# Patient Record
Sex: Female | Born: 1955
Health system: Southern US, Community
[De-identification: ages and names within clinical notes are randomized; demographics above are authoritative.]

## PROBLEM LIST (undated history)

## (undated) DIAGNOSIS — G039 Meningitis, unspecified: Secondary | ICD-10-CM

## (undated) DIAGNOSIS — M5416 Radiculopathy, lumbar region: Secondary | ICD-10-CM

## (undated) DIAGNOSIS — J449 Chronic obstructive pulmonary disease, unspecified: Secondary | ICD-10-CM

## (undated) DIAGNOSIS — M549 Dorsalgia, unspecified: Secondary | ICD-10-CM

## (undated) DIAGNOSIS — M5030 Other cervical disc degeneration, unspecified cervical region: Secondary | ICD-10-CM

## (undated) DIAGNOSIS — Z923 Personal history of irradiation: Secondary | ICD-10-CM

## (undated) DIAGNOSIS — I5032 Chronic diastolic (congestive) heart failure: Secondary | ICD-10-CM

## (undated) DIAGNOSIS — I493 Ventricular premature depolarization: Secondary | ICD-10-CM

## (undated) DIAGNOSIS — R0789 Other chest pain: Secondary | ICD-10-CM

## (undated) DIAGNOSIS — R002 Palpitations: Secondary | ICD-10-CM

## (undated) DIAGNOSIS — G8929 Other chronic pain: Secondary | ICD-10-CM

## (undated) DIAGNOSIS — C801 Malignant (primary) neoplasm, unspecified: Secondary | ICD-10-CM

## (undated) DIAGNOSIS — M1990 Unspecified osteoarthritis, unspecified site: Secondary | ICD-10-CM

## (undated) DIAGNOSIS — I491 Atrial premature depolarization: Secondary | ICD-10-CM

## (undated) DIAGNOSIS — I10 Essential (primary) hypertension: Secondary | ICD-10-CM

## (undated) DIAGNOSIS — E785 Hyperlipidemia, unspecified: Secondary | ICD-10-CM

## (undated) DIAGNOSIS — E119 Type 2 diabetes mellitus without complications: Secondary | ICD-10-CM

## (undated) DIAGNOSIS — Z0389 Encounter for observation for other suspected diseases and conditions ruled out: Secondary | ICD-10-CM

## (undated) HISTORY — DX: Chronic diastolic (congestive) heart failure: I50.32

## (undated) HISTORY — PX: ABDOMINAL HYSTERECTOMY: SHX81

## (undated) HISTORY — DX: Ventricular premature depolarization: I49.3

## (undated) HISTORY — DX: Personal history of irradiation: Z92.3

## (undated) HISTORY — DX: Encounter for observation for other suspected diseases and conditions ruled out: Z03.89

## (undated) HISTORY — DX: Atrial premature depolarization: I49.1

---

## 1998-01-11 ENCOUNTER — Emergency Department (HOSPITAL_COMMUNITY): Admission: EM | Admit: 1998-01-11 | Discharge: 1998-01-11 | Payer: Self-pay | Admitting: Emergency Medicine

## 1999-10-01 ENCOUNTER — Encounter: Payer: Self-pay | Admitting: Cardiology

## 1999-10-01 ENCOUNTER — Ambulatory Visit (HOSPITAL_COMMUNITY): Admission: RE | Admit: 1999-10-01 | Discharge: 1999-10-01 | Payer: Self-pay | Admitting: Cardiology

## 1999-10-11 ENCOUNTER — Ambulatory Visit (HOSPITAL_COMMUNITY): Admission: RE | Admit: 1999-10-11 | Discharge: 1999-10-11 | Payer: Self-pay | Admitting: Cardiology

## 1999-10-12 ENCOUNTER — Encounter: Payer: Self-pay | Admitting: Cardiology

## 1999-10-12 ENCOUNTER — Ambulatory Visit (HOSPITAL_COMMUNITY): Admission: RE | Admit: 1999-10-12 | Discharge: 1999-10-12 | Payer: Self-pay | Admitting: Cardiology

## 1999-10-13 ENCOUNTER — Encounter: Payer: Self-pay | Admitting: Cardiology

## 1999-10-19 ENCOUNTER — Other Ambulatory Visit: Admission: RE | Admit: 1999-10-19 | Discharge: 1999-10-19 | Payer: Self-pay | Admitting: Cardiology

## 1999-10-19 ENCOUNTER — Encounter (INDEPENDENT_AMBULATORY_CARE_PROVIDER_SITE_OTHER): Payer: Self-pay | Admitting: Specialist

## 1999-10-19 ENCOUNTER — Encounter: Payer: Self-pay | Admitting: Cardiology

## 1999-10-19 ENCOUNTER — Ambulatory Visit (HOSPITAL_COMMUNITY): Admission: RE | Admit: 1999-10-19 | Discharge: 1999-10-19 | Payer: Self-pay | Admitting: Cardiology

## 1999-11-09 ENCOUNTER — Encounter (INDEPENDENT_AMBULATORY_CARE_PROVIDER_SITE_OTHER): Payer: Self-pay | Admitting: *Deleted

## 1999-11-09 ENCOUNTER — Inpatient Hospital Stay (HOSPITAL_COMMUNITY): Admission: RE | Admit: 1999-11-09 | Discharge: 1999-11-11 | Payer: Self-pay | Admitting: General Surgery

## 1999-11-14 ENCOUNTER — Emergency Department (HOSPITAL_COMMUNITY): Admission: EM | Admit: 1999-11-14 | Discharge: 1999-11-14 | Payer: Self-pay | Admitting: Emergency Medicine

## 1999-11-14 ENCOUNTER — Encounter: Payer: Self-pay | Admitting: Emergency Medicine

## 1999-12-17 ENCOUNTER — Encounter (HOSPITAL_BASED_OUTPATIENT_CLINIC_OR_DEPARTMENT_OTHER): Payer: Self-pay | Admitting: General Surgery

## 1999-12-17 ENCOUNTER — Ambulatory Visit (HOSPITAL_COMMUNITY): Admission: RE | Admit: 1999-12-17 | Discharge: 1999-12-17 | Payer: Self-pay | Admitting: General Surgery

## 2000-04-18 HISTORY — PX: THYROID SURGERY: SHX805

## 2000-08-13 ENCOUNTER — Emergency Department (HOSPITAL_COMMUNITY): Admission: EM | Admit: 2000-08-13 | Discharge: 2000-08-13 | Payer: Self-pay | Admitting: Internal Medicine

## 2000-11-30 ENCOUNTER — Ambulatory Visit (HOSPITAL_COMMUNITY): Admission: RE | Admit: 2000-11-30 | Discharge: 2000-11-30 | Payer: Self-pay | Admitting: Internal Medicine

## 2000-11-30 ENCOUNTER — Encounter: Payer: Self-pay | Admitting: Internal Medicine

## 2001-09-15 ENCOUNTER — Emergency Department (HOSPITAL_COMMUNITY): Admission: EM | Admit: 2001-09-15 | Discharge: 2001-09-15 | Payer: Self-pay | Admitting: *Deleted

## 2001-09-15 ENCOUNTER — Encounter: Payer: Self-pay | Admitting: Emergency Medicine

## 2001-09-18 ENCOUNTER — Inpatient Hospital Stay (HOSPITAL_COMMUNITY): Admission: AD | Admit: 2001-09-18 | Discharge: 2001-09-27 | Payer: Self-pay | Admitting: Internal Medicine

## 2001-09-21 ENCOUNTER — Encounter: Payer: Self-pay | Admitting: Internal Medicine

## 2001-09-23 ENCOUNTER — Encounter: Payer: Self-pay | Admitting: Family Medicine

## 2001-09-24 ENCOUNTER — Encounter (INDEPENDENT_AMBULATORY_CARE_PROVIDER_SITE_OTHER): Payer: Self-pay | Admitting: Specialist

## 2001-09-25 ENCOUNTER — Encounter: Payer: Self-pay | Admitting: Internal Medicine

## 2001-10-04 ENCOUNTER — Encounter: Admission: RE | Admit: 2001-10-04 | Discharge: 2001-10-04 | Payer: Self-pay | Admitting: Internal Medicine

## 2001-10-15 ENCOUNTER — Encounter: Admission: RE | Admit: 2001-10-15 | Discharge: 2001-10-15 | Payer: Self-pay | Admitting: Internal Medicine

## 2001-10-22 ENCOUNTER — Encounter: Admission: RE | Admit: 2001-10-22 | Discharge: 2001-10-22 | Payer: Self-pay | Admitting: Internal Medicine

## 2003-05-14 ENCOUNTER — Emergency Department (HOSPITAL_COMMUNITY): Admission: EM | Admit: 2003-05-14 | Discharge: 2003-05-14 | Payer: Self-pay | Admitting: Emergency Medicine

## 2003-05-22 ENCOUNTER — Encounter: Admission: RE | Admit: 2003-05-22 | Discharge: 2003-05-22 | Payer: Self-pay | Admitting: Nephrology

## 2003-08-04 ENCOUNTER — Encounter: Admission: RE | Admit: 2003-08-04 | Discharge: 2003-08-04 | Payer: Self-pay | Admitting: Orthopedic Surgery

## 2003-08-17 ENCOUNTER — Emergency Department (HOSPITAL_COMMUNITY): Admission: EM | Admit: 2003-08-17 | Discharge: 2003-08-17 | Payer: Self-pay | Admitting: Emergency Medicine

## 2003-08-28 ENCOUNTER — Encounter (HOSPITAL_COMMUNITY): Admission: RE | Admit: 2003-08-28 | Discharge: 2003-09-27 | Payer: Self-pay | Admitting: Orthopedic Surgery

## 2003-10-20 ENCOUNTER — Emergency Department (HOSPITAL_COMMUNITY): Admission: EM | Admit: 2003-10-20 | Discharge: 2003-10-20 | Payer: Self-pay | Admitting: *Deleted

## 2003-10-25 ENCOUNTER — Emergency Department (HOSPITAL_COMMUNITY): Admission: EM | Admit: 2003-10-25 | Discharge: 2003-10-25 | Payer: Self-pay | Admitting: Emergency Medicine

## 2003-11-12 ENCOUNTER — Encounter (HOSPITAL_COMMUNITY): Admission: RE | Admit: 2003-11-12 | Discharge: 2003-12-12 | Payer: Self-pay | Admitting: Orthopedic Surgery

## 2003-12-15 ENCOUNTER — Encounter (HOSPITAL_COMMUNITY): Admission: RE | Admit: 2003-12-15 | Discharge: 2004-01-14 | Payer: Self-pay | Admitting: Orthopedic Surgery

## 2004-07-26 ENCOUNTER — Emergency Department (HOSPITAL_COMMUNITY): Admission: EM | Admit: 2004-07-26 | Discharge: 2004-07-26 | Payer: Self-pay | Admitting: Emergency Medicine

## 2004-08-18 ENCOUNTER — Ambulatory Visit (HOSPITAL_COMMUNITY): Admission: RE | Admit: 2004-08-18 | Discharge: 2004-08-18 | Payer: Self-pay | Admitting: Urology

## 2005-05-12 ENCOUNTER — Emergency Department: Payer: Self-pay | Admitting: General Practice

## 2005-05-14 ENCOUNTER — Inpatient Hospital Stay (HOSPITAL_COMMUNITY): Admission: EM | Admit: 2005-05-14 | Discharge: 2005-05-17 | Payer: Self-pay | Admitting: Emergency Medicine

## 2005-05-16 ENCOUNTER — Ambulatory Visit: Payer: Self-pay | Admitting: Internal Medicine

## 2005-05-18 ENCOUNTER — Ambulatory Visit: Payer: Self-pay | Admitting: Family Medicine

## 2005-05-18 ENCOUNTER — Ambulatory Visit (HOSPITAL_COMMUNITY): Admission: RE | Admit: 2005-05-18 | Discharge: 2005-05-18 | Payer: Self-pay | Admitting: Family Medicine

## 2005-05-20 ENCOUNTER — Emergency Department (HOSPITAL_COMMUNITY): Admission: EM | Admit: 2005-05-20 | Discharge: 2005-05-20 | Payer: Self-pay | Admitting: Emergency Medicine

## 2005-05-26 ENCOUNTER — Ambulatory Visit (HOSPITAL_COMMUNITY): Admission: RE | Admit: 2005-05-26 | Discharge: 2005-05-26 | Payer: Self-pay | Admitting: Family Medicine

## 2005-05-26 ENCOUNTER — Encounter (HOSPITAL_COMMUNITY): Admission: RE | Admit: 2005-05-26 | Discharge: 2005-06-25 | Payer: Self-pay | Admitting: Family Medicine

## 2005-05-30 ENCOUNTER — Ambulatory Visit (HOSPITAL_COMMUNITY): Admission: RE | Admit: 2005-05-30 | Discharge: 2005-05-30 | Payer: Self-pay | Admitting: Family Medicine

## 2005-06-01 ENCOUNTER — Ambulatory Visit: Payer: Self-pay | Admitting: Family Medicine

## 2005-06-14 ENCOUNTER — Ambulatory Visit: Payer: Self-pay | Admitting: Internal Medicine

## 2005-06-22 ENCOUNTER — Ambulatory Visit: Payer: Self-pay | Admitting: Family Medicine

## 2005-06-27 ENCOUNTER — Encounter (HOSPITAL_COMMUNITY): Admission: RE | Admit: 2005-06-27 | Discharge: 2005-07-27 | Payer: Self-pay | Admitting: Family Medicine

## 2005-07-21 ENCOUNTER — Ambulatory Visit: Payer: Self-pay | Admitting: Internal Medicine

## 2005-07-29 ENCOUNTER — Encounter (HOSPITAL_COMMUNITY): Admission: RE | Admit: 2005-07-29 | Discharge: 2005-08-28 | Payer: Self-pay | Admitting: Internal Medicine

## 2005-08-11 ENCOUNTER — Ambulatory Visit: Payer: Self-pay | Admitting: Internal Medicine

## 2005-08-29 ENCOUNTER — Encounter (HOSPITAL_COMMUNITY): Admission: RE | Admit: 2005-08-29 | Discharge: 2005-09-28 | Payer: Self-pay | Admitting: Internal Medicine

## 2005-09-14 ENCOUNTER — Ambulatory Visit: Payer: Self-pay | Admitting: Internal Medicine

## 2005-10-31 ENCOUNTER — Ambulatory Visit: Payer: Self-pay | Admitting: Internal Medicine

## 2005-11-24 ENCOUNTER — Ambulatory Visit: Payer: Self-pay | Admitting: Internal Medicine

## 2005-12-22 ENCOUNTER — Ambulatory Visit: Payer: Self-pay | Admitting: Internal Medicine

## 2005-12-29 ENCOUNTER — Ambulatory Visit (HOSPITAL_COMMUNITY): Admission: RE | Admit: 2005-12-29 | Discharge: 2005-12-29 | Payer: Self-pay | Admitting: Internal Medicine

## 2006-01-18 ENCOUNTER — Ambulatory Visit (HOSPITAL_COMMUNITY): Admission: RE | Admit: 2006-01-18 | Discharge: 2006-01-18 | Payer: Self-pay | Admitting: Internal Medicine

## 2006-05-23 ENCOUNTER — Encounter: Payer: Self-pay | Admitting: Internal Medicine

## 2006-05-23 DIAGNOSIS — I10 Essential (primary) hypertension: Secondary | ICD-10-CM

## 2006-05-23 DIAGNOSIS — E039 Hypothyroidism, unspecified: Secondary | ICD-10-CM

## 2006-05-23 DIAGNOSIS — M1990 Unspecified osteoarthritis, unspecified site: Secondary | ICD-10-CM | POA: Insufficient documentation

## 2006-06-14 ENCOUNTER — Telehealth (INDEPENDENT_AMBULATORY_CARE_PROVIDER_SITE_OTHER): Payer: Self-pay | Admitting: *Deleted

## 2006-08-25 ENCOUNTER — Emergency Department (HOSPITAL_COMMUNITY): Admission: EM | Admit: 2006-08-25 | Discharge: 2006-08-25 | Payer: Self-pay | Admitting: Emergency Medicine

## 2006-10-03 ENCOUNTER — Emergency Department (HOSPITAL_COMMUNITY): Admission: EM | Admit: 2006-10-03 | Discharge: 2006-10-03 | Payer: Self-pay | Admitting: Emergency Medicine

## 2006-10-10 ENCOUNTER — Ambulatory Visit: Payer: Self-pay | Admitting: Internal Medicine

## 2006-10-10 DIAGNOSIS — J13 Pneumonia due to Streptococcus pneumoniae: Secondary | ICD-10-CM | POA: Insufficient documentation

## 2006-10-10 DIAGNOSIS — J9801 Acute bronchospasm: Secondary | ICD-10-CM | POA: Insufficient documentation

## 2006-10-11 ENCOUNTER — Telehealth (INDEPENDENT_AMBULATORY_CARE_PROVIDER_SITE_OTHER): Payer: Self-pay | Admitting: Internal Medicine

## 2006-10-12 ENCOUNTER — Ambulatory Visit: Payer: Self-pay | Admitting: Internal Medicine

## 2007-04-19 ENCOUNTER — Encounter: Payer: Self-pay | Admitting: Family Medicine

## 2007-06-29 ENCOUNTER — Ambulatory Visit: Payer: Self-pay | Admitting: Internal Medicine

## 2007-07-02 ENCOUNTER — Telehealth (INDEPENDENT_AMBULATORY_CARE_PROVIDER_SITE_OTHER): Payer: Self-pay | Admitting: *Deleted

## 2007-07-02 LAB — CONVERTED CEMR LAB
CO2: 24 meq/L (ref 19–32)
Calcium: 9.4 mg/dL (ref 8.4–10.5)
Free T4: 0.8 ng/dL — ABNORMAL LOW (ref 0.89–1.80)
Sodium: 141 meq/L (ref 135–145)
TSH: 4.275 microintl units/mL (ref 0.350–5.50)

## 2007-10-01 ENCOUNTER — Encounter (INDEPENDENT_AMBULATORY_CARE_PROVIDER_SITE_OTHER): Payer: Self-pay | Admitting: Internal Medicine

## 2007-10-28 ENCOUNTER — Emergency Department (HOSPITAL_COMMUNITY): Admission: EM | Admit: 2007-10-28 | Discharge: 2007-10-28 | Payer: Self-pay | Admitting: Emergency Medicine

## 2008-01-14 ENCOUNTER — Encounter (INDEPENDENT_AMBULATORY_CARE_PROVIDER_SITE_OTHER): Payer: Self-pay | Admitting: Internal Medicine

## 2008-01-18 ENCOUNTER — Ambulatory Visit: Payer: Self-pay | Admitting: Internal Medicine

## 2008-01-21 LAB — CONVERTED CEMR LAB
Cholesterol: 143 mg/dL (ref 0–200)
Potassium: 4.1 meq/L (ref 3.5–5.3)
Sodium: 140 meq/L (ref 135–145)
Total CHOL/HDL Ratio: 3.3
Triglycerides: 121 mg/dL (ref <150)
VLDL: 24 mg/dL (ref 0–40)

## 2008-10-28 ENCOUNTER — Emergency Department (HOSPITAL_COMMUNITY): Admission: EM | Admit: 2008-10-28 | Discharge: 2008-10-28 | Payer: Self-pay | Admitting: Emergency Medicine

## 2008-11-21 ENCOUNTER — Ambulatory Visit: Payer: Self-pay | Admitting: Internal Medicine

## 2009-02-09 ENCOUNTER — Emergency Department (HOSPITAL_COMMUNITY): Admission: EM | Admit: 2009-02-09 | Discharge: 2009-02-09 | Payer: Self-pay | Admitting: Emergency Medicine

## 2010-05-18 NOTE — Letter (Signed)
Summary: rpc chart  rpc chart   Imported By: Curtis Sites 01/18/2010 09:47:02  _____________________________________________________________________  External Attachment:    Type:   Image     Comment:   External Document

## 2010-07-18 ENCOUNTER — Emergency Department (HOSPITAL_COMMUNITY): Payer: Self-pay

## 2010-07-18 ENCOUNTER — Emergency Department (HOSPITAL_COMMUNITY)
Admission: EM | Admit: 2010-07-18 | Discharge: 2010-07-18 | Disposition: A | Discharge status: Home / Self Care | Payer: Self-pay | Attending: Emergency Medicine | Admitting: Emergency Medicine

## 2010-07-18 DIAGNOSIS — R51 Headache: Secondary | ICD-10-CM | POA: Insufficient documentation

## 2010-07-18 DIAGNOSIS — Z79899 Other long term (current) drug therapy: Secondary | ICD-10-CM | POA: Insufficient documentation

## 2010-07-18 DIAGNOSIS — I10 Essential (primary) hypertension: Secondary | ICD-10-CM | POA: Insufficient documentation

## 2010-09-03 NOTE — Discharge Summary (Signed)
[facility]  Patient:    Hannah Cooper, Hannah Cooper Visit Number: [id number] MRN: [id number]          Service Type: MED Location: 2A A228 01 Attending Physician:  Cassell Smiles. Dictated by:   Elfredia Nevins, M.D. Admit Date:  09/18/2001 Discharge Date: 09/27/2001                             Discharge Summary  DISCHARGE DIAGNOSIS:  Transfer to [facility] Infectious Disease Department for rule out other pathology (admission diagnosis and discharge diagnosis are aseptic meningitis).  DISCHARGE SUMMARY:  The patient was admitted with severe unrelenting headache, initially evaluated in the emergency department at another institution. She underwent no diagnostic lumbar puncture, presented to the office with persistent increasing headache as well as fever. Initially, she had no focal neurologic signs. She was admitted after I performed a lumbar puncture and found her to have classic cellular pleocytosis consistent with aseptic meningitis. Although she remained stable in the hospital, she eventually developed right-sided twitching and tremulousness by her description although this was not witnessed by a physician. She did respond to analgesics. She was empirically covered with tetracycline antibiotic for possibility of RMSF, although doubtful. Due to lack of rapid improvement and persistent fever spikes as well as headache, she was subsequently discussed in consultation with Dr. Darlina Sicilian, infectious disease, in [employer] Transfer was subsequently accomplished for infectious disease evaluation.  DISPOSITION:  On the day of discharge, she was neurologically and hemodynamically stable. Dictated by:   Elfredia Nevins, M.D. Attending Physician:  Cassell Smiles DD:  10/16/01 TD:  10/17/01 Job: [id number]
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
## 2010-09-03 NOTE — H&P (Signed)
[facility]  Patient:    Hannah Cooper, Hannah Cooper Visit Number: [id number] MRN: [id number]          Service Type: MED Location: 2A A228 01 Attending Physician:  Cassell Smiles. Dictated by:   Elfredia Nevins, M.D. Admit Date:  09/18/2001                           History and Physical  DATE OF BIRTH:  1955-06-27  CHIEF COMPLAINT:  Severe headache.  HISTORY OF PRESENT ILLNESS:  Approximately four or five days prior to office visitation the patient developed a gradually increasing severe headache.  It has been accompanied by nausea and vomiting.  No abdominal pain.  She has had dizziness, lightheadedness, and now thirst since she is unable to take any significant fluid or solid intake.  She has had no hematemesis, hematochezia, or melena.  No skin rash.  She does frequent the outdoors; however, there was no notable tick bite.  She visited [facility] Emergency Department on Saturday and underwent a computerized tomogram of the brain which was reportedly negative.  I will review that report when it is available.  She also had blood work obtained which was reportedly normal.  Her presumptive diagnosis given at the time of discharge, according to patient history, is a viral syndrome. Specifically, no lumbar puncture was performed.  They told her definitely there was no evidence of hemorrhage.  She persisted in her symptoms and presents to the office with no improvement and, in fact, possibly some worsening of symptoms.  She does admit to photophobia.  Her neck does hurt with flexion.  PAST MEDICAL HISTORY:   Status post partial hysterectomy.  SOCIAL HISTORY:  Is a Therapist, nutritional.  No smoking or alcohol use.  No substance abuse.  FAMILY HISTORY:  Positive for hypertension.  Mother deceased with polio, myocardial infarction, and hypertension.  Carcinoma in paternal grandparents. Maternal grandmother has coronary artery disease.  Spouse has  diabetes mellitus.  She has one sister who has hypertension as well.  REVIEW OF SYSTEMS:  Under HPI.  All else is negative.  PHYSICAL EXAMINATION:  GENERAL:  Moderately photophobic and appears toxic.  HEENT, NECK:  No JVD or adenopathy.  Her neck shows subjective stiffness, but no true nuchal rigidity.  No adenopathy.  No masses.  Oropharyngeal exam shows dry mucous membranes, injected oropharynx without enanthem.  CHEST:  Clear.  CARDIAC:  Regular rhythm without murmur, gallop, or rub.  ABDOMEN:  Soft in all quadrants.  No organomegaly or masses.  EXTREMITIES:  Without clubbing, cyanosis, or edema.  NEUROLOGIC:  Other than her photophobia, shows no focal findings.  IMPRESSION AND PLAN:  The patient has an unrelenting headache suggestive of viral meningitis.  She will be admitted for hydration, adequate analgesia, and control of her pain.  I believe at this point a lumbar puncture is indicated. [facility] spotted fever serology will be drawn.  Laboratories from the previous hospital visitation at [facility] will be reviewed. Dictated by:   Elfredia Nevins, M.D. Attending Physician:  Cassell Smiles DD:  09/18/01 TD:  09/19/01 Job: [id number]

## 2010-09-03 NOTE — Consult Note (Signed)
[facility]  Patient:    GODDESS, GEBBIA Visit Number: [id number] MRN: [id number]          Service Type: MED Location: 2A A228 01 Attending Physician:  Cassell Smiles. Dictated by:   Beryle Beams, M.D. Admit Date:  09/18/2001                            Consultation Report  DATE OF BIRTH:  May 08, 1955  IMPRESSION:  Clinically, the patient is most consistent with viral aseptic meningitis.  Other potential etiologies to consider includes cryptococcal meningitis.  HISTORY OF PRESENT ILLNESS:  The patient is a 55 year old lady who developed a moderate headache about 6 days ago.  She went to bed and woke up the next morning with severe headaches.  She attempted to continue with her activities but was not feeling well.  She also reported having chest pain and eventually developed a fever of 101.  She was seen at [facility] Emergency Department, had a computed tomography of the brain apparently it was negative and was discharged with pain medications and diagnosis of a viral syndrome and apparently no lumbar puncture was done.  She went to see her primary care physician and was admitted here for further evaluation.  A spinal tap was carried out and showed what was positive for high wbcs indicating meningitis. The result of a CT scan done at [facility] has been apparently not available. The results of the spinal tap showed tube #1 wbcs of 776, tube #4 wbcs of 1333.  The wbcs in tube #1 was 57 and tube #2 is 256.  Neutrophils differential in #1 was 3%, #4 - 1%.  The lymphocytes were 91-92% and also the monocytes were 5 and 6%.  Glucose was 42, protein 85.  The glucose that was done around that time was 138.  Gram stain showed no organism, moderate wbcs were seen.  She did have titers for [facility] spotted fever which apparently are pending at this time.  She complains of severe headaches 10/10 which are relieved with pain medication to  about 7/10.  She complains of severe photophobia.  PAST MEDICAL HISTORY:  Status post partial hysterectomy.  History of thyroid disease, she is status post thyroidectomy - she apparently is on steroid medications for this condition.  Also taking a fluid medication.  SOCIAL HISTORY:  She works as a Engineering geologist.  No substance abuse.  No alcohol or tobacco use.  FAMILY HISTORY:  Hypertension, history of myocardial infarction.  Also history of carcinoma in the grandparents.  REVIEW OF SYSTEMS:  As stated above.  PHYSICAL EXAMINATION:  Moderately overweight lady who is in obvious discomfort from headaches, the lights are out, has a towel around her head.  She is awake and oriented.  She has coherent speech.  Language and cognition are intact. Cranial nerve reveals that pupils are equal, round, reactive to light and accommodation.  Extraocular movements are full.  Visual fields are full. Facial muscle strength is normal.  Tongue and uvula are both midline. Shoulder shrugs are normal.  Visual fields are intact.  Motor examination shows normal tone, bulk, and strength.  There is no pronator drift. Coordination is intact.  Reflexes are +2.  Plantar reflexes both downgoing. Sensory examination normal to temperature, light touch, and double simultaneous stimulation.  Gait is not tested.  Thanks for this consultation. Dictated by:   Beryle Beams, M.D. Attending Physician:  Cassell Smiles DD:  09/20/01 TD:  09/23/01 Job: [id number]
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
## 2010-09-03 NOTE — Op Note (Signed)
[place]. [facility]  Patient:    Hannah Cooper, Hannah Cooper                    MRN: [id number] Proc. Date: 11/09/99 Attending:  Luisa Hart L. Lurene Shadow, M.D. CC:         Mardene Celeste. Lurene Shadow, M.D. (2)                           Operative Report  PREOPERATIVE DIAGNOSIS:  Left thyroid nodule.  POSTOPERATIVE DIAGNOSIS:  Multinodular goiter.  OPERATION PERFORMED:  Near total thyroidectomy.  SURGEON:  Mardene Celeste. Lurene Shadow, M.D.  ASSISTANT:  Marnee Spring. Wiliam Ke, M.D.  ANESTHESIA:  General.  INDICATIONS FOR PROCEDURE:  The patient is a 55 year old woman presenting with a very large left thyroid nodule, no associated hypothyroidism, who was brought to the operating room after scan showing a cold nodule to the left lobe of the thyroid.  DESCRIPTION OF PROCEDURE:  Following induction of satisfactory general anesthesia with the patient positioned supinely, the head and neck hyperextended, the neck was prepped and draped to be included in a sterile operative field.  A collar incision placed two fingerbreadths above the sternal notch was deepened through the skin and subcutaneous tissues and carried to the platysma down to the strap muscles.  The strap muscles were opened in the midline and dissection carried down to the thyroid.  The left thyroid was approached and explored and dissection carried down to the inferior pole of the thyroid where the recurrent laryngeal nerve was positively identified and protected throughout the course of dissection. Dissection was carried up along the thyroid staying close onto the capsule and avoiding the recurrent laryngeal nerve all the way up to the upper pole of the thyroid.  The upper pole vessels were doubly clamped and transected and the thyroid was then dissected free including the isthmus and pyramidal lobe over to the right side.  The thyroid was then transected between clamps and secured with ties of 3-0 Vicryl.  The thyroid left lobe and  isthmus were forwarded for pathologic evaluation.  The pathologic report showed multinodular goiter.  The right thyroid lobe was palpated and there were multiple nodules noted within the right thyroid lobe and the right thyroid lobe was then dissected by taking down some of the ____________ vessels and releasing the upper pole and dissecting the right thyroid from the midline towards the right.  The recurrent laryngeal nerve on the right was avoided and the thyroid as well as the parathyroids on the right side were avoided and partial lobectomy leaving a rim of thyroid over the recurrent laryngeal nerve and the parathyroids was carried out and the thyroid secured with suture ligatures of 3-0 Vicryl. This portion of the specimen was forwarded for permanent sections.  Sponge, instrument and sharp counts were verified.  The wounds thoroughly irrigated with saline.  All areas of dissection checked for hemostasis and noted to be excellent.  The midline strap muscles were then closed with a running suture of 3-0 Vicryl.  The platysma and subcutaneous tissues were closed with interrupted 3-0 Vicryl sutures and the skin closed with running 5-0 Monocryl. The wound was then reinforced with Steri-Strips.  Sterile dressings applied. Anesthetic reversed.  Patient removed from the operating room to the recovery room in stable condition having tolerated the procedure well. DD:  11/09/99 TD:  11/10/99 Job: [id number]

## 2011-01-13 LAB — STREP A DNA PROBE

## 2011-01-22 ENCOUNTER — Observation Stay (HOSPITAL_COMMUNITY)
Admission: EM | Admit: 2011-01-22 | Discharge: 2011-01-24 | Disposition: A | Discharge status: Home / Self Care | Payer: Medicaid Other | Attending: Internal Medicine | Admitting: Internal Medicine

## 2011-01-22 ENCOUNTER — Encounter: Payer: Self-pay | Admitting: Emergency Medicine

## 2011-01-22 DIAGNOSIS — E039 Hypothyroidism, unspecified: Secondary | ICD-10-CM

## 2011-01-22 DIAGNOSIS — J9801 Acute bronchospasm: Secondary | ICD-10-CM

## 2011-01-22 DIAGNOSIS — I10 Essential (primary) hypertension: Secondary | ICD-10-CM

## 2011-01-22 DIAGNOSIS — A879 Viral meningitis, unspecified: Principal | ICD-10-CM

## 2011-01-22 DIAGNOSIS — R112 Nausea with vomiting, unspecified: Secondary | ICD-10-CM | POA: Insufficient documentation

## 2011-01-22 DIAGNOSIS — R1110 Vomiting, unspecified: Secondary | ICD-10-CM

## 2011-01-22 DIAGNOSIS — J13 Pneumonia due to Streptococcus pneumoniae: Secondary | ICD-10-CM

## 2011-01-22 DIAGNOSIS — M1990 Unspecified osteoarthritis, unspecified site: Secondary | ICD-10-CM

## 2011-01-22 DIAGNOSIS — R51 Headache: Secondary | ICD-10-CM

## 2011-01-22 DIAGNOSIS — IMO0001 Reserved for inherently not codable concepts without codable children: Secondary | ICD-10-CM | POA: Insufficient documentation

## 2011-01-22 HISTORY — DX: Meningitis, unspecified: G03.9

## 2011-01-22 LAB — URINALYSIS, ROUTINE W REFLEX MICROSCOPIC
Bilirubin Urine: NEGATIVE
Hgb urine dipstick: NEGATIVE
Protein, ur: NEGATIVE mg/dL
Urobilinogen, UA: 0.2 mg/dL (ref 0.0–1.0)

## 2011-01-22 LAB — BASIC METABOLIC PANEL
Calcium: 9.3 mg/dL (ref 8.4–10.5)
GFR calc non Af Amer: >90 mL/min (ref >90)
Glucose, Bld: 92 mg/dL (ref 70–99)
Sodium: 138 mEq/L (ref 135–145)

## 2011-01-22 LAB — CSF CELL COUNT WITH DIFFERENTIAL
Eosinophils, CSF: 0 % (ref 0–1)
Monocyte-Macrophage-Spinal Fluid: 11 % — ABNORMAL LOW (ref 15–45)
RBC Count, CSF: 10 /mm3 — ABNORMAL HIGH
Tube #: 1
Tube #: 4
WBC, CSF: 1 /mm3 (ref 0–5)

## 2011-01-22 LAB — CBC
MCH: 29.7 pg (ref 26.0–34.0)
MCHC: 33.4 g/dL (ref 30.0–36.0)
MCV: 88.8 fL (ref 78.0–100.0)
Platelets: 258 10*3/uL (ref 150–400)
RBC: 4.18 MIL/uL (ref 3.87–5.11)
RDW: 13.4 % (ref 11.5–15.5)

## 2011-01-22 LAB — DIFFERENTIAL
Basophils Relative: 0 % (ref 0–1)
Eosinophils Absolute: 0.1 10*3/uL (ref 0.0–0.7)
Eosinophils Relative: 1 % (ref 0–5)
Lymphs Abs: 1.7 10*3/uL (ref 0.7–4.0)

## 2011-01-22 LAB — GRAM STAIN

## 2011-01-22 MED ORDER — SENNA 8.6 MG PO TABS: 2.0000 | ORAL_TABLET | Freq: Every day | ORAL | Status: DC | PRN

## 2011-01-22 MED ORDER — LIDOCAINE HCL (PF) 2 % IJ SOLN
INTRAMUSCULAR | Status: AC
  Filled 2011-01-22: qty 10

## 2011-01-22 MED ORDER — PROMETHAZINE HCL 12.5 MG PO TABS: 12.5000 mg | ORAL_TABLET | Freq: Four times a day (QID) | ORAL | Status: DC | PRN

## 2011-01-22 MED ORDER — PROMETHAZINE HCL 25 MG/ML IJ SOLN
12.5000 mg | Freq: Once | INTRAMUSCULAR | Status: AC
  Administered 2011-01-22: 12.5 mg via INTRAVENOUS
  Filled 2011-01-22: qty 1

## 2011-01-22 MED ORDER — BISACODYL 10 MG RE SUPP: 10.0000 mg | Freq: Every day | RECTAL | Status: DC | PRN

## 2011-01-22 MED ORDER — ONDANSETRON HCL 4 MG/2ML IJ SOLN
INTRAMUSCULAR | Status: AC
  Administered 2011-01-22: 4 mg
  Filled 2011-01-22: qty 2

## 2011-01-22 MED ORDER — ONDANSETRON HCL 4 MG/2ML IJ SOLN
4.0000 mg | Freq: Once | INTRAMUSCULAR | Status: AC
  Administered 2011-01-22: 4 mg via INTRAVENOUS
  Filled 2011-01-22: qty 2

## 2011-01-22 MED ORDER — MORPHINE SULFATE 4 MG/ML IJ SOLN
4.0000 mg | Freq: Once | INTRAMUSCULAR | Status: AC
  Administered 2011-01-22: 4 mg via INTRAVENOUS
  Filled 2011-01-22: qty 1

## 2011-01-22 MED ORDER — KCL IN DEXTROSE-NACL 20-5-0.9 MEQ/L-%-% IV SOLN
INTRAVENOUS | Status: DC
  Administered 2011-01-22 – 2011-01-23 (×2): via INTRAVENOUS

## 2011-01-22 MED ORDER — MORPHINE SULFATE 2 MG/ML IJ SOLN
4.0000 mg | INTRAMUSCULAR | Status: DC | PRN
  Administered 2011-01-22: 4 mg via INTRAVENOUS
  Filled 2011-01-22: qty 2

## 2011-01-22 MED ORDER — ALUM & MAG HYDROXIDE-SIMETH 200-200-20 MG/5ML PO SUSP: 30.0000 mL | Freq: Four times a day (QID) | ORAL | Status: DC | PRN

## 2011-01-22 MED ORDER — PROMETHAZINE HCL 25 MG/ML IJ SOLN: 12.5000 mg | Freq: Four times a day (QID) | INTRAMUSCULAR | Status: DC | PRN

## 2011-01-22 MED ORDER — ACETAMINOPHEN 650 MG RE SUPP: 650.0000 mg | Freq: Four times a day (QID) | RECTAL | Status: DC | PRN

## 2011-01-22 MED ORDER — ACETAMINOPHEN 325 MG PO TABS
650.0000 mg | ORAL_TABLET | Freq: Four times a day (QID) | ORAL | Status: DC | PRN
  Filled 2011-01-22: qty 2

## 2011-01-22 MED ORDER — SODIUM CHLORIDE 0.9 % IV BOLUS (SEPSIS): 1000.0000 mL | Freq: Once | INTRAVENOUS | Status: DC

## 2011-01-22 MED ORDER — HYDROMORPHONE HCL 1 MG/ML IJ SOLN
1.0000 mg | Freq: Once | INTRAMUSCULAR | Status: AC
  Administered 2011-01-22: 1 mg via INTRAVENOUS
  Filled 2011-01-22: qty 1

## 2011-01-22 NOTE — ED Notes (Signed)
Assisted with lumbar puncture--Pt. Tolerated well--Specimens collected and hand delivered to lab.

## 2011-01-22 NOTE — ED Notes (Addendum)
B/P 156/70  HR 58  P.O. 98% on room air, Oral Temp 99--C/o headache and rates pain 6 on 1-10 scale.

## 2011-01-22 NOTE — ED Notes (Signed)
B/P retaken 158/80

## 2011-01-22 NOTE — ED Notes (Signed)
Hospitalist here and in to see pt.

## 2011-01-22 NOTE — ED Notes (Signed)
Taken to Room 341 via stretcher.  Stable

## 2011-01-22 NOTE — ED Notes (Signed)
Report called to Karen--Pt. Will be going to Room 341.

## 2011-01-22 NOTE — ED Notes (Signed)
Sleeping on carrier---awakens with verbal and reports much relief of headache pain---awaiting lab results.

## 2011-01-22 NOTE — ED Notes (Signed)
Pt c/o fever, headache and body aches x 3 days.

## 2011-01-22 NOTE — ED Notes (Signed)
C/o nausea with vomiting after she tried to sit up.  Order for Zofran rcd and given.

## 2011-01-22 NOTE — ED Notes (Signed)
Continues to sleep--awakens easily.  Oral temp retaken and is now 98.2

## 2011-01-22 NOTE — H&P (Addendum)
Hospital Admission Note Date: 01/22/2011  Patient name: Hannah Cooper Medical record number: [id number] Date of birth: 05-Nov-1955 Age: 55 y.o. Gender: female PCP: None  Attending physician: Crista Curb Chief Complaint: Headache fevers body aches  History of Present Illness: Hannah Cooper is an 55 y.o. female who presents with a three-day history of worsening holocephalic headache, subjective fevers and chills, myalgias. Initially she thought it was a flulike illness but her headache has worsened so she came to the emergency room. She has had similar symptoms and was admitted and diagnosed with aseptic meningitis in 2003. At that time she had a negative HIV test. She has also had photophobia. She's had no cough, rash, tick bites, recent travel, sick contacts. She has no sinus congestion or postnasal drip. She's had no sore throat or rhinorrhea. She received morphine and subsequently Dilaudid. It was after the Dilaudid that she had intractable vomiting. She's had no diarrhea. She had a lumbar puncture which showed elevated protein, normal glucose, white blood cell of 6 and some red blood cells.  Past Medical History  Diagnosis Date  . Meningitis    Meds: Eyedrops for dry eyes. She does not know the name  Allergies: Review of patient's allergies indicates no known allergies.  Social history: Patient does not drink smoke or use drugs. She recently adopted a family of 3 young children and has 2 grown set of twins. She does not work.  Past Surgical History  Procedure Date  . Abdominal hysterectomy    Review of Systems: Systems reviewed and as above otherwise negative  Physical Exam: BP 154/98  Pulse 72  Temp(Src) 99.3 F (37.4 C) (Oral)  Resp 20  Ht 5\' 7"  (1.702 m)  Wt 86.183 kg (190 lb)  BMI 29.76 kg/m2  SpO2 100%  General Appearance:   uncomfortable appearing in a darkened room. Vomiting bilious emesis. Oriented and appropriate.   Head:    Normocephalic, without  obvious abnormality, atraumatic  Eyes:    PERRL, conjunctiva/corneas clear, EOM's intact, fundi    benign, both eyes  Ears:    Normal TM's and external ear canals, both ears  Nose:   Nares normal, septum midline, mucosa normal, no drainage    or sinus tenderness  Throat:   Lips, mucosa, and tongue normal; teeth and gums normal  Neck:   slight meningismus. No lymphadenopathy no thyromegaly   Back:     Symmetric, no curvature, ROM normal, no CVA tenderness  Lungs:     Clear to auscultation bilaterally, respirations unlabored  Chest Wall:    No tenderness or deformity   Heart:    Regular rate and rhythm, S1 and S2 normal, no murmur, rub   or gallop  Breast Exam:    No tenderness, masses, or nipple abnormality  Abdomen:     Soft, non-tender, bowel sounds active all four quadrants,    no masses, no organomegaly  Genitalia:   deferred   Rectal:   deferred   Extremities:   Extremities normal, atraumatic, no cyanosis or edema  Pulses:   2+ and symmetric all extremities  Skin:   Skin color, texture, turgor normal, no rashes or lesions  Lymph nodes:   Cervical, supraclavicular, and axillary nodes normal  Neurologic:   CNII-XII intact, normal strength, sensation and reflexes    throughout   Lab results: Basic Metabolic Panel:  Basename 01/22/11 1020  NA 138  K 4.4  CL 103  CO2 26  GLUCOSE 92  BUN 9  CREATININE  0.74  CALCIUM 9.3  MG --  PHOS --   Liver Function Tests: No results found for this basename: AST:2,ALT:2,ALKPHOS:2,BILITOT:2,PROT:2,ALBUMIN:2 in the last 72 hours No results found for this basename: LIPASE:2,AMYLASE:2 in the last 72 hours No results found for this basename: AMMONIA:2 in the last 72 hours CBC:  Basename 01/22/11 1020  WBC 4.7  NEUTROABS 2.5  HGB 12.4  HCT 37.1  MCV 88.8  PLT 258   Tube #1 of CSF showed 10 red blood cells, 50 glucose, 72 total protein, 1 white blood cell. It was clear and colorless.  CSF Gram stain shows white blood cells, culture  pending  UA was negative with a specific gravity of 1.020  Assessment & Plan: Principal Problem:  *Viral meningitis Active Problems:  Vomiting  Patient will be placed on observation. I suspect that she had a traumatic tap and possibly her CSF tubes were reversed. However, I will order an HSV PCR. She has no encephalopathy and my suspicion for HSV encephalitis is low. This is most likely viral meningitis and will require supportive care. I will however repeat an HIV test. I will change her pain medication to morphine as this caused less vomiting. Change antiemetics to Phenergan. Clear liquids for now.  Hannah Cooper L 01/22/2011, 4:29 PM

## 2011-01-22 NOTE — ED Notes (Signed)
Patient moved to room 10 for LP. LP tray set up per EDP.

## 2011-01-22 NOTE — ED Notes (Signed)
Sleeping on carrier--awakens with verbal.

## 2011-01-22 NOTE — ED Notes (Signed)
Reports feeling less nauseated and is resting with lights off.  Family members advised of airborne precautions and in to see pt.

## 2011-01-23 LAB — HIV ANTIBODY (ROUTINE TESTING W REFLEX): HIV: NONREACTIVE

## 2011-01-23 MED ORDER — PROMETHAZINE HCL 12.5 MG PO TABS
12.5000 mg | ORAL_TABLET | Freq: Four times a day (QID) | ORAL | Status: DC | PRN
  Administered 2011-01-23: 12.5 mg via ORAL
  Filled 2011-01-23: qty 1

## 2011-01-23 MED ORDER — ACETAMINOPHEN 160 MG/5ML PO SOLN
650.0000 mg | Freq: Four times a day (QID) | ORAL | Status: DC | PRN
  Administered 2011-01-23: 650 mg via ORAL
  Filled 2011-01-23: qty 20.3

## 2011-01-23 MED ORDER — OXYCODONE HCL 20 MG/ML PO CONC
5.0000 mg | ORAL | Status: DC | PRN
  Administered 2011-01-23 – 2011-01-24 (×2): 10 mg via ORAL
  Filled 2011-01-23 (×2): qty 1

## 2011-01-23 MED ORDER — ONDANSETRON HCL 4 MG/2ML IJ SOLN
4.0000 mg | Freq: Four times a day (QID) | INTRAMUSCULAR | Status: DC | PRN
  Administered 2011-01-23: 4 mg via INTRAVENOUS
  Filled 2011-01-23: qty 2

## 2011-01-23 NOTE — Progress Notes (Signed)
Still nauseated and with headache. Hold off on discharge today.

## 2011-01-23 NOTE — Progress Notes (Addendum)
Subjective: The patient was doing very well until just this morning. She has not had any pain medication since last night. Her headache was gone. Her nausea resolved until just now. Now her headache has returned and she had emesis after eating pancakes. The photophobia has improved. Her neck stiffness is improved.  Objective: Vital signs in last 24 hours: Filed Vitals:   01/22/11 1757 01/22/11 1900 01/22/11 2200 01/23/11 0600  BP: 155/76 166/82 160/90 153/95  Pulse: 58 73 71 64  Temp: 99.5 F (37.5 C) 99 F (37.2 C) 98.9 F (37.2 C) 98.2 F (36.8 C)  TempSrc: Oral Oral Oral Oral  Resp: 20 20 16 16   Height:      Weight:      SpO2: 98% 96% 96% 100%   Weight change:   Intake/Output Summary (Last 24 hours) at 01/23/11 1051 Last data filed at 01/23/11 0634  Gross per 24 hour  Intake   1473 ml  Output      0 ml  Net   1473 ml   General: She appears more comfortable today. The shades are open today. Neck: More supple today Cardiovascular regular rate rhythm without murmurs gallops rubs Lungs clear to auscultation bilaterally without wheeze rhonchi or rales Extremities no clubbing cyanosis or edema Abdomen soft nontender nondistended  Lab Results: Basic Metabolic Panel:  Lab 01/22/11 1610  NA 138  K 4.4  CL 103  CO2 26  GLUCOSE 92  BUN 9  CREATININE 0.74  CALCIUM 9.3  MG --  PHOS --   Liver Function Tests: No results found for this basename: AST:2,ALT:2,ALKPHOS:2,BILITOT:2,PROT:2,ALBUMIN:2 in the last 168 hours No results found for this basename: LIPASE:2,AMYLASE:2 in the last 168 hours No results found for this basename: AMMONIA:2 in the last 168 hours CBC:  Lab 01/22/11 1020  WBC 4.7  NEUTROABS 2.5  HGB 12.4  HCT 37.1  MCV 88.8  PLT 258   Cardiac Enzymes: No results found for this basename: CKTOTAL:3,CKMB:3,CKMBINDEX:3,TROPONINI:3 in the last 168 hours BNP: No results found for this basename: POCBNP:3 in the last 168 hours D-Dimer: No results found for  this basename: DDIMER:2 in the last 168 hours CBG: No results found for this basename: GLUCAP:6 in the last 168 hours Hemoglobin A1C: No results found for this basename: HGBA1C in the last 168 hours Fasting Lipid Panel: No results found for this basename: CHOL,HDL,LDLCALC,TRIG,CHOLHDL,LDLDIRECT in the last 960 hours Thyroid Function Tests: No results found for this basename: TSH,T4TOTAL,FREET4,T3FREE,THYROIDAB in the last 168 hours Anemia Panel: No results found for this basename: VITAMINB12,FOLATE,FERRITIN,TIBC,IRON,RETICCTPCT in the last 168 hours Alcohol Level: No results found for this basename: ETH:2 in the last 168 hours   Micro Results: Recent Results (from the past 240 hour(s))  CSF CULTURE     Status: Normal (Preliminary result)   Collection Time   01/22/11 12:00 PM      Component Value Range Status Comment   Specimen Description CSF   Final    Special Requests Normal   Final    Gram Stain     Final    Value: CYTOSPIN SLIDE WBC PRESENT,BOTH PMN AND MONONUCLEAR     NO ORGANISMS SEEN   Culture PENDING   Incomplete    Report Status PENDING   Incomplete   GRAM STAIN     Status: Normal   Collection Time   01/22/11 12:00 PM      Component Value Range Status Comment   Specimen Description CSF   Final    Special Requests Normal  Final    Gram Stain     Final    Value: WBC SEEN WBC PRESENT, PREDOMINANTLY MONONUCLEAR     Gram Stain Report Called to,Read Back By and Verified With: BOACHBURN, C AT 13:41 ON 01/22/11 BY PRUITT,C.   Report Status 01/22/2011 FINAL   Final    Studies/Results: No results found.  Scheduled Meds:   .  HYDROmorphone (DILAUDID) injection  1 mg Intravenous Once  . lidocaine      . ondansetron      . promethazine  12.5 mg Intravenous Once  . DISCONTD: sodium chloride  1,000 mL Intravenous Once   Continuous Infusions:   . DISCONTD: dextrose 5 % and 0.9 % NaCl with KCl 20 mEq/Cooper 100 mL/hr at 01/23/11 0634   PRN Meds:.acetaminophen (TYLENOL) oral  liquid 160 mg/5 mL, alum & mag hydroxide-simeth, bisacodyl, oxyCODONE, promethazine, senna, DISCONTD: acetaminophen, DISCONTD: acetaminophen, DISCONTD: morphine, DISCONTD: promethazine, DISCONTD: promethazine Assessment/Plan: Principal Problem:  *Viral meningitis Active Problems:  Vomiting  Somewhat improved. Patient anxious to go home. I will stop her IV fluids and change her antiemetics and pain medication to by mouth. If she is able to keep down fluids and her nausea and pain is fairly well-controlled she could potentially go home later on today.  LOS: 1 day   Hannah Cooper 01/23/2011, 10:51 AM

## 2011-01-24 LAB — HERPES SIMPLEX VIRUS(HSV) DNA BY PCR: HSV 1 DNA: NOT DETECTED

## 2011-01-24 MED ORDER — ACETAMINOPHEN 160 MG/5ML PO SOLN: 650.0000 mg | Freq: Four times a day (QID) | ORAL | Status: AC | PRN

## 2011-01-24 MED ORDER — PROMETHAZINE HCL 12.5 MG PO TABS: 12.5000 mg | ORAL_TABLET | Freq: Four times a day (QID) | ORAL | Status: DC | PRN

## 2011-01-24 MED ORDER — OXYCODONE HCL 5 MG PO TABS: 5.0000 mg | ORAL_TABLET | ORAL | Status: AC | PRN

## 2011-01-24 MED ORDER — HYDROCHLOROTHIAZIDE 25 MG PO TABS: 25.0000 mg | ORAL_TABLET | Freq: Every day | ORAL | Status: DC

## 2011-01-24 MED ORDER — IBUPROFEN 100 MG/5ML PO SUSP
400.0000 mg | Freq: Once | ORAL | Status: AC
  Administered 2011-01-24: 400 mg via ORAL
  Filled 2011-01-24: qty 5

## 2011-01-24 MED ORDER — OLMESARTAN MEDOXOMIL 20 MG PO TABS
10.0000 mg | ORAL_TABLET | Freq: Every day | ORAL | Status: DC
  Administered 2011-01-24: 10 mg via ORAL
  Filled 2011-01-24: qty 1

## 2011-01-24 NOTE — Progress Notes (Signed)
Removed pts IV.  The site was swollen, painful (per pt) and had a fluid filled blister.  Heat was placed at the site.  Will continue to monitor.

## 2011-01-24 NOTE — Progress Notes (Signed)
Pt discharged with instructions prescriptions, and carenotes.  She verbalizes understanding.  She left the floor with staff and family in stable condition.  With no further questions or concerns at this time.

## 2011-01-24 NOTE — Discharge Summary (Signed)
Physician Discharge Summary  Patient ID: Hannah Cooper MRN: [id number] DOB/AGE: 10-23-1955 55 y.o.  Admit date: 01/22/2011 Discharge date: 01/24/2011  Discharge Diagnoses:  Principal Problem:  *Viral meningitis Active Problems:  HYPERTENSION  Vomiting  Current Discharge Medication List    START taking these medications   Details  acetaminophen (TYLENOL) 160 MG/5ML solution Take 20.3 mLs (650 mg total) by mouth every 6 (six) hours as needed. Qty: 120 mL    hydrochlorothiazide (HYDRODIURIL) 25 MG tablet Take 1 tablet (25 mg total) by mouth daily. Qty: 30 tablet, Refills: 0    oxyCODONE (ROXICODONE) 5 MG immediate release tablet Take 1-2 tablets (5-10 mg total) by mouth every 4 (four) hours as needed for pain. Qty: 20 tablet, Refills: 0    promethazine (PHENERGAN) 12.5 MG tablet Take 1 tablet (12.5 mg total) by mouth every 6 (six) hours as needed for nausea. Qty: 20 tablet, Refills: 0      CONTINUE these medications which have NOT CHANGED   Details  PRESCRIPTION MEDICATION Place 1 drop into both eyes 2 (two) times daily. Unknown name or strength. Prescribed after eye exam. [facility] has no records.         Discharge Orders    Future Orders Please Complete By Expires   Diet - low sodium heart healthy      Increase activity slowly      Discharge instructions      Comments:   Drink plenty of fluids.   Driving Restrictions      Comments:   No driving while on pain medications     Followup: Dr. Geanie Cooley, who was on call for unassigned medicine.   procedures: Lumbar puncture  Disposition: Home or Self Care  Discharged Condition: Stable  Consults:   none  Labs:   Results for orders placed during the hospital encounter of 01/22/11 (from the past 48 hour(s))  CBC     Status: Normal   Collection Time   01/22/11 10:20 AM      Component Value Range Comment   WBC 4.7  4.0 - 10.5 (K/uL)    RBC 4.18  3.87 - 5.11 (MIL/uL)    Hemoglobin 12.4  12.0 - 15.0 (g/dL)    HCT  78.2  95.6 - 21.3 (%)    MCV 88.8  78.0 - 100.0 (fL)    MCH 29.7  26.0 - 34.0 (pg)    MCHC 33.4  30.0 - 36.0 (g/dL)    RDW 08.6  57.8 - 46.9 (%)    Platelets 258  150 - 400 (K/uL)   DIFFERENTIAL     Status: Normal   Collection Time   01/22/11 10:20 AM      Component Value Range Comment   Neutrophils Relative 54  43 - 77 (%)    Neutro Abs 2.5  1.7 - 7.7 (K/uL)    Lymphocytes Relative 37  12 - 46 (%)    Lymphs Abs 1.7  0.7 - 4.0 (K/uL)    Monocytes Relative 8  3 - 12 (%)    Monocytes Absolute 0.4  0.1 - 1.0 (K/uL)    Eosinophils Relative 1  0 - 5 (%)    Eosinophils Absolute 0.1  0.0 - 0.7 (K/uL)    Basophils Relative 0  0 - 1 (%)    Basophils Absolute 0.0  0.0 - 0.1 (K/uL)   BASIC METABOLIC PANEL     Status: Normal   Collection Time   01/22/11 10:20 AM  Component Value Range Comment   Sodium 138  135 - 145 (mEq/L)    Potassium 4.4  3.5 - 5.1 (mEq/L)    Chloride 103  96 - 112 (mEq/L)    CO2 26  19 - 32 (mEq/L)    Glucose, Bld 92  70 - 99 (mg/dL)    BUN 9  6 - 23 (mg/dL)    Creatinine, Ser 1.61  0.50 - 1.10 (mg/dL)    Calcium 9.3  8.4 - 10.5 (mg/dL)    GFR calc non Af Amer >90  >90 (mL/min)    GFR calc Af Amer >90  >90 (mL/min)   HIV ANTIBODY     Status: Normal   Collection Time   01/22/11 10:20 AM      Component Value Range Comment   HIV NON REACTIVE  NON REACTIVE    URINALYSIS, ROUTINE W REFLEX MICROSCOPIC     Status: Normal   Collection Time   01/22/11 10:59 AM      Component Value Range Comment   Color, Urine YELLOW  YELLOW     Appearance CLEAR  CLEAR     Specific Gravity, Urine 1.020  1.005 - 1.030     pH 8.0  5.0 - 8.0     Glucose, UA NEGATIVE  NEGATIVE (mg/dL)    Hgb urine dipstick NEGATIVE  NEGATIVE     Bilirubin Urine NEGATIVE  NEGATIVE     Ketones, ur NEGATIVE  NEGATIVE (mg/dL)    Protein, ur NEGATIVE  NEGATIVE (mg/dL)    Urobilinogen, UA 0.2  0.0 - 1.0 (mg/dL)    Nitrite NEGATIVE  NEGATIVE     Leukocytes, UA NEGATIVE  NEGATIVE  MICROSCOPIC NOT DONE ON  URINES WITH NEGATIVE PROTEIN, BLOOD, LEUKOCYTES, NITRITE, OR GLUCOSE <1000 mg/dL.  CSF CELL COUNT WITH DIFFERENTIAL     Status: Abnormal   Collection Time   01/22/11 12:00 PM      Component Value Range Comment   Tube # 1      Color, CSF COLORLESS  COLORLESS     Appearance, CSF CLEAR  CLEAR     Supernatant NOT INDICATED      RBC Count 10 (*) 0 (/cu mm)    WBC, CSF 1  0 - 5 (/cu mm)    Segmented Neutrophils-CSF TOO FEW TO COUNT, SMEAR AVAILABLE FOR REVIEW  0 - 6 (%)    Lymphs, CSF TOO FEW TO COUNT, SMEAR AVAILABLE FOR REVIEW  40 - 80 (%)    Monocyte-Macrophage-Spinal Fluid TOO FEW TO COUNT, SMEAR AVAILABLE FOR REVIEW  15 - 45 (%)    Eosinophils, CSF TOO FEW TO COUNT, SMEAR AVAILABLE FOR REVIEW  0 - 1 (%)    Other Cells, CSF PREDOMINANTLY LYMPH SEEN ON SMEAR     GLUCOSE, CSF     Status: Normal   Collection Time   01/22/11 12:00 PM      Component Value Range Comment   Glucose, CSF 50  43 - 76 (mg/dL)   PROTEIN, CSF     Status: Abnormal   Collection Time   01/22/11 12:00 PM      Component Value Range Comment   Total  Protein, CSF 72 (*) 15 - 45 (mg/dL)   CSF CULTURE     Status: Normal (Preliminary result)   Collection Time   01/22/11 12:00 PM      Component Value Range Comment   Specimen Description CSF      Special Requests Normal  Gram Stain        Value: CYTOSPIN SLIDE WBC PRESENT,BOTH PMN AND MONONUCLEAR     NO ORGANISMS SEEN   Culture NO GROWTH      Report Status PENDING     CSF CELL COUNT WITH DIFFERENTIAL     Status: Abnormal   Collection Time   01/22/11 12:00 PM      Component Value Range Comment   Tube # 4      Color, CSF COLORLESS  COLORLESS     Appearance, CSF CLEAR  CLEAR     Supernatant NOT INDICATED      RBC Count 313 (*) 0 (/cu mm)    WBC, CSF 6 (*) 0 - 5 (/cu mm)    Segmented Neutrophils-CSF 1  0 - 6 (%)    Lymphs, CSF 87 (*) 40 - 80 (%)    Monocyte-Macrophage-Spinal Fluid 11 (*) 15 - 45 (%)    Eosinophils, CSF 0  0 - 1 (%)    Other Cells, CSF 1 BASOPHIL      GRAM STAIN     Status: Normal   Collection Time   01/22/11 12:00 PM      Component Value Range Comment   Specimen Description CSF      Special Requests Normal      Gram Stain        Value: WBC SEEN WBC PRESENT, PREDOMINANTLY MONONUCLEAR     Gram Stain Report Called to,Read Back By and Verified With: BOACHBURN, C AT 13:41 ON 01/22/11 BY PRUITT,C.   Report Status 01/22/2011 FINAL       Full Code   Hospital Course: Please see H&P for complete admission details. The patient is a pleasant 55 year old black female who presented with severe headache, fevers chills myalgias. She also developed vomiting in the emergency room.  She also had some mild neck stiffness. On physical examination, she had a temperature of 99.3. Blood pressure was 154/98. She had otherwise normal vital signs. She had mild meningismus but otherwise fairly unremarkable exam. She had a lumbar puncture in the emergency room which had a protein of 73. She had red cells, but I suspect this was a traumatic tap, that the tubes may have been reversed. She had minimal white blood cells. She was admitted for supportive care with the diagnosis of aseptic meningitis. HIV was negative. Suspicion for HSV was low by PCR is pending. Her symptoms have improved and she is tolerating a regular diet. She still has some mild to moderate headache with occasional nausea but feels that she can manage at home with anti-emetics and pain medications.  She has a history of hypertension but has been off her antihypertensives for some time. Her blood pressure ranged 150 to 160 over about 90. She had previously been on Avapro and hydrochlorothiazide. I've given her a prescription for hydrochlorothiazide which she can start once her nausea resolves. She will need followup and establish a primary care physician. Dr. Geanie Cooley was on call for unassigned medicine the day she was admitted. Her blood pressure and HSV PCR will need to be followed up.  Discharge  Exam: Blood pressure 162/95, pulse 62, temperature 98.6 F (37 C), temperature source Oral, resp. rate 16, height 5\' 8"  (1.727 m), weight 92.5 kg (203 lb 14.8 oz), SpO2 94.00%.  Unchanged from 01/23/2011   Signed: Crista Curb L 01/24/2011, 9:16 AM

## 2011-01-26 LAB — CSF CULTURE W GRAM STAIN: Culture: NO GROWTH

## 2011-01-27 NOTE — ED Provider Notes (Signed)
History     CSN: [id number] Arrival date & time: 01/22/2011  8:23 AM  Chief Complaint  Patient presents with  . Generalized Body Aches  . Headache  . Fever    (Consider location/radiation/quality/duration/timing/severity/associated sxs/prior treatment) Patient is a 55 y.o. female presenting with headaches and fever. The history is provided by the patient.  Headache  This is a new problem. The current episode started more than 2 days ago. The problem occurs constantly (She describes low grade fever,  headache and myalgias which remind her of her last illness with meningitis from about 10 years ago). The problem has not changed since onset.The headache is associated with bright light. The pain is located in the bilateral region. The pain is at a severity of 10/10. The pain is severe. The pain does not radiate. Associated symptoms include a fever and malaise/fatigue. Pertinent negatives include no shortness of breath, no nausea and no vomiting. She has tried acetaminophen for the symptoms. The treatment provided no relief.  Fever Primary symptoms of the febrile illness include fever, headaches and myalgias. Primary symptoms do not include shortness of breath, abdominal pain, nausea, vomiting, arthralgias or rash.  The headache is not associated with weakness.  The myalgias are not associated with weakness.    Past Medical History  Diagnosis Date  . Meningitis     Past Surgical History  Procedure Date  . Abdominal hysterectomy   . Thyroid surgery 2002    History reviewed. No pertinent family history.  History  Substance Use Topics  . Smoking status: Never Smoker   . Smokeless tobacco: Never Used  . Alcohol Use: No    OB History    Grav Para Term Preterm Abortions TAB SAB Ect Mult Living                  Review of Systems  Constitutional: Positive for fever and malaise/fatigue.  HENT: Negative for congestion, sore throat and neck pain.   Eyes: Negative.   Respiratory:  Negative for chest tightness and shortness of breath.   Cardiovascular: Negative for chest pain.  Gastrointestinal: Negative for nausea, vomiting and abdominal pain.  Genitourinary: Negative.   Musculoskeletal: Positive for myalgias. Negative for joint swelling and arthralgias.  Skin: Negative.  Negative for rash and wound.  Neurological: Positive for headaches. Negative for dizziness, weakness, light-headedness and numbness.  Hematological: Negative.   Psychiatric/Behavioral: Negative.     Allergies  Review of patient's allergies indicates no known allergies.  Home Medications   Current Outpatient Rx  Name Route Sig Dispense Refill  . PRESCRIPTION MEDICATION Both Eyes Place 1 drop into both eyes 2 (two) times daily. Unknown name or strength. Prescribed after eye exam. [facility] has no records.     . ACETAMINOPHEN 160 MG/5ML PO SOLN Oral Take 20.3 mLs (650 mg total) by mouth every 6 (six) hours as needed. 120 mL   . HYDROCHLOROTHIAZIDE 25 MG PO TABS Oral Take 1 tablet (25 mg total) by mouth daily. 30 tablet 0    Start after nausea resolved.  . OXYCODONE HCL 5 MG PO TABS Oral Take 1-2 tablets (5-10 mg total) by mouth every 4 (four) hours as needed for pain. 20 tablet 0  . PROMETHAZINE HCL 12.5 MG PO TABS Oral Take 1 tablet (12.5 mg total) by mouth every 6 (six) hours as needed for nausea. 20 tablet 0    BP 162/95  Pulse 62  Temp(Src) 98.6 F (37 C) (Oral)  Resp 16  Ht 5\' 8"  (1.727  m)  Wt 203 lb 14.8 oz (92.5 kg)  BMI 31.01 kg/m2  SpO2 94%  Physical Exam  Nursing note and vitals reviewed. Constitutional: She is oriented to person, place, and time. She appears well-developed and well-nourished.       Uncomfortable appearing  HENT:  Head: Normocephalic and atraumatic.  Mouth/Throat: Oropharynx is clear and moist.  Eyes: EOM are normal. Pupils are equal, round, and reactive to light.  Neck: Normal range of motion. Neck supple.  Cardiovascular: Normal rate and normal heart sounds.     Pulmonary/Chest: Effort normal.  Abdominal: Soft. There is no tenderness.  Musculoskeletal: Normal range of motion.  Lymphadenopathy:    She has no cervical adenopathy.  Neurological: She is alert and oriented to person, place, and time. She has normal strength. No cranial nerve deficit or sensory deficit. She displays a negative Romberg sign. Gait normal. GCS eye subscore is 4. GCS verbal subscore is 5. GCS motor subscore is 6.       Normal heel-shin, normal rapid alternating movements.  Skin: Skin is warm and dry. No rash noted.  Psychiatric: She has a normal mood and affect. Her speech is normal and behavior is normal. Thought content normal. Cognition and memory are normal.    ED Course  Procedures (including critical care time)  Labs Reviewed  CSF CELL COUNT WITH DIFFERENTIAL - Abnormal; Notable for the following:    RBC Count 10 (*)    All other components within normal limits  PROTEIN, CSF - Abnormal; Notable for the following:    Total  Protein, CSF 72 (*)    All other components within normal limits  CSF CELL COUNT WITH DIFFERENTIAL - Abnormal; Notable for the following:    RBC Count 313 (*)    WBC, CSF 6 (*)    Lymphs, CSF 87 (*)    Monocyte-Macrophage-Spinal Fluid 11 (*)    All other components within normal limits  CBC  DIFFERENTIAL  BASIC METABOLIC PANEL  URINALYSIS, ROUTINE W REFLEX MICROSCOPIC  GLUCOSE, CSF  CSF CULTURE  GRAM STAIN  HERPES SIMPLEX VIRUS(HSV) DNA BY PCR  HIV ANTIBODY  LAB REPORT - SCANNED   No results found.   1. Headache   2. Viral meningitis   3. HYPERTENSION   4. Vomiting   5. HYPOTHYROIDISM   6. PNEUMONIA, LEFT LOWER LOBE   7. DISEASE, ACUTE BRONCHOSPASM   8. OSTEOARTHRITIS       MDM  LP performed by Dr Adriana Simas.  Findings inclusive,  Possible picture of viral meningitis.  Spoke with Dr Lendell Caprice who will admit pt. To [facility] service.    Procedure note;  LP peformed under sterile conditions by me in L3,4 interspace s  complications.  6 cc of clear spinal fluid removed.  Pt tolerated procedure well.   Donnetta Hutching MD  Medical screening examination/treatment/procedure(s) were conducted as a shared visit with non-physician practitioner(s) and myself.  I personally evaluated the patient during the encounter.  Severe ha and stiff neck.  Spinal fluid analysis suggests viral meningitis........Marland KitchenCRITICAL CARE Performed by: Donnetta Hutching   Total critical care time: 30  Critical care time was exclusive of separately billable procedures and treating other patients.  Critical care was necessary to treat or prevent imminent or life-threatening deterioration.  Critical care was time spent personally by me on the following activities: development of treatment plan with patient and/or surrogate as well as nursing, discussions with consultants, evaluation of patient's response to treatment, examination of patient, obtaining history from patient  or surrogate, ordering and performing treatments and interventions, ordering and review of laboratory studies, ordering and review of radiographic studies, pulse oximetry and re-evaluation of patient's condition. Care 30 min:  Examine pt, reviewing labs, disc c pt and consultant, potential serious dx  Candis Musa, PA 01/27/11 1510  Donnetta Hutching, MD 02/03/11 2208

## 2011-02-02 LAB — DIFFERENTIAL
Basophils Absolute: 0
Basophils Relative: 0
Eosinophils Absolute: 0
Monocytes Relative: 2 — ABNORMAL LOW
Neutro Abs: 11.1 — ABNORMAL HIGH
Neutrophils Relative %: 94 — ABNORMAL HIGH

## 2011-02-02 LAB — CBC
MCHC: 34.9
MCV: 87
Platelets: 256
RBC: 4.1

## 2011-02-02 LAB — BASIC METABOLIC PANEL
BUN: 7
CO2: 26
Calcium: 9.7
Chloride: 102
Creatinine, Ser: 0.9
GFR calc Af Amer: >60

## 2011-02-02 LAB — URINALYSIS, ROUTINE W REFLEX MICROSCOPIC
Glucose, UA: NEGATIVE
Hgb urine dipstick: NEGATIVE
Specific Gravity, Urine: 1.02
Urobilinogen, UA: 0.2

## 2011-02-02 LAB — CULTURE, BLOOD (ROUTINE X 2): Report Status: 6222008

## 2011-02-18 NOTE — ED Provider Notes (Signed)
Medical screening examination/treatment/procedure(s) were conducted as a shared visit with non-physician practitioner(s) and myself.  I personally evaluated the patient during the encounter.  Patient examined by me. I performed a lumbar puncture. Doubt bacterial meningitis.  Will admit for pain control  Donnetta Hutching, MD 02/18/11 1037

## 2011-08-10 ENCOUNTER — Emergency Department (HOSPITAL_COMMUNITY): Payer: Medicaid Other

## 2011-08-10 ENCOUNTER — Emergency Department (HOSPITAL_COMMUNITY)
Admission: EM | Admit: 2011-08-10 | Discharge: 2011-08-10 | Disposition: A | Discharge status: Home / Self Care | Payer: Medicaid Other | Attending: Emergency Medicine | Admitting: Emergency Medicine

## 2011-08-10 ENCOUNTER — Encounter (HOSPITAL_COMMUNITY): Payer: Self-pay | Admitting: Emergency Medicine

## 2011-08-10 DIAGNOSIS — M25461 Effusion, right knee: Secondary | ICD-10-CM

## 2011-08-10 DIAGNOSIS — M25469 Effusion, unspecified knee: Secondary | ICD-10-CM | POA: Insufficient documentation

## 2011-08-10 DIAGNOSIS — R296 Repeated falls: Secondary | ICD-10-CM | POA: Insufficient documentation

## 2011-08-10 DIAGNOSIS — M25569 Pain in unspecified knee: Secondary | ICD-10-CM | POA: Insufficient documentation

## 2011-08-10 DIAGNOSIS — R5381 Other malaise: Secondary | ICD-10-CM | POA: Insufficient documentation

## 2011-08-10 MED ORDER — HYDROCODONE-ACETAMINOPHEN 5-325 MG PO TABS: 1.0000 | ORAL_TABLET | ORAL | Status: AC | PRN

## 2011-08-10 NOTE — ED Provider Notes (Signed)
History     CSN: [id number]  Arrival date & time 08/10/11  1625   First MD Initiated Contact with Patient 08/10/11 1642      Chief Complaint  Patient presents with  . Knee Injury    (Consider location/radiation/quality/duration/timing/severity/associated sxs/prior treatment) HPI Comments: Patient fell from a standing position around 9 AM this morning landing directly on her right knee.  She has continued pain despite resting and avoiding weightbearing as much as possible.  Pain is sharp and constant and worse with weightbearing and range of motion.  She denies any other injury.  She has used ice and ibuprofen which has helped some with her symptoms.  The history is provided by the patient.    Past Medical History  Diagnosis Date  . Meningitis     Past Surgical History  Procedure Date  . Abdominal hysterectomy   . Thyroid surgery 2002    No family history on file.  History  Substance Use Topics  . Smoking status: Never Smoker   . Smokeless tobacco: Never Used  . Alcohol Use: No    OB History    Grav Para Term Preterm Abortions TAB SAB Ect Mult Living                  Review of Systems  Musculoskeletal: Positive for joint swelling and arthralgias.  Skin: Negative for wound.  Neurological: Positive for weakness.    Allergies  Review of patient's allergies indicates no known allergies.  Home Medications   Current Outpatient Rx  Name Route Sig Dispense Refill  . HYDROCHLOROTHIAZIDE 25 MG PO TABS Oral Take 1 tablet (25 mg total) by mouth daily. 30 tablet 0    Start after nausea resolved.  Marland Kitchen HYDROCODONE-ACETAMINOPHEN 5-325 MG PO TABS Oral Take 1 tablet by mouth every 4 (four) hours as needed for pain. 20 tablet 0  . PRESCRIPTION MEDICATION Both Eyes Place 1 drop into both eyes 2 (two) times daily. Unknown name or strength. Prescribed after eye exam. [facility] has no records.       BP 126/75  Pulse 83  Temp(Src) 99.3 F (37.4 C) (Oral)  Resp 18  Ht 5\' 5"  (1.651  m)  Wt 190 lb (86.183 kg)  BMI 31.62 kg/m2  SpO2 100%  Physical Exam  Nursing note and vitals reviewed. Constitutional: She appears well-developed and well-nourished.  HENT:  Head: Normocephalic.  Cardiovascular: Normal rate and intact distal pulses.  Exam reveals no decreased pulses.   Pulses:      Dorsalis pedis pulses are 2+ on the right side.  Musculoskeletal: She exhibits tenderness. She exhibits no edema.       Right knee: She exhibits bony tenderness. She exhibits no swelling, no effusion, no deformity, no erythema, no LCL laxity, normal meniscus and no MCL laxity. tenderness found. Medial joint line tenderness noted.  Neurological: She is alert. No sensory deficit.  Skin: Skin is warm, dry and intact.    ED Course  Procedures (including critical care time)  Labs Reviewed - No data to display Dg Knee Complete 4 Views Right  08/10/2011  *RADIOLOGY REPORT*  Clinical Data: Fall.  Pain.  RIGHT KNEE - COMPLETE 4+ VIEW  Comparison: None.  Findings: There is a small joint effusion.  No evidence of fracture or dislocation.  There is mild joint space narrowing of both weightbearing compartments with small marginal osteophytes.  IMPRESSION: Small effusion.  Mild degenerative change.  No acute bony finding.  Original Report Authenticated By: Thomasenia Sales,  M.D.     1. Knee effusion, right       MDM  Knee immobilizer and crutches provided.  Hydrocodone for pain, patient encouraged to continue using ibuprofen.  Referral to Dr. Jenelle Mages for further management of this injury.RICE        Candis Musa, [place] 08/10/11 1724

## 2011-08-10 NOTE — ED Notes (Signed)
Pt fell from standing position and injured right knee

## 2011-08-10 NOTE — ED Notes (Signed)
Pt was visitor at [facility] at unexpected death  Of sister. "I went down to my knees"  Pain rt knee.  Lt knee does not hurt.  Ice pack applied.  Family members at bedside.

## 2011-08-10 NOTE — Discharge Instructions (Signed)

## 2011-08-11 NOTE — ED Provider Notes (Signed)
Medical screening examination/treatment/procedure(s) were performed by non-physician practitioner and as supervising physician I was immediately available for consultation/collaboration.   Glynn Octave, MD 08/11/11 1121

## 2012-03-21 ENCOUNTER — Encounter (HOSPITAL_COMMUNITY): Payer: Self-pay

## 2012-03-21 ENCOUNTER — Emergency Department (HOSPITAL_COMMUNITY)
Admission: EM | Admit: 2012-03-21 | Discharge: 2012-03-21 | Disposition: A | Discharge status: Home / Self Care | Payer: Medicaid Other | Attending: Emergency Medicine | Admitting: Emergency Medicine

## 2012-03-21 DIAGNOSIS — R209 Unspecified disturbances of skin sensation: Secondary | ICD-10-CM | POA: Insufficient documentation

## 2012-03-21 DIAGNOSIS — Z79899 Other long term (current) drug therapy: Secondary | ICD-10-CM | POA: Insufficient documentation

## 2012-03-21 DIAGNOSIS — M7989 Other specified soft tissue disorders: Secondary | ICD-10-CM | POA: Insufficient documentation

## 2012-03-21 DIAGNOSIS — G039 Meningitis, unspecified: Secondary | ICD-10-CM | POA: Insufficient documentation

## 2012-03-21 DIAGNOSIS — M79609 Pain in unspecified limb: Secondary | ICD-10-CM | POA: Insufficient documentation

## 2012-03-21 DIAGNOSIS — E876 Hypokalemia: Secondary | ICD-10-CM | POA: Insufficient documentation

## 2012-03-21 DIAGNOSIS — M549 Dorsalgia, unspecified: Secondary | ICD-10-CM | POA: Insufficient documentation

## 2012-03-21 DIAGNOSIS — I10 Essential (primary) hypertension: Secondary | ICD-10-CM | POA: Insufficient documentation

## 2012-03-21 DIAGNOSIS — M79669 Pain in unspecified lower leg: Secondary | ICD-10-CM

## 2012-03-21 HISTORY — DX: Essential (primary) hypertension: I10

## 2012-03-21 LAB — CBC WITH DIFFERENTIAL/PLATELET
Basophils Absolute: 0 10*3/uL (ref 0.0–0.1)
Basophils Relative: 0 % (ref 0–1)
Eosinophils Absolute: 0.2 10*3/uL (ref 0.0–0.7)
Eosinophils Relative: 4 % (ref 0–5)
Lymphs Abs: 1.7 10*3/uL (ref 0.7–4.0)
MCH: 30.2 pg (ref 26.0–34.0)
MCHC: 34.5 g/dL (ref 30.0–36.0)
MCV: 87.7 fL (ref 78.0–100.0)
Neutrophils Relative %: 43 % (ref 43–77)
Platelets: 249 10*3/uL (ref 150–400)
RDW: 13.5 % (ref 11.5–15.5)

## 2012-03-21 LAB — BASIC METABOLIC PANEL
BUN: 15 mg/dL (ref 6–23)
CO2: 31 mEq/L (ref 19–32)
Calcium: 9.8 mg/dL (ref 8.4–10.5)
Creatinine, Ser: 0.85 mg/dL (ref 0.50–1.10)
GFR calc non Af Amer: 75 mL/min — ABNORMAL LOW (ref >90)
Glucose, Bld: 100 mg/dL — ABNORMAL HIGH (ref 70–99)

## 2012-03-21 MED ORDER — ENOXAPARIN SODIUM 100 MG/ML ~~LOC~~ SOLN
1.0000 mg/kg | Freq: Once | SUBCUTANEOUS | Status: AC
  Administered 2012-03-21: 90 mg via SUBCUTANEOUS
  Filled 2012-03-21: qty 1

## 2012-03-21 MED ORDER — OXYCODONE-ACETAMINOPHEN 5-325 MG PO TABS
2.0000 | ORAL_TABLET | Freq: Once | ORAL | Status: AC
  Administered 2012-03-21: 2 via ORAL
  Filled 2012-03-21: qty 2

## 2012-03-21 MED ORDER — OXYCODONE-ACETAMINOPHEN 5-325 MG PO TABS: 1.0000 | ORAL_TABLET | ORAL | Status: DC | PRN

## 2012-03-21 MED ORDER — POTASSIUM CHLORIDE ER 10 MEQ PO TBCR: 10.0000 meq | EXTENDED_RELEASE_TABLET | Freq: Every day | ORAL | Status: DC

## 2012-03-21 MED ORDER — POTASSIUM CHLORIDE CRYS ER 20 MEQ PO TBCR
40.0000 meq | EXTENDED_RELEASE_TABLET | Freq: Once | ORAL | Status: AC
  Administered 2012-03-21: 40 meq via ORAL
  Filled 2012-03-21: qty 2

## 2012-03-21 NOTE — ED Notes (Signed)
Pt c/o left leg pain x1 week.  Was seen by PCP and scheduled for ultrasound.  Today began experiencing numbness and tingling in the left foot. Pain is extending throughout the left leg and into the hip.  Pedal pulses are strong and equal.  Cap refill less than 3 sec. Nad at this time.

## 2012-03-21 NOTE — ED Provider Notes (Signed)
History    This chart was scribed for Joya Gaskins, MD, MD by Smitty Pluck, ED Scribe. The patient was seen in room APA03 and the patient's care was started at 8:49PM.   CSN: [id number]  Arrival date & time 03/21/12  1836      Chief Complaint  Patient presents with  . Leg Pain     Patient is a 56 y.o. female presenting with leg pain. The history is provided by the patient. No language interpreter was used.  Leg Pain  The incident occurred more than 2 days ago. The incident occurred at home. There was no injury mechanism. The pain is present in the left leg. The quality of the pain is described as throbbing. The pain is moderate. The pain has been constant since onset. Associated symptoms include numbness. She reports no foreign bodies present. The symptoms are aggravated by activity and bearing weight.   Hannah Cooper is a 56 y.o. female with hx of HTN who presents to the Emergency Department complaining of constant, moderate throbbing left leg pain onset 1 week ago. Pt reports that symptoms started with "funny" feeling her foot. She states having swelling in her left leg. Pt was seen by PCP and PCP was going to schedule to ultrasound. She states after leaving PCP office she had numbness in left foot.  Pt mentions having associated intermittent back pain but none at this time and the pain does not radiate into her leg. Pt denies fall, injury to leg, chest pain, abdominal pain, urinary and bowel incontinence, SOB, fever, chills, right leg pain, usage of estrogen, recent long travel and any other pain.  No h/o DVT/PE  Past Medical History  Diagnosis Date  . Meningitis   . Hypertension     Past Surgical History  Procedure Date  . Abdominal hysterectomy   . Thyroid surgery 2002    No family history on file.  History  Substance Use Topics  . Smoking status: Never Smoker   . Smokeless tobacco: Never Used  . Alcohol Use: No    OB History    Grav Para Term Preterm  Abortions TAB SAB Ect Mult Living                  Review of Systems  Constitutional: Negative for fever and chills.  Respiratory: Negative for chest tightness and shortness of breath.   Cardiovascular: Positive for leg swelling. Negative for chest pain.  Gastrointestinal: Negative for nausea, vomiting, diarrhea and blood in stool.  Musculoskeletal: Positive for back pain.  Neurological: Positive for numbness.  All other systems reviewed and are negative.    Allergies  Review of patient's allergies indicates no known allergies.  Home Medications   Current Outpatient Rx  Name  Route  Sig  Dispense  Refill  . HYDROCHLOROTHIAZIDE 25 MG PO TABS   Oral   Take 1 tablet (25 mg total) by mouth daily.   30 tablet   0     Start after nausea resolved.   Marland Kitchen PRESCRIPTION MEDICATION   Both Eyes   Place 1 drop into both eyes 2 (two) times daily. Unknown name or strength. Prescribed after eye exam. [facility] no [facility].            BP 148/101  Pulse 85  Temp 98.5 F (36.9 C) (Oral)  Resp 20  Ht 5\' 7"  (1.702 m)  Wt 203 lb (92.08 kg)  BMI 31.79 kg/m2  SpO2 100%  Physical Exam  Nursing  note and vitals reviewed. CONSTITUTIONAL: Well developed/well nourished HEAD AND FACE: Normocephalic/atraumatic EYES: EOMI/PERRL ENMT: Mucous membranes moist NECK: supple no meningeal signs SPINE:entire spine nontender CV: S1/S2 noted, no murmurs/rubs/gallops noted LUNGS: Lungs are clear to auscultation bilaterally, no apparent distress ABDOMEN: soft, nontender, no rebound or guarding GU:no cva tenderness NEURO: Pt is awake/alert, moves all extremitiesx4, pt is able to ambulate, no focal motor deficits of lower extremities but rom limited due to pain in leg.   No sensory deficit is noted EXTREMITIES: pulses normal, full ROM, left calf and left thigh tenderness, no erythema, no signs of trauma.  Minimal edema to left LE.  She has small area of bruising to left inner thigh.  No crepitance/erythema  to her leg.  Distal pulses are intact and equal. She can range the knee and ankle.   SKIN: warm, color normal PSYCH: no abnormalities of mood noted     ED Course  Procedures  DIAGNOSTIC STUDIES: Oxygen Saturation is 100% on room air, normal by my interpretation.    COORDINATION OF CARE: 8:54 PM Discussed ED treatment with pt     Labs Reviewed  BASIC METABOLIC PANEL - Abnormal; Notable for the following:    Potassium 3.0 (*)     Glucose, Bld 100 (*)     GFR calc non Af Amer 75 (*)     GFR calc Af Amer 86 (*)     All other components within normal limits  CBC WITH DIFFERENTIAL - Abnormal; Notable for the following:    WBC 3.9 (*)     HCT 35.7 (*)     All other components within normal limits   Pt well appearing, no distress, watching TV.    This does not appear to be lumbar radiculopathy as no back pain, and no neuro deficits.  She has pain throughout calf/thigh.  She does has some edema in the leg.  It is possible this is DVT.  Does not appear infectious Will return tomorrow for DVT study   MDM  Nursing notes including past medical history and social history reviewed and considered in documentation Labs/vital reviewed and considered       I personally performed the services described in this documentation, which was scribed in my presence. The recorded information has been reviewed and is accurate.      Joya Gaskins, MD 03/22/12 Lyda Jester

## 2012-03-21 NOTE — ED Notes (Signed)
Pt c/o left leg pain for 1 week with numbness that started. Pt seen by primary MD today and was going to be scheduled for ultrasound.

## 2012-03-22 ENCOUNTER — Ambulatory Visit (HOSPITAL_COMMUNITY)
Admit: 2012-03-22 | Discharge: 2012-03-22 | Disposition: A | Discharge status: Home / Self Care | Payer: Medicaid Other | Attending: Emergency Medicine | Admitting: Emergency Medicine

## 2012-03-22 DIAGNOSIS — R609 Edema, unspecified: Secondary | ICD-10-CM | POA: Insufficient documentation

## 2012-03-22 DIAGNOSIS — M79609 Pain in unspecified limb: Secondary | ICD-10-CM | POA: Insufficient documentation

## 2012-03-22 NOTE — ED Provider Notes (Signed)
Hannah Cooper is a 56 y.o. female who is here for interpretation of her left leg Doppler results. The study was done in radiology. There is no DVT. The patient reports that she has left leg, discomfort; of the entire leg; It feels like pins and needles. It comes and goes. She is able to walk. She has no back or buttock pain. She is seeing her PCP regularly.  Patient informed that there is no clear diagnosis for her leg discomfort, but it is safe to go home, and follow up with her PCP for further evaluation.  Dx: nonspecific left leg pain  Plan; PCP Followup  Flint Melter, MD 03/22/12 1731

## 2012-04-16 ENCOUNTER — Encounter (HOSPITAL_COMMUNITY): Payer: Self-pay | Admitting: Emergency Medicine

## 2012-04-16 ENCOUNTER — Emergency Department (HOSPITAL_COMMUNITY)
Admission: EM | Admit: 2012-04-16 | Discharge: 2012-04-16 | Disposition: A | Discharge status: Home / Self Care | Payer: Medicaid Other | Attending: Emergency Medicine | Admitting: Emergency Medicine

## 2012-04-16 ENCOUNTER — Emergency Department (HOSPITAL_COMMUNITY): Payer: Medicaid Other

## 2012-04-16 DIAGNOSIS — Z90710 Acquired absence of both cervix and uterus: Secondary | ICD-10-CM | POA: Insufficient documentation

## 2012-04-16 DIAGNOSIS — Z79899 Other long term (current) drug therapy: Secondary | ICD-10-CM | POA: Insufficient documentation

## 2012-04-16 DIAGNOSIS — Z8669 Personal history of other diseases of the nervous system and sense organs: Secondary | ICD-10-CM | POA: Insufficient documentation

## 2012-04-16 DIAGNOSIS — R51 Headache: Secondary | ICD-10-CM | POA: Insufficient documentation

## 2012-04-16 DIAGNOSIS — F411 Generalized anxiety disorder: Secondary | ICD-10-CM | POA: Insufficient documentation

## 2012-04-16 DIAGNOSIS — F418 Other specified anxiety disorders: Secondary | ICD-10-CM

## 2012-04-16 DIAGNOSIS — I10 Essential (primary) hypertension: Secondary | ICD-10-CM | POA: Insufficient documentation

## 2012-04-16 LAB — URINALYSIS, ROUTINE W REFLEX MICROSCOPIC
Leukocytes, UA: NEGATIVE
Protein, ur: NEGATIVE mg/dL
Urobilinogen, UA: 0.2 mg/dL (ref 0.0–1.0)

## 2012-04-16 LAB — CBC WITH DIFFERENTIAL/PLATELET
Basophils Relative: 0 % (ref 0–1)
Eosinophils Absolute: 0.1 10*3/uL (ref 0.0–0.7)
Lymphs Abs: 1.4 10*3/uL (ref 0.7–4.0)
MCH: 29.7 pg (ref 26.0–34.0)
MCHC: 33.8 g/dL (ref 30.0–36.0)
Neutro Abs: 1.3 10*3/uL — ABNORMAL LOW (ref 1.7–7.7)
Neutrophils Relative %: 43 % (ref 43–77)
Platelets: 250 10*3/uL (ref 150–400)
RBC: 4.07 MIL/uL (ref 3.87–5.11)

## 2012-04-16 LAB — POCT I-STAT TROPONIN I: Troponin i, poc: 0.01 ng/mL (ref 0.00–0.08)

## 2012-04-16 LAB — BASIC METABOLIC PANEL
Calcium: 9.4 mg/dL (ref 8.4–10.5)
GFR calc Af Amer: >90 mL/min (ref >90)
GFR calc non Af Amer: >90 mL/min (ref >90)
Glucose, Bld: 106 mg/dL — ABNORMAL HIGH (ref 70–99)
Sodium: 139 mEq/L (ref 135–145)

## 2012-04-16 MED ORDER — ASPIRIN 81 MG PO CHEW
324.0000 mg | CHEWABLE_TABLET | Freq: Once | ORAL | Status: AC
  Administered 2012-04-16: 324 mg via ORAL
  Filled 2012-04-16: qty 4

## 2012-04-16 MED ORDER — HYDROCODONE-ACETAMINOPHEN 5-325 MG PO TABS
ORAL_TABLET | ORAL | Status: AC
  Administered 2012-04-16: 1 via ORAL
  Filled 2012-04-16: qty 1

## 2012-04-16 MED ORDER — HYDROCODONE-ACETAMINOPHEN 5-325 MG PO TABS: 1.0000 | ORAL_TABLET | ORAL | Status: DC | PRN

## 2012-04-16 MED ORDER — LORAZEPAM 1 MG PO TABS: 1.0000 mg | ORAL_TABLET | Freq: Three times a day (TID) | ORAL | Status: DC | PRN

## 2012-04-16 MED ORDER — HYDROCODONE-ACETAMINOPHEN 5-325 MG PO TABS
1.0000 | ORAL_TABLET | Freq: Once | ORAL | Status: AC
  Administered 2012-04-16: 1 via ORAL

## 2012-04-16 NOTE — ED Notes (Signed)
Pt ambulatory to restroom without difficulty.

## 2012-04-16 NOTE — ED Provider Notes (Signed)
History   This chart was scribed for Flint Melter, MD by Charolett Bumpers, ED Scribe. The patient was seen in room APA02/APA02. Patient's care was started at 0803.   CSN: [id number]  Arrival date & time 04/16/12  5621   First MD Initiated Contact with Patient 04/16/12 [phone]      Chief Complaint  Patient presents with  . Chest Pain    The history is provided by the patient. No language interpreter was used.   Hannah Cooper is a 56 y.o. female who presents to the Emergency Department complaining of constant, moderate sharp chest pain that started last night. She rates the pain 8/10She also reports a persistent headache for the past couple of days which have been intermittent over the past 2 months. She has used Tylenol/Motrin which improves her pain. She has a h/o HTN and takes 25 mg Hydrochlorothiazide. She checks her BP daily and was 161/101 at 7 am. She states her BP is 120/78-80 normally. Husband states her BP was 140/94 last night. She states that she has taken extra BP medication in attempt to reduce her BP. She reports that she has had excess stress due to a death in the family. She has a h/o partial hysterectomy and thyroid surgery. She also has a h/o meningitis.   PCP: Dr. Loleta Chance  Past Medical History  Diagnosis Date  . Meningitis   . Hypertension     Past Surgical History  Procedure Date  . Abdominal hysterectomy   . Thyroid surgery 2002    History reviewed. No pertinent family history.  History  Substance Use Topics  . Smoking status: Never Smoker   . Smokeless tobacco: Never Used  . Alcohol Use: No    OB History    Grav Para Term Preterm Abortions TAB SAB Ect Mult Living                  Review of Systems  Cardiovascular: Positive for chest pain.  Neurological: Positive for headaches.  All other systems reviewed and are negative.    Allergies  Review of patient's allergies indicates no known allergies.  Home Medications   Current  Outpatient Rx  Name  Route  Sig  Dispense  Refill  . HYDROCHLOROTHIAZIDE 25 MG PO TABS   Oral   Take 25 mg by mouth every morning.         . IBUPROFEN 800 MG PO TABS   Oral   Take 800 mg by mouth every 8 (eight) hours as needed. Pain         . HYDROCODONE-ACETAMINOPHEN 5-325 MG PO TABS   Oral   Take 1 tablet by mouth every 4 (four) hours as needed for pain.   15 tablet   0   . LORAZEPAM 1 MG PO TABS   Oral   Take 1 tablet (1 mg total) by mouth 3 (three) times daily as needed for anxiety.   15 tablet   0     BP 165/92  Pulse 55  Temp 98.3 F (36.8 C) (Oral)  Resp 14  Ht 5\' 7"  (1.702 m)  Wt 190 lb (86.183 kg)  BMI 29.76 kg/m2  SpO2 100%  Physical Exam  Nursing note and vitals reviewed. Constitutional: She is oriented to person, place, and time. She appears well-developed and well-nourished. No distress.  HENT:  Head: Normocephalic and atraumatic.       Left TM normal, Right TM not visualized due to cerumen.    Eyes:  Conjunctivae normal and EOM are normal.  Neck: Neck supple. No tracheal deviation present.       No meningismus.   Cardiovascular: Normal rate, regular rhythm and normal heart sounds.   No murmur heard. Pulmonary/Chest: Effort normal and breath sounds normal. No respiratory distress. She has no wheezes. She has no rhonchi. She has no rales.  Abdominal: Soft. She exhibits no distension.  Musculoskeletal: Normal range of motion. She exhibits tenderness.       Mild paravertebral tenderness around thoracic and lumbar spine. Left leg is diffusely tender, no deformities.   Neurological: She is alert and oriented to person, place, and time. No cranial nerve deficit or sensory deficit.  Skin: Skin is warm and dry.  Psychiatric: She has a normal mood and affect. Her behavior is normal.    ED Course  Procedures (including critical care time)  DIAGNOSTIC STUDIES: Oxygen Saturation is 97% on room air, normal by my interpretation.    COORDINATION OF  CARE:  08:15-Discussed planned course of treatment with the patient including a chest x-ray, CT of head, blood work and UA, who is agreeable at this time.   08:30-Medication Orders: Aspirin chewable tablet 324 mg-once  09:45-Medication Orders: Hydrocodone-acetaminophen (Norco/Vicodin) 5-325 mg per tablet 1 tablet-once.   12:23-Recheck: Informed pt of imaging and lab results which were normal. Discussed f/u next with PCP for continued high BP. Pt appears comfortable now and expresses no further complaints.    Date: 02/03/2012  Rate: 64  Rhythm: normal sinus rhythm  QRS Axis: normal  PR and QT Intervals: normal  ST/T Wave abnormalities: nonspecific T wave changes  PR and QRS Conduction Disutrbances:none  Narrative Interpretation:   Old EKG Reviewed: none available  Results for orders placed during the hospital encounter of 04/16/12  BASIC METABOLIC PANEL      Component Value Range   Sodium 139  135 - 145 mEq/L   Potassium 4.0  3.5 - 5.1 mEq/L   Chloride 102  96 - 112 mEq/L   CO2 30  19 - 32 mEq/L   Glucose, Bld 106 (*) 70 - 99 mg/dL   BUN 12  6 - 23 mg/dL   Creatinine, Ser 1.61  0.50 - 1.10 mg/dL   Calcium 9.4  8.4 - 09.6 mg/dL   GFR calc non Af Amer >90  >90 mL/min   GFR calc Af Amer >90  >90 mL/min  CBC WITH DIFFERENTIAL      Component Value Range   WBC 3.1 (*) 4.0 - 10.5 K/uL   RBC 4.07  3.87 - 5.11 MIL/uL   Hemoglobin 12.1  12.0 - 15.0 g/dL   HCT 04.5 (*) 40.9 - 81.1 %   MCV 88.0  78.0 - 100.0 fL   MCH 29.7  26.0 - 34.0 pg   MCHC 33.8  30.0 - 36.0 g/dL   RDW 91.4  78.2 - 95.6 %   Platelets 250  150 - 400 K/uL   Neutrophils Relative 43  43 - 77 %   Neutro Abs 1.3 (*) 1.7 - 7.7 K/uL   Lymphocytes Relative 43  12 - 46 %   Lymphs Abs 1.4  0.7 - 4.0 K/uL   Monocytes Relative 11  3 - 12 %   Monocytes Absolute 0.3  0.1 - 1.0 K/uL   Eosinophils Relative 3  0 - 5 %   Eosinophils Absolute 0.1  0.0 - 0.7 K/uL   Basophils Relative 0  0 - 1 %   Basophils Absolute 0.0  0.0 -  0.1 K/uL  URINALYSIS, ROUTINE W REFLEX MICROSCOPIC      Component Value Range   Color, Urine YELLOW  YELLOW   APPearance CLEAR  CLEAR   Specific Gravity, Urine 1.015  1.005 - 1.030   pH 7.0  5.0 - 8.0   Glucose, UA NEGATIVE  NEGATIVE mg/dL   Hgb urine dipstick NEGATIVE  NEGATIVE   Bilirubin Urine NEGATIVE  NEGATIVE   Ketones, ur NEGATIVE  NEGATIVE mg/dL   Protein, ur NEGATIVE  NEGATIVE mg/dL   Urobilinogen, UA 0.2  0.0 - 1.0 mg/dL   Nitrite NEGATIVE  NEGATIVE   Leukocytes, UA NEGATIVE  NEGATIVE  POCT I-STAT TROPONIN I      Component Value Range   Troponin i, poc 0.00  0.00 - 0.08 ng/mL   Comment 3           POCT I-STAT TROPONIN I      Component Value Range   Troponin i, poc 0.01  0.00 - 0.08 ng/mL   Comment 3             Dg Chest 2 View  04/16/2012  *RADIOLOGY REPORT*  Clinical Data: Chest pain, hypertension.  CHEST - 2 VIEW  Comparison: 10/03/2006  Findings: Heart and mediastinal contours are within normal limits. No focal opacities or effusions.  No acute bony abnormality.  IMPRESSION: No active cardiopulmonary disease.   Original Report Authenticated By: Charlett Nose, M.D.    Ct Head Wo Contrast  04/16/2012  *RADIOLOGY REPORT*  Clinical Data: Chest pain.  Headache.  Hypertension.  CT HEAD WITHOUT CONTRAST  Technique:  Contiguous axial images were obtained from the base of the skull through the vertex without contrast.  Comparison: 07/18/2010  Findings: No acute intracranial abnormality.  Specifically, no hemorrhage, hydrocephalus, mass lesion, acute infarction, or significant intracranial injury.  No acute calvarial abnormality. Visualized paranasal sinuses and mastoids clear.  Orbital soft tissues unremarkable.  IMPRESSION: No acute intracranial abnormality.   Original Report Authenticated By: Charlett Nose, M.D.    Nursing notes, applicable records and vitals reviewed.  Radiologic Images/Reports reviewed.   1. Headache   2. Situational anxiety       MDM  Nonspecific  headache, and chest pain. Doubt ACS, PE, pneumonia, CVA, or meningitis. Patient stable for discharge   I personally performed the services described in this documentation, which was scribed in my presence. The recorded information has been reviewed and is accurate.     Plan: Home Medications- Norco, Ativan; Home Treatments- Rest; Recommended follow up- PCP prn     Flint Melter, MD 04/16/12 [phone]

## 2012-04-16 NOTE — ED Notes (Signed)
MD at bedside. 

## 2012-04-16 NOTE — ED Notes (Signed)
Reports chest pain continues, as well as her head.  No acute distress noted.  Vitals WDL.

## 2012-04-16 NOTE — ED Notes (Signed)
Pt states that she started having chest pain that was a pressure sensation last night, states she has a headache right now.  States she saw her doctor for her headaches and they prescribed her blood pressure medications, states that her headaches come and go.  States her chest pain is greater than her headache right now.  Reports that she did have some nausea last night, but no vomiting last night.  Reports some shortness of breath as well, pt speaks in full sentences without distress, O2 sat 100% on 2 lpm O2.

## 2012-04-16 NOTE — ED Notes (Signed)
Pt states chest pain started with sharp pain last night around midnight. Pt states radiating to left arm and left back. States has SOB with episodes since last night. Chest pain woke her up this am. Pt states has been under a lot of stress from losing a nephew last week.

## 2012-04-17 LAB — URINE CULTURE

## 2012-04-18 DIAGNOSIS — IMO0001 Reserved for inherently not codable concepts without codable children: Secondary | ICD-10-CM

## 2012-04-18 HISTORY — DX: Reserved for inherently not codable concepts without codable children: IMO0001

## 2012-09-04 ENCOUNTER — Encounter (HOSPITAL_COMMUNITY): Payer: Self-pay | Admitting: *Deleted

## 2012-09-04 ENCOUNTER — Observation Stay (HOSPITAL_COMMUNITY)
Admission: EM | Admit: 2012-09-04 | Discharge: 2012-09-06 | Disposition: A | Discharge status: Home / Self Care | Payer: Medicaid Other | Attending: Cardiology | Admitting: Cardiology

## 2012-09-04 ENCOUNTER — Emergency Department (HOSPITAL_COMMUNITY): Payer: Medicaid Other

## 2012-09-04 DIAGNOSIS — R1110 Vomiting, unspecified: Secondary | ICD-10-CM

## 2012-09-04 DIAGNOSIS — I200 Unstable angina: Secondary | ICD-10-CM

## 2012-09-04 DIAGNOSIS — I10 Essential (primary) hypertension: Secondary | ICD-10-CM | POA: Diagnosis present

## 2012-09-04 DIAGNOSIS — E876 Hypokalemia: Secondary | ICD-10-CM | POA: Diagnosis present

## 2012-09-04 DIAGNOSIS — R072 Precordial pain: Principal | ICD-10-CM | POA: Diagnosis present

## 2012-09-04 DIAGNOSIS — R079 Chest pain, unspecified: Secondary | ICD-10-CM

## 2012-09-04 LAB — COMPREHENSIVE METABOLIC PANEL
AST: 23 U/L (ref 0–37)
Albumin: 4.1 g/dL (ref 3.5–5.2)
Alkaline Phosphatase: 73 U/L (ref 39–117)
Chloride: 102 mEq/L (ref 96–112)
Creatinine, Ser: 0.77 mg/dL (ref 0.50–1.10)
Potassium: 2.9 mEq/L — ABNORMAL LOW (ref 3.5–5.1)
Total Bilirubin: 0.2 mg/dL — ABNORMAL LOW (ref 0.3–1.2)
Total Protein: 8.5 g/dL — ABNORMAL HIGH (ref 6.0–8.3)

## 2012-09-04 LAB — CBC WITH DIFFERENTIAL/PLATELET
Basophils Absolute: 0 10*3/uL (ref 0.0–0.1)
Basophils Relative: 1 % (ref 0–1)
Eosinophils Absolute: 0.1 10*3/uL (ref 0.0–0.7)
MCH: 30.2 pg (ref 26.0–34.0)
MCHC: 34.3 g/dL (ref 30.0–36.0)
Neutro Abs: 1.9 10*3/uL (ref 1.7–7.7)
Neutrophils Relative %: 51 % (ref 43–77)
RDW: 13.6 % (ref 11.5–15.5)

## 2012-09-04 LAB — MAGNESIUM: Magnesium: 1.8 mg/dL (ref 1.5–2.5)

## 2012-09-04 LAB — TROPONIN I: Troponin I: <0.3 ng/mL (ref <0.30)

## 2012-09-04 MED ORDER — NITROGLYCERIN 2 % TD OINT
1.0000 [in_us] | TOPICAL_OINTMENT | TRANSDERMAL | Status: DC
  Administered 2012-09-04 – 2012-09-06 (×3): 1 [in_us] via TOPICAL
  Filled 2012-09-04 (×12): qty 30
  Filled 2012-09-04: qty 1
  Filled 2012-09-04 (×11): qty 30

## 2012-09-04 MED ORDER — SODIUM CHLORIDE 0.9 % IJ SOLN
3.0000 mL | Freq: Two times a day (BID) | INTRAMUSCULAR | Status: DC
  Administered 2012-09-04 – 2012-09-06 (×3): 3 mL via INTRAVENOUS

## 2012-09-04 MED ORDER — ONDANSETRON HCL 4 MG/2ML IJ SOLN
4.0000 mg | INTRAMUSCULAR | Status: DC | PRN
  Administered 2012-09-04 – 2012-09-05 (×2): 4 mg via INTRAVENOUS
  Filled 2012-09-04 (×2): qty 2

## 2012-09-04 MED ORDER — ASPIRIN EC 81 MG PO TBEC
81.0000 mg | DELAYED_RELEASE_TABLET | Freq: Every day | ORAL | Status: DC
  Administered 2012-09-06: 81 mg via ORAL
  Filled 2012-09-04: qty 1

## 2012-09-04 MED ORDER — ACETAMINOPHEN 500 MG PO TABS
1000.0000 mg | ORAL_TABLET | Freq: Once | ORAL | Status: AC
  Administered 2012-09-04: 1000 mg via ORAL
  Filled 2012-09-04: qty 2

## 2012-09-04 MED ORDER — TRAZODONE HCL 50 MG PO TABS
50.0000 mg | ORAL_TABLET | Freq: Every evening | ORAL | Status: DC | PRN
  Filled 2012-09-04: qty 1

## 2012-09-04 MED ORDER — MORPHINE SULFATE 4 MG/ML IJ SOLN
4.0000 mg | Freq: Once | INTRAMUSCULAR | Status: AC
  Administered 2012-09-04: 4 mg via INTRAVENOUS
  Filled 2012-09-04: qty 1

## 2012-09-04 MED ORDER — NITROGLYCERIN 2 % TD OINT: 1.0000 [in_us] | TOPICAL_OINTMENT | Freq: Four times a day (QID) | TRANSDERMAL | Status: DC

## 2012-09-04 MED ORDER — FLEET ENEMA 7-19 GM/118ML RE ENEM: 1.0000 | ENEMA | Freq: Once | RECTAL | Status: AC | PRN

## 2012-09-04 MED ORDER — NITROGLYCERIN 0.4 MG SL SUBL
0.4000 mg | SUBLINGUAL_TABLET | SUBLINGUAL | Status: DC | PRN
  Administered 2012-09-04 (×3): 0.4 mg via SUBLINGUAL

## 2012-09-04 MED ORDER — POTASSIUM CHLORIDE 10 MEQ/100ML IV SOLN
10.0000 meq | INTRAVENOUS | Status: AC
  Administered 2012-09-04 – 2012-09-05 (×4): 10 meq via INTRAVENOUS
  Filled 2012-09-04 (×3): qty 100

## 2012-09-04 MED ORDER — MORPHINE SULFATE 4 MG/ML IJ SOLN
4.0000 mg | INTRAMUSCULAR | Status: DC | PRN
  Administered 2012-09-04 – 2012-09-05 (×4): 4 mg via INTRAVENOUS
  Filled 2012-09-04 (×4): qty 1

## 2012-09-04 MED ORDER — PANTOPRAZOLE SODIUM 40 MG IV SOLR
40.0000 mg | INTRAVENOUS | Status: DC
  Administered 2012-09-04 – 2012-09-05 (×2): 40 mg via INTRAVENOUS
  Filled 2012-09-04 (×5): qty 40

## 2012-09-04 MED ORDER — NITROGLYCERIN 0.4 MG SL SUBL
SUBLINGUAL_TABLET | SUBLINGUAL | Status: AC
  Administered 2012-09-04: 0.4 mg via SUBLINGUAL
  Filled 2012-09-04: qty 25

## 2012-09-04 MED ORDER — ACETAMINOPHEN 325 MG PO TABS: 650.0000 mg | ORAL_TABLET | ORAL | Status: DC | PRN

## 2012-09-04 MED ORDER — ENOXAPARIN SODIUM 40 MG/0.4ML ~~LOC~~ SOLN
40.0000 mg | SUBCUTANEOUS | Status: DC
  Filled 2012-09-04: qty 0.4

## 2012-09-04 MED ORDER — POTASSIUM CHLORIDE IN NACL 20-0.9 MEQ/L-% IV SOLN
INTRAVENOUS | Status: DC
  Administered 2012-09-04: 21:00:00 via INTRAVENOUS
  Filled 2012-09-04 (×6): qty 1000

## 2012-09-04 NOTE — H&P (Signed)
[facility] History and Physical  [name]  ZOX:[id number]  DOB: 1955-06-08   DOA: 09/04/2012   PCP:   Hannah Courier, MD   Chief Complaint:  Chest pain since last night  HPI: Hannah Cooper is an 57 y.o. female.   Middle-aged African American American, was cardiac risk factors include hypertension, obesity, and a mother and sister who died from acute myocardial infarction at early age, he developed new onset central chest pressure and pain starting about 9:30 last night. She took 2 aspirin and went to bed, but awoke feeling uncomfortable. The pain restarted at about 1:30 PM today before she had anything to eat, but was not relieved by eating, is a steady 8-9/10 intensity, and by mid afternoon she began to sweat profusely and developed an aching in her left arm and back; she became nauseous and vomited twice and was short of breath. She waited for her husband to come home about her young children could be cared for and then she came to the emergency room at about 4 PM to be evaluated.  Sublingual nitroglycerin x3 along with morphine took the pain down from 8-9/10 to about a 5/10. She reports the pain is now persisting at about a 6/10. It is not affected by changes in position or breathing. She used to exercise by walking about 2-1/2 miles a but is not exercising so frequently more.  EKG and cardiac enzymes have so far been unremarkable in the ED  She takes HCTZ for high blood pressure but does not take supplemental potassium.   Rewiew of Systems:   All systems negative except as marked bold or noted in the HPI;  Constitutional:    malaise, fever and chills. ;  Eyes:   eye pain, redness and discharge. ;  ENMT:   ear pain, hoarseness, nasal congestion, sinus pressure and sore throat. ;  Cardiovascular:    chest pain, palpitations, diaphoresis, dyspnea ; peripheral edema on and off for months Respiratory:   cough, hemoptysis, wheezing and stridor. ;  Gastrointestinal:   nausea, vomiting, diarrhea, constipation, abdominal pain, melena, blood in stool, hematemesis, jaundice and rectal bleeding. unusual weight loss..   Genitourinary:    frequency, dysuria, incontinence,flank pain and hematuria; Musculoskeletal:   back pain and neck pain.  swelling and trauma.;  Skin: .  pruritus, rash, abrasions, bruising and skin lesion.; ulcerations Neuro:    headache, lightheadedness and neck stiffness.  weakness, altered level of consciousness, altered mental status, extremity weakness, burning feet, involuntary movement, seizure and syncope.  Psych:    anxiety, depression, insomnia, tearfulness, panic attacks, hallucinations, paranoia, suicidal or homicidal ideation    Past Medical History  Diagnosis Date  . Meningitis   . Hypertension     Past Surgical History  Procedure Laterality Date  . Abdominal hysterectomy    . Thyroid surgery  2002    Medications:  HOME MEDS: Prior to Admission medications   Medication Sig Start Date End Date Taking? Authorizing Provider  aspirin 325 MG tablet Take 325-650 mg by mouth once as needed for pain.   Yes Historical Provider, MD  hydrochlorothiazide (HYDRODIURIL) 25 MG tablet Take 25 mg by mouth daily.   Yes Historical Provider, MD     Allergies:  No Known Allergies  Social History:   reports that she has never smoked. She has never used smokeless tobacco. She reports that she does not drink alcohol or use illicit drugs.  Family History: Mother died of acute MI at age 57; sister  died of an acute MI at age 64.  Physical Exam: Filed Vitals:   09/04/12 1640 09/04/12 1654 09/04/12 1706 09/04/12 1759  BP: 119/67 120/66 107/61 122/78  Temp:      TempSrc:      Resp:    18  Height:      Weight:      SpO2: 96%      Blood pressure 122/78, temperature 98.7 F (37.1 C), temperature source Oral, resp. rate 18, height 5\' 7"  (1.702 m), weight 87.544 kg (193 lb), SpO2 96.00%.  GEN:  Pleasant overweight African American lady  lying bed in no acute distress; cooperative with exam PSYCH:  alert and oriented x4;  neither anxious nor depressed ;  affect is appropriate. HEENT: Mucous membranes pink and anicteric; PERRLA; EOM intact; no cervical lymphadenopathy  or carotid bruit; no JVD; Breasts:: Not examined CHEST WALL: Sternal tenderness CHEST: Normal respiration, clear to auscultation bilaterally HEART: Regular rate and rhythm; no murmurs rubs or gallops BACK: No kyphosis no scoliosis; no CVA tenderness ABDOMEN: Obese, soft non-tender; no masses, no organomegaly, normal abdominal bowel sounds;  Rectal Exam: Not done EXTREMITIES:  age-appropriate arthropathy of the hands and knees; no edema; no ulcerations. Genitalia: not examined PULSES: 2+ and symmetric SKIN: Normal hydration no rash or ulceration CNS: Cranial nerves 2-12 grossly intact no focal lateralizing neurologic deficit   Labs on Admission:  Basic Metabolic Panel:  Recent Labs Lab 09/04/12 1634  NA 142  K 2.9*  CL 102  CO2 29  GLUCOSE 79  BUN 15  CREATININE 0.77  CALCIUM 9.9   Liver Function Tests:  Recent Labs Lab 09/04/12 1634  AST 23  ALT 30  ALKPHOS 73  BILITOT 0.2*  PROT 8.5*  ALBUMIN 4.1   No results found for this basename: LIPASE, AMYLASE,  in the last 168 hours No results found for this basename: AMMONIA,  in the last 168 hours CBC:  Recent Labs Lab 09/04/12 1634  WBC 3.7*  NEUTROABS 1.9  HGB 12.4  HCT 36.2  MCV 88.1  PLT 259   Cardiac Enzymes:  Recent Labs Lab 09/04/12 1634  TROPONINI <0.30   BNP: No components found with this basename: POCBNP,  D-dimer: No components found with this basename: D-DIMER,  CBG: No results found for this basename: GLUCAP,  in the last 168 hours  Radiological Exams on Admission: Dg Chest Port 1 View  09/04/2012   *RADIOLOGY REPORT*  Clinical Data: Chest and left arm pain.  Nausea vomiting.  CHEST - 1 VIEW  Comparison:  04/16/2012  Findings: The heart size and mediastinal  contours are within normal limits.  Both lungs are clear.  IMPRESSION: No active disease.   Original Report Authenticated By: Myles Rosenthal, M.D.    EKG: Independently reviewed. Normal sinus rhythm; nonspecific ST segment abnormalites.  Assessment/Plan  Principal Problem:   Unstable angina  Active Problems:   HYPERTENSION   Hypokalemia   PLAN: We'll bring this lady on observation for serial cardiac enzymes and monitoring of her electrocardiogram.  We'll keep her n.p.o. after midnight and consult cardiology for possible stress testing in the morning.  Replete potassium; check thyroid function checkt fasting lipid panel  Other plans as per orders.  Code Status:FULL CODE Family Communication:   Plans discussed with patient Disposition Plan: Likely home within 24 to 48-hour depending on results of investigation   Olivine Hiers Nocturnist [facility] Pager [phone]   09/04/2012, 7:53 PM

## 2012-09-04 NOTE — ED Notes (Signed)
Pt's pain decreased to 6 1/2 after 2nd nitro and 4mg  of morphine.  3rd nitro given.

## 2012-09-04 NOTE — ED Notes (Signed)
Meal tray ordered 

## 2012-09-04 NOTE — ED Notes (Signed)
Intermittent cp since last night.  Took ASA 325 mg today at 0900 today for pain with minimal relief.  States later, broke out in sweat and vomited x 2.  States pain is now radiating down left arm.

## 2012-09-04 NOTE — ED Notes (Signed)
Pt reports chest heaviness since last night.  Reports heaviness got worse this morning.  Reports was diaphoretic and nauseated with the pain.  Reports vomited x 2.

## 2012-09-04 NOTE — ED Notes (Signed)
Meal tray given 

## 2012-09-04 NOTE — ED Provider Notes (Signed)
History  This chart was scribed for Hannah Lennert, MD by Bennett Scrape, ED Scribe. This patient was seen in room APA18/APA18 and the patient's care was started at 4:25 PM.  CSN: [id number]  Arrival date & time 09/04/12  1617   First MD Initiated Contact with Patient 09/04/12 1625      Chief Complaint  Patient presents with  . Chest Pain     Patient is a 57 y.o. female presenting with chest pain. The history is provided by the patient. No language interpreter was used.  Chest Pain Pain location:  Substernal area Pain quality: pressure   Pain radiates to:  L arm Pain radiates to the back: no   Duration:  1 day Timing:  Intermittent Progression:  Worsening Chronicity:  New Context: at rest   Relieved by:  Aspirin Worsened by:  Nothing tried Associated symptoms: diaphoresis, nausea and vomiting   Associated symptoms: no abdominal pain, no back pain, no cough, no fatigue and no headache   Risk factors: hypertension   Risk factors: no coronary artery disease and no diabetes mellitus     HPI Comments: Hannah Cooper is a 57 y.o. female who presents to the Emergency Department complaining of intermittent substernal CP described as pressure that radiates down the left arm with associated SOB and diaphoresis since yesterday. She reports that the pain was mild last night with the initial onset and resolved after taking 2 ASAs. She states that she went to bed and upon waking noticed the pain again. She reports that she took another 325 mg ASA this morning around 9 AM with minimal relief. She admits that she still feels the pain currently. Her risk factors include her h/o HTN and family h/o cardiac problems. She states that her mother died of a MI at the age of 42 and her sister died of a MI at the age of 22. She denies any prior Cardiologist evaluations. Pt denies smoking and alcohol use.  Past Medical History  Diagnosis Date  . Meningitis   . Hypertension     Past Surgical  History  Procedure Laterality Date  . Abdominal hysterectomy    . Thyroid surgery  2002    No family history on file.  History  Substance Use Topics  . Smoking status: Never Smoker   . Smokeless tobacco: Never Used  . Alcohol Use: No    No OB history provided.  Review of Systems  Constitutional: Positive for diaphoresis. Negative for appetite change and fatigue.  HENT: Negative for congestion, sinus pressure and ear discharge.   Eyes: Negative for discharge.  Respiratory: Negative for cough.   Cardiovascular: Positive for chest pain. Negative for leg swelling.  Gastrointestinal: Positive for nausea and vomiting. Negative for abdominal pain and diarrhea.  Genitourinary: Negative for frequency and hematuria.  Musculoskeletal: Negative for back pain.  Skin: Negative for rash.  Neurological: Negative for seizures and headaches.  Psychiatric/Behavioral: Negative for hallucinations.    Allergies  Review of patient's allergies indicates no known allergies.  Home Medications   Current Outpatient Rx  Name  Route  Sig  Dispense  Refill  . EXPIRED: hydrochlorothiazide (HYDRODIURIL) 25 MG tablet   Oral   Take 25 mg by mouth every morning.         Marland Kitchen HYDROcodone-acetaminophen (NORCO/VICODIN) 5-325 MG per tablet   Oral   Take 1 tablet by mouth every 4 (four) hours as needed for pain.   15 tablet   0   . ibuprofen (  ADVIL,MOTRIN) 800 MG tablet   Oral   Take 800 mg by mouth every 8 (eight) hours as needed. Pain         . LORazepam (ATIVAN) 1 MG tablet   Oral   Take 1 tablet (1 mg total) by mouth 3 (three) times daily as needed for anxiety.   15 tablet   0     Triage Vitals: BP 152/72  Temp(Src) 98.7 F (37.1 C) (Oral)  Resp 20  Ht 5\' 7"  (1.702 m)  Wt 193 lb (87.544 kg)  BMI 30.22 kg/m2  SpO2 100%  Physical Exam  Nursing note and vitals reviewed. Constitutional: She is oriented to person, place, and time. She appears well-developed.  HENT:  Head:  Normocephalic.  Eyes: Conjunctivae and EOM are normal. No scleral icterus.  Neck: Neck supple. No thyromegaly present.  Cardiovascular: Normal rate and regular rhythm.  Exam reveals no gallop and no friction rub.   No murmur heard. Pulmonary/Chest: No stridor. She has no wheezes. She has no rales. She exhibits no tenderness.  Abdominal: She exhibits no distension. There is no tenderness. There is no rebound.  Musculoskeletal: Normal range of motion. She exhibits no edema.  Lymphadenopathy:    She has no cervical adenopathy.  Neurological: She is oriented to person, place, and time. Coordination normal.  Skin: No rash noted. No erythema.  Psychiatric: She has a normal mood and affect. Her behavior is normal.    ED Course  Procedures (including critical care time)  Medications  nitroGLYCERIN (NITROSTAT) SL tablet 0.4 mg (0.4 mg Sublingual Given 09/04/12 1655)  morphine 4 MG/ML injection 4 mg (4 mg Intravenous Given 09/04/12 1638)    DIAGNOSTIC STUDIES: Oxygen Saturation is 100% on room air, normal by my interpretation.    COORDINATION OF CARE: 4:34 PM-Discussed treatment plan which includes CXR, CBC panel, CMP, NTG and troponin with pt at bedside and pt agreed to plan.   5:40 PM-Pt rechecked and feels improved with medications listed above. She states that her CP is mild currently. She currently c/o HA from the NTG. Will order medications for the HA. Informed pt that her tests are normal, but that I recommend admission to f/u with Cardiology and pt agreed to plan.  Labs Reviewed  CBC WITH DIFFERENTIAL - Abnormal; Notable for the following:    WBC 3.7 (*)    All other components within normal limits  COMPREHENSIVE METABOLIC PANEL - Abnormal; Notable for the following:    Potassium 2.9 (*)    Total Protein 8.5 (*)    Total Bilirubin 0.2 (*)    All other components within normal limits  TROPONIN I   Dg Chest Port 1 View  09/04/2012   *RADIOLOGY REPORT*  Clinical Data: Chest and  left arm pain.  Nausea vomiting.  CHEST - 1 VIEW  Comparison:  04/16/2012  Findings: The heart size and mediastinal contours are within normal limits.  Both lungs are clear.  IMPRESSION: No active disease.   Original Report Authenticated By: Myles Rosenthal, M.D.     No diagnosis found.    Date: 09/04/2012  Rate: 62  Rhythm: normal sinus rhythm  QRS Axis: normal  Intervals: normal  ST/T Wave abnormalities: nonspecific ST changes  Conduction Disutrbances:none  Narrative Interpretation:   Old EKG Reviewed: unchanged    MDM      The chart was scribed for me under my direct supervision.  I personally performed the history, physical, and medical decision making and all procedures in the evaluation  of this patient.Hannah Lennert, MD 09/04/12 [phone]

## 2012-09-05 ENCOUNTER — Encounter (HOSPITAL_COMMUNITY)
Admission: EM | Disposition: A | Discharge status: Home / Self Care | Payer: Self-pay | Source: Home / Self Care | Attending: Emergency Medicine

## 2012-09-05 ENCOUNTER — Ambulatory Visit (HOSPITAL_COMMUNITY): Admit: 2012-09-05 | Payer: Self-pay | Admitting: Cardiovascular Disease

## 2012-09-05 ENCOUNTER — Encounter (HOSPITAL_COMMUNITY): Payer: Self-pay | Admitting: Internal Medicine

## 2012-09-05 DIAGNOSIS — R079 Chest pain, unspecified: Secondary | ICD-10-CM

## 2012-09-05 HISTORY — PX: LEFT HEART CATHETERIZATION WITH CORONARY ANGIOGRAM: SHX5451

## 2012-09-05 LAB — CBC
MCH: 29.9 pg (ref 26.0–34.0)
MCHC: 34 g/dL (ref 30.0–36.0)
MCV: 88 fL (ref 78.0–100.0)
Platelets: 221 10*3/uL (ref 150–400)
RDW: 13.6 % (ref 11.5–15.5)

## 2012-09-05 LAB — BASIC METABOLIC PANEL
BUN: 15 mg/dL (ref 6–23)
CO2: 30 mEq/L (ref 19–32)
Calcium: 8.7 mg/dL (ref 8.4–10.5)
Creatinine, Ser: 0.71 mg/dL (ref 0.50–1.10)
GFR calc non Af Amer: >90 mL/min (ref >90)
Glucose, Bld: 119 mg/dL — ABNORMAL HIGH (ref 70–99)

## 2012-09-05 LAB — TSH: TSH: 3.909 u[IU]/mL (ref 0.350–4.500)

## 2012-09-05 LAB — TROPONIN I: Troponin I: <0.3 ng/mL (ref <0.30)

## 2012-09-05 LAB — LIPID PANEL
HDL: 41 mg/dL (ref >39)
LDL Cholesterol: 68 mg/dL (ref 0–99)
Total CHOL/HDL Ratio: 3.3 RATIO

## 2012-09-05 LAB — PROTIME-INR: Prothrombin Time: 13.4 seconds (ref 11.6–15.2)

## 2012-09-05 SURGERY — LEFT HEART CATHETERIZATION WITH CORONARY ANGIOGRAM
Anesthesia: LOCAL

## 2012-09-05 MED ORDER — LIDOCAINE HCL (PF) 1 % IJ SOLN
INTRAMUSCULAR | Status: AC
  Filled 2012-09-05: qty 30

## 2012-09-05 MED ORDER — DIAZEPAM 5 MG PO TABS: 5.0000 mg | ORAL_TABLET | ORAL | Status: DC

## 2012-09-05 MED ORDER — FENTANYL CITRATE 0.05 MG/ML IJ SOLN
INTRAMUSCULAR | Status: AC
  Filled 2012-09-05: qty 2

## 2012-09-05 MED ORDER — SODIUM CHLORIDE 0.9 % IJ SOLN: 3.0000 mL | INTRAMUSCULAR | Status: DC | PRN

## 2012-09-05 MED ORDER — HEPARIN (PORCINE) IN NACL 2-0.9 UNIT/ML-% IJ SOLN
INTRAMUSCULAR | Status: AC
  Filled 2012-09-05: qty 1000

## 2012-09-05 MED ORDER — HYDROCODONE-ACETAMINOPHEN 5-325 MG PO TABS
1.0000 | ORAL_TABLET | Freq: Four times a day (QID) | ORAL | Status: DC | PRN
  Administered 2012-09-05 – 2012-09-06 (×2): 1 via ORAL
  Filled 2012-09-05 (×3): qty 1

## 2012-09-05 MED ORDER — ACETAMINOPHEN 325 MG PO TABS: 650.0000 mg | ORAL_TABLET | ORAL | Status: DC | PRN

## 2012-09-05 MED ORDER — CLOPIDOGREL BISULFATE 75 MG PO TABS: 75.0000 mg | ORAL_TABLET | ORAL | Status: DC

## 2012-09-05 MED ORDER — HEPARIN SODIUM (PORCINE) 1000 UNIT/ML IJ SOLN
INTRAMUSCULAR | Status: AC
  Filled 2012-09-05: qty 1

## 2012-09-05 MED ORDER — VERAPAMIL HCL 2.5 MG/ML IV SOLN
INTRAVENOUS | Status: AC
  Filled 2012-09-05: qty 2

## 2012-09-05 MED ORDER — SODIUM CHLORIDE 0.9 % IJ SOLN: 3.0000 mL | Freq: Two times a day (BID) | INTRAMUSCULAR | Status: DC

## 2012-09-05 MED ORDER — MIDAZOLAM HCL 2 MG/2ML IJ SOLN
INTRAMUSCULAR | Status: AC
  Filled 2012-09-05: qty 2

## 2012-09-05 MED ORDER — SODIUM CHLORIDE 0.9 % IV SOLN: 250.0000 mL | INTRAVENOUS | Status: DC | PRN

## 2012-09-05 NOTE — H&P (View-Only) (Signed)
Reason for Consult:unstable angina Referring Physician: Campbell  Hannah Cooper is an 57 y.o. female.  HPI: this is a 57-year-old female patient with no prior cardiac history who is admitted with chest pain consistent with unstable angina and hypokalemia with a potassium of 2.9(on HCTZ for HTN). CKs MBs and troponins are negative thus far. EKG showed normal sinus rhythm with nonspecific ST-T wave changes.   The patient was sitting down the other night when her chest pain started. She describes it as a heavy pressure like something was sitting on her chest. She took 2 aspirin and went to bed. She awakened at 2 AM with more chest pain but was able to fall asleep. She woke again at 5:30 in the morning and the chest tightness was present but not as severe. She start to iron and it became severe radiating down her left arm associated with diaphoresis and nausea. Her husband brought her to the emergency room where the pain was relieved with 3 nitroglycerin and morphine. She has had recurrent chest pain last evening . She admits to having this pain off-and-on for the past several months if she overexerts herself.  She has a history of hypertension and positive family history of coronary artery disease with her mother dying of an MI at 52 and sister dying of an MI at 49. She has never smoked. She has had prior near total thyroidectomy for multinodular goiter, and viral meningitis.  Past Medical History  Diagnosis Date  . Meningitis   . Hypertension     Past Surgical History  Procedure Laterality Date  . Abdominal hysterectomy    . Thyroid surgery  2002    Family History  Problem Relation Age of Onset  . Heart attack Mother 52  . Heart attack Sister 49    Social History:  reports that she has never smoked. She has never used smokeless tobacco. She reports that she does not drink alcohol or use illicit drugs.  Allergies: No Known Allergies  Medications:  Scheduled Meds: . aspirin EC  81  mg Oral Daily  . enoxaparin (LOVENOX) injection  40 mg Subcutaneous Q24H  . nitroGLYCERIN  1 inch Topical Custom  . pantoprazole (PROTONIX) IV  40 mg Intravenous Q24H  . sodium chloride  3 mL Intravenous Q12H   Continuous Infusions: . 0.9 % NaCl with KCl 20 mEq / L 125 mL/hr at 09/04/12 2107   PRN Meds:.acetaminophen, morphine injection, ondansetron (ZOFRAN) IV, traZODone   Results for orders placed during the hospital encounter of 09/04/12 (from the past 48 hour(s))  CBC WITH DIFFERENTIAL     Status: Abnormal   Collection Time    09/04/12  4:34 PM      Result Value Range   WBC 3.7 (*) 4.0 - 10.5 K/uL   RBC 4.11  3.87 - 5.11 MIL/uL   Hemoglobin 12.4  12.0 - 15.0 g/dL   HCT 36.2  36.0 - 46.0 %   MCV 88.1  78.0 - 100.0 fL   MCH 30.2  26.0 - 34.0 pg   MCHC 34.3  30.0 - 36.0 g/dL   RDW 13.6  11.5 - 15.5 %   Platelets 259  150 - 400 K/uL   Neutrophils Relative % 51  43 - 77 %   Neutro Abs 1.9  1.7 - 7.7 K/uL   Lymphocytes Relative 41  12 - 46 %   Lymphs Abs 1.5  0.7 - 4.0 K/uL   Monocytes Relative 6  3 - 12 %     Monocytes Absolute 0.2  0.1 - 1.0 K/uL   Eosinophils Relative 2  0 - 5 %   Eosinophils Absolute 0.1  0.0 - 0.7 K/uL   Basophils Relative 1  0 - 1 %   Basophils Absolute 0.0  0.0 - 0.1 K/uL  COMPREHENSIVE METABOLIC PANEL     Status: Abnormal   Collection Time    09/04/12  4:34 PM      Result Value Range   Sodium 142  135 - 145 mEq/L   Potassium 2.9 (*) 3.5 - 5.1 mEq/L   Chloride 102  96 - 112 mEq/L   CO2 29  19 - 32 mEq/L   Glucose, Bld 79  70 - 99 mg/dL   BUN 15  6 - 23 mg/dL   Creatinine, Ser 0.77  0.50 - 1.10 mg/dL   Calcium 9.9  8.4 - 10.5 mg/dL   Total Protein 8.5 (*) 6.0 - 8.3 g/dL   Albumin 4.1  3.5 - 5.2 g/dL   AST 23  0 - 37 U/L   ALT 30  0 - 35 U/L   Alkaline Phosphatase 73  39 - 117 U/L   Total Bilirubin 0.2 (*) 0.3 - 1.2 mg/dL   GFR calc non Af Amer >90  >90 mL/min   GFR calc Af Amer >90  >90 mL/min   Comment:            The eGFR has been  calculated     using the CKD EPI equation.     This calculation has not been     validated in all clinical     situations.     eGFR's persistently     <90 mL/min signify     possible Chronic Kidney Disease.  TROPONIN I     Status: None   Collection Time    09/04/12  4:34 PM      Result Value Range   Troponin I <0.30  <0.30 ng/mL   Comment:            Due to the release kinetics of cTnI,     a negative result within the first hours     of the onset of symptoms does not rule out     myocardial infarction with certainty.     If myocardial infarction is still suspected,     repeat the test at appropriate intervals.  TROPONIN I     Status: None   Collection Time    09/04/12  8:57 PM      Result Value Range   Troponin I <0.30  <0.30 ng/mL   Comment:            Due to the release kinetics of cTnI,     a negative result within the first hours     of the onset of symptoms does not rule out     myocardial infarction with certainty.     If myocardial infarction is still suspected,     repeat the test at appropriate intervals.  MAGNESIUM     Status: None   Collection Time    09/04/12  8:57 PM      Result Value Range   Magnesium 1.8  1.5 - 2.5 mg/dL  TROPONIN I     Status: None   Collection Time    09/05/12  2:22 AM      Result Value Range   Troponin I <0.30  <0.30 ng/mL   Comment:              Due to the release kinetics of cTnI,     a negative result within the first hours     of the onset of symptoms does not rule out     myocardial infarction with certainty.     If myocardial infarction is still suspected,     repeat the test at appropriate intervals.  BASIC METABOLIC PANEL     Status: Abnormal   Collection Time    09/05/12  2:22 AM      Result Value Range   Sodium 140  135 - 145 mEq/L   Potassium 3.7  3.5 - 5.1 mEq/L   Comment: DELTA CHECK NOTED   Chloride 105  96 - 112 mEq/L   CO2 30  19 - 32 mEq/L   Glucose, Bld 119 (*) 70 - 99 mg/dL   BUN 15  6 - 23 mg/dL    Creatinine, Ser 0.71  0.50 - 1.10 mg/dL   Calcium 8.7  8.4 - 10.5 mg/dL   GFR calc non Af Amer >90  >90 mL/min   GFR calc Af Amer >90  >90 mL/min   Comment:            The eGFR has been calculated     using the CKD EPI equation.     This calculation has not been     validated in all clinical     situations.     eGFR's persistently     <90 mL/min signify     possible Chronic Kidney Disease.  CBC     Status: Abnormal   Collection Time    09/05/12  2:22 AM      Result Value Range   WBC 3.3 (*) 4.0 - 10.5 K/uL   RBC 3.68 (*) 3.87 - 5.11 MIL/uL   Hemoglobin 11.0 (*) 12.0 - 15.0 g/dL   HCT 32.4 (*) 36.0 - 46.0 %   MCV 88.0  78.0 - 100.0 fL   MCH 29.9  26.0 - 34.0 pg   MCHC 34.0  30.0 - 36.0 g/dL   RDW 13.6  11.5 - 15.5 %   Platelets 221  150 - 400 K/uL  LIPID PANEL     Status: None   Collection Time    09/05/12  2:26 AM      Result Value Range   Cholesterol 137  0 - 200 mg/dL   Triglycerides 142  <150 mg/dL   HDL 41  >39 mg/dL   Total CHOL/HDL Ratio 3.3     VLDL 28  0 - 40 mg/dL   LDL Cholesterol 68  0 - 99 mg/dL   Comment:            Total Cholesterol/HDL:CHD Risk     Coronary Heart Disease Risk Table                         Men   Women      1/2 Average Risk   3.4   3.3      Average Risk       5.0   4.4      2 X Average Risk   9.6   7.1      3 X Average Risk  23.4   11.0                Use the calculated Patient Ratio     above and the CHD Risk Table     to determine the patient's CHD   Risk.                ATP III CLASSIFICATION (LDL):      <100     mg/dL   Optimal      100-129  mg/dL   Near or Above                        Optimal      130-159  mg/dL   Borderline      160-189  mg/dL   High      >190     mg/dL   Very High    Dg Chest Port 1 View  09/04/2012   *RADIOLOGY REPORT*  Clinical Data: Chest and left arm pain.  Nausea vomiting.  CHEST - 1 VIEW  Comparison:  04/16/2012  Findings: The heart size and mediastinal contours are within normal limits.  Both lungs  are clear.  IMPRESSION: No active disease.   Original Report Authenticated By: John Stahl, M.D.    ROS See HPI Eyes: Negative Ears:Negative for hearing loss, tinnitus Cardiovascular: Positvie for chest pain over past several months, Negative for palpitations,irregular heartbeat, dyspnea, near-syncope, orthopnea, paroxysmal nocturnal dyspnea and syncope, claudication, cyanosis,. positive for recent ankle edema Respiratory:   Negative for cough, hemoptysis, shortness of breath, sleep disturbances due to breathing, sputum production and wheezing.   Endocrine: Negative for cold intolerance and heat intolerance.  Hematologic/Lymphatic: Negative for adenopathy and bleeding problem. Does not bruise/bleed easily.  Musculoskeletal:recent trouble with her knees limiting exercise.   Gastrointestinal: Positive for nausea with resent chest pain,negative for vomiting, reflux, abdominal pain, diarrhea, constipation.   Genitourinary: Negative for bladder incontinence, dysuria, flank pain, frequency, hematuria, hesitancy, nocturia and urgency.  Neurological: Negative.  Allergic/Immunologic: Negative for environmental allergies.  Blood pressure 112/65, pulse 87, temperature 97.9 F (36.6 C), temperature source Oral, resp. rate 20, height 5' 7" (1.702 m), weight 222 lb 6.4 oz (100.88 kg), SpO2 100.00%. Physical Exam PHYSICAL EXAM: Well-nournished, in no acute distress. Neck: No JVD, HJR, Bruit, or thyroid enlargement Lungs: No tachypnea, clear without wheezing, rales, or rhonchi Cardiovascular: RRR, PMI not displaced, positive S4, no murmur, bruit, thrill, or heave. Abdomen: BS normal. Soft without organomegaly, masses, lesions or tenderness. Extremities: without cyanosis, clubbing or edema. Good distal pulses bilateral SKin: Warm, no lesions or rashes  Musculoskeletal: No deformities Neuro: no focal signs  Assessment/Plan: 1.Chest pain consistent with unstable angina. Transferred to [place] today  for cardiac catheterization. Risk and benefits have been discussed and patient is agreeable. Add low dose beta blocker. 2. HTN 3. Family hx CAD 4. S/P Thyroidectomy 5. Hx viral meningitis 5. Anemia 6. Hypokalemia on HCTZ resolved Michele Lenze 09/05/2012, 8:23 AM    Attending note:  Patient seen and examined. Discussed the case with Ms. Lenze PAC. Patient presents with increasing episodes of chest heaviness, noted intermittently over the last few months, but much worse within the last few weeks. She presented to the hospital with progressive symptoms within the last 24-48 hours as detailed above. Cardiac risk factors include hypertension and a significant family history of premature cardiovascular disease including her mother, and 2 sisters.  She has had some recurrent chest pain overnight on anticoagulation. Otherwise is comfortable this morning, lungs are clear, non-labored breathing, cardiac exam without gallop or murmur. Her ECG shows no acute ST segment changes and cardiac markers are normal. Chest x-ray shows no acute disease process.  Symptoms are concerning for unstable angina in the absence   of dynamic ST segment changes or abnormal cardiac markers. In light of her history of hypertension as well as significant family history of premature CAD, her pretest probability is at least in the moderate range. We discussed diagnostic techniques, and plan will be transfer to [facility] in anticipation of a diagnostic cardiac catheterization. We discussed the risk and benefits, and she is in agreement to proceed.  Samuel G. McDowell, M.D., F.A.C.C.   

## 2012-09-05 NOTE — Progress Notes (Signed)
UR chart review completed.  

## 2012-09-05 NOTE — Interval H&P Note (Signed)
History and Physical Interval Note:  09/05/2012 6:06 PM  Hannah Cooper  has presented today for surgery, with the diagnosis of cp  The various methods of treatment have been discussed with the patient and family. After consideration of risks, benefits and other options for treatment, the patient has consented to  Procedure(s): LEFT HEART CATHETERIZATION WITH CORONARY ANGIOGRAM (N/A) as a surgical intervention .  The patient's history has been reviewed, patient examined, no change in status, stable for surgery.  I have reviewed the patient's chart and labs.  Questions were answered to the patient's satisfaction.     Lorine Bears

## 2012-09-05 NOTE — CV Procedure (Signed)
   Cardiac Catheterization Procedure Note  Name: Hannah Cooper MRN: [id number] DOB: 10/04/1955  Procedure: Left Heart Cath, Selective Coronary Angiography, LV angiography  Indication: Chest pain possible unstable angina.  Medications:  Sedation:  1 mg IV Versed, 25 mcg IV Fentanyl  Contrast:  80 ml  Omnipaque   Procedural Details: The right wrist was prepped, draped, and anesthetized with 1% lidocaine. Using the modified Seldinger technique, a 5 French sheath was introduced into the right radial artery. 3 mg of verapamil was administered through the sheath, weight-based unfractionated heparin was administered intravenously. A STANDARD JUDKINS catheters was used for selective coronary angiography. A pigtail catheter was used for left ventriculography. Catheter exchanges were performed over an exchange length guidewire. There were no immediate procedural complications. A TR band was used for radial hemostasis at the completion of the procedure.  The patient was transferred to the post catheterization recovery area for further monitoring.  Procedural Findings:  Hemodynamics: AO:  164/82   mmHg LV:  169/14    mmHg LVEDP: 24  mmHg  Coronary angiography: Coronary dominance: Right   Left Main:  Normal  Left Anterior Descending (LAD):  Normal in size with no significant disease.  1st diagonal (D1):  Small in size with minor irregularities.  2nd diagonal (D2):  Normal in size with no significant disease.  3rd diagonal (D3):  Normal in size with no significant disease.  Circumflex (LCx):  Normal in size and nondominant. The vessel has no significant disease.  1st obtuse marginal:  Small in size with no significant disease.  2nd obtuse marginal:  Small in size with no significant disease.  3rd obtuse marginal:  Large in size with no significant disease.   Right Coronary Artery: Normal in size and dominant. The vessel has no significant disease.  posterior descending artery:  Normal in size with no significant disease.  posterior lateral branchs:  Normal in size with no significant disease.  Left ventriculography: Left ventricular systolic function is normal , LVEF is estimated at 60-65% %, there is no significant mitral regurgitation   Final Conclusions:   1. Normal coronary arteries. 2. Normal LV systolic function. 3. Moderately elevated left ventricular end-diastolic pressure likely due to diastolic heart failure.  Recommendations:  I recommend blood pressure control. Recheck potassium in the morning. She will need a different antihypertensive medication and can likely use an ACE inhibitor or an ARB. Consider screening for sleep apnea.  Lorine Bears MD, [facility] 09/05/2012, 6:59 PM

## 2012-09-05 NOTE — Consult Note (Signed)
Reason for Consult:unstable angina Referring Physician: [name] is an 57 y.o. female.  HPI: this is a 57 year old female patient with no prior cardiac history who is admitted with chest pain consistent with unstable angina and hypokalemia with a potassium of 2.9(on HCTZ for HTN). CKs MBs and troponins are negative thus far. EKG showed normal sinus rhythm with nonspecific ST-T wave changes.   The patient was sitting down the other night when her chest pain started. She describes it as a heavy pressure like something was sitting on her chest. She took 2 aspirin and went to bed. She awakened at 2 AM with more chest pain but was able to fall asleep. She woke again at 5:30 in the morning and the chest tightness was present but not as severe. She start to iron and it became severe radiating down her left arm associated with diaphoresis and nausea. Her husband brought her to the emergency room where the pain was relieved with 3 nitroglycerin and morphine. She has had recurrent chest pain last evening . She admits to having this pain off-and-on for the past several months if she overexerts herself.  She has a history of hypertension and positive family history of coronary artery disease with her mother dying of an MI at 56 and sister dying of an MI at 45. She has never smoked. She has had prior near total thyroidectomy for multinodular goiter, and viral meningitis.  Past Medical History  Diagnosis Date  . Meningitis   . Hypertension     Past Surgical History  Procedure Laterality Date  . Abdominal hysterectomy    . Thyroid surgery  2002    Family History  Problem Relation Age of Onset  . Heart attack Mother 8  . Heart attack Sister 66    Social History:  reports that she has never smoked. She has never used smokeless tobacco. She reports that she does not drink alcohol or use illicit drugs.  Allergies: No Known Allergies  Medications:  Scheduled Meds: . aspirin EC  81  mg Oral Daily  . enoxaparin (LOVENOX) injection  40 mg Subcutaneous Q24H  . nitroGLYCERIN  1 inch Topical Custom  . pantoprazole (PROTONIX) IV  40 mg Intravenous Q24H  . sodium chloride  3 mL Intravenous Q12H   Continuous Infusions: . 0.9 % NaCl with KCl 20 mEq / L 125 mL/hr at 09/04/12 2107   PRN Meds:.acetaminophen, morphine injection, ondansetron (ZOFRAN) IV, traZODone   Results for orders placed during the hospital encounter of 09/04/12 (from the past 48 hour(s))  CBC WITH DIFFERENTIAL     Status: Abnormal   Collection Time    09/04/12  4:34 PM      Result Value Range   WBC 3.7 (*) 4.0 - 10.5 K/uL   RBC 4.11  3.87 - 5.11 MIL/uL   Hemoglobin 12.4  12.0 - 15.0 g/dL   HCT 16.1  09.6 - 04.5 %   MCV 88.1  78.0 - 100.0 fL   MCH 30.2  26.0 - 34.0 pg   MCHC 34.3  30.0 - 36.0 g/dL   RDW 40.9  81.1 - 91.4 %   Platelets 259  150 - 400 K/uL   Neutrophils Relative % 51  43 - 77 %   Neutro Abs 1.9  1.7 - 7.7 K/uL   Lymphocytes Relative 41  12 - 46 %   Lymphs Abs 1.5  0.7 - 4.0 K/uL   Monocytes Relative 6  3 - 12 %  Monocytes Absolute 0.2  0.1 - 1.0 K/uL   Eosinophils Relative 2  0 - 5 %   Eosinophils Absolute 0.1  0.0 - 0.7 K/uL   Basophils Relative 1  0 - 1 %   Basophils Absolute 0.0  0.0 - 0.1 K/uL  COMPREHENSIVE METABOLIC PANEL     Status: Abnormal   Collection Time    09/04/12  4:34 PM      Result Value Range   Sodium 142  135 - 145 mEq/L   Potassium 2.9 (*) 3.5 - 5.1 mEq/L   Chloride 102  96 - 112 mEq/L   CO2 29  19 - 32 mEq/L   Glucose, Bld 79  70 - 99 mg/dL   BUN 15  6 - 23 mg/dL   Creatinine, Ser 1.47  0.50 - 1.10 mg/dL   Calcium 9.9  8.4 - 82.9 mg/dL   Total Protein 8.5 (*) 6.0 - 8.3 g/dL   Albumin 4.1  3.5 - 5.2 g/dL   AST 23  0 - 37 U/L   ALT 30  0 - 35 U/L   Alkaline Phosphatase 73  39 - 117 U/L   Total Bilirubin 0.2 (*) 0.3 - 1.2 mg/dL   GFR calc non Af Amer >90  >90 mL/min   GFR calc Af Amer >90  >90 mL/min   Comment:            The eGFR has been  calculated     using the CKD EPI equation.     This calculation has not been     validated in all clinical     situations.     eGFR's persistently     <90 mL/min signify     possible Chronic Kidney Disease.  TROPONIN I     Status: None   Collection Time    09/04/12  4:34 PM      Result Value Range   Troponin I <0.30  <0.30 ng/mL   Comment:            Due to the release kinetics of cTnI,     a negative result within the first hours     of the onset of symptoms does not rule out     myocardial infarction with certainty.     If myocardial infarction is still suspected,     repeat the test at appropriate intervals.  TROPONIN I     Status: None   Collection Time    09/04/12  8:57 PM      Result Value Range   Troponin I <0.30  <0.30 ng/mL   Comment:            Due to the release kinetics of cTnI,     a negative result within the first hours     of the onset of symptoms does not rule out     myocardial infarction with certainty.     If myocardial infarction is still suspected,     repeat the test at appropriate intervals.  MAGNESIUM     Status: None   Collection Time    09/04/12  8:57 PM      Result Value Range   Magnesium 1.8  1.5 - 2.5 mg/dL  TROPONIN I     Status: None   Collection Time    09/05/12  2:22 AM      Result Value Range   Troponin I <0.30  <0.30 ng/mL   Comment:  Due to the release kinetics of cTnI,     a negative result within the first hours     of the onset of symptoms does not rule out     myocardial infarction with certainty.     If myocardial infarction is still suspected,     repeat the test at appropriate intervals.  BASIC METABOLIC PANEL     Status: Abnormal   Collection Time    09/05/12  2:22 AM      Result Value Range   Sodium 140  135 - 145 mEq/L   Potassium 3.7  3.5 - 5.1 mEq/L   Comment: DELTA CHECK NOTED   Chloride 105  96 - 112 mEq/L   CO2 30  19 - 32 mEq/L   Glucose, Bld 119 (*) 70 - 99 mg/dL   BUN 15  6 - 23 mg/dL    Creatinine, Ser 1.61  0.50 - 1.10 mg/dL   Calcium 8.7  8.4 - 09.6 mg/dL   GFR calc non Af Amer >90  >90 mL/min   GFR calc Af Amer >90  >90 mL/min   Comment:            The eGFR has been calculated     using the CKD EPI equation.     This calculation has not been     validated in all clinical     situations.     eGFR's persistently     <90 mL/min signify     possible Chronic Kidney Disease.  CBC     Status: Abnormal   Collection Time    09/05/12  2:22 AM      Result Value Range   WBC 3.3 (*) 4.0 - 10.5 K/uL   RBC 3.68 (*) 3.87 - 5.11 MIL/uL   Hemoglobin 11.0 (*) 12.0 - 15.0 g/dL   HCT 04.5 (*) 40.9 - 81.1 %   MCV 88.0  78.0 - 100.0 fL   MCH 29.9  26.0 - 34.0 pg   MCHC 34.0  30.0 - 36.0 g/dL   RDW 91.4  78.2 - 95.6 %   Platelets 221  150 - 400 K/uL  LIPID PANEL     Status: None   Collection Time    09/05/12  2:26 AM      Result Value Range   Cholesterol 137  0 - 200 mg/dL   Triglycerides 213  <086 mg/dL   HDL 41  >57 mg/dL   Total CHOL/HDL Ratio 3.3     VLDL 28  0 - 40 mg/dL   LDL Cholesterol 68  0 - 99 mg/dL   Comment:            Total Cholesterol/HDL:CHD Risk     Coronary Heart Disease Risk Table                         Men   Women      1/2 Average Risk   3.4   3.3      Average Risk       5.0   4.4      2 X Average Risk   9.6   7.1      3 X Average Risk  23.4   11.0                Use the calculated Patient Ratio     above and the CHD Risk Table     to determine the patient's CHD  Risk.                ATP III CLASSIFICATION (LDL):      <100     mg/dL   Optimal      308-657  mg/dL   Near or Above                        Optimal      130-159  mg/dL   Borderline      846-962  mg/dL   High      >952     mg/dL   Very High    Dg Chest Port 1 View  09/04/2012   *RADIOLOGY REPORT*  Clinical Data: Chest and left arm pain.  Nausea vomiting.  CHEST - 1 VIEW  Comparison:  04/16/2012  Findings: The heart size and mediastinal contours are within normal limits.  Both lungs  are clear.  IMPRESSION: No active disease.   Original Report Authenticated By: Myles Rosenthal, M.D.    ROS See HPI Eyes: Negative Ears:Negative for hearing loss, tinnitus Cardiovascular: Positvie for chest pain over past several months, Negative for palpitations,irregular heartbeat, dyspnea, near-syncope, orthopnea, paroxysmal nocturnal dyspnea and syncope, claudication, cyanosis,. positive for recent ankle edema Respiratory:   Negative for cough, hemoptysis, shortness of breath, sleep disturbances due to breathing, sputum production and wheezing.   Endocrine: Negative for cold intolerance and heat intolerance.  Hematologic/Lymphatic: Negative for adenopathy and bleeding problem. Does not bruise/bleed easily.  Musculoskeletal:recent trouble with her knees limiting exercise.   Gastrointestinal: Positive for nausea with resent chest pain,negative for vomiting, reflux, abdominal pain, diarrhea, constipation.   Genitourinary: Negative for bladder incontinence, dysuria, flank pain, frequency, hematuria, hesitancy, nocturia and urgency.  Neurological: Negative.  Allergic/Immunologic: Negative for environmental allergies.  Blood pressure 112/65, pulse 87, temperature 97.9 F (36.6 C), temperature source Oral, resp. rate 20, height 5\' 7"  (1.702 m), weight 222 lb 6.4 oz (100.88 kg), SpO2 100.00%. Physical Exam PHYSICAL EXAM: Well-nournished, in no acute distress. Neck: No JVD, HJR, Bruit, or thyroid enlargement Lungs: No tachypnea, clear without wheezing, rales, or rhonchi Cardiovascular: RRR, PMI not displaced, positive S4, no murmur, bruit, thrill, or heave. Abdomen: BS normal. Soft without organomegaly, masses, lesions or tenderness. Extremities: without cyanosis, clubbing or edema. Good distal pulses bilateral SKin: Warm, no lesions or rashes  Musculoskeletal: No deformities Neuro: no focal signs  Assessment/Plan: 1.Chest pain consistent with unstable angina. Transferred to [facility] today  for cardiac catheterization. Risk and benefits have been discussed and patient is agreeable. Add low dose beta blocker. 2. HTN 3. Family hx CAD 4. S/P Thyroidectomy 5. Hx viral meningitis 5. Anemia 6. Hypokalemia on HCTZ resolved Jacolyn Reedy 09/05/2012, 8:23 AM    Attending note:  Patient seen and examined. Discussed the case with Hannah Cooper [facility]. Patient presents with increasing episodes of chest heaviness, noted intermittently over the last few months, but much worse within the last few weeks. She presented to the hospital with progressive symptoms within the last 24-48 hours as detailed above. Cardiac risk factors include hypertension and a significant family history of premature cardiovascular disease including her mother, and 2 sisters.  She has had some recurrent chest pain overnight on anticoagulation. Otherwise is comfortable this morning, lungs are clear, non-labored breathing, cardiac exam without gallop or murmur. Her ECG shows no acute ST segment changes and cardiac markers are normal. Chest x-ray shows no acute disease process.  Symptoms are concerning for unstable angina in the absence  of dynamic ST segment changes or abnormal cardiac markers. In light of her history of hypertension as well as significant family history of premature CAD, her pretest probability is at least in the moderate range. We discussed diagnostic techniques, and plan will be transfer to [facility] in anticipation of a diagnostic cardiac catheterization. We discussed the risk and benefits, and she is in agreement to proceed.  Jonelle Sidle, M.D., F.A.C.C.

## 2012-09-06 DIAGNOSIS — R079 Chest pain, unspecified: Secondary | ICD-10-CM

## 2012-09-06 LAB — BASIC METABOLIC PANEL WITH GFR
BUN: 12 mg/dL (ref 6–23)
CO2: 23 meq/L (ref 19–32)
Calcium: 8.8 mg/dL (ref 8.4–10.5)
Chloride: 105 meq/L (ref 96–112)
Creatinine, Ser: 0.67 mg/dL (ref 0.50–1.10)
GFR calc Af Amer: >90 mL/min
GFR calc non Af Amer: >90 mL/min
Glucose, Bld: 96 mg/dL (ref 70–99)
Potassium: 3.9 meq/L (ref 3.5–5.1)
Sodium: 139 meq/L (ref 135–145)

## 2012-09-06 MED ORDER — POTASSIUM CHLORIDE ER 10 MEQ PO TBCR: 10.0000 meq | EXTENDED_RELEASE_TABLET | Freq: Every day | ORAL | Status: DC

## 2012-09-06 MED ORDER — HYDROCHLOROTHIAZIDE 25 MG PO TABS: 12.5000 mg | ORAL_TABLET | Freq: Every day | ORAL | Status: DC

## 2012-09-06 MED ORDER — LISINOPRIL 10 MG PO TABS
10.0000 mg | ORAL_TABLET | Freq: Every day | ORAL | Status: DC
Start: 2012-09-06 — End: 2012-09-06
  Administered 2012-09-06: 10 mg via ORAL
  Filled 2012-09-06: qty 1

## 2012-09-06 MED ORDER — LISINOPRIL 10 MG PO TABS: 10.0000 mg | ORAL_TABLET | Freq: Every day | ORAL | Status: DC

## 2012-09-06 NOTE — Progress Notes (Signed)
TR BAND REMOVAL  LOCATION:    right radial  DEFLATED PER PROTOCOL:    yes  TIME BAND OFF / DRESSING APPLIED:    23:30   SITE UPON ARRIVAL:    Level 0  SITE AFTER BAND REMOVAL:    Level 0  REVERSE ALLEN'S TEST:     positive  CIRCULATION SENSATION AND MOVEMENT:    Within Normal Limits   yes  COMMENTS:   Pt tolerated removal of TR band without complications, will continue to monitor pt and right radial

## 2012-09-06 NOTE — Progress Notes (Signed)
Patient Name: Hannah Cooper Date of Encounter: 09/06/2012  Principal Problem:   Unstable angina Active Problems:   HYPERTENSION   Hypokalemia    SUBJECTIVE: No chest pain, no SOB. Feels much better today. Took HCTZ for edema and BP  OBJECTIVE Filed Vitals:   09/05/12 1313 09/05/12 1802 09/05/12 2014 09/06/12 0012  BP: 139/87  150/81 124/65  Pulse: 56 57 61 71  Temp: 98.3 F (36.8 C)  98.4 F (36.9 C) 98.9 F (37.2 C)  TempSrc: Oral  Oral Oral  Resp: 18  16 15   Height: 5\' 8"  (1.727 m)     Weight: 215 lb 4.8 oz (97.659 kg)   216 lb 7.9 oz (98.2 kg)  SpO2: 99%  98% 94%    Intake/Output Summary (Last 24 hours) at 09/06/12 1610 Last data filed at 09/06/12 0206  Gross per 24 hour  Intake    480 ml  Output    200 ml  Net    280 ml   Filed Weights   09/05/12 0611 09/05/12 1313 09/06/12 0012  Weight: 222 lb 6.4 oz (100.88 kg) 215 lb 4.8 oz (97.659 kg) 216 lb 7.9 oz (98.2 kg)    PHYSICAL EXAM General: Well developed, well nourished, female in no acute distress. Head: Normocephalic, atraumatic.  Neck: Supple without bruits, JVD not elevated. Lungs:  Resp regular and unlabored, few rales bases. Heart: RRR, S1, S2, no S3, S4, or murmur; no rub. Abdomen: Soft, non-tender, non-distended, BS + x 4.  Extremities: No clubbing, cyanosis, no edema. Right radial without ecchymosis or hematoma Neuro: Alert and oriented X 3. Moves all extremities spontaneously. Psych: Normal affect.  LABS: CBC: Recent Labs  09/04/12 1634 09/05/12 0222  WBC 3.7* 3.3*  NEUTROABS 1.9  --   HGB 12.4 11.0*  HCT 36.2 32.4*  MCV 88.1 88.0  PLT 259 221   INR: Recent Labs  09/05/12 1049  INR 1.03   Basic Metabolic Panel: Recent Labs  09/04/12 1634 09/04/12 2057 09/05/12 0222  NA 142  --  140  K 2.9*  --  3.7  CL 102  --  105  CO2 29  --  30  GLUCOSE 79  --  119*  BUN 15  --  15  CREATININE 0.77  --  0.71  CALCIUM 9.9  --  8.7  MG  --  1.8  --    Liver Function  Tests: Recent Labs  09/04/12 1634  AST 23  ALT 30  ALKPHOS 73  BILITOT 0.2*  PROT 8.5*  ALBUMIN 4.1   Cardiac Enzymes: Recent Labs  09/04/12 2057 09/05/12 0222 09/05/12 0855  TROPONINI <0.30 <0.30 <0.30    Hemoglobin A1C: Recent Labs  09/04/12 2057  HGBA1C 5.7*   Fasting Lipid Panel: Recent Labs  09/05/12 0226  CHOL 137  HDL 41  LDLCALC 68  TRIG 142  CHOLHDL 3.3   Thyroid Function Tests: Recent Labs  09/04/12 2057  TSH 3.909   TELE:  SR, Sbrady   Radiology/Studies: Dg Chest Port 1 View  09/04/2012   *RADIOLOGY REPORT*  Clinical Data: Chest and left arm pain.  Nausea vomiting.  CHEST - 1 VIEW  Comparison:  04/16/2012  Findings: The heart size and mediastinal contours are within normal limits.  Both lungs are clear.  IMPRESSION: No active disease.   Original Report Authenticated By: Myles Rosenthal, M.D.     Current Medications:  . aspirin EC  81 mg Oral Daily  . nitroGLYCERIN  1 inch Topical  Custom  . pantoprazole (PROTONIX) IV  40 mg Intravenous Q24H  . sodium chloride  3 mL Intravenous Q12H   . 0.9 % NaCl with KCl 20 mEq / L 125 mL/hr at 09/04/12 2107    ASSESSMENT AND PLAN: Principal Problem:   Unstable angina - Pt without any CAD. Symptoms possibly from hypokalemia.  Active Problems:   HYPERTENSION - patient states her blood pressure was under good control on the HCTZ. She also has problems with lower extremity edema which the HCTZ else. She had elevated left ventricular end-diastolic pressure likely due to diastolic heart failure at cath. Will add lisinopril to her medication regimen and MD advise on pt using her HCTZ 25 mg tablets at one half per day with 20 mEq of potassium when necessary for swelling.    Hypokalemia - supplemented and he improved. Patient had not had any recent labs.  Plan: Discharge later today.  SignedTheodore Demark , PA-C 6:39 AM 09/06/2012  Agree with above.  Needs daily diuretic and K  Continue ACE would use HCTZ 12.5  and KCL 10  Ok to discharge home  Charlton Haws

## 2012-09-06 NOTE — Discharge Summary (Signed)
CARDIOLOGY DISCHARGE SUMMARY   Patient ID: Hannah Cooper MRN: [id number] DOB/AGE: 58-18-1957 58 y.o.  Admit date: 09/04/2012 Discharge date: 09/06/2012  Primary Discharge Diagnosis:  Precordial pain  Secondary Discharge Diagnosis:    HYPERTENSION   Hypokalemia  Procedures: Left Heart Cath, Selective Coronary Angiography, LV angiography   Hospital Course: Hannah Cooper is a 57 y.o. female with no history of CAD. She had acute onset of chest pain that did not resolve. This was associated with nausea vomiting and diaphoresis. She came to the emergency room in her chest pain improved with sublingual nitroglycerin. She was admitted for further evaluation and treatment.  Her potassium was extremely low 2.9. She had extensive IV supplementation and her potassium level improved. Her chest pain was treated medically with aspirin, nitrates, and narcotics. Her chest pain improved. She has multiple cardiac risk factors so cardiac catheterization was indicated to further define her anatomy. She was taken to the cath lab on 09/05/2012. She had normal coronary arteries but her left ventricular end-diastolic pressure was elevated, indicating probable diastolic heart failure.  Her chest pain did not return. Her potassium level stayed within normal limits of the diuretic. On 09/06/2012, she was seen by Dr. Eden Emms. She has significant problems with edema and so we'll be on a lower dose of diuretic with potassium supplementation. She was ambulating without chest pain or shortness of breath and considered stable for discharge, in improved condition, to follow up as an outpatient in [place].  Labs:   Lab Results  Component Value Date   WBC 3.3* 09/05/2012   HGB 11.0* 09/05/2012   HCT 32.4* 09/05/2012   MCV 88.0 09/05/2012   PLT 221 09/05/2012    Recent Labs Lab 09/04/12 1634  09/06/12 0545  NA 142  < > 139  K 2.9*  < > 3.9  CL 102  < > 105  CO2 29  < > 23  BUN 15  < > 12  CREATININE  0.77  < > 0.67  CALCIUM 9.9  < > 8.8  PROT 8.5*  --   --   BILITOT 0.2*  --   --   ALKPHOS 73  --   --   ALT 30  --   --   AST 23  --   --   GLUCOSE 79  < > 96  < > = values in this interval not displayed.  Recent Labs  09/04/12 2057 09/05/12 0222 09/05/12 0855  TROPONINI <0.30 <0.30 <0.30   Lipid Panel     Component Value Date/Time   CHOL 137 09/05/2012 0226   TRIG 142 09/05/2012 0226   HDL 41 09/05/2012 0226   CHOLHDL 3.3 09/05/2012 0226   VLDL 28 09/05/2012 0226   LDLCALC 68 09/05/2012 0226    Recent Labs  09/05/12 1049  INR 1.03      Radiology: Dg Chest Port 1 View 09/04/2012   *RADIOLOGY REPORT*  Clinical Data: Chest and left arm pain.  Nausea vomiting.  CHEST - 1 VIEW  Comparison:  04/16/2012  Findings: The heart size and mediastinal contours are within normal limits.  Both lungs are clear.  IMPRESSION: No active disease.   Original Report Authenticated By: Myles Rosenthal, M.D.    Cardiac Cath: 09/05/2012 Left Main: Normal  Left Anterior Descending (LAD): Normal in size with no significant disease.  1st diagonal (D1): Small in size with minor irregularities.  2nd diagonal (D2): Normal in size with no significant disease.  3rd diagonal (D3): Normal  in size with no significant disease.  Circumflex (LCx): Normal in size and nondominant. The vessel has no significant disease.  1st obtuse marginal: Small in size with no significant disease.  2nd obtuse marginal: Small in size with no significant disease.  3rd obtuse marginal: Large in size with no significant disease.  Right Coronary Artery: Normal in size and dominant. The vessel has no significant disease.  posterior descending artery: Normal in size with no significant disease.  posterior lateral branchs: Normal in size with no significant disease. Left ventriculography: Left ventricular systolic function is normal , LVEF is estimated at 60-65% %, there is no significant mitral regurgitation  Final Conclusions:  1.  Normal coronary arteries.  2. Normal LV systolic function.  3. Moderately elevated left ventricular end-diastolic pressure likely due to diastolic heart failure.   EKG: 04-Sep-2012 16:28:41   Normal sinus rhythm Nonspecific T wave abnormality Abnormal ECG When compared with ECG of 16-Apr-2012 07:45, No significant change was found 83mm/s 66mm/mV 150Hz  8.0.1 12SL 241 HD CID: 0 Referred by: Confirmed By: Susy Frizzle MD Vent. rate 62 BPM PR interval 164 ms QRS duration 70 ms QT/QTc 418/424 ms P-R-T axes 59 47 -11   FOLLOW UP PLANS AND APPOINTMENTS No Known Allergies   Medication List    TAKE these medications       aspirin 325 MG tablet  Take 325-650 mg by mouth once as needed for pain.     hydrochlorothiazide 25 MG tablet  Commonly known as:  HYDRODIURIL  Take 0.5 tablets (12.5 mg total) by mouth daily.     lisinopril 10 MG tablet  Commonly known as:  PRINIVIL,ZESTRIL  Take 1 tablet (10 mg total) by mouth daily.     potassium chloride 10 MEQ tablet  Commonly known as:  K-DUR  Take 1 tablet (10 mEq total) by mouth daily.        Discharge Orders   Future Appointments Provider Department Dept Phone   09/20/2012 1:40 PM Jodelle Gross, NP [facility] [phone]   Future Orders Complete By Expires     Diet - low sodium heart healthy  As directed     Increase activity slowly  As directed       Follow-up Information   Follow up with Joni Reining, NP On 09/20/2012. (at 1:40 pm)    Contact information:   [address] [place] [address] [phone]       BRING ALL MEDICATIONS WITH YOU TO FOLLOW UP APPOINTMENTS  Time spent with patient to include physician time: 38 min Signed: Theodore Demark, PA-C 09/06/2012, 10:16 AM Co-Sign MD

## 2012-09-20 ENCOUNTER — Encounter: Payer: Self-pay | Admitting: Adult Health

## 2012-09-20 ENCOUNTER — Ambulatory Visit (INDEPENDENT_AMBULATORY_CARE_PROVIDER_SITE_OTHER): Payer: Medicaid Other | Admitting: Adult Health

## 2012-09-20 VITALS — BP 117/80 | HR 67 | Ht 68.0 in | Wt 204.1 lb

## 2012-09-20 DIAGNOSIS — R072 Precordial pain: Secondary | ICD-10-CM

## 2012-09-20 DIAGNOSIS — I10 Essential (primary) hypertension: Secondary | ICD-10-CM

## 2012-09-20 DIAGNOSIS — E870 Hyperosmolality and hypernatremia: Secondary | ICD-10-CM

## 2012-09-20 DIAGNOSIS — E039 Hypothyroidism, unspecified: Secondary | ICD-10-CM

## 2012-09-20 LAB — BASIC METABOLIC PANEL
BUN: 14 mg/dL (ref 6–23)
CO2: 28 mEq/L (ref 19–32)
Chloride: 104 mEq/L (ref 96–112)
Creat: 0.81 mg/dL (ref 0.50–1.10)
Potassium: 3.8 mEq/L (ref 3.5–5.3)

## 2012-09-20 MED ORDER — HYDROCHLOROTHIAZIDE 25 MG PO TABS: 12.5000 mg | ORAL_TABLET | Freq: Every day | ORAL | Status: DC

## 2012-09-20 NOTE — Assessment & Plan Note (Signed)
Recheck THS

## 2012-09-20 NOTE — Assessment & Plan Note (Signed)
Resolved

## 2012-09-20 NOTE — Progress Notes (Signed)
   HPI: [name] is a 57 y/o patient of Dr.Rothbart we are seeing for ongoing assessment of chest pain, s/p cardiac cath on 09/04/2012 demonstrating normal coronary arteries.She was found to have diastolic CHF. She was placed on low dose diuretic, HCTZ 25 mg and ACE 10 mg daily. Unfortunately she has been taking 37.5 mg of HCTZ instead of prescribed dose due to confusion of her discharge instructions.  She is tired and sometimes lightheaded. She continues on other medications as directed.      No Known Allergies  Current Outpatient Prescriptions  Medication Sig Dispense Refill  . aspirin 325 MG tablet Take 325-650 mg by mouth once as needed for pain.      . hydrochlorothiazide (HYDRODIURIL) 25 MG tablet Take 12.5 mg by mouth daily. PT STATES SHE IS TAKING 1 AND 1/2 TABS DAILY SINCE THE HOSPITAL      . lisinopril (PRINIVIL,ZESTRIL) 10 MG tablet Take 1 tablet (10 mg total) by mouth daily.  30 tablet  11  . potassium chloride (K-DUR) 10 MEQ tablet Take 1 tablet (10 mEq total) by mouth daily.  30 tablet  11   No current facility-administered medications for this visit.    Past Medical History  Diagnosis Date  . Meningitis   . Hypertension     Past Surgical History  Procedure Laterality Date  . Abdominal hysterectomy    . Thyroid surgery  2002    WUJ:WJXBJY of systems complete and found to be negative unless listed above  PHYSICAL EXAM BP 117/80  Pulse 67  Ht 5\' 8"  (1.727 m)  Wt 204 lb 1.9 oz (92.588 kg)  BMI 31.04 kg/m2  General: Well developed, well nourished, in no acute distress Head: Eyes PERRLA, No xanthomas.   Normal cephalic and atramatic  Lungs: Clear bilaterally to auscultation and percussion. Heart: HRRR S1 S2, without MRG.  Pulses are 2+ & equal.            No carotid bruit. No JVD.  No abdominal bruits. No femoral bruits. Abdomen: Bowel sounds are positive, abdomen soft and non-tender without masses or                  Hernia's noted. Msk:  Back normal, normal  gait. Normal strength and tone for age. Extremities: No clubbing, cyanosis or edema.  DP +1 Neuro: Alert and oriented X 3. Psych:  Good affect, responds appropriately  EKG:NSR with t-wave flattening in the lateral leads.  ASSESSMENT AND PLAN

## 2012-09-20 NOTE — Patient Instructions (Addendum)
DECREASE HCTZ TO 12.5 MG DAILY  LABS TODAY; BMET, TSH  PLEASE FOLLOW UP IN 1 MONTH

## 2012-09-20 NOTE — Assessment & Plan Note (Signed)
She is over dosing on HCTZ with 1 1/2 tablets daily instead of one tablet daily. Will recheck BMET as she is also on ACE and potassium replacement. Will decrease her HCTZ to 12.5 mg daily as directed. Will see her on follow-up to check her status. Review labs. Due to fatigue, she will have TSH completed with know history of thyroid disease in the past, but had been taken off of thyroid replacement.

## 2012-10-22 ENCOUNTER — Ambulatory Visit: Payer: Medicaid Other | Admitting: Adult Health

## 2012-11-12 ENCOUNTER — Encounter: Payer: Self-pay | Admitting: Adult Health

## 2012-11-12 ENCOUNTER — Ambulatory Visit (INDEPENDENT_AMBULATORY_CARE_PROVIDER_SITE_OTHER): Payer: Medicaid Other | Admitting: Adult Health

## 2012-11-12 VITALS — BP 110/70 | HR 64 | Ht 69.0 in | Wt 210.0 lb

## 2012-11-12 DIAGNOSIS — E876 Hypokalemia: Secondary | ICD-10-CM

## 2012-11-12 DIAGNOSIS — I10 Essential (primary) hypertension: Secondary | ICD-10-CM

## 2012-11-12 MED ORDER — HYDROCHLOROTHIAZIDE 25 MG PO TABS: 25.0000 mg | ORAL_TABLET | Freq: Every day | ORAL | Status: DC

## 2012-11-12 NOTE — Assessment & Plan Note (Signed)
Recent labs demonstrate a potassium of 4.6.

## 2012-11-12 NOTE — Patient Instructions (Addendum)

## 2012-11-12 NOTE — Progress Notes (Deleted)
Name: Hannah Cooper    DOB: 10-26-1955  Age: 57 y.o.  MR#: [id number]       PCP:  Evlyn Courier, MD      Insurance: Payor: MEDICAID [place] / Plan: MEDICAID [place] ACCESS / Product Type: *No Product type* /   CC:   No chief complaint on file.   VS Filed Vitals:   11/12/12 1414  BP: 110/70  Pulse: 64  Height: 5\' 9"  (1.753 m)  Weight: 210 lb (95.255 kg)    Weights Current Weight  11/12/12 210 lb (95.255 kg)  09/20/12 204 lb 1.9 oz (92.588 kg)  09/06/12 216 lb 7.9 oz (98.2 kg)    Blood Pressure  BP Readings from Last 3 Encounters:  11/12/12 110/70  09/20/12 117/80  09/06/12 149/75     Admit date:  (Not on file) Last encounter with RMR:  10/22/2012   Allergy Review of patient's allergies indicates no known allergies.  Current Outpatient Prescriptions  Medication Sig Dispense Refill  . aspirin 325 MG tablet Take 325-650 mg by mouth once as needed for pain.      . hydrochlorothiazide (HYDRODIURIL) 25 MG tablet Take 25 mg by mouth daily.      Marland Kitchen lisinopril (PRINIVIL,ZESTRIL) 10 MG tablet Take 1 tablet (10 mg total) by mouth daily.  30 tablet  11  . potassium chloride (K-DUR) 10 MEQ tablet Take 1 tablet (10 mEq total) by mouth daily.  30 tablet  11   No current facility-administered medications for this visit.    Discontinued Meds:    Medications Discontinued During This Encounter  Medication Reason  . hydrochlorothiazide (HYDRODIURIL) 25 MG tablet     Patient Active Problem List   Diagnosis Date Noted  . Hypokalemia 09/04/2012  . Precordial pain 09/04/2012  . Viral meningitis 01/22/2011  . Vomiting 01/22/2011  . PNEUMONIA, LEFT LOWER LOBE 10/10/2006  . DISEASE, ACUTE BRONCHOSPASM 10/10/2006  . HYPOTHYROIDISM 05/23/2006  . HYPERTENSION 05/23/2006  . OSTEOARTHRITIS 05/23/2006    LABS    Component Value Date/Time   NA 139 09/20/2012 1412   NA 139 09/06/2012 0545   NA 140 09/05/2012 0222   K 3.8 09/20/2012 1412   K 3.9 09/06/2012 0545   K 3.7 09/05/2012 0222   CL  104 09/20/2012 1412   CL 105 09/06/2012 0545   CL 105 09/05/2012 0222   CO2 28 09/20/2012 1412   CO2 23 09/06/2012 0545   CO2 30 09/05/2012 0222   GLUCOSE 93 09/20/2012 1412   GLUCOSE 96 09/06/2012 0545   GLUCOSE 119* 09/05/2012 0222   BUN 14 09/20/2012 1412   BUN 12 09/06/2012 0545   BUN 15 09/05/2012 0222   CREATININE 0.81 09/20/2012 1412   CREATININE 0.67 09/06/2012 0545   CREATININE 0.71 09/05/2012 0222   CREATININE 0.77 09/04/2012 1634   CALCIUM 9.3 09/20/2012 1412   CALCIUM 8.8 09/06/2012 0545   CALCIUM 8.7 09/05/2012 0222   GFRNONAA >90 09/06/2012 0545   GFRNONAA >90 09/05/2012 0222   GFRNONAA >90 09/04/2012 1634   GFRAA >90 09/06/2012 0545   GFRAA >90 09/05/2012 0222   GFRAA >90 09/04/2012 1634   CMP     Component Value Date/Time   NA 139 09/20/2012 1412   K 3.8 09/20/2012 1412   CL 104 09/20/2012 1412   CO2 28 09/20/2012 1412   GLUCOSE 93 09/20/2012 1412   BUN 14 09/20/2012 1412   CREATININE 0.81 09/20/2012 1412   CREATININE 0.67 09/06/2012 0545   CALCIUM 9.3 09/20/2012 1412  PROT 8.5* 09/04/2012 1634   ALBUMIN 4.1 09/04/2012 1634   AST 23 09/04/2012 1634   ALT 30 09/04/2012 1634   ALKPHOS 73 09/04/2012 1634   BILITOT 0.2* 09/04/2012 1634   GFRNONAA >90 09/06/2012 0545   GFRAA >90 09/06/2012 0545       Component Value Date/Time   WBC 3.3* 09/05/2012 0222   WBC 3.7* 09/04/2012 1634   WBC 3.1* 04/16/2012 1045   HGB 11.0* 09/05/2012 0222   HGB 12.4 09/04/2012 1634   HGB 12.1 04/16/2012 1045   HCT 32.4* 09/05/2012 0222   HCT 36.2 09/04/2012 1634   HCT 35.8* 04/16/2012 1045   MCV 88.0 09/05/2012 0222   MCV 88.1 09/04/2012 1634   MCV 88.0 04/16/2012 1045    Lipid Panel     Component Value Date/Time   CHOL 137 09/05/2012 0226   TRIG 142 09/05/2012 0226   HDL 41 09/05/2012 0226   CHOLHDL 3.3 09/05/2012 0226   VLDL 28 09/05/2012 0226   LDLCALC 68 09/05/2012 0226    ABG No results found for this basename: phart, pco2, pco2art, po2, po2art, hco3, tco2, acidbasedef, o2sat     Lab Results  Component Value Date    TSH 2.973 09/20/2012   BNP (last 3 results) No results found for this basename: PROBNP,  in the last 8760 hours Cardiac Panel (last 3 results) No results found for this basename: CKTOTAL, CKMB, TROPONINI, RELINDX,  in the last 72 hours  Iron/TIBC/Ferritin No results found for this basename: iron, tibc, ferritin     EKG Orders placed in visit on 09/20/12  . EKG 12-LEAD     Prior Assessment and Plan Problem List as of 11/12/2012     Cardiovascular and Mediastinum   HYPERTENSION   Last Assessment & Plan   09/20/2012 Office Visit Written 09/20/2012  2:15 PM by Jodelle Gross, NP     She is over dosing on HCTZ with 1 1/2 tablets daily instead of one tablet daily. Will recheck BMET as she is also on ACE and potassium replacement. Will decrease her HCTZ to 12.5 mg daily as directed. Will see her on follow-up to check her status. Review labs. Due to fatigue, she will have TSH completed with know history of thyroid disease in the past, but had been taken off of thyroid replacement.      Respiratory   PNEUMONIA, LEFT LOWER LOBE   DISEASE, ACUTE BRONCHOSPASM     Endocrine   HYPOTHYROIDISM   Last Assessment & Plan   09/20/2012 Office Visit Written 09/20/2012  2:15 PM by Jodelle Gross, NP     Recheck THS      Nervous and Auditory   Viral meningitis     Musculoskeletal and Integument   OSTEOARTHRITIS     Other   Vomiting   Hypokalemia   Precordial pain   Last Assessment & Plan   09/20/2012 Office Visit Written 09/20/2012  2:16 PM by Jodelle Gross, NP     Resolved.        Imaging: No results found.

## 2012-11-12 NOTE — Assessment & Plan Note (Signed)
Excellent control of blood pressure on current medication regimen. We will not change anything at this time. I have given her refill on HCTZ. Labs have been reviewed and are found be within normal limits. We will see her again in 6 months unless she becomes symptomatic

## 2012-11-12 NOTE — Progress Notes (Signed)
   HPI: [name] is a 57 year old patient of Dr. Dietrich Pates we are following for ongoing assessment and management of chest pain, status post cardiac catheterization in May 2014, demonstrating normal coronary arteries. The patient was found to have diastolic CHF and placed on a low-dose diuretic HCTZ 25 mg daily and lisinopril 10 mg daily. On last visit she was overdosing HCTZ and taking 1-1/2 tablets. She was reduced to one 25 mg tablet daily with followup labs. Labs demonstrated a sodium of 139 a potassium of 3.8 chloride 10 BUN of 14 with a creatinine of 0.81. Since decreasing the dose the patient has felt well, denies any complaints of chest pain or shortness of breath. She is complaining of some low back pain but admits to jumping rope at a church vacation [employer] party, and feels it was related to this as she has not jumped rope in years and was trying to play with the children. Is seeing her primary care physician for this if back pain continues.  No Known Allergies  Current Outpatient Prescriptions  Medication Sig Dispense Refill  . aspirin 325 MG tablet Take 325-650 mg by mouth once as needed for pain.      . hydrochlorothiazide (HYDRODIURIL) 25 MG tablet Take 1 tablet (25 mg total) by mouth daily.  30 tablet  6  . lisinopril (PRINIVIL,ZESTRIL) 10 MG tablet Take 1 tablet (10 mg total) by mouth daily.  30 tablet  11  . potassium chloride (K-DUR) 10 MEQ tablet Take 1 tablet (10 mEq total) by mouth daily.  30 tablet  11   No current facility-administered medications for this visit.    Past Medical History  Diagnosis Date  . Meningitis   . Hypertension     Past Surgical History  Procedure Laterality Date  . Abdominal hysterectomy    . Thyroid surgery  2002    ZOX:WRUEAV of systems complete and found to be negative unless listed above  PHYSICAL EXAM BP 110/70  Pulse 64  Ht 5\' 9"  (1.753 m)  Wt 210 lb (95.255 kg)  BMI 31 kg/m2  General: Well developed, well nourished,  obese, in no acute distress Head: Eyes PERRLA, No xanthomas.   Normal cephalic and atramatic  Lungs: Clear bilaterally to auscultation and percussion. Heart: HRRR S1 S2, without MRG.  Pulses are 2+ & equal.            No carotid bruit. No JVD.  No abdominal bruits. No femoral bruits. Abdomen: Bowel sounds are positive, abdomen soft and non-tender without masses or                  Hernia's noted. Msk:  Back normal, normal gait. Normal strength and tone for age. Extremities: No clubbing, cyanosis or edema.  DP +1 Neuro: Alert and oriented X 3. Psych:  Good affect, responds appropriately    ASSESSMENT AND PLAN

## 2013-01-10 ENCOUNTER — Emergency Department (HOSPITAL_COMMUNITY): Payer: Medicaid Other

## 2013-01-10 ENCOUNTER — Encounter (HOSPITAL_COMMUNITY): Payer: Self-pay

## 2013-01-10 ENCOUNTER — Emergency Department (HOSPITAL_COMMUNITY)
Admission: EM | Admit: 2013-01-10 | Discharge: 2013-01-10 | Disposition: A | Discharge status: Home / Self Care | Payer: Medicaid Other | Attending: Emergency Medicine | Admitting: Emergency Medicine

## 2013-01-10 DIAGNOSIS — J069 Acute upper respiratory infection, unspecified: Secondary | ICD-10-CM | POA: Insufficient documentation

## 2013-01-10 DIAGNOSIS — I10 Essential (primary) hypertension: Secondary | ICD-10-CM | POA: Insufficient documentation

## 2013-01-10 DIAGNOSIS — J029 Acute pharyngitis, unspecified: Secondary | ICD-10-CM | POA: Insufficient documentation

## 2013-01-10 DIAGNOSIS — Z79899 Other long term (current) drug therapy: Secondary | ICD-10-CM | POA: Insufficient documentation

## 2013-01-10 DIAGNOSIS — R0602 Shortness of breath: Secondary | ICD-10-CM | POA: Insufficient documentation

## 2013-01-10 DIAGNOSIS — J209 Acute bronchitis, unspecified: Secondary | ICD-10-CM | POA: Insufficient documentation

## 2013-01-10 DIAGNOSIS — Z8661 Personal history of infections of the central nervous system: Secondary | ICD-10-CM | POA: Insufficient documentation

## 2013-01-10 MED ORDER — PROMETHAZINE-CODEINE 6.25-10 MG/5ML PO SYRP: 5.0000 mL | ORAL_SOLUTION | ORAL | Status: DC | PRN

## 2013-01-10 MED ORDER — ALBUTEROL SULFATE HFA 108 (90 BASE) MCG/ACT IN AERS
2.0000 | INHALATION_SPRAY | Freq: Once | RESPIRATORY_TRACT | Status: AC
  Administered 2013-01-10: 2 via RESPIRATORY_TRACT
  Filled 2013-01-10: qty 6.7

## 2013-01-10 MED ORDER — BENZONATATE 100 MG PO CAPS
200.0000 mg | ORAL_CAPSULE | Freq: Once | ORAL | Status: AC
  Administered 2013-01-10: 200 mg via ORAL
  Filled 2013-01-10: qty 2

## 2013-01-10 MED ORDER — BENZONATATE 100 MG PO CAPS: 200.0000 mg | ORAL_CAPSULE | Freq: Three times a day (TID) | ORAL | Status: DC | PRN

## 2013-01-10 MED ORDER — ALBUTEROL SULFATE (5 MG/ML) 0.5% IN NEBU
5.0000 mg | INHALATION_SOLUTION | Freq: Once | RESPIRATORY_TRACT | Status: AC
  Administered 2013-01-10: 5 mg via RESPIRATORY_TRACT
  Filled 2013-01-10: qty 1

## 2013-01-10 MED ORDER — AZITHROMYCIN 250 MG PO TABS: ORAL_TABLET | ORAL | Status: DC

## 2013-01-10 NOTE — ED Provider Notes (Signed)
CSN: [id number]     Arrival date & time 01/10/13  1153 History   First MD Initiated Contact with Patient 01/10/13 1239     Chief Complaint  Patient presents with  . URI   (Consider location/radiation/quality/duration/timing/severity/associated sxs/prior Treatment) HPI Comments: Hannah Cooper is a 57 y.o. Female presenting with a 10 day history of  uri type symptoms which included nasal congestion with clear rhinorrhea, sore throat, and a fever to 102 (last fever was 3 days ago) along with a persistent and nearly constant nonproductive cough with intermittent wheezing and shortness of breath episodes and generalized myalgias.  Symptoms due to not include  chest pain,  Nausea, vomiting or diarrhea. She reports her throat and chest are sore from coughing.  The patient has taken both dayquill and nyquill prior to arrival with no significant improvement in symptoms.  She denies cigarette use.      The history is provided by the patient.    Past Medical History  Diagnosis Date  . Meningitis   . Hypertension    Past Surgical History  Procedure Laterality Date  . Abdominal hysterectomy    . Thyroid surgery  2002   Family History  Problem Relation Age of Onset  . Heart attack Mother 54  . Heart attack Sister 48   History  Substance Use Topics  . Smoking status: Never Smoker   . Smokeless tobacco: Never Used  . Alcohol Use: No   OB History   Grav Para Term Preterm Abortions TAB SAB Ect Mult Living                 Review of Systems  Constitutional: Negative for fever.  HENT: Positive for sore throat and rhinorrhea. Negative for congestion and neck pain.   Eyes: Negative.   Respiratory: Negative for chest tightness and shortness of breath.   Cardiovascular: Negative for chest pain.  Gastrointestinal: Negative for nausea and abdominal pain.  Genitourinary: Negative.   Musculoskeletal: Negative for joint swelling and arthralgias.  Skin: Negative.  Negative for rash and  wound.  Neurological: Negative for dizziness, weakness, light-headedness, numbness and headaches.  Psychiatric/Behavioral: Negative.     Allergies  Review of patient's allergies indicates no known allergies.  Home Medications   Current Outpatient Rx  Name  Route  Sig  Dispense  Refill  . hydrochlorothiazide (HYDRODIURIL) 25 MG tablet   Oral   Take 1 tablet (25 mg total) by mouth daily.   30 tablet   6   . lisinopril (PRINIVIL,ZESTRIL) 10 MG tablet   Oral   Take 1 tablet (10 mg total) by mouth daily.   30 tablet   11   . potassium chloride (K-DUR) 10 MEQ tablet   Oral   Take 1 tablet (10 mEq total) by mouth daily.   30 tablet   11   . azithromycin (ZITHROMAX Z-PAK) 250 MG tablet      Take 2 tablets by mouth on day one followed by one tablet daily for 4 days.   6 tablet   0   . benzonatate (TESSALON) 100 MG capsule   Oral   Take 2 capsules (200 mg total) by mouth 3 (three) times daily as needed for cough.   30 capsule   0   . promethazine-codeine (PHENERGAN WITH CODEINE) 6.25-10 MG/5ML syrup   Oral   Take 5 mLs by mouth every 4 (four) hours as needed for cough.   120 mL   0    BP 142/86  Pulse 72  Temp(Src) 98.3 F (36.8 C) (Oral)  Resp 20  SpO2 100% Physical Exam  Constitutional: She is oriented to person, place, and time. She appears well-developed and well-nourished. No distress.  HENT:  Head: Normocephalic and atraumatic.  Right Ear: Tympanic membrane and ear canal normal.  Left Ear: Tympanic membrane and ear canal normal.  Nose: Mucosal edema and rhinorrhea present.  Mouth/Throat: Uvula is midline, oropharynx is clear and moist and mucous membranes are normal. No oropharyngeal exudate, posterior oropharyngeal edema, posterior oropharyngeal erythema or tonsillar abscesses.  Eyes: Conjunctivae are normal.  Cardiovascular: Normal rate and normal heart sounds.   Pulmonary/Chest: Effort normal. No respiratory distress. She has decreased breath sounds in  the left upper field, the left middle field and the left lower field. She has wheezes in the left upper field. She has no rales.  Trace intermittent wheeze left anterior chest.  Decreased breath sounds left lung.  Abdominal: Soft. There is no tenderness.  Musculoskeletal: Normal range of motion.  Neurological: She is alert and oriented to person, place, and time.  Skin: Skin is warm and dry. No rash noted.  Psychiatric: She has a normal mood and affect.    ED Course  Procedures (including critical care time) Labs Review Labs Reviewed - No data to display Imaging Review Dg Chest 2 View  01/10/2013   CLINICAL DATA:  Cough, shortness of breath  EXAM: CHEST  2 VIEW  COMPARISON:  09/04/2012  FINDINGS: The heart size and mediastinal contours are within normal limits. Both lungs are clear. The visualized skeletal structures are stable.  IMPRESSION: No active cardiopulmonary disease.  No significant change.   Electronically Signed   By: Natasha Mead   On: 01/10/2013 14:15    MDM   1. Bronchitis with bronchospasm    Pt was given albuterol neb with improved aeration, no wheezing at recheck.  Tessalon perles given,  Prescription for same,  She was treated with z pack given lingering sx. Encouraged rest,  Increased fluids.  Recheck with pcp if not improving over the weekend.  Patients labs and/or radiological studies were viewed and considered during the medical decision making and disposition process.     Burgess Amor, PA-C 01/10/13 1453

## 2013-01-10 NOTE — ED Notes (Signed)
Pt c/o fever, cough, bodyaches for the past week.  Has been taking OTC cold medications.

## 2013-01-10 NOTE — ED Notes (Signed)
Sore throat, cough, nausea, no vomiting,  Nasal congestion.

## 2013-01-10 NOTE — ED Notes (Signed)
HHN in progress 

## 2013-01-11 NOTE — ED Provider Notes (Signed)
Medical screening examination/treatment/procedure(s) were performed by non-physician practitioner and as supervising physician I was immediately available for consultation/collaboration.  Donnetta Hutching, MD 01/11/13 1535

## 2013-03-10 ENCOUNTER — Emergency Department (HOSPITAL_COMMUNITY)
Admission: EM | Admit: 2013-03-10 | Discharge: 2013-03-10 | Disposition: A | Discharge status: Home / Self Care | Payer: Medicaid Other | Attending: Emergency Medicine | Admitting: Emergency Medicine

## 2013-03-10 ENCOUNTER — Encounter (HOSPITAL_COMMUNITY): Payer: Self-pay | Admitting: Emergency Medicine

## 2013-03-10 DIAGNOSIS — Z792 Long term (current) use of antibiotics: Secondary | ICD-10-CM | POA: Insufficient documentation

## 2013-03-10 DIAGNOSIS — B029 Zoster without complications: Secondary | ICD-10-CM | POA: Insufficient documentation

## 2013-03-10 DIAGNOSIS — IMO0001 Reserved for inherently not codable concepts without codable children: Secondary | ICD-10-CM | POA: Insufficient documentation

## 2013-03-10 DIAGNOSIS — Z79899 Other long term (current) drug therapy: Secondary | ICD-10-CM | POA: Insufficient documentation

## 2013-03-10 DIAGNOSIS — Z8669 Personal history of other diseases of the nervous system and sense organs: Secondary | ICD-10-CM | POA: Insufficient documentation

## 2013-03-10 DIAGNOSIS — I10 Essential (primary) hypertension: Secondary | ICD-10-CM | POA: Insufficient documentation

## 2013-03-10 MED ORDER — OXYCODONE-ACETAMINOPHEN 5-325 MG PO TABS
1.0000 | ORAL_TABLET | Freq: Once | ORAL | Status: AC
  Administered 2013-03-10: 1 via ORAL
  Filled 2013-03-10: qty 1

## 2013-03-10 MED ORDER — OXYCODONE-ACETAMINOPHEN 5-325 MG PO TABS: 1.0000 | ORAL_TABLET | Freq: Four times a day (QID) | ORAL | Status: DC | PRN

## 2013-03-10 MED ORDER — IBUPROFEN 800 MG PO TABS
800.0000 mg | ORAL_TABLET | Freq: Once | ORAL | Status: AC
  Administered 2013-03-10: 800 mg via ORAL
  Filled 2013-03-10: qty 1

## 2013-03-10 MED ORDER — IBUPROFEN 800 MG PO TABS: 800.0000 mg | ORAL_TABLET | Freq: Three times a day (TID) | ORAL | Status: DC

## 2013-03-10 MED ORDER — FAMCICLOVIR 500 MG PO TABS: 500.0000 mg | ORAL_TABLET | Freq: Two times a day (BID) | ORAL | Status: DC

## 2013-03-10 MED ORDER — ONDANSETRON HCL 4 MG PO TABS
4.0000 mg | ORAL_TABLET | Freq: Once | ORAL | Status: AC
  Administered 2013-03-10: 4 mg via ORAL
  Filled 2013-03-10: qty 1

## 2013-03-10 NOTE — ED Provider Notes (Signed)
Medical screening examination/treatment/procedure(s) were performed by non-physician practitioner and as supervising physician I was immediately available for consultation/collaboration.  EKG Interpretation   None         Joya Gaskins, MD 03/10/13 2206

## 2013-03-10 NOTE — ED Notes (Signed)
Pt with prior hx of shingles, states she has an outbreak on the L side of her abd.

## 2013-03-10 NOTE — ED Provider Notes (Signed)
CSN: [id number]     Arrival date & time 03/10/13  1738 History   First MD Initiated Contact with Patient 03/10/13 1827     Chief Complaint  Patient presents with  . Herpes Zoster   (Consider location/radiation/quality/duration/timing/severity/associated sxs/prior Treatment) HPI Comments: Pt is a 57 y/o female who presents to ED with rash on the  Left side of the abd. Pt states this is painful and she thinks she may have shingles.  Patient is a 57 y.o. female presenting with rash. The history is provided by the patient.  Rash Location:  Torso Torso rash location:  Abd LLQ Quality: blistering, painful and redness   Quality: not weeping   Pain details:    Quality:  Aching and sore   Severity:  Severe   Onset quality:  Gradual   Duration:  3 days   Timing:  Constant   Progression:  Worsening Onset quality:  Gradual Duration:  3 days Timing:  Constant Progression:  Worsening Chronicity:  Recurrent Context comment:  Pt reports hx of chicken pox during childhood. had  a zoster out break a few years ago and feels this is what is happening now. Relieved by:  Nothing Exacerbated by: palpation. Ineffective treatments:  None tried Associated symptoms: myalgias   Associated symptoms: no abdominal pain, no fatigue, no fever, no headaches, no joint pain, no nausea, no shortness of breath, no throat swelling, no tongue swelling, not vomiting and not wheezing     Past Medical History  Diagnosis Date  . Meningitis   . Hypertension    Past Surgical History  Procedure Laterality Date  . Abdominal hysterectomy    . Thyroid surgery  2002   Family History  Problem Relation Age of Onset  . Heart attack Mother 83  . Heart attack Sister 5   History  Substance Use Topics  . Smoking status: Never Smoker   . Smokeless tobacco: Never Used  . Alcohol Use: No   OB History   Grav Para Term Preterm Abortions TAB SAB Ect Mult Living                 Review of Systems  Constitutional:  Negative for fever, activity change and fatigue.       All ROS Neg except as noted in HPI  HENT: Negative for nosebleeds.   Eyes: Negative for photophobia and discharge.  Respiratory: Negative for cough, shortness of breath and wheezing.   Cardiovascular: Negative for chest pain and palpitations.  Gastrointestinal: Negative for nausea, vomiting, abdominal pain and blood in stool.  Genitourinary: Negative for dysuria, frequency and hematuria.  Musculoskeletal: Positive for myalgias. Negative for arthralgias, back pain and neck pain.  Skin: Positive for rash.  Neurological: Negative for dizziness, seizures, speech difficulty and headaches.  Psychiatric/Behavioral: Negative for hallucinations and confusion.    Allergies  Review of patient's allergies indicates no known allergies.  Home Medications   Current Outpatient Rx  Name  Route  Sig  Dispense  Refill  . azithromycin (ZITHROMAX Z-PAK) 250 MG tablet      Take 2 tablets by mouth on day one followed by one tablet daily for 4 days.   6 tablet   0   . benzonatate (TESSALON) 100 MG capsule   Oral   Take 2 capsules (200 mg total) by mouth 3 (three) times daily as needed for cough.   30 capsule   0   . hydrochlorothiazide (HYDRODIURIL) 25 MG tablet   Oral   Take 1 tablet (  25 mg total) by mouth daily.   30 tablet   6   . lisinopril (PRINIVIL,ZESTRIL) 10 MG tablet   Oral   Take 1 tablet (10 mg total) by mouth daily.   30 tablet   11   . potassium chloride (K-DUR) 10 MEQ tablet   Oral   Take 1 tablet (10 mEq total) by mouth daily.   30 tablet   11   . promethazine-codeine (PHENERGAN WITH CODEINE) 6.25-10 MG/5ML syrup   Oral   Take 5 mLs by mouth every 4 (four) hours as needed for cough.   120 mL   0    BP 136/84  Pulse 89  Temp(Src) 98.9 F (37.2 C) (Oral)  Resp 20  Ht 5' 6.5" (1.689 m)  Wt 185 lb (83.915 kg)  BMI 29.42 kg/m2  SpO2 98% Physical Exam  Nursing note and vitals reviewed. Constitutional: She is  oriented to person, place, and time. She appears well-developed and well-nourished.  Non-toxic appearance.  HENT:  Head: Normocephalic.  Right Ear: Tympanic membrane and external ear normal.  Left Ear: Tympanic membrane and external ear normal.  Eyes: EOM and lids are normal. Pupils are equal, round, and reactive to light.  Neck: Normal range of motion. Neck supple. Carotid bruit is not present.  Cardiovascular: Normal rate, regular rhythm, normal heart sounds, intact distal pulses and normal pulses.   Pulmonary/Chest: Breath sounds normal. No respiratory distress.  Abdominal: Soft. Bowel sounds are normal. There is no tenderness. There is no guarding.  Musculoskeletal: Normal range of motion.  Lymphadenopathy:       Head (right side): No submandibular adenopathy present.       Head (left side): No submandibular adenopathy present.    She has no cervical adenopathy.  Neurological: She is alert and oriented to person, place, and time. She has normal strength. No cranial nerve deficit or sensory deficit.  Skin: Skin is warm and dry.  There is a red raised rash at the left lower abdomen. There is minimal redness and some tenderness noted at the left flank area. There are cluster of blisters on the red raised area.  Psychiatric: She has a normal mood and affect. Her speech is normal.    ED Course  Procedures (including critical care time) Labs Review Labs Reviewed - No data to display Imaging Review No results found.  EKG Interpretation   None       MDM  No diagnosis found. *I have reviewed nursing notes, vital signs, and all appropriate lab and imaging results for this patient.**  Patient states she had an episode of not feeling well a few days ago, shortly after this she noticed a red area that was painful to touch. This then advanced to the red area with blisters. And the pain extended from the left lower abdomen to the left flank area. Patient states this feels a lot like a  previous bout with shingles.  The examination is consistent with shingles. The plan at this time is for the patient to be treated with Famvir, Percocet, and ibuprofen. Patient is to follow up with her primary care physician in the office, she will return to the emergency department if any changes, problems, or concerns.  Kathie Dike, PA-C 03/10/13 2138

## 2013-05-28 ENCOUNTER — Emergency Department (HOSPITAL_COMMUNITY)
Admission: EM | Admit: 2013-05-28 | Discharge: 2013-05-28 | Disposition: A | Discharge status: Home / Self Care | Payer: Medicaid Other | Attending: Emergency Medicine | Admitting: Emergency Medicine

## 2013-05-28 ENCOUNTER — Encounter (HOSPITAL_COMMUNITY): Payer: Self-pay | Admitting: Emergency Medicine

## 2013-05-28 DIAGNOSIS — Z79899 Other long term (current) drug therapy: Secondary | ICD-10-CM | POA: Insufficient documentation

## 2013-05-28 DIAGNOSIS — I10 Essential (primary) hypertension: Secondary | ICD-10-CM | POA: Insufficient documentation

## 2013-05-28 DIAGNOSIS — IMO0002 Reserved for concepts with insufficient information to code with codable children: Secondary | ICD-10-CM | POA: Insufficient documentation

## 2013-05-28 DIAGNOSIS — Z791 Long term (current) use of non-steroidal anti-inflammatories (NSAID): Secondary | ICD-10-CM | POA: Insufficient documentation

## 2013-05-28 DIAGNOSIS — L02414 Cutaneous abscess of left upper limb: Secondary | ICD-10-CM

## 2013-05-28 DIAGNOSIS — Z8669 Personal history of other diseases of the nervous system and sense organs: Secondary | ICD-10-CM | POA: Insufficient documentation

## 2013-05-28 MED ORDER — ONDANSETRON HCL 4 MG PO TABS
4.0000 mg | ORAL_TABLET | Freq: Once | ORAL | Status: AC
  Administered 2013-05-28: 4 mg via ORAL
  Filled 2013-05-28: qty 1

## 2013-05-28 MED ORDER — AMOXICILLIN-POT CLAVULANATE 875-125 MG PO TABS
1.0000 | ORAL_TABLET | Freq: Once | ORAL | Status: AC
  Administered 2013-05-28: 1 via ORAL
  Filled 2013-05-28: qty 1

## 2013-05-28 MED ORDER — AMOXICILLIN 500 MG PO CAPS: 500.0000 mg | ORAL_CAPSULE | Freq: Three times a day (TID) | ORAL | Status: DC

## 2013-05-28 MED ORDER — HYDROCODONE-ACETAMINOPHEN 5-325 MG PO TABS: 1.0000 | ORAL_TABLET | ORAL | Status: DC | PRN

## 2013-05-28 MED ORDER — DOXYCYCLINE HYCLATE 100 MG PO CAPS: 100.0000 mg | ORAL_CAPSULE | Freq: Two times a day (BID) | ORAL | Status: AC

## 2013-05-28 MED ORDER — IBUPROFEN 800 MG PO TABS
800.0000 mg | ORAL_TABLET | Freq: Once | ORAL | Status: AC
  Administered 2013-05-28: 800 mg via ORAL
  Filled 2013-05-28: qty 1

## 2013-05-28 MED ORDER — DOXYCYCLINE HYCLATE 100 MG PO TABS
100.0000 mg | ORAL_TABLET | Freq: Once | ORAL | Status: AC
  Administered 2013-05-28: 100 mg via ORAL
  Filled 2013-05-28: qty 1

## 2013-05-28 MED ORDER — IBUPROFEN 800 MG PO TABS
ORAL_TABLET | ORAL | Status: AC
  Filled 2013-05-28: qty 1

## 2013-05-28 NOTE — ED Provider Notes (Signed)
Medical screening examination/treatment/procedure(s) were performed by non-physician practitioner and as supervising physician I was immediately available for consultation/collaboration.  EKG Interpretation   None         Orpah Greek, MD 05/28/13 1022

## 2013-05-28 NOTE — Discharge Instructions (Signed)
please see Dr. Arnoldo Morale for surgical evaluation of your abscess. Please use warm Epsom salt soaks to the shoulder area daily. Please use doxycycline and Amoxil daily until seen by the surgeon. Use Tylenol or ibuprofen for mild pain, use Norco for more severe pain. Abscess An abscess (boil or furuncle) is an infected area on or under the skin. This area is filled with yellowish-white fluid (pus) and other material (debris). HOME CARE   Only take medicines as told by your doctor.  If you were given antibiotic medicine, take it as directed. Finish the medicine even if you start to feel better.  If gauze is used, follow your doctor's directions for changing the gauze.  To avoid spreading the infection:  Keep your abscess covered with a bandage.  Wash your hands well.  Do not share personal care items, towels, or whirlpools with others.  Avoid skin contact with others.  Keep your skin and clothes clean around the abscess.  Keep all doctor visits as told. GET HELP RIGHT AWAY IF:   You have more pain, puffiness (swelling), or redness in the wound site.  You have more fluid or blood coming from the wound site.  You have muscle aches, chills, or you feel sick.  You have a fever. MAKE SURE YOU:   Understand these instructions.  Will watch your condition.  Will get help right away if you are not doing well or get worse. Document Released: 09/21/2007 Document Revised: 10/04/2011 Document Reviewed: 06/17/2011 [facility] Patient Information 2014 [place].

## 2013-05-28 NOTE — ED Notes (Signed)
Per patient abcsess on left shoulder. Per patient started "as a blackhead a while back"  but has recently increased in size and became painful. Patient reports using fatback with no relief.

## 2013-05-28 NOTE — ED Provider Notes (Signed)
CSN: [id number]     Arrival date & time 05/28/13  4098 History   First MD Initiated Contact with Patient 05/28/13 [phone]     Chief Complaint  Patient presents with  . Abscess     (Consider location/radiation/quality/duration/timing/severity/associated sxs/prior Treatment) Patient is a 58 y.o. female presenting with abscess. The history is provided by the patient.  Abscess Location:  Shoulder/arm Size:  2.5 Abscess quality: painful, redness and warmth   Abscess quality: not draining   Duration:  3 days Progression:  Worsening Pain details:    Quality:  Aching   Severity:  No pain   Duration:  3 days   Timing:  Constant   Progression:  Unchanged Chronicity: chronic area getting worse. Context: not diabetes and not immunosuppression   Relieved by:  Nothing Ineffective treatments:  Topical antibiotics (fatback/salt bandage) Associated symptoms: no anorexia, no fatigue, no fever and no nausea   Risk factors: prior abscess     Past Medical History  Diagnosis Date  . Meningitis   . Hypertension    Past Surgical History  Procedure Laterality Date  . Abdominal hysterectomy    . Thyroid surgery  2002   Family History  Problem Relation Age of Onset  . Heart attack Mother 52  . Heart attack Sister 1   History  Substance Use Topics  . Smoking status: Never Smoker   . Smokeless tobacco: Never Used  . Alcohol Use: No   OB History   Grav Para Term Preterm Abortions TAB SAB Ect Mult Living            0     Review of Systems  Constitutional: Negative for fever, activity change and fatigue.       All ROS Neg except as noted in HPI  HENT: Negative for nosebleeds.   Eyes: Negative for photophobia and discharge.  Respiratory: Negative for cough, shortness of breath and wheezing.   Cardiovascular: Negative for chest pain and palpitations.  Gastrointestinal: Negative for nausea, abdominal pain, blood in stool and anorexia.  Genitourinary: Negative for dysuria, frequency and  hematuria.  Musculoskeletal: Negative for arthralgias, back pain and neck pain.  Neurological: Negative for dizziness, seizures and speech difficulty.  Psychiatric/Behavioral: Negative for hallucinations and confusion.      Allergies  Review of patient's allergies indicates no known allergies.  Home Medications   Current Outpatient Rx  Name  Route  Sig  Dispense  Refill  . hydrochlorothiazide (HYDRODIURIL) 25 MG tablet   Oral   Take 1 tablet (25 mg total) by mouth daily.   30 tablet   6   . ibuprofen (ADVIL,MOTRIN) 800 MG tablet   Oral   Take 1 tablet (800 mg total) by mouth 3 (three) times daily.   21 tablet   0   . lisinopril (PRINIVIL,ZESTRIL) 10 MG tablet   Oral   Take 1 tablet (10 mg total) by mouth daily.   30 tablet   11   . potassium chloride (K-DUR) 10 MEQ tablet   Oral   Take 1 tablet (10 mEq total) by mouth daily.   30 tablet   11    BP 161/75  Pulse 66  Temp(Src) 98.1 F (36.7 C) (Oral)  Resp 18  Ht 5\' 7"  (1.702 m)  Wt 190 lb (86.183 kg)  BMI 29.75 kg/m2  SpO2 98% Physical Exam  Nursing note and vitals reviewed. Constitutional: She is oriented to person, place, and time. She appears well-developed and well-nourished.  Non-toxic appearance.  HENT:  Head: Normocephalic.  Right Ear: Tympanic membrane and external ear normal.  Left Ear: Tympanic membrane and external ear normal.  Eyes: EOM and lids are normal. Pupils are equal, round, and reactive to light.  Neck: Normal range of motion. Neck supple. Carotid bruit is not present.  Cardiovascular: Normal rate, regular rhythm, normal heart sounds, intact distal pulses and normal pulses.   Pulmonary/Chest: Breath sounds normal. No respiratory distress.  Abdominal: Soft. Bowel sounds are normal. There is no tenderness. There is no guarding.  Musculoskeletal: Normal range of motion.  Patient is a 2.5 cm red raised abscess area of the left shoulder. The area is tender to palpation. There no red streaks  appreciated. There no satellite abscess. There is no drainage present at this time.  Lymphadenopathy:       Head (right side): No submandibular adenopathy present.       Head (left side): No submandibular adenopathy present.    She has no cervical adenopathy.  Neurological: She is alert and oriented to person, place, and time. She has normal strength. No cranial nerve deficit or sensory deficit.  Skin: Skin is warm and dry.  Psychiatric: She has a normal mood and affect. Her speech is normal.    ED Course  Procedures (including critical care time) Labs Review Labs Reviewed - No data to display Imaging Review No results found.  EKG Interpretation   None       MDM   Final diagnoses:  None    *I have reviewed nursing notes, vital signs, and all appropriate lab and imaging results for this patient.**  Patient states that she has a blackhead that his been on her left shoulder for" quite a while". In the last 3-4 days she's been noticing increasing swelling redness and tenderness of the left shoulder. His been no fever or chills reported. No drainage. The patient has tried applying a fatback consult dressing. This has not been effective.  I have instructed the patient that the abscess area was a candidate for incision and drainage. The patient states that she would like to be" put to sleep" in order to have this drained. The procedure in the emergency department was explained to the patient in terms which he understood. The patient declined the in emergency room procedure, she requests to have antibiotics and she will see the surgeon on-call.  Prescription for doxycycline and Amoxil given to the patient. Prescription for Norco given to the patient. Referral to Dr. Arnoldo Morale (surgery) also given to the patient.  Lenox Ahr, PA-C 05/28/13 1015

## 2013-05-28 NOTE — ED Notes (Signed)
Pt alert & oriented x4, stable gait. Patient given discharge instructions, paperwork & prescription(s). Patient  instructed to stop at the registration desk to finish any additional paperwork. Patient verbalized understanding. Pt left department w/ no further questions. 

## 2013-06-17 ENCOUNTER — Other Ambulatory Visit (HOSPITAL_COMMUNITY)
Admission: RE | Admit: 2013-06-17 | Discharge: 2013-06-17 | Disposition: A | Discharge status: Home / Self Care | Payer: Medicaid Other | Source: Ambulatory Visit | Attending: General Surgery | Admitting: General Surgery

## 2013-06-17 DIAGNOSIS — L089 Local infection of the skin and subcutaneous tissue, unspecified: Secondary | ICD-10-CM | POA: Insufficient documentation

## 2013-07-04 ENCOUNTER — Inpatient Hospital Stay (HOSPITAL_COMMUNITY)
Admission: EM | Admit: 2013-07-04 | Discharge: 2013-07-05 | DRG: 313 | Disposition: A | Discharge status: Home / Self Care | Payer: Medicaid Other | Attending: Family Medicine | Admitting: Family Medicine

## 2013-07-04 ENCOUNTER — Emergency Department (HOSPITAL_COMMUNITY): Payer: Medicaid Other

## 2013-07-04 ENCOUNTER — Encounter (HOSPITAL_COMMUNITY): Payer: Self-pay | Admitting: Emergency Medicine

## 2013-07-04 DIAGNOSIS — R079 Chest pain, unspecified: Secondary | ICD-10-CM

## 2013-07-04 DIAGNOSIS — M7989 Other specified soft tissue disorders: Secondary | ICD-10-CM | POA: Diagnosis present

## 2013-07-04 DIAGNOSIS — R600 Localized edema: Secondary | ICD-10-CM

## 2013-07-04 DIAGNOSIS — R072 Precordial pain: Secondary | ICD-10-CM

## 2013-07-04 DIAGNOSIS — I5032 Chronic diastolic (congestive) heart failure: Secondary | ICD-10-CM

## 2013-07-04 DIAGNOSIS — E785 Hyperlipidemia, unspecified: Secondary | ICD-10-CM

## 2013-07-04 DIAGNOSIS — I509 Heart failure, unspecified: Secondary | ICD-10-CM | POA: Diagnosis present

## 2013-07-04 DIAGNOSIS — M1990 Unspecified osteoarthritis, unspecified site: Secondary | ICD-10-CM

## 2013-07-04 DIAGNOSIS — E039 Hypothyroidism, unspecified: Secondary | ICD-10-CM

## 2013-07-04 DIAGNOSIS — J9801 Acute bronchospasm: Secondary | ICD-10-CM

## 2013-07-04 DIAGNOSIS — R0789 Other chest pain: Principal | ICD-10-CM | POA: Diagnosis present

## 2013-07-04 DIAGNOSIS — I10 Essential (primary) hypertension: Secondary | ICD-10-CM

## 2013-07-04 DIAGNOSIS — E876 Hypokalemia: Secondary | ICD-10-CM

## 2013-07-04 DIAGNOSIS — Z8249 Family history of ischemic heart disease and other diseases of the circulatory system: Secondary | ICD-10-CM

## 2013-07-04 DIAGNOSIS — A879 Viral meningitis, unspecified: Secondary | ICD-10-CM

## 2013-07-04 DIAGNOSIS — M25519 Pain in unspecified shoulder: Secondary | ICD-10-CM | POA: Diagnosis present

## 2013-07-04 DIAGNOSIS — J13 Pneumonia due to Streptococcus pneumoniae: Secondary | ICD-10-CM

## 2013-07-04 DIAGNOSIS — R1110 Vomiting, unspecified: Secondary | ICD-10-CM

## 2013-07-04 HISTORY — DX: Hyperlipidemia, unspecified: E78.5

## 2013-07-04 LAB — CBC WITH DIFFERENTIAL/PLATELET
BASOS ABS: 0 10*3/uL (ref 0.0–0.1)
Basophils Relative: 0 % (ref 0–1)
EOS PCT: 3 % (ref 0–5)
Eosinophils Absolute: 0.1 10*3/uL (ref 0.0–0.7)
HCT: 33.3 % — ABNORMAL LOW (ref 36.0–46.0)
Hemoglobin: 11.2 g/dL — ABNORMAL LOW (ref 12.0–15.0)
LYMPHS PCT: 38 % (ref 12–46)
Lymphs Abs: 1.5 10*3/uL (ref 0.7–4.0)
MCH: 29.9 pg (ref 26.0–34.0)
MCHC: 33.6 g/dL (ref 30.0–36.0)
MCV: 89 fL (ref 78.0–100.0)
Monocytes Absolute: 0.3 10*3/uL (ref 0.1–1.0)
Monocytes Relative: 7 % (ref 3–12)
NEUTROS PCT: 52 % (ref 43–77)
Neutro Abs: 2 10*3/uL (ref 1.7–7.7)
PLATELETS: 250 10*3/uL (ref 150–400)
RBC: 3.74 MIL/uL — AB (ref 3.87–5.11)
RDW: 13.5 % (ref 11.5–15.5)
WBC: 3.9 10*3/uL — AB (ref 4.0–10.5)

## 2013-07-04 LAB — PRO B NATRIURETIC PEPTIDE: PRO B NATRI PEPTIDE: 19.6 pg/mL (ref 0–125)

## 2013-07-04 LAB — COMPREHENSIVE METABOLIC PANEL
ALK PHOS: 75 U/L (ref 39–117)
ALT: 29 U/L (ref 0–35)
AST: 26 U/L (ref 0–37)
Albumin: 4 g/dL (ref 3.5–5.2)
BILIRUBIN TOTAL: 0.2 mg/dL — AB (ref 0.3–1.2)
BUN: 14 mg/dL (ref 6–23)
CO2: 29 mEq/L (ref 19–32)
CREATININE: 0.78 mg/dL (ref 0.50–1.10)
Calcium: 9.3 mg/dL (ref 8.4–10.5)
Chloride: 104 mEq/L (ref 96–112)
GFR calc non Af Amer: 90 mL/min — ABNORMAL LOW (ref >90)
GLUCOSE: 92 mg/dL (ref 70–99)
POTASSIUM: 3.9 meq/L (ref 3.7–5.3)
Sodium: 142 mEq/L (ref 137–147)
Total Protein: 8.4 g/dL — ABNORMAL HIGH (ref 6.0–8.3)

## 2013-07-04 LAB — D-DIMER, QUANTITATIVE (NOT AT ARMC): D-Dimer, Quant: 1.92 ug/mL-FEU — ABNORMAL HIGH (ref 0.00–0.48)

## 2013-07-04 LAB — TROPONIN I: Troponin I: <0.3 ng/mL (ref <0.30)

## 2013-07-04 LAB — LIPASE, BLOOD: Lipase: 39 U/L (ref 11–59)

## 2013-07-04 MED ORDER — NITROGLYCERIN 0.4 MG SL SUBL
0.4000 mg | SUBLINGUAL_TABLET | SUBLINGUAL | Status: DC | PRN
  Administered 2013-07-04 (×2): 0.4 mg via SUBLINGUAL
  Filled 2013-07-04: qty 1

## 2013-07-04 MED ORDER — SODIUM CHLORIDE 0.9 % IV BOLUS (SEPSIS)
250.0000 mL | Freq: Once | INTRAVENOUS | Status: AC
Start: 2013-07-04 — End: 2013-07-04
  Administered 2013-07-04: 1000 mL via INTRAVENOUS

## 2013-07-04 MED ORDER — ONDANSETRON HCL 4 MG/2ML IJ SOLN
4.0000 mg | Freq: Once | INTRAMUSCULAR | Status: AC
  Administered 2013-07-04: 4 mg via INTRAVENOUS
  Filled 2013-07-04: qty 2

## 2013-07-04 MED ORDER — SODIUM CHLORIDE 0.9 % IJ SOLN: 3.0000 mL | INTRAMUSCULAR | Status: DC | PRN

## 2013-07-04 MED ORDER — LISINOPRIL 10 MG PO TABS
10.0000 mg | ORAL_TABLET | Freq: Every day | ORAL | Status: DC
  Administered 2013-07-05: 10 mg via ORAL
  Filled 2013-07-04: qty 1

## 2013-07-04 MED ORDER — SODIUM CHLORIDE 0.9 % IV SOLN
250.0000 mL | INTRAVENOUS | Status: DC | PRN
  Administered 2013-07-05: 250 mL via INTRAVENOUS

## 2013-07-04 MED ORDER — ENOXAPARIN SODIUM 40 MG/0.4ML ~~LOC~~ SOLN
40.0000 mg | SUBCUTANEOUS | Status: DC
  Administered 2013-07-05: 40 mg via SUBCUTANEOUS
  Filled 2013-07-04: qty 0.4

## 2013-07-04 MED ORDER — SODIUM CHLORIDE 0.9 % IJ SOLN
3.0000 mL | Freq: Two times a day (BID) | INTRAMUSCULAR | Status: DC
  Administered 2013-07-05 (×2): 3 mL via INTRAVENOUS

## 2013-07-04 MED ORDER — IOHEXOL 350 MG/ML SOLN
100.0000 mL | Freq: Once | INTRAVENOUS | Status: AC | PRN
  Administered 2013-07-04: 100 mL via INTRAVENOUS

## 2013-07-04 MED ORDER — SODIUM CHLORIDE 0.9 % IV SOLN: INTRAVENOUS | Status: DC

## 2013-07-04 MED ORDER — ATORVASTATIN CALCIUM 20 MG PO TABS
20.0000 mg | ORAL_TABLET | Freq: Every day | ORAL | Status: DC
  Administered 2013-07-05: 20 mg via ORAL
  Filled 2013-07-04: qty 1

## 2013-07-04 MED ORDER — SODIUM CHLORIDE 0.9 % IV SOLN
INTRAVENOUS | Status: DC
  Administered 2013-07-04: 20:00:00 via INTRAVENOUS

## 2013-07-04 MED ORDER — MORPHINE SULFATE 2 MG/ML IJ SOLN
2.0000 mg | Freq: Once | INTRAMUSCULAR | Status: AC
  Administered 2013-07-04: 2 mg via INTRAVENOUS
  Filled 2013-07-04: qty 1

## 2013-07-04 MED ORDER — ONDANSETRON HCL 4 MG PO TABS: 4.0000 mg | ORAL_TABLET | Freq: Four times a day (QID) | ORAL | Status: DC | PRN

## 2013-07-04 MED ORDER — MORPHINE SULFATE 2 MG/ML IJ SOLN
2.0000 mg | INTRAMUSCULAR | Status: DC | PRN
Start: 2013-07-04 — End: 2013-07-05
  Administered 2013-07-05 (×3): 2 mg via INTRAVENOUS
  Filled 2013-07-04 (×3): qty 1

## 2013-07-04 MED ORDER — ONDANSETRON HCL 4 MG/2ML IJ SOLN: 4.0000 mg | Freq: Four times a day (QID) | INTRAMUSCULAR | Status: DC | PRN

## 2013-07-04 NOTE — H&P (Signed)
PCP:   Maggie Font, MD   Chief Complaint:  Chest pain  HPI: 58 year old female who   has a past medical history of Meningitis; Hypertension; and Hyperlipidemia. today presents to the ED with chest pain, shortness of breath and diaphoresis which started around 4:15 PM today. Pain was felt like a pressure 10/10 in intensity in the midsternal region with radiation to the left side and left arm. No aggravating or relieving factors. It was associated with shortness of breath and diaphoresis but no nausea or vomiting. Patient has a history of hyperlipidemia, hypertension and has a significant family history of coronary artery disease.  ** him him patient received one sublingual nitroglycerin with no significant improvement in the pain. Patient had a previous admission for chest pain in May of 2014 at that time she was discharged home and did not have stress test or echocardiogram or cardiac cath. She also complains of cough without any phlegm. She denies any history of tobacco abuse no history of emphysema. Patient takes HCTZ for hypertension. CT angiogram was ordered which is negative for pulmonary embolism.  Allergies:  No Known Allergies    Past Medical History  Diagnosis Date  . Meningitis   . Hypertension   . Hyperlipidemia     Past Surgical History  Procedure Laterality Date  . Abdominal hysterectomy    . Thyroid surgery  2002    Prior to Admission medications   Medication Sig Start Date End Date Taking? Authorizing Provider  atorvastatin (LIPITOR) 20 MG tablet Take 20 mg by mouth at bedtime.   Yes Historical Provider, MD  hydrochlorothiazide (HYDRODIURIL) 25 MG tablet Take 1 tablet (25 mg total) by mouth daily. 11/12/12  Yes Lendon Colonel, NP  lisinopril (PRINIVIL,ZESTRIL) 10 MG tablet Take 1 tablet (10 mg total) by mouth daily. 09/06/12  Yes Rhonda G Barrett, PA-C  potassium chloride (K-DUR) 10 MEQ tablet Take 1 tablet (10 mEq total) by mouth daily. 09/06/12  Yes Rhonda G  Barrett, PA-C    Social History:  reports that she has never smoked. She has never used smokeless tobacco. She reports that she does not drink alcohol or use illicit drugs.  Family History  Problem Relation Age of Onset  . Heart attack Mother 45  . Heart attack Sister 60     All the positives are listed in BOLD  Review of Systems:  HEENT: Headache, blurred vision, runny nose, sore throat Neck: Hypothyroidism, hyperthyroidism,,lymphadenopathy Chest : Shortness of breath, history of COPD, Asthma Heart : Chest pain, history of coronary arterey disease GI:  Nausea, vomiting, diarrhea, constipation, GERD GU: Dysuria, urgency, frequency of urination, hematuria Neuro: Stroke, seizures, syncope Psych: Depression, anxiety, hallucinations   Physical Exam: Blood pressure 102/58, pulse 52, temperature 98.4 F (36.9 C), temperature source Oral, resp. rate 21, height 5' 9"  (1.753 m), weight 90.719 kg (200 lb), SpO2 100.00%. Constitutional:   Patient is a well-developed and well-nourished female* in no acute distress and cooperative with exam. Head: Normocephalic and atraumatic Mouth: Mucus membranes moist Eyes: PERRL, EOMI, conjunctivae normal Neck: Supple, No Thyromegaly Cardiovascular: RRR, S1 normal, S2 normal Pulmonary/Chest: CTAB, no wheezes, rales, or rhonchi Abdominal: Soft. Non-tender, non-distended, bowel sounds are normal, no masses, organomegaly, or guarding present.  Neurological: A&O x3, Strenght is normal and symmetric bilaterally, cranial nerve II-XII are grossly intact, no focal motor deficit, sensory intact to light touch bilaterally.  Extremities : Slight edema, positive tenderness to palpation of left calf, no erythema on left leg    Labs  on Admission:  Results for orders placed during the hospital encounter of 07/04/13 (from the past 48 hour(s))  TROPONIN I     Status: None   Collection Time    07/04/13  7:48 PM      Result Value Ref Range   Troponin I <0.30   <0.30 ng/mL   Comment:            Due to the release kinetics of cTnI,     a negative result within the first hours     of the onset of symptoms does not rule out     myocardial infarction with certainty.     If myocardial infarction is still suspected,     repeat the test at appropriate intervals.  COMPREHENSIVE METABOLIC PANEL     Status: Abnormal   Collection Time    07/04/13  7:48 PM      Result Value Ref Range   Sodium 142  137 - 147 mEq/L   Potassium 3.9  3.7 - 5.3 mEq/L   Chloride 104  96 - 112 mEq/L   CO2 29  19 - 32 mEq/L   Glucose, Bld 92  70 - 99 mg/dL   BUN 14  6 - 23 mg/dL   Creatinine, Ser 0.78  0.50 - 1.10 mg/dL   Calcium 9.3  8.4 - 10.5 mg/dL   Total Protein 8.4 (*) 6.0 - 8.3 g/dL   Albumin 4.0  3.5 - 5.2 g/dL   AST 26  0 - 37 U/L   ALT 29  0 - 35 U/L   Alkaline Phosphatase 75  39 - 117 U/L   Total Bilirubin 0.2 (*) 0.3 - 1.2 mg/dL   GFR calc non Af Amer 90 (*) >90 mL/min   GFR calc Af Amer >90  >90 mL/min   Comment: (NOTE)     The eGFR has been calculated using the CKD EPI equation.     This calculation has not been validated in all clinical situations.     eGFR's persistently <90 mL/min signify possible Chronic Kidney     Disease.  CBC WITH DIFFERENTIAL     Status: Abnormal   Collection Time    07/04/13  7:48 PM      Result Value Ref Range   WBC 3.9 (*) 4.0 - 10.5 K/uL   RBC 3.74 (*) 3.87 - 5.11 MIL/uL   Hemoglobin 11.2 (*) 12.0 - 15.0 g/dL   HCT 33.3 (*) 36.0 - 46.0 %   MCV 89.0  78.0 - 100.0 fL   MCH 29.9  26.0 - 34.0 pg   MCHC 33.6  30.0 - 36.0 g/dL   RDW 13.5  11.5 - 15.5 %   Platelets 250  150 - 400 K/uL   Neutrophils Relative % 52  43 - 77 %   Neutro Abs 2.0  1.7 - 7.7 K/uL   Lymphocytes Relative 38  12 - 46 %   Lymphs Abs 1.5  0.7 - 4.0 K/uL   Monocytes Relative 7  3 - 12 %   Monocytes Absolute 0.3  0.1 - 1.0 K/uL   Eosinophils Relative 3  0 - 5 %   Eosinophils Absolute 0.1  0.0 - 0.7 K/uL   Basophils Relative 0  0 - 1 %   Basophils  Absolute 0.0  0.0 - 0.1 K/uL  LIPASE, BLOOD     Status: None   Collection Time    07/04/13  7:48 PM      Result Value  Ref Range   Lipase 39  11 - 59 U/L  D-DIMER, QUANTITATIVE     Status: Abnormal   Collection Time    07/04/13  7:48 PM      Result Value Ref Range   D-Dimer, Quant 1.92 (*) 0.00 - 0.48 ug/mL-FEU   Comment:            AT THE INHOUSE ESTABLISHED CUTOFF     VALUE OF 0.48 ug/mL FEU,     THIS ASSAY HAS BEEN DOCUMENTED     IN THE LITERATURE TO HAVE     A SENSITIVITY AND NEGATIVE     PREDICTIVE VALUE OF AT LEAST     98 TO 99%.  THE TEST RESULT     SHOULD BE CORRELATED WITH     AN ASSESSMENT OF THE CLINICAL     PROBABILITY OF DVT / VTE.  PRO B NATRIURETIC PEPTIDE     Status: None   Collection Time    07/04/13  7:48 PM      Result Value Ref Range   Pro B Natriuretic peptide (BNP) 19.6  0 - 125 pg/mL    Radiological Exams on Admission: Dg Chest 2 View  07/04/2013   CLINICAL DATA:  Chest pain.  EXAM: CHEST  2 VIEW  COMPARISON:  01/10/2013.  FINDINGS: The heart size and mediastinal contours are within normal limits. Both lungs are clear except for minimal right basilar atelectasis. The visualized skeletal structures are unremarkable.  IMPRESSION: No active cardiopulmonary disease.   Electronically Signed   By: Kalman Jewels M.D.   On: 07/04/2013 19:50   Ct Angio Chest Pe W/cm &/or Wo Cm  07/04/2013   CLINICAL DATA:  Chest pain for 10 hr.  EXAM: CT ANGIOGRAPHY CHEST WITH CONTRAST  TECHNIQUE: Multidetector CT imaging of the chest was performed using the standard protocol during bolus administration of intravenous contrast. Multiplanar CT image reconstructions and MIPs were obtained to evaluate the vascular anatomy.  CONTRAST:  100 mL OMNIPAQUE IOHEXOL 350 MG/ML SOLN  COMPARISON:  PA and lateral chest 07/04/2013 at 1926 hr  FINDINGS: No pulmonary embolus is identified. Heart size is normal. No pleural or pericardial effusion. No axillary, hilar or mediastinal lymphadenopathy. Mild  dependent atelectasis is noted. Lungs otherwise clear. Imaged upper abdomen unremarkable. No focal bony abnormality.  Review of the MIP images confirms the above findings.  IMPRESSION: Negative for pulmonary embolus.  Negative exam.   Electronically Signed   By: Inge Rise M.D.   On: 07/04/2013 23:06    Assessment/Plan Active Problems:   HYPERTENSION   Chest pain  Chest pain We'll admit the patient under telemetry, cycle the cardiac enzymes. CT angiogram negative for PE, first set of troponin is negative in the ED. We'll also order 2-D echocardiogram in a.m. and cardiology consultation for possible cardiac stress testing. EKG shows nonspecific ST-T changes.  Hypertension Continue lisinopril, will hold HCTZ at this time  Hyperlipidemia Continue Lipitor  DVT prophylaxis Lovenox   Code status: full code   Family discussion: discussed with patient's husband and son at bedside    Time Spent on Admission: 22 minutes*  [facility] Pager: [phone] 07/04/2013, 11:20 PM  If 7PM-7AM, please contact night-coverage  [email or web address]  Password [email or web address]

## 2013-07-04 NOTE — ED Provider Notes (Addendum)
CSN: [id number]     Arrival date & time 07/04/13  1703 History   First MD Initiated Contact with Patient 07/04/13 1720     Chief Complaint  Patient presents with  . Chest Pain     (Consider location/radiation/quality/duration/timing/severity/associated sxs/prior Treatment) Patient is a 58 y.o. female presenting with chest pain. The history is provided by the patient and the EMS personnel.  Chest Pain Associated symptoms: diaphoresis, nausea and shortness of breath   Associated symptoms: no abdominal pain, no back pain, no fever, no headache and not vomiting    patient with acute onset of chest pain at 4:15 today. Pain was left substernal area radiates to left arm. Its worst is 10 out of 10 currently at 7/10. Described as a pressure and tightness. Not made worse or better by anything. Associated with shortness of breath associated with nausea associated with diaphoresis. Patient has a cardiac risk factors of hypertension hyperlipidemia and a family history of premature coronary artery disease. Patient was brought in by EMS they gave her one sublingual nitroglycerin without any sniffing change in the pain. Patient also had taken a whole aspirin at home prior to arrival. Patient's had a prior admission for chest pain in May of 2014 and was discharged without any sniffing findings however did not have a stress test or echocardiogram or cardiac cath done. Patient is followed by [facility] cardiology.  Past Medical History  Diagnosis Date  . Meningitis   . Hypertension   . Hyperlipidemia    Past Surgical History  Procedure Laterality Date  . Abdominal hysterectomy    . Thyroid surgery  2002   Family History  Problem Relation Age of Onset  . Heart attack Mother 106  . Heart attack Sister 2   History  Substance Use Topics  . Smoking status: Never Smoker   . Smokeless tobacco: Never Used  . Alcohol Use: No   OB History   Grav Para Term Preterm Abortions TAB SAB Ect Mult Living            0      Review of Systems  Constitutional: Positive for diaphoresis. Negative for fever.  HENT: Negative for congestion.   Eyes: Negative for visual disturbance.  Respiratory: Positive for chest tightness and shortness of breath.   Cardiovascular: Positive for chest pain.  Gastrointestinal: Positive for nausea. Negative for vomiting and abdominal pain.  Genitourinary: Negative for dysuria.  Musculoskeletal: Negative for back pain.  Skin: Negative for rash.  Neurological: Negative for syncope and headaches.  Hematological: Does not bruise/bleed easily.  Psychiatric/Behavioral: Negative for confusion.      Allergies  Review of patient's allergies indicates no known allergies.  Home Medications   Current Outpatient Rx  Name  Route  Sig  Dispense  Refill  . atorvastatin (LIPITOR) 20 MG tablet   Oral   Take 20 mg by mouth at bedtime.         . hydrochlorothiazide (HYDRODIURIL) 25 MG tablet   Oral   Take 1 tablet (25 mg total) by mouth daily.   30 tablet   6   . lisinopril (PRINIVIL,ZESTRIL) 10 MG tablet   Oral   Take 1 tablet (10 mg total) by mouth daily.   30 tablet   11   . potassium chloride (K-DUR) 10 MEQ tablet   Oral   Take 1 tablet (10 mEq total) by mouth daily.   30 tablet   11    BP 108/61  Pulse 49  Temp(Src) 98.4  F (36.9 C) (Oral)  Resp 21  Ht 5\' 9"  (1.753 m)  Wt 200 lb (90.719 kg)  BMI 29.52 kg/m2  SpO2 96% Physical Exam  Nursing note and vitals reviewed. Constitutional: She is oriented to person, place, and time. She appears well-developed and well-nourished. No distress.  HENT:  Head: Normocephalic and atraumatic.  Mouth/Throat: Oropharynx is clear and moist.  Eyes: Conjunctivae and EOM are normal. Pupils are equal, round, and reactive to light.  Neck: Normal range of motion. Neck supple.  Cardiovascular: Normal rate, regular rhythm and normal heart sounds.   No murmur heard. Pulmonary/Chest: Effort normal and breath sounds normal. No  respiratory distress. She has no wheezes. She has no rales. She exhibits no tenderness.  Abdominal: Soft. Bowel sounds are normal. There is no tenderness.  Musculoskeletal: Normal range of motion. She exhibits edema.  Bilateral lower ext  edema.  Neurological: She is alert and oriented to person, place, and time. No cranial nerve deficit. She exhibits normal muscle tone. Coordination normal.  Skin: Skin is warm. No rash noted.    ED Course  Procedures (including critical care time) Labs Review Labs Reviewed  CBC WITH DIFFERENTIAL - Abnormal; Notable for the following:    WBC 3.9 (*)    RBC 3.74 (*)    Hemoglobin 11.2 (*)    HCT 33.3 (*)    All other components within normal limits  D-DIMER, QUANTITATIVE - Abnormal; Notable for the following:    D-Dimer, Quant 1.92 (*)    All other components within normal limits  TROPONIN I  COMPREHENSIVE METABOLIC PANEL  LIPASE, BLOOD  PRO B NATRIURETIC PEPTIDE   Results for orders placed during the hospital encounter of 07/04/13  CBC WITH DIFFERENTIAL      Result Value Ref Range   WBC 3.9 (*) 4.0 - 10.5 K/uL   RBC 3.74 (*) 3.87 - 5.11 MIL/uL   Hemoglobin 11.2 (*) 12.0 - 15.0 g/dL   HCT 33.3 (*) 36.0 - 46.0 %   MCV 89.0  78.0 - 100.0 fL   MCH 29.9  26.0 - 34.0 pg   MCHC 33.6  30.0 - 36.0 g/dL   RDW 13.5  11.5 - 15.5 %   Platelets 250  150 - 400 K/uL   Neutrophils Relative % 52  43 - 77 %   Neutro Abs 2.0  1.7 - 7.7 K/uL   Lymphocytes Relative 38  12 - 46 %   Lymphs Abs 1.5  0.7 - 4.0 K/uL   Monocytes Relative 7  3 - 12 %   Monocytes Absolute 0.3  0.1 - 1.0 K/uL   Eosinophils Relative 3  0 - 5 %   Eosinophils Absolute 0.1  0.0 - 0.7 K/uL   Basophils Relative 0  0 - 1 %   Basophils Absolute 0.0  0.0 - 0.1 K/uL  D-DIMER, QUANTITATIVE      Result Value Ref Range   D-Dimer, Quant 1.92 (*) 0.00 - 0.48 ug/mL-FEU    Imaging Review Dg Chest 2 View  07/04/2013   CLINICAL DATA:  Chest pain.  EXAM: CHEST  2 VIEW  COMPARISON:  01/10/2013.   FINDINGS: The heart size and mediastinal contours are within normal limits. Both lungs are clear except for minimal right basilar atelectasis. The visualized skeletal structures are unremarkable.  IMPRESSION: No active cardiopulmonary disease.   Electronically Signed   By: Kalman Jewels M.D.   On: 07/04/2013 19:50     EKG Interpretation   Date/Time:  Thursday July 04 2013 17:10:03 EDT Ventricular Rate:  65 PR Interval:  176 QRS Duration: 62 QT Interval:  400 QTC Calculation: 416 R Axis:   36 Text Interpretation:  Normal sinus rhythm Septal infarct , age  undetermined Abnormal ECG When compared with ECG of 04-Sep-2012 16:28,  Septal infarct is now Present Confirmed by Quadir Muns  MD, Raegyn Renda [phone]) on  07/04/2013 5:21:39 PM      MDM   Final diagnoses:  Chest pain    Patient with acute onset of the chest pain at about 4:15 in the afternoon. Left substernal radiating to left arm described as a tightness and pressure. At worst it was 10 out of 10 upon arrival here was 7/10. EMS did give her one sublingual nitroglycerin without much change. Patient did take a full aspirin at home. Symptoms were associated with shortness of breath some nausea diaphoresis patient has significant cardiac risk factors are hypertension hyperlipidemia and a very strong family history for premature coronary disease. Patient had a hysterectomy in the past but does have one ovary remaining.  Chest x-ray negative for pneumothorax pulmonary edema or pneumonia. Patient's d-dimer was elevated so CT angios chest will be needed. Patient given 3 more sublingual nitroglycerin here without any change in the chest pain. Will try morphine. Patient will require admission obviously a PE is present will require admission for that otherwise patient will require admission for rule out. Patient has been followed by [facility] cardiology in the past.  EKG without any acute changes. Nothing on there consistent with acute coronary  process.  Patient's pain has suddenly improved of with IV morphine for the sublingual nitroglycerin. We'll give some more IV morphine. CT angios pending of the chest.  Mervin Kung, MD 07/04/13 2107  CT angios still pending. There were spoke with the hospitalist patient will require admission either way. Temporary admit orders placed for telemetry admission. Patient remains stable. Patient has a pulmonary embolus or needs rule out for acute coronary disease.  Mervin Kung, MD 07/04/13 2156

## 2013-07-04 NOTE — ED Notes (Addendum)
Pt reports 10/10 squeezing chest pain that began at 1600 today. CP associated with nausea and diaphoresis. Pt took 162 mg ASA at home. On arrival CP 7/10.  EKG completed. No apparent distress.

## 2013-07-05 ENCOUNTER — Inpatient Hospital Stay (HOSPITAL_COMMUNITY): Payer: Medicaid Other

## 2013-07-05 DIAGNOSIS — I517 Cardiomegaly: Secondary | ICD-10-CM

## 2013-07-05 DIAGNOSIS — I5032 Chronic diastolic (congestive) heart failure: Secondary | ICD-10-CM | POA: Diagnosis present

## 2013-07-05 DIAGNOSIS — E785 Hyperlipidemia, unspecified: Secondary | ICD-10-CM | POA: Diagnosis present

## 2013-07-05 DIAGNOSIS — R600 Localized edema: Secondary | ICD-10-CM | POA: Diagnosis present

## 2013-07-05 LAB — COMPREHENSIVE METABOLIC PANEL
ALT: 26 U/L (ref 0–35)
AST: 23 U/L (ref 0–37)
Albumin: 3.9 g/dL (ref 3.5–5.2)
Alkaline Phosphatase: 70 U/L (ref 39–117)
BUN: 11 mg/dL (ref 6–23)
CALCIUM: 9.3 mg/dL (ref 8.4–10.5)
CO2: 28 meq/L (ref 19–32)
CREATININE: 0.79 mg/dL (ref 0.50–1.10)
Chloride: 103 mEq/L (ref 96–112)
GFR, EST NON AFRICAN AMERICAN: 89 mL/min — AB (ref >90)
GLUCOSE: 100 mg/dL — AB (ref 70–99)
Potassium: 3.9 mEq/L (ref 3.7–5.3)
Sodium: 141 mEq/L (ref 137–147)
Total Bilirubin: 0.4 mg/dL (ref 0.3–1.2)
Total Protein: 8.2 g/dL (ref 6.0–8.3)

## 2013-07-05 LAB — LIPID PANEL
CHOL/HDL RATIO: 2.2 ratio
CHOLESTEROL: 102 mg/dL (ref 0–200)
HDL: 46 mg/dL (ref >39)
LDL Cholesterol: 37 mg/dL (ref 0–99)
Triglycerides: 97 mg/dL (ref <150)
VLDL: 19 mg/dL (ref 0–40)

## 2013-07-05 LAB — TROPONIN I: Troponin I: <0.3 ng/mL (ref <0.30)

## 2013-07-05 MED ORDER — HYDROCHLOROTHIAZIDE 25 MG PO TABS: 12.5000 mg | ORAL_TABLET | Freq: Every day | ORAL | Status: DC

## 2013-07-05 MED ORDER — ISOSORBIDE MONONITRATE ER 30 MG PO TB24: 30.0000 mg | ORAL_TABLET | Freq: Every day | ORAL | Status: DC

## 2013-07-05 MED ORDER — LISINOPRIL 10 MG PO TABS: 5.0000 mg | ORAL_TABLET | Freq: Every day | ORAL | Status: DC

## 2013-07-05 NOTE — Discharge Summary (Signed)
   I agree with the History/assesment & plan per Midlevel provider as per above Further details as follows:-              I agree with the history of present illness as above patient with multiple family members premature coronary artery disease mother at age 59 died of a heart attack, sister age 59 died of heart attack admitted overnight noted to have substernal chest pressure 10/10 with minimal activity and <one-week history of lower extremity swelling. States that pain was improved the most in the EMS truck with oral nitroglycerin sublingual. Morphine did not help aspirin did not help. She was at the time of having the pain doing some housework and sweeping and mopping arose. She states that she's never had pain like before.  Workup performed shows negative troponins x3, EKG 3/20 gross supple T-wave inversions V2/V3. Echocardiogram however shows no wall motion abnormalities and cardiac catheterization was done in May 2014 which was essentially clean.  I've reviewed the database and discussed briefly with cardiology practitioner-I do think patient may have some gastric spasm causing her pain. It is less likely to be keep syndrome or costochondritis but she does have point tenderness in the left breast.  She will need to go home on Imdur 30 mg and I downward adjusted her lisinopril and HCTZ 2 to 5 mg and 12.5 respectively. She will need Imdur 30 mg until seen by Ms. Lawrence.  I had a long discussion with her about the possibility that if this is cardiac she will need to return to the emergency room and we will repeat this process again however at this stage I do not think she needs cardiac catheterization again given lack of Ancillary findings  Verneita Griffes, MD [facility] (P) [phone]

## 2013-07-05 NOTE — Care Management Note (Signed)
    Page 1 of 1   07/05/2013     1:08:27 PM   CARE MANAGEMENT NOTE 07/05/2013  Patient:  Hannah Cooper, Hannah Cooper   Account Number:  [id number]  Date Initiated:  07/05/2013  Documentation initiated by:  Claretha Cooper  Subjective/Objective Assessment:   [place]. Alert oriented. No hh anticipated. Will follow     Action/Plan:   Anticipated DC Date:     Anticipated DC Plan:  [place]  CM consult      Choice offered to / List presented to:             Status of service:  Completed, signed off Medicare Important Message given?   (If response is "NO", the following Medicare IM given date fields will be blank) Date Medicare IM given:   Date Additional Medicare IM given:    Discharge Disposition:    Per UR Regulation:    If discussed at Long Length of Stay Meetings, dates discussed:    Comments:  07/05/13 Claretha Cooper RN BSN CM

## 2013-07-05 NOTE — Care Management Utilization Note (Signed)
UR completed 

## 2013-07-05 NOTE — Progress Notes (Signed)
*  PRELIMINARY RESULTS* Echocardiogram 2D Echocardiogram has been performed.  [place], [place] 07/05/2013, 12:12 PM

## 2013-07-05 NOTE — Discharge Summary (Signed)
Physician Discharge Summary  Hannah Cooper WEX:[id number] DOB: 05/20/1955 DOA: 07/04/2013  PCP: Maggie Font, MD  Admit date: 07/04/2013 Discharge date: 07/05/2013  Time spent: 40 minutes  Recommendations for Outpatient Follow-up:  1. [name] 07/12/13 for follow up chest pain and LE edema. Follow lipid panel as well. 2. PCP in 1-2 weeks for evaluation of pain as ACS workup negative. Follow TSH.  Discharge Diagnoses:  Principal Problem:   Chest pain Active Problems:   HYPERTENSION   Hyperlipidemia   Chronic diastolic CHF (congestive heart failure)   Lower extremity edema   Discharge Condition: stable  Diet recommendation: heart healthy  Filed Weights   07/04/13 1713  Weight: 90.719 kg (200 lb)    History of present illness:  58 year old female who has a past medical history of Meningitis; Hypertension; and Hyperlipidemia. presented to the ED on 07/04/13 with chest pain, shortness of breath and diaphoresis which started around 4:15 PM. Pain described as pressure 10/10 in intensity in the midsternal region with radiation to the left side and left arm. No aggravating or relieving factors. It was associated with shortness of breath and diaphoresis but no nausea or vomiting. Patient had a history of hyperlipidemia, hypertension and has a significant family history of coronary artery disease.  In ED patient received one sublingual nitroglycerin with no significant improvement in the pain. Patient had a previous admission for chest pain in May of 2014 s/p cardiac catheter demonstrating normal coronary arteries. She was found to have diastolic CHF. She also complained of cough without any phlegm. She denied any history of tobacco abuse no history of emphysema. Patient takes HCTZ for hypertension. CT angiogram was ordered which is negative for pulmonary embolism.  Hospital Course:  Chest pain   Atypical. Admitted to telemetry. No events on tele, cardiac enzymes negative x3. CT  angiogram negative for PE.. 2-D echocardiogram results reveal mild to moderate LVH and 65-70% EF. EKG shows nonspecific ST-T changes. No acute changes. Patient with pain in left shoulder from OP surgical procedure (removal of cyst per Dr Romona Curls) but no CP on discharge.  Patient had cardiac cath 08/2012 revealing normal coronary arteries and diastolic CHF. This has been managed medically with HCTZ and lisinopril.  Discussed with cardiology who opine that given she had above results from recent cardiac cath and ACS workup is negative she can follow up OP. She has appointment 07/12/13 with [name] NP.  Hypertension   Controlled during this hospitalization. Continue lisinopril HCTZ at discharge.   Hyperlipidemia  Continue Lipitor. Lipid panel pending at discharge Lower extremity edema Left > right with some tenderness. Bilateral LE doppler negative for DVT. Improved at discharge. Will follow up with cardiology 1 week.    Procedures:  07/05/13 bilateral LE doppler negative for DVT  07/05/13 echocardiogram   Consultations:  none  Discharge Exam: Filed Vitals:   07/05/13 1101  BP: 121/74  Pulse:   Temp:   Resp:     General: calm appears comfortable Cardiovascular: RRR no m/g/r trace LE edema mild tenderness to palpation Respiratory: normal effort BS clear bilaterally no wheeze no rhonchi Skin: left shoulder s/p cyst removal. Site clean and dry. Tissue pink. No drainage or odor. Dressing dry and intact.   Discharge Instructions       Future Appointments Provider Department Dept Phone   07/12/2013 2:10 PM Lendon Colonel, NP [facility] Linna Hoff [phone]       Medication List         atorvastatin 20 MG  tablet  Commonly known as:  LIPITOR  Take 20 mg by mouth at bedtime.     hydrochlorothiazide 25 MG tablet  Commonly known as:  HYDRODIURIL  Take 1 tablet (25 mg total) by mouth daily.     lisinopril 10 MG tablet  Commonly known as:  PRINIVIL,ZESTRIL   Take 1 tablet (10 mg total) by mouth daily.     potassium chloride 10 MEQ tablet  Commonly known as:  K-DUR  Take 1 tablet (10 mEq total) by mouth daily.       No Known Allergies Follow-up Information   Follow up with [name], NP On 07/12/2013. (appointment at 2:10pm)    Specialty:  Nurse Practitioner   Contact information:   [place] [address] [phone]       Follow up with Maggie Font, MD. Schedule an appointment as soon as possible for a visit in 2 weeks. (follow up on chest pain as ACS work up negative.)    Specialty:  Family Medicine   Contact information:   [place] [address] [phone]        The results of significant diagnostics from this hospitalization (including imaging, microbiology, ancillary and laboratory) are listed below for reference.    Significant Diagnostic Studies: Dg Chest 2 View  07/04/2013   CLINICAL DATA:  Chest pain.  EXAM: CHEST  2 VIEW  COMPARISON:  01/10/2013.  FINDINGS: The heart size and mediastinal contours are within normal limits. Both lungs are clear except for minimal right basilar atelectasis. The visualized skeletal structures are unremarkable.  IMPRESSION: No active cardiopulmonary disease.   Electronically Signed   By: Kalman Jewels M.D.   On: 07/04/2013 19:50   Ct Angio Chest Pe W/cm &/or Wo Cm  07/04/2013   CLINICAL DATA:  Chest pain for 10 hr.  EXAM: CT ANGIOGRAPHY CHEST WITH CONTRAST  TECHNIQUE: Multidetector CT imaging of the chest was performed using the standard protocol during bolus administration of intravenous contrast. Multiplanar CT image reconstructions and MIPs were obtained to evaluate the vascular anatomy.  CONTRAST:  100 mL OMNIPAQUE IOHEXOL 350 MG/ML SOLN  COMPARISON:  PA and lateral chest 07/04/2013 at 1926 hr  FINDINGS: No pulmonary embolus is identified. Heart size is normal. No pleural or pericardial effusion. No axillary, hilar or mediastinal  lymphadenopathy. Mild dependent atelectasis is noted. Lungs otherwise clear. Imaged upper abdomen unremarkable. No focal bony abnormality.  Review of the MIP images confirms the above findings.  IMPRESSION: Negative for pulmonary embolus.  Negative exam.   Electronically Signed   By: Inge Rise M.D.   On: 07/04/2013 23:06   US Venous Img Lower Bilateral  07/05/2013   CLINICAL DATA:  Bilateral lower extremity swelling  EXAM: Bilateral. LOWER EXTREMITY VENOUS DOPPLER ULTRASOUND  TECHNIQUE: Gray-scale sonography with graded compression, as well as color Doppler and duplex ultrasound, were performed to evaluate the deep venous system from the level of the common femoral vein through the popliteal and proximal calf veins. Spectral Doppler was utilized to evaluate flow at rest and with distal augmentation maneuvers.  COMPARISON:  None.  FINDINGS: Thrombus within deep veins:  None visualized.  Compressibility of deep veins:  Normal.  Duplex waveform respiratory phasicity:  Normal.  Duplex waveform response to augmentation:  Normal.  Venous reflux:  None visualized.  Other findings:  None visualized.  IMPRESSION: There is no evidence of thrombus within the deep veins of the right or left lower extremity.  Electronically Signed   By: David  Martinique   On: 07/05/2013 11:20    Microbiology: No results found for this or any previous visit (from the past 240 hour(s)).   Labs: Basic Metabolic Panel:  Recent Labs Lab 07/04/13 1948 07/05/13 0537  NA 142 141  K 3.9 3.9  CL 104 103  CO2 29 28  GLUCOSE 92 100*  BUN 14 11  CREATININE 0.78 0.79  CALCIUM 9.3 9.3   Liver Function Tests:  Recent Labs Lab 07/04/13 1948 07/05/13 0537  AST 26 23  ALT 29 26  ALKPHOS 75 70  BILITOT 0.2* 0.4  PROT 8.4* 8.2  ALBUMIN 4.0 3.9    Recent Labs Lab 07/04/13 1948  LIPASE 39   No results found for this basename: AMMONIA,  in the last 168 hours CBC:  Recent Labs Lab 07/04/13 1948  WBC 3.9*  NEUTROABS  2.0  HGB 11.2*  HCT 33.3*  MCV 89.0  PLT 250   Cardiac Enzymes:  Recent Labs Lab 07/04/13 1948 07/05/13 0003 07/05/13 0537  TROPONINI <0.30 <0.30 <0.30   BNP: BNP (last 3 results)  Recent Labs  07/04/13 1948  PROBNP 19.6   CBG: No results found for this basename: GLUCAP,  in the last 168 hours     Signed:  Radene Gunning  [facility] 07/05/2013, 2:17 PM

## 2013-07-06 LAB — TSH: TSH: 4.995 u[IU]/mL — AB (ref 0.350–4.500)

## 2013-07-12 ENCOUNTER — Encounter: Payer: Self-pay | Admitting: Adult Health

## 2013-07-12 ENCOUNTER — Ambulatory Visit (INDEPENDENT_AMBULATORY_CARE_PROVIDER_SITE_OTHER): Payer: Medicaid Other | Admitting: Adult Health

## 2013-07-12 VITALS — BP 156/82 | HR 68 | Ht 69.0 in | Wt 208.0 lb

## 2013-07-12 DIAGNOSIS — I5032 Chronic diastolic (congestive) heart failure: Secondary | ICD-10-CM

## 2013-07-12 DIAGNOSIS — I509 Heart failure, unspecified: Secondary | ICD-10-CM

## 2013-07-12 MED ORDER — HYDROCHLOROTHIAZIDE 25 MG PO TABS: 25.0000 mg | ORAL_TABLET | Freq: Every day | ORAL | Status: DC

## 2013-07-12 NOTE — Patient Instructions (Signed)
Your physician recommends that you schedule a follow-up appointment in: 1 month  Ultrasound of Gallbladder  Your physician has recommended you make the following change in your medication:  Increased HCTZ 25 mg daily  Your physician recommends that you return for lab work today. CMET

## 2013-07-12 NOTE — Assessment & Plan Note (Signed)
Ultrasound completed of her gallbladder to rule out cholelithiasis. She is mildly tender in that area although no frank positive Murphy sign. She does have some abdominal discomfort in the lower gastric area after palpation of the gallbladder. She may need further GI workup should this become necessary as she is been ruled out for CAD from our standpoint.

## 2013-07-12 NOTE — Assessment & Plan Note (Signed)
Blood pressure is elevated on this visit. She has been taking her isosorbide yet. She states this is giving her headache. I have advised her to take Tylenol every 6 hours when necessary headache and her body does become uses this medication. I have increased her HCTZ to 25 mg daily.

## 2013-07-12 NOTE — Progress Notes (Signed)
    HPI: [name] is a 58 year old patient to be est. with Dr. Harl Cooper, we are following for ongoing assessment and management of recurrent chest pain, hypertension, and diastolic CHF. The patient is a former patient of Dr. Lattie Cooper, with recent admission to [facility] in the setting of recurrent chest pain with cardiac catheterization in May of 2014 demonstrating normal coronary arteries, but she was found to have diastolic CHF at that time.  During recent hospitalization, the patient's chest pain was that of atypical, cardiac enzymes are found be negative. EKG was negative for ACS, 2-D echocardiogram revealed mild to moderate LVH with EF of 65-70%. She is here for post hospitalization followup.  She comes today with recurrent discomfort in her chest but it is much better. Usually occurring with exertion, just has some generalized fatigue. She is suffering from headaches with use of isosorbide. We discussed her cardiac catheterization results which is reassuring, but she was unaware that she had a history of diastolic CHF.    No Known Allergies  Current Outpatient Prescriptions  Medication Sig Dispense Refill  . atorvastatin (LIPITOR) 20 MG tablet Take 20 mg by mouth at bedtime.      . hydrochlorothiazide (HYDRODIURIL) 25 MG tablet Take 0.5 tablets (12.5 mg total) by mouth daily.  30 tablet  6  . isosorbide mononitrate (IMDUR) 30 MG 24 hr tablet Take 1 tablet (30 mg total) by mouth daily.  30 tablet  0  . lisinopril (PRINIVIL,ZESTRIL) 10 MG tablet Take 0.5 tablets (5 mg total) by mouth daily.  30 tablet  11  . potassium chloride (K-DUR) 10 MEQ tablet Take 1 tablet (10 mEq total) by mouth daily.  30 tablet  11   No current facility-administered medications for this visit.    Past Medical History  Diagnosis Date  . Meningitis   . Hypertension   . Hyperlipidemia     Past Surgical History  Procedure Laterality Date  . Abdominal hysterectomy    . Thyroid surgery  2002     JIR:CVELFY of systems complete and found to be negative unless listed above  PHYSICAL EXAM BP 156/82  Pulse 68  Ht 5\' 9"  (1.753 m)  Wt 208 lb (94.348 kg)  BMI 30.70 kg/m2  General: Well developed, well nourished, in no acute distress Head: Eyes PERRLA, No xanthomas.   Normal cephalic and atramatic  Lungs: Clear bilaterally to auscultation and percussion. Heart: HRRR S1 S2, without MRG.  Pulses are 2+ & equal.            No carotid bruit. No JVD.  No abdominal bruits. No femoral bruits. Abdomen: Bowel sounds are positive, abdomen soft and non-tender without masses or                  Hernia's noted. Negative Murphy sign. Msk:  Back normal, normal gait. Normal strength and tone for age. Extremities: No clubbing, cyanosis or edema.  DP +1 Neuro: Alert and oriented X 3. Psych:  Good affect, responds appropriately    ASSESSMENT AND PLAN

## 2013-07-12 NOTE — Assessment & Plan Note (Signed)
No evidence of fluid overload currently. Her HCTZ was decreased to 12.5 mg and she is mildly hypertensive on this visit. Increase HCTZ to 25 mg daily. This will help with fluid retention. I will repeat a cemented to evaluate LFTs and kidney status. I have gone into detail concerning diastolic heart failure, our treatment of taking blood pressure under control and fluid retention down. She is advised on low sodium diet.

## 2013-07-12 NOTE — Progress Notes (Deleted)
Name: Hannah Cooper    DOB: 02/12/1956  Age: 58 y.o.  MR#: [id number]       PCP:  Maggie Font, MD      Insurance: Payor: MEDICAID [place] / Plan: MEDICAID [place] ACCESS / Product Type: *No Product type* /   CC:    Chief Complaint  Patient presents with  . Chest Pain  . Congestive Heart Failure  . Hypertension    VS Filed Vitals:   07/12/13 1400  BP: 156/82  Pulse: 68  Height: 5\' 9"  (1.753 m)  Weight: 208 lb (94.348 kg)    Weights Current Weight  07/12/13 208 lb (94.348 kg)  07/04/13 200 lb (90.719 kg)  05/28/13 190 lb (86.183 kg)    Blood Pressure  BP Readings from Last 3 Encounters:  07/12/13 156/82  07/05/13 122/67  05/28/13 161/75     Admit date:  (Not on file) Last encounter with RMR:  Visit date not found   Allergy Review of patient's allergies indicates no known allergies.  Current Outpatient Prescriptions  Medication Sig Dispense Refill  . atorvastatin (LIPITOR) 20 MG tablet Take 20 mg by mouth at bedtime.      . hydrochlorothiazide (HYDRODIURIL) 25 MG tablet Take 0.5 tablets (12.5 mg total) by mouth daily.  30 tablet  6  . isosorbide mononitrate (IMDUR) 30 MG 24 hr tablet Take 1 tablet (30 mg total) by mouth daily.  30 tablet  0  . lisinopril (PRINIVIL,ZESTRIL) 10 MG tablet Take 0.5 tablets (5 mg total) by mouth daily.  30 tablet  11  . potassium chloride (K-DUR) 10 MEQ tablet Take 1 tablet (10 mEq total) by mouth daily.  30 tablet  11   No current facility-administered medications for this visit.    Discontinued Meds:   There are no discontinued medications.  Patient Active Problem List   Diagnosis Date Noted  . Chronic diastolic CHF (congestive heart failure) 07/05/2013  . Lower extremity edema 07/05/2013  . Hyperlipidemia   . Chest pain 07/04/2013  . Hypokalemia 09/04/2012  . Precordial pain 09/04/2012  . Viral meningitis 01/22/2011  . Vomiting 01/22/2011  . PNEUMONIA, LEFT LOWER LOBE 10/10/2006  . DISEASE, ACUTE BRONCHOSPASM 10/10/2006   . HYPOTHYROIDISM 05/23/2006  . HYPERTENSION 05/23/2006  . OSTEOARTHRITIS 05/23/2006    LABS    Component Value Date/Time   NA 141 07/05/2013 0537   NA 142 07/04/2013 1948   NA 139 09/20/2012 1412   K 3.9 07/05/2013 0537   K 3.9 07/04/2013 1948   K 3.8 09/20/2012 1412   CL 103 07/05/2013 0537   CL 104 07/04/2013 1948   CL 104 09/20/2012 1412   CO2 28 07/05/2013 0537   CO2 29 07/04/2013 1948   CO2 28 09/20/2012 1412   GLUCOSE 100* 07/05/2013 0537   GLUCOSE 92 07/04/2013 1948   GLUCOSE 93 09/20/2012 1412   BUN 11 07/05/2013 0537   BUN 14 07/04/2013 1948   BUN 14 09/20/2012 1412   CREATININE 0.79 07/05/2013 0537   CREATININE 0.78 07/04/2013 1948   CREATININE 0.81 09/20/2012 1412   CREATININE 0.67 09/06/2012 0545   CALCIUM 9.3 07/05/2013 0537   CALCIUM 9.3 07/04/2013 1948   CALCIUM 9.3 09/20/2012 1412   GFRNONAA 89* 07/05/2013 0537   GFRNONAA 90* 07/04/2013 1948   GFRNONAA >90 09/06/2012 0545   GFRAA >90 07/05/2013 0537   GFRAA >90 07/04/2013 1948   GFRAA >90 09/06/2012 0545   CMP     Component Value Date/Time   NA 141 07/05/2013  0537   K 3.9 07/05/2013 0537   CL 103 07/05/2013 0537   CO2 28 07/05/2013 0537   GLUCOSE 100* 07/05/2013 0537   BUN 11 07/05/2013 0537   CREATININE 0.79 07/05/2013 0537   CREATININE 0.81 09/20/2012 1412   CALCIUM 9.3 07/05/2013 0537   PROT 8.2 07/05/2013 0537   ALBUMIN 3.9 07/05/2013 0537   AST 23 07/05/2013 0537   ALT 26 07/05/2013 0537   ALKPHOS 70 07/05/2013 0537   BILITOT 0.4 07/05/2013 0537   GFRNONAA 89* 07/05/2013 0537   GFRAA >90 07/05/2013 0537       Component Value Date/Time   WBC 3.9* 07/04/2013 1948   WBC 3.3* 09/05/2012 0222   WBC 3.7* 09/04/2012 1634   HGB 11.2* 07/04/2013 1948   HGB 11.0* 09/05/2012 0222   HGB 12.4 09/04/2012 1634   HCT 33.3* 07/04/2013 1948   HCT 32.4* 09/05/2012 0222   HCT 36.2 09/04/2012 1634   MCV 89.0 07/04/2013 1948   MCV 88.0 09/05/2012 0222   MCV 88.1 09/04/2012 1634    Lipid Panel     Component Value Date/Time   CHOL 102 07/05/2013 0537    TRIG 97 07/05/2013 0537   HDL 46 07/05/2013 0537   CHOLHDL 2.2 07/05/2013 0537   VLDL 19 07/05/2013 0537   LDLCALC 37 07/05/2013 0537    ABG No results found for this basename: phart, pco2, pco2art, po2, po2art, hco3, tco2, acidbasedef, o2sat     Lab Results  Component Value Date   TSH 4.995* 07/05/2013   BNP (last 3 results)  Recent Labs  07/04/13 1948  PROBNP 19.6   Cardiac Panel (last 3 results) No results found for this basename: CKTOTAL, CKMB, TROPONINI, RELINDX,  in the last 72 hours  Iron/TIBC/Ferritin No results found for this basename: iron, tibc, ferritin     EKG Orders placed during the hospital encounter of 07/04/13  . EKG 12-LEAD  . EKG 12-LEAD  . EKG     Prior Assessment and Plan Problem List as of 07/12/2013     Cardiovascular and Mediastinum   HYPERTENSION   Last Assessment & Plan   11/12/2012 Office Visit Written 11/12/2012  4:45 PM by Lendon Colonel, NP     Excellent control of blood pressure on current medication regimen. We will not change anything at this time. I have given her refill on HCTZ. Labs have been reviewed and are found be within normal limits. We will see her again in 6 months unless she becomes symptomatic    Chronic diastolic CHF (congestive heart failure)     Respiratory   PNEUMONIA, LEFT LOWER LOBE   DISEASE, ACUTE BRONCHOSPASM     Endocrine   HYPOTHYROIDISM   Last Assessment & Plan   09/20/2012 Office Visit Written 09/20/2012  2:15 PM by Lendon Colonel, NP     Recheck THS      Nervous and Auditory   Viral meningitis     Musculoskeletal and Integument   OSTEOARTHRITIS     Other   Vomiting   Hypokalemia   Last Assessment & Plan   11/12/2012 Office Visit Written 11/12/2012  4:46 PM by Lendon Colonel, NP     Recent labs demonstrate a potassium of 4.6.     Precordial pain   Last Assessment & Plan   09/20/2012 Office Visit Written 09/20/2012  2:16 PM by Lendon Colonel, NP     Resolved.    Chest pain    Hyperlipidemia   Lower extremity edema  Imaging: Dg Chest 2 View  07/04/2013   CLINICAL DATA:  Chest pain.  EXAM: CHEST  2 VIEW  COMPARISON:  01/10/2013.  FINDINGS: The heart size and mediastinal contours are within normal limits. Both lungs are clear except for minimal right basilar atelectasis. The visualized skeletal structures are unremarkable.  IMPRESSION: No active cardiopulmonary disease.   Electronically Signed   By: Kalman Jewels M.D.   On: 07/04/2013 19:50   Ct Angio Chest Pe W/cm &/or Wo Cm  07/04/2013   CLINICAL DATA:  Chest pain for 10 hr.  EXAM: CT ANGIOGRAPHY CHEST WITH CONTRAST  TECHNIQUE: Multidetector CT imaging of the chest was performed using the standard protocol during bolus administration of intravenous contrast. Multiplanar CT image reconstructions and MIPs were obtained to evaluate the vascular anatomy.  CONTRAST:  100 mL OMNIPAQUE IOHEXOL 350 MG/ML SOLN  COMPARISON:  PA and lateral chest 07/04/2013 at 1926 hr  FINDINGS: No pulmonary embolus is identified. Heart size is normal. No pleural or pericardial effusion. No axillary, hilar or mediastinal lymphadenopathy. Mild dependent atelectasis is noted. Lungs otherwise clear. Imaged upper abdomen unremarkable. No focal bony abnormality.  Review of the MIP images confirms the above findings.  IMPRESSION: Negative for pulmonary embolus.  Negative exam.   Electronically Signed   By: Inge Rise M.D.   On: 07/04/2013 23:06   US Venous Img Lower Bilateral  07/05/2013   CLINICAL DATA:  Bilateral lower extremity swelling  EXAM: Bilateral. LOWER EXTREMITY VENOUS DOPPLER ULTRASOUND  TECHNIQUE: Gray-scale sonography with graded compression, as well as color Doppler and duplex ultrasound, were performed to evaluate the deep venous system from the level of the common femoral vein through the popliteal and proximal calf veins. Spectral Doppler was utilized to evaluate flow at rest and with distal augmentation maneuvers.  COMPARISON:   None.  FINDINGS: Thrombus within deep veins:  None visualized.  Compressibility of deep veins:  Normal.  Duplex waveform respiratory phasicity:  Normal.  Duplex waveform response to augmentation:  Normal.  Venous reflux:  None visualized.  Other findings:  None visualized.  IMPRESSION: There is no evidence of thrombus within the deep veins of the right or left lower extremity.   Electronically Signed   By: David  Martinique   On: 07/05/2013 11:20

## 2013-07-13 LAB — COMPREHENSIVE METABOLIC PANEL
ALK PHOS: 62 U/L (ref 39–117)
ALT: 24 U/L (ref 0–35)
AST: 18 U/L (ref 0–37)
Albumin: 4.3 g/dL (ref 3.5–5.2)
BILIRUBIN TOTAL: 0.3 mg/dL (ref 0.2–1.2)
BUN: 13 mg/dL (ref 6–23)
CO2: 29 mEq/L (ref 19–32)
Calcium: 9.5 mg/dL (ref 8.4–10.5)
Chloride: 103 mEq/L (ref 96–112)
Creat: 0.85 mg/dL (ref 0.50–1.10)
GLUCOSE: 100 mg/dL — AB (ref 70–99)
Potassium: 4.1 mEq/L (ref 3.5–5.3)
Sodium: 139 mEq/L (ref 135–145)
Total Protein: 7.5 g/dL (ref 6.0–8.3)

## 2013-07-15 ENCOUNTER — Encounter: Payer: Self-pay | Admitting: *Deleted

## 2013-07-17 ENCOUNTER — Emergency Department (HOSPITAL_COMMUNITY): Payer: Medicaid Other

## 2013-07-17 ENCOUNTER — Emergency Department (HOSPITAL_COMMUNITY)
Admission: EM | Admit: 2013-07-17 | Discharge: 2013-07-18 | Disposition: A | Discharge status: Home / Self Care | Payer: Medicaid Other | Attending: Emergency Medicine | Admitting: Emergency Medicine

## 2013-07-17 ENCOUNTER — Encounter (HOSPITAL_COMMUNITY): Payer: Self-pay | Admitting: Emergency Medicine

## 2013-07-17 DIAGNOSIS — M7989 Other specified soft tissue disorders: Secondary | ICD-10-CM | POA: Insufficient documentation

## 2013-07-17 DIAGNOSIS — E785 Hyperlipidemia, unspecified: Secondary | ICD-10-CM | POA: Insufficient documentation

## 2013-07-17 DIAGNOSIS — Z8669 Personal history of other diseases of the nervous system and sense organs: Secondary | ICD-10-CM | POA: Insufficient documentation

## 2013-07-17 DIAGNOSIS — J441 Chronic obstructive pulmonary disease with (acute) exacerbation: Secondary | ICD-10-CM | POA: Insufficient documentation

## 2013-07-17 DIAGNOSIS — R079 Chest pain, unspecified: Secondary | ICD-10-CM

## 2013-07-17 DIAGNOSIS — Z79899 Other long term (current) drug therapy: Secondary | ICD-10-CM | POA: Insufficient documentation

## 2013-07-17 DIAGNOSIS — I519 Heart disease, unspecified: Secondary | ICD-10-CM | POA: Insufficient documentation

## 2013-07-17 DIAGNOSIS — R51 Headache: Secondary | ICD-10-CM | POA: Insufficient documentation

## 2013-07-17 DIAGNOSIS — R42 Dizziness and giddiness: Secondary | ICD-10-CM | POA: Insufficient documentation

## 2013-07-17 DIAGNOSIS — I10 Essential (primary) hypertension: Secondary | ICD-10-CM | POA: Insufficient documentation

## 2013-07-17 DIAGNOSIS — R110 Nausea: Secondary | ICD-10-CM | POA: Insufficient documentation

## 2013-07-17 LAB — CBC WITH DIFFERENTIAL/PLATELET
Basophils Absolute: 0 10*3/uL (ref 0.0–0.1)
Basophils Relative: 0 % (ref 0–1)
EOS ABS: 0.1 10*3/uL (ref 0.0–0.7)
EOS PCT: 4 % (ref 0–5)
HCT: 33 % — ABNORMAL LOW (ref 36.0–46.0)
Hemoglobin: 11.1 g/dL — ABNORMAL LOW (ref 12.0–15.0)
LYMPHS ABS: 1.7 10*3/uL (ref 0.7–4.0)
Lymphocytes Relative: 45 % (ref 12–46)
MCH: 29.8 pg (ref 26.0–34.0)
MCHC: 33.6 g/dL (ref 30.0–36.0)
MCV: 88.7 fL (ref 78.0–100.0)
MONOS PCT: 9 % (ref 3–12)
Monocytes Absolute: 0.4 10*3/uL (ref 0.1–1.0)
Neutro Abs: 1.5 10*3/uL — ABNORMAL LOW (ref 1.7–7.7)
Neutrophils Relative %: 42 % — ABNORMAL LOW (ref 43–77)
PLATELETS: 251 10*3/uL (ref 150–400)
RBC: 3.72 MIL/uL — AB (ref 3.87–5.11)
RDW: 13.6 % (ref 11.5–15.5)
WBC: 3.7 10*3/uL — ABNORMAL LOW (ref 4.0–10.5)

## 2013-07-17 LAB — COMPREHENSIVE METABOLIC PANEL
ALT: 22 U/L (ref 0–35)
AST: 20 U/L (ref 0–37)
Albumin: 3.9 g/dL (ref 3.5–5.2)
Alkaline Phosphatase: 73 U/L (ref 39–117)
BUN: 12 mg/dL (ref 6–23)
CALCIUM: 9.4 mg/dL (ref 8.4–10.5)
CO2: 29 mEq/L (ref 19–32)
Chloride: 101 mEq/L (ref 96–112)
Creatinine, Ser: 0.8 mg/dL (ref 0.50–1.10)
GFR calc Af Amer: >90 mL/min (ref >90)
GFR calc non Af Amer: 79 mL/min — ABNORMAL LOW (ref >90)
Glucose, Bld: 109 mg/dL — ABNORMAL HIGH (ref 70–99)
Potassium: 3.6 mEq/L — ABNORMAL LOW (ref 3.7–5.3)
SODIUM: 142 meq/L (ref 137–147)
TOTAL PROTEIN: 8.2 g/dL (ref 6.0–8.3)
Total Bilirubin: 0.2 mg/dL — ABNORMAL LOW (ref 0.3–1.2)

## 2013-07-17 LAB — LIPASE, BLOOD: LIPASE: 39 U/L (ref 11–59)

## 2013-07-17 LAB — TROPONIN I

## 2013-07-17 LAB — PRO B NATRIURETIC PEPTIDE: PRO B NATRI PEPTIDE: 36.2 pg/mL (ref 0–125)

## 2013-07-17 MED ORDER — KETOROLAC TROMETHAMINE 30 MG/ML IJ SOLN
30.0000 mg | Freq: Once | INTRAMUSCULAR | Status: AC
Start: 2013-07-17 — End: 2013-07-18
  Administered 2013-07-18: 30 mg via INTRAVENOUS
  Filled 2013-07-17: qty 1

## 2013-07-17 MED ORDER — GI COCKTAIL ~~LOC~~
30.0000 mL | Freq: Once | ORAL | Status: AC
  Administered 2013-07-18: 30 mL via ORAL
  Filled 2013-07-17: qty 30

## 2013-07-17 NOTE — ED Notes (Signed)
Pt with new onset chest pain with radiation to L. Arm. Pt also has nausea and sob associated with CP. Recently discharged about 2 weeks ago for same thing.

## 2013-07-17 NOTE — ED Provider Notes (Signed)
CSN: [id number]     Arrival date & time 07/17/13  2126 History  This chart was scribed for Carmin Muskrat, MD by Marcha Dutton, ED Scribe. This patient was seen in room APA19/APA19 and the patient's care was started at 11:11 PM.    Chief Complaint  Patient presents with  . Chest Pain      The history is provided by the patient. No language interpreter was used.   HPI Comments: Hannah Cooper is a 58 y.o. female with a h/o of COPD who presents to the Emergency Department complaining of moderate to severe chest pain that began 2 hours ago while she was watching tv. She reports her pain as a 9/10 and radiating into her arm left arm and lower back. She states she didn't take any medications to relieve her pain. Pt reports nausea, headache, dizziness, swelling of BLE, and some minor weight gain. Pt denies vomiting. Pt denies h/o of heartburn, kidney issues. She states she was here 2 weeks ago when she had a CT scan and EKG which were both normal. She states she also found out this week that she has diastolic dysfunction.    Past Medical History  Diagnosis Date  . Meningitis   . Hypertension   . Hyperlipidemia    Past Surgical History  Procedure Laterality Date  . Abdominal hysterectomy    . Thyroid surgery  2002   Family History  Problem Relation Age of Onset  . Heart attack Mother 59  . Heart attack Sister 75   History  Substance Use Topics  . Smoking status: Never Smoker   . Smokeless tobacco: Never Used  . Alcohol Use: No   OB History   Grav Para Term Preterm Abortions TAB SAB Ect Mult Living            0     Review of Systems  Constitutional:       Per HPI, otherwise negative  HENT:       Per HPI, otherwise negative  Respiratory: Positive for shortness of breath.        Per HPI, otherwise negative  Cardiovascular: Positive for chest pain and leg swelling.       Per HPI, otherwise negative  Gastrointestinal: Positive for nausea. Negative for vomiting.   Endocrine:       Negative aside from HPI  Genitourinary:       Neg aside from HPI   Musculoskeletal:       Per HPI, otherwise negative  Skin: Negative.   Neurological: Negative for syncope.      Allergies  Review of patient's allergies indicates no known allergies.  Home Medications   Current Outpatient Rx  Name  Route  Sig  Dispense  Refill  . atorvastatin (LIPITOR) 20 MG tablet   Oral   Take 20 mg by mouth at bedtime.         . hydrochlorothiazide (HYDRODIURIL) 25 MG tablet   Oral   Take 1 tablet (25 mg total) by mouth daily.   30 tablet   6   . isosorbide mononitrate (IMDUR) 30 MG 24 hr tablet   Oral   Take 1 tablet (30 mg total) by mouth daily.   30 tablet   0   . lisinopril (PRINIVIL,ZESTRIL) 10 MG tablet   Oral   Take 10 mg by mouth daily.         . potassium chloride (K-DUR) 10 MEQ tablet   Oral   Take 1 tablet (  10 mEq total) by mouth daily.   30 tablet   11    Triage Vitals: BP 136/66  Pulse 57  Temp(Src) 98 F (36.7 C) (Oral)  Resp 16  Ht 5\' 9"  (1.753 m)  Wt 205 lb (92.987 kg)  BMI 30.26 kg/m2  SpO2 98%  Physical Exam  Nursing note and vitals reviewed. Constitutional: She is oriented to person, place, and time. She appears well-developed and well-nourished. No distress.  HENT:  Head: Normocephalic and atraumatic.  Eyes: Conjunctivae and EOM are normal.  Cardiovascular: Normal rate and regular rhythm.   Pulmonary/Chest: Effort normal. No stridor. No respiratory distress.  Abdominal: She exhibits no distension.  Musculoskeletal: She exhibits no edema.  Neurological: She is alert and oriented to person, place, and time. No cranial nerve deficit.  Skin: Skin is warm and dry.  Psychiatric: She has a normal mood and affect.    ED Course  Procedures (including critical care time)  DIAGNOSTIC STUDIES: Oxygen Saturation is 98% on RA, normal by my interpretation.    COORDINATION OF CARE: 11:22 PM- Pt's parents advised of plan for  treatment. Parents verbalize understanding and agreement with plan.     Labs Review Labs Reviewed  CBC WITH DIFFERENTIAL - Abnormal; Notable for the following:    WBC 3.7 (*)    RBC 3.72 (*)    Hemoglobin 11.1 (*)    HCT 33.0 (*)    Neutrophils Relative % 42 (*)    Neutro Abs 1.5 (*)    All other components within normal limits  COMPREHENSIVE METABOLIC PANEL - Abnormal; Notable for the following:    Potassium 3.6 (*)    Glucose, Bld 109 (*)    Total Bilirubin 0.2 (*)    GFR calc non Af Amer 79 (*)    All other components within normal limits  TROPONIN I   Imaging Review Dg Chest Portable 1 View  07/17/2013   CLINICAL DATA:  2 hr history of chest pain  EXAM: PORTABLE CHEST - 1 VIEW  COMPARISON:  Recent prior chest x-ray and CT PE study 07/04/2013  FINDINGS: Slightly lower inspiratory volumes with minimal bibasilar atelectasis. The lungs are clear and negative for focal airspace consolidation, pulmonary edema or suspicious pulmonary nodule. No pleural effusion or pneumothorax. Cardiac and mediastinal contours are within normal limits. No acute fracture or lytic or blastic osseous lesions. The visualized upper abdominal bowel gas pattern is unremarkable.  IMPRESSION: No active cardiopulmonary disease.   Electronically Signed   By: Jacqulynn Cadet M.D.   On: 07/17/2013 22:36     EKG Interpretation   Date/Time:  Wednesday July 17 2013 21:34:04 EDT Ventricular Rate:  63 PR Interval:  166 QRS Duration: 66 QT Interval:  422 QTC Calculation: 431 R Axis:   33 Text Interpretation:  Normal sinus rhythm with sinus arrhythmia Normal ECG  When compared with ECG of 04-Jul-2013 17:10, Criteria for Septal infarct  are no longer Present Sinus rhythm Artifact Abnormal ekg Confirmed by  Carmin Muskrat  MD ([id number]) on 07/18/2013 12:06:38 AM     On repeat exam patient appears comfortable.  She prefers d/c, and will f/u tomorrow w PMD.  MDM    I personally performed the services described in  this documentation, which was scribed in my presence. The recorded information has been reviewed and is accurate.  Patient presents with concerns of chest pain.  Notably, patient's recent echocardiogram, catheterization, CT angiogram, all unremarkable. Patient does have mild diastolic dysfunction, but is in no distress,  with no dyspnea, hypoxia, tachypnea or evidence of acute decompensation. Patient has followup scheduled for tomorrow with her physician. With today's reassuring findings, this seems appropriate.     Carmin Muskrat, MD 07/18/13 [phone]

## 2013-07-18 ENCOUNTER — Ambulatory Visit (HOSPITAL_COMMUNITY)
Admission: RE | Admit: 2013-07-18 | Discharge: 2013-07-18 | Disposition: A | Discharge status: Home / Self Care | Payer: Medicaid Other | Source: Ambulatory Visit | Attending: Adult Health | Admitting: Adult Health

## 2013-07-18 DIAGNOSIS — R160 Hepatomegaly, not elsewhere classified: Secondary | ICD-10-CM | POA: Insufficient documentation

## 2013-07-18 DIAGNOSIS — R109 Unspecified abdominal pain: Secondary | ICD-10-CM | POA: Insufficient documentation

## 2013-07-18 DIAGNOSIS — I5032 Chronic diastolic (congestive) heart failure: Secondary | ICD-10-CM

## 2013-07-18 MED ORDER — HYDROCODONE-ACETAMINOPHEN 5-325 MG PO TABS: 1.0000 | ORAL_TABLET | Freq: Four times a day (QID) | ORAL | Status: DC | PRN

## 2013-07-18 NOTE — ED Notes (Signed)
Discharge instructions and prescription given and reviewed with patient.  Patient verbalized understanding of sedating effects of medication and to return at 0945 in morning for [place].  Patient discharged home in good condition.

## 2013-07-18 NOTE — Discharge Instructions (Signed)
As discussed, your evaluation tonight has been largely reassuring.  It is very important to keep tomorrow's schedule followup appointment.  Return here for concerning changes in your condition should they occur in the interim.    Chest Pain (Nonspecific) Chest pain has many causes. Your pain could be caused by something serious, such as a heart attack or a blood clot in the lungs. It could also be caused by something less serious, such as a chest bruise or a virus. Follow up with your doctor. More lab tests or other studies may be needed to find the cause of your pain. Most of the time, nonspecific chest pain will improve within 2 to 3 days of rest and mild pain medicine. HOME CARE  For chest bruises, you may put ice on the sore area for 15-20 minutes, 03-04 times a day. Do this only if it makes you feel better.  Put ice in a plastic bag.  Place a towel between the skin and the bag.  Rest for the next 2 to 3 days.  Go back to work if the pain improves.  See your doctor if the pain lasts longer than 1 to 2 weeks.  Only take medicine as told by your doctor.  Quit smoking if you smoke. GET HELP RIGHT AWAY IF:   There is more pain or pain that spreads to the arm, neck, jaw, back, or belly (abdomen).  You have shortness of breath.  You cough more than usual or cough up blood.  You have very bad back or belly pain, feel sick to your stomach (nauseous), or throw up (vomit).  You have very bad weakness.  You pass out (faint).  You have a fever. Any of these problems may be serious and may be an emergency. Do not wait to see if the problems will go away. Get medical help right away. Call your local emergency services 911 in [place] Do not drive yourself to the hospital. MAKE SURE YOU:   Understand these instructions.  Will watch this condition.  Will get help right away if you or your child is not doing well or gets worse. Document Released: 09/21/2007 Document Revised:  06/27/2011 Document Reviewed: 09/21/2007 [facility] Patient Information 2014 [place], [place].

## 2013-07-19 ENCOUNTER — Ambulatory Visit (HOSPITAL_COMMUNITY): Payer: Medicaid Other

## 2013-07-22 ENCOUNTER — Other Ambulatory Visit (HOSPITAL_COMMUNITY): Payer: Medicaid Other

## 2013-07-24 ENCOUNTER — Other Ambulatory Visit (HOSPITAL_COMMUNITY): Payer: Self-pay | Admitting: Family Medicine

## 2013-07-24 DIAGNOSIS — R160 Hepatomegaly, not elsewhere classified: Secondary | ICD-10-CM

## 2013-07-30 ENCOUNTER — Ambulatory Visit (HOSPITAL_COMMUNITY)
Admission: RE | Admit: 2013-07-30 | Discharge: 2013-07-30 | Disposition: A | Discharge status: Home / Self Care | Payer: Medicaid Other | Source: Ambulatory Visit | Attending: Family Medicine | Admitting: Family Medicine

## 2013-07-30 DIAGNOSIS — R109 Unspecified abdominal pain: Secondary | ICD-10-CM | POA: Diagnosis present

## 2013-07-30 DIAGNOSIS — R160 Hepatomegaly, not elsewhere classified: Secondary | ICD-10-CM | POA: Diagnosis not present

## 2013-07-30 MED ORDER — GADOBENATE DIMEGLUMINE 529 MG/ML IV SOLN
20.0000 mL | Freq: Once | INTRAVENOUS | Status: AC | PRN
  Administered 2013-07-30: 20 mL via INTRAVENOUS

## 2013-08-09 NOTE — Progress Notes (Signed)
HPI: [name] is a 58 year old patient followed by Dr. Harl Bowie (yet to be est.) we are following for ongoing assessment and management of recurrent chest pain, hypertension, diastolic CHF. The patient had cardiac catheterization in May of 2014 demonstrating normal coronary arteries, but she was found to have diastolic dysfunction at that time.  She was seen last in March of 2015 with recurrent chest discomfort but much much better. Usually occurring with exertion. She was increased on HCTZ to 25 mg daily. 8 repeat the BMET and LFTs were ordered. Decided he hypertensive on the last office visit, but she has not been taking isosorbide and started. She states it is causing a headache. A followup ultrasound of her gallbladder was also ordered to to recurrent discomfort in her abdomen and chest with no frank positive Murphy sign.  Gallbladder ultrasound revealed: Enlarged liver with diffuse increased echogenicity, probably representing fatty change. A degree of underlying parenchymal disease cannot be excluded. No focal liver lesion is appreciated on this study. Note that a small mass noted in the right lobe of the liver posteriorly on prior CT is not seen by ultrasound. This degree of abnormal echogenicity could easily obscure focal lesions. Based on prior CT examination, if there is concern for potential liver pathology beyond that seen on this study, CT or MR pre and post-contrast to further evaluate the liver would be a reasonable consideration. MR would be the study of choice for optimal liver assessment if further evaluation is felt to be warranted.  The report was sent to primary care physician for further testing and recommendations. Lab work was normal, with no indication of elevated LFTs.  No Known Allergies  Current Outpatient Prescriptions  Medication Sig Dispense Refill  . atorvastatin (LIPITOR) 20 MG tablet Take 20 mg by mouth at bedtime.      . hydrochlorothiazide  (HYDRODIURIL) 25 MG tablet Take 1 tablet (25 mg total) by mouth daily.  30 tablet  6  . HYDROcodone-acetaminophen (NORCO/VICODIN) 5-325 MG per tablet Take 1 tablet by mouth every 6 (six) hours as needed.  15 tablet  0  . isosorbide mononitrate (IMDUR) 30 MG 24 hr tablet Take 1 tablet (30 mg total) by mouth daily.  30 tablet  0  . lisinopril (PRINIVIL,ZESTRIL) 10 MG tablet Take 10 mg by mouth daily.      . potassium chloride (K-DUR) 10 MEQ tablet Take 1 tablet (10 mEq total) by mouth daily.  30 tablet  11   No current facility-administered medications for this visit.    Past Medical History  Diagnosis Date  . Meningitis   . Hypertension   . Hyperlipidemia     Past Surgical History  Procedure Laterality Date  . Abdominal hysterectomy    . Thyroid surgery  2002    ROS: Review of systems complete and found to be negative unless listed above  PHYSICAL EXAM BP 123/78  Pulse 75  Ht 5\' 9"  (1.753 m)  Wt 214 lb (97.07 kg)  BMI 31.59 kg/m2 General: Well developed, well nourished, in no acute distress Head: Eyes PERRLA, No xanthomas.   Normal cephalic and atramatic  Lungs: Clear bilaterally to auscultation and percussion. Heart: HRRR S1 S2, without MRG.  Pulses are 2+ & equal.            No carotid bruit. No JVD.  No abdominal bruits. No femoral bruits. Abdomen: Bowel sounds are positive, abdomen soft and non-tender without masses or  Hernia's noted. Msk:  Back normal, normal gait. Normal strength and tone for age. Extremities: No clubbing, cyanosis or edema.  DP +1 Neuro: Alert and oriented X 3. Psych:  Good affect, responds appropriately   ASSESSMENT AND PLAN

## 2013-08-12 ENCOUNTER — Ambulatory Visit (INDEPENDENT_AMBULATORY_CARE_PROVIDER_SITE_OTHER): Payer: Medicaid Other | Admitting: Adult Health

## 2013-08-12 ENCOUNTER — Encounter: Payer: Self-pay | Admitting: Adult Health

## 2013-08-12 VITALS — BP 123/78 | HR 75 | Ht 69.0 in | Wt 214.0 lb

## 2013-08-12 DIAGNOSIS — I509 Heart failure, unspecified: Secondary | ICD-10-CM

## 2013-08-12 DIAGNOSIS — I5032 Chronic diastolic (congestive) heart failure: Secondary | ICD-10-CM

## 2013-08-12 MED ORDER — HYDROCHLOROTHIAZIDE 25 MG PO TABS
25.0000 mg | ORAL_TABLET | Freq: Every day | ORAL | Status: DC
Start: 2013-08-12 — End: 2013-12-19

## 2013-08-12 MED ORDER — ISOSORBIDE MONONITRATE ER 30 MG PO TB24: 30.0000 mg | ORAL_TABLET | Freq: Every day | ORAL | Status: DC

## 2013-08-12 MED ORDER — POTASSIUM CHLORIDE ER 10 MEQ PO TBCR: 10.0000 meq | EXTENDED_RELEASE_TABLET | Freq: Every day | ORAL | Status: DC

## 2013-08-12 MED ORDER — POTASSIUM CHLORIDE ER 10 MEQ PO CPCR: 10.0000 meq | ORAL_CAPSULE | Freq: Every day | ORAL | Status: DC

## 2013-08-12 MED ORDER — POTASSIUM CHLORIDE ER 10 MEQ PO CPCR: ORAL_CAPSULE | ORAL | Status: DC

## 2013-08-12 NOTE — Assessment & Plan Note (Signed)
Resolved

## 2013-08-12 NOTE — Patient Instructions (Addendum)
Your physician recommends that you schedule a follow-up appointment in: 6 months with Dr Virgina Jock will receive a reminder letter two months in advance reminding you to call and schedule your appointment. If you don't receive this letter, please contact our office.  Your physician has recommended you make the following change in your medication:   Take Micro K 10 mEq as needed

## 2013-08-12 NOTE — Progress Notes (Signed)
Name: Hannah Cooper    DOB: 04-18-1956  Age: 58 y.o.  MR#: [id number]       PCP:  Maggie Font, MD      Insurance: Payor: MEDICAID [place] / Plan: MEDICAID [place] ACCESS / Product Type: *No Product type* /   CC:    Chief Complaint  Patient presents with  . Congestive Heart Failure    VS Filed Vitals:   08/12/13 1401  BP: 123/78  Pulse: 75  Height: 5\' 9"  (1.753 m)  Weight: 214 lb (97.07 kg)    Weights Current Weight  08/12/13 214 lb (97.07 kg)  07/17/13 205 lb (92.987 kg)  07/12/13 208 lb (94.348 kg)    Blood Pressure  BP Readings from Last 3 Encounters:  08/12/13 123/78  07/17/13 136/66  07/12/13 156/82     Admit date:  (Not on file) Last encounter with RMR:  07/12/2013   Allergy Review of patient's allergies indicates no known allergies.  Current Outpatient Prescriptions  Medication Sig Dispense Refill  . atorvastatin (LIPITOR) 20 MG tablet Take 20 mg by mouth at bedtime.      . hydrochlorothiazide (HYDRODIURIL) 25 MG tablet Take 1 tablet (25 mg total) by mouth daily.  30 tablet  6  . HYDROcodone-acetaminophen (NORCO/VICODIN) 5-325 MG per tablet Take 1 tablet by mouth every 6 (six) hours as needed.  15 tablet  0  . isosorbide mononitrate (IMDUR) 30 MG 24 hr tablet Take 1 tablet (30 mg total) by mouth daily.  30 tablet  0  . lisinopril (PRINIVIL,ZESTRIL) 10 MG tablet Take 10 mg by mouth daily.      . potassium chloride (K-DUR) 10 MEQ tablet Take 1 tablet (10 mEq total) by mouth daily.  30 tablet  11   No current facility-administered medications for this visit.    Discontinued Meds:   There are no discontinued medications.  Patient Active Problem List   Diagnosis Date Noted  . Chronic diastolic CHF (congestive heart failure) 07/05/2013  . Lower extremity edema 07/05/2013  . Hyperlipidemia   . Chest pain 07/04/2013  . Hypokalemia 09/04/2012  . Precordial pain 09/04/2012  . Viral meningitis 01/22/2011  . Vomiting 01/22/2011  . PNEUMONIA, LEFT LOWER LOBE  10/10/2006  . DISEASE, ACUTE BRONCHOSPASM 10/10/2006  . HYPOTHYROIDISM 05/23/2006  . HYPERTENSION 05/23/2006  . OSTEOARTHRITIS 05/23/2006    LABS    Component Value Date/Time   NA 142 07/17/2013 2229   NA 139 07/12/2013 1558   NA 141 07/05/2013 0537   K 3.6* 07/17/2013 2229   K 4.1 07/12/2013 1558   K 3.9 07/05/2013 0537   CL 101 07/17/2013 2229   CL 103 07/12/2013 1558   CL 103 07/05/2013 0537   CO2 29 07/17/2013 2229   CO2 29 07/12/2013 1558   CO2 28 07/05/2013 0537   GLUCOSE 109* 07/17/2013 2229   GLUCOSE 100* 07/12/2013 1558   GLUCOSE 100* 07/05/2013 0537   BUN 12 07/17/2013 2229   BUN 13 07/12/2013 1558   BUN 11 07/05/2013 0537   CREATININE 0.80 07/17/2013 2229   CREATININE 0.85 07/12/2013 1558   CREATININE 0.79 07/05/2013 0537   CREATININE 0.78 07/04/2013 1948   CREATININE 0.81 09/20/2012 1412   CALCIUM 9.4 07/17/2013 2229   CALCIUM 9.5 07/12/2013 1558   CALCIUM 9.3 07/05/2013 0537   GFRNONAA 79* 07/17/2013 2229   GFRNONAA 89* 07/05/2013 0537   GFRNONAA 90* 07/04/2013 1948   GFRAA >90 07/17/2013 2229   GFRAA >90 07/05/2013 0537   GFRAA >90 07/04/2013  1948   CMP     Component Value Date/Time   NA 142 07/17/2013 2229   K 3.6* 07/17/2013 2229   CL 101 07/17/2013 2229   CO2 29 07/17/2013 2229   GLUCOSE 109* 07/17/2013 2229   BUN 12 07/17/2013 2229   CREATININE 0.80 07/17/2013 2229   CREATININE 0.85 07/12/2013 1558   CALCIUM 9.4 07/17/2013 2229   PROT 8.2 07/17/2013 2229   ALBUMIN 3.9 07/17/2013 2229   AST 20 07/17/2013 2229   ALT 22 07/17/2013 2229   ALKPHOS 73 07/17/2013 2229   BILITOT 0.2* 07/17/2013 2229   GFRNONAA 79* 07/17/2013 2229   GFRAA >90 07/17/2013 2229       Component Value Date/Time   WBC 3.7* 07/17/2013 2229   WBC 3.9* 07/04/2013 1948   WBC 3.3* 09/05/2012 0222   HGB 11.1* 07/17/2013 2229   HGB 11.2* 07/04/2013 1948   HGB 11.0* 09/05/2012 0222   HCT 33.0* 07/17/2013 2229   HCT 33.3* 07/04/2013 1948   HCT 32.4* 09/05/2012 0222   MCV 88.7 07/17/2013 2229   MCV 89.0 07/04/2013 1948   MCV 88.0 09/05/2012 0222     Lipid Panel     Component Value Date/Time   CHOL 102 07/05/2013 0537   TRIG 97 07/05/2013 0537   HDL 46 07/05/2013 0537   CHOLHDL 2.2 07/05/2013 0537   VLDL 19 07/05/2013 0537   LDLCALC 37 07/05/2013 0537    ABG No results found for this basename: phart, pco2, pco2art, po2, po2art, hco3, tco2, acidbasedef, o2sat     Lab Results  Component Value Date   TSH 4.995* 07/05/2013   BNP (last 3 results)  Recent Labs  07/04/13 1948 07/17/13 2318  PROBNP 19.6 36.2   Cardiac Panel (last 3 results) No results found for this basename: CKTOTAL, CKMB, TROPONINI, RELINDX,  in the last 72 hours  Iron/TIBC/Ferritin No results found for this basename: iron, tibc, ferritin     EKG Orders placed during the hospital encounter of 07/17/13  . EKG 12-LEAD  . EKG 12-LEAD  . ED EKG  . ED EKG  . EKG     Prior Assessment and Plan Problem List as of 08/12/2013     Cardiovascular and Mediastinum   HYPERTENSION   Last Assessment & Plan   07/12/2013 Office Visit Written 07/12/2013  2:52 PM by Jodelle Gross, NP     Blood pressure is elevated on this visit. She has been taking her isosorbide yet. She states this is giving her headache. I have advised her to take Tylenol every 6 hours when necessary headache and her body does become uses this medication. I have increased her HCTZ to 25 mg daily.    Chronic diastolic CHF (congestive heart failure)   Last Assessment & Plan   07/12/2013 Office Visit Written 07/12/2013  2:52 PM by Jodelle Gross, NP     No evidence of fluid overload currently. Her HCTZ was decreased to 12.5 mg and she is mildly hypertensive on this visit. Increase HCTZ to 25 mg daily. This will help with fluid retention. I will repeat a cemented to evaluate LFTs and kidney status. I have gone into detail concerning diastolic heart failure, our treatment of taking blood pressure under control and fluid retention down. She is advised on low sodium diet.      Respiratory   PNEUMONIA,  LEFT LOWER LOBE   DISEASE, ACUTE BRONCHOSPASM     Endocrine   HYPOTHYROIDISM   Last Assessment & Plan  09/20/2012 Office Visit Written 09/20/2012  2:15 PM by Lendon Colonel, NP     Recheck THS      Nervous and Auditory   Viral meningitis     Musculoskeletal and Integument   OSTEOARTHRITIS     Other   Vomiting   Hypokalemia   Last Assessment & Plan   11/12/2012 Office Visit Written 11/12/2012  4:46 PM by Lendon Colonel, NP     Recent labs demonstrate a potassium of 4.6.     Precordial pain   Last Assessment & Plan   09/20/2012 Office Visit Written 09/20/2012  2:16 PM by Lendon Colonel, NP     Resolved.    Chest pain   Last Assessment & Plan   07/12/2013 Office Visit Written 07/12/2013  2:54 PM by Lendon Colonel, NP     Ultrasound completed of her gallbladder to rule out cholelithiasis. She is mildly tender in that area although no frank positive Murphy sign. She does have some abdominal discomfort in the lower gastric area after palpation of the gallbladder. She may need further GI workup should this become necessary as she is been ruled out for CAD from our standpoint.    Hyperlipidemia   Lower extremity edema       Imaging: Mr Abdomen W Wo Contrast  07/30/2013   CLINICAL DATA:  Upper abdominal pain with hepatomegaly on ultrasound.  EXAM: MRI ABDOMEN WITHOUT AND WITH CONTRAST  TECHNIQUE: Multiplanar multisequence MR imaging of the abdomen was performed both before and after the administration of intravenous contrast.  CONTRAST:  5mL MULTIHANCE GADOBENATE DIMEGLUMINE 529 MG/ML IV SOLN  COMPARISON:  Right upper quadrant abdominal ultrasound 07/18/2013. Abdominal CT 05/15/2005.  FINDINGS: The liver is mildly enlarged, measuring 21.6 cm transverse and 18.4 cm cephalocaudad. There is less than 15% loss of signal on the gradient echo opposed phase images. No focal lesions are identified. Specifically, there is no abnormal enhancement within the liver or focal abnormality to  correspond with a small low-density lesion on the CT from more than 8 years ago.  The gallbladder, biliary system, pancreas and spleen appear normal. There are tiny renal cortical cysts. No suspicious renal lesion or hydronephrosis is demonstrated. There is no adrenal mass.  Left periaortic node measures 6 mm short axis, improved from the prior CT. Small lymph nodes within the porta hepatis are not pathologically enlarged. There is no ascites.  IMPRESSION: 1. Mild nonspecific hepatomegaly. 2. No evidence of significant steatosis or focal hepatic abnormality. 3. No acute findings demonstrated.   Electronically Signed   By: Camie Patience M.D.   On: 07/30/2013 11:43   Dg Chest Portable 1 View  07/17/2013   CLINICAL DATA:  2 hr history of chest pain  EXAM: PORTABLE CHEST - 1 VIEW  COMPARISON:  Recent prior chest x-ray and CT PE study 07/04/2013  FINDINGS: Slightly lower inspiratory volumes with minimal bibasilar atelectasis. The lungs are clear and negative for focal airspace consolidation, pulmonary edema or suspicious pulmonary nodule. No pleural effusion or pneumothorax. Cardiac and mediastinal contours are within normal limits. No acute fracture or lytic or blastic osseous lesions. The visualized upper abdominal bowel gas pattern is unremarkable.  IMPRESSION: No active cardiopulmonary disease.   Electronically Signed   By: Jacqulynn Cadet M.D.   On: 07/17/2013 22:36   US Abdomen Limited Ruq  07/18/2013   CLINICAL DATA:  Upper abdominal pain  EXAM: US ABDOMEN LIMITED - RIGHT UPPER QUADRANT  COMPARISON:  CT abdomen and pelvis  May 15, 2005  FINDINGS: Gallbladder:  No gallstones or wall thickening visualized. There is no pericholecystic fluid. No sonographic Murphy sign noted.  Common bile duct:  Diameter: 4 mm. There is no intrahepatic or extrahepatic biliary duct dilatation.  Liver:  Liver is enlarged, measuring approximately 20 cm in length. The echotexture of the liver is diffusely increased. No focal liver  lesions are appreciable.  IMPRESSION: Enlarged liver with diffuse increased echogenicity, probably representing fatty change. A degree of underlying parenchymal disease cannot be excluded. No focal liver lesion is appreciated on this study. Note that a small mass noted in the right lobe of the liver posteriorly on prior CT is not seen by ultrasound. This degree of abnormal echogenicity could easily obscure focal lesions. Based on prior CT examination, if there is concern for potential liver pathology beyond that seen on this study, CT or MR pre and post-contrast to further evaluate the liver would be a reasonable consideration. MR would be the study of choice for optimal liver assessment if further evaluation is felt to be warranted.  Study otherwise unremarkable.   Electronically Signed   By: Lowella Grip M.D.   On: 07/18/2013 10:16

## 2013-08-12 NOTE — Assessment & Plan Note (Signed)
Blood pressure is well controlled. She is advised to continue to take medications as directed. She is also to avoid salt.

## 2013-08-12 NOTE — Assessment & Plan Note (Signed)
She states that she has had to take an extra dose of HCTZ for fluid retention over the last week. She is still eating some salty foods. She is reminded to be more mindful of salt intake.  She is planning on starting a walking program as she is unhappy with weight gain. I have encouraged her to do this. Breathing is still a problem for her. She is recommended to see PCP for PFT's evaluation.

## 2013-08-17 ENCOUNTER — Encounter (HOSPITAL_COMMUNITY): Payer: Self-pay | Admitting: Emergency Medicine

## 2013-08-17 ENCOUNTER — Emergency Department (HOSPITAL_COMMUNITY)
Admission: EM | Admit: 2013-08-17 | Discharge: 2013-08-17 | Disposition: A | Discharge status: Home / Self Care | Payer: Medicaid Other | Attending: Emergency Medicine | Admitting: Emergency Medicine

## 2013-08-17 DIAGNOSIS — R05 Cough: Secondary | ICD-10-CM | POA: Insufficient documentation

## 2013-08-17 DIAGNOSIS — I10 Essential (primary) hypertension: Secondary | ICD-10-CM | POA: Insufficient documentation

## 2013-08-17 DIAGNOSIS — I5032 Chronic diastolic (congestive) heart failure: Secondary | ICD-10-CM | POA: Insufficient documentation

## 2013-08-17 DIAGNOSIS — Z79899 Other long term (current) drug therapy: Secondary | ICD-10-CM | POA: Insufficient documentation

## 2013-08-17 DIAGNOSIS — R059 Cough, unspecified: Secondary | ICD-10-CM | POA: Insufficient documentation

## 2013-08-17 DIAGNOSIS — Z8661 Personal history of infections of the central nervous system: Secondary | ICD-10-CM | POA: Insufficient documentation

## 2013-08-17 DIAGNOSIS — F4321 Adjustment disorder with depressed mood: Secondary | ICD-10-CM | POA: Insufficient documentation

## 2013-08-17 DIAGNOSIS — E785 Hyperlipidemia, unspecified: Secondary | ICD-10-CM | POA: Insufficient documentation

## 2013-08-17 DIAGNOSIS — R079 Chest pain, unspecified: Secondary | ICD-10-CM | POA: Insufficient documentation

## 2013-08-17 DIAGNOSIS — R51 Headache: Secondary | ICD-10-CM | POA: Insufficient documentation

## 2013-08-17 LAB — CBC WITH DIFFERENTIAL/PLATELET
Basophils Absolute: 0 10*3/uL (ref 0.0–0.1)
Basophils Relative: 1 % (ref 0–1)
EOS ABS: 0.1 10*3/uL (ref 0.0–0.7)
EOS PCT: 2 % (ref 0–5)
HEMATOCRIT: 35.4 % — AB (ref 36.0–46.0)
Hemoglobin: 12.2 g/dL (ref 12.0–15.0)
LYMPHS ABS: 1 10*3/uL (ref 0.7–4.0)
LYMPHS PCT: 29 % (ref 12–46)
MCH: 30.4 pg (ref 26.0–34.0)
MCHC: 34.5 g/dL (ref 30.0–36.0)
MCV: 88.3 fL (ref 78.0–100.0)
MONO ABS: 0.3 10*3/uL (ref 0.1–1.0)
MONOS PCT: 8 % (ref 3–12)
Neutro Abs: 2.1 10*3/uL (ref 1.7–7.7)
Neutrophils Relative %: 60 % (ref 43–77)
Platelets: 271 10*3/uL (ref 150–400)
RBC: 4.01 MIL/uL (ref 3.87–5.11)
RDW: 13.9 % (ref 11.5–15.5)
WBC: 3.4 10*3/uL — AB (ref 4.0–10.5)

## 2013-08-17 LAB — BASIC METABOLIC PANEL
BUN: 10 mg/dL (ref 6–23)
CALCIUM: 9.8 mg/dL (ref 8.4–10.5)
CO2: 29 meq/L (ref 19–32)
CREATININE: 0.8 mg/dL (ref 0.50–1.10)
Chloride: 100 mEq/L (ref 96–112)
GFR calc Af Amer: >90 mL/min (ref >90)
GFR, EST NON AFRICAN AMERICAN: 79 mL/min — AB (ref >90)
GLUCOSE: 115 mg/dL — AB (ref 70–99)
Potassium: 3.5 mEq/L — ABNORMAL LOW (ref 3.7–5.3)
Sodium: 140 mEq/L (ref 137–147)

## 2013-08-17 LAB — PRO B NATRIURETIC PEPTIDE: Pro B Natriuretic peptide (BNP): 106.6 pg/mL (ref 0–125)

## 2013-08-17 LAB — TROPONIN I: Troponin I: <0.3 ng/mL (ref <0.30)

## 2013-08-17 MED ORDER — LORAZEPAM 2 MG/ML IJ SOLN
1.0000 mg | Freq: Once | INTRAMUSCULAR | Status: AC
  Administered 2013-08-17: 1 mg via INTRAVENOUS
  Filled 2013-08-17: qty 1

## 2013-08-17 MED ORDER — NITROGLYCERIN 0.4 MG SL SUBL
0.4000 mg | SUBLINGUAL_TABLET | SUBLINGUAL | Status: AC | PRN
  Administered 2013-08-17 (×3): 0.4 mg via SUBLINGUAL
  Filled 2013-08-17: qty 1

## 2013-08-17 MED ORDER — ACETAMINOPHEN 325 MG PO TABS
650.0000 mg | ORAL_TABLET | Freq: Once | ORAL | Status: AC
  Administered 2013-08-17: 650 mg via ORAL
  Filled 2013-08-17: qty 2

## 2013-08-17 MED ORDER — ASPIRIN 81 MG PO CHEW
324.0000 mg | CHEWABLE_TABLET | Freq: Once | ORAL | Status: AC
  Administered 2013-08-17: 324 mg via ORAL
  Filled 2013-08-17: qty 4

## 2013-08-17 MED ORDER — MORPHINE SULFATE 2 MG/ML IJ SOLN
2.0000 mg | Freq: Once | INTRAMUSCULAR | Status: AC
Start: 2013-08-17 — End: 2013-08-17
  Administered 2013-08-17: 2 mg via INTRAVENOUS
  Filled 2013-08-17: qty 1

## 2013-08-17 NOTE — ED Notes (Signed)
Pt trying to calm down and says she is feeling better.

## 2013-08-17 NOTE — ED Provider Notes (Signed)
CSN: [id number]     Arrival date & time 08/17/13  1048 History  This chart was scribed for Orpah Greek, MD by Roxan Diesel, ED scribe.  This patient was seen in room APA03/APA03 and the patient's care was started at 11:09 AM.   Chief Complaint  Patient presents with  . Chest Pain    The history is provided by the patient. No language interpreter was used.    HPI Comments: Hannah Cooper is a 58 y.o. female with h/o CHF, HTN and hyperlipidemia who presents to the Emergency Department complaining of chest pain that began earlier this morning.  Pt initially came to the ED this morning to be with her sister, who was brought in via EMS and died approximately one hour ago.  She states her pain initially began this morning while she was at her sister's house, watching her sister who had been hemorrhaging from her tracheostomy tube and was unresponsive while waiting for EMS.  Her pain improved somewhat after she arrived to the ED but worsened again after her sister died.  She describes pain as a "heaviness and squeezing" in the center of her chest.  She also began coughing up "frothy" sputum.  In addition she complains of a headache.  Currently pt states she is feeling much better and she has no chest discomfort currently.  Pt admits to h/o CHF but denies h/o MI.     Patient Active Problem List   Diagnosis Date Noted  . Chronic diastolic CHF (congestive heart failure) 07/05/2013  . Lower extremity edema 07/05/2013  . Hyperlipidemia   . Chest pain 07/04/2013  . Hypokalemia 09/04/2012  . Precordial pain 09/04/2012  . Viral meningitis 01/22/2011  . Vomiting 01/22/2011  . PNEUMONIA, LEFT LOWER LOBE 10/10/2006  . DISEASE, ACUTE BRONCHOSPASM 10/10/2006  . HYPOTHYROIDISM 05/23/2006  . HYPERTENSION 05/23/2006  . OSTEOARTHRITIS 05/23/2006    Past Medical History  Diagnosis Date  . Meningitis   . Hypertension   . Hyperlipidemia     Past Surgical History  Procedure Laterality  Date  . Abdominal hysterectomy    . Thyroid surgery  2002    Family History  Problem Relation Age of Onset  . Heart attack Mother 69  . Heart attack Sister 87    History  Substance Use Topics  . Smoking status: Never Smoker   . Smokeless tobacco: Never Used  . Alcohol Use: No    OB History   Grav Para Term Preterm Abortions TAB SAB Ect Mult Living            0       Review of Systems  Respiratory: Positive for cough.   Cardiovascular: Positive for chest pain.  Neurological: Positive for headaches.  All other systems reviewed and are negative.     Allergies  Other  Home Medications   Prior to Admission medications   Medication Sig Start Date End Date Taking? Authorizing Provider  atorvastatin (LIPITOR) 20 MG tablet Take 20 mg by mouth at bedtime.    Historical Provider, MD  hydrochlorothiazide (HYDRODIURIL) 25 MG tablet Take 1 tablet (25 mg total) by mouth daily. 08/12/13   Lendon Colonel, NP  HYDROcodone-acetaminophen (NORCO/VICODIN) 5-325 MG per tablet Take 1 tablet by mouth every 6 (six) hours as needed. 07/18/13   Carmin Muskrat, MD  isosorbide mononitrate (IMDUR) 30 MG 24 hr tablet Take 1 tablet (30 mg total) by mouth daily. 08/12/13   Lendon Colonel, NP  lisinopril (PRINIVIL,ZESTRIL) 10 MG  tablet Take 10 mg by mouth daily.    Historical Provider, MD  potassium chloride (K-DUR) 10 MEQ tablet Take 1 tablet (10 mEq total) by mouth daily. 08/12/13   Lendon Colonel, NP  potassium chloride (MICRO-K) 10 MEQ CR capsule Take one tablet by mouth as needed 08/12/13   Lendon Colonel, NP   BP 136/97  Pulse 83  Temp(Src) 98.5 F (36.9 C) (Oral)  Resp 21  Ht 5\' 9"  (1.753 m)  Wt 210 lb (95.255 kg)  BMI 31.00 kg/m2  SpO2 98%  Physical Exam  Nursing note and vitals reviewed. Constitutional: She is oriented to person, place, and time. She appears well-developed and well-nourished. No distress.  HENT:  Head: Normocephalic and atraumatic.  Right Ear: Hearing  normal.  Left Ear: Hearing normal.  Nose: Nose normal.  Mouth/Throat: Oropharynx is clear and moist and mucous membranes are normal.  Eyes: Conjunctivae and EOM are normal. Pupils are equal, round, and reactive to light.  Neck: Normal range of motion. Neck supple.  Cardiovascular: Normal rate, regular rhythm, S1 normal and S2 normal.  Exam reveals no gallop and no friction rub.   No murmur heard. Pulmonary/Chest: Effort normal and breath sounds normal. No respiratory distress. She has no wheezes. She has no rales. She exhibits no tenderness.  Abdominal: Soft. Normal appearance and bowel sounds are normal. There is no hepatosplenomegaly. There is no tenderness. There is no rebound, no guarding, no tenderness at McBurney's point and negative Murphy's sign. No hernia.  Musculoskeletal: Normal range of motion.  Neurological: She is alert and oriented to person, place, and time. She has normal strength. No cranial nerve deficit or sensory deficit. Coordination normal. GCS eye subscore is 4. GCS verbal subscore is 5. GCS motor subscore is 6.  Skin: Skin is warm, dry and intact. No rash noted. No cyanosis.  Psychiatric: She has a normal mood and affect. Her speech is normal and behavior is normal. Thought content normal.    ED Course  Procedures (including critical care time)  DIAGNOSTIC STUDIES: Oxygen Saturation is 98% on room air, normal by my interpretation.    COORDINATION OF CARE: 11:15 AM-Discussed treatment plan which includes Ativan, EKG, and labs with pt at bedside and pt agreed to plan.     Labs Review Labs Reviewed  CBC WITH DIFFERENTIAL - Abnormal; Notable for the following:    WBC 3.4 (*)    HCT 35.4 (*)    All other components within normal limits  BASIC METABOLIC PANEL - Abnormal; Notable for the following:    Potassium 3.5 (*)    Glucose, Bld 115 (*)    GFR calc non Af Amer 79 (*)    All other components within normal limits  PRO B NATRIURETIC PEPTIDE  TROPONIN I     Imaging Review No results found.   EKG Interpretation None      Date: 08/17/2013  Rate: 67  Rhythm: normal sinus rhythm  QRS Axis: right  Intervals: normal  ST/T Wave abnormalities: nonspecific T wave changes  Conduction Disutrbances:none and first-degree A-V block   Narrative Interpretation:   Old EKG Reviewed: unchanged    MDM   Final diagnoses:  Chest pain  Grief reaction    Patient presents to the ER for evaluation of chest pain. Patient reports that the pain began while the ambulance was that a family member's house. The family member died and this caused a great of stress for the patient. She was here in the ER after  the family member and was transported to ER and declared dead when she developed further chest pain.  Patient did not have any response to nitroglycerin. Her symptoms were atypical and felt to be likely secondary to stress. Review her records reveals that she has had noncardiac chest pain in the past and underwent cardiac catheterization less than one year ago. She had normal coronary arteries and normal ejection fraction at that time. She does take Imdur, is not clear why she is taking this. She has had difficult to control blood pressures in the past resulting in LVH, and it is felt that the ventricular for treatment of her hypertension.  As she has coronary arteries that are known to be widely patent without obstruction, symptoms are atypical and likely secondary to grief reaction, is felt that the patient is here for discharge he'll follow up with primary doctor. She was counseled to return if her symptoms worsen.    I personally performed the services described in this documentation, which was scribed in my presence. The recorded information has been reviewed and is accurate.    Orpah Greek, MD 08/17/13 [phone]

## 2013-08-17 NOTE — Discharge Instructions (Signed)
Chest Pain (Nonspecific) Chest pain has many causes. Your pain could be caused by something serious, such as a heart attack or a blood clot in the lungs. It could also be caused by something less serious, such as a chest bruise or a virus. Follow up with your doctor. More lab tests or other studies may be needed to find the cause of your pain. Most of the time, nonspecific chest pain will improve within 2 to 3 days of rest and mild pain medicine. HOME CARE  For chest bruises, you may put ice on the sore area for 15-20 minutes, 03-04 times a day. Do this only if it makes you feel better.  Put ice in a plastic bag.  Place a towel between the skin and the bag.  Rest for the next 2 to 3 days.  Go back to work if the pain improves.  See your doctor if the pain lasts longer than 1 to 2 weeks.  Only take medicine as told by your doctor.  Quit smoking if you smoke. GET HELP RIGHT AWAY IF:   There is more pain or pain that spreads to the arm, neck, jaw, back, or belly (abdomen).  You have shortness of breath.  You cough more than usual or cough up blood.  You have very bad back or belly pain, feel sick to your stomach (nauseous), or throw up (vomit).  You have very bad weakness.  You pass out (faint).  You have a fever. Any of these problems may be serious and may be an emergency. Do not wait to see if the problems will go away. Get medical help right away. Call your local emergency services 911 in [place] Do not drive yourself to the hospital. MAKE SURE YOU:   Understand these instructions.  Will watch this condition.  Will get help right away if you or your child is not doing well or gets worse. Document Released: 09/21/2007 Document Revised: 06/27/2011 Document Reviewed: 09/21/2007 [facility] Patient Information 2014 [place], [place].  Grief Reaction Grief is a normal response to the death of someone close to you. Feelings of fear, anger, and guilt can affect almost everyone who  loses someone they love. Symptoms of depression are also common. These include problems with sleep, loss of appetite, and lack of energy. These grief reaction symptoms often last for weeks to months after a loss. They may also return during special times that remind you of the person you lost, such as an anniversary or birthday. Anxiety, insomnia, irritability, and deep depression may last beyond the period of normal grief. If you experience these feelings for 6 months or longer, you may have clinical depression. Clinical depression requires further medical attention. If you think that you have clinical depression, you should contact your caregiver. If you have a history of depression and or a family history of depression, you are at greater risk of clinical depression. You are also at greater risk of developing clinical depression if the loss was traumatic or the loss was of someone with whom you had unresolved issues.  A grief reaction can become complicated by being blocked. This means being unable to cry or express extreme emotions. This may prolong the grieving period and worsen the emotional effects of the loss. Mourning is a natural event in human life. A healthy grief reaction is one that is not blocked . It requires a time of sadness and readjustment.It is very important to share your sorrow and fear with others, especially close friends  and family. Professional counselors and clergy can also help you process your grief. Document Released: 04/04/2005 Document Revised: 06/27/2011 Document Reviewed: 12/13/2005 [facility] Patient Information 2014 [place], [place].

## 2013-08-17 NOTE — ED Notes (Signed)
Pt's sister just passed and pt was in ED with pt.  Pt became very upset and started having chest pain and coughing up "frothy sputum."

## 2013-09-08 ENCOUNTER — Other Ambulatory Visit: Payer: Self-pay | Admitting: Adult Health

## 2013-09-30 ENCOUNTER — Other Ambulatory Visit (HOSPITAL_COMMUNITY): Payer: Self-pay | Admitting: Adult Health

## 2013-09-30 NOTE — Telephone Encounter (Signed)
Pt is calling for lisinopril refill. Marland Kitchen

## 2013-10-18 ENCOUNTER — Other Ambulatory Visit: Payer: Self-pay | Admitting: Adult Health

## 2013-11-04 ENCOUNTER — Other Ambulatory Visit (HOSPITAL_COMMUNITY): Payer: Self-pay | Admitting: Physician Assistant

## 2013-11-15 ENCOUNTER — Emergency Department (HOSPITAL_COMMUNITY)
Admission: EM | Admit: 2013-11-15 | Discharge: 2013-11-16 | Disposition: A | Discharge status: Home / Self Care | Payer: Medicaid Other | Attending: Emergency Medicine | Admitting: Emergency Medicine

## 2013-11-15 ENCOUNTER — Emergency Department (HOSPITAL_COMMUNITY): Payer: Medicaid Other

## 2013-11-15 ENCOUNTER — Encounter (HOSPITAL_COMMUNITY): Payer: Self-pay | Admitting: Emergency Medicine

## 2013-11-15 DIAGNOSIS — R51 Headache: Secondary | ICD-10-CM | POA: Diagnosis present

## 2013-11-15 DIAGNOSIS — H209 Unspecified iridocyclitis: Secondary | ICD-10-CM | POA: Diagnosis not present

## 2013-11-15 DIAGNOSIS — I10 Essential (primary) hypertension: Secondary | ICD-10-CM | POA: Insufficient documentation

## 2013-11-15 DIAGNOSIS — Z79899 Other long term (current) drug therapy: Secondary | ICD-10-CM | POA: Insufficient documentation

## 2013-11-15 DIAGNOSIS — E785 Hyperlipidemia, unspecified: Secondary | ICD-10-CM | POA: Insufficient documentation

## 2013-11-15 LAB — CBC WITH DIFFERENTIAL/PLATELET
BASOS PCT: 0 % (ref 0–1)
Basophils Absolute: 0 10*3/uL (ref 0.0–0.1)
EOS PCT: 5 % (ref 0–5)
Eosinophils Absolute: 0.2 10*3/uL (ref 0.0–0.7)
HEMATOCRIT: 34.7 % — AB (ref 36.0–46.0)
Hemoglobin: 11.8 g/dL — ABNORMAL LOW (ref 12.0–15.0)
Lymphocytes Relative: 45 % (ref 12–46)
Lymphs Abs: 1.6 10*3/uL (ref 0.7–4.0)
MCH: 30 pg (ref 26.0–34.0)
MCHC: 34 g/dL (ref 30.0–36.0)
MCV: 88.3 fL (ref 78.0–100.0)
MONO ABS: 0.3 10*3/uL (ref 0.1–1.0)
Monocytes Relative: 8 % (ref 3–12)
Neutro Abs: 1.5 10*3/uL — ABNORMAL LOW (ref 1.7–7.7)
Neutrophils Relative %: 42 % — ABNORMAL LOW (ref 43–77)
Platelets: 259 10*3/uL (ref 150–400)
RBC: 3.93 MIL/uL (ref 3.87–5.11)
RDW: 14 % (ref 11.5–15.5)
WBC: 3.6 10*3/uL — ABNORMAL LOW (ref 4.0–10.5)

## 2013-11-15 LAB — SEDIMENTATION RATE: Sed Rate: 40 mm/hr — ABNORMAL HIGH (ref 0–22)

## 2013-11-15 LAB — COMPREHENSIVE METABOLIC PANEL
ALBUMIN: 4 g/dL (ref 3.5–5.2)
ALT: 40 U/L — ABNORMAL HIGH (ref 0–35)
ANION GAP: 10 (ref 5–15)
AST: 30 U/L (ref 0–37)
Alkaline Phosphatase: 80 U/L (ref 39–117)
BILIRUBIN TOTAL: 0.2 mg/dL — AB (ref 0.3–1.2)
BUN: 12 mg/dL (ref 6–23)
CHLORIDE: 102 meq/L (ref 96–112)
CO2: 30 meq/L (ref 19–32)
CREATININE: 0.8 mg/dL (ref 0.50–1.10)
Calcium: 9.6 mg/dL (ref 8.4–10.5)
GFR calc Af Amer: >90 mL/min (ref >90)
GFR, EST NON AFRICAN AMERICAN: 79 mL/min — AB (ref >90)
Glucose, Bld: 114 mg/dL — ABNORMAL HIGH (ref 70–99)
Potassium: 3.8 mEq/L (ref 3.7–5.3)
Sodium: 142 mEq/L (ref 137–147)
Total Protein: 7.9 g/dL (ref 6.0–8.3)

## 2013-11-15 MED ORDER — TETRACAINE HCL 0.5 % OP SOLN
2.0000 [drp] | Freq: Once | OPHTHALMIC | Status: AC
  Administered 2013-11-15: 2 [drp] via OPHTHALMIC
  Filled 2013-11-15: qty 2

## 2013-11-15 MED ORDER — CYCLOPENTOLATE HCL 1 % OP SOLN: 1.0000 [drp] | Freq: Two times a day (BID) | OPHTHALMIC | Status: AC

## 2013-11-15 MED ORDER — METOCLOPRAMIDE HCL 5 MG/ML IJ SOLN
10.0000 mg | Freq: Once | INTRAMUSCULAR | Status: AC
  Administered 2013-11-15: 10 mg via INTRAVENOUS
  Filled 2013-11-15: qty 2

## 2013-11-15 MED ORDER — DIPHENHYDRAMINE HCL 50 MG/ML IJ SOLN
25.0000 mg | Freq: Once | INTRAMUSCULAR | Status: AC
  Administered 2013-11-15: 25 mg via INTRAVENOUS
  Filled 2013-11-15: qty 1

## 2013-11-15 MED ORDER — FLUORESCEIN SODIUM 1 MG OP STRP
1.0000 | ORAL_STRIP | Freq: Once | OPHTHALMIC | Status: AC
  Administered 2013-11-15: 1 via OPHTHALMIC
  Filled 2013-11-15: qty 1

## 2013-11-15 MED ORDER — PREDNISOLONE ACETATE 1 % OP SUSP: 1.0000 [drp] | Freq: Four times a day (QID) | OPHTHALMIC | Status: AC

## 2013-11-15 MED ORDER — KETOROLAC TROMETHAMINE 0.5 % OP SOLN: 1.0000 [drp] | Freq: Four times a day (QID) | OPHTHALMIC | Status: AC | PRN

## 2013-11-15 MED ORDER — KETOROLAC TROMETHAMINE 30 MG/ML IJ SOLN
30.0000 mg | Freq: Once | INTRAMUSCULAR | Status: AC
  Administered 2013-11-15: 30 mg via INTRAVENOUS
  Filled 2013-11-15: qty 1

## 2013-11-15 NOTE — ED Provider Notes (Signed)
CSN: [id number]     Arrival date & time 11/15/13  2027 History   First MD Initiated Contact with Patient 11/15/13 2045    This chart was scribed for Ezequiel Essex, MD by Terressa Koyanagi, ED Scribe. This patient was seen in room APA18/APA18 and the patient's care was started at 8:46 PM.  Chief Complaint  Patient presents with  . Headache   PCP: Maggie Font, MD  Patient is a 58 y.o. female presenting with headaches. The history is provided by the patient.  Headache  HPI Comments: Hannah Cooper is a 58 y.o. female who presents to the Emergency Department complaining of an intermittent HA onset one week ago. Pt specifies that until last night she had HAs everyday for the past week that subsided as the day went on. Pt notes that over the past week she often woke up with a HA. Starting last night, however, she began to have a constant HA with associated blurred vision, eye pain, light sensitivity, dizziness and SOB.  In response to her Sx, pt took an extra dose of her BP medication.Pt states she currently has a HA; she describes the pain as an aching pain and rates it a 7 out of 10. Pt reports that while she has had similar HAs in the past, historically her eyes never hurt. Headache is gradual in onset. Denies thunderclap onset. Pt states she does not wear contacts and only wears glasses to read. Pt denies vomiting, seeing spots, fever, noise sensitivity, cough, chest pain, sick contacts, double vision, Hx of glaucoma.   Past Medical History  Diagnosis Date  . Meningitis   . Hypertension   . Hyperlipidemia    Past Surgical History  Procedure Laterality Date  . Abdominal hysterectomy    . Thyroid surgery  2002   Family History  Problem Relation Age of Onset  . Heart attack Mother 34  . Heart attack Sister 52   History  Substance Use Topics  . Smoking status: Never Smoker   . Smokeless tobacco: Never Used  . Alcohol Use: No   OB History   Grav Para Term Preterm Abortions TAB SAB  Ect Mult Living            0     Review of Systems  Neurological: Positive for headaches.    A complete 10 system review of systems was obtained and all systems are negative except as noted in the HPI and PMH.    Allergies  Other  Home Medications   Prior to Admission medications   Medication Sig Start Date End Date Taking? Authorizing Provider  atorvastatin (LIPITOR) 20 MG tablet Take 20 mg by mouth at bedtime.   Yes Historical Provider, MD  cycloSPORINE (RESTASIS) 0.05 % ophthalmic emulsion Place 1 drop into both eyes 2 (two) times daily.   Yes Historical Provider, MD  hydrochlorothiazide (HYDRODIURIL) 25 MG tablet Take 1 tablet (25 mg total) by mouth daily. 08/12/13  Yes Lendon Colonel, NP  isosorbide mononitrate (IMDUR) 30 MG 24 hr tablet Take 1 tablet (30 mg total) by mouth daily. 08/12/13  Yes Lendon Colonel, NP  lisinopril (PRINIVIL,ZESTRIL) 10 MG tablet Take 10 mg by mouth daily.   Yes Historical Provider, MD  potassium chloride (K-DUR) 10 MEQ tablet Take 10 mEq by mouth daily.   Yes Historical Provider, MD  cyclopentolate (CYCLOGYL) 1 % ophthalmic solution Place 1 drop into the left eye 2 (two) times daily. 11/15/13 11/18/13  Ezequiel Essex, MD  ketorolac Nancie Neas)  0.5 % ophthalmic solution Place 1 drop into the left eye 4 (four) times daily as needed. 11/15/13 11/18/13  Ezequiel Essex, MD  prednisoLONE acetate (PRED FORTE) 1 % ophthalmic suspension Place 1 drop into the left eye 4 (four) times daily. 11/15/13 11/18/13  Ezequiel Essex, MD   Triage Vitals: BP 144/81  Pulse 69  Temp(Src) 98.7 F (37.1 C) (Oral)  Resp 16  Ht _0  (1.702 m)  Wt 195 lb (88.451 kg)  BMI 30.53 kg/m2  SpO2 98% Physical Exam  Nursing note and vitals reviewed. Constitutional: She is oriented to person, place, and time. She appears well-developed and well-nourished. No distress.  HENT:  Head: Normocephalic and atraumatic.  Mouth/Throat: Oropharynx is clear and moist. No oropharyngeal exudate.   No temporal artery tenderness. No meningismus.   Eyes: EOM are normal. Pupils are equal, round, and reactive to light. Lids are everted and swept, no foreign bodies found. No foreign body present in the left eye. Right conjunctiva is not injected. Left conjunctiva is injected. Left eye exhibits normal extraocular motion.  Slit lamp exam:      The left eye shows no corneal abrasion, no corneal flare, no foreign body, no hyphema, no hypopyon, no fluorescein uptake and no anterior chamber bulge.  Left eye diffused conjunctival and scleral injunction. Pupils are equal and reactive. Extraocular movements are intact.   No fluorescein uptake. Seidel's negative.  Intraoocular pressure is 11 on the left.   Neck: Normal range of motion. Neck supple.  No meningismus.  Cardiovascular: Normal rate, regular rhythm, normal heart sounds and intact distal pulses.   No murmur heard. Pulmonary/Chest: Effort normal and breath sounds normal. No respiratory distress.  Abdominal: Soft. There is no tenderness. There is no rebound and no guarding.  Musculoskeletal: Normal range of motion. She exhibits no edema and no tenderness.  Neurological: She is alert and oriented to person, place, and time. No cranial nerve deficit. She exhibits normal muscle tone. Coordination normal.  No ataxia on finger to nose bilaterally. No pronator drift. 5/5 strength throughout. CN 2-12 intact. Negative Romberg. Equal grip strength. Sensation intact. Gait is normal.   Skin: Skin is warm.  Psychiatric: She has a normal mood and affect. Her behavior is normal.    ED Course  Procedures (including critical care time) DIAGNOSTIC STUDIES: Oxygen Saturation is 98% on RA, nl by my interpretation.    COORDINATION OF CARE: 8:57PM: Eye exam and tests, Fluorescein Eye Stain test, performed with pt's consent. 10:25 PM: Tono-Pen used to measure IOP.    8:55 PM-Discussed treatment plan which includes imaging, tonupen, and fluorescein eye stain  test  with pt at bedside. Patient verbalizes understanding and agrees with treatment plan.  Labs Review Labs Reviewed  CBC WITH DIFFERENTIAL - Abnormal; Notable for the following:    WBC 3.6 (*)    Hemoglobin 11.8 (*)    HCT 34.7 (*)    Neutrophils Relative % 42 (*)    Neutro Abs 1.5 (*)    All other components within normal limits  COMPREHENSIVE METABOLIC PANEL - Abnormal; Notable for the following:    Glucose, Bld 114 (*)    ALT 40 (*)    Total Bilirubin 0.2 (*)    GFR calc non Af Amer 79 (*)    All other components within normal limits  SEDIMENTATION RATE - Abnormal; Notable for the following:    Sed Rate 40 (*)    All other components within normal limits    Imaging Review Ct  Head Wo Contrast  11/15/2013   CLINICAL DATA:  Headaches and nausea  EXAM: CT HEAD WITHOUT CONTRAST  TECHNIQUE: Contiguous axial images were obtained from the base of the skull through the vertex without intravenous contrast.  COMPARISON:  04/16/2012  FINDINGS: The bony calvarium is intact. The ventricles are of normal size and configuration. No findings to suggest acute hemorrhage, acute infarction or space-occupying mass lesion are noted.  IMPRESSION: No acute intracranial abnormality is noted.   Electronically Signed   By: Inez Catalina M.D.   On: 11/15/2013 21:59     EKG Interpretation None      MDM   Final diagnoses:  Iridocyclitis   one-week history of intermittent gradual onset headache. Today with left eye pain without trauma. Redness to the left eye with blurry vision, photophobia and pain. No temporal artery tenderness. IOP normal, doubt glaucoma.   Patient denies any fever, chills, nausea, vomiting. Visual acuity 20/70 on the left, 20/20 on the right Intraocular pressure 11 OS ESR 40, no temporal artery tenderness. Doubt meningitis, SAH, temporal arteritis, glaucoma. Anterior chamber clear on slit lamp.  Suspect iritis given patient's red eye, photophobia, and eye pain.  Discussed  with Dr. Iona Hansen in ophthalmology who agrees with treating for irdiocyclitis.  Will prescribe predforte, cyclogyl, ketorolac.  Uncertain etiology.  Patient denies any bodyaches, fever, trauma.  Dr. Iona Hansen will see in office on Monday. Patient instructed to return to the ED with worsening vision problems or pain.  Patient instructed not to use eye drops past Monday unless instructed to do so by Dr. Iona Hansen.  I personally performed the services described in this documentation, which was scribed in my presence. The recorded information has been reviewed and is accurate.   Ezequiel Essex, MD 11/16/13 0200

## 2013-11-15 NOTE — ED Notes (Signed)
EDP remains at bedside with use of tono pen and woods lamp

## 2013-11-15 NOTE — ED Notes (Signed)
Headache started about a week ago, intermittent. Last night headache became persistent and pt states eyes began hurting and she took and extra blood pressure pill. Now eyes are hurting. Left eye is red. Vision blurry.

## 2013-11-15 NOTE — ED Notes (Signed)
EDP with pt at this time 

## 2013-11-15 NOTE — ED Notes (Signed)
Dr. Wyvonnia Dusky at bedside for reevaluation/update

## 2013-11-15 NOTE — ED Notes (Signed)
Patient returned from Radiology. Slit lamp at bedside

## 2013-11-15 NOTE — Discharge Instructions (Signed)
Iritis Use the eye drops as prescribed. Follow up with Dr. Iona Hansen on Monday.  Do not use the eye drops past Monday unless Dr. Iona Hansen tells you to continue. Return to the ED if you develop worsening pain, vision problems or any other concerns. Iritis is an inflammation of the colored part of the eye (iris). Other parts at the front of the eye may also be inflamed. The iris is part of the middle layer of the eyeball which is called the uvea or the uveal track. Any part of the uveal track can become inflamed. The other portions of the uveal track are the choroid (the thin membrane under the outer layer of the eye), and the ciliary body (joins the choroid and the iris and produces the fluid in the front of the eye).  It is extremely important to treat iritis early, as it may lead to internal eye damage causing scarring or diseases such as glaucoma. Some people have only one attack of iritis (in one or both eyes) in their lifetime, while others may get it many times. CAUSES Iritis can be associated with many different diseases, but mostly occurs in otherwise healthy people. Examples of diseases that can be associated with iritis include:  Diseases where the body's immune system attacks tissues within your own body (autoimmune diseases).  Infections (tuberculosis, gonorrhea, fungus infections, Lyme disease, infection of the lining of the heart).  Trauma or injury.  Eye diseases (acute glaucoma and others).  Inflammation from other parts of the uveal track.  Severe eye infections.  Other rare diseases. SYMPTOMS  Eye pain or aching.  Sensitivity to light.  Loss of sight or blurred vision.  Redness of the eye. This is often accompanied by a ring of redness around the outside of the cornea, or clear covering at the front of the eye (ciliary flush).  Excessive tearing of the eye(s).  A small pupil that does not enlarge in the dark and stays smaller than the other eye's pupil.  A whitish area  that obscures the lower part of the colored circular iris. Sometimes this is visible when looking at the eye, where the whitish area has a "fluid level" or flat top. This is called a "hypopyon" and is actually pus inside the eye. Since iritis causes the eye to become red, it is often confused with a much less dangerous form of "pink eye" or conjunctivitis. One of the most important symptoms is sensitivity to light. Anytime there is redness, discomfort in the eye(s) and extreme light sensitivity, it is extremely important to see an ophthalmologist as soon as possible. TREATMENT Acute iritis requires prompt medical evaluation by an eye specialist (ophthalmologist.) Treatment depends on the underlying cause but may include:  Corticosteroid eye drops and dilating eye drops. Follow your caregiver's exact instructions on taking and stopping corticosteroid medications (drops or pills).  Occasionally, the iritis will be so severe that it will not respond to commonly used medications. If this happens, it may be necessary to use steroid injections. The injections are given under the eye's outer surface. Sometimes oral medications are given. The decision on treatment used for iritis is usually made on an individual basis. HOME CARE INSTRUCTIONS Your care giver will give specific instructions regarding the use of eye medications or other medications. Be certain to follow all instructions in both taking and stopping the medications. SEEK IMMEDIATE MEDICAL CARE IF:  You have redness of one or both eye.  You experience a great deal of light sensitivity.  You have pain or aching in either eye. MAKE SURE YOU:   Understand these instructions.  Will watch your condition.  Will get help right away if you are not doing well or get worse. Document Released: 04/04/2005 Document Revised: 06/27/2011 Document Reviewed: 09/22/2006 [facility] Patient Information 2015 [place], [place]. This information is not intended to  replace advice given to you by your health care provider. Make sure you discuss any questions you have with your health care provider.

## 2013-12-10 ENCOUNTER — Other Ambulatory Visit: Payer: Self-pay | Admitting: Adult Health

## 2013-12-19 ENCOUNTER — Encounter: Payer: Self-pay | Admitting: Adult Health

## 2013-12-19 ENCOUNTER — Ambulatory Visit (INDEPENDENT_AMBULATORY_CARE_PROVIDER_SITE_OTHER): Payer: Medicaid Other | Admitting: Adult Health

## 2013-12-19 VITALS — BP 132/78 | HR 68 | Ht 68.0 in | Wt 214.0 lb

## 2013-12-19 DIAGNOSIS — I509 Heart failure, unspecified: Secondary | ICD-10-CM

## 2013-12-19 DIAGNOSIS — R0602 Shortness of breath: Secondary | ICD-10-CM

## 2013-12-19 DIAGNOSIS — R0600 Dyspnea, unspecified: Secondary | ICD-10-CM

## 2013-12-19 DIAGNOSIS — I5032 Chronic diastolic (congestive) heart failure: Secondary | ICD-10-CM

## 2013-12-19 DIAGNOSIS — R0989 Other specified symptoms and signs involving the circulatory and respiratory systems: Secondary | ICD-10-CM

## 2013-12-19 DIAGNOSIS — R0609 Other forms of dyspnea: Secondary | ICD-10-CM

## 2013-12-19 DIAGNOSIS — R072 Precordial pain: Secondary | ICD-10-CM

## 2013-12-19 MED ORDER — HYDROCHLOROTHIAZIDE 25 MG PO TABS: ORAL_TABLET | ORAL | Status: DC

## 2013-12-19 NOTE — Patient Instructions (Addendum)
Your physician recommends that you schedule a follow-up appointment in: 1 month   Please follow low sodium diet guidelines we have provide for you    Your physician has recommended that you have a pulmonary function test. Pulmonary Function Tests are a group of tests that measure how well air moves in and out of your lungs.  Thank you for choosing [facility] !

## 2013-12-19 NOTE — Progress Notes (Deleted)
Name: Hannah Cooper    DOB: 10-Apr-1956  Age: 58 y.o.  MR#: [id number]       PCP:  Maggie Font, MD      Insurance: Payor: MEDICAID [place] / Plan: MEDICAID [place] ACCESS / Product Type: *No Product type* /   CC:    Chief Complaint  Patient presents with  . Hypertension  . Congestive Heart Failure    VS Filed Vitals:   12/19/13 1302  BP: 132/78  Pulse: 68  Height: 5\' 8"  (1.727 m)  Weight: 214 lb (97.07 kg)    Weights Current Weight  12/19/13 214 lb (97.07 kg)  11/15/13 195 lb (88.451 kg)  08/17/13 210 lb (95.255 kg)    Blood Pressure  BP Readings from Last 3 Encounters:  12/19/13 132/78  11/16/13 116/63  08/17/13 111/80     Admit date:  (Not on file) Last encounter with RMR:  12/10/2013   Allergy Other  Current Outpatient Prescriptions  Medication Sig Dispense Refill  . atorvastatin (LIPITOR) 20 MG tablet Take 20 mg by mouth at bedtime.      . cycloSPORINE (RESTASIS) 0.05 % ophthalmic emulsion Place 1 drop into both eyes 2 (two) times daily.      . hydrochlorothiazide (HYDRODIURIL) 25 MG tablet Take 1 tablet (25 mg total) by mouth daily.  90 tablet  1  . isosorbide mononitrate (IMDUR) 30 MG 24 hr tablet TAKE 1 TABLET (30 MG TOTAL) BY MOUTH DAILY.  90 tablet  1  . lisinopril (PRINIVIL,ZESTRIL) 10 MG tablet Take 10 mg by mouth daily.      . potassium chloride (K-DUR) 10 MEQ tablet Take 10 mEq by mouth as needed.        No current facility-administered medications for this visit.    Discontinued Meds:   There are no discontinued medications.  Patient Active Problem List   Diagnosis Date Noted  . Chronic diastolic CHF (congestive heart failure) 07/05/2013  . Lower extremity edema 07/05/2013  . Hyperlipidemia   . Chest pain 07/04/2013  . Hypokalemia 09/04/2012  . Precordial pain 09/04/2012  . Viral meningitis 01/22/2011  . Vomiting 01/22/2011  . PNEUMONIA, LEFT LOWER LOBE 10/10/2006  . DISEASE, ACUTE BRONCHOSPASM 10/10/2006  . HYPOTHYROIDISM 05/23/2006  .  HYPERTENSION 05/23/2006  . OSTEOARTHRITIS 05/23/2006    LABS    Component Value Date/Time   NA 142 11/15/2013 2124   NA 140 08/17/2013 1115   NA 142 07/17/2013 2229   K 3.8 11/15/2013 2124   K 3.5* 08/17/2013 1115   K 3.6* 07/17/2013 2229   CL 102 11/15/2013 2124   CL 100 08/17/2013 1115   CL 101 07/17/2013 2229   CO2 30 11/15/2013 2124   CO2 29 08/17/2013 1115   CO2 29 07/17/2013 2229   GLUCOSE 114* 11/15/2013 2124   GLUCOSE 115* 08/17/2013 1115   GLUCOSE 109* 07/17/2013 2229   BUN 12 11/15/2013 2124   BUN 10 08/17/2013 1115   BUN 12 07/17/2013 2229   CREATININE 0.80 11/15/2013 2124   CREATININE 0.80 08/17/2013 1115   CREATININE 0.80 07/17/2013 2229   CREATININE 0.85 07/12/2013 1558   CREATININE 0.81 09/20/2012 1412   CALCIUM 9.6 11/15/2013 2124   CALCIUM 9.8 08/17/2013 1115   CALCIUM 9.4 07/17/2013 2229   GFRNONAA 79* 11/15/2013 2124   GFRNONAA 79* 08/17/2013 1115   GFRNONAA 79* 07/17/2013 2229   GFRAA >90 11/15/2013 2124   GFRAA >90 08/17/2013 1115   GFRAA >90 07/17/2013 2229   CMP  Component Value Date/Time   NA 142 11/15/2013 2124   K 3.8 11/15/2013 2124   CL 102 11/15/2013 2124   CO2 30 11/15/2013 2124   GLUCOSE 114* 11/15/2013 2124   BUN 12 11/15/2013 2124   CREATININE 0.80 11/15/2013 2124   CREATININE 0.85 07/12/2013 1558   CALCIUM 9.6 11/15/2013 2124   PROT 7.9 11/15/2013 2124   ALBUMIN 4.0 11/15/2013 2124   AST 30 11/15/2013 2124   ALT 40* 11/15/2013 2124   ALKPHOS 80 11/15/2013 2124   BILITOT 0.2* 11/15/2013 2124   GFRNONAA 79* 11/15/2013 2124   GFRAA >90 11/15/2013 2124       Component Value Date/Time   WBC 3.6* 11/15/2013 2124   WBC 3.4* 08/17/2013 1115   WBC 3.7* 07/17/2013 2229   HGB 11.8* 11/15/2013 2124   HGB 12.2 08/17/2013 1115   HGB 11.1* 07/17/2013 2229   HCT 34.7* 11/15/2013 2124   HCT 35.4* 08/17/2013 1115   HCT 33.0* 07/17/2013 2229   MCV 88.3 11/15/2013 2124   MCV 88.3 08/17/2013 1115   MCV 88.7 07/17/2013 2229    Lipid Panel     Component Value Date/Time   CHOL 102 07/05/2013 0537   TRIG 97  07/05/2013 0537   HDL 46 07/05/2013 0537   CHOLHDL 2.2 07/05/2013 0537   VLDL 19 07/05/2013 0537   LDLCALC 37 07/05/2013 0537    ABG No results found for this basename: phart, pco2, pco2art, po2, po2art, hco3, tco2, acidbasedef, o2sat     Lab Results  Component Value Date   TSH 4.995* 07/05/2013   BNP (last 3 results)  Recent Labs  07/04/13 1948 07/17/13 2318 08/17/13 1115  PROBNP 19.6 36.2 106.6   Cardiac Panel (last 3 results) No results found for this basename: CKTOTAL, CKMB, TROPONINI, RELINDX,  in the last 72 hours  Iron/TIBC/Ferritin/ %Sat No results found for this basename: iron, tibc, ferritin, ironpctsat     EKG Orders placed in visit on 12/19/13  . EKG 12-LEAD     Prior Assessment and Plan Problem List as of 12/19/2013     Cardiovascular and Mediastinum   HYPERTENSION   Last Assessment & Plan   08/12/2013 Office Visit Written 08/12/2013  2:28 PM by Lendon Colonel, NP     Blood pressure is well controlled. She is advised to continue to take medications as directed. She is also to avoid salt.    Chronic diastolic CHF (congestive heart failure)   Last Assessment & Plan   08/12/2013 Office Visit Written 08/12/2013  2:27 PM by Lendon Colonel, NP     She states that she has had to take an extra dose of HCTZ for fluid retention over the last week. She is still eating some salty foods. She is reminded to be more mindful of salt intake.  She is planning on starting a walking program as she is unhappy with weight gain. I have encouraged her to do this. Breathing is still a problem for her. She is recommended to see PCP for PFT's evaluation.       Respiratory   PNEUMONIA, LEFT LOWER LOBE   DISEASE, ACUTE BRONCHOSPASM     Endocrine   HYPOTHYROIDISM   Last Assessment & Plan   09/20/2012 Office Visit Written 09/20/2012  2:15 PM by Lendon Colonel, NP     Recheck THS      Nervous and Auditory   Viral meningitis     Musculoskeletal and Integument    OSTEOARTHRITIS  Other   Vomiting   Hypokalemia   Last Assessment & Plan   11/12/2012 Office Visit Written 11/12/2012  4:46 PM by Lendon Colonel, NP     Recent labs demonstrate a potassium of 4.6.     Precordial pain   Last Assessment & Plan   09/20/2012 Office Visit Written 09/20/2012  2:16 PM by Lendon Colonel, NP     Resolved.    Chest pain   Last Assessment & Plan   08/12/2013 Office Visit Written 08/12/2013  2:28 PM by Lendon Colonel, NP     Resolved.    Hyperlipidemia   Lower extremity edema       Imaging: No results found.

## 2013-12-19 NOTE — Assessment & Plan Note (Signed)
Multifactorial. She is obese, and deconditioned. Will check PFTs. Her lung function. She has no history of asthma. She has no history of obstructive sleep apnea. She may be considered for a sleep study in this is found to be appropriate. I will see her back in one month to discuss her symptoms. She will need to follow with the primary care physician for other testing if necessary

## 2013-12-19 NOTE — Progress Notes (Signed)
    HPI: Hannah Cooper is a 58 year old patient to be followed by Dr.Branch, who is yet to be est., we follow for ongoing assessment and management of chest pain, hypertension, diastolic CHF. Most recent cardiac catheterization was in May of 2014 demonstrated normal coronary arteries. She was last seen in the office in April of 2015 post hospitalization for chest pain, found to have gallbladder disease.  She is here on followup after being seen in the emergency room for complaints of headache. Blood pressure in the ER, was 144/81. She was treated for iritis, she is to follow up with her primary care physician and ophthalmologist.   She today with complaints of worsening shortness of breath, especially when climbing stairs. She also has had some lower extremity edema. This has been scaring her and she had been seen in the hospital for diastolic CHF, and she was worried that she is beginning to retain fluid. The patient admits to still eating salty foods. Because of lower extremity edema. She increase her HCTZ from 25 mg daily to 50 mg daily. She did this for 5 days and did notice an improvement in her symptoms.   Allergies  Allergen Reactions  . Other Swelling    Avon lipstick    Current Outpatient Prescriptions  Medication Sig Dispense Refill  . atorvastatin (LIPITOR) 20 MG tablet Take 20 mg by mouth at bedtime.      . cycloSPORINE (RESTASIS) 0.05 % ophthalmic emulsion Place 1 drop into both eyes 2 (two) times daily.      . hydrochlorothiazide (HYDRODIURIL) 25 MG tablet Make take extra tablet for fluid retention  90 tablet  3  . isosorbide mononitrate (IMDUR) 30 MG 24 hr tablet TAKE 1 TABLET (30 MG TOTAL) BY MOUTH DAILY.  90 tablet  1  . lisinopril (PRINIVIL,ZESTRIL) 10 MG tablet Take 10 mg by mouth daily.      . potassium chloride (K-DUR) 10 MEQ tablet Take 10 mEq by mouth as needed.        No current facility-administered medications for this visit.    Past Medical History  Diagnosis  Date  . Meningitis   . Hypertension   . Hyperlipidemia     Past Surgical History  Procedure Laterality Date  . Abdominal hysterectomy    . Thyroid surgery  2002    ROS: Review of systems complete and found to be negative unless listed above  PHYSICAL EXAM BP 132/78  Pulse 68  Ht 5\' 8"  (1.727 m)  Wt 214 lb (97.07 kg)  BMI 32.55 kg/m2 General: Well developed, well nourished, in no acute distress, obese. Head: Eyes PERRLA, No xanthomas.   Normal cephalic and atramatic  Lungs: Clear bilaterally to auscultation and percussion. Heart: HRRR S1 S2, without MRG.  Pulses are 2+ & equal.            No carotid bruit. No JVD.  No abdominal bruits. No femoral bruits. Abdomen: Bowel sounds are positive, abdomen soft and non-tender without masses or                  Hernia's noted. Msk:  Back normal, normal gait. Normal strength and tone for age. Extremities: No clubbing, cyanosis or edema.  DP +1 Neuro: Alert and oriented X 3. Psych:  Good affect, responds appropriately   EKG:  Normal sinus rhythm, rate of 67 beats per minute.  ASSESSMENT AND PLAN

## 2013-12-19 NOTE — Assessment & Plan Note (Signed)
The patient may have been exhibiting symptoms of mild CHF, but this was easily improved with increased doses of HCTZ and potassium. I re\re enforce low-sodium diet. She is needing a lot of salty cheese crackers, processed meats, and is now going to be more careful about what she is eating. She is advised to increase her dose of a CT CPR and for fluid retention. If cutting back on sodium is not helping her. She will need to take an extra dose of potassium with each dose.  I will see her back in a month to reevaluate her symptoms.

## 2013-12-19 NOTE — Assessment & Plan Note (Signed)
This occurs with dyspnea. She has had normal coronaries per cardiac catheterization in May of 2015. I would not plan any further cardiac testing. She continues on nitrates.

## 2013-12-30 ENCOUNTER — Ambulatory Visit (HOSPITAL_COMMUNITY)
Admission: RE | Admit: 2013-12-30 | Discharge: 2013-12-30 | Disposition: A | Discharge status: Home / Self Care | Payer: Medicaid Other | Source: Ambulatory Visit | Attending: Adult Health | Admitting: Adult Health

## 2013-12-30 DIAGNOSIS — R0602 Shortness of breath: Secondary | ICD-10-CM | POA: Diagnosis not present

## 2013-12-30 DIAGNOSIS — I5032 Chronic diastolic (congestive) heart failure: Secondary | ICD-10-CM

## 2013-12-30 LAB — PULMONARY FUNCTION TEST
DL/VA % PRED: 95 %
DL/VA: 4.99 ml/min/mmHg/L
DLCO COR % PRED: 54 %
DLCO cor: 16.02 ml/min/mmHg
DLCO unc % pred: 54 %
DLCO unc: 16.02 ml/min/mmHg
FEF 25-75 Post: 3.48 L/sec
FEF 25-75 Pre: 2.28 L/sec
FEF2575-%CHANGE-POST: 52 %
FEF2575-%Pred-Post: 146 %
FEF2575-%Pred-Pre: 95 %
FEV1-%Change-Post: 9 %
FEV1-%Pred-Post: 83 %
FEV1-%Pred-Pre: 76 %
FEV1-PRE: 1.88 L
FEV1-Post: 2.07 L
FEV1FVC-%CHANGE-POST: -7 %
FEV1FVC-%PRED-PRE: 109 %
FEV6-%CHANGE-POST: 19 %
FEV6-%PRED-POST: 83 %
FEV6-%PRED-PRE: 69 %
FEV6-PRE: 2.12 L
FEV6-Post: 2.54 L
FEV6FVC-%PRED-POST: 103 %
FEV6FVC-%Pred-Pre: 103 %
FVC-%Change-Post: 18 %
FVC-%Pred-Post: 81 %
FVC-%Pred-Pre: 68 %
FVC-POST: 2.54 L
FVC-PRE: 2.14 L
POST FEV1/FVC RATIO: 81 %
POST FEV6/FVC RATIO: 100 %
PRE FEV1/FVC RATIO: 88 %
Pre FEV6/FVC Ratio: 100 %
RV % pred: 117 %
RV: 2.56 L
TLC % pred: 81 %
TLC: 4.6 L

## 2013-12-30 MED ORDER — ALBUTEROL SULFATE (2.5 MG/3ML) 0.083% IN NEBU
2.5000 mg | INHALATION_SOLUTION | Freq: Once | RESPIRATORY_TRACT | Status: AC
  Administered 2013-12-30: 2.5 mg via RESPIRATORY_TRACT

## 2014-01-17 NOTE — Progress Notes (Signed)
    ERROR. No show 

## 2014-01-20 ENCOUNTER — Encounter: Payer: Medicaid Other | Admitting: Adult Health

## 2014-02-07 ENCOUNTER — Encounter: Payer: Self-pay | Admitting: *Deleted

## 2014-03-03 ENCOUNTER — Emergency Department (HOSPITAL_COMMUNITY): Payer: Medicaid Other

## 2014-03-03 ENCOUNTER — Emergency Department (HOSPITAL_COMMUNITY)
Admission: EM | Admit: 2014-03-03 | Discharge: 2014-03-03 | Disposition: A | Discharge status: Home / Self Care | Payer: Medicaid Other | Attending: Emergency Medicine | Admitting: Emergency Medicine

## 2014-03-03 ENCOUNTER — Encounter (HOSPITAL_COMMUNITY): Payer: Self-pay | Admitting: *Deleted

## 2014-03-03 DIAGNOSIS — Z79899 Other long term (current) drug therapy: Secondary | ICD-10-CM | POA: Insufficient documentation

## 2014-03-03 DIAGNOSIS — E785 Hyperlipidemia, unspecified: Secondary | ICD-10-CM | POA: Insufficient documentation

## 2014-03-03 DIAGNOSIS — Z8669 Personal history of other diseases of the nervous system and sense organs: Secondary | ICD-10-CM | POA: Diagnosis not present

## 2014-03-03 DIAGNOSIS — M4726 Other spondylosis with radiculopathy, lumbar region: Secondary | ICD-10-CM | POA: Diagnosis not present

## 2014-03-03 DIAGNOSIS — Z7952 Long term (current) use of systemic steroids: Secondary | ICD-10-CM | POA: Diagnosis not present

## 2014-03-03 DIAGNOSIS — I10 Essential (primary) hypertension: Secondary | ICD-10-CM | POA: Insufficient documentation

## 2014-03-03 DIAGNOSIS — M545 Low back pain: Secondary | ICD-10-CM | POA: Diagnosis present

## 2014-03-03 DIAGNOSIS — M549 Dorsalgia, unspecified: Secondary | ICD-10-CM

## 2014-03-03 MED ORDER — HYDROMORPHONE HCL 1 MG/ML IJ SOLN
1.0000 mg | Freq: Once | INTRAMUSCULAR | Status: AC
  Administered 2014-03-03: 1 mg via INTRAMUSCULAR
  Filled 2014-03-03: qty 1

## 2014-03-03 MED ORDER — HYDROCODONE-ACETAMINOPHEN 5-325 MG PO TABS: 1.0000 | ORAL_TABLET | ORAL | Status: DC | PRN

## 2014-03-03 MED ORDER — PREDNISONE 50 MG PO TABS
ORAL_TABLET | ORAL | Status: AC
Start: 2014-03-03 — End: 2014-03-03
  Administered 2014-03-03: 50 mg
  Filled 2014-03-03: qty 1

## 2014-03-03 MED ORDER — PREDNISONE 10 MG PO TABS
ORAL_TABLET | ORAL | Status: AC
Start: 2014-03-03 — End: 2014-03-03
  Administered 2014-03-03: 10 mg
  Filled 2014-03-03: qty 1

## 2014-03-03 MED ORDER — ONDANSETRON 8 MG PO TBDP
8.0000 mg | ORAL_TABLET | Freq: Once | ORAL | Status: AC
  Administered 2014-03-03: 8 mg via ORAL
  Filled 2014-03-03: qty 1

## 2014-03-03 MED ORDER — PREDNISONE 10 MG PO TABS: ORAL_TABLET | ORAL | Status: DC

## 2014-03-03 NOTE — ED Provider Notes (Signed)
CSN: [id number]     Arrival date & time 03/03/14  1200 History  This chart was scribed for non-physician practitioner, Evalee Jefferson, PA-C,working with Veryl Speak, MD, by Marlowe Kays, ED Scribe. This patient was seen in room APFT21/APFT21 and the patient's care was started at 2:39 PM.  Chief Complaint  Patient presents with  . Back Pain   Patient is a 58 y.o. female presenting with back pain. The history is provided by the patient. No language interpreter was used.  Back Pain Associated symptoms: numbness   Associated symptoms: no abdominal pain, no chest pain, no dysuria, no fever and no weakness     HPI Comments:  Hannah Cooper is a 58 y.o. obese female with PMH of meningitis, DDD, HTN and HLD who presents to the Emergency Department complaining of severe lower back pain that radiates down her left leg that began about one week ago. She states she has experienced some associated left leg numbness about four days ago that lasted for a couple of hours but has since improved. Pt states that upon waking this morning and trying to turn over the pain was more intense. Reports taking Ibuprofen 400 mg with the last dose being five days ago. She states bending, twisting or movement makes the pain worse. Denies any alleviating factors. Denies dysuria, difficulty or frequency with urination, abdominal pain, fever or chills.   PCP is Dr. Iona Beard in [place]  Past Medical History  Diagnosis Date  . Meningitis   . Hypertension   . Hyperlipidemia    Past Surgical History  Procedure Laterality Date  . Abdominal hysterectomy    . Thyroid surgery  2002   Family History  Problem Relation Age of Onset  . Heart attack Mother 17  . Heart attack Sister 54   History  Substance Use Topics  . Smoking status: Never Smoker   . Smokeless tobacco: Never Used  . Alcohol Use: No   OB History    Gravida Para Term Preterm AB TAB SAB Ectopic Multiple Living            0     Review of  Systems  Constitutional: Negative for fever and chills.  Respiratory: Negative for shortness of breath.   Cardiovascular: Negative for chest pain and leg swelling.  Gastrointestinal: Negative for nausea, vomiting, abdominal pain, constipation and abdominal distention.  Genitourinary: Negative for dysuria, urgency, frequency, flank pain and difficulty urinating.  Musculoskeletal: Positive for back pain. Negative for joint swelling and gait problem.  Skin: Negative for rash.  Neurological: Positive for numbness. Negative for weakness.    Allergies  Other  Home Medications   Prior to Admission medications   Medication Sig Start Date End Date Taking? Authorizing Provider  atorvastatin (LIPITOR) 20 MG tablet Take 20 mg by mouth daily.    Yes Historical Provider, MD  cycloSPORINE (RESTASIS) 0.05 % ophthalmic emulsion Place 1 drop into both eyes 2 (two) times daily as needed (dry eyes).    Yes Historical Provider, MD  hydrochlorothiazide (HYDRODIURIL) 25 MG tablet Make take extra tablet for fluid retention Patient taking differently: Take 25-50 mg by mouth See admin instructions. Take 1 tablet by mouth daily.  May take 1 extra tablet if swelling occurs. 12/19/13  Yes Lendon Colonel, NP  isosorbide mononitrate (IMDUR) 30 MG 24 hr tablet TAKE 1 TABLET (30 MG TOTAL) BY MOUTH DAILY. 12/11/13  Yes Lendon Colonel, NP  lisinopril (PRINIVIL,ZESTRIL) 10 MG tablet Take 10 mg by mouth daily.  Yes Historical Provider, MD  potassium chloride (MICRO-K) 10 MEQ CR capsule Take 1 capsule by mouth daily as needed (only takes with extra hctz if swelling occurs).  02/15/14  Yes Historical Provider, MD  HYDROcodone-acetaminophen (NORCO/VICODIN) 5-325 MG per tablet Take 1 tablet by mouth every 4 (four) hours as needed. 03/03/14   Evalee Jefferson, PA-C  predniSONE (DELTASONE) 10 MG tablet 6, 5, 4, 3, 2 then 1 tablet by mouth daily for 6 days total. 03/03/14   Evalee Jefferson, PA-C   Triage Vitals: BP 143/85 mmHg  Pulse  62  Temp(Src) 98.9 F (37.2 C) (Oral)  Resp 16  Ht 5\' 6"  (1.676 m)  Wt 218 lb (98.884 kg)  BMI 35.20 kg/m2  SpO2 100% Physical Exam  Constitutional: She appears well-developed and well-nourished.  HENT:  Head: Normocephalic.  Eyes: Conjunctivae are normal.  Neck: Normal range of motion. Neck supple.  Cardiovascular: Normal rate and intact distal pulses.   Pedal pulses normal.  Pulmonary/Chest: Effort normal.  Abdominal: Soft. Bowel sounds are normal. She exhibits no distension and no mass.  Musculoskeletal: Normal range of motion. She exhibits no edema.       Lumbar back: She exhibits tenderness. She exhibits no swelling, no edema and no spasm.  Neurological: She is alert. She has normal strength. She displays no atrophy and no tremor. No sensory deficit. Gait normal.  No strength deficit noted in hip and knee flexor and extensor muscle groups.  Ankle flexion and extension intact. Slight decreased sensation to light touch of left lateral leg.  Skin: Skin is warm and dry.  Psychiatric: She has a normal mood and affect.  Nursing note and vitals reviewed.   ED Course  Procedures (including critical care time) DIAGNOSTIC STUDIES: Oxygen Saturation is 100% on RA, normal by my interpretation.   COORDINATION OF CARE: 2:51 PM- Will X-Ray lumbar spine and order pain medication. Instructed pt to follow up with PCP. Pt verbalizes understanding and agrees to plan.  Medications  HYDROmorphone (DILAUDID) injection 1 mg (1 mg Intramuscular Given 03/03/14 1503)  ondansetron (ZOFRAN-ODT) disintegrating tablet 8 mg (8 mg Oral Given 03/03/14 1506)  predniSONE (DELTASONE) 50 MG tablet (50 mg  Given 03/03/14 1619)  predniSONE (DELTASONE) 10 MG tablet (10 mg  Given 03/03/14 1619)    Labs Review Labs Reviewed - No data to display  Imaging Review No results found.   EKG Interpretation None      MDM   Final diagnoses:  Back pain  Osteoarthritis of spine with radiculopathy, lumbar  region    Pt was given dilaudid 1 mg IM and was able to ambulate more comfortably. Started on prednisone taper, also prescribed hydrocodone.  Encouraged activity as tolerated, heat. F/u with pcp if sx do not improve over the next 7-10 days.  No neuro deficit on exam or by history to suggest emergent or surgical presentation.  Also discussed worsened sx that should prompt immediate re-evaluation including distal weakness, bowel/bladder retention/incontinence.   I personally performed the services described in this documentation, which was scribed in my presence. The recorded information has been reviewed and is accurate.    Evalee Jefferson, PA-C 03/05/14 [place], MD 03/07/14 2111

## 2014-03-03 NOTE — Discharge Instructions (Signed)
Lumbosacral Strain Lumbosacral strain is a strain of any of the parts that make up your lumbosacral vertebrae. Your lumbosacral vertebrae are the bones that make up the lower third of your backbone. Your lumbosacral vertebrae are held together by muscles and tough, fibrous tissue (ligaments).  CAUSES  A sudden blow to your back can cause lumbosacral strain. Also, anything that causes an excessive stretch of the muscles in the low back can cause this strain. This is typically seen when people exert themselves strenuously, fall, lift heavy objects, bend, or crouch repeatedly. RISK FACTORS  Physically demanding work.  Participation in pushing or pulling sports or sports that require a sudden twist of the back (tennis, golf, baseball).  Weight lifting.  Excessive lower back curvature.  Forward-tilted pelvis.  Weak back or abdominal muscles or both.  Tight hamstrings. SIGNS AND SYMPTOMS  Lumbosacral strain may cause pain in the area of your injury or pain that moves (radiates) down your leg.  DIAGNOSIS Your health care provider can often diagnose lumbosacral strain through a physical exam. In some cases, you may need tests such as X-ray exams.  TREATMENT  Treatment for your lower back injury depends on many factors that your clinician will have to evaluate. However, most treatment will include the use of anti-inflammatory medicines. HOME CARE INSTRUCTIONS   Avoid hard physical activities (tennis, racquetball, waterskiing) if you are not in proper physical condition for it. This may aggravate or create problems.  If you have a back problem, avoid sports requiring sudden body movements. Swimming and walking are generally safer activities.  Maintain good posture.  Maintain a healthy weight.  For acute conditions, you may put ice on the injured area.  Put ice in a plastic bag.  Place a towel between your skin and the bag.  Leave the ice on for 20 minutes, 2-3 times a day.  When the  low back starts healing, stretching and strengthening exercises may be recommended. SEEK MEDICAL CARE IF:  Your back pain is getting worse.  You experience severe back pain not relieved with medicines. SEEK IMMEDIATE MEDICAL CARE IF:   You have numbness, tingling, weakness, or problems with the use of your arms or legs.  There is a change in bowel or bladder control.  You have increasing pain in any area of the body, including your belly (abdomen).  You notice shortness of breath, dizziness, or feel faint.  You feel sick to your stomach (nauseous), are throwing up (vomiting), or become sweaty.  You notice discoloration of your toes or legs, or your feet get very cold. MAKE SURE YOU:   Understand these instructions.  Will watch your condition.  Will get help right away if you are not doing well or get worse. Document Released: 01/12/2005 Document Revised: 04/09/2013 Document Reviewed: 11/21/2012 [facility] Patient Information 2015 [place], [place]. This information is not intended to replace advice given to you by your health care provider. Make sure you discuss any questions you have with your health care provider.   Take your next dose of prednisone tomorrow afternoon.  Use the the other medicines as directed.  Do not drive within 4 hours of taking hydrocodone as this will make you drowsy.  Avoid lifting,  Bending,  Twisting or any other activity that worsens your pain over the next week.  Apply an  icepack  to your lower back for 10-15 minutes every 2 hours for the next 2 days.  You should get rechecked if your symptoms are not better over  the next 5 days,  Or you develop increased pain,  Weakness in your leg(s) or loss of bladder or bowel function - these are symptoms of a worse injury.

## 2014-03-03 NOTE — ED Notes (Signed)
Low back pain for 1 week, Hx of DDD, No known injury. Pain radiates down lt leg at times.

## 2014-03-03 NOTE — ED Notes (Signed)
Pt woke up with back pain starting 1 week ago. Pain is worse with movement. Denies injury to back in the last week.

## 2014-03-14 ENCOUNTER — Other Ambulatory Visit: Payer: Self-pay | Admitting: Adult Health

## 2014-03-17 NOTE — Telephone Encounter (Signed)
refilled 

## 2014-03-22 ENCOUNTER — Other Ambulatory Visit: Payer: Self-pay | Admitting: Adult Health

## 2014-03-27 ENCOUNTER — Encounter (HOSPITAL_COMMUNITY): Payer: Self-pay | Admitting: Cardiovascular Disease

## 2014-04-15 ENCOUNTER — Encounter: Payer: Self-pay | Admitting: *Deleted

## 2014-05-13 ENCOUNTER — Other Ambulatory Visit: Payer: Self-pay | Admitting: Adult Health

## 2014-05-16 ENCOUNTER — Ambulatory Visit (INDEPENDENT_AMBULATORY_CARE_PROVIDER_SITE_OTHER): Payer: Medicaid Other | Admitting: Adult Health

## 2014-05-16 ENCOUNTER — Encounter: Payer: Self-pay | Admitting: Adult Health

## 2014-05-16 VITALS — BP 126/78 | HR 80 | Ht 67.0 in | Wt 218.0 lb

## 2014-05-16 DIAGNOSIS — J441 Chronic obstructive pulmonary disease with (acute) exacerbation: Secondary | ICD-10-CM

## 2014-05-16 MED ORDER — ALBUTEROL SULFATE HFA 108 (90 BASE) MCG/ACT IN AERS: 2.0000 | INHALATION_SPRAY | RESPIRATORY_TRACT | Status: DC | PRN

## 2014-05-16 NOTE — Progress Notes (Deleted)
Name: Hannah Cooper    DOB: [birth year]  Age: 59 y.o.  MR#: [id number]       PCP:  Maggie Font, MD      Insurance: Payor: MEDICAID [place] / Plan: MEDICAID [place] ACCESS / Product Type: *No Product type* /   CC:    Chief Complaint  Patient presents with  . Chest Pain  . Congestive Heart Failure    VS Filed Vitals:   05/16/14 1517  BP: 126/78  Pulse: 80  Height: 5\' 7"  (1.702 m)  Weight: 218 lb (98.884 kg)  SpO2: 98%    Weights Current Weight  05/16/14 218 lb (98.884 kg)  03/03/14 218 lb (98.884 kg)  12/19/13 214 lb (97.07 kg)    Blood Pressure  BP Readings from Last 3 Encounters:  05/16/14 126/78  03/03/14 143/85  12/19/13 132/78     Admit date:  (Not on file) Last encounter with RMR:  05/13/2014   Allergy Other  Current Outpatient Prescriptions  Medication Sig Dispense Refill  . atorvastatin (LIPITOR) 20 MG tablet Take 20 mg by mouth daily.     . cycloSPORINE (RESTASIS) 0.05 % ophthalmic emulsion Place 1 drop into both eyes 2 (two) times daily as needed (dry eyes).     . furosemide (LASIX) 20 MG tablet Take 20 mg by mouth daily.    Marland Kitchen HYDROcodone-acetaminophen (NORCO/VICODIN) 5-325 MG per tablet Take 1 tablet by mouth every 4 (four) hours as needed. 15 tablet 0  . isosorbide mononitrate (IMDUR) 30 MG 24 hr tablet TAKE 1 TABLET (30 MG TOTAL) BY MOUTH DAILY. 90 tablet 1  . lisinopril (PRINIVIL,ZESTRIL) 10 MG tablet Take 10 mg by mouth daily.    . potassium chloride (MICRO-K) 10 MEQ CR capsule TAKE ONE CAPSULE BY MOUTH AS NEEDED 90 capsule 2   No current facility-administered medications for this visit.    Discontinued Meds:    Medications Discontinued During This Encounter  Medication Reason  . lisinopril (PRINIVIL,ZESTRIL) 10 MG tablet Duplicate  . hydrochlorothiazide (HYDRODIURIL) 25 MG tablet Discontinued by provider  . hydrochlorothiazide (HYDRODIURIL) 25 MG tablet Discontinued by provider  . potassium chloride (MICRO-K) 10 MEQ CR capsule Discontinued by  provider  . predniSONE (DELTASONE) 10 MG tablet Completed Course    Patient Active Problem List   Diagnosis Date Noted  . Dyspnea 12/19/2013  . Chronic diastolic CHF (congestive heart failure) 07/05/2013  . Lower extremity edema 07/05/2013  . Hyperlipidemia   . Chest pain 07/04/2013  . Hypokalemia 09/04/2012  . Precordial pain 09/04/2012  . Viral meningitis 01/22/2011  . Vomiting 01/22/2011  . PNEUMONIA, LEFT LOWER LOBE 10/10/2006  . DISEASE, ACUTE BRONCHOSPASM 10/10/2006  . HYPOTHYROIDISM 05/23/2006  . HYPERTENSION 05/23/2006  . OSTEOARTHRITIS 05/23/2006    LABS    Component Value Date/Time   NA 142 11/15/2013 2124   NA 140 08/17/2013 1115   NA 142 07/17/2013 2229   K 3.8 11/15/2013 2124   K 3.5* 08/17/2013 1115   K 3.6* 07/17/2013 2229   CL 102 11/15/2013 2124   CL 100 08/17/2013 1115   CL 101 07/17/2013 2229   CO2 30 11/15/2013 2124   CO2 29 08/17/2013 1115   CO2 29 07/17/2013 2229   GLUCOSE 114* 11/15/2013 2124   GLUCOSE 115* 08/17/2013 1115   GLUCOSE 109* 07/17/2013 2229   BUN 12 11/15/2013 2124   BUN 10 08/17/2013 1115   BUN 12 07/17/2013 2229   CREATININE 0.80 11/15/2013 2124   CREATININE 0.80 08/17/2013 1115   CREATININE 0.80  07/17/2013 2229   CREATININE 0.85 07/12/2013 1558   CREATININE 0.81 09/20/2012 1412   CALCIUM 9.6 11/15/2013 2124   CALCIUM 9.8 08/17/2013 1115   CALCIUM 9.4 07/17/2013 2229   GFRNONAA 79* 11/15/2013 2124   GFRNONAA 79* 08/17/2013 1115   GFRNONAA 79* 07/17/2013 2229   GFRAA >90 11/15/2013 2124   GFRAA >90 08/17/2013 1115   GFRAA >90 07/17/2013 2229   CMP     Component Value Date/Time   NA 142 11/15/2013 2124   K 3.8 11/15/2013 2124   CL 102 11/15/2013 2124   CO2 30 11/15/2013 2124   GLUCOSE 114* 11/15/2013 2124   BUN 12 11/15/2013 2124   CREATININE 0.80 11/15/2013 2124   CREATININE 0.85 07/12/2013 1558   CALCIUM 9.6 11/15/2013 2124   PROT 7.9 11/15/2013 2124   ALBUMIN 4.0 11/15/2013 2124   AST 30 11/15/2013 2124    ALT 40* 11/15/2013 2124   ALKPHOS 80 11/15/2013 2124   BILITOT 0.2* 11/15/2013 2124   GFRNONAA 79* 11/15/2013 2124   GFRAA >90 11/15/2013 2124       Component Value Date/Time   WBC 3.6* 11/15/2013 2124   WBC 3.4* 08/17/2013 1115   WBC 3.7* 07/17/2013 2229   HGB 11.8* 11/15/2013 2124   HGB 12.2 08/17/2013 1115   HGB 11.1* 07/17/2013 2229   HCT 34.7* 11/15/2013 2124   HCT 35.4* 08/17/2013 1115   HCT 33.0* 07/17/2013 2229   MCV 88.3 11/15/2013 2124   MCV 88.3 08/17/2013 1115   MCV 88.7 07/17/2013 2229    Lipid Panel     Component Value Date/Time   CHOL 102 07/05/2013 0537   TRIG 97 07/05/2013 0537   HDL 46 07/05/2013 0537   CHOLHDL 2.2 07/05/2013 0537   VLDL 19 07/05/2013 0537   LDLCALC 37 07/05/2013 0537    ABG No results found for: PHART, PCO2ART, PO2ART, HCO3, TCO2, ACIDBASEDEF, O2SAT   Lab Results  Component Value Date   TSH 4.995* 07/05/2013   BNP (last 3 results)  Recent Labs  07/04/13 1948 07/17/13 2318 08/17/13 1115  PROBNP 19.6 36.2 106.6   Cardiac Panel (last 3 results) No results for input(s): CKTOTAL, CKMB, TROPONINI, RELINDX in the last 72 hours.  Iron/TIBC/Ferritin/ %Sat No results found for: IRON, TIBC, FERRITIN, IRONPCTSAT   EKG Orders placed or performed in visit on 12/19/13  . EKG 12-Lead     Prior Assessment and Plan Problem List as of 05/16/2014      Cardiovascular and Mediastinum   HYPERTENSION   Last Assessment & Plan 08/12/2013 Office Visit Written 08/12/2013  2:28 PM by Lendon Colonel, NP    Blood pressure is well controlled. She is advised to continue to take medications as directed. She is also to avoid salt.      Chronic diastolic CHF (congestive heart failure)   Last Assessment & Plan 12/19/2013 Office Visit Written 12/19/2013  1:51 PM by Lendon Colonel, NP    The patient may have been exhibiting symptoms of mild CHF, but this was easily improved with increased doses of HCTZ and potassium. I re\re enforce low-sodium diet.  She is needing a lot of salty cheese crackers, processed meats, and is now going to be more careful about what she is eating. She is advised to increase her dose of a CT CPR and for fluid retention. If cutting back on sodium is not helping her. She will need to take an extra dose of potassium with each dose.  I will see her back  in a month to reevaluate her symptoms.        Respiratory   PNEUMONIA, LEFT LOWER LOBE   DISEASE, ACUTE BRONCHOSPASM     Endocrine   HYPOTHYROIDISM   Last Assessment & Plan 09/20/2012 Office Visit Written 09/20/2012  2:15 PM by Lendon Colonel, NP    Recheck THS        Nervous and Auditory   Viral meningitis     Musculoskeletal and Integument   OSTEOARTHRITIS     Other   Vomiting   Hypokalemia   Last Assessment & Plan 11/12/2012 Office Visit Written 11/12/2012  4:46 PM by Lendon Colonel, NP    Recent labs demonstrate a potassium of 4.6.       Precordial pain   Last Assessment & Plan 09/20/2012 Office Visit Written 09/20/2012  2:16 PM by Lendon Colonel, NP    Resolved.      Chest pain   Last Assessment & Plan 12/19/2013 Office Visit Written 12/19/2013  1:52 PM by Lendon Colonel, NP    This occurs with dyspnea. She has had normal coronaries per cardiac catheterization in May of 2015. I would not plan any further cardiac testing. She continues on nitrates.      Hyperlipidemia   Lower extremity edema   Dyspnea   Last Assessment & Plan 12/19/2013 Office Visit Written 12/19/2013  1:52 PM by Lendon Colonel, NP    Multifactorial. She is obese, and deconditioned. Will check PFTs. Her lung function. She has no history of asthma. She has no history of obstructive sleep apnea. She may be considered for a sleep study in this is found to be appropriate. I will see her back in one month to discuss her symptoms. She will need to follow with the primary care physician for other testing if necessary          Imaging: No results found.

## 2014-05-16 NOTE — Patient Instructions (Signed)
Your physician wants you to follow-up in: 6 months with [name] NP You will receive a reminder letter in the mail two months in advance. If you don't receive a letter, please call our office to schedule the follow-up appointment.  Your physician recommends that you continue on your current medications as directed. Please refer to the Current Medication list given to you today.   Please use Proventil inhaler as directed    We have referred you to Dr.Edward Harrison [facility] pulmonologist   [phone]     Thank you for choosing [facility] !

## 2014-05-16 NOTE — Assessment & Plan Note (Signed)
No evidence of fluid overload at this time. Continue low sodium diet.

## 2014-05-16 NOTE — Progress Notes (Signed)
HPI: [name] is a 59 year old patient of Dr. Harl Bowie, who is yet to be established with him, we are following for ongoing assessment and management of chest pain, hypertension, diastolic CHF.  Most recent cardiac catheterization in 2014 demonstrated normal coronary arteries.      Most recent hospitalization in April 2015 revealed gallbladder disease.  The patient was last seen in the office in September 2015.  At that time, she had complains of worsening shortness of breath, especially when climbing stairs.  She also has mild lower extremity edema.the patient admitted to dietary noncompliance, eating salty foods.      She was counseled on low sodium diet.  She was advised that if she began to retain fluid.  She did take an extra dose of HCTZ, and extra potassium at that time.  The patient was severely deconditioned due to obesity.  PFTs were checked.  PFTs revealedMild until her a defect without definite airflow obstruction.  There is bronchodilator improvement.  This is consistent with COPD. She continues to have DOE.    Allergies  Allergen Reactions  . Other Swelling    [place] lipstick    Current Outpatient Prescriptions  Medication Sig Dispense Refill  . atorvastatin (LIPITOR) 20 MG tablet Take 20 mg by mouth daily.     . cycloSPORINE (RESTASIS) 0.05 % ophthalmic emulsion Place 1 drop into both eyes 2 (two) times daily as needed (dry eyes).     . furosemide (LASIX) 20 MG tablet Take 20 mg by mouth daily.    Marland Kitchen HYDROcodone-acetaminophen (NORCO/VICODIN) 5-325 MG per tablet Take 1 tablet by mouth every 4 (four) hours as needed. 15 tablet 0  . isosorbide mononitrate (IMDUR) 30 MG 24 hr tablet TAKE 1 TABLET (30 MG TOTAL) BY MOUTH DAILY. 90 tablet 1  . lisinopril (PRINIVIL,ZESTRIL) 10 MG tablet Take 10 mg by mouth daily.    . potassium chloride (MICRO-K) 10 MEQ CR capsule TAKE ONE CAPSULE BY MOUTH AS NEEDED 90 capsule 2   No current facility-administered medications for this visit.     Past Medical History  Diagnosis Date  . Meningitis   . Hypertension   . Hyperlipidemia     Past Surgical History  Procedure Laterality Date  . Abdominal hysterectomy    . Thyroid surgery  2002  . Left heart catheterization with coronary angiogram N/A 09/05/2012    Procedure: LEFT HEART CATHETERIZATION WITH CORONARY ANGIOGRAM;  Surgeon: Wellington Hampshire, MD;  Location: [facility] CATH LAB;  Service: Cardiovascular;  Laterality: N/A;    ROS: Complete review of systems performed and found to be negative unless outlined above  PHYSICAL EXAM BP 126/78 mmHg  Pulse 80  Ht 5\' 7"  (1.702 m)  Wt 218 lb (98.884 kg)  BMI 34.14 kg/m2  SpO2 98%  General: Well developed, well nourished, in no acute distress Head: Eyes PERRLA, No xanthomas.   Normal cephalic and atramatic  Lungs: Clear bilaterally to auscultation and percussion. Occasional inspiratory wheezing.  Voice is hoarse.  Heart: HRRR S1 S2, without MRG.  Pulses are 2+ & equal.            No carotid bruit. No JVD.  No abdominal bruits. No femoral bruits. Abdomen: Bowel sounds are positive, abdomen soft and non-tender without masses or                  Hernia's noted. Msk:  Back normal, normal gait. Normal strength and tone for age. Extremities: No clubbing, cyanosis or edema.  DP +  1 Neuro: Alert and oriented X 3. Psych:  Good affect, responds appropriately   ASSESSMENT AND PLAN

## 2014-05-16 NOTE — Assessment & Plan Note (Signed)
Excellent control of BP currently. No changes in her medications. She will see [place] in 6 months.

## 2014-05-16 NOTE — Assessment & Plan Note (Signed)
PFT's abnormal. She is found to have evidence of COPD. Will refer to Dr. Luan Pulling for pulmonology consult. She will be started on Proventil inhaler prn. We will defer any further testing and treatment to Dr. Luan Pulling. She is willing to see him.

## 2014-06-05 ENCOUNTER — Emergency Department (HOSPITAL_COMMUNITY)
Admission: EM | Admit: 2014-06-05 | Discharge: 2014-06-05 | Disposition: A | Discharge status: Home / Self Care | Payer: Medicaid Other | Attending: Emergency Medicine | Admitting: Emergency Medicine

## 2014-06-05 ENCOUNTER — Emergency Department (HOSPITAL_COMMUNITY): Payer: Medicaid Other

## 2014-06-05 ENCOUNTER — Encounter (HOSPITAL_COMMUNITY): Payer: Self-pay

## 2014-06-05 DIAGNOSIS — M5431 Sciatica, right side: Secondary | ICD-10-CM | POA: Diagnosis not present

## 2014-06-05 DIAGNOSIS — I10 Essential (primary) hypertension: Secondary | ICD-10-CM | POA: Diagnosis not present

## 2014-06-05 DIAGNOSIS — Z79899 Other long term (current) drug therapy: Secondary | ICD-10-CM | POA: Insufficient documentation

## 2014-06-05 DIAGNOSIS — Z8661 Personal history of infections of the central nervous system: Secondary | ICD-10-CM | POA: Insufficient documentation

## 2014-06-05 DIAGNOSIS — J410 Simple chronic bronchitis: Secondary | ICD-10-CM | POA: Diagnosis not present

## 2014-06-05 DIAGNOSIS — M25551 Pain in right hip: Secondary | ICD-10-CM | POA: Diagnosis present

## 2014-06-05 DIAGNOSIS — R52 Pain, unspecified: Secondary | ICD-10-CM

## 2014-06-05 DIAGNOSIS — F419 Anxiety disorder, unspecified: Secondary | ICD-10-CM | POA: Insufficient documentation

## 2014-06-05 DIAGNOSIS — E785 Hyperlipidemia, unspecified: Secondary | ICD-10-CM | POA: Insufficient documentation

## 2014-06-05 LAB — BASIC METABOLIC PANEL
Anion gap: 7 (ref 5–15)
BUN: 12 mg/dL (ref 6–23)
CHLORIDE: 109 mmol/L (ref 96–112)
CO2: 26 mmol/L (ref 19–32)
CREATININE: 0.85 mg/dL (ref 0.50–1.10)
Calcium: 9.4 mg/dL (ref 8.4–10.5)
GFR calc non Af Amer: 73 mL/min — ABNORMAL LOW (ref >90)
GFR, EST AFRICAN AMERICAN: 85 mL/min — AB (ref >90)
Glucose, Bld: 88 mg/dL (ref 70–99)
Potassium: 3.8 mmol/L (ref 3.5–5.1)
Sodium: 142 mmol/L (ref 135–145)

## 2014-06-05 LAB — CBC WITH DIFFERENTIAL/PLATELET
Basophils Absolute: 0 10*3/uL (ref 0.0–0.1)
Basophils Relative: 0 % (ref 0–1)
EOS ABS: 0.1 10*3/uL (ref 0.0–0.7)
EOS PCT: 2 % (ref 0–5)
HCT: 35.8 % — ABNORMAL LOW (ref 36.0–46.0)
Hemoglobin: 11.9 g/dL — ABNORMAL LOW (ref 12.0–15.0)
LYMPHS ABS: 1.6 10*3/uL (ref 0.7–4.0)
Lymphocytes Relative: 37 % (ref 12–46)
MCH: 29.8 pg (ref 26.0–34.0)
MCHC: 33.2 g/dL (ref 30.0–36.0)
MCV: 89.7 fL (ref 78.0–100.0)
MONO ABS: 0.3 10*3/uL (ref 0.1–1.0)
Monocytes Relative: 8 % (ref 3–12)
Neutro Abs: 2.2 10*3/uL (ref 1.7–7.7)
Neutrophils Relative %: 53 % (ref 43–77)
PLATELETS: 248 10*3/uL (ref 150–400)
RBC: 3.99 MIL/uL (ref 3.87–5.11)
RDW: 13.7 % (ref 11.5–15.5)
WBC: 4.2 10*3/uL (ref 4.0–10.5)

## 2014-06-05 LAB — TROPONIN I: Troponin I: <0.03 ng/mL (ref <0.031)

## 2014-06-05 MED ORDER — PREDNISONE 50 MG PO TABS
ORAL_TABLET | ORAL | Status: AC
  Filled 2014-06-05: qty 1

## 2014-06-05 MED ORDER — HYDROCODONE-ACETAMINOPHEN 5-325 MG PO TABS
1.0000 | ORAL_TABLET | Freq: Once | ORAL | Status: AC
  Administered 2014-06-05: 1 via ORAL
  Filled 2014-06-05: qty 1

## 2014-06-05 MED ORDER — PREDNISONE 10 MG PO TABS: ORAL_TABLET | ORAL | Status: DC

## 2014-06-05 MED ORDER — HYDROCODONE-ACETAMINOPHEN 5-325 MG PO TABS: 1.0000 | ORAL_TABLET | ORAL | Status: DC | PRN

## 2014-06-05 MED ORDER — PREDNISONE 50 MG PO TABS
60.0000 mg | ORAL_TABLET | Freq: Once | ORAL | Status: AC
  Administered 2014-06-05: 60 mg via ORAL
  Filled 2014-06-05 (×2): qty 1

## 2014-06-05 NOTE — Discharge Instructions (Signed)
Sciatica Sciatica is pain, weakness, numbness, or tingling along the path of the sciatic nerve. The nerve starts in the lower back and runs down the back of each leg. The nerve controls the muscles in the lower leg and in the back of the knee, while also providing sensation to the back of the thigh, lower leg, and the sole of your foot. Sciatica is a symptom of another medical condition. For instance, nerve damage or certain conditions, such as a herniated disk or bone spur on the spine, pinch or put pressure on the sciatic nerve. This causes the pain, weakness, or other sensations normally associated with sciatica. Generally, sciatica only affects one side of the body. CAUSES   Herniated or slipped disc.  Degenerative disk disease.  A pain disorder involving the narrow muscle in the buttocks (piriformis syndrome).  Pelvic injury or fracture.  Pregnancy.  Tumor (rare). SYMPTOMS  Symptoms can vary from mild to very severe. The symptoms usually travel from the low back to the buttocks and down the back of the leg. Symptoms can include: 1. Mild tingling or dull aches in the lower back, leg, or hip. 2. Numbness in the back of the calf or sole of the foot. 3. Burning sensations in the lower back, leg, or hip. 4. Sharp pains in the lower back, leg, or hip. 5. Leg weakness. 6. Severe back pain inhibiting movement. These symptoms may get worse with coughing, sneezing, laughing, or prolonged sitting or standing. Also, being overweight may worsen symptoms. DIAGNOSIS  Your caregiver will perform a physical exam to look for common symptoms of sciatica. He or she may ask you to do certain movements or activities that would trigger sciatic nerve pain. Other tests may be performed to find the cause of the sciatica. These may include:  Blood tests.  X-rays.  Imaging tests, such as an MRI or CT scan. TREATMENT  Treatment is directed at the cause of the sciatic pain. Sometimes, treatment is not  necessary and the pain and discomfort goes away on its own. If treatment is needed, your caregiver may suggest:  Over-the-counter medicines to relieve pain.  Prescription medicines, such as anti-inflammatory medicine, muscle relaxants, or narcotics.  Applying heat or ice to the painful area.  Steroid injections to lessen pain, irritation, and inflammation around the nerve.  Reducing activity during periods of pain.  Exercising and stretching to strengthen your abdomen and improve flexibility of your spine. Your caregiver may suggest losing weight if the extra weight makes the back pain worse.  Physical therapy.  Surgery to eliminate what is pressing or pinching the nerve, such as a bone spur or part of a herniated disk. HOME CARE INSTRUCTIONS   Only take over-the-counter or prescription medicines for pain or discomfort as directed by your caregiver.  Apply ice to the affected area for 20 minutes, 3-4 times a day for the first 48-72 hours. Then try heat in the same way.  Exercise, stretch, or perform your usual activities if these do not aggravate your pain.  Attend physical therapy sessions as directed by your caregiver.  Keep all follow-up appointments as directed by your caregiver.  Do not wear high heels or shoes that do not provide proper support.  Check your mattress to see if it is too soft. A firm mattress may lessen your pain and discomfort. SEEK IMMEDIATE MEDICAL CARE IF:   You lose control of your bowel or bladder (incontinence).  You have increasing weakness in the lower back, pelvis, buttocks,  or legs.  You have redness or swelling of your back.  You have a burning sensation when you urinate.  You have pain that gets worse when you lie down or awakens you at night.  Your pain is worse than you have experienced in the past.  Your pain is lasting longer than 4 weeks.  You are suddenly losing weight without reason. MAKE SURE YOU:  Understand these  instructions.  Will watch your condition.  Will get help right away if you are not doing well or get worse. Document Released: 03/29/2001 Document Revised: 10/04/2011 Document Reviewed: 08/14/2011 [facility] Patient Information 2015 [place], [place]. This information is not intended to replace advice given to you by your health care provider. Make sure you discuss any questions you have with your health care provider.  Chronic Obstructive Pulmonary Disease Chronic obstructive pulmonary disease (COPD) is a common lung problem. In COPD, the flow of air from the lungs is limited. The way your lungs work will probably never return to normal, but there are things you can do to improve your lungs and make yourself feel better. HOME CARE  Take all medicines as told by your doctor.  Avoid medicines or cough syrups that dry up your airway (such as antihistamines) and do not allow you to get rid of thick spit. You do not need to avoid them if told differently by your doctor.  If you smoke, stop. Smoking makes the problem worse.  Avoid being around things that make your breathing worse (like smoke, chemicals, and fumes).  Use oxygen therapy and therapy to help improve your lungs (pulmonary rehabilitation) if told by your doctor. If you need home oxygen therapy, ask your doctor if you should buy a tool to measure your oxygen level (oximeter).  Avoid people who have a sickness you can catch (contagious).  Avoid going outside when it is very hot, cold, or humid.  Eat healthy foods. Eat smaller meals more often. Rest before meals.  Stay active, but remember to also rest.  Make sure to get all the shots (vaccines) your doctor recommends. Ask your doctor if you need a pneumonia shot.  Learn and use tips on how to relax.  Learn and use tips on how to control your breathing as told by your doctor. Try:  Breathing in (inhaling) through your nose for 1 second. Then, pucker your lips and breath out  (exhale) through your lips for 2 seconds.  Putting one hand on your belly (abdomen). Breathe in slowly through your nose for 1 second. Your hand on your belly should move out. Pucker your lips and breathe out slowly through your lips. Your hand on your belly should move in as you breathe out.  Learn and use controlled coughing to clear thick spit from your lungs. The steps are: 7. Lean your head a little forward. 8. Breathe in deeply. 9. Try to hold your breath for 3 seconds. 10. Keep your mouth slightly open while coughing 2 times. 11. Spit any thick spit out into a tissue. 12. Rest and do the steps again 1 or 2 times as needed. GET HELP IF:  You cough up more thick spit than usual.  There is a change in the color or thickness of the spit.  It is harder to breathe than usual.  Your breathing is faster than usual. GET HELP RIGHT AWAY IF:   You have shortness of breath while resting.  You have shortness of breath that stops you from:  Being able  to talk.  Doing normal activities.  You chest hurts for longer than 5 minutes.  Your skin color is more blue than usual.  Your pulse oximeter shows that you have low oxygen for longer than 5 minutes. MAKE SURE YOU:   Understand these instructions.  Will watch your condition.  Will get help right away if you are not doing well or get worse. Document Released: 09/21/2007 Document Revised: 08/19/2013 Document Reviewed: 11/29/2012 [facility] Patient Information 2015 [place], [place]. This information is not intended to replace advice given to you by your health care provider. Make sure you discuss any questions you have with your health care provider.

## 2014-06-05 NOTE — ED Notes (Signed)
nad noted prior to dc. Dc instructions reviewed and explained. Voiced understanding. Rx's were given to pt. Pt requesting wheelchair out. C/o only of hip pain at dc.

## 2014-06-05 NOTE — ED Notes (Addendum)
Pt reports she has had periods of chest tightness over last few days and rt hip pain/swelling as well. Olga Millers PA aware

## 2014-06-05 NOTE — ED Notes (Signed)
Ambulated with pt to bathroom. Ambulated without assistance. Pt did try to avoid putting pressure on right hip.

## 2014-06-05 NOTE — ED Notes (Signed)
Pt reports was walking at her house yesterday and started having soreness in r hip.  Today says hip and r leg hurt.  Denies injury.

## 2014-06-07 NOTE — ED Provider Notes (Signed)
CSN: [id number]     Arrival date & time 06/05/14  8828 History   First MD Initiated Contact with Patient 06/05/14 1043     No chief complaint on file.    (Consider location/radiation/quality/duration/timing/severity/associated sxs/prior Treatment) The history is provided by the patient.   Hannah Cooper is a 59 y.o. female with a  History of HTN, CHF and fairly newly diagnosed COPD presenting with right lower posterior hip pain with radiation into her right lateral thigh.  Pain is worsened with movement and weight bearing and better at rest.  She denies injury or prior episodes of similar pain.  She feels swollen across her right upper buttock at the site of pain.  She also has complaints of intermittent chest tightness without pain or increased sob, although endorses chronic sob due to her chronic medical conditions. These symptoms are not new nor are they worsened today.  She last felt chest pressure early this am which improved with albuterol mdi treatment.  Sister at the bedside adds that patient is very anxious as they are coming up on the anniversary of the death of a sister and this has caused increased anxiety for her.  She denies increased peripheral edema.  She uses lasix daily and this has been stable.  Rest improves her back and leg pain. She denies weakness, numbness in the extremities. She has found no alleviators except for rest.     Past Medical History  Diagnosis Date  . Meningitis   . Hypertension   . Hyperlipidemia    Past Surgical History  Procedure Laterality Date  . Abdominal hysterectomy    . Thyroid surgery  2002  . Left heart catheterization with coronary angiogram N/A 09/05/2012    Procedure: LEFT HEART CATHETERIZATION WITH CORONARY ANGIOGRAM;  Surgeon: Wellington Hampshire, MD;  Location: [facility];  Service: Cardiovascular;  Laterality: N/A;   Family History  Problem Relation Age of Onset  . Heart attack Mother 18  . Heart attack Sister 75   History   Substance Use Topics  . Smoking status: Never Smoker   . Smokeless tobacco: Never Used  . Alcohol Use: No   OB History    Gravida Para Term Preterm AB TAB SAB Ectopic Multiple Living            0     Review of Systems  Constitutional: Negative for fever and chills.  Respiratory: Positive for chest tightness and shortness of breath.   Musculoskeletal: Positive for back pain and arthralgias. Negative for myalgias and joint swelling.  Skin: Negative for color change and rash.  Neurological: Negative for weakness and numbness.  Psychiatric/Behavioral: The patient is nervous/anxious.       Allergies  Other  Home Medications   Prior to Admission medications   Medication Sig Start Date End Date Taking? Authorizing Provider  albuterol (PROVENTIL HFA;VENTOLIN HFA) 108 (90 BASE) MCG/ACT inhaler Inhale 2 puffs into the lungs every 4 (four) hours as needed for wheezing or shortness of breath. 05/16/14   Lendon Colonel, NP  atorvastatin (LIPITOR) 20 MG tablet Take 20 mg by mouth daily.     Historical Provider, MD  cycloSPORINE (RESTASIS) 0.05 % ophthalmic emulsion Place 1 drop into both eyes 2 (two) times daily as needed (dry eyes).     Historical Provider, MD  furosemide (LASIX) 20 MG tablet Take 20 mg by mouth daily.    Historical Provider, MD  HYDROcodone-acetaminophen (NORCO/VICODIN) 5-325 MG per tablet Take 1 tablet by mouth every  4 (four) hours as needed. 06/05/14   Evalee Jefferson, PA-C  isosorbide mononitrate (IMDUR) 30 MG 24 hr tablet TAKE 1 TABLET (30 MG TOTAL) BY MOUTH DAILY. 12/11/13   Lendon Colonel, NP  lisinopril (PRINIVIL,ZESTRIL) 10 MG tablet Take 10 mg by mouth daily.    Historical Provider, MD  potassium chloride (MICRO-K) 10 MEQ CR capsule TAKE ONE CAPSULE BY MOUTH AS NEEDED 03/17/14   Lendon Colonel, NP  predniSONE (DELTASONE) 10 MG tablet 6, 5, 4, 3, 2 then 1 tablet by mouth daily for 6 days total. 06/05/14   Evalee Jefferson, PA-C   BP 173/81 mmHg  Pulse 111   Temp(Src) 98.9 F (37.2 C) (Oral)  Resp 16  Ht 5\' 7"  (1.702 m)  Wt 205 lb (92.987 kg)  BMI 32.10 kg/m2  SpO2 94% Physical Exam  Constitutional: She appears well-developed and well-nourished.  HENT:  Head: Normocephalic and atraumatic.  Eyes: Conjunctivae are normal.  Neck: Normal range of motion. Neck supple.  Cardiovascular: Normal rate and intact distal pulses.   Pulses:      Dorsalis pedis pulses are 2+ on the right side, and 2+ on the left side.  Pulses equal bilaterally  Pulmonary/Chest: Effort normal. No respiratory distress. She has no decreased breath sounds. She has no wheezes. She has no rhonchi.  Abdominal: Soft. Bowel sounds are normal. She exhibits no distension and no mass.  Musculoskeletal: Normal range of motion. She exhibits tenderness.       Lumbar back: She exhibits tenderness. She exhibits no bony tenderness, no swelling, no edema and no spasm.  No midline ttp.  Pain with palpation right posterior pelvic rim and superior buttock. No edema, no erythema or rash.  Trace bilateral ankle edema.  No ttp of calf or thigh. Legs are symmetric, no palpable cords.  Neurological: She is alert. She has normal strength. She displays no atrophy, no tremor and normal reflexes. No sensory deficit. Gait normal.  Reflex Scores:      Patellar reflexes are 2+ on the right side and 2+ on the left side.      Achilles reflexes are 2+ on the right side and 2+ on the left side. No strength deficit noted in hip and knee flexor and extensor muscle groups.  Ankle flexion and extension intact.  Skin: Skin is warm and dry.  Psychiatric: She has a normal mood and affect.  Nursing note and vitals reviewed.   ED Course  Procedures (including critical care time) Labs Review Labs Reviewed  CBC WITH DIFFERENTIAL/PLATELET - Abnormal; Notable for the following:    Hemoglobin 11.9 (*)    HCT 35.8 (*)    All other components within normal limits  BASIC METABOLIC PANEL - Abnormal; Notable for the  following:    GFR calc non Af Amer 73 (*)    GFR calc Af Amer 85 (*)    All other components within normal limits  TROPONIN I    Imaging Review No results found.   EKG Interpretation   Date/Time:  Thursday June 05 2014 12:49:17 EST Ventricular Rate:  63 PR Interval:  168 QRS Duration: 68 QT Interval:  412 QTC Calculation: 421 R Axis:   46 Text Interpretation:  Normal sinus rhythm Nonspecific T wave abnormality  Abnormal ECG no significant change since May 2015 Confirmed by Regenia Skeeter   MD, SCOTT ([id number]) on 06/05/2014 1:49:37 PM      MDM   Final diagnoses:  Sciatica, right  Simple chronic bronchitis    Exam  c/w right sciatica.  Labs and xrays reviewed.  Pt is mildly anxious today and is surrounded by several family members. Her anxiety seemed to worsen upon the arrival of additional family.   Her sob sx are not new or worsened today, exam, labs stable.  Review of chart reveals she had a clean cath in 2014.  No exam findings suggesting dvt.  Perc negative.    Patients labs and/or radiological studies were viewed and considered during the medical decision making and disposition process. Pt was prescribed hydrocodone and prednisone taper. Advised f/u with pcp if sx persist.  Heat tx.     Evalee Jefferson, PA-C 06/07/14 2139  Ephraim Hamburger, MD 06/12/14 [phone]

## 2014-06-13 ENCOUNTER — Other Ambulatory Visit: Payer: Self-pay

## 2014-06-13 DIAGNOSIS — R0602 Shortness of breath: Secondary | ICD-10-CM

## 2014-06-13 DIAGNOSIS — R000 Tachycardia, unspecified: Secondary | ICD-10-CM

## 2014-06-17 DIAGNOSIS — R000 Tachycardia, unspecified: Secondary | ICD-10-CM | POA: Diagnosis not present

## 2014-07-01 ENCOUNTER — Ambulatory Visit (HOSPITAL_COMMUNITY): Payer: Medicaid Other | Attending: Family Medicine

## 2014-07-01 ENCOUNTER — Other Ambulatory Visit (HOSPITAL_COMMUNITY): Payer: Self-pay | Admitting: Pulmonary Disease

## 2014-07-01 DIAGNOSIS — R0602 Shortness of breath: Secondary | ICD-10-CM

## 2014-07-04 ENCOUNTER — Telehealth: Payer: Self-pay

## 2014-07-04 NOTE — Telephone Encounter (Signed)
Thank you :)

## 2014-07-04 NOTE — Telephone Encounter (Signed)
Pt reports elevated BP,severe HA and face tingling.She is unable to see pcp,advised to go to ED for evaluation

## 2014-07-12 ENCOUNTER — Other Ambulatory Visit: Payer: Self-pay | Admitting: Adult Health

## 2014-07-17 ENCOUNTER — Other Ambulatory Visit (HOSPITAL_COMMUNITY): Payer: Self-pay | Admitting: Radiology

## 2014-07-17 DIAGNOSIS — G4730 Sleep apnea, unspecified: Secondary | ICD-10-CM

## 2014-07-17 DIAGNOSIS — R0602 Shortness of breath: Secondary | ICD-10-CM

## 2014-07-26 DIAGNOSIS — R0600 Dyspnea, unspecified: Secondary | ICD-10-CM | POA: Diagnosis not present

## 2014-08-06 ENCOUNTER — Ambulatory Visit: Payer: Medicaid Other | Attending: Pulmonary Disease | Admitting: Sleep Medicine

## 2014-08-06 DIAGNOSIS — G4730 Sleep apnea, unspecified: Secondary | ICD-10-CM | POA: Diagnosis present

## 2014-08-06 DIAGNOSIS — R0602 Shortness of breath: Secondary | ICD-10-CM | POA: Insufficient documentation

## 2014-08-09 NOTE — Sleep Study (Signed)
  [place] A. Merlene Laughter, MD     [email or web address]        NOCTURNAL POLYSOMNOGRAM    LOCATION: SLEEP LAB FACILITY: [facility]   PHYSICIAN: Nazim Kadlec A. Merlene Laughter, M.D.   DATE OF STUDY: 08/06/2014.   REFERRING PHYSICIAN: Sinda Du.   INDICATIONS: The patient is a 59 year old presents with significant snoring, fatigue and awakening with headaches.  MEDICATIONS:  Prior to Admission medications   Medication Sig Start Date End Date Taking? Authorizing Provider  albuterol (PROVENTIL HFA;VENTOLIN HFA) 108 (90 BASE) MCG/ACT inhaler Inhale 2 puffs into the lungs every 4 (four) hours as needed for wheezing or shortness of breath. 05/16/14   Lendon Colonel, NP  atorvastatin (LIPITOR) 20 MG tablet Take 20 mg by mouth daily.     Historical Provider, MD  cycloSPORINE (RESTASIS) 0.05 % ophthalmic emulsion Place 1 drop into both eyes 2 (two) times daily as needed (dry eyes).     Historical Provider, MD  furosemide (LASIX) 20 MG tablet Take 20 mg by mouth daily.    Historical Provider, MD  HYDROcodone-acetaminophen (NORCO/VICODIN) 5-325 MG per tablet Take 1 tablet by mouth every 4 (four) hours as needed. 06/05/14   Evalee Jefferson, PA-C  isosorbide mononitrate (IMDUR) 30 MG 24 hr tablet TAKE 1 TABLET (30 MG TOTAL) BY MOUTH DAILY. 12/11/13   Lendon Colonel, NP  lisinopril (PRINIVIL,ZESTRIL) 10 MG tablet Take 10 mg by mouth daily.    Historical Provider, MD  lisinopril (PRINIVIL,ZESTRIL) 10 MG tablet TAKE 1 TABLET (10 MG TOTAL) BY MOUTH DAILY. 07/14/14   Lendon Colonel, NP  potassium chloride (MICRO-K) 10 MEQ CR capsule TAKE ONE CAPSULE BY MOUTH AS NEEDED 03/17/14   Lendon Colonel, NP  predniSONE (DELTASONE) 10 MG tablet 6, 5, 4, 3, 2 then 1 tablet by mouth daily for 6 days total. 06/05/14   Evalee Jefferson, PA-C      EPWORTH SLEEPINESS SCALE: 16.   BMI: 34.   ARCHITECTURAL SUMMARY: Total recording time was 386 minutes. Sleep efficiency 87 %. Sleep latency 3 minutes. REM latency  142 minutes. Stage NI 8 %, N2 51 % and N3 1 % and REM sleep 40 %.    RESPIRATORY DATA:  Baseline oxygen saturation is 100 %. The lowest saturation is 90 %. The diagnostic AHI is 5. The RDI is 6. The REM AHI is 10.  LIMB MOVEMENT SUMMARY: PLM index 0.   ELECTROCARDIOGRAM SUMMARY: Average heart rate is 59 with no significant dysrhythmias observed.   IMPRESSION:  1. Mild obstructive sleep apnea syndrome worse in REM sleep not requiring positive pressure treatment. 2. Reduce slow-wave sleep/deep sleep.  Thanks for this referral.  Katharine Rochefort A. Merlene Laughter, M.D. Diplomat, Tax adviser of Sleep Medicine.

## 2014-08-10 ENCOUNTER — Other Ambulatory Visit: Payer: Self-pay | Admitting: Adult Health

## 2014-08-14 ENCOUNTER — Emergency Department (HOSPITAL_COMMUNITY): Payer: Medicaid Other

## 2014-08-14 ENCOUNTER — Emergency Department (HOSPITAL_COMMUNITY)
Admission: EM | Admit: 2014-08-14 | Discharge: 2014-08-14 | Disposition: A | Discharge status: Home / Self Care | Payer: Medicaid Other | Attending: Emergency Medicine | Admitting: Emergency Medicine

## 2014-08-14 ENCOUNTER — Encounter (HOSPITAL_COMMUNITY): Payer: Self-pay

## 2014-08-14 DIAGNOSIS — M1990 Unspecified osteoarthritis, unspecified site: Secondary | ICD-10-CM | POA: Insufficient documentation

## 2014-08-14 DIAGNOSIS — Z8661 Personal history of infections of the central nervous system: Secondary | ICD-10-CM | POA: Diagnosis not present

## 2014-08-14 DIAGNOSIS — R61 Generalized hyperhidrosis: Secondary | ICD-10-CM | POA: Diagnosis not present

## 2014-08-14 DIAGNOSIS — R072 Precordial pain: Secondary | ICD-10-CM | POA: Insufficient documentation

## 2014-08-14 DIAGNOSIS — J449 Chronic obstructive pulmonary disease, unspecified: Secondary | ICD-10-CM | POA: Diagnosis not present

## 2014-08-14 DIAGNOSIS — Z9889 Other specified postprocedural states: Secondary | ICD-10-CM | POA: Diagnosis not present

## 2014-08-14 DIAGNOSIS — Z79899 Other long term (current) drug therapy: Secondary | ICD-10-CM | POA: Diagnosis not present

## 2014-08-14 DIAGNOSIS — Z7952 Long term (current) use of systemic steroids: Secondary | ICD-10-CM | POA: Diagnosis not present

## 2014-08-14 DIAGNOSIS — I509 Heart failure, unspecified: Secondary | ICD-10-CM | POA: Insufficient documentation

## 2014-08-14 DIAGNOSIS — R0602 Shortness of breath: Secondary | ICD-10-CM | POA: Diagnosis present

## 2014-08-14 DIAGNOSIS — E785 Hyperlipidemia, unspecified: Secondary | ICD-10-CM | POA: Diagnosis not present

## 2014-08-14 DIAGNOSIS — R600 Localized edema: Secondary | ICD-10-CM | POA: Diagnosis not present

## 2014-08-14 DIAGNOSIS — I10 Essential (primary) hypertension: Secondary | ICD-10-CM | POA: Insufficient documentation

## 2014-08-14 DIAGNOSIS — R079 Chest pain, unspecified: Secondary | ICD-10-CM

## 2014-08-14 HISTORY — DX: Unspecified osteoarthritis, unspecified site: M19.90

## 2014-08-14 HISTORY — DX: Chronic obstructive pulmonary disease, unspecified: J44.9

## 2014-08-14 LAB — BASIC METABOLIC PANEL
Anion gap: 7 (ref 5–15)
BUN: 16 mg/dL (ref 6–23)
CHLORIDE: 108 mmol/L (ref 96–112)
CO2: 27 mmol/L (ref 19–32)
Calcium: 9.4 mg/dL (ref 8.4–10.5)
Creatinine, Ser: 0.94 mg/dL (ref 0.50–1.10)
GFR calc non Af Amer: 65 mL/min — ABNORMAL LOW (ref >90)
GFR, EST AFRICAN AMERICAN: 75 mL/min — AB (ref >90)
Glucose, Bld: 88 mg/dL (ref 70–99)
Potassium: 3.6 mmol/L (ref 3.5–5.1)
SODIUM: 142 mmol/L (ref 135–145)

## 2014-08-14 LAB — CBC WITH DIFFERENTIAL/PLATELET
BASOS ABS: 0 10*3/uL (ref 0.0–0.1)
BASOS PCT: 0 % (ref 0–1)
EOS PCT: 3 % (ref 0–5)
Eosinophils Absolute: 0.1 10*3/uL (ref 0.0–0.7)
HEMATOCRIT: 37 % (ref 36.0–46.0)
HEMOGLOBIN: 12.3 g/dL (ref 12.0–15.0)
LYMPHS ABS: 1.7 10*3/uL (ref 0.7–4.0)
LYMPHS PCT: 45 % (ref 12–46)
MCH: 29.5 pg (ref 26.0–34.0)
MCHC: 33.2 g/dL (ref 30.0–36.0)
MCV: 88.7 fL (ref 78.0–100.0)
MONO ABS: 0.3 10*3/uL (ref 0.1–1.0)
MONOS PCT: 8 % (ref 3–12)
Neutro Abs: 1.6 10*3/uL — ABNORMAL LOW (ref 1.7–7.7)
Neutrophils Relative %: 44 % (ref 43–77)
PLATELETS: 252 10*3/uL (ref 150–400)
RBC: 4.17 MIL/uL (ref 3.87–5.11)
RDW: 13.5 % (ref 11.5–15.5)
WBC: 3.7 10*3/uL — AB (ref 4.0–10.5)

## 2014-08-14 LAB — D-DIMER, QUANTITATIVE (NOT AT ARMC): D-Dimer, Quant: 4.51 ug/mL-FEU — ABNORMAL HIGH (ref 0.00–0.48)

## 2014-08-14 LAB — TROPONIN I: Troponin I: <0.03 ng/mL (ref <0.031)

## 2014-08-14 LAB — BRAIN NATRIURETIC PEPTIDE: B NATRIURETIC PEPTIDE 5: 26 pg/mL (ref 0.0–100.0)

## 2014-08-14 MED ORDER — IOHEXOL 350 MG/ML SOLN
100.0000 mL | Freq: Once | INTRAVENOUS | Status: AC | PRN
  Administered 2014-08-14: 100 mL via INTRAVENOUS

## 2014-08-14 NOTE — ED Notes (Signed)
Pt reports fullness in chest, chest pressure, worsening swelling in both feet and lower legs, and cough.  Reports history of CHF.

## 2014-08-14 NOTE — ED Notes (Signed)
Patient with no complaints at this time. Respirations even and unlabored. Skin warm/dry. Discharge instructions reviewed with patient at this time. Patient given opportunity to voice concerns/ask questions. IV removed per policy and band-aid applied to site. Patient discharged at this time and left Emergency Department with steady gait.  

## 2014-08-14 NOTE — Discharge Instructions (Signed)
Tests showed no evidence of a blood clot in your lungs. Recommend follow-up with Dr. Luan Pulling. You may need to follow-up for your thyroid nodule. Discussed with your primary care physician.

## 2014-08-14 NOTE — ED Provider Notes (Signed)
CSN: [id number]     Arrival date & time 08/14/14  1004 History  This chart was scribed for Hannah Christen, MD by Randa Evens, ED Scribe. This patient was seen in room APA05/APA05 and the patient's care was started at 11:09 PM.      Chief Complaint  Patient presents with  . Shortness of Breath   Patient is a 59 y.o. female presenting with shortness of breath. The history is provided by the patient. No language interpreter was used.  Shortness of Breath Associated symptoms: chest pain and diaphoresis   Associated symptoms: no fever    HPI Comments: Hannah Cooper is a 59 y.o. female with PMHx of HTN, CHF and COPD who presents to the Emergency Department complaining of constant waxing and waning central CP onset 2-4 days ago described as something sitting on her chest. Pt states that she will take her 4 breathing treatments a day for her COPD that will provide slight relief. Pt reports having SOB and diaphoresis that's more than normal. Pt states that when walking her shortness of breath is worse. Pt reports swelling in her legs as well that's worse than normal. Pt doesn't report any other symptoms.   Past Medical History  Diagnosis Date  . Meningitis   . Hypertension   . Hyperlipidemia   . CHF (congestive heart failure)   . Arthritis   . COPD (chronic obstructive pulmonary disease)    Past Surgical History  Procedure Laterality Date  . Abdominal hysterectomy    . Thyroid surgery  2002  . Left heart catheterization with coronary angiogram N/A 09/05/2012    Procedure: LEFT HEART CATHETERIZATION WITH CORONARY ANGIOGRAM;  Surgeon: Wellington Hampshire, MD;  Location: [facility];  Service: Cardiovascular;  Laterality: N/A;   Family History  Problem Relation Age of Onset  . Heart attack Mother 39  . Heart attack Sister 45   History  Substance Use Topics  . Smoking status: Never Smoker   . Smokeless tobacco: Never Used  . Alcohol Use: No   OB History    Gravida Para Term Preterm AB  TAB SAB Ectopic Multiple Living            0     Review of Systems  Constitutional: Positive for diaphoresis. Negative for fever and chills.  Respiratory: Positive for shortness of breath.   Cardiovascular: Positive for chest pain and leg swelling.  All other systems reviewed and are negative.     Allergies  Other  Home Medications   Prior to Admission medications   Medication Sig Start Date End Date Taking? Authorizing Provider  albuterol (PROVENTIL HFA;VENTOLIN HFA) 108 (90 BASE) MCG/ACT inhaler Inhale 2 puffs into the lungs every 4 (four) hours as needed for wheezing or shortness of breath. 05/16/14  Yes Lendon Colonel, NP  albuterol (PROVENTIL) (2.5 MG/3ML) 0.083% nebulizer solution Take 2.5 mg by nebulization every 6 (six) hours as needed for wheezing or shortness of breath.   Yes Historical Provider, MD  ANORO ELLIPTA 62.5-25 MCG/INH AEPB Inhale 1 puff into the lungs daily. 05/27/14  Yes Historical Provider, MD  atorvastatin (LIPITOR) 20 MG tablet Take 20 mg by mouth daily.    Yes Historical Provider, MD  cycloSPORINE (RESTASIS) 0.05 % ophthalmic emulsion Place 1 drop into both eyes 2 (two) times daily.    Yes Historical Provider, MD  famotidine (PEPCID) 40 MG tablet Take 40 mg by mouth 2 (two) times daily as needed (TAKE 15 MINUTES BEFORE TAKING IBUPROFEN.).  08/01/14  Yes Historical Provider, MD  fluorometholone (FML) 0.1 % ophthalmic suspension Place 1 drop into both eyes 2 (two) times daily. Use 15 minutes before using restasis.   Yes Historical Provider, MD  furosemide (LASIX) 20 MG tablet Take 20 mg by mouth daily.   Yes Historical Provider, MD  HYDROcodone-acetaminophen (NORCO/VICODIN) 5-325 MG per tablet Take 1 tablet by mouth every 4 (four) hours as needed. Patient taking differently: Take 1 tablet by mouth every 4 (four) hours as needed for moderate pain.  06/05/14  Yes Evalee Jefferson, PA-C  ibuprofen (ADVIL,MOTRIN) 800 MG tablet Take 1 tablet by mouth 2 (two) times daily as  needed (BACK PAIN).  08/01/14  Yes Historical Provider, MD  isosorbide mononitrate (IMDUR) 30 MG 24 hr tablet TAKE 1 TABLET (30 MG TOTAL) BY MOUTH DAILY. 08/11/14  Yes Lendon Colonel, NP  lisinopril (PRINIVIL,ZESTRIL) 10 MG tablet TAKE 1 TABLET (10 MG TOTAL) BY MOUTH DAILY. 07/14/14  Yes Lendon Colonel, NP  potassium chloride (MICRO-K) 10 MEQ CR capsule TAKE ONE CAPSULE BY MOUTH AS NEEDED Patient taking differently: TAKE ONE CAPSULE BY MOUTH AS NEEDED FOR FLUID. (TAKES WITH FUROSEMIDE). 03/17/14  Yes Lendon Colonel, NP  predniSONE (DELTASONE) 10 MG tablet 6, 5, 4, 3, 2 then 1 tablet by mouth daily for 6 days total. Patient not taking: Reported on 08/14/2014 06/05/14   Evalee Jefferson, PA-C   BP 158/69 mmHg  Pulse 73  Temp(Src) 98.2 F (36.8 C) (Oral)  Resp 16  Ht 5\' 7"  (1.702 m)  Wt 200 lb (90.719 kg)  BMI 31.32 kg/m2  SpO2 95%   Physical Exam  Constitutional: She is oriented to person, place, and time. She appears well-developed and well-nourished.  HENT:  Head: Normocephalic and atraumatic.  Eyes: Conjunctivae and EOM are normal. Pupils are equal, round, and reactive to light.  Neck: Normal range of motion. Neck supple.  Cardiovascular: Normal rate and regular rhythm.   Pulmonary/Chest: Effort normal and breath sounds normal. She exhibits tenderness.  TTP on sternum.   Abdominal: Soft. Bowel sounds are normal.  Musculoskeletal: Normal range of motion. She exhibits edema.  2+ peripheral edema.   Neurological: She is alert and oriented to person, place, and time.  Skin: Skin is warm and dry.  Psychiatric: She has a normal mood and affect. Her behavior is normal.  Nursing note and vitals reviewed.   ED Course  Procedures (including critical care time) DIAGNOSTIC STUDIES: Oxygen Saturation is 100% on 1L [place], normal by my interpretation.    COORDINATION OF CARE: 12:06 PM-Discussed treatment plan with pt at bedside and pt agreed to plan.     Labs Review Labs Reviewed  CBC  WITH DIFFERENTIAL/PLATELET - Abnormal; Notable for the following:    WBC 3.7 (*)    Neutro Abs 1.6 (*)    All other components within normal limits  BASIC METABOLIC PANEL - Abnormal; Notable for the following:    GFR calc non Af Amer 65 (*)    GFR calc Af Amer 75 (*)    All other components within normal limits  D-DIMER, QUANTITATIVE - Abnormal; Notable for the following:    D-Dimer, Quant 4.51 (*)    All other components within normal limits  TROPONIN I  BRAIN NATRIURETIC PEPTIDE    Imaging Review Dg Chest 2 View  08/14/2014   CLINICAL DATA:  Short of breath and cough  EXAM: CHEST  2 VIEW  COMPARISON:  06/05/2014  FINDINGS: Heart size and vascularity normal. Negative  for heart failure or effusion. Mild bibasilar atelectasis. Negative for pneumonia.  IMPRESSION: Mild bibasilar atelectasis.   Electronically Signed   By: Franchot Gallo M.D.   On: 08/14/2014 11:23   Ct Angio Chest Pe W/cm &/or Wo Cm  08/14/2014   CLINICAL DATA:  Persistent waxing and waning central chest pain that began approximately 4 days ago, described as "Something sitting on my chest." Current history of COPD. Shortness of breath with walking. Recently increasing lower extremity edema.  EXAM: CT ANGIOGRAPHY CHEST WITH CONTRAST  TECHNIQUE: Multidetector CT imaging of the chest was performed using the standard protocol during bolus administration of intravenous contrast. Multiplanar CT image reconstructions and MIPs were obtained to evaluate the vascular anatomy.  CONTRAST:  173mL OMNIPAQUE IOHEXOL 350 MG/ML IV.  COMPARISON:  07/04/2013.  FINDINGS: Contrast opacification of pulmonary arteries is very good. Respiratory motion blurs images of the mid and lower lungs. Overall, the study is of good diagnostic quality.  No filling defects within either main pulmonary artery or their branches in either lung to suggest pulmonary embolism. Heart size upper normal. No visible coronary atherosclerosis. No pericardial effusion. No visible  atherosclerosis involving the thoracic or upper abdominal aorta or their visualized branches.  Calcified granuloma in the posterolateral right lower lobe. Expected dependent atelectasis posteriorly in the lower lobes. Lungs otherwise clear without confluent airspace consolidation, interstitial disease, or noncalcified nodules. No pleural effusions. Central airways patent with moderate central bronchial wall thickening.  No pathologic hilar, mediastinal or axillary lymphadenopathy. Approximate 2.3 x 2.0 x 2.3 cm nodule arising from the expected position of the lower pole of the right lobe of the thyroid gland in this patient with at least partial thyroidectomy.  Diffuse steatosis involving the visualized liver. Visualized upper abdomen otherwise unremarkable. Bone window images demonstrate mild diffuse thoracic degenerative disc disease and spondylosis.  Review of the MIP images confirms the above findings.  IMPRESSION: 1. No evidence of pulmonary embolism. 2. Central bronchial wall thickening consistent with asthma and/or bronchitis. No acute cardiopulmonary disease otherwise. 3. Approximate 2.3 cm nodule in the expected position of the lower pole of the right lobe of the thyroid gland. The patient has had at least partial thyroidectomy. If the patient had total thyroidectomy for cancer, this is suspicious for recurrent disease. If the patient had a left hemithyroidectomy, then this is consistent with a thyroid nodule and followup evaluation in the outpatient setting with ultrasound is suggested. This follows ACR consensus guidelines: Managing Incidental Thyroid Nodules Detected on Imaging: White Paper of the ACR Incidental Thyroid Findings Committee. J Am Coll Radiol 2015; 12:143-150. 4. Diffuse steatosis involving the visualized liver.   Electronically Signed   By: Evangeline Dakin M.D.   On: 08/14/2014 15:45     EKG Interpretation   Date/Time:  Thursday August 14 2014 10:26:56 EDT Ventricular Rate:  65 PR  Interval:  168 QRS Duration: 72 QT Interval:  486 QTC Calculation: 505 R Axis:   57 Text Interpretation:  Sinus rhythm Borderline T abnormalities, anterior  leads Borderline prolonged QT interval Baseline wander in lead(s) II III  aVL aVF Confirmed by Janaiya Beauchesne  MD, Saraiyah Hemminger ([id number]) on 08/14/2014 10:33:47 AM      MDM   Final diagnoses:  Chest pain, unspecified chest pain type   Patient is hemodynamically stable. EKG negative. Troponin negative. D-dimer elevated. No leg pain. No leg pain. CT angios chest shows no pulmonary embolism. Abnormal pathology noted on thyroid gland. This was discussed with the patient. She has had previous  thyroid surgery. She will follow-up with her primary care doctor in regards to this issue. Discussed test results with patient and her husband.   I personally performed the services described in this documentation, which was scribed in my presence. The recorded information has been reviewed and is accurate.      Hannah Christen, MD 08/15/14 [phone]

## 2014-08-14 NOTE — ED Notes (Signed)
MD Cook at bedside. 

## 2014-08-20 ENCOUNTER — Encounter: Payer: Self-pay | Admitting: Physician Assistant

## 2014-08-20 ENCOUNTER — Ambulatory Visit (INDEPENDENT_AMBULATORY_CARE_PROVIDER_SITE_OTHER): Payer: Medicaid Other | Admitting: Physician Assistant

## 2014-08-20 VITALS — BP 162/94 | HR 69 | Ht 68.0 in | Wt 220.0 lb

## 2014-08-20 DIAGNOSIS — R079 Chest pain, unspecified: Secondary | ICD-10-CM | POA: Diagnosis not present

## 2014-08-20 DIAGNOSIS — I10 Essential (primary) hypertension: Secondary | ICD-10-CM | POA: Diagnosis not present

## 2014-08-20 DIAGNOSIS — J449 Chronic obstructive pulmonary disease, unspecified: Secondary | ICD-10-CM | POA: Insufficient documentation

## 2014-08-20 DIAGNOSIS — I5032 Chronic diastolic (congestive) heart failure: Secondary | ICD-10-CM | POA: Diagnosis not present

## 2014-08-20 MED ORDER — LISINOPRIL 20 MG PO TABS: 20.0000 mg | ORAL_TABLET | Freq: Every day | ORAL | Status: DC

## 2014-08-20 MED ORDER — POTASSIUM CHLORIDE CRYS ER 20 MEQ PO TBCR: 20.0000 meq | EXTENDED_RELEASE_TABLET | Freq: Every day | ORAL | Status: DC

## 2014-08-20 MED ORDER — FUROSEMIDE 40 MG PO TABS: 40.0000 mg | ORAL_TABLET | Freq: Every day | ORAL | Status: DC

## 2014-08-20 NOTE — Assessment & Plan Note (Signed)
Patient has acute on chronic diastolic heart failure. Will increase Lasix to 40 mg once daily. Increase potassium to 20 mEq daily. Her blood pressure is also not controlled. Increase lisinopril to 20 mg daily. Check bmet in 2 weeks and see her back in 2 weeks.

## 2014-08-20 NOTE — Assessment & Plan Note (Signed)
Recent emergency room visit with chest pain in the setting of bronchitis and COPD exacerbation. Troponins EKGs negative CT negative for PE. No further workup.

## 2014-08-20 NOTE — Patient Instructions (Signed)
Your physician recommends that you schedule a follow-up appointment in 2 weeks    INCREASE Lasix to 40 mg daily  INCREASE Potassium to 20 meq daily  INCREASE lisinopril to 20 mg daily   Blood work in 2 weeks   Thank you for choosing [facility] !

## 2014-08-20 NOTE — Assessment & Plan Note (Signed)
Uncontrolled. Increase lisinopril to 20 mg daily. 2 g sodium diet.

## 2014-08-20 NOTE — Assessment & Plan Note (Signed)
Managed by Dr. Luan Pulling. Currently on antibiotics and steroids.

## 2014-08-20 NOTE — Progress Notes (Signed)
Cardiology Office Note   Date:  08/20/2014   ID:  Hannah Cooper, DOB January 05, 1956, MRN [id number]  PCP:  Maggie Font, MD  Cardiologist:  Dr. Carlyle Dolly Pulmonologist Dr. Luan Pulling  Chief Complaint: fluid buildup    History of Present Illness: Hannah Cooper is a 59 y.o. female who presents for follow-up of an emergency room visit. She has history of chest pain with normal cardiac catheterization in 2014. She has history of COPD, hypertension, diastolic heart failure. She was recently in the emergency room with worsening shortness of breath and had some chest pain. EKG with negative troponins negative d-dimer elevated CT angio no pulmonary embolus. BNP 26. She was then seen by Dr. Luan Pulling who treated her with steroids and an antibiotic for COPD exacerbation. She was referred here because of worsening dyspnea on exertion and fluid retention  Patient complains of increased lower extremity edema and dyspnea on exertion. Occasionally she'll take an extra Lasix but it doesn't always help. Her blood pressure has also been elevated. She doesn't always follow low sodium diet but tries. 2-D echo in 06/2013 showed normal LV function EF 55-60% with mild to moderate LVH, diastolic dysfunction was noted but grade indeterminate.   Past Medical History  Diagnosis Date  . Meningitis   . Hypertension   . Hyperlipidemia   . CHF (congestive heart failure)   . Arthritis   . COPD (chronic obstructive pulmonary disease)     Past Surgical History  Procedure Laterality Date  . Abdominal hysterectomy    . Thyroid surgery  2002  . Left heart catheterization with coronary angiogram N/A 09/05/2012    Procedure: LEFT HEART CATHETERIZATION WITH CORONARY ANGIOGRAM;  Surgeon: Wellington Hampshire, MD;  Location: [facility];  Service: Cardiovascular;  Laterality: N/A;     Current Outpatient Prescriptions  Medication Sig Dispense Refill  . albuterol (PROVENTIL HFA;VENTOLIN HFA) 108 (90 BASE) MCG/ACT  inhaler Inhale 2 puffs into the lungs every 4 (four) hours as needed for wheezing or shortness of breath. 1 Inhaler 2  . albuterol (PROVENTIL) (2.5 MG/3ML) 0.083% nebulizer solution Take 2.5 mg by nebulization every 6 (six) hours as needed for wheezing or shortness of breath.    Jearl Klinefelter ELLIPTA 62.5-25 MCG/INH AEPB Inhale 1 puff into the lungs daily.  0  . atorvastatin (LIPITOR) 20 MG tablet Take 20 mg by mouth daily.     . cycloSPORINE (RESTASIS) 0.05 % ophthalmic emulsion Place 1 drop into both eyes 2 (two) times daily.     . famotidine (PEPCID) 40 MG tablet Take 40 mg by mouth 2 (two) times daily as needed (TAKE 15 MINUTES BEFORE TAKING IBUPROFEN.).   2  . fluorometholone (FML) 0.1 % ophthalmic suspension Place 1 drop into both eyes 2 (two) times daily. Use 15 minutes before using restasis.    . furosemide (LASIX) 20 MG tablet Take 20 mg by mouth daily.    Marland Kitchen HYDROcodone-acetaminophen (NORCO/VICODIN) 5-325 MG per tablet Take 1 tablet by mouth every 4 (four) hours as needed. (Patient taking differently: Take 1 tablet by mouth every 4 (four) hours as needed for moderate pain. ) 20 tablet 0  . ibuprofen (ADVIL,MOTRIN) 800 MG tablet Take 1 tablet by mouth 2 (two) times daily as needed (BACK PAIN).   2  . isosorbide mononitrate (IMDUR) 30 MG 24 hr tablet TAKE 1 TABLET (30 MG TOTAL) BY MOUTH DAILY. 90 tablet 1  . lisinopril (PRINIVIL,ZESTRIL) 10 MG tablet TAKE 1 TABLET (10 MG TOTAL) BY MOUTH DAILY.  30 tablet 6  . potassium chloride (MICRO-K) 10 MEQ CR capsule TAKE ONE CAPSULE BY MOUTH AS NEEDED (Patient taking differently: daily) 90 capsule 2  . predniSONE (DELTASONE) 10 MG tablet 6, 5, 4, 3, 2 then 1 tablet by mouth daily for 6 days total. 21 tablet 0   No current facility-administered medications for this visit.    Allergies:   Other    Social History:  The patient  reports that she has never smoked. She has never used smokeless tobacco. She reports that she does not drink alcohol or use illicit  drugs.   Family History:  The patient's    family history includes Heart attack (age of onset: 67) in her sister; Heart attack (age of onset: 35) in her mother.    ROS:  Please see the history of present illness.   Otherwise, review of systems are positive for none.   All other systems are reviewed and negative.    PHYSICAL EXAM: VS:  BP 162/94 mmHg  Pulse 69  Ht 5\' 8"  (1.727 m)  Wt 220 lb (99.791 kg)  BMI 33.46 kg/m2  SpO2 97% , BMI Body mass index is 33.46 kg/(m^2). GEN: Obese, well developed, in no acute distress Neck: increased JVD, HJR, nocarotid bruits, or masses Cardiac: RRR; distant heart sounds, no murmurs,gallop, rubs, thrill or heave,  Respiratory:  Decreased breath sounds but clear to auscultation bilaterally, normal work of breathing GI: soft, nontender, nondistended, + BS MS: no deformity or atrophy Extremities: +1-2 edema bilaterally,without cyanosis, clubbing, good distal pulses bilaterally.  Skin: warm and dry, no rash Neuro:  Strength and sensation are intact    EKG:  EKG is not ordered today.    Recent Labs: 11/15/2013: ALT 40* 08/14/2014: B Natriuretic Peptide 26.0; BUN 16; Creatinine 0.94; Hemoglobin 12.3; Platelets 252; Potassium 3.6; Sodium 142    Lipid Panel    Component Value Date/Time   CHOL 102 07/05/2013 0537   TRIG 97 07/05/2013 0537   HDL 46 07/05/2013 0537   CHOLHDL 2.2 07/05/2013 0537   VLDL 19 07/05/2013 0537   LDLCALC 37 07/05/2013 0537      Wt Readings from Last 3 Encounters:  08/20/14 220 lb (99.791 kg)  08/14/14 200 lb (90.719 kg)  08/06/14 218 lb (98.884 kg)      Other studies Reviewed: Additional studies/ records that were reviewed today include and review of the records demonstrates: 2-D echo 06/2013 Study Conclusions  - Procedure narrative: Transthoracic echocardiography. Image   quality was suboptimal. The study was technically   difficult, as a result of poor sound wave transmission and   restricted patient  mobility. - Left ventricle: The cavity size was normal. Wall thickness   was increased in a pattern of mild to moderate LVH.   Diastolic dysfunction noted, grade indeterminant. Systolic   function was normal. The estimated ejection fraction was   in the range of 55% to 60%. Wall motion was normal; there   were no regional wall motion abnormalities.    ASSESSMENT AND PLAN: Chronic diastolic CHF (congestive heart failure) Patient has acute on chronic diastolic heart failure. Will increase Lasix to 40 mg once daily. Increase potassium to 20 mEq daily. Her blood pressure is also not controlled. Increase lisinopril to 20 mg daily. Check bmet in 2 weeks and see her back in 2 weeks.   Essential hypertension Uncontrolled. Increase lisinopril to 20 mg daily. 2 g sodium diet.   Chest pain Recent emergency room visit with chest pain in  the setting of bronchitis and COPD exacerbation. Troponins EKGs negative CT negative for PE. No further workup.   COPD (chronic obstructive pulmonary disease) Managed by Dr. Luan Pulling. Currently on antibiotics and steroids.      Sumner Boast, PA-C  08/20/2014 11:30 AM    [facility] [address] [place], [place], [place]  [address] Phone: [phone]; Fax: [phone]

## 2014-08-21 ENCOUNTER — Ambulatory Visit: Payer: Medicaid Other | Admitting: Adult Health

## 2014-08-28 ENCOUNTER — Other Ambulatory Visit (HOSPITAL_COMMUNITY): Payer: Self-pay | Admitting: Pulmonary Disease

## 2014-08-28 DIAGNOSIS — E041 Nontoxic single thyroid nodule: Secondary | ICD-10-CM

## 2014-09-01 ENCOUNTER — Ambulatory Visit (HOSPITAL_COMMUNITY)
Admission: RE | Admit: 2014-09-01 | Discharge: 2014-09-01 | Disposition: A | Discharge status: Home / Self Care | Payer: Medicaid Other | Source: Ambulatory Visit | Attending: Pulmonary Disease | Admitting: Pulmonary Disease

## 2014-09-01 DIAGNOSIS — E041 Nontoxic single thyroid nodule: Secondary | ICD-10-CM | POA: Diagnosis present

## 2014-09-03 ENCOUNTER — Encounter: Payer: Self-pay | Admitting: Physician Assistant

## 2014-09-03 ENCOUNTER — Encounter: Payer: Medicaid Other | Admitting: Cardiology

## 2014-09-03 NOTE — Progress Notes (Signed)
Clinical Summary [name] is a 59 y.o.female last seen by PA Lenze, this is our first visit together. She is seen for the following medical problems.  1. Chest pain - cath 2014 with patent coronaries.   2. COPD  3. HTN  4. Chronic diastolic heart failure - echo 06/2013 LVEF 55-60%, abnormal diastolic function - seen by PA Lenze 08/20/14  Past Medical History  Diagnosis Date  . Meningitis   . Hypertension   . Hyperlipidemia   . CHF (congestive heart failure)   . Arthritis   . COPD (chronic obstructive pulmonary disease)      Allergies  Allergen Reactions  . Other Swelling    Avon lipstick     Current Outpatient Prescriptions  Medication Sig Dispense Refill  . albuterol (PROVENTIL HFA;VENTOLIN HFA) 108 (90 BASE) MCG/ACT inhaler Inhale 2 puffs into the lungs every 4 (four) hours as needed for wheezing or shortness of breath. 1 Inhaler 2  . albuterol (PROVENTIL) (2.5 MG/3ML) 0.083% nebulizer solution Take 2.5 mg by nebulization every 6 (six) hours as needed for wheezing or shortness of breath.    Hannah Cooper ELLIPTA 62.5-25 MCG/INH AEPB Inhale 1 puff into the lungs daily.  0  . atorvastatin (LIPITOR) 20 MG tablet Take 20 mg by mouth daily.     . cycloSPORINE (RESTASIS) 0.05 % ophthalmic emulsion Place 1 drop into both eyes 2 (two) times daily.     . famotidine (PEPCID) 40 MG tablet Take 40 mg by mouth 2 (two) times daily as needed (TAKE 15 MINUTES BEFORE TAKING IBUPROFEN.).   2  . fluorometholone (FML) 0.1 % ophthalmic suspension Place 1 drop into both eyes 2 (two) times daily. Use 15 minutes before using restasis.    . furosemide (LASIX) 40 MG tablet Take 1 tablet (40 mg total) by mouth daily. 90 tablet 3  . HYDROcodone-acetaminophen (NORCO/VICODIN) 5-325 MG per tablet Take 1 tablet by mouth every 4 (four) hours as needed. (Patient taking differently: Take 1 tablet by mouth every 4 (four) hours as needed for moderate pain. ) 20 tablet 0  . ibuprofen (ADVIL,MOTRIN) 800  MG tablet Take 1 tablet by mouth 2 (two) times daily as needed (BACK PAIN).   2  . isosorbide mononitrate (IMDUR) 30 MG 24 hr tablet TAKE 1 TABLET (30 MG TOTAL) BY MOUTH DAILY. 90 tablet 1  . lisinopril (PRINIVIL,ZESTRIL) 20 MG tablet Take 1 tablet (20 mg total) by mouth daily. 90 tablet 3  . potassium chloride SA (KLOR-CON M20) 20 MEQ tablet Take 1 tablet (20 mEq total) by mouth daily. 90 tablet 3  . predniSONE (DELTASONE) 10 MG tablet 6, 5, 4, 3, 2 then 1 tablet by mouth daily for 6 days total. 21 tablet 0   No current facility-administered medications for this visit.     Past Surgical History  Procedure Laterality Date  . Abdominal hysterectomy    . Thyroid surgery  2002  . Left heart catheterization with coronary angiogram N/A 09/05/2012    Procedure: LEFT HEART CATHETERIZATION WITH CORONARY ANGIOGRAM;  Surgeon: Wellington Hampshire, MD;  Location: [facility];  Service: Cardiovascular;  Laterality: N/A;     Allergies  Allergen Reactions  . Other Swelling    Avon lipstick      Family History  Problem Relation Age of Onset  . Heart attack Mother 69  . Heart attack Sister 73     Social History [name] reports that she has never smoked. She has never used  smokeless tobacco. [name] reports that she does not drink alcohol.   Review of Systems CONSTITUTIONAL: No weight loss, fever, chills, weakness or fatigue.  HEENT: Eyes: No visual loss, blurred vision, double vision or yellow sclerae.No hearing loss, sneezing, congestion, runny nose or sore throat.  SKIN: No rash or itching.  CARDIOVASCULAR:  RESPIRATORY: No shortness of breath, cough or sputum.  GASTROINTESTINAL: No anorexia, nausea, vomiting or diarrhea. No abdominal pain or blood.  GENITOURINARY: No burning on urination, no polyuria NEUROLOGICAL: No headache, dizziness, syncope, paralysis, ataxia, numbness or tingling in the extremities. No change in bowel or bladder control.  MUSCULOSKELETAL: No muscle,  back pain, joint pain or stiffness.  LYMPHATICS: No enlarged nodes. No history of splenectomy.  PSYCHIATRIC: No history of depression or anxiety.  ENDOCRINOLOGIC: No reports of sweating, cold or heat intolerance. No polyuria or polydipsia.  Marland Kitchen   Physical Examination There were no vitals filed for this visit. There were no vitals filed for this visit.  Gen: resting comfortably, no acute distress HEENT: no scleral icterus, pupils equal round and reactive, no palptable cervical adenopathy,  CV Resp: Clear to auscultation bilaterally GI: abdomen is soft, non-tender, non-distended, normal bowel sounds, no hepatosplenomegaly MSK: extremities are warm, no edema.  Skin: warm, no rash Neuro:  no focal deficits Psych: appropriate affect   Diagnostic Studies  08/2012 cath Hemodynamics: AO: 164/82 mmHg LV: 169/14 mmHg LVEDP: 24 mmHg  Coronary angiography: Coronary dominance: Right   Left Main: Normal  Left Anterior Descending (LAD): Normal in size with no significant disease.  1st diagonal (D1): Small in size with minor irregularities.  2nd diagonal (D2): Normal in size with no significant disease.  3rd diagonal (D3): Normal in size with no significant disease.  Circumflex (LCx): Normal in size and nondominant. The vessel has no significant disease.  1st obtuse marginal: Small in size with no significant disease.  2nd obtuse marginal: Small in size with no significant disease.  3rd obtuse marginal: Large in size with no significant disease.  Right Coronary Artery: Normal in size and dominant. The vessel has no significant disease.  posterior descending artery: Normal in size with no significant disease.  posterior lateral branchs: Normal in size with no significant disease.  Left ventriculography: Left ventricular systolic function is normal , LVEF is estimated at 60-65% %, there is no significant mitral regurgitation   Final Conclusions:  1.  Normal coronary arteries. 2. Normal LV systolic function. 3. Moderately elevated left ventricular end-diastolic pressure likely due to diastolic heart failure.   06/2013 echo Study Conclusions  - Procedure narrative: Transthoracic echocardiography. Image quality was suboptimal. The study was technically difficult, as a result of poor sound wave transmission and restricted patient mobility. - Left ventricle: The cavity size was normal. Wall thickness was increased in a pattern of mild to moderate LVH. Diastolic dysfunction noted, grade indeterminant. Systolic function was normal. The estimated ejection fraction was in the range of 55% to 60%. Wall motion was normal; there were no regional wall motion abnormalities.    Assessment and Plan        Arnoldo Lenis, M.D., F.A.C.C.

## 2014-09-12 ENCOUNTER — Other Ambulatory Visit (HOSPITAL_COMMUNITY): Payer: Self-pay | Admitting: Pulmonary Disease

## 2014-09-12 DIAGNOSIS — E042 Nontoxic multinodular goiter: Secondary | ICD-10-CM

## 2014-09-16 ENCOUNTER — Ambulatory Visit (HOSPITAL_COMMUNITY)
Admission: RE | Admit: 2014-09-16 | Discharge: 2014-09-16 | Disposition: A | Discharge status: Home / Self Care | Payer: Medicaid Other | Source: Ambulatory Visit | Attending: Pulmonary Disease | Admitting: Pulmonary Disease

## 2014-09-16 ENCOUNTER — Encounter (HOSPITAL_COMMUNITY): Payer: Self-pay

## 2014-09-16 DIAGNOSIS — E042 Nontoxic multinodular goiter: Secondary | ICD-10-CM | POA: Diagnosis not present

## 2014-09-16 MED ORDER — SODIUM PERTECHNETATE TC 99M INJECTION
10.0000 | Freq: Once | INTRAVENOUS | Status: AC | PRN
  Administered 2014-09-16: 10.7 via INTRAVENOUS

## 2014-10-17 ENCOUNTER — Ambulatory Visit (INDEPENDENT_AMBULATORY_CARE_PROVIDER_SITE_OTHER): Payer: Medicaid Other | Admitting: Adult Health

## 2014-10-17 ENCOUNTER — Encounter: Payer: Self-pay | Admitting: Adult Health

## 2014-10-17 VITALS — BP 148/88 | HR 80 | Ht 68.0 in | Wt 218.0 lb

## 2014-10-17 DIAGNOSIS — I10 Essential (primary) hypertension: Secondary | ICD-10-CM | POA: Diagnosis not present

## 2014-10-17 MED ORDER — SPIRONOLACTONE 25 MG PO TABS: 12.5000 mg | ORAL_TABLET | Freq: Every day | ORAL | Status: DC

## 2014-10-17 NOTE — Progress Notes (Deleted)
Name: Hannah Cooper    DOB: 1955/05/06  Age: 59 y.o.  MR#: [id number]       PCP:  Maggie Font, MD      Insurance: Payor: MEDICAID [place] / Plan: MEDICAID [place] ACCESS / Product Type: *No Product type* /   CC:    Chief Complaint  Patient presents with  . Congestive Heart Failure  . Hypertension    VS Filed Vitals:   10/17/14 1304  BP: 148/88  Height: 5\' 8"  (1.727 m)  Weight: 218 lb (98.884 kg)    Weights Current Weight  10/17/14 218 lb (98.884 kg)  08/20/14 220 lb (99.791 kg)  08/14/14 200 lb (90.719 kg)    Blood Pressure  BP Readings from Last 3 Encounters:  10/17/14 148/88  08/20/14 162/94  08/14/14 158/69     Admit date:  (Not on file) Last encounter with RMR:  08/10/2014   Allergy Other  Current Outpatient Prescriptions  Medication Sig Dispense Refill  . albuterol (PROVENTIL HFA;VENTOLIN HFA) 108 (90 BASE) MCG/ACT inhaler Inhale 2 puffs into the lungs every 4 (four) hours as needed for wheezing or shortness of breath. 1 Inhaler 2  . albuterol (PROVENTIL) (2.5 MG/3ML) 0.083% nebulizer solution Take 2.5 mg by nebulization every 6 (six) hours as needed for wheezing or shortness of breath.    Jearl Klinefelter ELLIPTA 62.5-25 MCG/INH AEPB Inhale 1 puff into the lungs daily.  0  . atorvastatin (LIPITOR) 20 MG tablet Take 20 mg by mouth daily.     . cycloSPORINE (RESTASIS) 0.05 % ophthalmic emulsion Place 1 drop into both eyes 2 (two) times daily.     . famotidine (PEPCID) 40 MG tablet Take 40 mg by mouth 2 (two) times daily as needed (TAKE 15 MINUTES BEFORE TAKING IBUPROFEN.).   2  . fluorometholone (FML) 0.1 % ophthalmic suspension Place 1 drop into both eyes 2 (two) times daily. Use 15 minutes before using restasis.    . furosemide (LASIX) 40 MG tablet Take 1 tablet (40 mg total) by mouth daily. 90 tablet 3  . HYDROcodone-acetaminophen (NORCO/VICODIN) 5-325 MG per tablet Take 1 tablet by mouth every 4 (four) hours as needed. (Patient taking differently: Take 1 tablet by mouth  every 4 (four) hours as needed for moderate pain. ) 20 tablet 0  . ibuprofen (ADVIL,MOTRIN) 800 MG tablet Take 1 tablet by mouth 2 (two) times daily as needed (BACK PAIN).   2  . isosorbide mononitrate (IMDUR) 30 MG 24 hr tablet TAKE 1 TABLET (30 MG TOTAL) BY MOUTH DAILY. 90 tablet 1  . lisinopril (PRINIVIL,ZESTRIL) 20 MG tablet Take 1 tablet (20 mg total) by mouth daily. 90 tablet 3  . meloxicam (MOBIC) 7.5 MG tablet Take 7.5 mg by mouth daily.    . potassium chloride SA (KLOR-CON M20) 20 MEQ tablet Take 1 tablet (20 mEq total) by mouth daily. 90 tablet 3   No current facility-administered medications for this visit.    Discontinued Meds:    Medications Discontinued During This Encounter  Medication Reason  . predniSONE (DELTASONE) 10 MG tablet Error    Patient Active Problem List   Diagnosis Date Noted  . COPD (chronic obstructive pulmonary disease) 08/20/2014  . Dyspnea 12/19/2013  . Chronic diastolic CHF (congestive heart failure) 07/05/2013  . Lower extremity edema 07/05/2013  . Hyperlipidemia   . Chest pain 07/04/2013  . Hypokalemia 09/04/2012  . Precordial pain 09/04/2012  . Viral meningitis 01/22/2011  . Vomiting 01/22/2011  . PNEUMONIA, LEFT LOWER LOBE 10/10/2006  .  DISEASE, ACUTE BRONCHOSPASM 10/10/2006  . HYPOTHYROIDISM 05/23/2006  . Essential hypertension 05/23/2006  . OSTEOARTHRITIS 05/23/2006    LABS    Component Value Date/Time   NA 142 08/14/2014 1030   NA 142 06/05/2014 1250   NA 142 11/15/2013 2124   K 3.6 08/14/2014 1030   K 3.8 06/05/2014 1250   K 3.8 11/15/2013 2124   CL 108 08/14/2014 1030   CL 109 06/05/2014 1250   CL 102 11/15/2013 2124   CO2 27 08/14/2014 1030   CO2 26 06/05/2014 1250   CO2 30 11/15/2013 2124   GLUCOSE 88 08/14/2014 1030   GLUCOSE 88 06/05/2014 1250   GLUCOSE 114* 11/15/2013 2124   BUN 16 08/14/2014 1030   BUN 12 06/05/2014 1250   BUN 12 11/15/2013 2124   CREATININE 0.94 08/14/2014 1030   CREATININE 0.85 06/05/2014  1250   CREATININE 0.80 11/15/2013 2124   CREATININE 0.85 07/12/2013 1558   CREATININE 0.81 09/20/2012 1412   CALCIUM 9.4 08/14/2014 1030   CALCIUM 9.4 06/05/2014 1250   CALCIUM 9.6 11/15/2013 2124   GFRNONAA 65* 08/14/2014 1030   GFRNONAA 73* 06/05/2014 1250   GFRNONAA 79* 11/15/2013 2124   GFRAA 75* 08/14/2014 1030   GFRAA 85* 06/05/2014 1250   GFRAA >90 11/15/2013 2124   CMP     Component Value Date/Time   NA 142 08/14/2014 1030   K 3.6 08/14/2014 1030   CL 108 08/14/2014 1030   CO2 27 08/14/2014 1030   GLUCOSE 88 08/14/2014 1030   BUN 16 08/14/2014 1030   CREATININE 0.94 08/14/2014 1030   CREATININE 0.85 07/12/2013 1558   CALCIUM 9.4 08/14/2014 1030   PROT 7.9 11/15/2013 2124   ALBUMIN 4.0 11/15/2013 2124   AST 30 11/15/2013 2124   ALT 40* 11/15/2013 2124   ALKPHOS 80 11/15/2013 2124   BILITOT 0.2* 11/15/2013 2124   GFRNONAA 65* 08/14/2014 1030   GFRAA 75* 08/14/2014 1030       Component Value Date/Time   WBC 3.7* 08/14/2014 1030   WBC 4.2 06/05/2014 1250   WBC 3.6* 11/15/2013 2124   HGB 12.3 08/14/2014 1030   HGB 11.9* 06/05/2014 1250   HGB 11.8* 11/15/2013 2124   HCT 37.0 08/14/2014 1030   HCT 35.8* 06/05/2014 1250   HCT 34.7* 11/15/2013 2124   MCV 88.7 08/14/2014 1030   MCV 89.7 06/05/2014 1250   MCV 88.3 11/15/2013 2124    Lipid Panel     Component Value Date/Time   CHOL 102 07/05/2013 0537   TRIG 97 07/05/2013 0537   HDL 46 07/05/2013 0537   CHOLHDL 2.2 07/05/2013 0537   VLDL 19 07/05/2013 0537   LDLCALC 37 07/05/2013 0537    ABG No results found for: PHART, PCO2ART, PO2ART, HCO3, TCO2, ACIDBASEDEF, O2SAT   Lab Results  Component Value Date   TSH 4.995* 07/05/2013   BNP (last 3 results)  Recent Labs  08/14/14 1030  BNP 26.0    ProBNP (last 3 results) No results for input(s): PROBNP in the last 8760 hours.  Cardiac Panel (last 3 results) No results for input(s): CKTOTAL, CKMB, TROPONINI, RELINDX in the last 72 hours.   Iron/TIBC/Ferritin/ %Sat No results found for: IRON, TIBC, FERRITIN, IRONPCTSAT   EKG Orders placed or performed during the hospital encounter of 08/14/14  . ED EKG  . ED EKG  . EKG 12-Lead  . EKG 12-Lead  . EKG     Prior Assessment and Plan Problem List as of 10/17/2014  Cardiovascular and Mediastinum   Essential hypertension   Last Assessment & Plan 08/20/2014 Office Visit Written 08/20/2014 11:53 AM by Imogene Burn, PA-C    Uncontrolled. Increase lisinopril to 20 mg daily. 2 g sodium diet.      Chronic diastolic CHF (congestive heart failure)   Last Assessment & Plan 08/20/2014 Office Visit Written 08/20/2014 11:52 AM by Imogene Burn, PA-C    Patient has acute on chronic diastolic heart failure. Will increase Lasix to 40 mg once daily. Increase potassium to 20 mEq daily. Her blood pressure is also not controlled. Increase lisinopril to 20 mg daily. Check bmet in 2 weeks and see her back in 2 weeks.        Respiratory   PNEUMONIA, LEFT LOWER LOBE   DISEASE, ACUTE BRONCHOSPASM   COPD (chronic obstructive pulmonary disease)   Last Assessment & Plan 08/20/2014 Office Visit Written 08/20/2014 11:54 AM by Imogene Burn, PA-C    Managed by Dr. Luan Pulling. Currently on antibiotics and steroids.        Endocrine   HYPOTHYROIDISM   Last Assessment & Plan 09/20/2012 Office Visit Written 09/20/2012  2:15 PM by Lendon Colonel, NP    Recheck THS        Nervous and Auditory   Viral meningitis     Musculoskeletal and Integument   OSTEOARTHRITIS     Other   Vomiting   Hypokalemia   Last Assessment & Plan 11/12/2012 Office Visit Written 11/12/2012  4:46 PM by Lendon Colonel, NP    Recent labs demonstrate a potassium of 4.6.       Precordial pain   Last Assessment & Plan 09/20/2012 Office Visit Written 09/20/2012  2:16 PM by Lendon Colonel, NP    Resolved.      Chest pain   Last Assessment & Plan 08/20/2014 Office Visit Written 08/20/2014 11:53 AM by Imogene Burn, PA-C     Recent emergency room visit with chest pain in the setting of bronchitis and COPD exacerbation. Troponins EKGs negative CT negative for PE. No further workup.      Hyperlipidemia   Lower extremity edema   Dyspnea   Last Assessment & Plan 05/16/2014 Office Visit Written 05/16/2014  4:20 PM by Lendon Colonel, NP    PFT's abnormal. She is found to have evidence of COPD. Will refer to Dr. Luan Pulling for pulmonology consult. She will be started on Proventil inhaler prn. We will defer any further testing and treatment to Dr. Luan Pulling. She is willing to see him.           Imaging: No results found.

## 2014-10-17 NOTE — Patient Instructions (Signed)
Your physician recommends that you schedule a follow-up appointment in: 3 months with Arnold Long, NP  Your physician has recommended you make the following change in your medication:   START TAKING: Spironolactone 12.5 Daily   Your physician recommends that you return for lab work in:  Tue. (BMET)   Thank you for choosing [facility]!

## 2014-10-17 NOTE — Progress Notes (Signed)
Cardiology Office Note   Date:  10/17/2014   ID:  Hannah Cooper, DOB [birth year], MRN [id number]  PCP:  Maggie Font, MD  Cardiologist:  Cloria Spring, NP   Chief Complaint  Patient presents with  . Congestive Heart Failure  . Hypertension      History of Present Illness: Hannah Cooper is a 59 y.o. female who presents for ongoing assessment and management of diastolic CHF, hypertension, with hx of COPD. Shew as last seen int he office on 08/20/2014 with complaints of LEE. The patient echo reviewed, revealing normal LV systolic function with moderate LVH and diastolic dysfunction, but grade was indeterminate.  The patient states that she has not been adhering to a low sodium diet.  Blood pressure was elevated on the last office visit as well to 162/94.  Lisinopril was increased to 20 mg daily.  The patient's Lasix was increased to 40 mg daily.  Potassium was increased to 20 mEq daily.  The patient was to have followup labs in 2 weeks with a followup appointment to evaluate her status.  Unfortunately the patient did not followup and have labs completed.  She has since been seen by Dr. Luan Pulling who is treating her newly diagnosed COPD. Lasix was increased due to increased edema. She is also now on meloxicam for arthritis and is having some lower ext edema.    Past Medical History  Diagnosis Date  . Meningitis   . Hypertension   . Hyperlipidemia   . CHF (congestive heart failure)   . Arthritis   . COPD (chronic obstructive pulmonary disease)     Past Surgical History  Procedure Laterality Date  . Abdominal hysterectomy    . Thyroid surgery  2002  . Left heart catheterization with coronary angiogram N/A 09/05/2012    Procedure: LEFT HEART CATHETERIZATION WITH CORONARY ANGIOGRAM;  Surgeon: Wellington Hampshire, MD;  Location: [facility];  Service: Cardiovascular;  Laterality: N/A;     Current Outpatient Prescriptions  Medication Sig Dispense Refill  . albuterol  (PROVENTIL HFA;VENTOLIN HFA) 108 (90 BASE) MCG/ACT inhaler Inhale 2 puffs into the lungs every 4 (four) hours as needed for wheezing or shortness of breath. 1 Inhaler 2  . albuterol (PROVENTIL) (2.5 MG/3ML) 0.083% nebulizer solution Take 2.5 mg by nebulization every 6 (six) hours as needed for wheezing or shortness of breath.    Jearl Klinefelter ELLIPTA 62.5-25 MCG/INH AEPB Inhale 1 puff into the lungs daily.  0  . atorvastatin (LIPITOR) 20 MG tablet Take 20 mg by mouth daily.     . cycloSPORINE (RESTASIS) 0.05 % ophthalmic emulsion Place 1 drop into both eyes 2 (two) times daily.     . famotidine (PEPCID) 40 MG tablet Take 40 mg by mouth 2 (two) times daily as needed (TAKE 15 MINUTES BEFORE TAKING IBUPROFEN.).   2  . fluorometholone (FML) 0.1 % ophthalmic suspension Place 1 drop into both eyes 2 (two) times daily. Use 15 minutes before using restasis.    . furosemide (LASIX) 40 MG tablet Take 1 tablet (40 mg total) by mouth daily. 90 tablet 3  . HYDROcodone-acetaminophen (NORCO/VICODIN) 5-325 MG per tablet Take 1 tablet by mouth every 4 (four) hours as needed. (Patient taking differently: Take 1 tablet by mouth every 4 (four) hours as needed for moderate pain. ) 20 tablet 0  . ibuprofen (ADVIL,MOTRIN) 800 MG tablet Take 1 tablet by mouth 2 (two) times daily as needed (BACK PAIN).   2  . isosorbide mononitrate (IMDUR)  30 MG 24 hr tablet TAKE 1 TABLET (30 MG TOTAL) BY MOUTH DAILY. 90 tablet 1  . lisinopril (PRINIVIL,ZESTRIL) 20 MG tablet Take 1 tablet (20 mg total) by mouth daily. 90 tablet 3  . meloxicam (MOBIC) 7.5 MG tablet Take 7.5 mg by mouth daily.    . potassium chloride SA (KLOR-CON M20) 20 MEQ tablet Take 1 tablet (20 mEq total) by mouth daily. 90 tablet 3   No current facility-administered medications for this visit.    Allergies:   Other    Social History:  The patient  reports that she has never smoked. She has never used smokeless tobacco. She reports that she does not drink alcohol or use  illicit drugs.   Family History:  The patient's family history includes Heart attack (age of onset: 48) in her sister; Heart attack (age of onset: 58) in her mother.    ROS: .   All other systems are reviewed and negative.Unless otherwise mentioned in H&P above.   PHYSICAL EXAM: VS:  BP 148/88 mmHg  Pulse 80  Ht 5\' 8"  (1.727 m)  Wt 218 lb (98.884 kg)  BMI 33.15 kg/m2 , BMI Body mass index is 33.15 kg/(m^2). GEN: Well nourished, well developed, in no acute distress HEENT: normal Neck: no JVD, carotid bruits, or masses Cardiac: RRR; no murmurs, rubs, or gallops,no edema  Respiratory:  clear to auscultation bilaterally, normal work of breathing GI: soft, nontender, nondistended, + BS MS: no deformity or atrophy Skin: warm and dry, no rash Neuro:  Strength and sensation are intact Psych: euthymic mood, full affect   Recent Labs: 11/15/2013: ALT 40* 08/14/2014: B Natriuretic Peptide 26.0; BUN 16; Creatinine, Ser 0.94; Hemoglobin 12.3; Platelets 252; Potassium 3.6; Sodium 142    Lipid Panel    Component Value Date/Time   CHOL 102 07/05/2013 0537   TRIG 97 07/05/2013 0537   HDL 46 07/05/2013 0537   CHOLHDL 2.2 07/05/2013 0537   VLDL 19 07/05/2013 0537   LDLCALC 37 07/05/2013 0537      Wt Readings from Last 3 Encounters:  10/17/14 218 lb (98.884 kg)  08/20/14 220 lb (99.791 kg)  08/14/14 200 lb (90.719 kg)      Other studies Reviewed: Additional studies/ records that were reviewed today include: PFT. Review of the above records demonstrates: Patient with COPD. Copy of test results to the patient.   ASSESSMENT AND PLAN:  1. Hypertension:  Moderately controlled currently but is higher at home. She has been placed on meloxicam which may be contributing to BP status. I will had spironolactone 12.5 mg to her regimen. This will help with her edema and also BP. I will repeat BMET in 3 days. May need to go down on potassium dose.   2. COPD: No evidence of significant apnea  but continues difficulty breathing with associated fatigue. She is to follow up with Dr. Luan Pulling.    Current medicines are reviewed at length with the patient today.    Labs/ tests ordered today include: BMET No orders of the defined types were placed in this encounter.     Disposition:   FU with 3 months    Signed, [name], NP  10/17/2014 1:41 PM    [facility]. [address], [place], [place] [address] Phone: [phone]; Fax: [phone]

## 2014-12-04 ENCOUNTER — Other Ambulatory Visit: Payer: Self-pay | Admitting: Physician Assistant

## 2015-01-04 ENCOUNTER — Observation Stay (HOSPITAL_COMMUNITY)
Admission: EM | Admit: 2015-01-04 | Discharge: 2015-01-05 | Disposition: A | Discharge status: Home / Self Care | Payer: Medicaid Other | Attending: Family Medicine | Admitting: Family Medicine

## 2015-01-04 ENCOUNTER — Other Ambulatory Visit: Payer: Self-pay

## 2015-01-04 ENCOUNTER — Encounter (HOSPITAL_COMMUNITY): Payer: Self-pay | Admitting: Emergency Medicine

## 2015-01-04 ENCOUNTER — Emergency Department (HOSPITAL_COMMUNITY): Payer: Medicaid Other

## 2015-01-04 DIAGNOSIS — M1990 Unspecified osteoarthritis, unspecified site: Secondary | ICD-10-CM | POA: Diagnosis not present

## 2015-01-04 DIAGNOSIS — Z79899 Other long term (current) drug therapy: Secondary | ICD-10-CM | POA: Diagnosis not present

## 2015-01-04 DIAGNOSIS — J449 Chronic obstructive pulmonary disease, unspecified: Secondary | ICD-10-CM | POA: Diagnosis not present

## 2015-01-04 DIAGNOSIS — E785 Hyperlipidemia, unspecified: Secondary | ICD-10-CM | POA: Diagnosis not present

## 2015-01-04 DIAGNOSIS — R079 Chest pain, unspecified: Secondary | ICD-10-CM | POA: Diagnosis present

## 2015-01-04 DIAGNOSIS — G039 Meningitis, unspecified: Secondary | ICD-10-CM | POA: Diagnosis not present

## 2015-01-04 DIAGNOSIS — R0600 Dyspnea, unspecified: Secondary | ICD-10-CM | POA: Diagnosis present

## 2015-01-04 DIAGNOSIS — E039 Hypothyroidism, unspecified: Secondary | ICD-10-CM | POA: Diagnosis not present

## 2015-01-04 DIAGNOSIS — I509 Heart failure, unspecified: Secondary | ICD-10-CM | POA: Diagnosis not present

## 2015-01-04 DIAGNOSIS — J438 Other emphysema: Secondary | ICD-10-CM | POA: Diagnosis not present

## 2015-01-04 DIAGNOSIS — I10 Essential (primary) hypertension: Secondary | ICD-10-CM | POA: Diagnosis not present

## 2015-01-04 DIAGNOSIS — I5032 Chronic diastolic (congestive) heart failure: Secondary | ICD-10-CM | POA: Diagnosis not present

## 2015-01-04 LAB — CBC WITH DIFFERENTIAL/PLATELET
Basophils Absolute: 0 10*3/uL (ref 0.0–0.1)
Basophils Relative: 0 %
Eosinophils Absolute: 0.1 10*3/uL (ref 0.0–0.7)
Eosinophils Relative: 3 %
HCT: 39.8 % (ref 36.0–46.0)
Hemoglobin: 13.7 g/dL (ref 12.0–15.0)
Lymphocytes Relative: 38 %
Lymphs Abs: 1.4 10*3/uL (ref 0.7–4.0)
MCH: 30.6 pg (ref 26.0–34.0)
MCHC: 34.4 g/dL (ref 30.0–36.0)
MCV: 88.8 fL (ref 78.0–100.0)
Monocytes Absolute: 0.4 10*3/uL (ref 0.1–1.0)
Monocytes Relative: 10 %
Neutro Abs: 1.8 10*3/uL (ref 1.7–7.7)
Neutrophils Relative %: 49 %
Platelets: 250 10*3/uL (ref 150–400)
RBC: 4.48 MIL/uL (ref 3.87–5.11)
RDW: 13.5 % (ref 11.5–15.5)
WBC: 3.7 10*3/uL — ABNORMAL LOW (ref 4.0–10.5)

## 2015-01-04 LAB — COMPREHENSIVE METABOLIC PANEL
ALK PHOS: 82 U/L (ref 38–126)
ALT: 20 U/L (ref 14–54)
ANION GAP: 9 (ref 5–15)
AST: 19 U/L (ref 15–41)
Albumin: 4.3 g/dL (ref 3.5–5.0)
BUN: 11 mg/dL (ref 6–20)
CALCIUM: 9.3 mg/dL (ref 8.9–10.3)
CO2: 27 mmol/L (ref 22–32)
CREATININE: 0.72 mg/dL (ref 0.44–1.00)
Chloride: 106 mmol/L (ref 101–111)
Glucose, Bld: 90 mg/dL (ref 65–99)
Potassium: 3.2 mmol/L — ABNORMAL LOW (ref 3.5–5.1)
SODIUM: 142 mmol/L (ref 135–145)
TOTAL PROTEIN: 8 g/dL (ref 6.5–8.1)
Total Bilirubin: 0.4 mg/dL (ref 0.3–1.2)

## 2015-01-04 LAB — BRAIN NATRIURETIC PEPTIDE: B NATRIURETIC PEPTIDE 5: 19 pg/mL (ref 0.0–100.0)

## 2015-01-04 LAB — TROPONIN I

## 2015-01-04 MED ORDER — FLUOROMETHOLONE 0.1 % OP SUSP
1.0000 [drp] | Freq: Two times a day (BID) | OPHTHALMIC | Status: DC
  Filled 2015-01-04: qty 5

## 2015-01-04 MED ORDER — LORAZEPAM 1 MG PO TABS: 1.0000 mg | ORAL_TABLET | Freq: Three times a day (TID) | ORAL | Status: DC | PRN

## 2015-01-04 MED ORDER — DOCUSATE SODIUM 100 MG PO CAPS
100.0000 mg | ORAL_CAPSULE | Freq: Two times a day (BID) | ORAL | Status: DC
  Administered 2015-01-05 (×2): 100 mg via ORAL
  Filled 2015-01-04 (×2): qty 1

## 2015-01-04 MED ORDER — CYCLOSPORINE 0.05 % OP EMUL
1.0000 [drp] | Freq: Two times a day (BID) | OPHTHALMIC | Status: DC
  Administered 2015-01-05: 1 [drp] via OPHTHALMIC
  Filled 2015-01-04 (×6): qty 1

## 2015-01-04 MED ORDER — UMECLIDINIUM-VILANTEROL 62.5-25 MCG/INH IN AEPB: 1.0000 | INHALATION_SPRAY | Freq: Every day | RESPIRATORY_TRACT | Status: DC

## 2015-01-04 MED ORDER — ASPIRIN 81 MG PO CHEW
324.0000 mg | CHEWABLE_TABLET | Freq: Once | ORAL | Status: AC
  Administered 2015-01-04: 324 mg via ORAL
  Filled 2015-01-04: qty 4

## 2015-01-04 MED ORDER — FAMOTIDINE 20 MG PO TABS: 40.0000 mg | ORAL_TABLET | Freq: Two times a day (BID) | ORAL | Status: DC | PRN

## 2015-01-04 MED ORDER — MORPHINE SULFATE (PF) 2 MG/ML IV SOLN
2.0000 mg | INTRAVENOUS | Status: DC | PRN
Start: 2015-01-04 — End: 2015-01-05
  Administered 2015-01-05 (×2): 2 mg via INTRAVENOUS
  Filled 2015-01-04 (×2): qty 1

## 2015-01-04 MED ORDER — ONDANSETRON HCL 4 MG/2ML IJ SOLN
4.0000 mg | Freq: Once | INTRAMUSCULAR | Status: AC
  Administered 2015-01-04: 4 mg via INTRAVENOUS

## 2015-01-04 MED ORDER — MORPHINE SULFATE (PF) 4 MG/ML IV SOLN
4.0000 mg | Freq: Once | INTRAVENOUS | Status: AC
  Administered 2015-01-04: 4 mg via INTRAVENOUS
  Filled 2015-01-04: qty 1

## 2015-01-04 MED ORDER — ISOSORBIDE MONONITRATE ER 60 MG PO TB24
60.0000 mg | ORAL_TABLET | Freq: Every day | ORAL | Status: DC
  Administered 2015-01-05 (×2): 60 mg via ORAL
  Filled 2015-01-04 (×2): qty 1

## 2015-01-04 MED ORDER — ALBUTEROL SULFATE (2.5 MG/3ML) 0.083% IN NEBU: 2.5000 mg | INHALATION_SOLUTION | Freq: Four times a day (QID) | RESPIRATORY_TRACT | Status: DC | PRN

## 2015-01-04 MED ORDER — ALUM & MAG HYDROXIDE-SIMETH 200-200-20 MG/5ML PO SUSP: 30.0000 mL | Freq: Four times a day (QID) | ORAL | Status: DC | PRN

## 2015-01-04 MED ORDER — SPIRONOLACTONE 25 MG PO TABS
12.5000 mg | ORAL_TABLET | Freq: Every day | ORAL | Status: DC
  Administered 2015-01-05: 12.5 mg via ORAL
  Filled 2015-01-04 (×2): qty 1

## 2015-01-04 MED ORDER — ONDANSETRON HCL 4 MG/2ML IJ SOLN
4.0000 mg | Freq: Once | INTRAMUSCULAR | Status: DC
  Filled 2015-01-04: qty 2

## 2015-01-04 MED ORDER — ENOXAPARIN SODIUM 40 MG/0.4ML ~~LOC~~ SOLN
40.0000 mg | SUBCUTANEOUS | Status: DC
  Administered 2015-01-05: 40 mg via SUBCUTANEOUS
  Filled 2015-01-04: qty 0.4

## 2015-01-04 MED ORDER — ONDANSETRON HCL 4 MG PO TABS: 4.0000 mg | ORAL_TABLET | Freq: Four times a day (QID) | ORAL | Status: DC | PRN

## 2015-01-04 MED ORDER — ONDANSETRON HCL 4 MG/2ML IJ SOLN
4.0000 mg | Freq: Four times a day (QID) | INTRAMUSCULAR | Status: DC | PRN
  Administered 2015-01-05 (×2): 4 mg via INTRAVENOUS
  Filled 2015-01-04 (×2): qty 2

## 2015-01-04 MED ORDER — HYDRALAZINE HCL 20 MG/ML IJ SOLN: 10.0000 mg | INTRAMUSCULAR | Status: DC | PRN

## 2015-01-04 MED ORDER — VALACYCLOVIR HCL 500 MG PO TABS
1000.0000 mg | ORAL_TABLET | Freq: Three times a day (TID) | ORAL | Status: DC
  Administered 2015-01-04 – 2015-01-05 (×2): 1000 mg via ORAL
  Filled 2015-01-04 (×8): qty 2

## 2015-01-04 MED ORDER — SODIUM CHLORIDE 0.9 % IJ SOLN
3.0000 mL | Freq: Two times a day (BID) | INTRAMUSCULAR | Status: DC
Start: 2015-01-04 — End: 2015-01-05
  Administered 2015-01-05 (×2): 3 mL via INTRAVENOUS

## 2015-01-04 MED ORDER — LISINOPRIL 10 MG PO TABS
20.0000 mg | ORAL_TABLET | Freq: Every day | ORAL | Status: DC
  Administered 2015-01-05: 20 mg via ORAL
  Filled 2015-01-04: qty 2

## 2015-01-04 MED ORDER — ATORVASTATIN CALCIUM 40 MG PO TABS
80.0000 mg | ORAL_TABLET | Freq: Every day | ORAL | Status: DC
  Filled 2015-01-04 (×2): qty 1

## 2015-01-04 NOTE — H&P (Signed)
[facility] History and Physical  Hannah Cooper DXI:[id number] DOB: June 30, 1955    PCP:   Maggie Font, MD   Chief Complaint: chest heaviness.   HPI: Hannah Cooper is an 59 y.o. female  With hx of HTN, HLD, COPD, diastolic CHF, never a smoker, presented to the ER feeling chest heaviness as if someone was sitting on her chest.  She also had some nausea, and diaphoresis.  She denied black or bloody stool, abdominal pain, fever, chills, or coughs.  She also had some burning pain and a few blister on her left side, similar to her prior shingle outbreak.  Evalaution in the ER with EKG showing NSR, no acute ST T changes.   Her troponin was negative and her CXR was clear.  She has normal Hb, LFTs, and renal Fx tests.  She is pain free in the ER. Hospitalist was asked to admit her for cardiac r/out.  She was given Valtrex for her H. Zoster.  Review of her record showed that she had a cardiopulmonary stress test last year, but it was subopitmal.    Rewiew of Systems:  Constitutional: Negative for malaise, fever and chills. No significant weight loss or weight gain Eyes: Negative for eye pain, redness and discharge, diplopia, visual changes, or flashes of light. ENMT: Negative for ear pain, hoarseness, nasal congestion, sinus pressure and sore throat. No headaches; tinnitus, drooling, or problem swallowing. Cardiovascular: Negative for palpitations,dyspnea and peripheral edema. ; No orthopnea, PND Respiratory: Negative for cough, hemoptysis, wheezing and stridor. No pleuritic chestpain. Gastrointestinal: Negative for nausea, vomiting, diarrhea, constipation, abdominal pain, melena, blood in stool, hematemesis, jaundice and rectal bleeding.    Genitourinary: Negative for frequency, dysuria, incontinence,flank pain and hematuria; Musculoskeletal: Negative for back pain and neck pain. Negative for swelling and trauma.;  Skin: . Negative for pruritus, rash, abrasions, bruising and skin  lesion.; ulcerations Neuro: Negative for headache, lightheadedness and neck stiffness. Negative for weakness, altered level of consciousness , altered mental status, extremity weakness, burning feet, involuntary movement, seizure and syncope.  Psych: negative for anxiety, depression, insomnia, tearfulness, panic attacks, hallucinations, paranoia, suicidal or homicidal ideation    Past Medical History  Diagnosis Date  . Meningitis   . Hypertension   . Hyperlipidemia   . CHF (congestive heart failure)   . Arthritis   . COPD (chronic obstructive pulmonary disease)     Past Surgical History  Procedure Laterality Date  . Abdominal hysterectomy    . Thyroid surgery  2002  . Left heart catheterization with coronary angiogram N/A 09/05/2012    Procedure: LEFT HEART CATHETERIZATION WITH CORONARY ANGIOGRAM;  Surgeon: Wellington Hampshire, MD;  Location: [facility];  Service: Cardiovascular;  Laterality: N/A;    Medications:  HOME MEDS: Prior to Admission medications   Medication Sig Start Date End Date Taking? Authorizing Provider  albuterol (PROVENTIL HFA;VENTOLIN HFA) 108 (90 BASE) MCG/ACT inhaler Inhale 2 puffs into the lungs every 4 (four) hours as needed for wheezing or shortness of breath. 05/16/14   Lendon Colonel, NP  albuterol (PROVENTIL) (2.5 MG/3ML) 0.083% nebulizer solution Take 2.5 mg by nebulization every 6 (six) hours as needed for wheezing or shortness of breath.    Historical Provider, MD  ANORO ELLIPTA 62.5-25 MCG/INH AEPB Inhale 1 puff into the lungs daily. 05/27/14   Historical Provider, MD  atorvastatin (LIPITOR) 20 MG tablet Take 20 mg by mouth daily.     Historical Provider, MD  cycloSPORINE (RESTASIS) 0.05 % ophthalmic emulsion Place 1  drop into both eyes 2 (two) times daily.     Historical Provider, MD  famotidine (PEPCID) 40 MG tablet Take 40 mg by mouth 2 (two) times daily as needed (TAKE 15 MINUTES BEFORE TAKING IBUPROFEN.).  08/01/14   Historical Provider, MD   fluorometholone (FML) 0.1 % ophthalmic suspension Place 1 drop into both eyes 2 (two) times daily. Use 15 minutes before using restasis.    Historical Provider, MD  furosemide (LASIX) 40 MG tablet Take 1 tablet (40 mg total) by mouth daily. 08/20/14   Imogene Burn, PA-C  HYDROcodone-acetaminophen (NORCO/VICODIN) 5-325 MG per tablet Take 1 tablet by mouth every 4 (four) hours as needed. Patient taking differently: Take 1 tablet by mouth every 4 (four) hours as needed for moderate pain.  06/05/14   Evalee Jefferson, PA-C  ibuprofen (ADVIL,MOTRIN) 800 MG tablet Take 1 tablet by mouth 2 (two) times daily as needed (BACK PAIN).  08/01/14   Historical Provider, MD  isosorbide mononitrate (IMDUR) 30 MG 24 hr tablet TAKE 1 TABLET (30 MG TOTAL) BY MOUTH DAILY. 08/11/14   Lendon Colonel, NP  lisinopril (PRINIVIL,ZESTRIL) 20 MG tablet TAKE 1 TABLET BY MOUTH EVERY DAY 12/04/14   Lendon Colonel, NP  meloxicam (MOBIC) 7.5 MG tablet Take 7.5 mg by mouth daily.    Historical Provider, MD  potassium chloride SA (KLOR-CON M20) 20 MEQ tablet Take 1 tablet (20 mEq total) by mouth daily. 08/20/14   Imogene Burn, PA-C  spironolactone (ALDACTONE) 25 MG tablet Take 0.5 tablets (12.5 mg total) by mouth daily. 10/17/14   Lendon Colonel, NP     Allergies:  Allergies  Allergen Reactions  . Other Swelling    Avon lipstick    Social History:   reports that she has never smoked. She has never used smokeless tobacco. She reports that she does not drink alcohol or use illicit drugs.  Family History: Family History  Problem Relation Age of Onset  . Heart attack Mother 80  . Heart attack Sister 66     Physical Exam: Filed Vitals:   01/04/15 1945 01/04/15 2000 01/04/15 2030 01/04/15 2100  BP:  172/83 180/117 173/102  Pulse: 56 57 62 61  Temp:      TempSrc:      Resp: 18 16 17 18   Height:      SpO2: 100% 99% 100% 100%   Blood pressure 173/102, pulse 61, temperature 98.4 F (36.9 C), temperature source  Oral, resp. rate 18, height 5\' 9"  (1.753 m), SpO2 100 %.  GEN:  Pleasant  patient lying in the stretcher in no acute distress; cooperative with exam. PSYCH:  alert and oriented x4; does not appear anxious or depressed; affect is appropriate. HEENT: Mucous membranes pink and anicteric; PERRLA; EOM intact; no cervical lymphadenopathy nor thyromegaly or carotid bruit; no JVD; There were no stridor. Neck is very supple. Breasts:: Not examined CHEST WALL: no tenderness.  CHEST: Normal respiration, clear to auscultation bilaterally.  HEART: Regular rate and rhythm.  There are no murmur, rub, or gallops.   BACK: No kyphosis or scoliosis; no CVA tenderness ABDOMEN: soft and non-tender; no masses, no organomegaly, normal abdominal bowel sounds; no pannus; no intertriginous candida. There is no rebound and no distention. Rectal Exam: Not done EXTREMITIES: No bone or joint deformity; age-appropriate arthropathy of the hands and knees; no edema; no ulcerations.  There is no calf tenderness. Genitalia: not examined PULSES: 2+ and symmetric SKIN: Normal hydration no rash or ulceration.  She  has several blister c/w Herpes Zoster.  CNS: Cranial nerves 2-12 grossly intact no focal lateralizing neurologic deficit.  Speech is fluent; uvula elevated with phonation, facial symmetry and tongue midline. DTR are normal bilaterally, cerebella exam is intact, barbinski is negative and strengths are equaled bilaterally.  No sensory loss.   Labs on Admission:  Basic Metabolic Panel:  Recent Labs Lab 01/04/15 1738  NA 142  K 3.2*  CL 106  CO2 27  GLUCOSE 90  BUN 11  CREATININE 0.72  CALCIUM 9.3   Liver Function Tests:  Recent Labs Lab 01/04/15 1738  AST 19  ALT 20  ALKPHOS 82  BILITOT 0.4  PROT 8.0  ALBUMIN 4.3   No results for input(s): LIPASE, AMYLASE in the last 168 hours. No results for input(s): AMMONIA in the last 168 hours. CBC:  Recent Labs Lab 01/04/15 1738  WBC 3.7*  NEUTROABS 1.8   HGB 13.7  HCT 39.8  MCV 88.8  PLT 250   Cardiac Enzymes:  Recent Labs Lab 01/04/15 1738  TROPONINI <0.03    CBG: No results for input(s): GLUCAP in the last 168 hours.   Radiological Exams on Admission: Dg Chest 2 View  01/04/2015   CLINICAL DATA:  CHEST PAIN, Patient c/o mid-sternal chest pain that started today while sitting in church. Patient states that she has had heaviness feeling in chest for "past couple of days" but today is when pain started. Shortness of breath, nausea back pain. Some cough.  EXAM: CHEST  2 VIEW  COMPARISON:  08/14/2014  FINDINGS: Cardiac silhouette is top-normal in size. No mediastinal or hilar masses or evidence of adenopathy.  Clear lungs.  No pleural effusion or pneumothorax.  Bony thorax is demineralized but intact.  IMPRESSION: No active cardiopulmonary disease.   Electronically Signed   By: Lajean Manes M.D.   On: 01/04/2015 18:32    EKG: Independently reviewed.    Assessment/Plan Present on Admission:  . Chest pain . Chronic diastolic CHF (congestive heart failure) . COPD (chronic obstructive pulmonary disease) . Dyspnea . Essential hypertension . Hyperlipidemia . Hypothyroidism=  PLAN:  She will be admitted for chest pain r/out, given her multiple cardiac risk factors.  I can't r/out GI, or even Zoster infection causing her CP, but she did have other associated symptoms of nausea, vomiting and diaphoresis.  Will likely needs a nuclear stress test or even angiogram, so will consult cardiology for further recommendation.  In the interim, I will increase her Imdur, continue her ASA, and increase her Lipitor to 80mg  per day.  Will cycle her troponins.  For her HTN, will continue her meds, and use IV Hydralazine PRN.  For her hypothyroidism, will continue with supplement, and check TSH.  For her COPD, will continue her meds.  I will give her some Ativan as well given her anxiety level. She is stable, full code, and will be admitted to [facility]  service.  Thank you and Good day.   Other plans as per orders.  Code Status: FULL Haskel Khan, MD. [facility] Pager [phone] 7pm to 7am.  01/04/2015, 9:29 PM

## 2015-01-04 NOTE — ED Notes (Signed)
Patient c/o mid-sternal chest pain that started today while sitting in church. Patient states that she has had heaviness feeling in chest for "past couple of days" but today is when pain started. Per patient shortness of breath, nausea, and back pain. Patient states she has had a cough with small amount of sputum. Patient also states that she feels like she is starting to have a shingles outbreak on upper abd.

## 2015-01-04 NOTE — ED Provider Notes (Signed)
CSN: [id number]     Arrival date & time 01/04/15  1715 History   First MD Initiated Contact with Patient 01/04/15 1938     Chief Complaint  Patient presents with  . Chest Pain     (Consider location/radiation/quality/duration/timing/severity/associated sxs/prior Treatment) HPI Comments: 59-year-old female with past medical history including CHF, hypertension, hyperlipidemia, COPD who presents with chest pain and shortness of breath. Patient states that she has had a mild central chest heaviness for the past couple of days. This evening she was in church and she had a gradual onset of sharp pains in her central chest which were nonradiating. She began having nausea and diaphoresis as well as shortness of breath. She denies any sudden ripping or tearing pain or radiation to her back. She was coughing this evening but denies any significant cough recently. No fevers or vomiting. She has been taking all her medications as prescribed. She does have some problems with lower extremity edema. Patient also notes that she thinks she is having a shingles outbreak on her left upper abdomen just below her breast. She is having burning pain and itching as well as skin changes that are consistent with a previous episode of shingles. No new soaps or detergents and no new clothes.  No personal or family history of blood clots. No recent travel. No history of cancer. Family history notable for mother and sisters with MI in their 42s-50 and grandparents on both sides with CAD.  Patient is a 59 y.o. female presenting with chest pain. The history is provided by the patient.  Chest Pain   Past Medical History  Diagnosis Date  . Meningitis   . Hypertension   . Hyperlipidemia   . CHF (congestive heart failure)   . Arthritis   . COPD (chronic obstructive pulmonary disease)    Past Surgical History  Procedure Laterality Date  . Abdominal hysterectomy    . Thyroid surgery  2002  . Left heart catheterization with  coronary angiogram N/A 09/05/2012    Procedure: LEFT HEART CATHETERIZATION WITH CORONARY ANGIOGRAM;  Surgeon: Wellington Hampshire, MD;  Location: [facility]ATH [facility];  Service: Cardiovascular;  Laterality: N/A;   Family History  Problem Relation Age of Onset  . Heart attack Mother 77  . Heart attack Sister 43   Social History  Substance Use Topics  . Smoking status: Never Smoker   . Smokeless tobacco: Never Used  . Alcohol Use: No   OB History    Gravida Para Term Preterm AB TAB SAB Ectopic Multiple Living            0     Review of Systems  Cardiovascular: Positive for chest pain.   10 Systems reviewed and are negative for acute change except as noted in the HPI.    Allergies  Other  Home Medications   Prior to Admission medications   Medication Sig Start Date End Date Taking? Authorizing Provider  albuterol (PROVENTIL HFA;VENTOLIN HFA) 108 (90 BASE) MCG/ACT inhaler Inhale 2 puffs into the lungs every 4 (four) hours as needed for wheezing or shortness of breath. 05/16/14   Lendon Colonel, NP  albuterol (PROVENTIL) (2.5 MG/3ML) 0.083% nebulizer solution Take 2.5 mg by nebulization every 6 (six) hours as needed for wheezing or shortness of breath.    Historical Provider, MD  ANORO ELLIPTA 62.5-25 MCG/INH AEPB Inhale 1 puff into the lungs daily. 05/27/14   Historical Provider, MD  atorvastatin (LIPITOR) 20 MG tablet Take 20 mg by mouth daily.  Historical Provider, MD  cycloSPORINE (RESTASIS) 0.05 % ophthalmic emulsion Place 1 drop into both eyes 2 (two) times daily.     Historical Provider, MD  famotidine (PEPCID) 40 MG tablet Take 40 mg by mouth 2 (two) times daily as needed (TAKE 15 MINUTES BEFORE TAKING IBUPROFEN.).  08/01/14   Historical Provider, MD  fluorometholone (FML) 0.1 % ophthalmic suspension Place 1 drop into both eyes 2 (two) times daily. Use 15 minutes before using restasis.    Historical Provider, MD  furosemide (LASIX) 40 MG tablet Take 1 tablet (40 mg total) by mouth  daily. 08/20/14   Imogene Burn, PA-C  HYDROcodone-acetaminophen (NORCO/VICODIN) 5-325 MG per tablet Take 1 tablet by mouth every 4 (four) hours as needed. Patient taking differently: Take 1 tablet by mouth every 4 (four) hours as needed for moderate pain.  06/05/14   Evalee Jefferson, PA-C  ibuprofen (ADVIL,MOTRIN) 800 MG tablet Take 1 tablet by mouth 2 (two) times daily as needed (BACK PAIN).  08/01/14   Historical Provider, MD  isosorbide mononitrate (IMDUR) 30 MG 24 hr tablet TAKE 1 TABLET (30 MG TOTAL) BY MOUTH DAILY. 08/11/14   Lendon Colonel, NP  lisinopril (PRINIVIL,ZESTRIL) 20 MG tablet TAKE 1 TABLET BY MOUTH EVERY DAY 12/04/14   Lendon Colonel, NP  meloxicam (MOBIC) 7.5 MG tablet Take 7.5 mg by mouth daily.    Historical Provider, MD  potassium chloride SA (KLOR-CON M20) 20 MEQ tablet Take 1 tablet (20 mEq total) by mouth daily. 08/20/14   Imogene Burn, PA-C  spironolactone (ALDACTONE) 25 MG tablet Take 0.5 tablets (12.5 mg total) by mouth daily. 10/17/14   Lendon Colonel, NP   BP 172/83 mmHg  Pulse 57  Temp(Src) 98.4 F (36.9 C) (Oral)  Resp 16  Ht 5\' 9"  (7.829 m)  SpO2 99% Physical Exam  Constitutional: She is oriented to person, place, and time. She appears well-developed and well-nourished. No distress.  HENT:  Head: Normocephalic and atraumatic.  Moist mucous membranes  Eyes: Conjunctivae are normal. Pupils are equal, round, and reactive to light.  Neck: Neck supple.  Cardiovascular: Normal rate, regular rhythm and normal heart sounds.   No murmur heard. Pulmonary/Chest: Effort normal. No respiratory distress. She has no wheezes.  Mildly diminished breath sounds bilaterally  Abdominal: Soft. Bowel sounds are normal. She exhibits no distension. There is no tenderness.  Musculoskeletal:  Trace edema bilateral ankles  Neurological: She is alert and oriented to person, place, and time.  Fluent speech  Skin:  A few clustered vesicles just below left breast on upper  abdomen, tender to palpation  Psychiatric: She has a normal mood and affect. Judgment normal.  Nursing note and vitals reviewed.   ED Course  Procedures (including critical care time) Labs Review Labs Reviewed  CBC WITH DIFFERENTIAL/PLATELET - Abnormal; Notable for the following:    WBC 3.7 (*)    All other components within normal limits  COMPREHENSIVE METABOLIC PANEL - Abnormal; Notable for the following:    Potassium 3.2 (*)    All other components within normal limits  TROPONIN I  BRAIN NATRIURETIC PEPTIDE    Imaging Review Dg Chest 2 View  01/04/2015   CLINICAL DATA:  CHEST PAIN, Patient c/o mid-sternal chest pain that started today while sitting in church. Patient states that she has had heaviness feeling in chest for "past couple of days" but today is when pain started. Shortness of breath, nausea back pain. Some cough.  EXAM: CHEST  2 VIEW  COMPARISON:  08/14/2014  FINDINGS: Cardiac silhouette is top-normal in size. No mediastinal or hilar masses or evidence of adenopathy.  Clear lungs.  No pleural effusion or pneumothorax.  Bony thorax is demineralized but intact.  IMPRESSION: No active cardiopulmonary disease.   Electronically Signed   By: Lajean Manes M.D.   On: 01/04/2015 18:32   I have personally reviewed and evaluated these lab results as part of my medical decision-making.   EKG Interpretation None       Sinus rhythm Rate 73 No ST segment or T-wave changes to suggest acute ischemia  Medications  aspirin chewable tablet 324 mg (not administered)  valACYclovir (VALTREX) tablet 1,000 mg (not administered)    MDM   Final diagnoses:  None   chest pain SOB Herpes zoster History of CHF History of hypertension    59 year old female with CHF, COPD, and hypertension who presents with an episode of chest pain associated with nausea, diaphoresis, and shortness of breath that occurred today during church. At presentation, the patient was awake, alert, and in no  acute distress. Vital signs notable for hypertension at 425 systolic. No respiratory distress or wheezing noted on exam. EKG on arrival shows sinus rhythm with no obvious ischemic changes. Obtained above lab work including troponin and also obtained chest x-ray. Gave the patient aspirin.  Labwork shows negative initial troponin and otherwise unremarkable basic labs. BNP pending. Chest x-ray unremarkable and shows no widened mediastinum. Given no ripping or tearing chest pain, I feel that aortic dissection is unlikely. Patient has no risk factors for PE. Regarding her skin changes in pain, her exam is consistent with herpes zoster and I have given her a dose of Valtrex. Regarding her chest pain, I am concerned about her significant family history of CAD as well as her concerning symptoms. BNP is pending. Her HEART score is at least 5 despite initial troponin negative. Patient will be admitted to general medicine for chest pain rule out given her multiple risk factors and significant family history.  Sharlett Iles, MD 01/04/15 [phone]

## 2015-01-05 DIAGNOSIS — R0600 Dyspnea, unspecified: Secondary | ICD-10-CM

## 2015-01-05 DIAGNOSIS — R0789 Other chest pain: Secondary | ICD-10-CM | POA: Diagnosis not present

## 2015-01-05 DIAGNOSIS — J438 Other emphysema: Secondary | ICD-10-CM

## 2015-01-05 DIAGNOSIS — R079 Chest pain, unspecified: Secondary | ICD-10-CM | POA: Diagnosis not present

## 2015-01-05 DIAGNOSIS — I10 Essential (primary) hypertension: Secondary | ICD-10-CM | POA: Diagnosis not present

## 2015-01-05 DIAGNOSIS — I5032 Chronic diastolic (congestive) heart failure: Secondary | ICD-10-CM

## 2015-01-05 DIAGNOSIS — E039 Hypothyroidism, unspecified: Secondary | ICD-10-CM

## 2015-01-05 DIAGNOSIS — E785 Hyperlipidemia, unspecified: Secondary | ICD-10-CM

## 2015-01-05 DIAGNOSIS — B029 Zoster without complications: Secondary | ICD-10-CM | POA: Diagnosis not present

## 2015-01-05 LAB — CBC
HEMATOCRIT: 35.7 % — AB (ref 36.0–46.0)
HEMOGLOBIN: 11.8 g/dL — AB (ref 12.0–15.0)
MCH: 29.5 pg (ref 26.0–34.0)
MCHC: 33.1 g/dL (ref 30.0–36.0)
MCV: 89.3 fL (ref 78.0–100.0)
Platelets: 236 10*3/uL (ref 150–400)
RBC: 4 MIL/uL (ref 3.87–5.11)
RDW: 13.5 % (ref 11.5–15.5)
WBC: 3.9 10*3/uL — ABNORMAL LOW (ref 4.0–10.5)

## 2015-01-05 LAB — BASIC METABOLIC PANEL
ANION GAP: 3 — AB (ref 5–15)
BUN: 11 mg/dL (ref 6–20)
CHLORIDE: 106 mmol/L (ref 101–111)
CO2: 31 mmol/L (ref 22–32)
Calcium: 8.7 mg/dL — ABNORMAL LOW (ref 8.9–10.3)
Creatinine, Ser: 0.73 mg/dL (ref 0.44–1.00)
GFR calc non Af Amer: >60 mL/min (ref >60)
GLUCOSE: 109 mg/dL — AB (ref 65–99)
POTASSIUM: 4.1 mmol/L (ref 3.5–5.1)
Sodium: 140 mmol/L (ref 135–145)

## 2015-01-05 LAB — TSH: TSH: 2.764 u[IU]/mL (ref 0.350–4.500)

## 2015-01-05 LAB — TROPONIN I
Troponin I: <0.03 ng/mL (ref <0.031)
Troponin I: <0.03 ng/mL (ref <0.031)

## 2015-01-05 MED ORDER — ONDANSETRON HCL 4 MG PO TABS
4.0000 mg | ORAL_TABLET | ORAL | Status: DC | PRN
  Filled 2015-01-05: qty 1

## 2015-01-05 MED ORDER — ONDANSETRON HCL 4 MG/2ML IJ SOLN
4.0000 mg | Freq: Four times a day (QID) | INTRAMUSCULAR | Status: DC | PRN
  Administered 2015-01-05: 4 mg via INTRAVENOUS
  Filled 2015-01-05: qty 2

## 2015-01-05 MED ORDER — ACETAMINOPHEN 325 MG PO TABS: 650.0000 mg | ORAL_TABLET | ORAL | Status: DC | PRN

## 2015-01-05 MED ORDER — HYDROCODONE-ACETAMINOPHEN 5-325 MG PO TABS
1.0000 | ORAL_TABLET | ORAL | Status: DC | PRN
Start: 2015-01-05 — End: 2015-01-05

## 2015-01-05 MED ORDER — PNEUMOCOCCAL VAC POLYVALENT 25 MCG/0.5ML IJ INJ
0.5000 mL | INJECTION | INTRAMUSCULAR | Status: DC
  Filled 2015-01-05: qty 0.5

## 2015-01-05 MED ORDER — PNEUMOCOCCAL VAC POLYVALENT 25 MCG/0.5ML IJ INJ
0.5000 mL | INJECTION | Freq: Once | INTRAMUSCULAR | Status: AC
  Administered 2015-01-05: 0.5 mL via INTRAMUSCULAR
  Filled 2015-01-05: qty 0.5

## 2015-01-05 MED ORDER — VALACYCLOVIR HCL 1 G PO TABS: 1000.0000 mg | ORAL_TABLET | Freq: Three times a day (TID) | ORAL | Status: DC

## 2015-01-05 MED ORDER — HYDROCODONE-ACETAMINOPHEN 5-325 MG PO TABS: 1.0000 | ORAL_TABLET | ORAL | Status: DC | PRN

## 2015-01-05 MED ORDER — INFLUENZA VAC SPLIT QUAD 0.5 ML IM SUSY
0.5000 mL | PREFILLED_SYRINGE | INTRAMUSCULAR | Status: AC
  Administered 2015-01-05: 0.5 mL via INTRAMUSCULAR

## 2015-01-05 NOTE — Care Management Note (Signed)
Case Management Note  Patient Details  Name: EDRA RICCARDI MRN: [id number] Date of Birth: [birth year]  Subjective/Objective:                  Pt admitted from home with CP. Pt lives with her daughter and will return home at discharge. Pt is independent with ADl's.  Action/Plan: No Cm needs noted. Pt for discharge home today.  Expected Discharge Date:  01/08/15               Expected Discharge Plan:  Home/Self Care  In-House Referral:  NA  Discharge planning Services  CM Consult  Post Acute Care Choice:  NA Choice offered to:  NA  DME Arranged:    DME Agency:     HH Arranged:    HH Agency:     Status of Service:  Completed, signed off  Medicare Important Message Given:    Date Medicare IM Given:    Medicare IM give by:    Date Additional Medicare IM Given:    Additional Medicare Important Message give by:     If discussed at [place] of Stay Meetings, dates discussed:    Additional Comments:  Joylene Draft, RN 01/05/2015, 3:01 PM

## 2015-01-05 NOTE — Progress Notes (Signed)
PROGRESS NOTE  Hannah Cooper WER:[id number] DOB: 01-31-1956 DOA: 01/04/2015 PCP: Maggie Font, MD  Cardiologist- Carlyle Dolly MD  Summary: 59 y.o. female with a hx of HTN, HLD, COPD, and diastolic CHF presented with chest heaviness, nausea, and diarrhea. While in the ED, EKG was unremarkable, CXR was clear, and troponin was negative. Also noted burning and blisters to her left side, similar to prior shingles outbreak. Admitted for further management. Records reveal she had a cardiopulmonary stress test last year, but it was subopitmal.   Assessment/Plan: 1. Atypical chest pain. CXR clear, EKG unremarkable, serial troponins negative thus far. ACS ruled out. Cardiology consulted and recommends a low salt diet and possible cardiopulmonary rehab. Suspect related to shingles. 2. Herpes Zoster below left breast. Continue Valtrex. 3. Hypokalemia, resolved. 4. Chronic diastolic CHF, stable. CXR unremarkable. 5. COPD, stable.  6. Essential hypertension, continue home meds 7. Hyperlipidemia, continue statin 8. Hypothyroidism, continue synthroid   Continue oral Valtrex as outpatient.  Discharge home today.   Code Status: FULL DVT prophylaxis: Lovenox Family Communication: Husband and son at the bedside. Discussed with patient who understands and has no concerns at this time. Disposition Plan: Anticipate discharge today.   Murray Hodgkins, MD  [facility]  Pager [phone] If 7PM-7AM, please contact night-coverage at [email or web address], password [facility] 01/05/2015, 6:33 AM    Consultants:  Cardiology  Procedures:    Antibiotics:    HPI/Subjective: Feels okay. Has pain under her left breast, a HA, and mild chest pain. Chronic back pain secondary to arthritis. Denies abdominal pain.    Objective: Filed Vitals:   01/04/15 2300 01/05/15 0007 01/05/15 0017 01/05/15 0430  BP: 167/74 157/73  120/69  Pulse: 68 55  59  Temp:  98.2 F (36.8 C)  98.2 F (36.8 C)  TempSrc:   Oral  Oral  Resp: 15 14  16   Height:   5\' 9"  (1.753 m)   Weight:   99.156 kg (218 lb 9.6 oz)   SpO2: 96% 100%  100%   No intake or output data in the 24 hours ending 01/05/15 0633   Filed Weights   01/05/15 0017  Weight: 99.156 kg (218 lb 9.6 oz)    Exam:    VSS, not hypoxic, afebrile General:  Appears calm and comfortable Eyes: PERRL, normal lids, irises & conjunctiva Cardiovascular: RRR, no m/r/g. No LE edema. Telemetry: SR, no arrhythmias  Respiratory: CTA bilaterally, no w/r/r. Normal respiratory effort. Abdomen: soft, ntnd Skin: few vesicles under the left breast, isolated dermatone Psychiatric: grossly normal mood and affect, speech fluent and appropriate Neurologic: grossly non-focal.  New data reviewed:  Troponins negative   TSH WNL  WBC 3.9, Hbg 11.8  BMP unremarkable  Pertinent data since admission:   EKG normal  CXR IMPRESSION: No active cardiopulmonary disease--I concur with radiologist's interpretation, independently reviewed.  EKG SR, no acute changes  Pending data:    Scheduled Meds: . atorvastatin  80 mg Oral q1800  . cycloSPORINE  1 drop Both Eyes BID  . docusate sodium  100 mg Oral BID  . enoxaparin (LOVENOX) injection  40 mg Subcutaneous Q24H  . fluorometholone  1 drop Both Eyes BID  . [START ON 01/06/2015] Influenza vac split quadrivalent PF  0.5 mL Intramuscular Tomorrow-1000  . isosorbide mononitrate  60 mg Oral Daily  . lisinopril  20 mg Oral Daily  . [START ON 01/06/2015] pneumococcal 23 valent vaccine  0.5 mL Intramuscular Tomorrow-1000  . sodium chloride  3 mL Intravenous Q12H  . spironolactone  12.5 mg Oral Daily  . Umeclidinium-Vilanterol  1 puff Inhalation Daily  . valACYclovir  1,000 mg Oral TID   Continuous Infusions:   Principal Problem:   Chest pain Active Problems:   Hypothyroidism   Essential hypertension   Hyperlipidemia   Chronic diastolic CHF (congestive heart failure)   Dyspnea   COPD (chronic  obstructive pulmonary disease)     By signing my name below, I, Rosalie Doctor attest that this documentation has been prepared under the direction and in the presence of Murray Hodgkins, MD Electronically signed: Rosalie Doctor, Scribe.  01/05/2015  I personally performed the services described in this documentation. All medical record entries made by the scribe were at my direction. I have reviewed the chart and agree that the record reflects my personal performance and is accurate and complete. Murray Hodgkins, MD

## 2015-01-05 NOTE — Discharge Summary (Signed)
Physician Discharge Summary  Hannah Cooper JQZ:[id number] DOB: 1955-11-02 DOA: 01/04/2015  PCP: Maggie Font, MD  Admit date: 01/04/2015 Discharge date: 01/05/2015  Recommendations for Outpatient Follow-up:  1. Follow up with PCP in 1-2 weeks for resolution of shingles. 2. Follow up with cardiologist for possible cardiopulmonary rehab.    Follow-up Information    Follow up with [facility] K, MD. Schedule an appointment as soon as possible for a visit in 2 weeks.   Specialty:  Family Medicine   Contact information:   [address] [place] [address] [phone]       Discharge Diagnoses:  1. Atypical chest pain. . 2. Acute Herpes Zoster/shingles, single dermatone, left chest. 3. Chronic diastolic CHF. 4. COPD. 5. Essential hypertension. 6. Hyperlipidemia. 7. Hypothyroidism.  Discharge Condition: Improved. Disposition: Discharge home.   Diet recommendation: Heart healthy / low sodium diet  Filed Weights   01/05/15 0017  Weight: 99.156 kg (218 lb 9.6 oz)    History of present illness:  59 y.o. female with a hx of HTN, HLD, COPD, and diastolic CHF presented with chest heaviness, nausea, and diarrhea. While in the ED, EKG was unremarkable, CXR was clear, and troponin was negative. Also noted burning and blisters to her left side, similar to prior shingles outbreak. Admitted for further management. Records reveal she had a cardiopulmonary stress test last year, but it was subopitmal.   Hospital Course:  Atypical chest pain likely due to shingles rash given proximity of rash to the left chest wall. CXR, EKG, and serial troponins were all unremarkable. Cardiology  recommended follow up for cardiopulmonary rehab and maintaining a low salt diet. Herpes Zoster below left breast treated with Valtrex. She reports two previous episodes of shingles. Chronic CHF remained stable, CXR clear. All other issues remained stable.   Individual issues as below:  1. Atypical  chest pain. CXR clear, EKG unremarkable, serial troponins negative thus far. ACS ruled out. Cardiology consulted and recommends a low salt diet and possible cardiopulmonary rehab. Suspect related to shingles. 2. Herpes Zoster below left breast. Continue Valtrex. 3. Hypokalemia, resolved. 4. Chronic diastolic CHF, stable. CXR unremarkable. 5. COPD, stable.  6. Essential hypertension, continue home meds 7. Hyperlipidemia, continue statin 8. Hypothyroidism, continue synthroid  Consultants:  cardiology  Procedures:  none  Antibiotics:  none  Discharge Instructions Discharge Instructions    Activity as tolerated - No restrictions    Complete by:  As directed      Diet - low sodium heart healthy    Complete by:  As directed      Discharge instructions    Complete by:  As directed   Call your physician or seek immediate medical attention for worsening pain, shortness of breath, spread of rash or worsening of condition.            Discharge Medication List as of 01/05/2015  2:17 PM    START taking these medications   Details  HYDROcodone-acetaminophen (NORCO/VICODIN) 5-325 MG per tablet Take 1 tablet by mouth every 4 (four) hours as needed for moderate pain., Starting 01/05/2015, Until Discontinued, Print    valACYclovir (VALTREX) 1000 MG tablet Take 1 tablet (1,000 mg total) by mouth 3 (three) times daily., Starting 01/05/2015, Until Discontinued, Normal      CONTINUE these medications which have NOT CHANGED   Details  albuterol (PROVENTIL) (2.5 MG/3ML) 0.083% nebulizer solution Take 2.5 mg by nebulization every 6 (six) hours as needed for wheezing or shortness of breath., Until  Discontinued, Historical Med    ANORO ELLIPTA 62.5-25 MCG/INH AEPB Inhale 1 puff into the lungs daily., Starting 05/27/2014, Until Discontinued, Historical Med    ARNUITY ELLIPTA 100 MCG/ACT AEPB INHALE ONCE DAILY AS DIRECTED, Historical Med    atorvastatin (LIPITOR) 20 MG tablet Take 20 mg by mouth  every evening. , Until Discontinued, Historical Med    cycloSPORINE (RESTASIS) 0.05 % ophthalmic emulsion Place 1 drop into both eyes 2 (two) times daily. , Until Discontinued, Historical Med    famotidine (PEPCID) 40 MG tablet Take 40 mg by mouth 2 (two) times daily as needed for heartburn or indigestion (TAKE 15 MINUTES BEFORE TAKING IBUPROFEN.). , Starting 08/01/2014, Until Discontinued, Historical Med    furosemide (LASIX) 40 MG tablet Take 1 tablet (40 mg total) by mouth daily., Starting 08/20/2014, Until Discontinued, Normal    ibuprofen (ADVIL,MOTRIN) 800 MG tablet Take 1 tablet by mouth 2 (two) times daily as needed (BACK PAIN). , Starting 08/01/2014, Until Discontinued, Historical Med    isosorbide mononitrate (IMDUR) 30 MG 24 hr tablet TAKE 1 TABLET (30 MG TOTAL) BY MOUTH DAILY., Normal    lisinopril (PRINIVIL,ZESTRIL) 20 MG tablet TAKE 1 TABLET BY MOUTH EVERY DAY, Normal    potassium chloride SA (KLOR-CON M20) 20 MEQ tablet Take 1 tablet (20 mEq total) by mouth daily., Starting 08/20/2014, Until Discontinued, Normal    PROAIR RESPICLICK 892 (90 BASE) MCG/ACT AEPB USE 1-2 PUFFS INTO LUNGS 4 TIMES A DAY AS NEEDED FOR SHORTNESS OF BREATH, Historical Med    spironolactone (ALDACTONE) 25 MG tablet Take 0.5 tablets (12.5 mg total) by mouth daily., Starting 10/17/2014, Until Discontinued, Normal      STOP taking these medications     fluorometholone (FML) 0.1 % ophthalmic suspension        Allergies  Allergen Reactions  . Other Swelling    [place] lipstick    The results of significant diagnostics from this hospitalization (including imaging, microbiology, ancillary and laboratory) are listed below for reference.    Significant Diagnostic Studies: Dg Chest 2 View  01/04/2015   CLINICAL DATA:  CHEST PAIN, Patient c/o mid-sternal chest pain that started today while sitting in church. Patient states that she has had heaviness feeling in chest for "past couple of days" but today is when  pain started. Shortness of breath, nausea back pain. Some cough.  EXAM: CHEST  2 VIEW  COMPARISON:  08/14/2014  FINDINGS: Cardiac silhouette is top-normal in size. No mediastinal or hilar masses or evidence of adenopathy.  Clear lungs.  No pleural effusion or pneumothorax.  Bony thorax is demineralized but intact.  IMPRESSION: No active cardiopulmonary disease.   Electronically Signed   By: Lajean Manes M.D.   On: 01/04/2015 18:32      Labs: Basic Metabolic Panel:  Recent Labs Lab 01/04/15 1738 01/05/15 0731  NA 142 140  K 3.2* 4.1  CL 106 106  CO2 27 31  GLUCOSE 90 109*  BUN 11 11  CREATININE 0.72 0.73  CALCIUM 9.3 8.7*   Liver Function Tests:  Recent Labs Lab 01/04/15 1738  AST 19  ALT 20  ALKPHOS 82  BILITOT 0.4  PROT 8.0  ALBUMIN 4.3   CBC:  Recent Labs Lab 01/04/15 1738 01/05/15 0731  WBC 3.7* 3.9*  NEUTROABS 1.8  --   HGB 13.7 11.8*  HCT 39.8 35.7*  MCV 88.8 89.3  PLT 250 236   Cardiac Enzymes:  Recent Labs Lab 01/04/15 1738 01/05/15 0023 01/05/15 0734  TROPONINI <0.03 <  0.03 <0.03      Recent Labs  08/14/14 1030 01/04/15 2024  BNP 26.0 19.0     Principal Problem:   Chest pain Active Problems:   Hypothyroidism   Essential hypertension   Hyperlipidemia   Chronic diastolic CHF (congestive heart failure)   Dyspnea   COPD (chronic obstructive pulmonary disease)   Time coordinating discharge: 35 minutes  Signed:  Murray Hodgkins, MD [facility] 01/05/2015, 9:46 AM  By signing my name below, I, Rosalie Doctor attest that this documentation has been prepared under the direction and in the presence of Murray Hodgkins, MD Electronically signed: Rosalie Doctor, Scribe.  01/05/2015  I personally performed the services described in this documentation. All medical record entries made by the scribe were at my direction. I have reviewed the chart and agree that the record reflects my personal performance and is accurate and  complete. Murray Hodgkins, MD

## 2015-01-05 NOTE — Progress Notes (Addendum)
Pt. Given discharge instructions, prescriptions, and care notes. Pt verbalized understanding AEB no further questions or concerns at this time. Iv was discontinued, no redness, pain, or swelling noted at this time. Telemetry discontinued and Centralized Telemetry was notified. Pt left the floor via wheelchair with staff in stable condition. Pt stated that she would make her own follow up appt. Precious Gilding  RN.

## 2015-01-05 NOTE — Plan of Care (Signed)
Discussed with Dr. Sarajane Jews that the patient is complaining of a headache, nausea r/t pain med, and still in her chest related to the shingles. Patient complained about 0900 of a headache.  I voiced to her that the headache could be related to the Morphine I suggested Tylenol to her and she stated that the Tylenol was not strong enough.  She stated she will wait until time for her nausea med to be given with the Morphine.  Voiced to her that I would notify the MD and ask if the Zorfan time could be changed from every 6 to every 4 hours prn.   The patient is also asking if she may have some crackers since Cardiology has been in to see her.  MD and I reviewed the cardiology note and new orders were given and followed.  I also discussed this with the patient and she verbalized understanding.

## 2015-01-05 NOTE — Progress Notes (Signed)
Per MD patient does not need airborne precautions at this time. Patient informed and educated.

## 2015-01-05 NOTE — Consult Note (Signed)
CARDIOLOGY CONSULT NOTE   Patient ID: Hannah Cooper MRN: [id number] DOB/AGE: Nov 13, 1955 59 y.o.  Admit Date: 01/04/2015 Referring Physician: Isac Sarna MD Primary Physician: Maggie Font, MD Consulting Cardiologist: Dorris Carnes MD Primary Cardiologist:  Carlyle Dolly MD Reason for Consultation: Chest tightness/ pressure    Clinical Summary [name] is a 59 y.o.female with known history of diastolic CHF, hypertension, COPD, hyperlipidemia who was admitted with chest discomfort The patient had a cardiac cath in 2015 demonstrating normal coronary arteries. She states that over 2-3 days prior to coming in she began to have chest pressure/heaviness which was coming and going. She used her inhalers more with relief. She also note some increased LE swelling  Takes fluid pill, but persists    While at church yesterday,  and began to have chest pressure. She sat down and got some better  Did not go away .  Did not want to upset service She went outside and used her inhaler.  She returned to the choir and chest pressure worsened with diaphoresis and weakness. As a result of these symptoms she came to ER. Overall tightness lasted hours.    She states that she moved about 6 weeks ago, and did some lifting and moving furniture at that time, but noting strenuous lately. She also has had worsening back pain from DDD, and is experiencing a shingles outbreak which began on Saturday.  (L chest)  She had some fluid retention and took extra doses of lasix the last couple of days.   Troponin negative X 2. Potassium was found to be 3.2 on admission and has since been replaced. . EKG demonstrated NSR with rate of 76 bpm. On arrival, BP was elevated at 172/98. HR 69. O2 Sat 98%. CXR was negative for CHF or pneumonia. She was treated with ASA, Valtrex, morphine. Admitted to r/o cardiac etiology of chest pain.    Allergies  Allergen Reactions  . Other Swelling    Avon lipstick     Medications Scheduled Medications: . atorvastatin  80 mg Oral q1800  . cycloSPORINE  1 drop Both Eyes BID  . docusate sodium  100 mg Oral BID  . enoxaparin (LOVENOX) injection  40 mg Subcutaneous Q24H  . fluorometholone  1 drop Both Eyes BID  . [START ON 01/06/2015] Influenza vac split quadrivalent PF  0.5 mL Intramuscular Tomorrow-1000  . isosorbide mononitrate  60 mg Oral Daily  . lisinopril  20 mg Oral Daily  . [START ON 01/06/2015] pneumococcal 23 valent vaccine  0.5 mL Intramuscular Tomorrow-1000  . sodium chloride  3 mL Intravenous Q12H  . spironolactone  12.5 mg Oral Daily  . Umeclidinium-Vilanterol  1 puff Inhalation Daily  . valACYclovir  1,000 mg Oral TID    Infusions:    PRN Medications: albuterol, alum & mag hydroxide-simeth, famotidine, hydrALAZINE, LORazepam, morphine injection, ondansetron **OR** ondansetron (ZOFRAN) IV   Past Medical History  Diagnosis Date  . Meningitis   . Hypertension   . Hyperlipidemia   . CHF (congestive heart failure)   . Arthritis   . COPD (chronic obstructive pulmonary disease)     Past Surgical History  Procedure Laterality Date  . Abdominal hysterectomy    . Thyroid surgery  2002  . Left heart catheterization with coronary angiogram N/A 09/05/2012    Procedure: LEFT HEART CATHETERIZATION WITH CORONARY ANGIOGRAM;  Surgeon: Wellington Hampshire, MD;  Location: [facility];  Service: Cardiovascular;  Laterality: N/A;    Family History  Problem Relation  Age of Onset  . Heart attack Mother 15  . Heart attack Sister 66    Social History [name] reports that she has never smoked. She has never used smokeless tobacco. [name] reports that she does not drink alcohol.  Review of Systems Complete review of systems are found to be negative unless outlined in H&P above.  Physical Examination Blood pressure 120/69, pulse 59, temperature 98.2 F (36.8 C), temperature source Oral, resp. rate 16, height 5\' 9"  (1.753 m),  weight 218 lb 9.6 oz (99.156 kg), SpO2 100 %.  Intake/Output Summary (Last 24 hours) at 01/05/15 0928 Last data filed at 01/05/15 0850  Gross per 24 hour  Intake    120 ml  Output      0 ml  Net    120 ml    Telemetry: NSR  GEN: No acute distress HEENT: Conjunctiva and lids normal, oropharynx clear with moist mucosa. Neck: Supple, no elevated JVP or carotid bruits, no thyromegaly. Lungs: Clear to auscultation, diminished in the bases. Cardiac: Regular rate and rhythm, no S3 or significant systolic murmur, no pericardial rub. Chest  Tender under L breast (site of shingles) Abdomen: Soft, nontender, no hepatomegaly, bowel sounds present, no guarding or rebound. Extremities: No pitting edema, distal pulses 2+. Musculoskeletal: No kyphosis. Neuropsychiatric: Alert and oriented x3, affect grossly appropriate.  Prior Cardiac Testing/Procedures 1. Echocardiogram 07/06/2014 Procedure narrative: Transthoracic echocardiography. Image quality was suboptimal. The study was technically difficult, as a result of poor sound wave transmission and restricted patient mobility. - Left ventricle: The cavity size was normal. Wall thickness was increased in a pattern of mild to moderate LVH. Diastolic dysfunction noted, grade indeterminant. Systolic function was normal. The estimated ejection fraction was in the range of 55% to 60%. Wall motion was normal; there were no regional wall motion abnormalities.  Cardiopulmonary Exercise Stress Test 07/01/2014  Notes: Patient gave a very good effort. Pulse-oximetry remained 96-97% throughout the exercise.   ECG: Resting ECG in normal sinus rhythm. There was an in-adequate HR response to the exercise. There were no sustained arrhythmias or ST-T changes throughout the exercise. BP was hypertensive and rest and continued hypertensive through exercise.   PFT: Pre-exercise spirometry demonstrates a Restriction.  MVV was well below  normal. It should be noted that pre-exercise PFT caused "exhaustion". Unclear how much this could have limited the patient's exercise effort.   Conclusion: Exercise testing with gas exchange demonstrates a mild-moderately reduced functional capacity when compared to matched sedentary norms. There was an in-adequate HR response to the exercise response and flat O2 pulse. This high HR reserve was likely high due to pulmonary limitations based on restrictive spirometry and RR 45 at peak exercise and reaching maximum ventilatory limits. However, it could have easily been due to poor effort. The sub-maximal effort is clouding interpretation of the test. Nevertheless, correlation of resting spirometry test Showing restriction with outpatient pulmonary function test is warranted.  Cardiac Cath 09/06/2014  Final Conclusions:  1. Normal coronary arteries. 2. Normal LV systolic function. 3. Moderately elevated left ventricular end-diastolic pressure likely due to diastolic heart failure. Lab Results  Basic Metabolic Panel:  Recent Labs Lab 01/04/15 1738 01/05/15 0731  NA 142 140  K 3.2* 4.1  CL 106 106  CO2 27 31  GLUCOSE 90 109*  BUN 11 11  CREATININE 0.72 0.73  CALCIUM 9.3 8.7*    Liver Function Tests:  Recent Labs Lab 01/04/15 1738  AST 19  ALT 20  ALKPHOS 82  BILITOT 0.4  PROT 8.0  ALBUMIN 4.3    CBC:  Recent Labs Lab 01/04/15 1738 01/05/15 0731  WBC 3.7* 3.9*  NEUTROABS 1.8  --   HGB 13.7 11.8*  HCT 39.8 35.7*  MCV 88.8 89.3  PLT 250 236    Cardiac Enzymes:  Recent Labs Lab 01/04/15 1738 01/05/15 0023 01/05/15 0734  TROPONINI <0.03 <0.03 <0.03    Radiology: Dg Chest 2 View  01/04/2015   CLINICAL DATA:  CHEST PAIN, Patient c/o mid-sternal chest pain that started today while sitting in church. Patient states that she has had heaviness feeling in chest for "past couple of days" but today is when pain started. Shortness of breath, nausea back  pain. Some cough.  EXAM: CHEST  2 VIEW  COMPARISON:  08/14/2014  FINDINGS: Cardiac silhouette is top-normal in size. No mediastinal or hilar masses or evidence of adenopathy.  Clear lungs.  No pleural effusion or pneumothorax.  Bony thorax is demineralized but intact.  IMPRESSION: No active cardiopulmonary disease.   Electronically Signed   By: Lajean Manes M.D.   On: 01/04/2015 18:32     ECG: NSR rate of 76 bpm.    Impression and Recommendations  1. Chest Tightness.   Pt developed yesterday  Lasted hours  EKG negative  Enzymes negative  I do not think spell represents coronary ischemia.  May reflect some volume increase with diastolic CHF in the setting of restrictive lung disease  Volume status looks OK now. I reviewed this with the pt  Will edema important to watch salt intake  Also important to weigh self daily in AM I would not plan cardiac testing at present   With results of CPX last spring I think pt could benefit from cardiopulmonary rehab  Will need to see about insurance  2. Hx of Diastolic CHF:  Volume looks OK now.  Watch salt  Keep up with K    3. Hypertension: She was found to be hypertensive on admission  BP is better now  Again, cardiopulm rehab would be helpfult to document vitals, symptomsn    4. Shingles: She has a small lesion under her left breast which is an open sore. She states this came up on Saturday. She is being treated with Valrex.  5. COPD:See above    6. DDD: Has had worsening back pain. She may need MRI to evaluate musculoskeletal etiology of chest pain.     Signed: Phill Myron. Lawrence NP [facility]  01/05/2015, 9:28 AM    Pt seen and examined  I have amended note abvoe to reflect my findings.   Co-Sign MD

## 2015-02-03 ENCOUNTER — Other Ambulatory Visit: Payer: Self-pay | Admitting: Adult Health

## 2015-02-16 ENCOUNTER — Other Ambulatory Visit: Payer: Self-pay | Admitting: Adult Health

## 2015-03-09 ENCOUNTER — Other Ambulatory Visit: Payer: Self-pay | Admitting: Adult Health

## 2015-03-09 MED ORDER — POTASSIUM CHLORIDE CRYS ER 20 MEQ PO TBCR: 20.0000 meq | EXTENDED_RELEASE_TABLET | Freq: Every day | ORAL | Status: DC

## 2015-03-17 ENCOUNTER — Other Ambulatory Visit: Payer: Self-pay | Admitting: Adult Health

## 2015-03-19 ENCOUNTER — Ambulatory Visit (INDEPENDENT_AMBULATORY_CARE_PROVIDER_SITE_OTHER): Payer: Medicaid Other | Admitting: Cardiology

## 2015-03-19 ENCOUNTER — Encounter: Payer: Self-pay | Admitting: Cardiology

## 2015-03-19 VITALS — BP 144/84 | HR 74 | Ht 69.0 in | Wt 216.0 lb

## 2015-03-19 DIAGNOSIS — I5032 Chronic diastolic (congestive) heart failure: Secondary | ICD-10-CM

## 2015-03-19 DIAGNOSIS — J449 Chronic obstructive pulmonary disease, unspecified: Secondary | ICD-10-CM | POA: Diagnosis not present

## 2015-03-19 DIAGNOSIS — J441 Chronic obstructive pulmonary disease with (acute) exacerbation: Secondary | ICD-10-CM

## 2015-03-19 NOTE — Patient Instructions (Addendum)
Your physician recommends that you schedule a follow-up appointment in: 3 months with Dr Harl Bowie    Take OTC Zantac 150 mg twice a day   You have been referred to cardiac rehab   Your physician recommends that you continue on your current medications as directed. Please refer to the Current Medication list given to you today.    If you need a refill on your cardiac medications before your next appointment, please call your pharmacy.      Thank you for choosing [facility] !

## 2015-03-19 NOTE — Progress Notes (Signed)
Patient ID: Hannah Cooper, female   DOB: 1956-01-23, 59 y.o.   MRN: [id number]     Clinical Summary Hannah Cooper is a 59 y.o.female seen today for follow up of the following medical problems.   1. Chest pain - long history of atypical chest pain - cath 2014 with patent coronaries. - recent admit 12/2014 with chest pain, negative evaluation for ACS.   - notes some recent chest pain. Episode on Thanksgiving while sitting down. Heavy feeling midchest, 9/10. Had headache and neck pain, +SOB. Not positional. Lasted for a few minutes. Similar to her previous pain episodes. Episodes occur approx every other week.   2. COPD - noted on 12/2013 PFTs - followed Dr Luan Pulling  3. HTN - compliant with meds  4. Chronic diastolic heart failure - echo 06/2013 LVEF 55-60%, abnormal diastolic function.  - compliant with lasix. Limiting sodium intake. She is taking mobic and ibuprofen  5. Dyspnea - had CPX 06/2014, showed mild to mod reduced functoinal capacity - symptoms are variable. Symptoms better with breathing treamtnents  6. Back pain - limites her level of exertion  Past Medical History  Diagnosis Date  . Meningitis   . Hypertension   . Hyperlipidemia   . CHF (congestive heart failure) ([place])   . Arthritis   . COPD (chronic obstructive pulmonary disease) ([facility])      Allergies  Allergen Reactions  . Other Swelling    Avon lipstick     Current Outpatient Prescriptions  Medication Sig Dispense Refill  . albuterol (PROVENTIL) (2.5 MG/3ML) 0.083% nebulizer solution Take 2.5 mg by nebulization every 6 (six) hours as needed for wheezing or shortness of breath.    Jearl Klinefelter ELLIPTA 62.5-25 MCG/INH AEPB Inhale 1 puff into the lungs daily.  0  . ARNUITY ELLIPTA 100 MCG/ACT AEPB INHALE ONCE DAILY AS DIRECTED  0  . atorvastatin (LIPITOR) 20 MG tablet Take 20 mg by mouth every evening.     . cycloSPORINE (RESTASIS) 0.05 % ophthalmic emulsion Place 1 drop into Cooper eyes 2 (two) times daily.      . furosemide (LASIX) 40 MG tablet Take 1 tablet (40 mg total) by mouth daily. 90 tablet 3  . HYDROcodone-acetaminophen (NORCO/VICODIN) 5-325 MG per tablet Take 1 tablet by mouth every 4 (four) hours as needed for moderate pain. 30 tablet 0  . ibuprofen (ADVIL,MOTRIN) 800 MG tablet Take 1 tablet by mouth 2 (two) times daily as needed (BACK PAIN).   2  . isosorbide mononitrate (IMDUR) 30 MG 24 hr tablet TAKE 1 TABLET (30 MG TOTAL) BY MOUTH DAILY. 90 tablet 2  . losartan (COZAAR) 100 MG tablet TAKE ONE TABLET BY MOUTH DAILY FOR BLOOD PRESSURE  0  . meloxicam (MOBIC) 7.5 MG tablet Take 7.5 mg by mouth 2 (two) times daily.  0  . potassium chloride SA (KLOR-CON M20) 20 MEQ tablet Take 1 tablet (20 mEq total) by mouth daily. 90 tablet 3  . PROAIR RESPICLICK 123XX123 (90 BASE) MCG/ACT AEPB USE 1-2 PUFFS INTO LUNGS 4 TIMES A DAY AS NEEDED FOR SHORTNESS OF BREATH  0  . spironolactone (ALDACTONE) 25 MG tablet Take 0.5 tablets (12.5 mg total) by mouth daily. 30 tablet 11   No current facility-administered medications for this visit.     Past Surgical History  Procedure Laterality Date  . Abdominal hysterectomy    . Thyroid surgery  2002  . Left heart catheterization with coronary angiogram N/A 09/05/2012    Procedure: LEFT HEART CATHETERIZATION WITH CORONARY  ANGIOGRAM;  Surgeon: Wellington Hampshire, MD;  Location: [facility] CATH LAB;  Service: Cardiovascular;  Laterality: N/A;     Allergies  Allergen Reactions  . Other Swelling    Avon lipstick      Family History  Problem Relation Age of Onset  . Heart attack Mother 28  . Heart attack Sister 84     Social History Hannah Cooper reports that she has never smoked. She has never used smokeless tobacco. Hannah Cooper reports that she does not drink alcohol.   Review of Systems CONSTITUTIONAL: No weight loss, fever, chills, weakness or fatigue.  HEENT: Eyes: No visual loss, blurred vision, double vision or yellow sclerae.No hearing loss, sneezing,  congestion, runny nose or sore throat.  SKIN: No rash or itching.  CARDIOVASCULAR: per hpi RESPIRATORY: +SOB GASTROINTESTINAL: No anorexia, nausea, vomiting or diarrhea. No abdominal pain or blood.  GENITOURINARY: No burning on urination, no polyuria NEUROLOGICAL: No headache, dizziness, syncope, paralysis, ataxia, numbness or tingling in the extremities. No change in bowel or bladder control.  MUSCULOSKELETAL: No muscle, back pain, joint pain or stiffness.  LYMPHATICS: No enlarged nodes. No history of splenectomy.  PSYCHIATRIC: No history of depression or anxiety.  ENDOCRINOLOGIC: No reports of sweating, cold or heat intolerance. No polyuria or polydipsia.  Marland Kitchen   Physical Examination Filed Vitals:   03/19/15 0837  BP: 144/84  Pulse: 74   Filed Weights   03/19/15 0837  Weight: 216 lb (97.977 kg)    Gen: resting comfortably, no acute distress HEENT: no scleral icterus, pupils equal round and reactive, no palptable cervical adenopathy,  CV: RRR, no m/r/g, nojvd Resp: Clear to auscultation bilaterally GI: abdomen is soft, non-tender, non-distended, normal bowel sounds, no hepatosplenomegaly MSK: extremities are warm, no edema.  Skin: warm, no rash Neuro:  no focal deficits Psych: appropriate affect   Diagnostic Studies 08/2012 cath Hemodynamics: AO: 164/82 mmHg LV: 169/14 mmHg LVEDP: 24 mmHg  Coronary angiography: Coronary dominance: Right   Left Main: Normal  Left Anterior Descending (LAD): Normal in size with no significant disease.  1st diagonal (D1): Small in size with minor irregularities.  2nd diagonal (D2): Normal in size with no significant disease.  3rd diagonal (D3): Normal in size with no significant disease.  Circumflex (LCx): Normal in size and nondominant. The vessel has no significant disease.  1st obtuse marginal: Small in size with no significant disease.  2nd obtuse marginal: Small in size with no significant disease.  3rd  obtuse marginal: Large in size with no significant disease.  Right Coronary Artery: Normal in size and dominant. The vessel has no significant disease.  posterior descending artery: Normal in size with no significant disease.  posterior lateral branchs: Normal in size with no significant disease.  Left ventriculography: Left ventricular systolic function is normal , LVEF is estimated at 60-65% %, there is no significant mitral regurgitation   Final Conclusions:  1. Normal coronary arteries. 2. Normal LV systolic function. 3. Moderately elevated left ventricular end-diastolic pressure likely due to diastolic heart failure.   06/2013 echo Study Conclusions  - Procedure narrative: Transthoracic echocardiography. Image quality was suboptimal. The study was technically difficult, as a result of poor sound wave transmission and restricted patient mobility. - Left ventricle: The cavity size was normal. Wall thickness was increased in a pattern of mild to moderate LVH. Diastolic dysfunction noted, grade indeterminant. Systolic function was normal. The estimated ejection fraction was in the range of 55% to 60%. Wall motion was normal; there were no  regional wall motion abnormalities.      Assessment and Plan  1. Chest pain - history of atypical chest pain. Negative cath in 2014 - recommend trial of zantac - continue CAD risk factor modification  2. COPD - per Dr Luan Pulling - significant deconditioning playing a role, she is very sedentary. Will refer to pulmonary rehab  3. HTN - at goal, contineu curret meds  4. Chronic diastolic HF - appears euvolemic - counseled to avoid NSAIDs. Continue current diuretic.    F/u 3 months   Arnoldo Lenis, M.D.

## 2015-03-21 ENCOUNTER — Emergency Department (HOSPITAL_COMMUNITY): Payer: Medicaid Other

## 2015-03-21 ENCOUNTER — Emergency Department (HOSPITAL_COMMUNITY)
Admission: EM | Admit: 2015-03-21 | Discharge: 2015-03-21 | Disposition: A | Discharge status: Home / Self Care | Payer: Medicaid Other | Attending: Emergency Medicine | Admitting: Emergency Medicine

## 2015-03-21 ENCOUNTER — Encounter (HOSPITAL_COMMUNITY): Payer: Self-pay | Admitting: Emergency Medicine

## 2015-03-21 DIAGNOSIS — I509 Heart failure, unspecified: Secondary | ICD-10-CM | POA: Insufficient documentation

## 2015-03-21 DIAGNOSIS — Z8661 Personal history of infections of the central nervous system: Secondary | ICD-10-CM | POA: Insufficient documentation

## 2015-03-21 DIAGNOSIS — I10 Essential (primary) hypertension: Secondary | ICD-10-CM | POA: Diagnosis not present

## 2015-03-21 DIAGNOSIS — Z791 Long term (current) use of non-steroidal anti-inflammatories (NSAID): Secondary | ICD-10-CM | POA: Insufficient documentation

## 2015-03-21 DIAGNOSIS — M1990 Unspecified osteoarthritis, unspecified site: Secondary | ICD-10-CM | POA: Diagnosis not present

## 2015-03-21 DIAGNOSIS — E785 Hyperlipidemia, unspecified: Secondary | ICD-10-CM | POA: Insufficient documentation

## 2015-03-21 DIAGNOSIS — J449 Chronic obstructive pulmonary disease, unspecified: Secondary | ICD-10-CM | POA: Insufficient documentation

## 2015-03-21 DIAGNOSIS — J40 Bronchitis, not specified as acute or chronic: Secondary | ICD-10-CM

## 2015-03-21 DIAGNOSIS — R05 Cough: Secondary | ICD-10-CM | POA: Diagnosis present

## 2015-03-21 DIAGNOSIS — Z79899 Other long term (current) drug therapy: Secondary | ICD-10-CM | POA: Insufficient documentation

## 2015-03-21 LAB — COMPREHENSIVE METABOLIC PANEL
ALK PHOS: 89 U/L (ref 38–126)
ALT: 21 U/L (ref 14–54)
ANION GAP: 6 (ref 5–15)
AST: 20 U/L (ref 15–41)
Albumin: 4.5 g/dL (ref 3.5–5.0)
BUN: 10 mg/dL (ref 6–20)
CALCIUM: 9.5 mg/dL (ref 8.9–10.3)
CO2: 28 mmol/L (ref 22–32)
CREATININE: 0.84 mg/dL (ref 0.44–1.00)
Chloride: 104 mmol/L (ref 101–111)
Glucose, Bld: 96 mg/dL (ref 65–99)
Potassium: 3.5 mmol/L (ref 3.5–5.1)
Sodium: 138 mmol/L (ref 135–145)
TOTAL PROTEIN: 8.5 g/dL — AB (ref 6.5–8.1)
Total Bilirubin: 0.6 mg/dL (ref 0.3–1.2)

## 2015-03-21 LAB — LIPASE, BLOOD: LIPASE: 29 U/L (ref 11–51)

## 2015-03-21 LAB — URINALYSIS, ROUTINE W REFLEX MICROSCOPIC
BILIRUBIN URINE: NEGATIVE
Glucose, UA: NEGATIVE mg/dL
Hgb urine dipstick: NEGATIVE
KETONES UR: NEGATIVE mg/dL
LEUKOCYTES UA: NEGATIVE
NITRITE: NEGATIVE
PROTEIN: NEGATIVE mg/dL
Specific Gravity, Urine: 1.015 (ref 1.005–1.030)
pH: 8 (ref 5.0–8.0)

## 2015-03-21 LAB — CBC WITH DIFFERENTIAL/PLATELET
Basophils Absolute: 0 10*3/uL (ref 0.0–0.1)
Basophils Relative: 0 %
EOS ABS: 0.1 10*3/uL (ref 0.0–0.7)
EOS PCT: 2 %
HCT: 39.6 % (ref 36.0–46.0)
HEMOGLOBIN: 13.5 g/dL (ref 12.0–15.0)
LYMPHS ABS: 1.4 10*3/uL (ref 0.7–4.0)
LYMPHS PCT: 28 %
MCH: 30.3 pg (ref 26.0–34.0)
MCHC: 34.1 g/dL (ref 30.0–36.0)
MCV: 89 fL (ref 78.0–100.0)
MONOS PCT: 6 %
Monocytes Absolute: 0.3 10*3/uL (ref 0.1–1.0)
NEUTROS PCT: 64 %
Neutro Abs: 3.2 10*3/uL (ref 1.7–7.7)
Platelets: 270 10*3/uL (ref 150–400)
RBC: 4.45 MIL/uL (ref 3.87–5.11)
RDW: 13.8 % (ref 11.5–15.5)
WBC: 4.9 10*3/uL (ref 4.0–10.5)

## 2015-03-21 MED ORDER — IBUPROFEN 400 MG PO TABS
ORAL_TABLET | ORAL | Status: AC
  Administered 2015-03-21: 600 mg via ORAL
  Filled 2015-03-21: qty 2

## 2015-03-21 MED ORDER — ACETAMINOPHEN 500 MG PO TABS
1000.0000 mg | ORAL_TABLET | Freq: Once | ORAL | Status: AC
  Administered 2015-03-21: 1000 mg via ORAL
  Filled 2015-03-21: qty 2

## 2015-03-21 MED ORDER — SODIUM CHLORIDE 0.9 % IV BOLUS (SEPSIS)
1000.0000 mL | Freq: Once | INTRAVENOUS | Status: AC
  Administered 2015-03-21: 1000 mL via INTRAVENOUS

## 2015-03-21 MED ORDER — IBUPROFEN 400 MG PO TABS
600.0000 mg | ORAL_TABLET | Freq: Once | ORAL | Status: AC
  Administered 2015-03-21: 600 mg via ORAL

## 2015-03-21 MED ORDER — AZITHROMYCIN 250 MG PO TABS
500.0000 mg | ORAL_TABLET | Freq: Once | ORAL | Status: AC
  Administered 2015-03-21: 500 mg via ORAL
  Filled 2015-03-21: qty 2

## 2015-03-21 MED ORDER — AZITHROMYCIN 250 MG PO TABS: ORAL_TABLET | ORAL | Status: DC

## 2015-03-21 NOTE — ED Provider Notes (Addendum)
CSN: [id number]     Arrival date & time 03/21/15  1429 History   First MD Initiated Contact with Patient 03/21/15 1509     Chief Complaint  Patient presents with  . General Complaint      (Consider location/radiation/quality/duration/timing/severity/associated sxs/prior Treatment) HPI.... Patient complains of general malaise for one week. Low-grade fever. She has been ambulatory and eating. Review systems positive for cough.  She has used her nebulizer machine and inhaler with modest success. No substernal chest pain or productive sputum.  Past Medical History  Diagnosis Date  . Meningitis   . Hypertension   . Hyperlipidemia   . CHF (congestive heart failure) ([facility])   . Arthritis   . COPD (chronic obstructive pulmonary disease) [facility])    Past Surgical History  Procedure Laterality Date  . Abdominal hysterectomy    . Thyroid surgery  2002  . Left heart catheterization with coronary angiogram N/A 09/05/2012    Procedure: LEFT HEART CATHETERIZATION WITH CORONARY ANGIOGRAM;  Surgeon: Wellington Hampshire, MD;  Location: [facility] CATH LAB;  Service: Cardiovascular;  Laterality: N/A;   Family History  Problem Relation Age of Onset  . Heart attack Mother 46  . Heart attack Sister 71   Social History  Substance Use Topics  . Smoking status: Never Smoker   . Smokeless tobacco: Never Used  . Alcohol Use: No   OB History    Gravida Para Term Preterm AB TAB SAB Ectopic Multiple Living            0     Review of Systems  All other systems reviewed and are negative.     Allergies  Other  Home Medications   Prior to Admission medications   Medication Sig Start Date End Date Taking? Authorizing Provider  albuterol (PROVENTIL) (2.5 MG/3ML) 0.083% nebulizer solution Take 2.5 mg by nebulization every 6 (six) hours as needed for wheezing or shortness of breath.   Yes Historical Provider, MD  ANORO ELLIPTA 62.5-25 MCG/INH AEPB Inhale 1 puff into the lungs daily. 05/27/14  Yes Historical  Provider, MD  atorvastatin (LIPITOR) 20 MG tablet Take 20 mg by mouth every evening.    Yes Historical Provider, MD  cycloSPORINE (RESTASIS) 0.05 % ophthalmic emulsion Place 1 drop into both eyes 2 (two) times daily.    Yes Historical Provider, MD  furosemide (LASIX) 40 MG tablet Take 1 tablet (40 mg total) by mouth daily. 08/20/14  Yes Imogene Burn, PA-C  ibuprofen (ADVIL,MOTRIN) 800 MG tablet Take 1 tablet by mouth 2 (two) times daily as needed (BACK PAIN).  08/01/14  Yes Historical Provider, MD  losartan (COZAAR) 100 MG tablet TAKE ONE TABLET BY MOUTH DAILY FOR BLOOD PRESSURE 03/11/15  Yes Historical Provider, MD  meloxicam (MOBIC) 7.5 MG tablet Take 7.5 mg by mouth 2 (two) times daily. 03/10/15  Yes Historical Provider, MD  potassium chloride SA (KLOR-CON M20) 20 MEQ tablet Take 1 tablet (20 mEq total) by mouth daily. 03/09/15  Yes Lendon Colonel, NP  PROAIR RESPICLICK 123XX123 (90 BASE) MCG/ACT AEPB USE 1-2 PUFFS INTO LUNGS 4 TIMES A DAY AS NEEDED FOR SHORTNESS OF BREATH 12/18/14  Yes Historical Provider, MD  ranitidine (ZANTAC) 150 MG capsule Take 150 mg by mouth 2 (two) times daily as needed.    Yes Historical Provider, MD  spironolactone (ALDACTONE) 25 MG tablet Take 0.5 tablets (12.5 mg total) by mouth daily. Patient taking differently: Take 25 mg by mouth daily.  10/17/14  Yes Lendon Colonel, NP  valACYclovir (VALTREX) 1000 MG tablet Take 1,000 mg by mouth 3 (three) times daily as needed (for shingles).  01/05/15  Yes Historical Provider, MD  azithromycin (ZITHROMAX) 250 MG tablet 1 tablet daily starting Sunday evening for 4 more days 03/21/15   Nat Christen, MD  HYDROcodone-acetaminophen (NORCO/VICODIN) 5-325 MG per tablet Take 1 tablet by mouth every 4 (four) hours as needed for moderate pain. Patient not taking: Reported on 03/21/2015 01/05/15   Samuella [facility], MD  isosorbide mononitrate (IMDUR) 30 MG 24 hr tablet TAKE 1 TABLET (30 MG TOTAL) BY MOUTH DAILY. Patient not taking: Reported on  03/21/2015 02/03/15   Lendon Colonel, NP   BP 147/82 mmHg  Pulse 76  Temp(Src) 101.2 F (38.4 C) (Oral)  Resp 21  Ht 5\' 9"  (1.753 m)  Wt 216 lb (97.977 kg)  BMI 31.88 kg/m2  SpO2 94% Physical Exam  Constitutional: She is oriented to person, place, and time.  Patient is nontoxic-appearing.  HENT:  Head: Normocephalic and atraumatic.  Eyes: Conjunctivae and EOM are normal. Pupils are equal, round, and reactive to light.  Neck: Normal range of motion. Neck supple.  Cardiovascular: Normal rate and regular rhythm.   Pulmonary/Chest: Effort normal and breath sounds normal.  Abdominal: Soft. Bowel sounds are normal.  Musculoskeletal: Normal range of motion.  Neurological: She is alert and oriented to person, place, and time.  Skin: Skin is warm and dry.  Psychiatric: She has a normal mood and affect. Her behavior is normal.  Nursing note and vitals reviewed.   ED Course  Procedures (including critical care time) Labs Review Labs Reviewed  COMPREHENSIVE METABOLIC PANEL - Abnormal; Notable for the following:    Total Protein 8.5 (*)    All other components within normal limits  CBC WITH DIFFERENTIAL/PLATELET  URINALYSIS, ROUTINE W REFLEX MICROSCOPIC (NOT AT [facility])  LIPASE, BLOOD    Imaging Review Dg Chest 2 View  03/21/2015  CLINICAL DATA:  Chest pain. Cough. Myalgias. Fever. Nausea. Vomiting. EXAM: CHEST  2 VIEW COMPARISON:  01/04/2015. FINDINGS: Normal sized heart. Minimal linear and ill-defined density at the left lung base. Mild lower thoracic spine degenerative changes. IMPRESSION: Minimal left basilar atelectasis and possible pneumonia. Electronically Signed   By: Claudie Revering M.D.   On: 03/21/2015 15:54   I have personally reviewed and evaluated these images and lab results as part of my medical decision-making.   EKG Interpretation   Date/Time:  Saturday March 21 2015 15:22:59 EST Ventricular Rate:  73 PR Interval:  158 QRS Duration: 76 QT Interval:  438 QTC  Calculation: 483 R Axis:   55 Text Interpretation:  Sinus rhythm Anteroseptal infarct, old Confirmed by  Yuval Rubens  MD, Teandra Harlan ([id number]) on 03/21/2015 4:29:33 PM      MDM   Final diagnoses:  Bronchitis    Patient is alert and oriented with good color. White count normal. Chest x-ray shows minimal left basilar atelectasis, questionable pneumonia. She feels better after IV fluids. Will start Zithromax for her potential infection. Discussed test results with patient and her husband.    Nat Christen, MD 03/21/15 [id number]  Nat Christen, MD 03/21/15 2108

## 2015-03-21 NOTE — Discharge Instructions (Signed)
Chest xray shows a very small area of infection in your lung. Increase fluids. Tylenol for fever. You have been started on an antibiotic tonight. Continue antibiotic tomorrow evening. Follow-up with your primary care doctor.

## 2015-03-21 NOTE — ED Notes (Signed)
Pt states understanding of care given and follow up instructions 

## 2015-03-21 NOTE — ED Notes (Signed)
Pt states that she has not been feeling well for the past week or so.  States that she hurts all over, coughing, is short of breath at times, and is very fatigued.

## 2015-03-23 ENCOUNTER — Telehealth: Payer: Self-pay

## 2015-03-23 DIAGNOSIS — I5032 Chronic diastolic (congestive) heart failure: Secondary | ICD-10-CM

## 2015-03-23 NOTE — Telephone Encounter (Signed)
Referral cx to cardiac rehab,referred to pulmonary rehab per Dr Harl Bowie

## 2015-03-23 NOTE — Telephone Encounter (Signed)
-----   Message from Bernita Raisin, RN sent at 03/23/2015 10:24 AM EST ----- chronic diastolic heart failure this dx can go to pulmonary rehab and will be paid

## 2015-03-31 ENCOUNTER — Encounter (HOSPITAL_COMMUNITY): Payer: Medicaid Other

## 2015-03-31 ENCOUNTER — Encounter (HOSPITAL_COMMUNITY)
Admission: RE | Admit: 2015-03-31 | Discharge: 2015-03-31 | Disposition: A | Discharge status: Home / Self Care | Payer: Medicaid Other | Source: Ambulatory Visit | Attending: Cardiology | Admitting: Cardiology

## 2015-03-31 VITALS — BP 138/84 | HR 69 | Ht 66.0 in | Wt 214.6 lb

## 2015-03-31 DIAGNOSIS — I5022 Chronic systolic (congestive) heart failure: Secondary | ICD-10-CM | POA: Diagnosis present

## 2015-03-31 DIAGNOSIS — I5032 Chronic diastolic (congestive) heart failure: Secondary | ICD-10-CM

## 2015-03-31 NOTE — Progress Notes (Signed)
Cardiac/Pulmonary Rehab Medication Review by a Pharmacist  Does the patient  feel that his/her medications are working for him/her?  yes  Has the patient been experiencing any side effects to the medications prescribed?  no  Does the patient measure his/her own blood pressure or blood glucose at home?  yes   Does the patient have any problems obtaining medications due to transportation or finances?   no  Understanding of regimen: excellent Understanding of indications: excellent Potential of compliance: excellent  Questions asked to Determine Patient Understanding of Medication Regimen:  1. What is the name of the medication?  2. What is the medication used for?  3. When should it be taken?  4. How much should be taken?  5. How will you take it?  6. What side effects should you report?  Understanding Defined as: Excellent: All questions above are correct Good: Questions 1-4 are correct Fair: Questions 1-2 are correct  Poor: 1 or none of the above questions are correct   Pharmacist comments: Pt is not c/o any side effects.  Some medications have been completed / no longer taking.  Pt does check BP at home but not blood sugar.    Hannah Cooper A 03/31/2015 9:42 AM

## 2015-03-31 NOTE — Progress Notes (Signed)
Patient arrived for 1st visit/orientation/education at 0800. Patient was referred to PR by Dr. Harl Bowie due to Chronic diastolic heart failure [id number]. During orientation advised patient on arrival and appointment times what to wear, what to do before, during and after exercise. Reviewed attendance and class policy. Talked about inclement weather and class consultation policy. Pt is scheduled to return Pulm Rehab on 04/07/15 at 1045. Pt was advised to come to class 15 minutes before class starts. She was also given instructions on meeting with the dietician and attending the Family Structure classes. Pt is eager to get started. Patient was able to complete 6 minute walk test. Patient experienced back pain and fatigue. Pain 7 out of 10. Patient did not have any other abnormal S/S. Patient was measured for the equipment. Discussed equipment safety with patient. Took patient pre-anthropometric measurements. Patient scored 1 on PHQ-2 and 5 on PHQ-9. Told patient about counseling. Patient stated she did not feel she needed counseling. She feels her feeling down at times relates more to her recent health problems and not anything emotional. Patient finished visit at 1030.

## 2015-03-31 NOTE — Patient Instructions (Signed)
Pt has finished orientation/education and is scheduled to start PR on 04/07/15 at 10:45 am. Pt has been instructed to arrive to class 15 minutes early for scheduled class. Pt has been instructed to wear comfortable clothing and shoes with rubber soles. Pt has been told to take their medications 1 hour prior to coming to class.  If the patient is not going to attend class, he/she has been instructed to call.

## 2015-04-07 ENCOUNTER — Encounter (HOSPITAL_COMMUNITY)
Admission: RE | Admit: 2015-04-07 | Discharge: 2015-04-07 | Disposition: A | Discharge status: Home / Self Care | Payer: Medicaid Other | Source: Ambulatory Visit | Attending: Cardiology | Admitting: Cardiology

## 2015-04-07 DIAGNOSIS — I5022 Chronic systolic (congestive) heart failure: Secondary | ICD-10-CM | POA: Diagnosis not present

## 2015-04-09 ENCOUNTER — Encounter (HOSPITAL_COMMUNITY)
Admission: RE | Admit: 2015-04-09 | Discharge: 2015-04-09 | Disposition: A | Discharge status: Home / Self Care | Payer: Medicaid Other | Source: Ambulatory Visit | Attending: Cardiology | Admitting: Cardiology

## 2015-04-09 ENCOUNTER — Telehealth: Payer: Self-pay

## 2015-04-09 DIAGNOSIS — I5022 Chronic systolic (congestive) heart failure: Secondary | ICD-10-CM | POA: Diagnosis not present

## 2015-04-09 MED ORDER — AMLODIPINE BESYLATE 2.5 MG PO TABS: 2.5000 mg | ORAL_TABLET | Freq: Every day | ORAL | Status: DC

## 2015-04-09 NOTE — Telephone Encounter (Signed)
Pt aware, e-scribed med

## 2015-04-09 NOTE — Telephone Encounter (Signed)
-----   Message from Lendon Colonel, NP sent at 04/09/2015  3:44 PM EST ----- Regarding: Hypertension Patient hypertensive prior to pulmonary rehab with BP 160's with some minimal decrease during exercise. Please start amlodipine 2.5 mg daily. Have her take it in am, specially before cardiac rehab.

## 2015-04-14 ENCOUNTER — Encounter (HOSPITAL_COMMUNITY)
Admission: RE | Admit: 2015-04-14 | Discharge: 2015-04-14 | Disposition: A | Discharge status: Home / Self Care | Payer: Medicaid Other | Source: Ambulatory Visit | Attending: Cardiology | Admitting: Cardiology

## 2015-04-14 DIAGNOSIS — I5022 Chronic systolic (congestive) heart failure: Secondary | ICD-10-CM | POA: Diagnosis not present

## 2015-04-16 ENCOUNTER — Encounter (HOSPITAL_COMMUNITY)
Admission: RE | Admit: 2015-04-16 | Discharge: 2015-04-16 | Disposition: A | Discharge status: Home / Self Care | Payer: Medicaid Other | Source: Ambulatory Visit | Attending: Cardiology | Admitting: Cardiology

## 2015-04-16 DIAGNOSIS — I5022 Chronic systolic (congestive) heart failure: Secondary | ICD-10-CM | POA: Diagnosis not present

## 2015-04-21 ENCOUNTER — Encounter (HOSPITAL_COMMUNITY): Payer: Medicaid Other

## 2015-04-23 ENCOUNTER — Encounter (HOSPITAL_COMMUNITY)
Admission: RE | Admit: 2015-04-23 | Discharge: 2015-04-23 | Disposition: A | Discharge status: Home / Self Care | Payer: Medicaid Other | Source: Ambulatory Visit | Attending: Cardiology | Admitting: Cardiology

## 2015-04-23 DIAGNOSIS — I5022 Chronic systolic (congestive) heart failure: Secondary | ICD-10-CM | POA: Diagnosis not present

## 2015-04-24 ENCOUNTER — Encounter: Payer: Self-pay | Admitting: Cardiology

## 2015-04-24 ENCOUNTER — Ambulatory Visit (INDEPENDENT_AMBULATORY_CARE_PROVIDER_SITE_OTHER): Payer: Medicaid Other | Admitting: Cardiology

## 2015-04-24 VITALS — BP 146/90 | HR 96 | Ht 66.0 in | Wt 214.0 lb

## 2015-04-24 DIAGNOSIS — I10 Essential (primary) hypertension: Secondary | ICD-10-CM | POA: Diagnosis not present

## 2015-04-24 DIAGNOSIS — I5032 Chronic diastolic (congestive) heart failure: Secondary | ICD-10-CM | POA: Diagnosis not present

## 2015-04-24 DIAGNOSIS — R079 Chest pain, unspecified: Secondary | ICD-10-CM

## 2015-04-24 MED ORDER — AMLODIPINE BESYLATE 5 MG PO TABS: 5.0000 mg | ORAL_TABLET | Freq: Every day | ORAL | Status: DC

## 2015-04-24 NOTE — Patient Instructions (Signed)
Your physician recommends that you schedule a follow-up appointment in: 3 months with Dr Harl Bowie    INCREASE Norvasc to 5 mg daily-take 2 tablets of your 2.5 mg pills until finished    If you need a refill on your cardiac medications before your next appointment, please call your pharmacy.   Thank you for choosing [facility] !

## 2015-04-24 NOTE — Progress Notes (Signed)
Patient ID: Hannah Cooper, female   DOB: 25-Aug-1955, 60 y.o.   MRN: [id number]     Clinical Summary [name] is a 60 y.o.female seen today for follow up of the following medical problems.   1. Chest pain - long history of atypical chest pain - cath 2014 with patent coronaries. - recent admit 12/2014 with chest pain, negative evaluation for ACS.   - notes some recent chest pain. Episode on Thanksgiving while sitting down. Heavy feeling midchest, 9/10. Had headache and neck pain, +SOB. Not positional. Lasted for a few minutes. Similar to her previous pain episodes. Episodes occur approx every other week. Overall symptoms are unchanged since last visit.   2. COPD - noted on 12/2013 PFTs - followed Dr Luan Pulling  3. HTN - compliant with meds - has had some high blood pressures at home and in rehab. Typically 150-160/90s. Recently started on norvasc 2.5mg  daily however bp remains elevated.    4. Chronic diastolic heart failure - echo 06/2013 LVEF 55-60%, abnormal diastolic function.  - compliant with lasix.   5. Dyspnea - had CPX 06/2014, showed mild to mod reduced functoinal capacity - symptoms are variable. Symptoms better with breathing treamtnents   Past Medical History  Diagnosis Date  . Meningitis   . Hypertension   . Hyperlipidemia   . CHF (congestive heart failure) ([place] [place])   . Arthritis   . COPD (chronic obstructive pulmonary disease) ([facility])      Allergies  Allergen Reactions  . Other Swelling    Avon lipstick     Current Outpatient Prescriptions  Medication Sig Dispense Refill  . albuterol (PROVENTIL) (2.5 MG/3ML) 0.083% nebulizer solution Take 2.5 mg by nebulization every 6 (six) hours as needed for wheezing or shortness of breath.    Marland Kitchen amLODipine (NORVASC) 2.5 MG tablet Take 1 tablet (2.5 mg total) by mouth daily. 90 tablet 3  . ANORO ELLIPTA 62.5-25 MCG/INH AEPB Inhale 1 puff into the lungs daily.  0  . atorvastatin (LIPITOR) 20 MG tablet Take 20 mg by  mouth every evening.     . cycloSPORINE (RESTASIS) 0.05 % ophthalmic emulsion Place 1 drop into Cooper eyes 2 (two) times daily.     . furosemide (LASIX) 40 MG tablet Take 1 tablet (40 mg total) by mouth daily. 90 tablet 3  . HYDROcodone-acetaminophen (NORCO/VICODIN) 5-325 MG per tablet Take 1 tablet by mouth every 4 (four) hours as needed for moderate pain. 30 tablet 0  . ibuprofen (ADVIL,MOTRIN) 800 MG tablet Take 1 tablet by mouth 2 (two) times daily as needed (BACK PAIN).   2  . isosorbide mononitrate (IMDUR) 30 MG 24 hr tablet TAKE 1 TABLET (30 MG TOTAL) BY MOUTH DAILY. 90 tablet 2  . losartan (COZAAR) 100 MG tablet TAKE ONE TABLET BY MOUTH DAILY FOR BLOOD PRESSURE  0  . meloxicam (MOBIC) 7.5 MG tablet Take 7.5 mg by mouth 2 (two) times daily.  0  . potassium chloride SA (KLOR-CON M20) 20 MEQ tablet Take 1 tablet (20 mEq total) by mouth daily. 90 tablet 3  . PROAIR RESPICLICK [id number] (90 BASE) MCG/ACT AEPB USE 1-2 PUFFS INTO LUNGS 4 TIMES A DAY AS NEEDED FOR SHORTNESS OF BREATH  0  . ranitidine (ZANTAC) 150 MG capsule Take 150 mg by mouth 2 (two) times daily as needed.     Marland Kitchen spironolactone (ALDACTONE) 25 MG tablet Take 0.5 tablets (12.5 mg total) by mouth daily. (Patient taking differently: Take 25 mg by mouth daily. ) 30 tablet  11  . valACYclovir (VALTREX) 1000 MG tablet Take 1,000 mg by mouth 3 (three) times daily as needed (for shingles).   0   No current facility-administered medications for this visit.     Past Surgical History  Procedure Laterality Date  . Abdominal hysterectomy    . Thyroid surgery  2002  . Left heart catheterization with coronary angiogram N/A 09/05/2012    Procedure: LEFT HEART CATHETERIZATION WITH CORONARY ANGIOGRAM;  Surgeon: Wellington Hampshire, MD;  Location: [facility] CATH [facility];  Service: Cardiovascular;  Laterality: N/A;     Allergies  Allergen Reactions  . Other Swelling    Avon lipstick      Family History  Problem Relation Age of Onset  . Heart attack Mother  74  . Heart attack Sister 69     Social History [name] reports that she has never smoked. She has never used smokeless tobacco. [name] reports that she does not drink alcohol.   Review of Systems CONSTITUTIONAL: No weight loss, fever, chills, weakness or fatigue.  HEENT: Eyes: No visual loss, blurred vision, double vision or yellow sclerae.No hearing loss, sneezing, congestion, runny nose or sore throat.  SKIN: No rash or itching.  CARDIOVASCULAR: per HPI RESPIRATORY: +dyspnea  GASTROINTESTINAL: No anorexia, nausea, vomiting or diarrhea. No abdominal pain or blood.  GENITOURINARY: No burning on urination, no polyuria NEUROLOGICAL: No headache, dizziness, syncope, paralysis, ataxia, numbness or tingling in the extremities. No change in bowel or bladder control.  MUSCULOSKELETAL: back pain LYMPHATICS: No enlarged nodes. No history of splenectomy.  PSYCHIATRIC: No history of depression or anxiety.  ENDOCRINOLOGIC: No reports of sweating, cold or heat intolerance. No polyuria or polydipsia.  Marland Kitchen   Physical Examination Filed Vitals:   04/24/15 0814  BP: 146/90  Pulse: 96   Filed Weights   04/24/15 0814  Weight: 214 lb (97.07 kg)    Gen: resting comfortably, no acute distress HEENT: no scleral icterus, pupils equal round and reactive, no palptable cervical adenopathy,  CV: RRR, no m/r/g, no jvd Resp: Clear to auscultation bilaterally GI: abdomen is soft, non-tender, non-distended, normal bowel sounds, no hepatosplenomegaly MSK: extremities are warm, no edema.  Skin: warm, no rash Neuro:  no focal deficits Psych: appropriate affect   Diagnostic Studies 08/2012 cath Hemodynamics: AO: 164/82 mmHg LV: 169/14 mmHg LVEDP: 24 mmHg  Coronary angiography: Coronary dominance: Right   Left Main: Normal  Left Anterior Descending (LAD): Normal in size with no significant disease.  1st diagonal (D1): Small in size with minor irregularities.  2nd  diagonal (D2): Normal in size with no significant disease.  3rd diagonal (D3): Normal in size with no significant disease.  Circumflex (LCx): Normal in size and nondominant. The vessel has no significant disease.  1st obtuse marginal: Small in size with no significant disease.  2nd obtuse marginal: Small in size with no significant disease.  3rd obtuse marginal: Large in size with no significant disease.  Right Coronary Artery: Normal in size and dominant. The vessel has no significant disease.  posterior descending artery: Normal in size with no significant disease.  posterior lateral branchs: Normal in size with no significant disease.  Left ventriculography: Left ventricular systolic function is normal , LVEF is estimated at 60-65% %, there is no significant mitral regurgitation   Final Conclusions:  1. Normal coronary arteries. 2. Normal LV systolic function. 3. Moderately elevated left ventricular end-diastolic pressure likely due to diastolic heart failure.   06/2013 echo Study Conclusions  - Procedure narrative: Transthoracic  echocardiography. Image quality was suboptimal. The study was technically difficult, as a result of poor sound wave transmission and restricted patient mobility. - Left ventricle: The cavity size was normal. Wall thickness was increased in a pattern of mild to moderate LVH. Diastolic dysfunction noted, grade indeterminant. Systolic function was normal. The estimated ejection fraction was in the range of 55% to 60%. Wall motion was normal; there were no regional wall motion abnormalities.     Assessment and Plan   1. Chest pain - history of atypical chest pain. Negative cath in 2014 - recommended trial of zantac last visit but she has not tried, she will try for the next month - continue CAD risk factor modification  2. COPD - per Dr Luan Pulling - continue pulmonary rehab  3. HTN - not controlled, increase norvasc  to 5mg  daily. She will submit bp log in 1 week.   4. Chronic diastolic HF - appears euvolemic -  Continue current diuretic.   F/u 3 months  Arnoldo Lenis, M.D.

## 2015-04-26 ENCOUNTER — Other Ambulatory Visit: Payer: Self-pay

## 2015-04-26 ENCOUNTER — Emergency Department (HOSPITAL_COMMUNITY)
Admission: EM | Admit: 2015-04-26 | Discharge: 2015-04-26 | Disposition: A | Discharge status: Home / Self Care | Payer: Medicaid Other | Attending: Emergency Medicine | Admitting: Emergency Medicine

## 2015-04-26 ENCOUNTER — Encounter (HOSPITAL_COMMUNITY): Payer: Self-pay | Admitting: *Deleted

## 2015-04-26 ENCOUNTER — Emergency Department (HOSPITAL_COMMUNITY): Payer: Medicaid Other

## 2015-04-26 DIAGNOSIS — M1990 Unspecified osteoarthritis, unspecified site: Secondary | ICD-10-CM | POA: Diagnosis not present

## 2015-04-26 DIAGNOSIS — Z9889 Other specified postprocedural states: Secondary | ICD-10-CM | POA: Diagnosis not present

## 2015-04-26 DIAGNOSIS — Z79899 Other long term (current) drug therapy: Secondary | ICD-10-CM | POA: Diagnosis not present

## 2015-04-26 DIAGNOSIS — Z791 Long term (current) use of non-steroidal anti-inflammatories (NSAID): Secondary | ICD-10-CM | POA: Diagnosis not present

## 2015-04-26 DIAGNOSIS — Z8661 Personal history of infections of the central nervous system: Secondary | ICD-10-CM | POA: Diagnosis not present

## 2015-04-26 DIAGNOSIS — I10 Essential (primary) hypertension: Secondary | ICD-10-CM | POA: Insufficient documentation

## 2015-04-26 DIAGNOSIS — I509 Heart failure, unspecified: Secondary | ICD-10-CM | POA: Diagnosis not present

## 2015-04-26 DIAGNOSIS — R55 Syncope and collapse: Secondary | ICD-10-CM | POA: Insufficient documentation

## 2015-04-26 DIAGNOSIS — R110 Nausea: Secondary | ICD-10-CM | POA: Diagnosis not present

## 2015-04-26 DIAGNOSIS — R600 Localized edema: Secondary | ICD-10-CM | POA: Insufficient documentation

## 2015-04-26 DIAGNOSIS — E785 Hyperlipidemia, unspecified: Secondary | ICD-10-CM | POA: Insufficient documentation

## 2015-04-26 DIAGNOSIS — R51 Headache: Secondary | ICD-10-CM | POA: Diagnosis not present

## 2015-04-26 DIAGNOSIS — J441 Chronic obstructive pulmonary disease with (acute) exacerbation: Secondary | ICD-10-CM | POA: Insufficient documentation

## 2015-04-26 LAB — CBC WITH DIFFERENTIAL/PLATELET
BASOS PCT: 0 %
Basophils Absolute: 0 10*3/uL (ref 0.0–0.1)
EOS ABS: 0.2 10*3/uL (ref 0.0–0.7)
EOS PCT: 5 %
HCT: 38.7 % (ref 36.0–46.0)
HEMOGLOBIN: 12.9 g/dL (ref 12.0–15.0)
LYMPHS ABS: 1.5 10*3/uL (ref 0.7–4.0)
Lymphocytes Relative: 43 %
MCH: 29.9 pg (ref 26.0–34.0)
MCHC: 33.3 g/dL (ref 30.0–36.0)
MCV: 89.8 fL (ref 78.0–100.0)
MONO ABS: 0.3 10*3/uL (ref 0.1–1.0)
MONOS PCT: 8 %
NEUTROS PCT: 44 %
Neutro Abs: 1.6 10*3/uL — ABNORMAL LOW (ref 1.7–7.7)
PLATELETS: 245 10*3/uL (ref 150–400)
RBC: 4.31 MIL/uL (ref 3.87–5.11)
RDW: 13.7 % (ref 11.5–15.5)
WBC: 3.6 10*3/uL — ABNORMAL LOW (ref 4.0–10.5)

## 2015-04-26 LAB — BASIC METABOLIC PANEL
Anion gap: 8 (ref 5–15)
BUN: 15 mg/dL (ref 6–20)
CALCIUM: 9.6 mg/dL (ref 8.9–10.3)
CO2: 28 mmol/L (ref 22–32)
CREATININE: 0.9 mg/dL (ref 0.44–1.00)
Chloride: 104 mmol/L (ref 101–111)
GFR calc non Af Amer: >60 mL/min (ref >60)
Glucose, Bld: 122 mg/dL — ABNORMAL HIGH (ref 65–99)
Potassium: 3.3 mmol/L — ABNORMAL LOW (ref 3.5–5.1)
Sodium: 140 mmol/L (ref 135–145)

## 2015-04-26 NOTE — Discharge Instructions (Signed)
Syncope °Syncope is a medical term for fainting or passing out. This means you lose consciousness and drop to the ground. People are generally unconscious for less than 5 minutes. You may have some muscle twitches for up to 15 seconds before waking up and returning to normal. Syncope occurs more often in older adults, but it can happen to anyone. While most causes of syncope are not dangerous, syncope can be a sign of a serious medical problem. It is important to seek medical care.  °CAUSES  °Syncope is caused by a sudden drop in blood flow to the brain. The specific cause is often not determined. Factors that can bring on syncope include: °· Taking medicines that lower blood pressure. °· Sudden changes in posture, such as standing up quickly. °· Taking more medicine than prescribed. °· Standing in one place for too long. °· Seizure disorders. °· Dehydration and excessive exposure to heat. °· Low blood sugar (hypoglycemia). °· Straining to have a bowel movement. °· Heart disease, irregular heartbeat, or other circulatory problems. °· Fear, emotional distress, seeing blood, or severe pain. °SYMPTOMS  °Right before fainting, you may: °· Feel dizzy or light-headed. °· Feel nauseous. °· See all white or all black in your field of vision. °· Have cold, clammy skin. °DIAGNOSIS  °Your health care provider will ask about your symptoms, perform a physical exam, and perform an electrocardiogram (ECG) to record the electrical activity of your heart. Your health care provider may also perform other heart or blood tests to determine the cause of your syncope which may include: °· Transthoracic echocardiogram (TTE). During echocardiography, sound waves are used to evaluate how blood flows through your heart. °· Transesophageal echocardiogram (TEE). °· Cardiac monitoring. This allows your health care provider to monitor your heart rate and rhythm in real time. °· Holter monitor. This is a portable device that records your  heartbeat and can help diagnose heart arrhythmias. It allows your health care provider to track your heart activity for several days, if needed. °· Stress tests by exercise or by giving medicine that makes the heart beat faster. °TREATMENT  °In most cases, no treatment is needed. Depending on the cause of your syncope, your health care provider may recommend changing or stopping some of your medicines. °HOME CARE INSTRUCTIONS °· Have someone stay with you until you feel stable. °· Do not drive, use machinery, or play sports until your health care provider says it is okay. °· Keep all follow-up appointments as directed by your health care provider. °· Lie down right away if you start feeling like you might faint. Breathe deeply and steadily. Wait until all the symptoms have passed. °· Drink enough fluids to keep your urine clear or pale yellow. °· If you are taking blood pressure or heart medicine, get up slowly and take several minutes to sit and then stand. This can reduce dizziness. °SEEK IMMEDIATE MEDICAL CARE IF:  °· You have a severe headache. °· You have unusual pain in the chest, abdomen, or back. °· You are bleeding from your mouth or rectum, or you have black or tarry stool. °· You have an irregular or very fast heartbeat. °· You have pain with breathing. °· You have repeated fainting or seizure-like jerking during an episode. °· You faint when sitting or lying down. °· You have confusion. °· You have trouble walking. °· You have severe weakness. °· You have vision problems. °If you fainted, call your local emergency services (911 in [place] Do not drive   yourself to the hospital.    This information is not intended to replace advice given to you by your health care provider. Make sure you discuss any questions you have with your health care provider.   Document Released: 04/04/2005 Document Revised: 08/19/2014 Document Reviewed: 06/03/2011 Elsevier Interactive Patient Education 2016 [employer].  Syncope, commonly known as fainting, is a temporary loss of consciousness. It occurs when the blood flow to the brain is reduced. Vasovagal syncope (also called neurocardiogenic syncope) is a fainting spell in which the blood flow to the brain is reduced because of a sudden drop in heart rate and blood pressure. Vasovagal syncope occurs when the brain and the cardiovascular system (blood vessels) do not adequately communicate and respond to each other. This is the most common cause of fainting. It often occurs in response to fear or some other type of emotional or physical stress. The body has a reaction in which the heart starts beating too slowly or the blood vessels expand, reducing blood pressure. This type of fainting spell is generally considered harmless. However, injuries can occur if a person takes a sudden fall during a fainting spell.  CAUSES  Vasovagal syncope occurs when a person's blood pressure and heart rate decrease suddenly, usually in response to a trigger. Many things and situations can trigger an episode. Some of these include:   Pain.   Fear.   The sight of blood or medical procedures, such as blood being drawn from a vein.   Common activities, such as coughing, swallowing, stretching, or going to the bathroom.   Emotional stress.   Prolonged standing, especially in a warm environment.   Lack of sleep or rest.   Prolonged lack of food.   Prolonged lack of fluids.   Recent illness.  The use of certain drugs that affect blood pressure, such as cocaine, alcohol, marijuana, inhalants, and opiates.  SYMPTOMS  Before the fainting episode, you may:   Feel dizzy or light headed.   Become pale.  Sense that you are going to faint.   Feel like the room is spinning.   Have tunnel vision, only seeing directly in front of you.   Feel sick to your stomach (nauseous).   See spots or slowly lose vision.   Hear ringing in your ears.   Have a  headache.   Feel warm and sweaty.   Feel a sensation of pins and needles. During the fainting spell, you will generally be unconscious for no longer than a couple minutes before waking up and returning to normal. If you get up too quickly before your body can recover, you may faint again. Some twitching or jerky movements may occur during the fainting spell.  DIAGNOSIS  Your health care provider will ask about your symptoms, take a medical history, and perform a physical exam. Various tests may be done to rule out other causes of fainting. These may include blood tests and tests to check the heart, such as electrocardiography, echocardiography, and possibly an electrophysiology study. When other causes have been ruled out, a test may be done to check the body's response to changes in position (tilt table test). TREATMENT  Most cases of vasovagal syncope do not require treatment. Your health care provider may recommend ways to avoid fainting triggers and may provide home strategies for preventing fainting. If you must be exposed to a possible trigger, you can drink additional fluids to help reduce your chances of having an episode of vasovagal syncope. If you  have warning signs of an oncoming episode, you can respond by positioning yourself favorably (lying down). If your fainting spells continue, you may be given medicines to prevent fainting. Some medicines may help make you more resistant to repeated episodes of vasovagal syncope. Special exercises or compression stockings may be recommended. In rare cases, the surgical placement of a pacemaker is considered. HOME CARE INSTRUCTIONS   Learn to identify the warning signs of vasovagal syncope.   Sit or lie down at the first warning sign of a fainting spell. If sitting, put your head down between your legs. If you lie down, swing your legs up in the air to increase blood flow to the brain.   Avoid hot tubs and saunas.  Avoid prolonged  standing.  Drink enough fluids to keep your urine clear or pale yellow. Avoid caffeine.  Increase salt in your diet as directed by your health care provider.   If you have to stand for a long time, perform movements such as:   Crossing your legs.   Flexing and stretching your leg muscles.   Squatting.   Moving your legs.   Bending over.   Only take over-the-counter or prescription medicines as directed by your health care provider. Do not suddenly stop any medicines without asking your health care provider first. [place] IF:   Your fainting spells continue or happen more frequently in spite of treatment.   You lose consciousness for more than a couple minutes.  You have fainting spells during or after exercising or after being startled.   You have new symptoms that occur with the fainting spells, such as:   Shortness of breath.  Chest pain.   Irregular heartbeat.   You have episodes of twitching or jerky movements that last longer than a few seconds.  You have episodes of twitching or jerky movements without obvious fainting. SEEK IMMEDIATE MEDICAL CARE IF:   You have injuries or bleeding after a fainting spell.   You have episodes of twitching or jerky movements that last longer than 5 minutes.   You have more than one spell of twitching or jerky movements before returning to consciousness after fainting.   This information is not intended to replace advice given to you by your health care provider. Make sure you discuss any questions you have with your health care provider.   Document Released: 03/21/2012 Document Revised: 08/19/2014 Document Reviewed: 03/21/2012 Elsevier Interactive Patient Education [employer].

## 2015-04-26 NOTE — ED Provider Notes (Signed)
CSN: [id number]     Arrival date & time 04/26/15  2117 History   First MD Initiated Contact with Patient 04/26/15 2140     Chief Complaint  Patient presents with  . Near Syncope     (Consider location/radiation/quality/duration/timing/severity/associated sxs/prior Treatment) Patient is a 60 y.o. female presenting with near-syncope. The history is provided by the patient.  Near Syncope This is a new problem. Associated symptoms include headaches and shortness of breath. Pertinent negatives include no chest pain and no abdominal pain.   patient presents after an episode of syncope. States she is feeling little short of breath and thought she may have given breathing treatment. She is also feeling nauseous. She had a dull headache. She then passed out. She did not know is common. Shown on the floor by her husband. Since she did feel bad. No fevers or chills. No cough. No chest pain. She's been doing well recently. No localizing numbness or weakness. No abdominal pain. She's not had syncopal episodes before.  Past Medical History  Diagnosis Date  . Meningitis   . Hypertension   . Hyperlipidemia   . CHF (congestive heart failure) ([place])   . Arthritis   . COPD (chronic obstructive pulmonary disease) [facility])    Past Surgical History  Procedure Laterality Date  . Abdominal hysterectomy    . Thyroid surgery  2002  . Left heart catheterization with coronary angiogram N/A 09/05/2012    Procedure: LEFT HEART CATHETERIZATION WITH CORONARY ANGIOGRAM;  Surgeon: Wellington Hampshire, MD;  Location: [facility] CATH LAB;  Service: Cardiovascular;  Laterality: N/A;   Family History  Problem Relation Age of Onset  . Heart attack Mother 84  . Heart attack Sister 53   Social History  Substance Use Topics  . Smoking status: Never Smoker   . Smokeless tobacco: Never Used  . Alcohol Use: No   OB History    Gravida Para Term Preterm AB TAB SAB Ectopic Multiple Living            0     Review of Systems   Constitutional: Negative for activity change and appetite change.  Eyes: Negative for pain.  Respiratory: Positive for shortness of breath. Negative for chest tightness.   Cardiovascular: Positive for near-syncope. Negative for chest pain and leg swelling.  Gastrointestinal: Positive for nausea. Negative for vomiting, abdominal pain and diarrhea.  Genitourinary: Negative for flank pain.  Musculoskeletal: Negative for back pain and neck stiffness.  Skin: Negative for rash.  Neurological: Positive for syncope and headaches. Negative for weakness and numbness.  Psychiatric/Behavioral: Negative for behavioral problems.      Allergies  Other  Home Medications   Prior to Admission medications   Medication Sig Start Date End Date Taking? Authorizing Provider  albuterol (PROVENTIL) (2.5 MG/3ML) 0.083% nebulizer solution Take 2.5 mg by nebulization every 6 (six) hours as needed for wheezing or shortness of breath.   Yes Historical Provider, MD  amLODipine (NORVASC) 5 MG tablet Take 1 tablet (5 mg total) by mouth daily. 04/24/15  Yes Arnoldo Lenis, MD  ANORO ELLIPTA 62.5-25 MCG/INH AEPB Inhale 1 puff into the lungs daily. 05/27/14  Yes Historical Provider, MD  atorvastatin (LIPITOR) 20 MG tablet Take 20 mg by mouth every evening.    Yes Historical Provider, MD  cycloSPORINE (RESTASIS) 0.05 % ophthalmic emulsion Place 1 drop into both eyes 2 (two) times daily.    Yes Historical Provider, MD  furosemide (LASIX) 40 MG tablet Take 1 tablet (40 mg total)  by mouth daily. 08/20/14  Yes Imogene Burn, PA-C  ibuprofen (ADVIL,MOTRIN) 800 MG tablet Take 1 tablet by mouth 2 (two) times daily as needed (BACK PAIN).  08/01/14  Yes Historical Provider, MD  isosorbide mononitrate (IMDUR) 30 MG 24 hr tablet TAKE 1 TABLET (30 MG TOTAL) BY MOUTH DAILY. 02/03/15  Yes Lendon Colonel, NP  loratadine (CLARITIN) 10 MG tablet TAKE ONE TABLET BY MOUTH DAILY FOR ITCHING 04/17/15  Yes Historical Provider, MD  losartan  (COZAAR) 100 MG tablet TAKE ONE TABLET BY MOUTH DAILY FOR BLOOD PRESSURE 03/11/15  Yes Historical Provider, MD  meloxicam (MOBIC) 7.5 MG tablet Take 7.5 mg by mouth 2 (two) times daily. 03/10/15  Yes Historical Provider, MD  potassium chloride SA (KLOR-CON M20) 20 MEQ tablet Take 1 tablet (20 mEq total) by mouth daily. 03/09/15  Yes Lendon Colonel, NP  PROAIR RESPICLICK 123XX123 (90 BASE) MCG/ACT AEPB USE 1-2 PUFFS INTO LUNGS 4 TIMES A DAY AS NEEDED FOR SHORTNESS OF BREATH 12/18/14  Yes Historical Provider, MD  spironolactone (ALDACTONE) 25 MG tablet Take 0.5 tablets (12.5 mg total) by mouth daily. Patient taking differently: Take 25 mg by mouth daily.  10/17/14  Yes Lendon Colonel, NP  HYDROcodone-acetaminophen (NORCO/VICODIN) 5-325 MG per tablet Take 1 tablet by mouth every 4 (four) hours as needed for moderate pain. Patient not taking: Reported on 04/26/2015 01/05/15   Samuella Cota, MD  lisinopril (PRINIVIL,ZESTRIL) 40 MG tablet TAKE ONE TABLET BY MOUTH EVERYDAY 04/07/15   Historical Provider, MD   BP 141/79 mmHg  Pulse 78  Temp(Src) 98.3 F (36.8 C) (Oral)  Resp 18  Ht 5' 6.5" (1.689 m)  Wt 211 lb (95.709 kg)  BMI 33.55 kg/m2  SpO2 100% Physical Exam  Constitutional: She appears well-developed.  HENT:  Head: Atraumatic.  Eyes: EOM are normal.  Neck: Neck supple. No JVD present.  Cardiovascular: Normal rate.   Pulmonary/Chest: Effort normal. She has no wheezes.  Abdominal: Soft.  Musculoskeletal: Normal range of motion. She exhibits edema.  Mild bilateral lower extremity pitting edema.  Neurological: She is alert.  Skin: Skin is warm.    ED Course  Procedures (including critical care time) Labs Review Labs Reviewed  BASIC METABOLIC PANEL - Abnormal; Notable for the following:    Potassium 3.3 (*)    Glucose, Bld 122 (*)    All other components within normal limits  CBC WITH DIFFERENTIAL/PLATELET - Abnormal; Notable for the following:    WBC 3.6 (*)    Neutro Abs 1.6  (*)    All other components within normal limits    Imaging Review Dg Chest 2 View  04/26/2015  CLINICAL DATA:  Shortness of breath.  Syncope and headache.  Cough. EXAM: CHEST  2 VIEW COMPARISON:  03/21/2015 FINDINGS: Residual linear opacities at the left lung base, however improved from prior. No new airspace consolidation. The heart size and mediastinal contours are unchanged. There is no pulmonary edema, pleural effusion or pneumothorax. No acute osseous abnormalities are seen. IMPRESSION: Improved left basilar aeration with minimal residual atelectasis or scarring. No new abnormalities seen. Electronically Signed   By: Jeb Levering M.D.   On: 04/26/2015 23:33   Ct Head Wo Contrast  04/26/2015  CLINICAL DATA:  61 year old female with syncope and headache. EXAM: CT HEAD WITHOUT CONTRAST TECHNIQUE: Contiguous axial images were obtained from the base of the skull through the vertex without intravenous contrast. COMPARISON:  Head CT dated 11/15/2013 FINDINGS: The ventricles and the sulci are  appropriate in size for the patient's age. There is no intracranial hemorrhage. No midline shift or mass effect identified. The gray-white matter differentiation is preserved. The visualized paranasal sinuses and mastoid air cells are well aerated. The calvarium is intact. IMPRESSION: No acute intracranial pathology. Electronically Signed   By: Anner Crete M.D.   On: 04/26/2015 23:31   I have personally reviewed and evaluated these images and lab results as part of my medical decision-making.   EKG Interpretation   Date/Time:  Sunday April 26 2015 22:14:52 EST Ventricular Rate:  74 PR Interval:  157 QRS Duration: 83 QT Interval:  424 QTC Calculation: 470 R Axis:   10 Text Interpretation:  Sinus rhythm Abnormal R-wave progression, early  transition Borderline T abnormalities, anterior leads Confirmed by  Alvino Chapel  MD, Ovid Curd [phone]) on 04/26/2015 10:20:20 PM      MDM   Final diagnoses:   Syncope, unspecified syncope type    Patient with syncope. May be vagal since she had nausea and headache. EKG reassuring. Enzymes negative. Not orthostatic. Has had previous negative heart catheterization for CAD. Will discharge home.    Davonna Belling, MD 04/26/15 2340

## 2015-04-26 NOTE — ED Notes (Signed)
Pt states she was feeling sob and felt like she needed a breathing treatment and as she got up to go to another room, she got a headache and states she "passed out." Pt unsure as to whether she hit her head. This happened around 8:50pm to 9pm

## 2015-04-27 ENCOUNTER — Telehealth: Payer: Self-pay | Admitting: Cardiology

## 2015-04-27 NOTE — Telephone Encounter (Signed)
Pt passed out and her BP was high, she was seen in the ER last night and would like to speak w/ someone about how high her BP is getting

## 2015-04-27 NOTE — Telephone Encounter (Signed)
Made a follow up ED  Appointment for 1/10 @ 9 with Dr. Harl Bowie

## 2015-04-28 ENCOUNTER — Encounter: Payer: Self-pay | Admitting: Cardiology

## 2015-04-28 ENCOUNTER — Ambulatory Visit (INDEPENDENT_AMBULATORY_CARE_PROVIDER_SITE_OTHER): Payer: Medicaid Other | Admitting: Cardiology

## 2015-04-28 ENCOUNTER — Encounter (HOSPITAL_COMMUNITY): Payer: Medicaid Other

## 2015-04-28 VITALS — BP 146/88 | HR 66 | Ht 66.0 in | Wt 213.0 lb

## 2015-04-28 DIAGNOSIS — R55 Syncope and collapse: Secondary | ICD-10-CM

## 2015-04-28 NOTE — Progress Notes (Signed)
Patient ID: Hannah Cooper, female   DOB: [birth year], 60 y.o.   MRN: [id number]     Clinical Summary [name] is a 60 y.o.female seen today for follow up of the following medical problems. This is an add on visit for recent symptoms of syncope.   1. Syncope - episode occurred a few nights ago. Felt "not right" at the table, including feeling hot and some nausea. Went to her bedroom to lay down. Was feeling SOB, felt lightheaded and nauseous. Right arm pain. Stood up to take a breathing treatment, felt very lightheaded and fell to floor. LOC for unclear time. Husband found her down, she was mumbling and responsive but lethargic. No bowel or bladder activity. - she did increase her norvasc to 5mg  daily last visit. She reports normal oral intake. Home bp's recently have been in 160s-170s. Seen in ER with normal workup including EKG and negative orthostatics.  - notes some palpitations at times. Occurs about twice a week, lasts a few minutes. Ongoing for last year. Can have shortness of breath. Can have episodes of diaphoresis at times, SOB.   Past Medical History  Diagnosis Date  . Meningitis   . Hypertension   . Hyperlipidemia   . CHF (congestive heart failure) ([place])   . Arthritis   . COPD (chronic obstructive pulmonary disease) ([facility])      Allergies  Allergen Reactions  . Other Swelling    Avon lipstick     Current Outpatient Prescriptions  Medication Sig Dispense Refill  . albuterol (PROVENTIL) (2.5 MG/3ML) 0.083% nebulizer solution Take 2.5 mg by nebulization every 6 (six) hours as needed for wheezing or shortness of breath.    Marland Kitchen amLODipine (NORVASC) 5 MG tablet Take 1 tablet (5 mg total) by mouth daily. 90 tablet 3  . ANORO ELLIPTA 62.5-25 MCG/INH AEPB Inhale 1 puff into the lungs daily.  0  . atorvastatin (LIPITOR) 20 MG tablet Take 20 mg by mouth every evening.     . cycloSPORINE (RESTASIS) 0.05 % ophthalmic emulsion Place 1 drop into Cooper eyes 2 (two) times daily.       . furosemide (LASIX) 40 MG tablet Take 1 tablet (40 mg total) by mouth daily. 90 tablet 3  . HYDROcodone-acetaminophen (NORCO/VICODIN) 5-325 MG per tablet Take 1 tablet by mouth every 4 (four) hours as needed for moderate pain. (Patient not taking: Reported on 04/26/2015) 30 tablet 0  . ibuprofen (ADVIL,MOTRIN) 800 MG tablet Take 1 tablet by mouth 2 (two) times daily as needed (BACK PAIN).   2  . isosorbide mononitrate (IMDUR) 30 MG 24 hr tablet TAKE 1 TABLET (30 MG TOTAL) BY MOUTH DAILY. 90 tablet 2  . lisinopril (PRINIVIL,ZESTRIL) 40 MG tablet TAKE ONE TABLET BY MOUTH EVERYDAY  3  . loratadine (CLARITIN) 10 MG tablet TAKE ONE TABLET BY MOUTH DAILY FOR ITCHING  1  . losartan (COZAAR) 100 MG tablet TAKE ONE TABLET BY MOUTH DAILY FOR BLOOD PRESSURE  0  . meloxicam (MOBIC) 7.5 MG tablet Take 7.5 mg by mouth 2 (two) times daily.  0  . potassium chloride SA (KLOR-CON M20) 20 MEQ tablet Take 1 tablet (20 mEq total) by mouth daily. 90 tablet 3  . PROAIR RESPICLICK 123XX123 (90 BASE) MCG/ACT AEPB USE 1-2 PUFFS INTO LUNGS 4 TIMES A DAY AS NEEDED FOR SHORTNESS OF BREATH  0  . spironolactone (ALDACTONE) 25 MG tablet Take 0.5 tablets (12.5 mg total) by mouth daily. (Patient taking differently: Take 25 mg by mouth daily. )  30 tablet 11   No current facility-administered medications for this visit.     Past Surgical History  Procedure Laterality Date  . Abdominal hysterectomy    . Thyroid surgery  2002  . Left heart catheterization with coronary angiogram N/A 09/05/2012    Procedure: LEFT HEART CATHETERIZATION WITH CORONARY ANGIOGRAM;  Surgeon: Wellington Hampshire, MD;  Location: [facility] CATH LAB;  Service: Cardiovascular;  Laterality: N/A;     Allergies  Allergen Reactions  . Other Swelling    Avon lipstick      Family History  Problem Relation Age of Onset  . Heart attack Mother 90  . Heart attack Sister 52     Social History [name] reports that she has never smoked. She has never used  smokeless tobacco. [name] reports that she does not drink alcohol.   Review of Systems CONSTITUTIONAL: No weight loss, fever, chills, weakness or fatigue.  HEENT: Eyes: No visual loss, blurred vision, double vision or yellow sclerae.No hearing loss, sneezing, congestion, runny nose or sore throat.  SKIN: No rash or itching.  CARDIOVASCULAR: per HPI RESPIRATORY: per HPI GASTROINTESTINAL: No anorexia, nausea, vomiting or diarrhea. No abdominal pain or blood.  GENITOURINARY: No burning on urination, no polyuria NEUROLOGICAL: per HPI  MUSCULOSKELETAL: No muscle, back pain, joint pain or stiffness.  LYMPHATICS: No enlarged nodes. No history of splenectomy.  PSYCHIATRIC: No history of depression or anxiety.  ENDOCRINOLOGIC: No reports of sweating, cold or heat intolerance. No polyuria or polydipsia.  Marland Kitchen   Physical Examination Filed Vitals:   04/28/15 0903  BP: 146/88  Pulse: 66   Filed Vitals:   04/28/15 0903  Height: 5\' 6"  (1.676 m)  Weight: 213 lb (96.616 kg)    Gen: resting comfortably, no acute distress HEENT: no scleral icterus, pupils equal round and reactive, no palptable cervical adenopathy,  CV: RRR, no m/r/g, no jvd Resp: Clear to auscultation bilaterally GI: abdomen is soft, non-tender, non-distended, normal bowel sounds, no hepatosplenomegaly MSK: extremities are warm, no edema.  Skin: warm, no rash Neuro:  no focal deficits Psych: appropriate affect   Diagnostic Studies 08/2012 cath Hemodynamics: AO: 164/82 mmHg LV: 169/14 mmHg LVEDP: 24 mmHg  Coronary angiography: Coronary dominance: Right   Left Main: Normal  Left Anterior Descending (LAD): Normal in size with no significant disease.  1st diagonal (D1): Small in size with minor irregularities.  2nd diagonal (D2): Normal in size with no significant disease.  3rd diagonal (D3): Normal in size with no significant disease.  Circumflex (LCx): Normal in size and nondominant. The  vessel has no significant disease.  1st obtuse marginal: Small in size with no significant disease.  2nd obtuse marginal: Small in size with no significant disease.  3rd obtuse marginal: Large in size with no significant disease.  Right Coronary Artery: Normal in size and dominant. The vessel has no significant disease.  posterior descending artery: Normal in size with no significant disease.  posterior lateral branchs: Normal in size with no significant disease.  Left ventriculography: Left ventricular systolic function is normal , LVEF is estimated at 60-65% %, there is no significant mitral regurgitation   Final Conclusions:  1. Normal coronary arteries. 2. Normal LV systolic function. 3. Moderately elevated left ventricular end-diastolic pressure likely due to diastolic heart failure.   06/2013 echo Study Conclusions  - Procedure narrative: Transthoracic echocardiography. Image quality was suboptimal. The study was technically difficult, as a result of poor sound wave transmission and restricted patient mobility. - Left ventricle:  The cavity size was normal. Wall thickness was increased in a pattern of mild to moderate LVH. Diastolic dysfunction noted, grade indeterminant. Systolic function was normal. The estimated ejection fraction was in the range of 55% to 60%. Wall motion was normal; there were no regional wall motion abnormalities.     Assessment and Plan   1. Syncope - history would suggest possible vasovagal vs orthostatic syncope, however her orthostatics were negative in ER and negative in clinic today. Her home bp's have been elevated recently. He does have palpitations at times and did associated with the episode. We will obtain a 21 day monitor to evaluate for symptomatic arrhythmias      Arnoldo Lenis, M.D.

## 2015-04-28 NOTE — Patient Instructions (Signed)
Medication Instructions:  Your physician recommends that you continue on your current medications as directed. Please refer to the Current Medication list given to you today.   Labwork: none  Testing/Procedures: Your physician has recommended that you wear an event monitor. Event monitors are medical devices that record the heart's electrical activity. Doctors most often [place] these monitors to diagnose arrhythmias. Arrhythmias are problems with the speed or rhythm of the heartbeat. The monitor is a small, portable device. You can wear one while you do your normal daily activities. This is usually used to diagnose what is causing palpitations/syncope (passing out).  *21 days for  Unspecified syncope  Follow-Up: Your physician recommends that you schedule a follow-up appointment in: 1 month with Dr. Harl Bowie   Any Other Special Instructions Will Be Listed Below (If Applicable).     If you need a refill on your cardiac medications before your next appointment, please call your pharmacy. Thanks for choosing [facility]!!!

## 2015-04-29 ENCOUNTER — Ambulatory Visit (INDEPENDENT_AMBULATORY_CARE_PROVIDER_SITE_OTHER): Payer: Medicaid Other

## 2015-04-29 DIAGNOSIS — R55 Syncope and collapse: Secondary | ICD-10-CM

## 2015-04-30 ENCOUNTER — Encounter (HOSPITAL_COMMUNITY): Payer: Medicaid Other

## 2015-05-05 ENCOUNTER — Encounter (HOSPITAL_COMMUNITY): Payer: Medicaid Other

## 2015-05-07 ENCOUNTER — Encounter (HOSPITAL_COMMUNITY): Payer: Medicaid Other

## 2015-05-12 ENCOUNTER — Encounter (HOSPITAL_COMMUNITY): Payer: Medicaid Other

## 2015-05-14 ENCOUNTER — Encounter (HOSPITAL_COMMUNITY): Payer: Medicaid Other

## 2015-05-19 ENCOUNTER — Encounter (HOSPITAL_COMMUNITY): Payer: Medicaid Other

## 2015-05-21 ENCOUNTER — Encounter (HOSPITAL_COMMUNITY): Payer: Medicaid Other

## 2015-05-26 ENCOUNTER — Encounter (HOSPITAL_COMMUNITY): Payer: Medicaid Other

## 2015-05-28 ENCOUNTER — Encounter (HOSPITAL_COMMUNITY): Payer: Medicaid Other

## 2015-05-29 ENCOUNTER — Telehealth: Payer: Self-pay | Admitting: Cardiology

## 2015-05-29 ENCOUNTER — Ambulatory Visit: Payer: Medicaid Other | Admitting: Cardiology

## 2015-05-29 NOTE — Telephone Encounter (Signed)
Yes, she can restart. Please make cardiac rehab aware  Zandra Abts MD

## 2015-05-29 NOTE — Telephone Encounter (Signed)
Cardiac rehab [facility] aware,pt aware

## 2015-05-29 NOTE — Telephone Encounter (Signed)
Patient wants to know if it is ok if she starts back to Cardiac Rehab. / tg

## 2015-06-02 ENCOUNTER — Encounter (HOSPITAL_COMMUNITY): Payer: Medicaid Other

## 2015-06-04 ENCOUNTER — Encounter (HOSPITAL_COMMUNITY): Payer: Medicaid Other

## 2015-06-09 ENCOUNTER — Encounter (HOSPITAL_COMMUNITY): Payer: Medicaid Other

## 2015-06-11 ENCOUNTER — Encounter (HOSPITAL_COMMUNITY): Payer: Medicaid Other

## 2015-06-16 ENCOUNTER — Encounter (HOSPITAL_COMMUNITY): Payer: Medicaid Other

## 2015-06-17 ENCOUNTER — Encounter: Payer: Self-pay | Admitting: Cardiology

## 2015-06-17 ENCOUNTER — Ambulatory Visit (INDEPENDENT_AMBULATORY_CARE_PROVIDER_SITE_OTHER): Payer: Medicaid Other | Admitting: Cardiology

## 2015-06-17 VITALS — BP 152/92 | HR 76 | Ht 66.0 in | Wt 211.0 lb

## 2015-06-17 DIAGNOSIS — R55 Syncope and collapse: Secondary | ICD-10-CM | POA: Diagnosis not present

## 2015-06-17 MED ORDER — AMLODIPINE BESYLATE 10 MG PO TABS: 10.0000 mg | ORAL_TABLET | Freq: Every day | ORAL | Status: DC

## 2015-06-17 NOTE — Progress Notes (Signed)
Patient ID: Hannah Cooper, female   DOB: [birth year], 60 y.o.   MRN: [id number]     Clinical Summary [name] is a 60 y.o.female seen today for follow up of the following medical problems. This is a focused visit on recent episode of syncope.   1. Syncope - episode occurred a few nights ago. Felt "not right" at the table, including feeling hot and some nausea. Went to her bedroom to lay down. Was feeling SOB, felt lightheaded and nauseous. Right arm pain. Stood up to take a breathing treatment, felt very lightheaded and fell to floor. LOC for unclear time. Husband found her down, she was mumbling and responsive but lethargic. No bowel or bladder activity. - she did increase her norvasc to 5mg  daily last visit. She reports normal oral intake. Home bp's recently have been in 160s-170s. Seen in ER with normal workup including EKG and negative orthostatics.  - notes some palpitations at times. Occurs about twice a week, lasts a few minutes. Ongoing for last year. Can have shortness of breath. Can have episodes of diaphoresis at times, SOB.   - since last visit completed event monitor with no arrhythmias.   - reports one isolated episode since that time while watching tv. Started feeling funny. Had heaviness in chest, felt lightheaded. Had some SOB at that time. Mild cough at that time. Checked bp and it was 130s/90s.       Past Medical History  Diagnosis Date  . Meningitis   . Hypertension   . Hyperlipidemia   . CHF (congestive heart failure) ([place])   . Arthritis   . COPD (chronic obstructive pulmonary disease) ([facility])      Allergies  Allergen Reactions  . Other Swelling    Avon lipstick     Current Outpatient Prescriptions  Medication Sig Dispense Refill  . albuterol (PROVENTIL) (2.5 MG/3ML) 0.083% nebulizer solution Take 2.5 mg by nebulization every 6 (six) hours as needed for wheezing or shortness of breath.    Marland Kitchen amLODipine (NORVASC) 5 MG tablet Take 1 tablet (5 mg  total) by mouth daily. 90 tablet 3  . ANORO ELLIPTA 62.5-25 MCG/INH AEPB Inhale 1 puff into the lungs daily.  0  . atorvastatin (LIPITOR) 20 MG tablet Take 20 mg by mouth every evening.     . cycloSPORINE (RESTASIS) 0.05 % ophthalmic emulsion Place 1 drop into Cooper eyes 2 (two) times daily.     . furosemide (LASIX) 40 MG tablet Take 1 tablet (40 mg total) by mouth daily. 90 tablet 3  . HYDROcodone-acetaminophen (NORCO/VICODIN) 5-325 MG per tablet Take 1 tablet by mouth every 4 (four) hours as needed for moderate pain. 30 tablet 0  . ibuprofen (ADVIL,MOTRIN) 800 MG tablet Take 1 tablet by mouth 2 (two) times daily as needed (BACK PAIN).   2  . isosorbide mononitrate (IMDUR) 30 MG 24 hr tablet TAKE 1 TABLET (30 MG TOTAL) BY MOUTH DAILY. 90 tablet 2  . lisinopril (PRINIVIL,ZESTRIL) 40 MG tablet TAKE ONE TABLET BY MOUTH EVERYDAY  3  . loratadine (CLARITIN) 10 MG tablet TAKE ONE TABLET BY MOUTH DAILY FOR ITCHING  1  . losartan (COZAAR) 100 MG tablet TAKE ONE TABLET BY MOUTH DAILY FOR BLOOD PRESSURE  0  . meloxicam (MOBIC) 7.5 MG tablet Take 7.5 mg by mouth 2 (two) times daily.  0  . potassium chloride SA (KLOR-CON M20) 20 MEQ tablet Take 1 tablet (20 mEq total) by mouth daily. 90 tablet 3  . Sully [id number] (  90 BASE) MCG/ACT AEPB USE 1-2 PUFFS INTO LUNGS 4 TIMES A DAY AS NEEDED FOR SHORTNESS OF BREATH  0  . spironolactone (ALDACTONE) 25 MG tablet Take 0.5 tablets (12.5 mg total) by mouth daily. (Patient taking differently: Take 25 mg by mouth daily. ) 30 tablet 11   No current facility-administered medications for this visit.     Past Surgical History  Procedure Laterality Date  . Abdominal hysterectomy    . Thyroid surgery  2002  . Left heart catheterization with coronary angiogram N/A 09/05/2012    Procedure: LEFT HEART CATHETERIZATION WITH CORONARY ANGIOGRAM;  Surgeon: Wellington Hampshire, MD;  Location: [facility];  Service: Cardiovascular;  Laterality: N/A;     Allergies  Allergen  Reactions  . Other Swelling    Avon lipstick      Family History  Problem Relation Age of Onset  . Heart attack Mother 41  . Heart attack Sister 14     Social History [name] reports that she has never smoked. She has never used smokeless tobacco. [name] reports that she does not drink alcohol.   Review of Systems CONSTITUTIONAL: No weight loss, fever, chills, weakness or fatigue.  HEENT: Eyes: No visual loss, blurred vision, double vision or yellow sclerae.No hearing loss, sneezing, congestion, runny nose or sore throat.  SKIN: No rash or itching.  CARDIOVASCULAR: no chest pain, no palpitations.  RESPIRATORY: occas SOB GASTROINTESTINAL: No anorexia, nausea, vomiting or diarrhea. No abdominal pain or blood.  GENITOURINARY: No burning on urination, no polyuria NEUROLOGICAL: No headache, dizziness, syncope, paralysis, ataxia, numbness or tingling in the extremities. No change in bowel or bladder control.  MUSCULOSKELETAL: No muscle, back pain, joint pain or stiffness.  LYMPHATICS: No enlarged nodes. No history of splenectomy.  PSYCHIATRIC: No history of depression or anxiety.  ENDOCRINOLOGIC: No reports of sweating, cold or heat intolerance. No polyuria or polydipsia.  Marland Kitchen   Physical Examination Filed Vitals:   06/17/15 1007  BP: 152/92  Pulse: 76   Filed Vitals:   06/17/15 1007  Height: 5\' 6"  (1.676 m)  Weight: 211 lb (95.709 kg)    Gen: resting comfortably, no acute distress HEENT: no scleral icterus, pupils equal round and reactive, no palptable cervical adenopathy,  CV: RRR, no m/r/g, no jvd Resp: Clear to auscultation bilaterally GI: abdomen is soft, non-tender, non-distended, normal bowel sounds, no hepatosplenomegaly MSK: extremities are warm, no edema.  Skin: warm, no rash Neuro:  no focal deficits Psych: appropriate affect   Diagnostic Studies 08/2012 cath Hemodynamics: AO: 164/82 mmHg LV: 169/14 mmHg LVEDP: 24 mmHg  Coronary  angiography: Coronary dominance: Right   Left Main: Normal  Left Anterior Descending (LAD): Normal in size with no significant disease.  1st diagonal (D1): Small in size with minor irregularities.  2nd diagonal (D2): Normal in size with no significant disease.  3rd diagonal (D3): Normal in size with no significant disease.  Circumflex (LCx): Normal in size and nondominant. The vessel has no significant disease.  1st obtuse marginal: Small in size with no significant disease.  2nd obtuse marginal: Small in size with no significant disease.  3rd obtuse marginal: Large in size with no significant disease.  Right Coronary Artery: Normal in size and dominant. The vessel has no significant disease.  posterior descending artery: Normal in size with no significant disease.  posterior lateral branchs: Normal in size with no significant disease.  Left ventriculography: Left ventricular systolic function is normal , LVEF is estimated at 60-65% %, there  is no significant mitral regurgitation   Final Conclusions:  1. Normal coronary arteries. 2. Normal LV systolic function. 3. Moderately elevated left ventricular end-diastolic pressure likely due to diastolic heart failure.   06/2013 echo Study Conclusions  - Procedure narrative: Transthoracic echocardiography. Image quality was suboptimal. The study was technically difficult, as a result of poor sound wave transmission and restricted patient mobility. - Left ventricle: The cavity size was normal. Wall thickness was increased in a pattern of mild to moderate LVH. Diastolic dysfunction noted, grade indeterminant. Systolic function was normal. The estimated ejection fraction was in the range of 55% to 60%. Wall motion was normal; there were no regional wall motion abnormalities.   Jan 2017 Event monitor: no arrhythmias  06/2014 CPX Conclusion: Exercise testing with gas exchange demonstrates  a mild-moderately reduced functional capacity when compared to matched sedentary norms. There was an in-adequate HR response to the exercise response and flat O2 pulse. This high HR reserve was likely high due to pulmonary limitations based on restrictive spirometry and RR 45 at peak exercise and reaching maximum ventilatory limits. However, it could have easily been due to poor effort. The sub-maximal effort is clouding interpretation of the test. Nevertheless, correlation of resting spirometry test Showing restriction with outpatient pulmonary function test is warranted   Assessment and Plan   1. Syncope - history would suggest possible vasovagal vs orthostatic syncope. Event monitor without significant arrhythmias, recent echo without significant pathology - no further cardiac workup at this time      Arnoldo Lenis, M.D.

## 2015-06-17 NOTE — Patient Instructions (Signed)
Medication Instructions:  Increase norvasc to 10 mg daily   Labwork: none  Testing/Procedures: none  Follow-Up: Your physician wants you to follow-up in: 6 months.  You will receive a reminder letter in the mail two months in advance. If you don't receive a letter, please call our office to schedule the follow-up appointment.   Any Other Special Instructions Will Be Listed Below (If Applicable).     If you need a refill on your cardiac medications before your next appointment, please call your pharmacy.

## 2015-06-18 ENCOUNTER — Encounter (HOSPITAL_COMMUNITY): Payer: Medicaid Other

## 2015-06-23 ENCOUNTER — Encounter (HOSPITAL_COMMUNITY)
Admission: RE | Admit: 2015-06-23 | Discharge: 2015-06-23 | Disposition: A | Discharge status: Home / Self Care | Payer: Medicaid Other | Source: Ambulatory Visit | Attending: Cardiology | Admitting: Cardiology

## 2015-06-23 ENCOUNTER — Encounter (HOSPITAL_COMMUNITY): Payer: Medicaid Other

## 2015-06-23 DIAGNOSIS — I5022 Chronic systolic (congestive) heart failure: Secondary | ICD-10-CM | POA: Insufficient documentation

## 2015-06-25 ENCOUNTER — Encounter (HOSPITAL_COMMUNITY)
Admission: RE | Admit: 2015-06-25 | Discharge: 2015-06-25 | Disposition: A | Discharge status: Home / Self Care | Payer: Medicaid Other | Source: Ambulatory Visit | Attending: Cardiology | Admitting: Cardiology

## 2015-06-25 ENCOUNTER — Encounter (HOSPITAL_COMMUNITY): Payer: Medicaid Other

## 2015-06-25 DIAGNOSIS — I5022 Chronic systolic (congestive) heart failure: Secondary | ICD-10-CM | POA: Diagnosis not present

## 2015-06-30 ENCOUNTER — Encounter (HOSPITAL_COMMUNITY): Payer: Medicaid Other

## 2015-07-02 ENCOUNTER — Encounter (HOSPITAL_COMMUNITY)
Admission: RE | Admit: 2015-07-02 | Discharge: 2015-07-02 | Disposition: A | Discharge status: Home / Self Care | Payer: Medicaid Other | Source: Ambulatory Visit | Attending: Cardiology | Admitting: Cardiology

## 2015-07-02 ENCOUNTER — Encounter (HOSPITAL_COMMUNITY): Payer: Medicaid Other

## 2015-07-02 DIAGNOSIS — I5022 Chronic systolic (congestive) heart failure: Secondary | ICD-10-CM | POA: Diagnosis not present

## 2015-07-07 ENCOUNTER — Encounter (HOSPITAL_COMMUNITY): Payer: Medicaid Other

## 2015-07-07 ENCOUNTER — Encounter (HOSPITAL_COMMUNITY)
Admission: RE | Admit: 2015-07-07 | Discharge: 2015-07-07 | Disposition: A | Discharge status: Home / Self Care | Payer: Medicaid Other | Source: Ambulatory Visit | Attending: Cardiology | Admitting: Cardiology

## 2015-07-07 DIAGNOSIS — I5022 Chronic systolic (congestive) heart failure: Secondary | ICD-10-CM | POA: Diagnosis not present

## 2015-07-09 ENCOUNTER — Encounter (HOSPITAL_COMMUNITY)
Admission: RE | Admit: 2015-07-09 | Discharge: 2015-07-09 | Disposition: A | Discharge status: Home / Self Care | Payer: Medicaid Other | Source: Ambulatory Visit | Attending: Cardiology | Admitting: Cardiology

## 2015-07-09 ENCOUNTER — Encounter (HOSPITAL_COMMUNITY): Payer: Medicaid Other

## 2015-07-09 DIAGNOSIS — I5022 Chronic systolic (congestive) heart failure: Secondary | ICD-10-CM | POA: Diagnosis not present

## 2015-07-14 ENCOUNTER — Encounter (HOSPITAL_COMMUNITY): Payer: Medicaid Other

## 2015-07-14 ENCOUNTER — Encounter (HOSPITAL_COMMUNITY)
Admission: RE | Admit: 2015-07-14 | Discharge: 2015-07-14 | Disposition: A | Discharge status: Home / Self Care | Payer: Medicaid Other | Source: Ambulatory Visit | Attending: Cardiology | Admitting: Cardiology

## 2015-07-14 DIAGNOSIS — I5022 Chronic systolic (congestive) heart failure: Secondary | ICD-10-CM | POA: Diagnosis not present

## 2015-07-14 NOTE — Progress Notes (Signed)
Patient was given individual home exercise plan. Handout was reviewed and discussed with patient. Patient's long term goals were reviewed and reassessed. Patient signed home exercise plan and expressed understanding.   

## 2015-07-16 ENCOUNTER — Encounter (HOSPITAL_COMMUNITY)
Admission: RE | Admit: 2015-07-16 | Discharge: 2015-07-16 | Disposition: A | Discharge status: Home / Self Care | Payer: Medicaid Other | Source: Ambulatory Visit | Attending: Cardiology | Admitting: Cardiology

## 2015-07-16 ENCOUNTER — Encounter (HOSPITAL_COMMUNITY): Payer: Medicaid Other

## 2015-07-16 DIAGNOSIS — I5022 Chronic systolic (congestive) heart failure: Secondary | ICD-10-CM | POA: Diagnosis not present

## 2015-07-20 ENCOUNTER — Other Ambulatory Visit (HOSPITAL_COMMUNITY): Payer: Self-pay | Admitting: Family Medicine

## 2015-07-20 DIAGNOSIS — Z853 Personal history of malignant neoplasm of breast: Secondary | ICD-10-CM

## 2015-07-21 ENCOUNTER — Encounter (HOSPITAL_COMMUNITY): Payer: Medicaid Other

## 2015-07-21 ENCOUNTER — Other Ambulatory Visit (HOSPITAL_COMMUNITY): Payer: Self-pay | Admitting: Family Medicine

## 2015-07-21 ENCOUNTER — Ambulatory Visit (HOSPITAL_COMMUNITY)
Admission: RE | Admit: 2015-07-21 | Discharge: 2015-07-21 | Disposition: A | Discharge status: Home / Self Care | Payer: Medicaid Other | Source: Ambulatory Visit | Attending: Family Medicine | Admitting: Family Medicine

## 2015-07-21 DIAGNOSIS — M25572 Pain in left ankle and joints of left foot: Secondary | ICD-10-CM

## 2015-07-22 ENCOUNTER — Other Ambulatory Visit (HOSPITAL_COMMUNITY): Payer: Self-pay | Admitting: Family Medicine

## 2015-07-22 DIAGNOSIS — R229 Localized swelling, mass and lump, unspecified: Principal | ICD-10-CM

## 2015-07-22 DIAGNOSIS — IMO0002 Reserved for concepts with insufficient information to code with codable children: Secondary | ICD-10-CM

## 2015-07-23 ENCOUNTER — Encounter (HOSPITAL_COMMUNITY): Payer: Medicaid Other

## 2015-07-28 ENCOUNTER — Encounter (HOSPITAL_COMMUNITY): Payer: Medicaid Other

## 2015-07-28 ENCOUNTER — Encounter (HOSPITAL_COMMUNITY)
Admission: RE | Admit: 2015-07-28 | Discharge: 2015-07-28 | Disposition: A | Discharge status: Home / Self Care | Payer: Medicaid Other | Source: Ambulatory Visit | Attending: Cardiology | Admitting: Cardiology

## 2015-07-28 DIAGNOSIS — I5022 Chronic systolic (congestive) heart failure: Secondary | ICD-10-CM | POA: Insufficient documentation

## 2015-07-29 ENCOUNTER — Other Ambulatory Visit (HOSPITAL_COMMUNITY): Payer: Self-pay | Admitting: Family Medicine

## 2015-07-29 DIAGNOSIS — IMO0002 Reserved for concepts with insufficient information to code with codable children: Secondary | ICD-10-CM

## 2015-07-29 DIAGNOSIS — R229 Localized swelling, mass and lump, unspecified: Principal | ICD-10-CM

## 2015-07-30 ENCOUNTER — Encounter (HOSPITAL_COMMUNITY): Payer: Medicaid Other

## 2015-08-04 ENCOUNTER — Ambulatory Visit (HOSPITAL_COMMUNITY)
Admission: RE | Admit: 2015-08-04 | Discharge: 2015-08-04 | Disposition: A | Discharge status: Home / Self Care | Payer: Medicaid Other | Source: Ambulatory Visit | Attending: Family Medicine | Admitting: Family Medicine

## 2015-08-04 ENCOUNTER — Encounter (HOSPITAL_COMMUNITY): Payer: Medicaid Other

## 2015-08-04 DIAGNOSIS — R229 Localized swelling, mass and lump, unspecified: Secondary | ICD-10-CM | POA: Diagnosis not present

## 2015-08-04 DIAGNOSIS — IMO0002 Reserved for concepts with insufficient information to code with codable children: Secondary | ICD-10-CM

## 2015-08-06 ENCOUNTER — Encounter (HOSPITAL_COMMUNITY): Payer: Medicaid Other

## 2015-08-11 ENCOUNTER — Encounter (HOSPITAL_COMMUNITY)
Admission: RE | Admit: 2015-08-11 | Discharge: 2015-08-11 | Disposition: A | Discharge status: Home / Self Care | Payer: Medicaid Other | Source: Ambulatory Visit | Attending: Cardiology | Admitting: Cardiology

## 2015-08-11 DIAGNOSIS — I5022 Chronic systolic (congestive) heart failure: Secondary | ICD-10-CM | POA: Diagnosis not present

## 2015-08-13 ENCOUNTER — Encounter (HOSPITAL_COMMUNITY)
Admission: RE | Admit: 2015-08-13 | Discharge: 2015-08-13 | Disposition: A | Discharge status: Home / Self Care | Payer: Medicaid Other | Source: Ambulatory Visit | Attending: Cardiology | Admitting: Cardiology

## 2015-08-13 DIAGNOSIS — I5022 Chronic systolic (congestive) heart failure: Secondary | ICD-10-CM | POA: Diagnosis not present

## 2015-08-18 ENCOUNTER — Encounter (HOSPITAL_COMMUNITY): Payer: Medicaid Other

## 2015-08-18 NOTE — Progress Notes (Signed)
Pulmonary Rehabilitation Program Outcomes Report   Orientation:  03/31/15 Graduate Date:  tbd Discharge Date:  tbd # of sessions completed: 3  Pulmonologist: Branch Family MD:  Hill Class Time:  [id number]  A.  Exercise Program:  Tolerates exercise @ 3.65 METS for 15 minutes and Walk Test Results:  Pre: 2.23 mets  B.  Mental Health:  Good mental attitude and PHQ-9: 5. patient states she does not feel she needs counseling.   C.  Education/Instruction/Skills  Knows THR for exercise  Uses Perceived Exertion Scale and/or Dyspnea Scale  D.  Nutrition/Weight Control/Body Composition:  Adherence to prescribed nutrition program: fair    E.  Blood Lipids    Lab Results  Component Value Date   CHOL 102 07/05/2013   HDL 46 07/05/2013   LDLCALC 37 07/05/2013   TRIG 97 07/05/2013   CHOLHDL 2.2 07/05/2013    F.  Lifestyle Changes:  Making positive lifestyle changes  G.  Symptoms noted with exercise:  Asymptomatic  Report Completed By:  Stevphen Rochester RN   Comments:  This is the patients first week progress note for [facility].

## 2015-08-18 NOTE — Addendum Note (Signed)
Encounter addended by: Cathie Olden, RN on: 08/18/2015  7:55 AM<BR>     Documentation filed: Clinical Notes, Notes Section

## 2015-08-20 ENCOUNTER — Encounter (HOSPITAL_COMMUNITY): Payer: Medicaid Other

## 2015-08-25 ENCOUNTER — Encounter (HOSPITAL_COMMUNITY)
Admission: RE | Admit: 2015-08-25 | Discharge: 2015-08-25 | Disposition: A | Discharge status: Home / Self Care | Payer: Medicaid Other | Source: Ambulatory Visit | Attending: Cardiology | Admitting: Cardiology

## 2015-08-25 DIAGNOSIS — I5022 Chronic systolic (congestive) heart failure: Secondary | ICD-10-CM | POA: Insufficient documentation

## 2015-08-27 ENCOUNTER — Encounter (HOSPITAL_COMMUNITY)
Admission: RE | Admit: 2015-08-27 | Discharge: 2015-08-27 | Disposition: A | Discharge status: Home / Self Care | Payer: Medicaid Other | Source: Ambulatory Visit | Attending: Cardiology | Admitting: Cardiology

## 2015-08-27 DIAGNOSIS — I5022 Chronic systolic (congestive) heart failure: Secondary | ICD-10-CM | POA: Diagnosis not present

## 2015-08-28 ENCOUNTER — Ambulatory Visit (HOSPITAL_COMMUNITY): Payer: Medicaid Other | Attending: Orthopedic Surgery

## 2015-08-28 DIAGNOSIS — M25572 Pain in left ankle and joints of left foot: Secondary | ICD-10-CM | POA: Diagnosis present

## 2015-08-28 DIAGNOSIS — R2242 Localized swelling, mass and lump, left lower limb: Secondary | ICD-10-CM | POA: Insufficient documentation

## 2015-08-28 DIAGNOSIS — R262 Difficulty in walking, not elsewhere classified: Secondary | ICD-10-CM | POA: Insufficient documentation

## 2015-08-28 NOTE — Patient Instructions (Signed)
1. Continue with exercises given from the Ortho, but perform in a pain free range.  2. Limit weight bearing activities. 3. Continue to work on swelling management with ice and/or compression as tolerated.  4. Follow-up with Orthopedist ASAP, current exam warrants additional testing/imaging.

## 2015-08-28 NOTE — Therapy (Signed)
[facility] [place], [place], [address] Phone: [phone]   Fax:  [phone]  Physical Therapy Evaluation  Patient Details  Name: Hannah Cooper MRN: [id number] Date of Birth: 1956-02-18 Referring Provider: Dorna Leitz  Encounter Date: 08/28/2015      PT End of Session - 08/28/15 0944    Visit Number 1   Number of Visits --  needs FU with ortho, not appropriate for PT at this time.    Authorization Type Medicaid   Authorization Time Period 08/28/15-10/28/15   Authorization - Visit Number 1   PT Start Time 0817   PT Stop Time 0917   PT Time Calculation (min) 60 min   Activity Tolerance Patient tolerated treatment well;Patient limited by pain   Behavior During Therapy [facility] for tasks assessed/performed      Past Medical History  Diagnosis Date  . Meningitis   . Hypertension   . Hyperlipidemia   . CHF (congestive heart failure) ([place])   . Arthritis   . COPD (chronic obstructive pulmonary disease) [facility])     Past Surgical History  Procedure Laterality Date  . Abdominal hysterectomy    . Thyroid surgery  2002  . Left heart catheterization with coronary angiogram N/A 09/05/2012    Procedure: LEFT HEART CATHETERIZATION WITH CORONARY ANGIOGRAM;  Surgeon: Wellington Hampshire, MD;  Location: [facility] CATH [facility];  Service: Cardiovascular;  Laterality: N/A;    There were no vitals filed for this visit.       Subjective Assessment - 08/28/15 0823    Subjective Pt reports 2 months ago, insidious onset of Left ankle pain upon waking and donning heels before church. Pain in central to the lateral malleolus, with swelling also. Pt does not feel like it is related to any activity with cardiac rehab.    Pertinent History Back arthritis, currently in cardiac/pulmonary rehab at [facility] although she had to take a short break due to HTN issues. (not sure if she has Ntrio); "terrified of needles"   How long can you sit comfortably? unlimited,  but tries to elevate.    How long can you stand comfortably? (cooking) 15 minutes prior to worsening throbbing/swelling.    How long can you walk comfortably? (grocery trips) ~300-541ft (parking lot to box store instrance)   Diagnostic tests X-rays done (negative)    Patient Stated Goals Find resolution in ankle defciits and return to daily activities.    Currently in Pain? Yes   Pain Score 8    Pain Location Ankle   Pain Orientation Left;Lateral   Pain Descriptors / Indicators Aching;Throbbing   Pain Onset More than a month ago   Aggravating Factors  standing, walking, NuStep at cardiac rehab    Pain Relieving Factors Rx Ibuprophen (800mg ?), Icing doesn't help with pain, nor does heat.    Effect of Pain on Daily Activities limits most daily mobility activities.    Multiple Pain Sites No            [facility] PT Assessment - 08/28/15 0001    Assessment   Medical Diagnosis Left insidious lateral ankle pain and swelling   Referring Provider Dorna Leitz   Onset Date/Surgical Date 06/28/15   Hand Dominance Right   Next MD Visit not sure   Prior Therapy some exercises from the MD, ASO brace   Balance Screen   Has the patient fallen in the past 6 months No   Has the patient had a decrease in activity level  because of a fear of falling?  No   Is the patient reluctant to leave their home because of a fear of falling?  Yes   Home Environment   Living Environment --  2 steps to enter, no railing, steps to basement for laundry   Prior Function   Level of Independence Independent;Independent with basic ADLs;Independent with household mobility with device   Circumferential Edema   Circumferential - Right @ tibial tubercle: 39cm; @10cm  distal: 40.2cm   Circumferential - Left  @ tibial tubercle: 36cm; @10cm  distal: 34cm   Sensation   Light Touch Impaired Detail   Light Touch Impaired Details Impaired LLE   Additional Comments Light touch decreased @ Left L4, L5, S1, S2 dermatomes.    Single  Leg Stance   Comments Barefoot:  R: 22s; L: 1 second, limited byy, pain and weakness   Other:   Other/ Comments 2MWT:   255ft, 0.47m/s   AROM   Right Hip Flexion 120  seated   Left Hip Flexion 105  limited grossly by weakness (seated)    Right Ankle Dorsiflexion 13   Right Ankle Plantar Flexion 46   Left Ankle Dorsiflexion 12   Left Ankle Plantar Flexion 30   Strength   Overall Strength Other (comment)  Left Hallux extention, WNL, painfree, long toe extensorsweak   Right Hip Flexion 4+/5   Left Hip Flexion 3+/5   Right Knee Flexion 5/5   Right Knee Extension 5/5   Left Knee Flexion 3/5   Left Knee Extension 3+/5   Right Ankle Dorsiflexion 5/5   Right Ankle Plantar Flexion 5/5  manually resisted   Left Ankle Dorsiflexion 2+/5  very limited by pain   Left Ankle Plantar Flexion 4/5   Special Tests    Special Tests --  Squeeze Test: Negative for syndesmotic involvement.    Anterior Drawer Test   Findings Unable to test   Comments swelling and pain limit testing ability   Talar Tilt Test    Findings Postive   Comments swelling and pain limit testing ability   Tinel's test - Post Tibialis    Findings Unable to test   Comments swelling and pain limit testing ability   Tinel's Test- deep peroneal   Findings Unable to test   Comments swelling and pain limit testing ability   Great Toe Extension Test    Comments Negative for PF pain   Dorsiflexion-Eversion Test   Findings Positive   Comments swelling and pain limit testing ability   Functional Gait  Assessment   Gait assessed  Yes  antalgic, decreased WB on Left side, with increased stance.                           PT Education - 08/28/15 [id number]    Education provided Yes   Education Details Concerns over limited improvement in localized swelling, decreased weight bearing tolerance, and pain upon palpation.    Person(s) Educated Patient   Methods Explanation   Comprehension Verbalized understanding           PT Short Term Goals - 08/28/15 1005    PT SHORT TERM GOAL #1   Title At first visit, pt will demonstrate understanding of education regarding activitty modification, exercise completeion, and self management of swelling at home.    Status Achieved                  Plan - 08/28/15 0945  Clinical Impression Statement Pt is a 60yo black female who reports awakening about 2 months ago with insidious onset Left lateral ankle pain and localized swelling that has remained largely unchanged since inittial onset.   Today at evaluation patient presents with several impairments. Pain is 8/10, and fluctuates during day based on rest, activity modiffication, and medications, but patient no longer tolerates longer than limited community distances. Swelling is prevalent throughout BLE as patient has a history of fluid retention with CHF, and is mostly restricted into the calfs, however swelling at the Left ankle has a pattern more localized to the lateral malleolus, with minimal/fluctuating expansion into the foot. Swelling in the medial ankle is minimal and residuual. The right calf feels more edematous to palpation, more turgid, however circumferential measurement at the tibial tuberosity and 10 cm distal reveals decreased girth on affected side, which I suspect is related to disuse atrophy of 2 months of self-imposed usage restrictions secondary to pain. Strength is limitied throughout the LLE, noted most clearly in hip flexion, knee flexion, and knee extention, however weakness in the ankle cannot be fully evaluated due to pain. Light touch sensation is decreased in the LLE along the dermatomes L4, L5, S1, and S2, curious for involvement of the lumbar spine and/or upper nervous system deficits previously undocumented. Joint range is limited in the Left ankle DF, PF are minimally impaired, but very painful. Special tests are largely inconclusive due to poor pain tolerance. Examination of the  foot reveals normal mobility, strength, and swelling in the midfoot, forefoot, and medial ankle.   Recommendation: Given the chronicity of this problem, the poor progress, continued localized swelling, and limited weight bearing, this patient warants additional FU with ortho for additional diagnostics and/or imaging and is not appropriate for therapy at this time.     PT Frequency One time visit   PT Next Visit Plan 1 visit only, not appropriate for PT. needs additional FU with MD.    Consulted and Agree with Plan of Care Patient      Patient will benefit from skilled therapeutic intervention in order to improve the following deficits and impairments:     Visit Diagnosis: Pain in left ankle and joints of left foot - Plan: PT plan of care cert/re-cert  Localized swelling, mass and lump, left lower limb - Plan: PT plan of care cert/re-cert  Difficulty in walking, not elsewhere classified - Plan: PT plan of care cert/re-cert     Problem List Patient Active Problem List   Diagnosis Date Noted  . COPD (chronic obstructive pulmonary disease) ([place]) 08/20/2014  . Dyspnea 12/19/2013  . Chronic diastolic CHF (congestive heart failure) ([place]) 07/05/2013  . Lower extremity edema 07/05/2013  . Hyperlipidemia   . Chest pain 07/04/2013  . Hypokalemia 09/04/2012  . Precordial pain 09/04/2012  . Viral meningitis 01/22/2011  . Vomiting 01/22/2011  . PNEUMONIA, LEFT LOWER LOBE 10/10/2006  . DISEASE, ACUTE BRONCHOSPASM 10/10/2006  . Hypothyroidism 05/23/2006  . Essential hypertension 05/23/2006  . OSTEOARTHRITIS 05/23/2006   10:10 AM, 08/28/2015 Etta Grandchild, PT, DPT PRN Physical Therapist - [place] License # [id number] [phone] (mobile)   [facility] [address] [place], [place], [address] Phone: [phone]   Fax:  [phone]  Name: Hannah Cooper MRN: [id number] Date of Birth: Dec 03, 1955

## 2015-09-01 ENCOUNTER — Encounter (HOSPITAL_COMMUNITY): Payer: Medicaid Other

## 2015-09-03 ENCOUNTER — Encounter (HOSPITAL_COMMUNITY)
Admission: RE | Admit: 2015-09-03 | Discharge: 2015-09-03 | Disposition: A | Discharge status: Home / Self Care | Payer: Medicaid Other | Source: Ambulatory Visit | Attending: Cardiology | Admitting: Cardiology

## 2015-09-03 DIAGNOSIS — I5022 Chronic systolic (congestive) heart failure: Secondary | ICD-10-CM | POA: Diagnosis not present

## 2015-09-06 ENCOUNTER — Emergency Department (HOSPITAL_COMMUNITY)
Admission: EM | Admit: 2015-09-06 | Discharge: 2015-09-06 | Disposition: A | Discharge status: Home / Self Care | Payer: Medicaid Other | Attending: Emergency Medicine | Admitting: Emergency Medicine

## 2015-09-06 ENCOUNTER — Encounter (HOSPITAL_COMMUNITY): Payer: Self-pay

## 2015-09-06 DIAGNOSIS — I509 Heart failure, unspecified: Secondary | ICD-10-CM | POA: Diagnosis not present

## 2015-09-06 DIAGNOSIS — M1990 Unspecified osteoarthritis, unspecified site: Secondary | ICD-10-CM | POA: Insufficient documentation

## 2015-09-06 DIAGNOSIS — I110 Hypertensive heart disease with heart failure: Secondary | ICD-10-CM | POA: Diagnosis not present

## 2015-09-06 DIAGNOSIS — Z79899 Other long term (current) drug therapy: Secondary | ICD-10-CM | POA: Diagnosis not present

## 2015-09-06 DIAGNOSIS — J449 Chronic obstructive pulmonary disease, unspecified: Secondary | ICD-10-CM | POA: Diagnosis not present

## 2015-09-06 DIAGNOSIS — R05 Cough: Secondary | ICD-10-CM | POA: Diagnosis present

## 2015-09-06 DIAGNOSIS — Z791 Long term (current) use of non-steroidal anti-inflammatories (NSAID): Secondary | ICD-10-CM | POA: Insufficient documentation

## 2015-09-06 DIAGNOSIS — J189 Pneumonia, unspecified organism: Secondary | ICD-10-CM | POA: Insufficient documentation

## 2015-09-06 MED ORDER — IPRATROPIUM-ALBUTEROL 0.5-2.5 (3) MG/3ML IN SOLN
3.0000 mL | Freq: Once | RESPIRATORY_TRACT | Status: AC
  Administered 2015-09-06: 3 mL via RESPIRATORY_TRACT
  Filled 2015-09-06: qty 3

## 2015-09-06 MED ORDER — METHYLPREDNISOLONE SODIUM SUCC 125 MG IJ SOLR
125.0000 mg | Freq: Once | INTRAMUSCULAR | Status: AC
  Administered 2015-09-06: 125 mg via INTRAVENOUS
  Filled 2015-09-06: qty 2

## 2015-09-06 MED ORDER — PREDNISONE 10 MG PO TABS: 20.0000 mg | ORAL_TABLET | Freq: Every day | ORAL | Status: DC

## 2015-09-06 NOTE — Discharge Instructions (Signed)
Prescription for prednisone. Use your breathing medication as needed.

## 2015-09-06 NOTE — ED Notes (Signed)
Patient states flair up up COPD after spraying perfume on herself.
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
## 2015-09-06 NOTE — ED Notes (Signed)
RT notified for neb tx. 

## 2015-09-08 ENCOUNTER — Encounter (HOSPITAL_COMMUNITY)
Admission: RE | Admit: 2015-09-08 | Discharge: 2015-09-08 | Disposition: A | Discharge status: Home / Self Care | Payer: Medicaid Other | Source: Ambulatory Visit | Attending: Cardiology | Admitting: Cardiology

## 2015-09-09 ENCOUNTER — Encounter: Payer: Self-pay | Admitting: Physician Assistant

## 2015-09-09 ENCOUNTER — Ambulatory Visit (INDEPENDENT_AMBULATORY_CARE_PROVIDER_SITE_OTHER): Payer: Medicaid Other | Admitting: Physician Assistant

## 2015-09-09 VITALS — BP 150/90 | HR 69 | Ht 66.0 in | Wt 218.0 lb

## 2015-09-09 DIAGNOSIS — I5032 Chronic diastolic (congestive) heart failure: Secondary | ICD-10-CM

## 2015-09-09 DIAGNOSIS — R079 Chest pain, unspecified: Secondary | ICD-10-CM

## 2015-09-09 DIAGNOSIS — I10 Essential (primary) hypertension: Secondary | ICD-10-CM | POA: Diagnosis not present

## 2015-09-09 MED ORDER — NITROGLYCERIN 0.4 MG SL SUBL
0.4000 mg | SUBLINGUAL_TABLET | SUBLINGUAL | Status: DC | PRN
Start: 2015-09-09 — End: 2019-06-14

## 2015-09-09 NOTE — Patient Instructions (Signed)
Your physician wants you to follow-up in: Sept. With Dr. Harl Bowie. You will receive a reminder letter in the mail two months in advance. If you don't receive a letter, please call our office to schedule the follow-up appointment.  Your physician recommends that you continue on your current medications as directed. Please refer to the Current Medication list given to you today.  Start Nitro As Needed.  If you need a refill on your cardiac medications before your next appointment, please call your pharmacy.  Thank you for choosing [facility]!
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
## 2015-09-10 ENCOUNTER — Encounter (HOSPITAL_COMMUNITY): Payer: Medicaid Other

## 2015-09-15 ENCOUNTER — Encounter (HOSPITAL_COMMUNITY): Payer: Medicaid Other

## 2015-09-17 ENCOUNTER — Encounter (HOSPITAL_COMMUNITY): Payer: Medicaid Other

## 2015-09-22 ENCOUNTER — Encounter (HOSPITAL_COMMUNITY)
Admission: RE | Admit: 2015-09-22 | Discharge: 2015-09-22 | Disposition: A | Discharge status: Home / Self Care | Payer: Medicaid Other | Source: Ambulatory Visit | Attending: Cardiology | Admitting: Cardiology

## 2015-09-22 DIAGNOSIS — I5022 Chronic systolic (congestive) heart failure: Secondary | ICD-10-CM | POA: Insufficient documentation

## 2015-09-24 ENCOUNTER — Encounter (HOSPITAL_COMMUNITY)
Admission: RE | Admit: 2015-09-24 | Discharge: 2015-09-24 | Disposition: A | Discharge status: Home / Self Care | Payer: Medicaid Other | Source: Ambulatory Visit | Attending: Cardiology | Admitting: Cardiology

## 2015-09-24 DIAGNOSIS — I5022 Chronic systolic (congestive) heart failure: Secondary | ICD-10-CM | POA: Diagnosis not present

## 2015-09-24 NOTE — Progress Notes (Signed)
Daily Session Note  Patient Details  Name: SYDNIE SIGMUND MRN: [id number] Date of Birth: November 24, 1955 Referring Provider:    Encounter Date: 09/24/2015  Check In:     Session Check In - 09/24/15 1045    Check-In   Location [facility]   Staff Present Benay Pillow, RN, BSN;Diane Coad, MS, EP, [facility], Exercise Physiologist;Other  [facility] physician immediately available to respond to emergencies See telemetry face sheet for immediately available MD   Medication changes reported     No   Fall or balance concerns reported    No   Warm-up and Cool-down Performed as group-led instruction   Resistance Training Performed Yes   VAD Patient? No   Pain Assessment   Currently in Pain? No/denies   Pain Score 0-No pain   Multiple Pain Sites No      Capillary Blood Glucose: No results found for this or any previous visit (from the past 24 hour(s)).   Goals Met:  Independence with exercise equipment Exercise tolerated well No report of cardiac concerns or symptoms Strength training completed today  Goals Unmet:  Not Applicable  Comments: Check out 1145   Dr. Kate Sable is Medical Director for [facility] and Pulmonary Rehab.

## 2015-09-29 ENCOUNTER — Encounter (HOSPITAL_COMMUNITY)
Admission: RE | Admit: 2015-09-29 | Payer: Medicaid Other | Source: Ambulatory Visit | Attending: Cardiology | Admitting: Cardiology

## 2015-10-01 ENCOUNTER — Encounter (HOSPITAL_COMMUNITY)
Admission: RE | Admit: 2015-10-01 | Discharge: 2015-10-01 | Disposition: A | Discharge status: Home / Self Care | Payer: Medicaid Other | Source: Ambulatory Visit | Attending: Cardiology | Admitting: Cardiology

## 2015-10-01 DIAGNOSIS — I5022 Chronic systolic (congestive) heart failure: Secondary | ICD-10-CM | POA: Diagnosis not present

## 2015-10-01 NOTE — Progress Notes (Signed)
Daily Session Note  Patient Details  Name: Hannah Cooper MRN: [id number] Date of Birth: 08-01-1955 Referring Provider:    Encounter Date: 10/01/2015  Check In:     Session Check In - 10/01/15 1045    Check-In   Location [facility]   Staff Present Diane Angelina Pih, MS, EP, [facility], Exercise Physiologist;Vermelle Cammarata Luther Parody, BS, EP, Exercise Physiologist;Christy Oletta Lamas, RN, BSN   Supervising physician immediately available to respond to emergencies See telemetry face sheet for immediately available MD   Medication changes reported     No   Fall or balance concerns reported    No   Warm-up and Cool-down Performed as group-led instruction   Resistance Training Performed Yes   VAD Patient? No   Pain Assessment   Currently in Pain? No/denies   Pain Score 0-No pain   Multiple Pain Sites No      Capillary Blood Glucose: No results found for this or any previous visit (from the past 24 hour(s)).   Goals Met:  Independence with exercise equipment Exercise tolerated well No report of cardiac concerns or symptoms Strength training completed today  Goals Unmet:  Not Applicable  Comments: Check out 1145   Dr. Kate Sable is Medical Director for [facility] and Pulmonary Rehab.

## 2015-10-06 ENCOUNTER — Telehealth: Payer: Self-pay

## 2015-10-06 ENCOUNTER — Encounter (HOSPITAL_COMMUNITY): Payer: Medicaid Other

## 2015-10-06 DIAGNOSIS — Z79899 Other long term (current) drug therapy: Secondary | ICD-10-CM

## 2015-10-06 MED ORDER — CHLORTHALIDONE 25 MG PO TABS: 25.0000 mg | ORAL_TABLET | Freq: Every day | ORAL | Status: DC

## 2015-10-06 NOTE — Telephone Encounter (Signed)
PT MADE AWARE OF MEDICATION CHANGES, WILL REPEAT LABS IN 2 WEEKS.

## 2015-10-08 ENCOUNTER — Encounter (HOSPITAL_COMMUNITY): Payer: Medicaid Other

## 2015-10-13 ENCOUNTER — Encounter (HOSPITAL_COMMUNITY)
Admission: RE | Admit: 2015-10-13 | Discharge: 2015-10-13 | Disposition: A | Discharge status: Home / Self Care | Payer: Medicaid Other | Source: Ambulatory Visit | Attending: Cardiology | Admitting: Cardiology

## 2015-10-13 ENCOUNTER — Telehealth: Payer: Self-pay | Admitting: Cardiology

## 2015-10-13 DIAGNOSIS — I5022 Chronic systolic (congestive) heart failure: Secondary | ICD-10-CM | POA: Diagnosis not present

## 2015-10-13 NOTE — Telephone Encounter (Signed)
Dr. Harl Bowie changed pt's BP meds last week due to it running high, pt is in Cardiac Rehab right now and they're wanting to make sure it's ok for her to continue rehab this week. Pt is in Cardiac Rehab right now, states she'll be waiting there to find out what Dr. Harl Bowie has to say.

## 2015-10-13 NOTE — Progress Notes (Signed)
Daily Session Note  Patient Details  Name: Hannah Cooper MRN: [id number] Date of Birth: 1956/01/01 Referring Provider:    Encounter Date: 10/13/2015  Check In:     Session Check In - 10/13/15 1045    Check-In   Location [facility]   Staff Present Diane Angelina Pih, MS, EP, [facility], Exercise Physiologist;Aahna Rossa Luther Parody, BS, EP, Exercise Physiologist   Supervising physician immediately available to respond to emergencies See telemetry face sheet for immediately available MD   Medication changes reported     No   Fall or balance concerns reported    No   Warm-up and Cool-down Performed as group-led instruction   Resistance Training Performed Yes   VAD Patient? No   Pain Assessment   Currently in Pain? No/denies   Pain Score 0-No pain   Multiple Pain Sites No      Capillary Blood Glucose: No results found for this or any previous visit (from the past 24 hour(s)).   Goals Met:  Independence with exercise equipment Exercise tolerated well No report of cardiac concerns or symptoms Strength training completed today  Goals Unmet:  Not Applicable  Comments: Check out 1145   Dr. Kate Sable is Medical Director for [facility] and Pulmonary Rehab.

## 2015-10-15 ENCOUNTER — Encounter (HOSPITAL_COMMUNITY)
Admission: RE | Admit: 2015-10-15 | Discharge: 2015-10-15 | Disposition: A | Discharge status: Home / Self Care | Payer: Medicaid Other | Source: Ambulatory Visit | Attending: Cardiology | Admitting: Cardiology

## 2015-10-15 DIAGNOSIS — I5022 Chronic systolic (congestive) heart failure: Secondary | ICD-10-CM | POA: Diagnosis not present

## 2015-10-15 NOTE — Progress Notes (Signed)
Daily Session Note  Patient Details  Name: Hannah Cooper MRN: [id number] Date of Birth: 1955/12/07 Referring Provider:    Encounter Date: 10/15/2015  Check In:     Session Check In - 10/15/15 1045    Check-In   Location [facility]   Staff Present Diane Angelina Pih, MS, EP, [facility], Exercise Physiologist;Helayna Dun Luther Parody, BS, EP, Exercise Physiologist   Supervising physician immediately available to respond to emergencies See telemetry face sheet for immediately available MD   Medication changes reported     No   Fall or balance concerns reported    No   Warm-up and Cool-down Performed as group-led instruction   Resistance Training Performed Yes   VAD Patient? No   Pain Assessment   Currently in Pain? No/denies   Pain Score 0-No pain   Multiple Pain Sites No      Capillary Blood Glucose: No results found for this or any previous visit (from the past 24 hour(s)).   Goals Met:  Independence with exercise equipment Exercise tolerated well No report of cardiac concerns or symptoms Strength training completed today  Goals Unmet:  Not Applicable  Comments: Check out 1145   Dr. Kate Sable is Medical Director for [facility] and Pulmonary Rehab.

## 2015-10-19 ENCOUNTER — Encounter: Payer: Self-pay | Admitting: Adult Health

## 2015-10-19 ENCOUNTER — Ambulatory Visit (INDEPENDENT_AMBULATORY_CARE_PROVIDER_SITE_OTHER): Payer: Medicaid Other | Admitting: Adult Health

## 2015-10-19 VITALS — BP 138/90 | HR 74 | Ht 67.0 in | Wt 221.0 lb

## 2015-10-19 DIAGNOSIS — I10 Essential (primary) hypertension: Secondary | ICD-10-CM

## 2015-10-19 DIAGNOSIS — G4730 Sleep apnea, unspecified: Secondary | ICD-10-CM

## 2015-10-19 MED ORDER — SPIRONOLACTONE 25 MG PO TABS: 25.0000 mg | ORAL_TABLET | Freq: Two times a day (BID) | ORAL | Status: DC

## 2015-10-19 NOTE — Patient Instructions (Signed)
Your physician recommends that you schedule a follow-up appointment in: 1 Month with Hannah Sims, NP.  Your physician has recommended you make the following change in your medication:   Increase Spironolactone to 25 mg Two Times Daily   Your physician recommends that you return for lab work in: [place] has recommended that you have a sleep study. This test records several body functions during sleep, including: brain activity, eye movement, oxygen and carbon dioxide blood levels, heart rate and rhythm, breathing rate and rhythm, the flow of air through your mouth and nose, snoring, body muscle movements, and chest and belly movement.  If you need a refill on your cardiac medications before your next appointment, please call your pharmacy.  Thank you for choosing [facility]!

## 2015-10-19 NOTE — Progress Notes (Signed)
Cardiology Office Note   Date:  10/19/2015   ID:  Hannah Cooper, DOB 22-Nov-1955, MRN [id number]  PCP:  Hannah Font, MD  Cardiologist: Hannah Spring, NP   Chief Complaint  Patient presents with  . Hypertension  . Sleep Apnea      History of Present Illness: Hannah Cooper is a 60 y.o. female who presents for ongoing assessment and management of atypical chest pain, most recent cardiac catheterization in 2014 with patent coronary arteries. He was last seen by the office on 09/09/2015 by Hannah Cooper lens PA. The patient has chronic reactive airway disease. She has chest discomfort when this occurs.no changes were made during that office visit. She was referred to pulmonary rehabilitation. However, she did not go in she has lack of transportation.  She was complaining of increased BP at home and called our office. She was started on chlorthalidone 25 mg daily. She has had improvement in BP but still not optimal She continues to feel fatigued and family members state that her snoring is worsened. She had a sleep study one year ago, but only showed minimal OSA and did not need O2 at that time.   Past Medical History  Diagnosis Date  . Meningitis   . Hypertension   . Hyperlipidemia   . CHF (congestive heart failure) ([place])   . Arthritis   . COPD (chronic obstructive pulmonary disease) [facility])     Past Surgical History  Procedure Laterality Date  . Abdominal hysterectomy    . Thyroid surgery  2002  . Left heart catheterization with coronary angiogram N/A 09/05/2012    Procedure: LEFT HEART CATHETERIZATION WITH CORONARY ANGIOGRAM;  Surgeon: Hannah Hampshire, MD;  Location: [facility];  Service: Cardiovascular;  Laterality: N/A;     Current Outpatient Prescriptions  Medication Sig Dispense Refill  . acetaminophen (TYLENOL) 500 MG tablet Take 1,000 mg by mouth every 6 (six) hours as needed.    Marland Kitchen albuterol (PROVENTIL) (2.5 MG/3ML) 0.083% nebulizer solution Take 2.5 mg by  nebulization every 6 (six) hours as needed for wheezing or shortness of breath.    Marland Kitchen amLODipine (NORVASC) 10 MG tablet Take 1 tablet (10 mg total) by mouth daily. 90 tablet 3  . ANORO ELLIPTA 62.5-25 MCG/INH AEPB Inhale 1 puff into the lungs daily.  0  . atorvastatin (LIPITOR) 20 MG tablet Take 20 mg by mouth every evening.     . chlorthalidone (HYGROTON) 25 MG tablet Take 1 tablet (25 mg total) by mouth daily. 90 tablet 3  . cycloSPORINE (RESTASIS) 0.05 % ophthalmic emulsion Place 1 drop into both eyes 2 (two) times daily.     . furosemide (LASIX) 40 MG tablet Take 1 tablet (40 mg total) by mouth daily. 90 tablet 3  . HYDROcodone-acetaminophen (NORCO/VICODIN) 5-325 MG per tablet Take 1 tablet by mouth every 4 (four) hours as needed for moderate pain. 30 tablet 0  . ibuprofen (ADVIL,MOTRIN) 800 MG tablet Take 1 tablet by mouth 2 (two) times daily as needed (BACK PAIN).   2  . isosorbide mononitrate (IMDUR) 30 MG 24 hr tablet TAKE 1 TABLET (30 MG TOTAL) BY MOUTH DAILY. 90 tablet 2  . losartan (COZAAR) 100 MG tablet TAKE ONE TABLET BY MOUTH DAILY FOR BLOOD PRESSURE  0  . meloxicam (MOBIC) 7.5 MG tablet Take 7.5 mg by mouth 2 (two) times daily.  0  . nitroGLYCERIN (NITROSTAT) 0.4 MG SL tablet Place 1 tablet (0.4 mg total) under the tongue every 5 (  five) minutes as needed for chest pain. 25 tablet 6  . potassium chloride SA (KLOR-CON M20) 20 MEQ tablet Take 1 tablet (20 mEq total) by mouth daily. (Patient taking differently: Take 20 mEq by mouth daily as needed (Cramps). ) 90 tablet 3  . predniSONE (DELTASONE) 10 MG tablet Take 2 tablets (20 mg total) by mouth daily. 10 tablet 0  . PROAIR RESPICLICK 123XX123 (90 BASE) MCG/ACT AEPB USE 1-2 PUFFS INTO LUNGS 4 TIMES A DAY AS NEEDED FOR SHORTNESS OF BREATH  0  . spironolactone (ALDACTONE) 25 MG tablet Take 1 tablet (25 mg total) by mouth 2 (two) times daily. 180 tablet 3   No current facility-administered medications for this visit.    Allergies:   Other     Social History:  The patient  reports that she has never smoked. She has never used smokeless tobacco. She reports that she does not drink alcohol or use illicit drugs.   Family History:  The patient's family history includes Heart attack (age of onset: 2) in her sister; Heart attack (age of onset: 73) in her mother.    ROS: All other systems are reviewed and negative. Unless otherwise mentioned in H&P    PHYSICAL EXAM: VS:  BP 138/90 mmHg  Pulse 74  Ht 5\' 7"  (1.702 m)  Wt 221 lb (100.245 kg)  BMI 34.61 kg/m2  SpO2 98% , BMI Body mass index is 34.61 kg/(m^2). GEN: Well nourished, well developed, in no acute distress HEENT: normal Neck: no JVD, carotid bruits, or masses Cardiac: RRR; no murmurs, rubs, or gallops,no edema  Respiratory:  clear to auscultation bilaterally, normal work of breathing GI: soft, nontender, nondistended, + BS MS: no deformity or atrophy Skin: warm and dry, no rash Neuro:  Strength and sensation are intact Psych: euthymic mood, full affect   Recent Labs: 01/04/2015: B Natriuretic Peptide 19.0 01/05/2015: TSH 2.764 03/21/2015: ALT 21 04/26/2015: BUN 15; Creatinine, Ser 0.90; Hemoglobin 12.9; Platelets 245; Potassium 3.3*; Sodium 140    Lipid Panel    Component Value Date/Time   CHOL 102 07/05/2013 0537   TRIG 97 07/05/2013 0537   HDL 46 07/05/2013 0537   CHOLHDL 2.2 07/05/2013 0537   VLDL 19 07/05/2013 0537   LDLCALC 37 07/05/2013 0537      Wt Readings from Last 3 Encounters:  10/19/15 221 lb (100.245 kg)  09/09/15 218 lb (98.884 kg)  09/06/15 201 lb (91.173 kg)     ASSESSMENT AND PLAN:  1. Hypertension: Not controlled, but improved. Will increase spironolactone to 25 mg BID from 25 mg daily. She will have follow up BMET. She is advised on low sodium diet. She continues to struggle with this.   2. OSA: Not diagnosed, with most recent sleep study in April of 2016 demonstrating minimal OSA. Will repeat as her snoring is worsened and BP is  still difficult to control.    Current medicines are reviewed at length with the patient today.    Labs/ tests ordered today include: BMET Sleep Study  Orders Placed This Encounter  Procedures  . Basic Metabolic Panel (BMET)  . Nocturnal polysomnography (NPSG)     Disposition:   FU with one month   Signed, [name], NP  10/19/2015 4:39 PM    [facility] [address] [place], [place] [address] Phone: [phone]; Fax: [phone] and

## 2015-10-19 NOTE — Progress Notes (Signed)
Name: Hannah Cooper    DOB: 1956-01-27  Age: 60 y.o.  MR#: [id number]       PCP:  Maggie Font, MD      Insurance: Payor: MEDICAID [place] / Plan: MEDICAID [place] ACCESS / Product Type: *No Product type* /   CC:   No chief complaint on file.   VS Filed Vitals:   10/19/15 1510  Pulse: 74  Height: 5\' 7"  (1.702 m)  Weight: 221 lb (100.245 kg)  SpO2: 98%    Weights Current Weight  10/19/15 221 lb (100.245 kg)  09/09/15 218 lb (98.884 kg)  09/06/15 201 lb (91.173 kg)    Blood Pressure  BP Readings from Last 3 Encounters:  09/09/15 150/90  09/06/15 158/81  06/17/15 152/92     Admit date:  (Not on file) Last encounter with RMR:  Visit date not found   Allergy Other  Current Outpatient Prescriptions  Medication Sig Dispense Refill  . acetaminophen (TYLENOL) 500 MG tablet Take 1,000 mg by mouth every 6 (six) hours as needed.    Marland Kitchen albuterol (PROVENTIL) (2.5 MG/3ML) 0.083% nebulizer solution Take 2.5 mg by nebulization every 6 (six) hours as needed for wheezing or shortness of breath.    Marland Kitchen amLODipine (NORVASC) 10 MG tablet Take 1 tablet (10 mg total) by mouth daily. 90 tablet 3  . ANORO ELLIPTA 62.5-25 MCG/INH AEPB Inhale 1 puff into the lungs daily.  0  . atorvastatin (LIPITOR) 20 MG tablet Take 20 mg by mouth every evening.     . chlorthalidone (HYGROTON) 25 MG tablet Take 1 tablet (25 mg total) by mouth daily. 90 tablet 3  . cycloSPORINE (RESTASIS) 0.05 % ophthalmic emulsion Place 1 drop into both eyes 2 (two) times daily.     . furosemide (LASIX) 40 MG tablet Take 1 tablet (40 mg total) by mouth daily. 90 tablet 3  . HYDROcodone-acetaminophen (NORCO/VICODIN) 5-325 MG per tablet Take 1 tablet by mouth every 4 (four) hours as needed for moderate pain. 30 tablet 0  . ibuprofen (ADVIL,MOTRIN) 800 MG tablet Take 1 tablet by mouth 2 (two) times daily as needed (BACK PAIN).   2  . isosorbide mononitrate (IMDUR) 30 MG 24 hr tablet TAKE 1 TABLET (30 MG TOTAL) BY MOUTH DAILY. 90 tablet 2   . losartan (COZAAR) 100 MG tablet TAKE ONE TABLET BY MOUTH DAILY FOR BLOOD PRESSURE  0  . meloxicam (MOBIC) 7.5 MG tablet Take 7.5 mg by mouth 2 (two) times daily.  0  . nitroGLYCERIN (NITROSTAT) 0.4 MG SL tablet Place 1 tablet (0.4 mg total) under the tongue every 5 (five) minutes as needed for chest pain. 25 tablet 6  . potassium chloride SA (KLOR-CON M20) 20 MEQ tablet Take 1 tablet (20 mEq total) by mouth daily. (Patient taking differently: Take 20 mEq by mouth daily as needed (Cramps). ) 90 tablet 3  . predniSONE (DELTASONE) 10 MG tablet Take 2 tablets (20 mg total) by mouth daily. 10 tablet 0  . PROAIR RESPICLICK 123XX123 (90 BASE) MCG/ACT AEPB USE 1-2 PUFFS INTO LUNGS 4 TIMES A DAY AS NEEDED FOR SHORTNESS OF BREATH  0  . spironolactone (ALDACTONE) 25 MG tablet Take 0.5 tablets (12.5 mg total) by mouth daily. (Patient taking differently: Take 25 mg by mouth daily. ) 30 tablet 11   No current facility-administered medications for this visit.    Discontinued Meds:   There are no discontinued medications.  Patient Active Problem List   Diagnosis Date Noted  .  COPD (chronic obstructive pulmonary disease) ([place]) 08/20/2014  . Dyspnea 12/19/2013  . Chronic diastolic CHF (congestive heart failure) ([place]) 07/05/2013  . Lower extremity edema 07/05/2013  . Hyperlipidemia   . Chest pain 07/04/2013  . Hypokalemia 09/04/2012  . Precordial pain 09/04/2012  . Viral meningitis 01/22/2011  . Vomiting 01/22/2011  . PNEUMONIA, LEFT LOWER LOBE 10/10/2006  . DISEASE, ACUTE BRONCHOSPASM 10/10/2006  . Hypothyroidism 05/23/2006  . Essential hypertension 05/23/2006  . OSTEOARTHRITIS 05/23/2006    LABS    Component Value Date/Time   NA 140 04/26/2015 2202   NA 138 03/21/2015 1603   NA 140 01/05/2015 0731   K 3.3* 04/26/2015 2202   K 3.5 03/21/2015 1603   K 4.1 01/05/2015 0731   CL 104 04/26/2015 2202   CL 104 03/21/2015 1603   CL 106 01/05/2015 0731   CO2 28 04/26/2015 2202   CO2 28 03/21/2015  1603   CO2 31 01/05/2015 0731   GLUCOSE 122* 04/26/2015 2202   GLUCOSE 96 03/21/2015 1603   GLUCOSE 109* 01/05/2015 0731   BUN 15 04/26/2015 2202   BUN 10 03/21/2015 1603   BUN 11 01/05/2015 0731   CREATININE 0.90 04/26/2015 2202   CREATININE 0.84 03/21/2015 1603   CREATININE 0.73 01/05/2015 0731   CREATININE 0.85 07/12/2013 1558   CREATININE 0.81 09/20/2012 1412   CALCIUM 9.6 04/26/2015 2202   CALCIUM 9.5 03/21/2015 1603   CALCIUM 8.7* 01/05/2015 0731   GFRNONAA >60 04/26/2015 2202   GFRNONAA >60 03/21/2015 1603   GFRNONAA >60 01/05/2015 0731   GFRAA >60 04/26/2015 2202   GFRAA >60 03/21/2015 1603   GFRAA >60 01/05/2015 0731   CMP     Component Value Date/Time   NA 140 04/26/2015 2202   K 3.3* 04/26/2015 2202   CL 104 04/26/2015 2202   CO2 28 04/26/2015 2202   GLUCOSE 122* 04/26/2015 2202   BUN 15 04/26/2015 2202   CREATININE 0.90 04/26/2015 2202   CREATININE 0.85 07/12/2013 1558   CALCIUM 9.6 04/26/2015 2202   PROT 8.5* 03/21/2015 1603   ALBUMIN 4.5 03/21/2015 1603   AST 20 03/21/2015 1603   ALT 21 03/21/2015 1603   ALKPHOS 89 03/21/2015 1603   BILITOT 0.6 03/21/2015 1603   GFRNONAA >60 04/26/2015 2202   GFRAA >60 04/26/2015 2202       Component Value Date/Time   WBC 3.6* 04/26/2015 2202   WBC 4.9 03/21/2015 1603   WBC 3.9* 01/05/2015 0731   HGB 12.9 04/26/2015 2202   HGB 13.5 03/21/2015 1603   HGB 11.8* 01/05/2015 0731   HCT 38.7 04/26/2015 2202   HCT 39.6 03/21/2015 1603   HCT 35.7* 01/05/2015 0731   MCV 89.8 04/26/2015 2202   MCV 89.0 03/21/2015 1603   MCV 89.3 01/05/2015 0731    Lipid Panel     Component Value Date/Time   CHOL 102 07/05/2013 0537   TRIG 97 07/05/2013 0537   HDL 46 07/05/2013 0537   CHOLHDL 2.2 07/05/2013 0537   VLDL 19 07/05/2013 0537   LDLCALC 37 07/05/2013 0537    ABG No results found for: PHART, PCO2ART, PO2ART, HCO3, TCO2, ACIDBASEDEF, O2SAT   Lab Results  Component Value Date   TSH 2.764 01/05/2015   BNP (last 3  results)  Recent Labs  01/04/15 2024  BNP 19.0    ProBNP (last 3 results) No results for input(s): PROBNP in the last 8760 hours.  Cardiac Panel (last 3 results) No results for input(s): CKTOTAL, CKMB, TROPONINI, RELINDX in  the last 72 hours.  Iron/TIBC/Ferritin/ %Sat No results found for: IRON, TIBC, FERRITIN, IRONPCTSAT   EKG Orders placed or performed in visit on 09/09/15  . EKG 12-Lead     Prior Assessment and Plan Problem List as of 10/19/2015      Cardiovascular and Mediastinum   Essential hypertension   Last Assessment & Plan 08/20/2014 Office Visit Written 08/20/2014 11:53 AM by Imogene Burn, PA-C    Uncontrolled. Increase lisinopril to 20 mg daily. 2 g sodium diet.      Chronic diastolic CHF (congestive heart failure) [facility])   Last Assessment & Plan 08/20/2014 Office Visit Written 08/20/2014 11:52 AM by Imogene Burn, PA-C    Patient has acute on chronic diastolic heart failure. Will increase Lasix to 40 mg once daily. Increase potassium to 20 mEq daily. Her blood pressure is also not controlled. Increase lisinopril to 20 mg daily. Check bmet in 2 weeks and see her back in 2 weeks.        Respiratory   PNEUMONIA, LEFT LOWER LOBE   DISEASE, ACUTE BRONCHOSPASM   COPD (chronic obstructive pulmonary disease) [facility])   Last Assessment & Plan 08/20/2014 Office Visit Written 08/20/2014 11:54 AM by Imogene Burn, PA-C    Managed by Dr. Luan Pulling. Currently on antibiotics and steroids.        Endocrine   Hypothyroidism   Last Assessment & Plan 09/20/2012 Office Visit Written 09/20/2012  2:15 PM by Lendon Colonel, NP    Recheck THS        Nervous and Auditory   Viral meningitis     Musculoskeletal and Integument   OSTEOARTHRITIS     Other   Vomiting   Hypokalemia   Last Assessment & Plan 11/12/2012 Office Visit Written 11/12/2012  4:46 PM by Lendon Colonel, NP    Recent labs demonstrate a potassium of 4.6.       Precordial pain   Last Assessment & Plan 09/20/2012  Office Visit Written 09/20/2012  2:16 PM by Lendon Colonel, NP    Resolved.      Chest pain   Last Assessment & Plan 08/20/2014 Office Visit Written 08/20/2014 11:53 AM by Imogene Burn, PA-C    Recent emergency room visit with chest pain in the setting of bronchitis and COPD exacerbation. Troponins EKGs negative CT negative for PE. No further workup.      Hyperlipidemia   Lower extremity edema   Dyspnea   Last Assessment & Plan 05/16/2014 Office Visit Written 05/16/2014  4:20 PM by Lendon Colonel, NP    PFT's abnormal. She is found to have evidence of COPD. Will refer to Dr. Luan Pulling for pulmonology consult. She will be started on Proventil inhaler prn. We will defer any further testing and treatment to Dr. Luan Pulling. She is willing to see him.           Imaging: No results found.

## 2015-10-22 ENCOUNTER — Encounter (HOSPITAL_COMMUNITY)
Admission: RE | Admit: 2015-10-22 | Discharge: 2015-10-22 | Disposition: A | Discharge status: Home / Self Care | Payer: Medicaid Other | Source: Ambulatory Visit | Attending: Cardiology | Admitting: Cardiology

## 2015-10-22 DIAGNOSIS — Z029 Encounter for administrative examinations, unspecified: Secondary | ICD-10-CM | POA: Insufficient documentation

## 2015-10-22 DIAGNOSIS — I5032 Chronic diastolic (congestive) heart failure: Secondary | ICD-10-CM

## 2015-10-22 NOTE — Progress Notes (Signed)
Daily Session Note  Patient Details  Name: CLARISSA LAIRD MRN: [id number] Date of Birth: 23-Mar-1956 Referring Provider:    Encounter Date: 10/22/2015  Check In:     Session Check In - 10/22/15 1140    Check-In   Location [facility]   Staff Present Diane Angelina Pih, MS, EP, [facility], Exercise Physiologist;Mischa Brittingham Luther Parody, BS, EP, Exercise Physiologist;Debra Wynetta Emery, RN, BSN   Supervising physician immediately available to respond to emergencies See telemetry face sheet for immediately available MD   Medication changes reported     No   Fall or balance concerns reported    No   Warm-up and Cool-down Performed as group-led instruction   Resistance Training Performed Yes   VAD Patient? No   Pain Assessment   Currently in Pain? No/denies   Pain Score 0-No pain   Multiple Pain Sites No      Capillary Blood Glucose: No results found for this or any previous visit (from the past 24 hour(s)).   Goals Met:  Independence with exercise equipment Exercise tolerated well No report of cardiac concerns or symptoms Strength training completed today  Goals Unmet:  Not Applicable  Comments: Check out 1145   Dr. Kate Sable is Medical Director for [place] and Pulmonary Rehab.

## 2015-10-27 ENCOUNTER — Other Ambulatory Visit (HOSPITAL_COMMUNITY)
Admission: RE | Admit: 2015-10-27 | Discharge: 2015-10-27 | Disposition: A | Discharge status: Home / Self Care | Payer: Medicaid Other | Source: Ambulatory Visit | Attending: Adult Health | Admitting: Adult Health

## 2015-10-27 ENCOUNTER — Encounter (HOSPITAL_COMMUNITY)
Admission: RE | Admit: 2015-10-27 | Discharge: 2015-10-27 | Disposition: A | Discharge status: Home / Self Care | Payer: Medicaid Other | Source: Ambulatory Visit | Attending: Cardiology | Admitting: Cardiology

## 2015-10-27 DIAGNOSIS — Z029 Encounter for administrative examinations, unspecified: Secondary | ICD-10-CM | POA: Diagnosis not present

## 2015-10-27 DIAGNOSIS — I10 Essential (primary) hypertension: Secondary | ICD-10-CM | POA: Insufficient documentation

## 2015-10-27 DIAGNOSIS — I5032 Chronic diastolic (congestive) heart failure: Secondary | ICD-10-CM

## 2015-10-27 LAB — BASIC METABOLIC PANEL
Anion gap: 4 — ABNORMAL LOW (ref 5–15)
BUN: 15 mg/dL (ref 6–20)
CHLORIDE: 106 mmol/L (ref 101–111)
CO2: 28 mmol/L (ref 22–32)
CREATININE: 0.75 mg/dL (ref 0.44–1.00)
Calcium: 9.3 mg/dL (ref 8.9–10.3)
GFR calc Af Amer: >60 mL/min (ref >60)
GFR calc non Af Amer: >60 mL/min (ref >60)
Glucose, Bld: 118 mg/dL — ABNORMAL HIGH (ref 65–99)
Potassium: 3.8 mmol/L (ref 3.5–5.1)
SODIUM: 138 mmol/L (ref 135–145)

## 2015-10-27 NOTE — Progress Notes (Signed)
Daily Session Note  Patient Details  Name: Hannah Cooper MRN: [id number] Date of Birth: 1955/10/09 Referring Provider:    Encounter Date: 10/27/2015  Check In:     Session Check In - 10/27/15 1045    Check-In   Location [facility]   Staff Present Suzanne Boron, BS, EP, Exercise Physiologist;Arlyne Brandes Wynetta Emery, RN, BSN   Supervising physician immediately available to respond to emergencies See telemetry face sheet for immediately available MD   Medication changes reported     No   Fall or balance concerns reported    No   Warm-up and Cool-down Performed as group-led instruction   Resistance Training Performed Yes   VAD Patient? No   Pain Assessment   Currently in Pain? No/denies   Pain Score 0-No pain   Multiple Pain Sites No      Capillary Blood Glucose: No results found for this or any previous visit (from the past 24 hour(s)).   Goals Met:  Independence with exercise equipment Exercise tolerated well No report of cardiac concerns or symptoms Strength training completed today  Goals Unmet:  Not Applicable  Comments: Check out 1145.   Dr. Kate Sable is Medical Director for [facility] Cardiac and Pulmonary Rehab.

## 2015-10-29 ENCOUNTER — Encounter (HOSPITAL_COMMUNITY): Payer: Medicaid Other

## 2015-11-03 ENCOUNTER — Encounter (HOSPITAL_COMMUNITY)
Admission: RE | Admit: 2015-11-03 | Discharge: 2015-11-03 | Disposition: A | Discharge status: Home / Self Care | Payer: Medicaid Other | Source: Ambulatory Visit | Attending: Cardiology | Admitting: Cardiology

## 2015-11-03 DIAGNOSIS — I5032 Chronic diastolic (congestive) heart failure: Secondary | ICD-10-CM

## 2015-11-03 DIAGNOSIS — Z029 Encounter for administrative examinations, unspecified: Secondary | ICD-10-CM | POA: Diagnosis not present

## 2015-11-03 NOTE — Progress Notes (Signed)
Daily Session Note  Patient Details  Name: Hannah Cooper MRN: [id number] Date of Birth: 1956/02/22 Referring Provider:    Encounter Date: 11/03/2015  Check In:     Session Check In - 11/03/15 1045    Check-In   Location [facility]   Staff Present Diane Angelina Pih, MS, EP, [facility], Exercise Physiologist;Iyanna Drummer Luther Parody, BS, EP, Exercise Physiologist   Supervising physician immediately available to respond to emergencies See telemetry face sheet for immediately available MD   Medication changes reported     No   Fall or balance concerns reported    No   Warm-up and Cool-down Performed as group-led instruction   Resistance Training Performed Yes   VAD Patient? No   Pain Assessment   Currently in Pain? No/denies   Pain Score 0-No pain   Multiple Pain Sites No      Capillary Blood Glucose: No results found for this or any previous visit (from the past 24 hour(s)).   Goals Met:  Independence with exercise equipment Exercise tolerated well No report of cardiac concerns or symptoms Strength training completed today  Goals Unmet:  Not Applicable  Comments: Check out 1145   Dr. Kate Sable is Medical Director for [place] and Pulmonary Rehab.

## 2015-11-05 ENCOUNTER — Encounter (HOSPITAL_COMMUNITY)
Admission: RE | Admit: 2015-11-05 | Discharge: 2015-11-05 | Disposition: A | Discharge status: Home / Self Care | Payer: Medicaid Other | Source: Ambulatory Visit | Attending: Cardiology | Admitting: Cardiology

## 2015-11-05 DIAGNOSIS — I5032 Chronic diastolic (congestive) heart failure: Secondary | ICD-10-CM

## 2015-11-05 DIAGNOSIS — Z029 Encounter for administrative examinations, unspecified: Secondary | ICD-10-CM | POA: Diagnosis not present

## 2015-11-05 NOTE — Progress Notes (Signed)
Daily Session Note  Patient Details  Name: Hannah Cooper MRN: [id number] Date of Birth: [birth year] Referring Provider:    Encounter Date: 11/05/2015  Check In:     Session Check In - 11/05/15 1045    Check-In   Location [facility]   Staff Present Diane Angelina Pih, MS, EP, [facility] Exercise Physiologist;Nekia Maxham Luther Parody, BS, EP, Exercise Physiologist;Debra Wynetta Emery, RN, BSN   Supervising physician immediately available to respond to emergencies See telemetry face sheet for immediately available MD   Medication changes reported     No   Fall or balance concerns reported    No   Warm-up and Cool-down Performed as group-led instruction   Resistance Training Performed Yes   VAD Patient? No   Pain Assessment   Currently in Pain? No/denies   Pain Score 0-No pain   Multiple Pain Sites No      Capillary Blood Glucose: No results found for this or any previous visit (from the past 24 hour(s)).   Goals Met:  Independence with exercise equipment Exercise tolerated well No report of cardiac concerns or symptoms Strength training completed today  Goals Unmet:  Not Applicable  Comments: Check out 1145   Dr. Kate Sable is Medical Director for [place] and Pulmonary Rehab.

## 2015-11-10 ENCOUNTER — Encounter (HOSPITAL_COMMUNITY)
Admission: RE | Admit: 2015-11-10 | Discharge: 2015-11-10 | Disposition: A | Discharge status: Home / Self Care | Payer: Medicaid Other | Source: Ambulatory Visit | Attending: Cardiology | Admitting: Cardiology

## 2015-11-10 DIAGNOSIS — I5032 Chronic diastolic (congestive) heart failure: Secondary | ICD-10-CM

## 2015-11-10 DIAGNOSIS — Z029 Encounter for administrative examinations, unspecified: Secondary | ICD-10-CM | POA: Diagnosis not present

## 2015-11-10 NOTE — Progress Notes (Signed)
Daily Session Note  Patient Details  Name: AMIRE GOSSEN MRN: [id number] Date of Birth: 1955-07-06 Referring Provider:    Encounter Date: 11/10/2015  Check In:     Session Check In - 11/10/15 1045      Check-In   Location [facility]   Staff Present Diane Angelina Pih, MS, EP, [facility], Exercise Physiologist;Yaneisy Wenz Luther Parody, BS, EP, Exercise Physiologist   Supervising physician immediately available to respond to emergencies See telemetry face sheet for immediately available MD   Medication changes reported     No   Fall or balance concerns reported    No   Warm-up and Cool-down Performed as group-led instruction   Resistance Training Performed Yes   VAD Patient? No     Pain Assessment   Currently in Pain? No/denies   Pain Score 0-No pain   Multiple Pain Sites No      Capillary Blood Glucose: No results found for this or any previous visit (from the past 24 hour(s)).   Goals Met:  Independence with exercise equipment Exercise tolerated well No report of cardiac concerns or symptoms Strength training completed today  Goals Unmet:  Not Applicable  Comments: Check out 1145   Dr. Kate Sable is Medical Director for [place] and Pulmonary Rehab.

## 2015-11-12 ENCOUNTER — Encounter (HOSPITAL_COMMUNITY)
Admission: RE | Admit: 2015-11-12 | Discharge: 2015-11-12 | Disposition: A | Discharge status: Home / Self Care | Payer: Medicaid Other | Source: Ambulatory Visit | Attending: Cardiology | Admitting: Cardiology

## 2015-11-12 DIAGNOSIS — Z029 Encounter for administrative examinations, unspecified: Secondary | ICD-10-CM | POA: Diagnosis not present

## 2015-11-12 DIAGNOSIS — I5032 Chronic diastolic (congestive) heart failure: Secondary | ICD-10-CM

## 2015-11-12 NOTE — Progress Notes (Signed)
Daily Session Note  Patient Details  Name: Hannah Cooper MRN: [id number] Date of Birth: 1955-10-17 Referring Provider:    Encounter Date: 11/12/2015  Check In:     Session Check In - 11/12/15 1045      Check-In   Location [facility]   Staff Present Diane Angelina Pih, MS, EP, [facility], Exercise Physiologist;Debra Wynetta Emery, RN, BSN;Aslin Farinas, BS, EP, Exercise Physiologist   Supervising physician immediately available to respond to emergencies See telemetry face sheet for immediately available MD   Medication changes reported     No   Fall or balance concerns reported    No   Warm-up and Cool-down Performed as group-led instruction   Resistance Training Performed Yes   VAD Patient? No     Pain Assessment   Currently in Pain? No/denies   Pain Score 0-No pain   Multiple Pain Sites No      Capillary Blood Glucose: No results found for this or any previous visit (from the past 24 hour(s)).   Goals Met:  Independence with exercise equipment Exercise tolerated well No report of cardiac concerns or symptoms Strength training completed today  Goals Unmet:  Not Applicable  Comments: Check out 1145   Dr. Kate Sable is Medical Director for [place] and Pulmonary Rehab.

## 2015-11-17 ENCOUNTER — Encounter (HOSPITAL_COMMUNITY)
Admission: RE | Admit: 2015-11-17 | Discharge: 2015-11-17 | Disposition: A | Discharge status: Home / Self Care | Payer: Medicaid Other | Source: Ambulatory Visit | Attending: Cardiology | Admitting: Cardiology

## 2015-11-17 DIAGNOSIS — I5032 Chronic diastolic (congestive) heart failure: Secondary | ICD-10-CM | POA: Diagnosis not present

## 2015-11-17 NOTE — Progress Notes (Signed)
Daily Session Note  Patient Details  Name: Hannah Cooper MRN: [id number] Date of Birth: 24-Oct-1955 Referring Provider:    Encounter Date: 11/17/2015  Check In:     Session Check In - 11/17/15 1045      Check-In   Location [facility]   Staff Present Diane Angelina Pih, MS, EP, [facility], Exercise Physiologist;Jaekwon Mcclune Luther Parody, BS, EP, Exercise Physiologist   Supervising physician immediately available to respond to emergencies See telemetry face sheet for immediately available MD   Medication changes reported     No   Fall or balance concerns reported    No   Warm-up and Cool-down Performed as group-led instruction   Resistance Training Performed Yes   VAD Patient? No     Pain Assessment   Currently in Pain? No/denies   Pain Score 0-No pain   Multiple Pain Sites No      Capillary Blood Glucose: No results found for this or any previous visit (from the past 24 hour(s)).   Goals Met:  Independence with exercise equipment Exercise tolerated well No report of cardiac concerns or symptoms Strength training completed today  Goals Unmet:  Not Applicable  Comments: Check out 1145   Dr. Emily Filbert is Medical Director for [place] and [facility] Pulmonary Rehabilitation.

## 2015-11-19 ENCOUNTER — Encounter (HOSPITAL_COMMUNITY)
Admission: RE | Admit: 2015-11-19 | Discharge: 2015-11-19 | Disposition: A | Discharge status: Home / Self Care | Payer: Medicaid Other | Source: Ambulatory Visit | Attending: Cardiology | Admitting: Cardiology

## 2015-11-19 DIAGNOSIS — I5032 Chronic diastolic (congestive) heart failure: Secondary | ICD-10-CM | POA: Diagnosis not present

## 2015-11-19 NOTE — Progress Notes (Signed)
Daily Session Note  Patient Details  Name: Hannah Cooper MRN: [id number] Date of Birth: 1955-11-11 Referring Provider:    Encounter Date: 11/19/2015  Check In:     Session Check In - 11/19/15 1045      Check-In   Location [facility]   Staff Present Diane Angelina Pih, MS, EP, [facility], Exercise Physiologist;Debra Wynetta Emery, RN, BSN;Kavontae Pritchard, BS, EP, Exercise Physiologist   Supervising physician immediately available to respond to emergencies See telemetry face sheet for immediately available MD   Medication changes reported     No   Fall or balance concerns reported    No   Warm-up and Cool-down Performed as group-led instruction   Resistance Training Performed Yes   VAD Patient? No     Pain Assessment   Currently in Pain? No/denies   Pain Score 0-No pain   Multiple Pain Sites No      Capillary Blood Glucose: No results found for this or any previous visit (from the past 24 hour(s)).   Goals Met:  Independence with exercise equipment Exercise tolerated well No report of cardiac concerns or symptoms Strength training completed today  Goals Unmet:  Not Applicable  Comments: Check out 1145   Dr. Kate Sable is Medical Director for [place] and Pulmonary Rehab.

## 2015-11-24 ENCOUNTER — Encounter (HOSPITAL_COMMUNITY)
Admission: RE | Admit: 2015-11-24 | Discharge: 2015-11-24 | Disposition: A | Discharge status: Home / Self Care | Payer: Medicaid Other | Source: Ambulatory Visit | Attending: Cardiology | Admitting: Cardiology

## 2015-11-24 DIAGNOSIS — I5032 Chronic diastolic (congestive) heart failure: Secondary | ICD-10-CM | POA: Diagnosis not present

## 2015-11-24 NOTE — Progress Notes (Signed)
Daily Session Note  Patient Details  Name: Hannah Cooper MRN: [id number] Date of Birth: 10/08/1955 Referring Provider:    Encounter Date: 11/24/2015  Check In:     Session Check In - 11/24/15 1045      Check-In   Location [facility]   Staff Present Aundra Dubin, RN, BSN;Diane Coad, MS, EP, [facility], Exercise Physiologist;Jameer Storie Luther Parody, BS, EP, Exercise Physiologist   Supervising physician immediately available to respond to emergencies See telemetry face sheet for immediately available MD   Medication changes reported     No   Fall or balance concerns reported    No   Warm-up and Cool-down Performed as group-led instruction   Resistance Training Performed Yes   VAD Patient? No     Pain Assessment   Currently in Pain? No/denies   Pain Score 0-No pain   Multiple Pain Sites No      Capillary Blood Glucose: No results found for this or any previous visit (from the past 24 hour(s)).   Goals Met:  Independence with exercise equipment Exercise tolerated well No report of cardiac concerns or symptoms Strength training completed today  Goals Unmet:  Not Applicable  Comments: Check out 1145   Dr. Kate Sable is Medical Director for [place] and Pulmonary Rehab.

## 2015-11-26 ENCOUNTER — Encounter (HOSPITAL_COMMUNITY)
Admission: RE | Admit: 2015-11-26 | Discharge: 2015-11-26 | Disposition: A | Discharge status: Home / Self Care | Payer: Medicaid Other | Source: Ambulatory Visit | Attending: Cardiology | Admitting: Cardiology

## 2015-11-26 DIAGNOSIS — I5032 Chronic diastolic (congestive) heart failure: Secondary | ICD-10-CM

## 2015-11-26 NOTE — Progress Notes (Signed)
Daily Session Note  Patient Details  Name: Hannah Cooper MRN: [id number] Date of Birth: 06/11/1955 Referring Provider:    Encounter Date: 11/26/2015  Check In:     Session Check In - 11/26/15 1045      Check-In   Location [facility]   Staff Present Diane Coad, MS, EP, [facility], Exercise Physiologist;Debra Johnson, RN, BSN;Mareo Portilla, BS, EP, Exercise Physiologist   Supervising physician immediately available to respond to emergencies See telemetry face sheet for immediately available MD   Medication changes reported     No   Fall or balance concerns reported    No   Warm-up and Cool-down Performed as group-led instruction   Resistance Training Performed Yes   VAD Patient? No     Pain Assessment   Currently in Pain? No/denies   Pain Score 0-No pain   Multiple Pain Sites No      Capillary Blood Glucose: No results found for this or any previous visit (from the past 24 hour(s)).   Goals Met:  Independence with exercise equipment Exercise tolerated well No report of cardiac concerns or symptoms Strength training completed today  Goals Unmet:  Not Applicable  Comments: Check out 1145   Dr. Suresh Koneswaran is Medical Director for [place] [facility]. 

## 2015-11-30 ENCOUNTER — Ambulatory Visit: Payer: Medicaid Other | Admitting: Adult Health

## 2015-11-30 ENCOUNTER — Encounter: Payer: Self-pay | Admitting: Adult Health

## 2015-11-30 NOTE — Progress Notes (Deleted)
Cardiology Office Note   Date:  11/30/2015   ID:  Hannah Cooper, DOB 1955-11-09, MRN [id number]  PCP:  Maggie Font, MD  Cardiologist: Cloria Spring, NP   No chief complaint on file.     History of Present Illness: Hannah Cooper is a 60 y.o. female who presents for ongoing assessment and management of atypical chest pain,patent coronary arteries per catheterization 2014, with ongoing chest discomfort. Patient also has a history of reactive lung disease, hypertension. Patient has not been diagnosed with sleep apnea with most recent sleep study April of 2016    Past Medical History:  Diagnosis Date  . Arthritis   . CHF (congestive heart failure) ([place])   . COPD (chronic obstructive pulmonary disease) ([place])   . Hyperlipidemia   . Hypertension   . Meningitis     Past Surgical History:  Procedure Laterality Date  . ABDOMINAL HYSTERECTOMY    . LEFT HEART CATHETERIZATION WITH CORONARY ANGIOGRAM N/A 09/05/2012   Procedure: LEFT HEART CATHETERIZATION WITH CORONARY ANGIOGRAM;  Surgeon: Wellington Hampshire, MD;  Location: [place] C[facility];  Service: Cardiovascular;  Laterality: N/A;  . THYROID SURGERY  2002     Current Outpatient Prescriptions  Medication Sig Dispense Refill  . acetaminophen (TYLENOL) 500 MG tablet Take 1,000 mg by mouth every 6 (six) hours as needed.    Marland Kitchen albuterol (PROVENTIL) (2.5 MG/3ML) 0.083% nebulizer solution Take 2.5 mg by nebulization every 6 (six) hours as needed for wheezing or shortness of breath.    Marland Kitchen amLODipine (NORVASC) 10 MG tablet Take 1 tablet (10 mg total) by mouth daily. 90 tablet 3  . ANORO ELLIPTA 62.5-25 MCG/INH AEPB Inhale 1 puff into the lungs daily.  0  . atorvastatin (LIPITOR) 20 MG tablet Take 20 mg by mouth every evening.     . chlorthalidone (HYGROTON) 25 MG tablet Take 1 tablet (25 mg total) by mouth daily. 90 tablet 3  . cycloSPORINE (RESTASIS) 0.05 % ophthalmic emulsion Place 1 drop into both eyes 2 (two) times daily.     .  furosemide (LASIX) 40 MG tablet Take 1 tablet (40 mg total) by mouth daily. 90 tablet 3  . HYDROcodone-acetaminophen (NORCO/VICODIN) 5-325 MG per tablet Take 1 tablet by mouth every 4 (four) hours as needed for moderate pain. 30 tablet 0  . ibuprofen (ADVIL,MOTRIN) 800 MG tablet Take 1 tablet by mouth 2 (two) times daily as needed (BACK PAIN).   2  . isosorbide mononitrate (IMDUR) 30 MG 24 hr tablet TAKE 1 TABLET (30 MG TOTAL) BY MOUTH DAILY. 90 tablet 2  . losartan (COZAAR) 100 MG tablet TAKE ONE TABLET BY MOUTH DAILY FOR BLOOD PRESSURE  0  . meloxicam (MOBIC) 7.5 MG tablet Take 7.5 mg by mouth 2 (two) times daily.  0  . nitroGLYCERIN (NITROSTAT) 0.4 MG SL tablet Place 1 tablet (0.4 mg total) under the tongue every 5 (five) minutes as needed for chest pain. 25 tablet 6  . potassium chloride SA (KLOR-CON M20) 20 MEQ tablet Take 1 tablet (20 mEq total) by mouth daily. (Patient taking differently: Take 20 mEq by mouth daily as needed (Cramps). ) 90 tablet 3  . predniSONE (DELTASONE) 10 MG tablet Take 2 tablets (20 mg total) by mouth daily. 10 tablet 0  . PROAIR RESPICLICK 123XX123 (90 BASE) MCG/ACT AEPB USE 1-2 PUFFS INTO LUNGS 4 TIMES A DAY AS NEEDED FOR SHORTNESS OF BREATH  0  . spironolactone (ALDACTONE) 25 MG tablet Take 1 tablet (25  mg total) by mouth 2 (two) times daily. 180 tablet 3   No current facility-administered medications for this visit.     Allergies:   Other    Social History:  The patient  reports that she has never smoked. She has never used smokeless tobacco. She reports that she does not drink alcohol or use drugs.   Family History:  The patient's family history includes Heart attack (age of onset: 19) in her sister; Heart attack (age of onset: 26) in her mother.    ROS: All other systems are reviewed and negative. Unless otherwise mentioned in H&P    PHYSICAL EXAM: VS:  There were no vitals taken for this visit. , BMI There is no height or weight on file to calculate  BMI. GEN: Well nourished, well developed, in no acute distress  HEENT: normal  Neck: no JVD, carotid bruits, or masses Cardiac: ***RRR; no murmurs, rubs, or gallops,no edema  Respiratory:  clear to auscultation bilaterally, normal work of breathing GI: soft, nontender, nondistended, + BS MS: no deformity or atrophy  Skin: warm and dry, no rash Neuro:  Strength and sensation are intact Psych: euthymic mood, full affect   EKG:  EKG {ACTION; IS/IS [id number] ordered today. The ekg ordered today demonstrates ***   Recent Labs: 01/04/2015: B Natriuretic Peptide 19.0 01/05/2015: TSH 2.764 03/21/2015: ALT 21 04/26/2015: Hemoglobin 12.9; Platelets 245 10/27/2015: BUN 15; Creatinine, Ser 0.75; Potassium 3.8; Sodium 138    Lipid Panel    Component Value Date/Time   CHOL 102 07/05/2013 0537   TRIG 97 07/05/2013 0537   HDL 46 07/05/2013 0537   CHOLHDL 2.2 07/05/2013 0537   VLDL 19 07/05/2013 0537   LDLCALC 37 07/05/2013 0537      Wt Readings from Last 3 Encounters:  10/19/15 221 lb (100.2 kg)  09/09/15 218 lb (98.9 kg)  09/06/15 201 lb (91.2 kg)      Other studies Reviewed: Additional studies/ records that were reviewed today include: ***. Review of the above records demonstrates: ***   ASSESSMENT AND PLAN:  1.  ***   Current medicines are reviewed at length with the patient today.    Labs/ tests ordered today include: *** No orders of the defined types were placed in this encounter.    Disposition:   FU with *** in {gen number [id number] {TIME; UNITS DAY/WEEK/MONTH:19136}   Signed, [name], NP  11/30/2015 7:36 AM    [facility] [address], [place], [place] [address] Phone: [phone]; Fax: [phone]

## 2015-12-01 ENCOUNTER — Encounter (HOSPITAL_COMMUNITY)
Admission: RE | Admit: 2015-12-01 | Discharge: 2015-12-01 | Disposition: A | Discharge status: Home / Self Care | Payer: Medicaid Other | Source: Ambulatory Visit | Attending: Cardiology | Admitting: Cardiology

## 2015-12-01 DIAGNOSIS — I5032 Chronic diastolic (congestive) heart failure: Secondary | ICD-10-CM

## 2015-12-01 NOTE — Progress Notes (Signed)
Daily Session Note  Patient Details  Name: Hannah Cooper MRN: [id number] Date of Birth: 1956/03/29 Referring Provider:    Encounter Date: 12/01/2015  Check In:     Session Check In - 12/01/15 1045      Check-In   Location [facility]   Staff Present Diane Angelina Pih, MS, EP, [facility], Exercise Physiologist;Taevin Mcferran Luther Parody, BS, EP, Exercise Physiologist   Supervising physician immediately available to respond to emergencies See telemetry face sheet for immediately available MD   Medication changes reported     No   Fall or balance concerns reported    No   Warm-up and Cool-down Performed as group-led instruction   Resistance Training Performed Yes   VAD Patient? No     Pain Assessment   Currently in Pain? No/denies   Pain Score 0-No pain   Multiple Pain Sites No      Capillary Blood Glucose: No results found for this or any previous visit (from the past 24 hour(s)).   Goals Met:  Independence with exercise equipment Exercise tolerated well No report of cardiac concerns or symptoms Strength training completed today  Goals Unmet:  Not Applicable  Comments: Check out 1145   Dr. Kate Sable is Medical Director for [place] and Pulmonary Rehab.

## 2015-12-03 ENCOUNTER — Encounter (HOSPITAL_COMMUNITY)
Admission: RE | Admit: 2015-12-03 | Discharge: 2015-12-03 | Disposition: A | Discharge status: Home / Self Care | Payer: Medicaid Other | Source: Ambulatory Visit | Attending: Cardiology | Admitting: Cardiology

## 2015-12-03 DIAGNOSIS — I5032 Chronic diastolic (congestive) heart failure: Secondary | ICD-10-CM | POA: Diagnosis not present

## 2015-12-08 ENCOUNTER — Encounter (HOSPITAL_COMMUNITY): Payer: Medicaid Other

## 2015-12-22 ENCOUNTER — Encounter: Payer: Self-pay | Admitting: Cardiology

## 2015-12-22 ENCOUNTER — Ambulatory Visit (INDEPENDENT_AMBULATORY_CARE_PROVIDER_SITE_OTHER): Payer: Medicaid Other | Admitting: Cardiology

## 2015-12-22 VITALS — BP 138/84 | HR 74 | Ht 67.0 in | Wt 228.0 lb

## 2015-12-22 DIAGNOSIS — I10 Essential (primary) hypertension: Secondary | ICD-10-CM

## 2015-12-22 DIAGNOSIS — R55 Syncope and collapse: Secondary | ICD-10-CM

## 2015-12-22 DIAGNOSIS — R079 Chest pain, unspecified: Secondary | ICD-10-CM

## 2015-12-22 DIAGNOSIS — I5032 Chronic diastolic (congestive) heart failure: Secondary | ICD-10-CM

## 2015-12-22 NOTE — Patient Instructions (Signed)
Medication Instructions:  I HAVE GIVEN YOU A PRESCRIPTION FOR COMPRESSION STOCKINGS   Your physician recommends that you continue on your current medications as directed. Please refer to the Current Medication list given to you today.   Labwork: I WILL REQUEST LABS FORM PCP  Testing/Procedures: NONE  Follow-Up: Your physician wants you to follow-up in: 6 MONTHS.  You will receive a reminder letter in the mail two months in advance. If you don't receive a letter, please call our office to schedule the follow-up appointment.   Any Other Special Instructions Will Be Listed Below (If Applicable).     If you need a refill on your cardiac medications before your next appointment, please call your pharmacy.

## 2015-12-22 NOTE — Progress Notes (Signed)
Clinical Summary Ms. Selke is a 60 y.o.female seen today for follow up of the following medical problems.   1. Syncope - no recurrent episodes, previous negative event monitor.   2. Chest pain - long history of atypical chest pain - cath 2014 with patent coronaries. -  admit 12/2014 with chest pain, negative evaluation for ACS.     2 episodes of chest pain since last visit. Similar to previous episodes, overall unchanged.   3. COPD - noted on 12/2013 PFTs - followed Dr Luan Pulling  4. HTN - compliant with meds - home bp's running around 140s/90s.     5. Chronic diastolic heart failure - echo 06/2013 LVEF 55-60%, abnormal diastolic function.  - compliant with lasix prn. She is on daily chlorthalidone.    6. 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 She has 3 adopted kids (9,10,11) Past Medical History:  Diagnosis Date  . Arthritis   . CHF (congestive heart failure) ([place])   . COPD (chronic obstructive pulmonary disease) ([place])   . Hyperlipidemia   . Hypertension   . Meningitis      Allergies  Allergen Reactions  . Other Swelling    Avon lipstick     Current Outpatient Prescriptions  Medication Sig Dispense Refill  . acetaminophen (TYLENOL) 500 MG tablet Take 1,000 mg by mouth every 6 (six) hours as needed.    Marland Kitchen albuterol (PROVENTIL) (2.5 MG/3ML) 0.083% nebulizer solution Take 2.5 mg by nebulization every 6 (six) hours as needed for wheezing or shortness of breath.    Marland Kitchen amLODipine (NORVASC) 10 MG tablet Take 1 tablet (10 mg total) by mouth daily. 90 tablet 3  . ANORO ELLIPTA 62.5-25 MCG/INH AEPB Inhale 1 puff into the lungs daily.  0  . atorvastatin (LIPITOR) 20 MG tablet Take 20 mg by mouth every evening.     . chlorthalidone (HYGROTON) 25 MG tablet Take 1 tablet (25 mg total) by mouth daily. 90 tablet 3  . cycloSPORINE (RESTASIS) 0.05 %  ophthalmic emulsion Place 1 drop into both eyes 2 (two) times daily.     . furosemide (LASIX) 40 MG tablet Take 1 tablet (40 mg total) by mouth daily. 90 tablet 3  . HYDROcodone-acetaminophen (NORCO/VICODIN) 5-325 MG per tablet Take 1 tablet by mouth every 4 (four) hours as needed for moderate pain. 30 tablet 0  . ibuprofen (ADVIL,MOTRIN) 800 MG tablet Take 1 tablet by mouth 2 (two) times daily as needed (BACK PAIN).   2  . isosorbide mononitrate (IMDUR) 30 MG 24 hr tablet TAKE 1 TABLET (30 MG TOTAL) BY MOUTH DAILY. 90 tablet 2  . losartan (COZAAR) 100 MG tablet TAKE ONE TABLET BY MOUTH DAILY FOR BLOOD PRESSURE  0  . meloxicam (MOBIC) 7.5 MG tablet Take 7.5 mg by mouth 2 (two) times daily.  0  . nitroGLYCERIN (NITROSTAT) 0.4 MG SL tablet Place 1 tablet (0.4 mg total) under the tongue every 5 (five) minutes as needed for chest pain. 25 tablet 6  . potassium chloride SA (KLOR-CON M20) 20 MEQ tablet Take 1 tablet (20 mEq total) by mouth daily. (Patient taking differently: Take 20 mEq by mouth daily as needed (Cramps). ) 90 tablet 3  . predniSONE (DELTASONE) 10 MG tablet Take 2 tablets (20 mg total) by mouth daily. 10 tablet 0  . PROAIR RESPICLICK 123XX123 (90 BASE) MCG/ACT AEPB USE 1-2  PUFFS INTO LUNGS 4 TIMES A DAY AS NEEDED FOR SHORTNESS OF BREATH  0  . spironolactone (ALDACTONE) 25 MG tablet Take 1 tablet (25 mg total) by mouth 2 (two) times daily. 180 tablet 3   No current facility-administered medications for this visit.      Past Surgical History:  Procedure Laterality Date  . ABDOMINAL HYSTERECTOMY    . LEFT HEART CATHETERIZATION WITH CORONARY ANGIOGRAM N/A 09/05/2012   Procedure: LEFT HEART CATHETERIZATION WITH CORONARY ANGIOGRAM;  Surgeon: Wellington Hampshire, MD;  Location: [facility] CATH [facility];  Service: Cardiovascular;  Laterality: N/A;  . THYROID SURGERY  2002     Allergies  Allergen Reactions  . Other Swelling    Avon lipstick      Family History  Problem Relation Age of Onset  . Heart  attack Mother 76  . Heart attack Sister 67     Social History Ms. Epler reports that she has never smoked. She has never used smokeless tobacco. Ms. Caughey reports that she does not drink alcohol.   Review of Systems CONSTITUTIONAL: No weight loss, fever, chills, weakness or fatigue.  HEENT: Eyes: No visual loss, blurred vision, double vision or yellow sclerae.No hearing loss, sneezing, congestion, runny nose or sore throat.  SKIN: No rash or itching.  CARDIOVASCULAR: per hpI RESPIRATORY: PER hpi GASTROINTESTINAL: No anorexia, nausea, vomiting or diarrhea. No abdominal pain or blood.  GENITOURINARY: No burning on urination, no polyuria NEUROLOGICAL: No headache, dizziness, syncope, paralysis, ataxia, numbness or tingling in the extremities. No change in bowel or bladder control.  MUSCULOSKELETAL: No muscle, back pain, joint pain or stiffness.  LYMPHATICS: No enlarged nodes. No history of splenectomy.  PSYCHIATRIC: No history of depression or anxiety.  ENDOCRINOLOGIC: No reports of sweating, cold or heat intolerance. No polyuria or polydipsia.  Marland Kitchen   Physical Examination Vitals:   12/22/15 1109  BP: 138/84  Pulse: 74   Vitals:   12/22/15 1109  Weight: 228 lb (103.4 kg)  Height: 5\' 7"  (1.702 m)    Gen: resting comfortably, no acute distress HEENT: no scleral icterus, pupils equal round and reactive, no palptable cervical adenopathy,  CV: RRR, no m/r/g, no jvd Resp: Clear to auscultation bilaterally GI: abdomen is soft, non-tender, non-distended, normal bowel sounds, no hepatosplenomegaly MSK: extremities are warm, no edema.  Skin: warm, no rash Neuro:  no focal deficits Psych: appropriate affect   Diagnostic Studies 08/2012 cath Hemodynamics: AO: 164/82 mmHg LV: 169/14 mmHg LVEDP: 24 mmHg  Coronary angiography: Coronary dominance: Right   Left Main: Normal  Left Anterior Descending (LAD): Normal in size with no significant disease.  1st  diagonal (D1): Small in size with minor irregularities.  2nd diagonal (D2): Normal in size with no significant disease.  3rd diagonal (D3): Normal in size with no significant disease.  Circumflex (LCx): Normal in size and nondominant. The vessel has no significant disease.  1st obtuse marginal: Small in size with no significant disease.  2nd obtuse marginal: Small in size with no significant disease.  3rd obtuse marginal: Large in size with no significant disease.  Right Coronary Artery: Normal in size and dominant. The vessel has no significant disease.  posterior descending artery: Normal in size with no significant disease.  posterior lateral branchs: Normal in size with no significant disease.  Left ventriculography: Left ventricular systolic function is normal , LVEF is estimated at 60-65% %, there is no significant mitral regurgitation   Final Conclusions:  1. Normal coronary arteries. 2. Normal LV systolic function.  3. Moderately elevated left ventricular end-diastolic pressure likely due to diastolic heart failure.   06/2013 echo Study Conclusions  - Procedure narrative: Transthoracic echocardiography. Image quality was suboptimal. The study was technically difficult, as a result of poor sound wave transmission and restricted patient mobility. - Left ventricle: The cavity size was normal. Wall thickness was increased in a pattern of mild to moderate LVH. Diastolic dysfunction noted, grade indeterminant. Systolic function was normal. The estimated ejection fraction was in the range of 55% to 60%. Wall motion was normal; there were no regional wall motion abnormalities.   Jan 2017 Event monitor: no arrhythmias  06/2014 CPX Conclusion: Exercise testing with gas exchange demonstrates a mild-moderately reduced functional capacity when compared to matched sedentary norms. There was an in-adequate HR response to the exercise response  and flat O2 pulse. This high HR reserve was likely high due to pulmonary limitations based on restrictive spirometry and RR 45 at peak exercise and reaching maximum ventilatory limits. However, it could have easily been due to poor effort. The sub-maximal effort is clouding interpretation of the test. Nevertheless, correlation of resting spirometry test Showing restriction with outpatient pulmonary function test is warranted     Assessment and Plan   1. Syncope - history would suggest possible vasovagal vs orthostatic syncope. Event monitor without significant arrhythmias, recent echo without significant pathology - no recurrrent episodes. Continue to monitor  2. Chest pain - long history of atypical chest pain. Negative cath in 2014 - symptoms unchanged, continue to monitor.   3. COPD - per Dr Luan Pulling   4. HTN - at goal,continue current meds   5. Chronic diastolic HF - appears euvolemic, continue current meds - order compression stockings.   F/u 6 months. Request pcp labs.   Arnoldo Lenis, M.D.

## 2015-12-29 ENCOUNTER — Other Ambulatory Visit: Payer: Self-pay | Admitting: Adult Health

## 2016-01-14 NOTE — Progress Notes (Signed)
This encounter was created in error - please disregard.

## 2016-03-07 ENCOUNTER — Other Ambulatory Visit: Payer: Self-pay | Admitting: Adult Health

## 2016-03-12 DIAGNOSIS — J449 Chronic obstructive pulmonary disease, unspecified: Secondary | ICD-10-CM | POA: Insufficient documentation

## 2016-03-12 DIAGNOSIS — I5032 Chronic diastolic (congestive) heart failure: Secondary | ICD-10-CM | POA: Diagnosis not present

## 2016-03-12 DIAGNOSIS — E785 Hyperlipidemia, unspecified: Secondary | ICD-10-CM | POA: Diagnosis not present

## 2016-03-12 DIAGNOSIS — I110 Hypertensive heart disease with heart failure: Secondary | ICD-10-CM | POA: Diagnosis not present

## 2016-03-12 DIAGNOSIS — R531 Weakness: Secondary | ICD-10-CM | POA: Diagnosis present

## 2016-03-12 DIAGNOSIS — Z90710 Acquired absence of both cervix and uterus: Secondary | ICD-10-CM | POA: Diagnosis not present

## 2016-03-12 DIAGNOSIS — R112 Nausea with vomiting, unspecified: Secondary | ICD-10-CM | POA: Diagnosis not present

## 2016-03-12 DIAGNOSIS — R002 Palpitations: Secondary | ICD-10-CM | POA: Diagnosis not present

## 2016-03-12 DIAGNOSIS — K5900 Constipation, unspecified: Secondary | ICD-10-CM | POA: Insufficient documentation

## 2016-03-13 ENCOUNTER — Encounter (HOSPITAL_COMMUNITY): Payer: Self-pay | Admitting: *Deleted

## 2016-03-13 ENCOUNTER — Emergency Department (HOSPITAL_COMMUNITY): Payer: Medicaid Other

## 2016-03-13 ENCOUNTER — Observation Stay (HOSPITAL_COMMUNITY)
Admission: EM | Admit: 2016-03-13 | Discharge: 2016-03-15 | Disposition: A | Discharge status: Home Health | Payer: Medicaid Other | Attending: Internal Medicine | Admitting: Internal Medicine

## 2016-03-13 ENCOUNTER — Observation Stay (HOSPITAL_COMMUNITY): Payer: Medicaid Other

## 2016-03-13 ENCOUNTER — Other Ambulatory Visit (HOSPITAL_COMMUNITY): Payer: Medicaid Other

## 2016-03-13 DIAGNOSIS — R079 Chest pain, unspecified: Secondary | ICD-10-CM

## 2016-03-13 DIAGNOSIS — R002 Palpitations: Secondary | ICD-10-CM

## 2016-03-13 DIAGNOSIS — J449 Chronic obstructive pulmonary disease, unspecified: Secondary | ICD-10-CM | POA: Diagnosis present

## 2016-03-13 DIAGNOSIS — R112 Nausea with vomiting, unspecified: Secondary | ICD-10-CM

## 2016-03-13 DIAGNOSIS — M79609 Pain in unspecified limb: Secondary | ICD-10-CM

## 2016-03-13 DIAGNOSIS — Z90710 Acquired absence of both cervix and uterus: Secondary | ICD-10-CM | POA: Diagnosis not present

## 2016-03-13 DIAGNOSIS — I5032 Chronic diastolic (congestive) heart failure: Secondary | ICD-10-CM | POA: Diagnosis not present

## 2016-03-13 DIAGNOSIS — R531 Weakness: Principal | ICD-10-CM

## 2016-03-13 DIAGNOSIS — E785 Hyperlipidemia, unspecified: Secondary | ICD-10-CM | POA: Diagnosis not present

## 2016-03-13 DIAGNOSIS — I639 Cerebral infarction, unspecified: Secondary | ICD-10-CM

## 2016-03-13 DIAGNOSIS — J438 Other emphysema: Secondary | ICD-10-CM

## 2016-03-13 DIAGNOSIS — R202 Paresthesia of skin: Secondary | ICD-10-CM

## 2016-03-13 DIAGNOSIS — I10 Essential (primary) hypertension: Secondary | ICD-10-CM

## 2016-03-13 DIAGNOSIS — M6281 Muscle weakness (generalized): Secondary | ICD-10-CM

## 2016-03-13 DIAGNOSIS — I110 Hypertensive heart disease with heart failure: Secondary | ICD-10-CM | POA: Diagnosis not present

## 2016-03-13 DIAGNOSIS — K5900 Constipation, unspecified: Secondary | ICD-10-CM | POA: Diagnosis not present

## 2016-03-13 DIAGNOSIS — E039 Hypothyroidism, unspecified: Secondary | ICD-10-CM | POA: Diagnosis present

## 2016-03-13 LAB — URINALYSIS, ROUTINE W REFLEX MICROSCOPIC
BILIRUBIN URINE: NEGATIVE
GLUCOSE, UA: NEGATIVE mg/dL
Hgb urine dipstick: NEGATIVE
KETONES UR: NEGATIVE mg/dL
LEUKOCYTES UA: NEGATIVE
Nitrite: NEGATIVE
PH: 6.5 (ref 5.0–8.0)
Protein, ur: NEGATIVE mg/dL
Specific Gravity, Urine: 1.015 (ref 1.005–1.030)

## 2016-03-13 LAB — CBC
HEMATOCRIT: 37 % (ref 36.0–46.0)
Hemoglobin: 12.5 g/dL (ref 12.0–15.0)
MCH: 29.8 pg (ref 26.0–34.0)
MCHC: 33.8 g/dL (ref 30.0–36.0)
MCV: 88.1 fL (ref 78.0–100.0)
PLATELETS: 261 10*3/uL (ref 150–400)
RBC: 4.2 MIL/uL (ref 3.87–5.11)
RDW: 13.4 % (ref 11.5–15.5)
WBC: 7.6 10*3/uL (ref 4.0–10.5)

## 2016-03-13 LAB — DIFFERENTIAL
BASOS ABS: 0 10*3/uL (ref 0.0–0.1)
BASOS PCT: 0 %
EOS ABS: 0.1 10*3/uL (ref 0.0–0.7)
Eosinophils Relative: 1 %
Lymphocytes Relative: 25 %
Lymphs Abs: 1.9 10*3/uL (ref 0.7–4.0)
MONO ABS: 0.7 10*3/uL (ref 0.1–1.0)
MONOS PCT: 10 %
Neutro Abs: 4.9 10*3/uL (ref 1.7–7.7)
Neutrophils Relative %: 64 %

## 2016-03-13 LAB — COMPREHENSIVE METABOLIC PANEL
ALT: 28 U/L (ref 14–54)
AST: 29 U/L (ref 15–41)
Albumin: 4.2 g/dL (ref 3.5–5.0)
Alkaline Phosphatase: 70 U/L (ref 38–126)
Anion gap: 7 (ref 5–15)
BUN: 13 mg/dL (ref 6–20)
CHLORIDE: 101 mmol/L (ref 101–111)
CO2: 27 mmol/L (ref 22–32)
Calcium: 9.2 mg/dL (ref 8.9–10.3)
Creatinine, Ser: 0.87 mg/dL (ref 0.44–1.00)
Glucose, Bld: 117 mg/dL — ABNORMAL HIGH (ref 65–99)
POTASSIUM: 4.8 mmol/L (ref 3.5–5.1)
Sodium: 135 mmol/L (ref 135–145)
Total Bilirubin: 1.2 mg/dL (ref 0.3–1.2)
Total Protein: 8.1 g/dL (ref 6.5–8.1)

## 2016-03-13 LAB — BASIC METABOLIC PANEL
Anion gap: 7 (ref 5–15)
BUN: 10 mg/dL (ref 6–20)
CO2: 28 mmol/L (ref 22–32)
CREATININE: 0.84 mg/dL (ref 0.44–1.00)
Calcium: 8.9 mg/dL (ref 8.9–10.3)
Chloride: 101 mmol/L (ref 101–111)
GFR calc Af Amer: >60 mL/min (ref >60)
GLUCOSE: 127 mg/dL — AB (ref 65–99)
POTASSIUM: 2.8 mmol/L — AB (ref 3.5–5.1)
SODIUM: 136 mmol/L (ref 135–145)

## 2016-03-13 LAB — I-STAT CHEM 8, ED
BUN: 16 mg/dL (ref 6–20)
CALCIUM ION: 1.03 mmol/L — AB (ref 1.15–1.40)
CREATININE: 0.9 mg/dL (ref 0.44–1.00)
Chloride: 99 mmol/L — ABNORMAL LOW (ref 101–111)
Glucose, Bld: 115 mg/dL — ABNORMAL HIGH (ref 65–99)
HEMATOCRIT: 40 % (ref 36.0–46.0)
HEMOGLOBIN: 13.6 g/dL (ref 12.0–15.0)
Potassium: 6 mmol/L — ABNORMAL HIGH (ref 3.5–5.1)
Sodium: 137 mmol/L (ref 135–145)
TCO2: 31 mmol/L (ref 0–100)

## 2016-03-13 LAB — RAPID URINE DRUG SCREEN, HOSP PERFORMED
AMPHETAMINES: NOT DETECTED
BENZODIAZEPINES: NOT DETECTED
Barbiturates: NOT DETECTED
COCAINE: NOT DETECTED
OPIATES: NOT DETECTED
TETRAHYDROCANNABINOL: NOT DETECTED

## 2016-03-13 LAB — TROPONIN I

## 2016-03-13 LAB — I-STAT TROPONIN, ED: Troponin i, poc: 0 ng/mL (ref 0.00–0.08)

## 2016-03-13 LAB — ETHANOL

## 2016-03-13 LAB — LIPASE, BLOOD: Lipase: 29 U/L (ref 11–51)

## 2016-03-13 MED ORDER — ASPIRIN 325 MG PO TABS
325.0000 mg | ORAL_TABLET | Freq: Every day | ORAL | Status: DC
  Administered 2016-03-14 – 2016-03-15 (×2): 325 mg via ORAL
  Filled 2016-03-13 (×2): qty 1

## 2016-03-13 MED ORDER — HYDROCODONE-ACETAMINOPHEN 5-325 MG PO TABS
1.0000 | ORAL_TABLET | Freq: Once | ORAL | Status: AC
  Administered 2016-03-13: 1 via ORAL
  Filled 2016-03-13: qty 1

## 2016-03-13 MED ORDER — ACETAMINOPHEN 325 MG PO TABS: 650.0000 mg | ORAL_TABLET | Freq: Four times a day (QID) | ORAL | Status: DC | PRN

## 2016-03-13 MED ORDER — CHLORTHALIDONE 25 MG PO TABS
25.0000 mg | ORAL_TABLET | Freq: Every day | ORAL | Status: DC
  Administered 2016-03-13 – 2016-03-15 (×3): 25 mg via ORAL
  Filled 2016-03-13 (×3): qty 1

## 2016-03-13 MED ORDER — CYCLOSPORINE 0.05 % OP EMUL
1.0000 [drp] | Freq: Two times a day (BID) | OPHTHALMIC | Status: DC
  Administered 2016-03-13 – 2016-03-15 (×5): 1 [drp] via OPHTHALMIC
  Filled 2016-03-13 (×6): qty 1

## 2016-03-13 MED ORDER — ASPIRIN 81 MG PO CHEW
324.0000 mg | CHEWABLE_TABLET | Freq: Once | ORAL | Status: AC
  Administered 2016-03-13: 324 mg via ORAL
  Filled 2016-03-13: qty 4

## 2016-03-13 MED ORDER — HYDROCODONE-ACETAMINOPHEN 5-325 MG PO TABS
1.0000 | ORAL_TABLET | ORAL | Status: DC | PRN
  Administered 2016-03-13 – 2016-03-14 (×2): 2 via ORAL
  Filled 2016-03-13 (×2): qty 2

## 2016-03-13 MED ORDER — ATORVASTATIN CALCIUM 20 MG PO TABS
20.0000 mg | ORAL_TABLET | Freq: Every evening | ORAL | Status: DC
  Administered 2016-03-13 – 2016-03-14 (×2): 20 mg via ORAL
  Filled 2016-03-13: qty 2
  Filled 2016-03-13 (×2): qty 1
  Filled 2016-03-13: qty 2
  Filled 2016-03-13: qty 1

## 2016-03-13 MED ORDER — LOSARTAN POTASSIUM 50 MG PO TABS
100.0000 mg | ORAL_TABLET | Freq: Every day | ORAL | Status: DC
  Administered 2016-03-14 – 2016-03-15 (×2): 100 mg via ORAL
  Filled 2016-03-13 (×2): qty 2

## 2016-03-13 MED ORDER — ENOXAPARIN SODIUM 40 MG/0.4ML ~~LOC~~ SOLN
40.0000 mg | SUBCUTANEOUS | Status: DC
  Filled 2016-03-13: qty 0.4

## 2016-03-13 MED ORDER — NITROGLYCERIN 0.4 MG SL SUBL: 0.4000 mg | SUBLINGUAL_TABLET | SUBLINGUAL | Status: DC | PRN

## 2016-03-13 MED ORDER — SPIRONOLACTONE 25 MG PO TABS
25.0000 mg | ORAL_TABLET | Freq: Two times a day (BID) | ORAL | Status: DC
  Administered 2016-03-13 – 2016-03-15 (×4): 25 mg via ORAL
  Filled 2016-03-13 (×4): qty 1

## 2016-03-13 MED ORDER — ONDANSETRON 4 MG PO TBDP
4.0000 mg | ORAL_TABLET | Freq: Three times a day (TID) | ORAL | Status: DC | PRN
  Administered 2016-03-13 – 2016-03-14 (×2): 4 mg via ORAL
  Filled 2016-03-13 (×2): qty 1

## 2016-03-13 MED ORDER — ISOSORBIDE MONONITRATE ER 30 MG PO TB24
30.0000 mg | ORAL_TABLET | Freq: Every day | ORAL | Status: DC
  Administered 2016-03-13 – 2016-03-15 (×3): 30 mg via ORAL
  Filled 2016-03-13 (×3): qty 1

## 2016-03-13 MED ORDER — FUROSEMIDE 40 MG PO TABS
40.0000 mg | ORAL_TABLET | Freq: Every day | ORAL | Status: DC
  Administered 2016-03-13 – 2016-03-14 (×2): 40 mg via ORAL
  Filled 2016-03-13 (×2): qty 1

## 2016-03-13 MED ORDER — AMLODIPINE BESYLATE 10 MG PO TABS
10.0000 mg | ORAL_TABLET | Freq: Every day | ORAL | Status: DC
  Administered 2016-03-14 – 2016-03-15 (×2): 10 mg via ORAL
  Filled 2016-03-13 (×2): qty 1

## 2016-03-13 MED ORDER — ENOXAPARIN SODIUM 40 MG/0.4ML ~~LOC~~ SOLN
40.0000 mg | SUBCUTANEOUS | Status: DC
  Administered 2016-03-13 – 2016-03-15 (×2): 40 mg via SUBCUTANEOUS
  Filled 2016-03-13: qty 0.4

## 2016-03-13 MED ORDER — LOSARTAN POTASSIUM 50 MG PO TABS: 100.0000 mg | ORAL_TABLET | Freq: Every day | ORAL | Status: DC

## 2016-03-13 MED ORDER — UMECLIDINIUM BROMIDE 62.5 MCG/INH IN AEPB
1.0000 | INHALATION_SPRAY | Freq: Every day | RESPIRATORY_TRACT | Status: DC
  Administered 2016-03-14 – 2016-03-15 (×2): 1 via RESPIRATORY_TRACT
  Filled 2016-03-13: qty 7

## 2016-03-13 MED ORDER — AMLODIPINE BESYLATE 5 MG PO TABS: 10.0000 mg | ORAL_TABLET | Freq: Every day | ORAL | Status: DC

## 2016-03-13 MED ORDER — ASPIRIN 300 MG RE SUPP: 300.0000 mg | Freq: Every day | RECTAL | Status: DC

## 2016-03-13 MED ORDER — UMECLIDINIUM-VILANTEROL 62.5-25 MCG/INH IN AEPB
1.0000 | INHALATION_SPRAY | Freq: Every day | RESPIRATORY_TRACT | Status: DC
  Filled 2016-03-13: qty 14

## 2016-03-13 MED ORDER — ALBUTEROL SULFATE (2.5 MG/3ML) 0.083% IN NEBU: 2.5000 mg | INHALATION_SOLUTION | RESPIRATORY_TRACT | Status: DC | PRN

## 2016-03-13 MED ORDER — SENNOSIDES-DOCUSATE SODIUM 8.6-50 MG PO TABS: 1.0000 | ORAL_TABLET | Freq: Every evening | ORAL | Status: DC | PRN

## 2016-03-13 MED ORDER — ARFORMOTEROL TARTRATE 15 MCG/2ML IN NEBU
15.0000 ug | INHALATION_SOLUTION | Freq: Every day | RESPIRATORY_TRACT | Status: DC
  Administered 2016-03-14 – 2016-03-15 (×2): 15 ug via RESPIRATORY_TRACT
  Filled 2016-03-13 (×3): qty 2

## 2016-03-13 MED ORDER — STROKE: EARLY STAGES OF RECOVERY BOOK
Freq: Once | Status: AC
  Administered 2016-03-13: 23:00:00
  Filled 2016-03-13 (×2): qty 1

## 2016-03-13 NOTE — ED Notes (Signed)
[facility] arrived and is at bedside to transport patient.

## 2016-03-13 NOTE — ED Notes (Signed)
Nurse and phelb tech have tried without success for labwork. Will inform EDP

## 2016-03-13 NOTE — ED Notes (Addendum)
Dr. Christy Gentles attempted left EJ with myself at bedside with no success

## 2016-03-13 NOTE — H&P (Signed)
History and Physical    Hannah Cooper [id number] DOB: [birth year] DOA: 03/13/2016  PCP: Maggie Font, MD   Patient coming from: Home  Chief Complaint: Left arm and leg weakness and numbness, chest pain and palpitations  HPI: Hannah Cooper is a 60 y.o. female with medical history significant for chronic diastolic CHF, COPD, hypertension, and hyperlipidemia who presents to the emergency department with several days of left arm and left leg numbness and weakness with intermittent chest pains and palpitations. Patient reports that she had been in her usual state of health until approximately one week ago when she began to note some intermittent palpitations. She was also having some chest pain, but describes this as a chronic problem. Around that same time, she noted the insidious development of numbness and weakness involving the left arm and left leg, but sparing the left face. She denies any recent fall or trauma and denies any headache or change in vision or hearing. She had never experienced similar symptoms previously. Initially, she thought that the numbness and weakness would resolve, but reports that it has slowly worsened. She also notes subjective fevers and a mild sore throat for the past couple days, but denies any rhinorrhea, cough, or sick contacts.   ED Course: Upon arrival to the ED, patient is found to have temp of 37.7 C, be saturating well on room air, hypertensive to 160/106, and with vitals otherwise stable. EKG features a sinus rhythm with early R transition and flattening of the T waves in lateral leads which appears improved from prior. Chest x-ray is negative for acute cardiopulmonary disease and head CT is negative for acute intracranial abnormality. Chemistry panel is unremarkable, ethanol level is undetectable, and CBC is entirely within normal limits. Troponin is also undetectable. Patient was treated with a 324 mg aspirin and remained hemodynamically stable in the  ED with no apparent respiratory distress. There is concern for CVA as etiology for the patient's focal weakness and reported palpitations suggest a possible cardioembolic source. She will be admitted to the telemetry of emesis, hospital for further evaluation and management of this.  Review of Systems:  All other systems reviewed and apart from HPI, are negative.  Past Medical History:  Diagnosis Date  . Arthritis   . CHF (congestive heart failure) ([place])   . COPD (chronic obstructive pulmonary disease) ([place])   . Hyperlipidemia   . Hypertension   . Meningitis     Past Surgical History:  Procedure Laterality Date  . ABDOMINAL HYSTERECTOMY    . LEFT HEART CATHETERIZATION WITH CORONARY ANGIOGRAM N/A 09/05/2012   Procedure: LEFT HEART CATHETERIZATION WITH CORONARY ANGIOGRAM;  Surgeon: Wellington Hampshire, MD;  Location: [facility];  Service: Cardiovascular;  Laterality: N/A;  . THYROID SURGERY  2002     reports that she has never smoked. She has never used smokeless tobacco. She reports that she does not drink alcohol or use drugs.  Allergies  Allergen Reactions  . Other Swelling    [place] lipstick    Family History  Problem Relation Age of Onset  . Heart attack Mother 24  . Heart attack Sister 6     Prior to Admission medications   Medication Sig Start Date End Date Taking? Authorizing Provider  acetaminophen (TYLENOL) 500 MG tablet Take 1,000 mg by mouth every 6 (six) hours as needed.    Historical Provider, MD  albuterol (PROVENTIL) (2.5 MG/3ML) 0.083% nebulizer solution Take 2.5 mg by nebulization every 6 (six) hours as  needed for wheezing or shortness of breath.    Historical Provider, MD  amLODipine (NORVASC) 10 MG tablet Take 1 tablet (10 mg total) by mouth daily. 06/17/15   Arnoldo Lenis, MD  ANORO ELLIPTA 62.5-25 MCG/INH AEPB Inhale 1 puff into the lungs daily. 05/27/14   Historical Provider, MD  atorvastatin (LIPITOR) 20 MG tablet Take 20 mg by mouth every evening.      Historical Provider, MD  chlorthalidone (HYGROTON) 25 MG tablet Take 1 tablet (25 mg total) by mouth daily. 10/06/15   Arnoldo Lenis, MD  cycloSPORINE (RESTASIS) 0.05 % ophthalmic emulsion Place 1 drop into both eyes 2 (two) times daily.     Historical Provider, MD  furosemide (LASIX) 40 MG tablet Take 1 tablet (40 mg total) by mouth daily. 08/20/14   Imogene Burn, PA-C  HYDROcodone-acetaminophen (NORCO/VICODIN) 5-325 MG per tablet Take 1 tablet by mouth every 4 (four) hours as needed for moderate pain. 01/05/15   Samuella Cota, MD  ibuprofen (ADVIL,MOTRIN) 800 MG tablet Take 1 tablet by mouth 2 (two) times daily as needed (BACK PAIN).  08/01/14   Historical Provider, MD  isosorbide mononitrate (IMDUR) 30 MG 24 hr tablet TAKE 1 TABLET (30 MG TOTAL) BY MOUTH DAILY. 03/07/16   Arnoldo Lenis, MD  losartan (COZAAR) 100 MG tablet TAKE ONE TABLET BY MOUTH DAILY FOR BLOOD PRESSURE 03/11/15   Historical Provider, MD  meloxicam (MOBIC) 7.5 MG tablet Take 7.5 mg by mouth 2 (two) times daily. 03/10/15   Historical Provider, MD  nitroGLYCERIN (NITROSTAT) 0.4 MG SL tablet Place 1 tablet (0.4 mg total) under the tongue every 5 (five) minutes as needed for chest pain. 09/09/15   Imogene Burn, PA-C  potassium chloride (MICRO-K) 10 MEQ CR capsule TAKE ONE CAPSULE BY MOUTH EVERY DAY AS NEEDED 12/29/15   Arnoldo Lenis, MD  potassium chloride SA (KLOR-CON M20) 20 MEQ tablet Take 1 tablet (20 mEq total) by mouth daily. Patient taking differently: Take 20 mEq by mouth daily as needed (Cramps).  03/09/15   Lendon Colonel, NP  predniSONE (DELTASONE) 10 MG tablet Take 2 tablets (20 mg total) by mouth daily. 09/06/15   Nat Christen, MD  PROAIR RESPICLICK [id number] (90 BASE) MCG/ACT AEPB USE 1-2 PUFFS INTO LUNGS 4 TIMES A DAY AS NEEDED FOR SHORTNESS OF BREATH 12/18/14   Historical Provider, MD  spironolactone (ALDACTONE) 25 MG tablet Take 1 tablet (25 mg total) by mouth 2 (two) times daily. 10/19/15   Lendon Colonel,  NP    Physical Exam: Vitals:   03/13/16 0010  BP: (!) 160/106  Pulse: 93  Resp: 21  Temp: 99.9 F (37.7 C)  TempSrc: Oral  SpO2: 95%  Weight: 99.3 kg (219 lb)  Height: 5\' 4"  (1.626 m)      Constitutional: NAD, calm, comfortable Eyes: PERTLA, lids and conjunctivae normal ENMT: Mucous membranes are moist. Posterior pharynx clear of any exudate or lesions.   Neck: normal, supple, no masses, no thyromegaly Respiratory: clear to auscultation bilaterally, no wheezing, no crackles. Normal respiratory effort.    Cardiovascular: S1 & S2 heard, regular rate and rhythm. No carotid bruits. No significant JVD. Abdomen: No distension, no tenderness, no masses palpated. Bowel sounds normal.  Musculoskeletal: no clubbing / cyanosis. No joint deformity upper and lower extremities. Normal muscle tone.  Skin: no significant rashes, lesions, ulcers. Warm, dry, well-perfused. Neurologic: CN 2-12 grossly intact. Sensation to light touch intact throughout. Strength 3-4/5 throughout LUE and LLE, otherwise  5/5.   Psychiatric: Normal judgment and insight. Alert and oriented x 3. Normal mood and affect.     Labs on Admission: I have personally reviewed following labs and imaging studies  CBC:  Recent Labs Lab 03/13/16 0357 03/13/16 0405  WBC 7.6  --   NEUTROABS 4.9  --   HGB 12.5 13.6  HCT 37.0 40.0  MCV 88.1  --   PLT 261  --    Basic Metabolic Panel:  Recent Labs Lab 03/13/16 0357 03/13/16 0405  NA 135 137  K 4.8 6.0*  CL 101 99*  CO2 27  --   GLUCOSE 117* 115*  BUN 13 16  CREATININE 0.87 0.90  CALCIUM 9.2  --    GFR: Estimated Creatinine Clearance: 75.1 mL/min (by C-G formula based on SCr of 0.9 mg/dL). Liver Function Tests:  Recent Labs Lab 03/13/16 0357  AST 29  ALT 28  ALKPHOS 70  BILITOT 1.2  PROT 8.1  ALBUMIN 4.2    Recent Labs Lab 03/13/16 0357  LIPASE 29   No results for input(s): AMMONIA in the last 168 hours. Coagulation Profile: No results for  input(s): INR, PROTIME in the last 168 hours. Cardiac Enzymes:  Recent Labs Lab 03/13/16 0357  TROPONINI <0.03   BNP (last 3 results) No results for input(s): PROBNP in the last 8760 hours. HbA1C: No results for input(s): HGBA1C in the last 72 hours. CBG: No results for input(s): GLUCAP in the last 168 hours. Lipid Profile: No results for input(s): CHOL, HDL, LDLCALC, TRIG, CHOLHDL, LDLDIRECT in the last 72 hours. Thyroid Function Tests: No results for input(s): TSH, T4TOTAL, FREET4, T3FREE, THYROIDAB in the last 72 hours. Anemia Panel: No results for input(s): VITAMINB12, FOLATE, FERRITIN, TIBC, IRON, RETICCTPCT in the last 72 hours. Urine analysis:    Component Value Date/Time   COLORURINE YELLOW 03/21/2015 1800   APPEARANCEUR CLEAR 03/21/2015 1800   LABSPEC 1.015 03/21/2015 1800   PHURINE 8.0 03/21/2015 1800   GLUCOSEU NEGATIVE 03/21/2015 1800   HGBUR NEGATIVE 03/21/2015 1800   BILIRUBINUR NEGATIVE 03/21/2015 1800   KETONESUR NEGATIVE 03/21/2015 1800   PROTEINUR NEGATIVE 03/21/2015 1800   UROBILINOGEN 0.2 04/16/2012 0920   NITRITE NEGATIVE 03/21/2015 1800   LEUKOCYTESUR NEGATIVE 03/21/2015 1800   Sepsis Labs: @LABRCNTIP (procalcitonin:4,lacticidven:4) )No results found for this or any previous visit (from the past 240 hour(s)).   Radiological Exams on Admission: Dg Chest 2 View  Result Date: 03/13/2016 CLINICAL DATA:  Mid central chest pain. Heart fluttering. Left-sided numbness, shortness of breath, sore throat, nonproductive cough, and fever. History of COPD, congestive heart failure, hypertension. EXAM: CHEST  2 VIEW COMPARISON:  04/26/2015 FINDINGS: Slightly shallow inspiration. Linear fibrosis or atelectasis in the lung bases similar to previous study. No focal airspace disease or consolidation in the lungs. No blunting of costophrenic angles. No pneumothorax. Mediastinal contours appear intact. Tortuous aorta. Degenerative changes in the spine and shoulders.  IMPRESSION: Slightly shallow inspiration with mild linear atelectasis or fibrosis in the lung bases, similar to prior study. No evidence of active pulmonary disease. Electronically Signed   By: Lucienne Capers M.D.   On: 03/13/2016 01:29   Ct Head Wo Contrast  Result Date: 03/13/2016 CLINICAL DATA:  Left arm weakness. Nausea, chest pain, shortness of breath, abdominal pain, cough, sore throat, fever, and low back pain. EXAM: CT HEAD WITHOUT CONTRAST TECHNIQUE: Contiguous axial images were obtained from the base of the skull through the vertex without intravenous contrast. COMPARISON:  04/26/2015 FINDINGS: Brain: No evidence of  acute infarction, hemorrhage, hydrocephalus, extra-axial collection or mass lesion/mass effect. Vascular: No hyperdense vessel or unexpected calcification. Skull: Normal. Negative for fracture or focal lesion. Sinuses/Orbits: No acute finding. Other: None. IMPRESSION: No acute intracranial abnormalities. Electronically Signed   By: Lucienne Capers M.D.   On: 03/13/2016 01:28    EKG: Independently reviewed. Sinus rhythm, early R transition, lateral T-wave flattening appears improved from prior  Assessment/Plan  1. Left-sided weakness - Pt reports sxs developed ~1 wk prior to admission; primary concern is for CVA  - Head CT negative for acute intracranial abnormality; tPA not considered d/t presentation outside the appropriate timeframe - Given lack of [facility] at [facility] today, patient will be admitted to [facility] on telemetry, obtain MRI/MRA brain, TTE, carotid dopplers, fasting lipid panel, A1c  - Keep NPO pending swallow eval, frequent neuro checks, PT/OT evaluations requested  - Continue ASA ppx, VTE ppx, maintain euglycemia, normothermia, and euvolemia   2. Chest pain, palpitations  - Chest pain has been chronic and coronary angiography revealed normal coronaries in May 2014  - Initial troponin is undetectable and EKG features flattened T-waves  in lateral leads, but appears less prominent than on prior EKG - She was given ASA 324 mg in ED  - Plan to monitor on telemetry, obtain serial troponin measurements, TTE as above  - Continue Imdur, losartan, Lipitor, and ASA as tolerated   3. Chronic diastolic CHF  - Appears euvolemic on presentation  - TTE (07/05/13) with EF 0000000, diastolic dysfunction (ungraded), and mild-moderate LVH  - Continue Lasix, Aldactone, losartan as tolerated  - Follow I/Os, daily wts; SLIV    4. COPD - Stable on admission with no suggestion of exacerbation  - Continue scheduled Anoro Ellipta and prn albuterol    5. Hypertension  - BP elevated in ED  - Continue losartan, Norvasc, chlorthalidone as tolerated    DVT prophylaxis: sq Lovenox  Code Status: Full  Family Communication: Husband updated at bedside Disposition Plan: Observe on telemetry Consults called: None Admission status: Observation    Vianne Bulls, MD [facility] Pager [phone]  If 7PM-7AM, please contact night-coverage [email or web address] Password [facility]  03/13/2016, 5:21 AM

## 2016-03-13 NOTE — Progress Notes (Signed)
Patient seen and examined this morning, admitted earlier today by Dr. Myna Hidalgo, H&P reviewed and agree with the assessment and plan  In brief, this is 59 year old female with history of diastolic CHF, COPD, hypertension, hyperlipidemia, who came in with several days of left-sided weakness, left upper and lower extremities  Left-sided weakness - Pt reports sxs developed ~1 wk prior to admission; primary concern is for CVA  - Head CT negative for acute intracranial abnormality; tPA not considered d/t presentation outside the appropriate timeframe - MRI of the brain was negative, despite patient's persistent symptoms. Consulted neurology, appreciate input  Chest pain, palpitations  - Chest pain has been chronic and coronary angiography revealed normal coronaries in May 2014  - Initial troponin is undetectable and EKG features flattened T-waves in lateral leads, but appears less prominent than on prior EKG - She was given ASA 324 mg in ED  - Plan to monitor on telemetry, 2D echo  Chronic diastolic CHF  - Appears euvolemic on presentation   COPD - Stable on admission with no suggestion of exacerbation  - Continue scheduled Anoro Ellipta and prn albuterol    Hypertension  - permissive HTN  Physical therapy evaluation pending  Chane Cowden M. Cruzita Lederer, MD [facility] [phone]

## 2016-03-13 NOTE — ED Notes (Signed)
Report given to Percival Spanish, RN at [facility]. All questions answered

## 2016-03-13 NOTE — ED Provider Notes (Signed)
[facility] DEPT Provider Note   CSN: [id number] Arrival date & time: 03/12/16  2357  By signing my name below, I, Hansel Feinstein, attest that this documentation has been prepared under the direction and in the presence of Ripley Fraise, MD. Electronically Signed: Hansel Feinstein, ED Scribe. 03/13/16. 12:40 AM.     History   Chief Complaint Chief Complaint  Patient presents with  . Chest Pain    HPI Hannah Cooper is a 59 y.o. female with h/o CHF, COPD, HTN, HLD who presents to the Emergency Department complaining of moderate palpitations onset this week. Pt additionally complains of nausea, CP, SOB, abdominal pain, cough, sore throat, fever, lower back pain. Pt also complains of intermittent numbness in the left hand and left foot. Pt also states her left arm and left leg have intermittently felt "tired" for the last week and she has had difficulty holding the arm up. No h/o stroke. Pt states her legs have been more swollen this week, but she has been taking increased doses of her diuretic with relief of the swelling. Pt states her current back pain is similar to chronic pain secondary to arthritis. She denies vomiting. No recent travel outside the country. Pt is a non-smoker.    PCP- Maggie Font, MD    The history is provided by the patient. No language interpreter was used.  Chest Pain   This is a new problem. The current episode started more than 2 days ago. The problem occurs constantly. The problem has not changed since onset.The pain is associated with rest. The pain is present in the substernal region. The pain is moderate. The pain radiates to the epigastrium. Associated symptoms include abdominal pain, back pain, cough, a fever, nausea, numbness, palpitations, shortness of breath and weakness. Pertinent negatives include no vomiting. She has tried nothing for the symptoms. The treatment provided no relief.  Her past medical history is significant for COPD, CHF, hyperlipidemia  and hypertension.    Past Medical History:  Diagnosis Date  . Arthritis   . CHF (congestive heart failure) ([place])   . COPD (chronic obstructive pulmonary disease) ([place])   . Hyperlipidemia   . Hypertension   . Meningitis     Patient Active Problem List   Diagnosis Date Noted  . COPD (chronic obstructive pulmonary disease) ([place]) 08/20/2014  . Dyspnea 12/19/2013  . Chronic diastolic CHF (congestive heart failure) ([place]) 07/05/2013  . Lower extremity edema 07/05/2013  . Hyperlipidemia   . Chest pain 07/04/2013  . Hypokalemia 09/04/2012  . Precordial pain 09/04/2012  . Viral meningitis 01/22/2011  . Vomiting 01/22/2011  . PNEUMONIA, LEFT LOWER LOBE 10/10/2006  . DISEASE, ACUTE BRONCHOSPASM 10/10/2006  . Hypothyroidism 05/23/2006  . Essential hypertension 05/23/2006  . OSTEOARTHRITIS 05/23/2006    Past Surgical History:  Procedure Laterality Date  . ABDOMINAL HYSTERECTOMY    . LEFT HEART CATHETERIZATION WITH CORONARY ANGIOGRAM N/A 09/05/2012   Procedure: LEFT HEART CATHETERIZATION WITH CORONARY ANGIOGRAM;  Surgeon: Wellington Hampshire, MD;  Location: [facility];  Service: Cardiovascular;  Laterality: N/A;  . THYROID SURGERY  2002    OB History    Gravida Para Term Preterm AB Living             0   SAB TAB Ectopic Multiple Live Births                   Home Medications    Prior to Admission medications   Medication Sig Start Date End Date  Taking? Authorizing Provider  acetaminophen (TYLENOL) 500 MG tablet Take 1,000 mg by mouth every 6 (six) hours as needed.    Historical Provider, MD  albuterol (PROVENTIL) (2.5 MG/3ML) 0.083% nebulizer solution Take 2.5 mg by nebulization every 6 (six) hours as needed for wheezing or shortness of breath.    Historical Provider, MD  amLODipine (NORVASC) 10 MG tablet Take 1 tablet (10 mg total) by mouth daily. 06/17/15   Arnoldo Lenis, MD  ANORO ELLIPTA 62.5-25 MCG/INH AEPB Inhale 1 puff into the lungs daily. 05/27/14   Historical Provider,  MD  atorvastatin (LIPITOR) 20 MG tablet Take 20 mg by mouth every evening.     Historical Provider, MD  chlorthalidone (HYGROTON) 25 MG tablet Take 1 tablet (25 mg total) by mouth daily. 10/06/15   Arnoldo Lenis, MD  cycloSPORINE (RESTASIS) 0.05 % ophthalmic emulsion Place 1 drop into both eyes 2 (two) times daily.     Historical Provider, MD  furosemide (LASIX) 40 MG tablet Take 1 tablet (40 mg total) by mouth daily. 08/20/14   Imogene Burn, PA-C  HYDROcodone-acetaminophen (NORCO/VICODIN) 5-325 MG per tablet Take 1 tablet by mouth every 4 (four) hours as needed for moderate pain. 01/05/15   Samuella Cota, MD  ibuprofen (ADVIL,MOTRIN) 800 MG tablet Take 1 tablet by mouth 2 (two) times daily as needed (BACK PAIN).  08/01/14   Historical Provider, MD  isosorbide mononitrate (IMDUR) 30 MG 24 hr tablet TAKE 1 TABLET (30 MG TOTAL) BY MOUTH DAILY. 03/07/16   Arnoldo Lenis, MD  losartan (COZAAR) 100 MG tablet TAKE ONE TABLET BY MOUTH DAILY FOR BLOOD PRESSURE 03/11/15   Historical Provider, MD  meloxicam (MOBIC) 7.5 MG tablet Take 7.5 mg by mouth 2 (two) times daily. 03/10/15   Historical Provider, MD  nitroGLYCERIN (NITROSTAT) 0.4 MG SL tablet Place 1 tablet (0.4 mg total) under the tongue every 5 (five) minutes as needed for chest pain. 09/09/15   Imogene Burn, PA-C  potassium chloride (MICRO-K) 10 MEQ CR capsule TAKE ONE CAPSULE BY MOUTH EVERY DAY AS NEEDED 12/29/15   Arnoldo Lenis, MD  potassium chloride SA (KLOR-CON M20) 20 MEQ tablet Take 1 tablet (20 mEq total) by mouth daily. Patient taking differently: Take 20 mEq by mouth daily as needed ([place]ram[place]).  03/09/15   Lendon Colonel, NP  predniSONE (DELTASONE) 10 MG tablet Take 2 tablets (20 mg total) by mouth daily. 09/06/15   Nat Christen, MD  PROAIR RESPICLICK [id number] (90 BASE) MCG/ACT AEPB USE 1-2 PUFFS INTO LUNGS 4 TIMES A DAY AS NEEDED FOR SHORTNESS OF BREATH 12/18/14   Historical Provider, MD  spironolactone (ALDACTONE) 25 MG tablet Take 1  tablet (25 mg total) by mouth 2 (two) times daily. 10/19/15   Lendon Colonel, NP    Family History Family History  Problem Relation Age of Onset  . Heart attack Mother 68  . Heart attack Sister 14    Social History Social History  Substance Use Topics  . Smoking status: Never Smoker  . Smokeless tobacco: Never Used  . Alcohol use No     Allergies   Other   Review of Systems Review of Systems  Constitutional: Positive for fever.  HENT: Positive for sore throat.   Respiratory: Positive for cough and shortness of breath.   Cardiovascular: Positive for chest pain, palpitations and leg swelling.  Gastrointestinal: Positive for abdominal pain and nausea. Negative for vomiting.  Musculoskeletal: Positive for back pain.  Neurological: Positive for  weakness and numbness.  All other systems reviewed and are negative.    Physical Exam Updated Vital Signs BP (!) 160/106   Pulse 93   Temp 99.9 F (37.7 C) (Oral)   Resp 21   Ht 5\' 4"  (1.626 m)   Wt 219 lb (99.3 kg)   SpO2 95%   BMI 37.59 kg/m   Physical Exam CONSTITUTIONAL: Well developed/well nourished HEAD: Normocephalic/atraumatic EYES: EOMI/PERRL ENMT: Mucous membranes moist, uvula midline, no erythema or exudates.  NECK: supple no meningeal signs SPINE/BACK:entire spine nontender CV: S1/S2 noted, no murmurs/rubs/gallops noted LUNGS: Crackles in the left base.  ABDOMEN: soft, nontender, no rebound or guarding, bowel sounds noted throughout abdomen GU:no cva tenderness NEURO: Pt is awake/alert/appropriate, No facial droop. Mild drift noted to left arm and left leg. No sensory deficit noted.  EXTREMITIES: pulses normal/equal, full ROM SKIN: warm, color normal PSYCH: no abnormalities of mood noted, alert and oriented to situation   ED Treatments / Results   DIAGNOSTIC STUDIES: Oxygen Saturation is 95% on RA, adequate by my interpretation.    COORDINATION OF CARE: 12:36 AM Discussed treatment plan with pt at  bedside which includes CXR, lab work, CT head and pt agreed to plan.    Labs (all labs ordered are listed, but only abnormal results are displayed) Labs Reviewed  COMPREHENSIVE METABOLIC PANEL - Abnormal; Notable for the following:       Result Value   Glucose, Bld 117 (*)    All other components within normal limits  I-STAT CHEM 8, ED - Abnormal; Notable for the following:    Potassium 6.0 (*)    Chloride 99 (*)    Glucose, Bld 115 (*)    Calcium, Ion 1.03 (*)    All other components within normal limits  ETHANOL  CBC  DIFFERENTIAL  TROPONIN I  LIPASE, BLOOD  RAPID URINE DRUG SCREEN, HOSP PERFORMED  URINALYSIS, ROUTINE W REFLEX MICROSCOPIC (NOT AT [facility])  TROPONIN I  TROPONIN I  TROPONIN I  I-STAT TROPOININ, ED    EKG  EKG Interpretation  Date/Time:  Sunday March 13 2016 00:12:44 EST Ventricular Rate:  88 PR Interval:    QRS Duration: 74 QT Interval:  470 QTC Calculation: 569 R Axis:   2 Text Interpretation:  Sinus rhythm Abnormal R-wave progression, early transition Borderline T abnormalities, lateral leads No significant change since last tracing Confirmed by [place] ([id number]) on 03/13/2016 1:15:17 AM       Radiology Dg Chest 2 View  Result Date: 03/13/2016 CLINICAL DATA:  Mid central chest pain. Heart fluttering. Left-sided numbness, shortness of breath, sore throat, nonproductive cough, and fever. History of COPD, congestive heart failure, hypertension. EXAM: CHEST  2 VIEW COMPARISON:  04/26/2015 FINDINGS: Slightly shallow inspiration. Linear fibrosis or atelectasis in the lung bases similar to previous study. No focal airspace disease or consolidation in the lungs. No blunting of costophrenic angles. No pneumothorax. Mediastinal contours appear intact. Tortuous aorta. Degenerative changes in the spine and shoulders. IMPRESSION: Slightly shallow inspiration with mild linear atelectasis or fibrosis in the lung bases, similar to prior study. No evidence  of active pulmonary disease. Electronically Signed   By: Lucienne Capers M.D.   On: 03/13/2016 01:29   Ct Head Wo Contrast  Result Date: 03/13/2016 CLINICAL DATA:  Left arm weakness. Nausea, chest pain, shortness of breath, abdominal pain, cough, sore throat, fever, and low back pain. EXAM: CT HEAD WITHOUT CONTRAST TECHNIQUE: Contiguous axial images were obtained from the base of the  skull through the vertex without intravenous contrast. COMPARISON:  04/26/2015 FINDINGS: Brain: No evidence of acute infarction, hemorrhage, hydrocephalus, extra-axial collection or mass lesion/mass effect. Vascular: No hyperdense vessel or unexpected calcification. Skull: Normal. Negative for fracture or focal lesion. Sinuses/Orbits: No acute finding. Other: None. IMPRESSION: No acute intracranial abnormalities. Electronically Signed   By: Lucienne Capers M.D.   On: 03/13/2016 01:28    Procedures Procedures (including critical care time)  Medications Ordered in ED  Initial Impression / Assessment and Plan / ED Course  I have reviewed the triage vital signs and the nursing notes.  Pertinent labs  results that were available during my care of the patient were reviewed by me and considered in my medical decision making (see chart for details).  Clinical Course     tPA in stroke considered but not given due to: Onset over 3-4.5hours  2:46 AM Pt difficult IV access Multiple attempts by nursing/phlebotomy and no success and pt refused further attempts I attempted left EJ without success, pt began yelling and screaming during procedure Will check again soon to obtain labs Imaging negative thus far  5:32 AM Labs reassuring Plan to admit for stroke workup Left sided weakness has been present for several days, not an acute stroke As for her CP/palpitations, initial troponin negative D/w dr opyd for admit, likely  Transfer to  for stroke/neuro evaluation and MRI   Final Clinical Impressions(s) /  ED Diagnoses   Final diagnoses:  Cerebrovascular accident (CVA), unspecified mechanism ([place])  Palpitations  Left-sided weakness  Left-sided weakness  Left-sided weakness    New Prescriptions New Prescriptions   No medications on file    I personally performed the services described in this documentation, which was scribed in my presence. The recorded information has been reviewed and is accurate.        Ripley Fraise, MD 03/13/16 [phone]

## 2016-03-13 NOTE — ED Notes (Signed)
Report given to [facility] [place]

## 2016-03-13 NOTE — ED Notes (Signed)
Ron, RN able to obtain blood by IV ultrasound but unable to obtain peripherial IV. [place] aware, blood taken to lab and istats completed

## 2016-03-13 NOTE — Progress Notes (Signed)
New Admission Note:  Arrival Method: carelink  Mental Orientation: Alert & oriented x4  Telemetry: box 10  Assessment: Completed Skin: assessment complted IV: no iv access upon arival  Pain: 0/10 Safety Measures: Safety Fall Prevention Plan was given, discussed a. Admission: Completed 5c Orientation: Patient has been orientated to the room, unit and the staff. Family:visiting  Orders have been reviewed and implemented. Will continue to monitor the patient. Call light has been placed within reach and bed alarm has been activated.   Arta Silence ,RN

## 2016-03-13 NOTE — ED Triage Notes (Signed)
Pt c/o mid center chest pain, "heart fluttering" left side numbness that started a few days ago, sore throat and fever that started tonight,

## 2016-03-13 NOTE — ED Notes (Signed)
Robert from RT attempted arterial sample for lab work. Patient unable to tolerate any decent attempt at IV earlier, phelb or this arterial stick. Pt and husband assisted artier stick be stopped. Pt stating she wants "to be put to sleep" and then states she "wants to leave". Informed pt and husband that Christy Gentles will be in to speak with them about next step in care.

## 2016-03-13 NOTE — Consult Note (Signed)
NEURO HOSPITALIST CONSULT NOTE     Reason for Consult:Left-sided weakness and numbness    HPI:                                                                                                                                          Hannah Cooper is an 60 y.o. female  with medical history significant for chronic diastolic CHF, COPD, hypertension, and hyperlipidemia who presents to the emergency department with several days of left arm and left leg numbness and weakness with intermittent chest pains and palpitations. Patient's symptoms are significantly improved except she continues to complain of left toe numbness. MRI brain is negative for acute stroke. She denies any fever chills night sweats no change in bowel or urinary habits.  Past Medical History:  Diagnosis Date  . Arthritis   . CHF (congestive heart failure) ([place])   . COPD (chronic obstructive pulmonary disease) ([place])   . Hyperlipidemia   . Hypertension   . Meningitis     Past Surgical History:  Procedure Laterality Date  . ABDOMINAL HYSTERECTOMY    . LEFT HEART CATHETERIZATION WITH CORONARY ANGIOGRAM N/A 09/05/2012   Procedure: LEFT HEART CATHETERIZATION WITH CORONARY ANGIOGRAM;  Surgeon: Wellington Hampshire, MD;  Location: [facility] CATH [facility];  Service: Cardiovascular;  Laterality: N/A;  . THYROID SURGERY  2002    Family History  Problem Relation Age of Onset  . Heart attack Mother 56  . Heart attack Sister 86     Social History:  reports that she has never smoked. She has never used smokeless tobacco. She reports that she does not drink alcohol or use drugs.  Allergies  Allergen Reactions  . Other Swelling    [place] lipstick    MEDICATIONS:                                                                                                                     Scheduled: .  stroke: mapping our early stages of recovery book   Does not apply Once  . [START ON 03/14/2016] amLODipine  10 mg Oral Daily  .  arformoterol  15 mcg Nebulization Daily  . [START ON 03/14/2016] aspirin  300 mg Rectal Daily   Or  . [START ON 03/14/2016] aspirin  325 mg  Oral Daily  . atorvastatin  20 mg Oral QPM  . chlorthalidone  25 mg Oral Daily  . cycloSPORINE  1 drop Both Eyes BID  . enoxaparin (LOVENOX) injection  40 mg Subcutaneous Q24H  . furosemide  40 mg Oral Daily  . isosorbide mononitrate  30 mg Oral Daily  . [START ON 03/14/2016] losartan  100 mg Oral Daily  . spironolactone  25 mg Oral BID  . umeclidinium bromide  1 puff Inhalation Daily     ROS:                                                                                                                                    As mentioned in history of present illness   Blood pressure 129/76, pulse 65, temperature 98 F (36.7 C), temperature source Oral, resp. rate 18, height 5\' 4"  (1.626 m), weight 99.3 kg (219 lb), SpO2 98 %.  Examination Gen. Sitting in bed and appears comfortableNeck supple without any lymphadenopathyLungs clear to auscultationCVS S1-S2 regularPsychiatric normal inside Neurological exam alert and oriented 3 Motor exam did not reveal any focal weakness Cranial nerves II through XII intact Motor exam shows very mild giveaway weakness on the left upper and lower extremity with subjective feeling of numbness of left toes  Lab Results: Basic Metabolic Panel:  Recent Labs Lab 03/13/16 0357 03/13/16 0405 03/13/16 1451  NA 135 137 136  K 4.8 6.0* 2.8*  CL 101 99* 101  CO2 27  --  28  GLUCOSE 117* 115* 127*  BUN 13 16 10   CREATININE 0.87 0.90 0.84  CALCIUM 9.2  --  8.9    Liver Function Tests:  Recent Labs Lab 03/13/16 0357  AST 29  ALT 28  ALKPHOS 70  BILITOT 1.2  PROT 8.1  ALBUMIN 4.2    Recent Labs Lab 03/13/16 0357  LIPASE 29   No results for input(s): AMMONIA in the last 168 hours.  CBC:  Recent Labs Lab 03/13/16 0357 03/13/16 0405  WBC 7.6  --   NEUTROABS 4.9  --   HGB 12.5 13.6  HCT 37.0  40.0  MCV 88.1  --   PLT 261  --     Cardiac Enzymes:  Recent Labs Lab 03/13/16 0357  TROPONINI <0.03    Lipid Panel: No results for input(s): CHOL, TRIG, HDL, CHOLHDL, VLDL, LDLCALC in the last 168 hours.  CBG: No results for input(s): GLUCAP in the last 168 hours.  Microbiology: Results for orders placed or performed during the hospital encounter of 04/16/12  Urine culture     Status: None   Collection Time: 04/16/12  9:20 AM  Result Value Ref Range Status   Specimen Description URINE, CLEAN CATCH  Final   Special Requests NONE  Final   Culture  Setup Time 04/16/2012 10:30  Final   Colony Count NO GROWTH  Final   Culture NO GROWTH  Final  Report Status 04/17/2012 FINAL  Final    Coagulation Studies: No results for input(s): LABPROT, INR in the last 72 hours.  Imaging: Dg Chest 2 View  Result Date: 03/13/2016 CLINICAL DATA:  Mid central chest pain. Heart fluttering. Left-sided numbness, shortness of breath, sore throat, nonproductive cough, and fever. History of COPD, congestive heart failure, hypertension. EXAM: CHEST  2 VIEW COMPARISON:  04/26/2015 FINDINGS: Slightly shallow inspiration. Linear fibrosis or atelectasis in the lung bases similar to previous study. No focal airspace disease or consolidation in the lungs. No blunting of costophrenic angles. No pneumothorax. Mediastinal contours appear intact. Tortuous aorta. Degenerative changes in the spine and shoulders. IMPRESSION: Slightly shallow inspiration with mild linear atelectasis or fibrosis in the lung bases, similar to prior study. No evidence of active pulmonary disease. Electronically Signed   By: Lucienne Capers M.D.   On: 03/13/2016 01:29   Ct Head Wo Contrast  Result Date: 03/13/2016 CLINICAL DATA:  Left arm weakness. Nausea, chest pain, shortness of breath, abdominal pain, cough, sore throat, fever, and low back pain. EXAM: CT HEAD WITHOUT CONTRAST TECHNIQUE: Contiguous axial images were obtained from  the base of the skull through the vertex without intravenous contrast. COMPARISON:  04/26/2015 FINDINGS: Brain: No evidence of acute infarction, hemorrhage, hydrocephalus, extra-axial collection or mass lesion/mass effect. Vascular: No hyperdense vessel or unexpected calcification. Skull: Normal. Negative for fracture or focal lesion. Sinuses/Orbits: No acute finding. Other: None. IMPRESSION: No acute intracranial abnormalities. Electronically Signed   By: Lucienne Capers M.D.   On: 03/13/2016 01:28   Mr Brain Wo Contrast  Result Date: 03/13/2016 CLINICAL DATA:  59 year old female with several days of left extremity weakness. Chest pain and palpitations. Hypertension. Initial encounter. EXAM: MRI HEAD WITHOUT CONTRAST MRA HEAD WITHOUT CONTRAST TECHNIQUE: Multiplanar, multiecho pulse sequences of the brain and surrounding structures were obtained without intravenous contrast. Angiographic images of the head were obtained using MRA technique without contrast. COMPARISON:  Head CT without contrast 0050 hours today. FINDINGS: MRI HEAD FINDINGS Brain: Cerebral volume is within normal limits for age. No restricted diffusion to suggest acute infarction. No midline shift, mass effect, evidence of mass lesion, ventriculomegaly, extra-axial collection or acute intracranial hemorrhage. Cervicomedullary junction and pituitary are within normal limits. Scattered small nonspecific foci of cerebral white matter T2 and FLAIR hyperintensity, primarily in the anterior frontal lobes and mostly subcortical. There is mild motion artifact, and T2 propeller (rapid acquisition (sequences had to be utilized. There is questionable mild asymmetric gyral thickening and T2 hyperintensity in the posterior right temporal lobe and right operculum as seen on series 11, image 12. However, this appearance is not correlated with any asymmetric diffusion weighted signal, and not definitely correlated with any FLAIR and T1 signal alteration in the  same areas. There is a small chronic micro hemorrhage in the posterior right temporal lobe (series 9, image 37). No cortical encephalomalacia or other chronic cerebral blood products identified. Deep gray matter nuclei, brainstem and cerebellum appear normal. Vascular: Major intracranial vascular flow voids appear normal. Skull and upper cervical spine: Negative. Visualized bone marrow signal is within normal limits. Sinuses/Orbits: Negative orbits soft tissues. Visualized paranasal sinuses and mastoids are stable and well pneumatized. Other: Visible internal auditory structures appear normal. Negative scalp soft tissues. MRA HEAD FINDINGS Antegrade flow in the posterior circulation. Fairly codominant distal vertebral arteries. Both PICA origins are patent. Patent vertebrobasilar junction. No distal vertebral stenosis. No basilar stenosis. Normal SCA and PCA origins, fetal type right PCA. Left posterior communicating artery is  present. Normal bilateral PCA branches. Antegrade flow in both ICA siphons. No siphon stenosis. Ophthalmic and posterior communicating artery origins appear normal. Normal carotid termini, MCA and ACA origins. The left ACA is mildly dominant. Mildly tortuous ACA A1 segments. Diminutive or absent anterior communicating artery. Visualized ACA branches are within normal limits. MCA M1 segments, MCA bifurcations, and visualized MCA branches are within normal limits. IMPRESSION: 1.  No acute intracranial abnormality. 2. Subtle T2 signal asymmetry in the posterior right temporal lobe is felt to be artifactual. There is mild for age nonspecific cerebral white matter signal changes, and rare chronic micro hemorrhages, which likely are chronic small vessel disease related. 3.  Negative intracranial MRA. Electronically Signed   By: Genevie Ann M.D.   On: 03/13/2016 11:15   Mr Jodene Nam Head/brain [id number] Cm  Result Date: 03/13/2016 CLINICAL DATA:  60 year old female with several days of left extremity weakness.  Chest pain and palpitations. Hypertension. Initial encounter. EXAM: MRI HEAD WITHOUT CONTRAST MRA HEAD WITHOUT CONTRAST TECHNIQUE: Multiplanar, multiecho pulse sequences of the brain and surrounding structures were obtained without intravenous contrast. Angiographic images of the head were obtained using MRA technique without contrast. COMPARISON:  Head CT without contrast 0050 hours today. FINDINGS: MRI HEAD FINDINGS Brain: Cerebral volume is within normal limits for age. No restricted diffusion to suggest acute infarction. No midline shift, mass effect, evidence of mass lesion, ventriculomegaly, extra-axial collection or acute intracranial hemorrhage. Cervicomedullary junction and pituitary are within normal limits. Scattered small nonspecific foci of cerebral white matter T2 and FLAIR hyperintensity, primarily in the anterior frontal lobes and mostly subcortical. There is mild motion artifact, and T2 propeller (rapid acquisition (sequences had to be utilized. There is questionable mild asymmetric gyral thickening and T2 hyperintensity in the posterior right temporal lobe and right operculum as seen on series 11, image 12. However, this appearance is not correlated with any asymmetric diffusion weighted signal, and not definitely correlated with any FLAIR and T1 signal alteration in the same areas. There is a small chronic micro hemorrhage in the posterior right temporal lobe (series 9, image 37). No cortical encephalomalacia or other chronic cerebral blood products identified. Deep gray matter nuclei, brainstem and cerebellum appear normal. Vascular: Major intracranial vascular flow voids appear normal. Skull and upper cervical spine: Negative. Visualized bone marrow signal is within normal limits. Sinuses/Orbits: Negative orbits soft tissues. Visualized paranasal sinuses and mastoids are stable and well pneumatized. Other: Visible internal auditory structures appear normal. Negative scalp soft tissues. MRA HEAD  FINDINGS Antegrade flow in the posterior circulation. Fairly codominant distal vertebral arteries. Both PICA origins are patent. Patent vertebrobasilar junction. No distal vertebral stenosis. No basilar stenosis. Normal SCA and PCA origins, fetal type right PCA. Left posterior communicating artery is present. Normal bilateral PCA branches. Antegrade flow in both ICA siphons. No siphon stenosis. Ophthalmic and posterior communicating artery origins appear normal. Normal carotid termini, MCA and ACA origins. The left ACA is mildly dominant. Mildly tortuous ACA A1 segments. Diminutive or absent anterior communicating artery. Visualized ACA branches are within normal limits. MCA M1 segments, MCA bifurcations, and visualized MCA branches are within normal limits. IMPRESSION: 1.  No acute intracranial abnormality. 2. Subtle T2 signal asymmetry in the posterior right temporal lobe is felt to be artifactual. There is mild for age nonspecific cerebral white matter signal changes, and rare chronic micro hemorrhages, which likely are chronic small vessel disease related. 3.  Negative intracranial MRA. Electronically Signed   By: Genevie Ann M.D.   On:  03/13/2016 11:15       Assessment/Plan:  with medical history significant for chronic diastolic CHF, COPD, hypertension, and hyperlipidemia who presents to the emergency department with several days of left arm and left leg numbness and weakness with intermittent chest pains and palpitations. Patient's symptoms are significantly improved except she continues to complain of left toe numbness. MRI brain is negative for acute stroke. Examination also has a component of giveaway weakness on the left side. Exact etiology of patient's symptoms is unknown diagnostic consideration would be psychogenic weakness. Agree with the current workup and management. Continue aspirin and statin therapy for long-term stroke prevention. Agree with the stroke workup since patient does have  underlying vascular risk factors.echocardiogram, lipid panel, hemoglobin A1c.  Field free to call neurology with any questions

## 2016-03-13 NOTE — Evaluation (Signed)
Physical Therapy Evaluation Patient Details Name: Hannah Cooper MRN: [id number] DOB: 07-15-1955 Today's Date: 03/13/2016   History of Present Illness  Patient is a 60 yo female admitted 03/13/16 with Lt-sided numbness/weakness, chest pain and palpitations.  MRI of brain negative for stroke.     PMH:  CHF, COPD, HTN, HLD, arthritis, obesity    Clinical Impression  Patient presents with problems listed below. Will benefit from acute PT to maximize functional mobility prior to d/c home with family.  Per chart, patient's symptoms resolved except for numbness in Lt toes.  With PT, patient reports decreased strength of LUE and LLE.  Varying performance of strength during testing and mobility - unable to move during testing, but able to hold LLE up against gravity at EOB.  Will continue to address patient's symptoms/deficits, and continue to assess need for f/u PT at d/c. Patient did have N/V during session.  RN notified.      Follow Up Recommendations Home health PT vs OP PT;Supervision for mobility/OOB (Will continue to assess)    Equipment Recommendations  Other (comment) (TBD)    Recommendations for Other Services       Precautions / Restrictions Precautions Precautions: Fall (Moderate risk with score of 12) Restrictions Weight Bearing Restrictions: No      Mobility  Bed Mobility Overal bed mobility: Needs Assistance Bed Mobility: Supine to Sit;Sit to Supine     Supine to sit: Min assist;HOB elevated Sit to supine: Min assist;HOB elevated   General bed mobility comments: Verbal cues to move to EOB.  Patient trying to use RLE to assist LLE - unable.  Assist to move LLE off of bed.  Patient moved trunk to sitting position.  Patient with upright midline posture.  Had patient attempt movement with LUE and LLE in sitting.  Varying results.  Assist to bring LE's onto bed to return to supine.  Transfers                 General transfer comment: Attempted to move sit>stand.   Patient became nauseated and vomited large amount.  Patient cleaned, and brushed teeth in sitting.  Ended session.  RN notified.  Ambulation/Gait                Stairs            Wheelchair Mobility    Modified Rankin (Stroke Patients Only)       Balance Overall balance assessment: Needs assistance Sitting-balance support: No upper extremity supported;Feet supported Sitting balance-Leahy Scale: Normal Sitting balance - Comments: Patient sitting with midline upright posture.  Able to initiate donning socks                                     Pertinent Vitals/Pain Pain Assessment: 0-10 Pain Score: 7  Pain Location: "Whole left side" "This area" pointing to chest. Pain Descriptors / Indicators:  (Difficulty describing pain) Pain Intervention(s): Monitored during session;Repositioned    Home Living Family/patient expects to be discharged to:: Private residence Living Arrangements: Spouse/significant other Available Help at Discharge: Family;Available 24 hours/day Type of Home: House Home Access: Stairs to enter Entrance Stairs-Rails: None Entrance Stairs-Number of Steps: 1 Home Layout: Multi-level;Able to live on main level with bedroom/bathroom Home Equipment: None      Prior Function Level of Independence: Independent               Hand Dominance  Dominant Hand: Right    Extremity/Trunk Assessment   Upper Extremity Assessment: Defer to OT evaluation           Lower Extremity Assessment: LLE deficits/detail   LLE Deficits / Details: Inconsistent findings.  At times unable to move LLE.  Then is holding LLE in air with knee extended.       Communication   Communication: No difficulties  Cognition Arousal/Alertness: Awake/alert Behavior During Therapy: Flat affect Overall Cognitive Status: Within Functional Limits for tasks assessed                      General Comments      Exercises     Assessment/Plan     PT Assessment Patient needs continued PT services  PT Problem List Decreased strength;Decreased activity tolerance;Decreased mobility;Decreased knowledge of use of DME;Impaired sensation;Obesity;Pain          PT Treatment Interventions DME instruction;Gait training;Functional mobility training;Therapeutic activities;Therapeutic exercise;Neuromuscular re-education;Patient/family education    PT Goals (Current goals can be found in the Care Plan section)  Acute Rehab PT Goals Patient Stated Goal: None stated PT Goal Formulation: With patient/family Time For Goal Achievement: 03/20/16 Potential to Achieve Goals: Good    Frequency Min 3X/week   Barriers to discharge        Co-evaluation               End of Session   Activity Tolerance: Treatment limited secondary to medical complications (Comment) (Limited by N/V) Patient left: in bed;with call bell/phone within reach;with family/visitor present Nurse Communication: Mobility status (Nausea/Vomiting - Lots of outside food on tray and eaten)    Functional Assessment Tool Used: Clinical judgement Functional Limitation: Mobility: Walking and moving around Mobility: Walking and Moving Around Current Status [id number]): At least 1 percent but less than 20 percent impaired, limited or restricted Mobility: Walking and Moving Around Goal Status [phone]): 0 percent impaired, limited or restricted    Time: 1731-1756 PT Time Calculation (min) (ACUTE ONLY): 25 min   Charges:   PT Evaluation $PT Eval Moderate Complexity: 1 Procedure PT Treatments $Therapeutic Activity: 8-22 mins   PT G Codes:   PT G-Codes **NOT FOR INPATIENT CLASS** Functional Assessment Tool Used: Clinical judgement Functional Limitation: Mobility: Walking and moving around Mobility: Walking and Moving Around Current Status [id number]): At least 1 percent but less than 20 percent impaired, limited or restricted Mobility: Walking and Moving Around Goal Status [phone]):  0 percent impaired, limited or restricted    Despina Pole 03/13/2016, 7:51 PM Carita Pian. Sanjuana Kava, [place] Pager [phone]

## 2016-03-14 ENCOUNTER — Observation Stay (HOSPITAL_COMMUNITY): Payer: Medicaid Other

## 2016-03-14 ENCOUNTER — Observation Stay (HOSPITAL_BASED_OUTPATIENT_CLINIC_OR_DEPARTMENT_OTHER): Payer: Medicaid Other

## 2016-03-14 DIAGNOSIS — R531 Weakness: Secondary | ICD-10-CM

## 2016-03-14 LAB — BASIC METABOLIC PANEL
ANION GAP: 10 (ref 5–15)
BUN: 10 mg/dL (ref 6–20)
CALCIUM: 9.4 mg/dL (ref 8.9–10.3)
CO2: 26 mmol/L (ref 22–32)
Chloride: 101 mmol/L (ref 101–111)
Creatinine, Ser: 0.75 mg/dL (ref 0.44–1.00)
Glucose, Bld: 102 mg/dL — ABNORMAL HIGH (ref 65–99)
Potassium: 3.9 mmol/L (ref 3.5–5.1)
SODIUM: 137 mmol/L (ref 135–145)

## 2016-03-14 LAB — CBC
HCT: 42 % (ref 36.0–46.0)
Hemoglobin: 14.5 g/dL (ref 12.0–15.0)
MCH: 29.8 pg (ref 26.0–34.0)
MCHC: 34.5 g/dL (ref 30.0–36.0)
MCV: 86.4 fL (ref 78.0–100.0)
PLATELETS: 258 10*3/uL (ref 150–400)
RBC: 4.86 MIL/uL (ref 3.87–5.11)
RDW: 13.8 % (ref 11.5–15.5)
WBC: 3.3 10*3/uL — AB (ref 4.0–10.5)

## 2016-03-14 LAB — VAS US CAROTID
LCCADDIAS: -22 cm/s
LCCADSYS: -70 cm/s
LEFT ECA DIAS: -12 cm/s
LEFT VERTEBRAL DIAS: -25 cm/s
LICADSYS: -80 cm/s
LICAPDIAS: -16 cm/s
LICAPSYS: -53 cm/s
Left CCA prox dias: -17 cm/s
Left CCA prox sys: -95 cm/s
Left ICA dist dias: -37 cm/s
RCCAPDIAS: -17 cm/s
RCCAPSYS: -68 cm/s
RIGHT ECA DIAS: -15 cm/s
RIGHT VERTEBRAL DIAS: -17 cm/s
Right cca dist sys: -69 cm/s

## 2016-03-14 LAB — LIPID PANEL
Cholesterol: 123 mg/dL (ref 0–200)
HDL: 46 mg/dL (ref >40)
LDL Cholesterol: 60 mg/dL (ref 0–99)
TRIGLYCERIDES: 85 mg/dL (ref <150)
Total CHOL/HDL Ratio: 2.7 RATIO
VLDL: 17 mg/dL (ref 0–40)

## 2016-03-14 MED ORDER — GADOBENATE DIMEGLUMINE 529 MG/ML IV SOLN
20.0000 mL | Freq: Once | INTRAVENOUS | Status: AC
  Administered 2016-03-14: 20 mL via INTRAVENOUS

## 2016-03-14 MED ORDER — ALUM & MAG HYDROXIDE-SIMETH 200-200-20 MG/5ML PO SUSP: 15.0000 mL | ORAL | Status: DC | PRN

## 2016-03-14 MED ORDER — METHOCARBAMOL 500 MG PO TABS
500.0000 mg | ORAL_TABLET | Freq: Three times a day (TID) | ORAL | Status: DC
  Administered 2016-03-14: 500 mg via ORAL
  Filled 2016-03-14 (×2): qty 1

## 2016-03-14 MED ORDER — ONDANSETRON HCL 4 MG/2ML IJ SOLN: 4.0000 mg | Freq: Four times a day (QID) | INTRAMUSCULAR | Status: DC | PRN

## 2016-03-14 MED ORDER — PROMETHAZINE HCL 25 MG/ML IJ SOLN
6.2500 mg | Freq: Four times a day (QID) | INTRAMUSCULAR | Status: DC | PRN
  Filled 2016-03-14: qty 1

## 2016-03-14 MED ORDER — FAMOTIDINE 20 MG PO TABS
20.0000 mg | ORAL_TABLET | Freq: Every day | ORAL | Status: DC
  Administered 2016-03-15: 20 mg via ORAL
  Filled 2016-03-14: qty 1

## 2016-03-14 NOTE — Progress Notes (Signed)
Physical Therapy Treatment Patient Details Name: Hannah Cooper MRN: [id number] DOB: 11/12/1955 Today's Date: 03/14/2016    History of Present Illness Patient is a 60 yo female admitted 03/13/16 with Lt-sided numbness/weakness, chest pain and palpitations.  MRI of brain negative for stroke.     PMH:  CHF, COPD, HTN, HLD, arthritis, obesity    PT Comments    Pt with inconsistent presentation during session. Reported L-sided weakness appeared to be variable depending on task. Chair follow required for gait training, with up to mod assist at times for balance support and safety. Will continue to follow.   Follow Up Recommendations  Home health PT;Supervision for mobility/OOB     Equipment Recommendations  Rolling walker with 5" wheels;3in1 (PT) (TBD)    Recommendations for Other Services       Precautions / Restrictions Precautions Precautions: Fall Restrictions Weight Bearing Restrictions: No    Mobility  Bed Mobility               General bed mobility comments: Pt sitting up in recliner upon PT arrival.   Transfers Overall transfer level: Needs assistance Equipment used: Rolling walker (2 wheeled) Transfers: Sit to/from Stand Sit to Stand: Min assist;+2 safety/equipment         General transfer comment: VC's for hand placement on seated surface for safety. Pt essentially stood up without use of LUE despite cues to attempt to use it during transfer. Increased time required for pt to gain/maintain standing balance.   Ambulation/Gait Ambulation/Gait assistance: Mod assist;+2 safety/equipment Ambulation Distance (Feet): 100 Feet (50', seated rest, 50') Assistive device: Rolling walker (2 wheeled) Gait Pattern/deviations: Step-through pattern;Decreased step length - left;Decreased stance time - left;Decreased weight shift to left Gait velocity: Decreased Gait velocity interpretation: Below normal speed for age/gender General Gait Details: Pt with reported  increase in L-sided pain but was unable to elaborate. Heavy lean to the right side was able to be corrected with cues, however pt also with exaggerated antalgic gait pattern favoring L side with this truncal correction. 1 seated rest break due to fatigue   Stairs            Wheelchair Mobility    Modified Rankin (Stroke Patients Only)       Balance Overall balance assessment: Needs assistance Sitting-balance support: Feet supported;No upper extremity supported Sitting balance-Leahy Scale: Normal Sitting balance - Comments: Patient sitting with midline upright posture.  Able to initiate donning socks   Standing balance support: No upper extremity supported Standing balance-Leahy Scale: Fair Standing balance comment: Statically                    Cognition Arousal/Alertness: Awake/alert Behavior During Therapy: WFL for tasks assessed/performed Overall Cognitive Status: No family/caregiver present to determine baseline cognitive functioning                      Exercises      General Comments        Pertinent Vitals/Pain Pain Assessment: Faces Faces Pain Scale: Hurts even more Pain Location: LUE/LLE Pain Descriptors / Indicators: Grimacing;Guarding Pain Intervention(s): Limited activity within patient's tolerance;Monitored during session;Repositioned    Home Living                      Prior Function            PT Goals (current goals can now be found in the care plan section) Acute Rehab PT Goals Patient Stated Goal: None  stated PT Goal Formulation: With patient Time For Goal Achievement: 03/20/16 Potential to Achieve Goals: Good Progress towards PT goals: Progressing toward goals    Frequency    Min 3X/week      PT Plan Current plan remains appropriate    Co-evaluation             End of Session Equipment Utilized During Treatment: Gait belt Activity Tolerance: Patient tolerated treatment well Patient left: in  chair;with call bell/phone within reach;with chair alarm set     Time: [id number] PT Time Calculation (min) (ACUTE ONLY): 24 min  Charges:  $Gait Training: 23-37 mins                    G Codes:      Thelma Comp 2016-03-31, 12:38 PM   Rolinda Roan, PT, DPT Acute Rehabilitation Services Pager: [phone]

## 2016-03-14 NOTE — Progress Notes (Signed)
[facility] Progress Note  Patient: Hannah Cooper [id number]   PCP: Maggie Font, MD DOB: Dec 10, 1955   DOA: 03/13/2016   DOS: 03/14/2016   Date of Service: the patient was seen and examined on 03/14/2016  Brief hospital course: Pt. with PMH of HTN, HLD, CHF, COPD, arthritis; admitted on 03/13/2016, with complaint of left-sided weakness, was found to have left-sided weakness without any CVA. Currently further plan is completed stroke workup as well as further workup for the weakness.  Assessment and Plan: 1. Left-sided weakness - Pt reports sxs developed ~1 wk prior to admission; primary concern is for CVA  - Head CT negative for acute intracranial abnormality; tPA not considered d/t presentation outside the appropriate timeframe - MRI of the brain was negative, despite patient's persistent symptoms. Consulted neurology, appreciate input, recommends no further workup at present. We'll check MRI of the C-spine due to patient's persistence of paresthesia as well as weakness. X-ray lumbar spine shows significant arthropathy although no evidence of disc space narrowing or stenosis identified.  Chest pain, palpitations  - Chest pain has been chronic and coronary angiography revealed normal coronaries in May 2014  - Initial troponin is undetectable and EKG features flattened T-waves in lateral leads, but appears less prominent than on prior EKG - She was given ASA 324 mg in ED  - Plan to monitor on telemetry, 2D echo  Chronic diastolic CHF  - Appears euvolemic on presentation   COPD - Stable on admission with no suggestion of exacerbation  - Continue scheduled Anoro Ellipta and prn albuterol   Hypertension  - permissive HTN  Intractable nausea and vomiting. Checking x-ray abdomen changing patient is to clear liquid diet. Echo from constipation.  Pain management: Continue home regimen Activity: Consulted physical therapy Bowel regimen: last BM prior to  admission Diet: Clear liquid diet DVT Prophylaxis: subcutaneous Heparin  Advance goals of care discussion: Full code  Family Communication: family was present at bedside, at the time of interview. The pt provided permission to discuss medical plan with the family. Opportunity was given to ask question and all questions were answered satisfactorily.   Disposition:  Discharge to home with home health. Expected discharge date: 03/15/2016.  Consultants: Neurology Procedures: Echocardiogram, carotid Doppler  Antibiotics: Anti-infectives    None       Subjective: Complains about nausea and vomiting. Also, visible persistent left-sided weakness as well as severe left-sided pain.  Objective: Physical Exam: Vitals:   03/14/16 1054 03/14/16 1127 03/14/16 1438 03/14/16 1856  BP: (!) 155/66  140/68 120/70  Pulse: 71  70 72  Resp: 18  18 18   Temp: 98.7 F (37.1 C)  98 F (36.7 C) 98.9 F (37.2 C)  TempSrc: Oral  Oral Oral  SpO2: 97% 97% 98% 98%  Weight:      Height:       No intake or output data in the 24 hours ending 03/14/16 1907 Filed Weights   03/13/16 0010  Weight: 99.3 kg (219 lb)    General: Alert, Awake and Oriented to Time, Place and Person. Appear in mild distress, affect appropriate Eyes: PERRL Conjunctiva normal ENT: Oral Mucosa clear moist Neck: no JVD, no Abnormal Mass Or lumps Cardiovascular: S1 and S2 Present, no Murmur, Respiratory: Bilateral Air entry equal and Decreased, no use of accessory muscle, Clear to Auscultation, no Crackles, no wheezes Abdomen: Bowel Sound present, Soft and no tenderness Skin: no redness, no Rash, no induration Extremities: no Pedal edema, no calf tenderness Neurologic: Grossly  no focal neuro deficit. Left-sided weakness as well as paresthesia.   Data Reviewed: CBC:  Recent Labs Lab 03/13/16 0357 03/13/16 0405 03/14/16 0739  WBC 7.6  --  3.3*  NEUTROABS 4.9  --   --   HGB 12.5 13.6 14.5  HCT 37.0 40.0 42.0  MCV 88.1   --  86.4  PLT 261  --  0000000   Basic Metabolic Panel:  Recent Labs Lab 03/13/16 0357 03/13/16 0405 03/13/16 1451 03/14/16 0739  NA 135 137 136 137  K 4.8 6.0* 2.8* 3.9  CL 101 99* 101 101  CO2 27  --  28 26  GLUCOSE 117* 115* 127* 102*  BUN 13 16 10 10   CREATININE 0.87 0.90 0.84 0.75  CALCIUM 9.2  --  8.9 9.4    Liver Function Tests:  Recent Labs Lab 03/13/16 0357  AST 29  ALT 28  ALKPHOS 70  BILITOT 1.2  PROT 8.1  ALBUMIN 4.2    Recent Labs Lab 03/13/16 0357  LIPASE 29   No results for input(s): AMMONIA in the last 168 hours. Coagulation Profile: No results for input(s): INR, PROTIME in the last 168 hours. Cardiac Enzymes:  Recent Labs Lab 03/13/16 0357  TROPONINI <0.03   BNP (last 3 results) No results for input(s): PROBNP in the last 8760 hours.  CBG: No results for input(s): GLUCAP in the last 168 hours.  Studies: Dg Lumbar Spine Complete  Result Date: 03/14/2016 CLINICAL DATA:  O awakened 5 days ago with back pain radiating down the left leg. No known injury EXAM: LUMBAR SPINE - COMPLETE 4+ VIEW COMPARISON:  Lumbar spine series of March 03, 2014 FINDINGS: The lumbar vertebral bodies are preserved in height. The disc space heights are well maintained. There is no spondylolisthesis. There is mild facet joint hypertrophy at L4-5 and L5-S1. The pedicles and transverse processes are intact. The observed portions of the sacrum are normal. IMPRESSION: Mild degenerative facet joint change at L4-5 and L5-S1. No compression fracture, significant disc space narrowing, nor other abnormality is observed. If the patient's radicular symptoms persist, lumbar spine MRI would be the most useful next imaging step. Electronically Signed   By: David  Martinique M.D.   On: 03/14/2016 13:36     Scheduled Meds: . amLODipine  10 mg Oral Daily  . arformoterol  15 mcg Nebulization Daily  . aspirin  300 mg Rectal Daily   Or  . aspirin  325 mg Oral Daily  . atorvastatin  20  mg Oral QPM  . chlorthalidone  25 mg Oral Daily  . cycloSPORINE  1 drop Both Eyes BID  . enoxaparin (LOVENOX) injection  40 mg Subcutaneous Q24H  . famotidine  20 mg Oral Daily  . isosorbide mononitrate  30 mg Oral Daily  . losartan  100 mg Oral Daily  . methocarbamol  500 mg Oral TID  . spironolactone  25 mg Oral BID  . umeclidinium bromide  1 puff Inhalation Daily   Continuous Infusions: PRN Meds: acetaminophen, albuterol, alum & mag hydroxide-simeth, HYDROcodone-acetaminophen, nitroGLYCERIN, ondansetron (ZOFRAN) IV, ondansetron, promethazine, senna-docusate  Time spent: 30 minutes  Author: Berle Mull, MD [facility] Pager: [phone] 03/14/2016 7:07 PM  If 7PM-7AM, please contact night-coverage at [email or web address], password [facility]

## 2016-03-14 NOTE — Evaluation (Signed)
Occupational Therapy Evaluation Patient Details Name: Hannah Cooper MRN: [id number] DOB: [birth year] Today's Date: 03/14/2016    History of Present Illness Patient is a 60 yo female admitted 03/13/16 with Lt-sided numbness/weakness, chest pain and palpitations.  MRI of brain negative for stroke.     PMH:  CHF, COPD, HTN, HLD, arthritis, obesity   Clinical Impression   PT admitted with L side weakness and chest pain. Pt currently with functional limitiations due to the deficits listed below (see OT problem list). Pt reports dizziness for brief periods of times and vomiting. Pt reports no dizziness or nausea at this time. Pt has 3 daughters at home and a 2 yo child she (A) with care for.  Pt will benefit from skilled OT to increase their independence and safety with adls and balance to allow discharge outpatient. Pt with L hand deficits.Pt provided L UE exercise program and x2 handouts.     Follow Up Recommendations  Outpatient OT    Equipment Recommendations  None recommended by OT    Recommendations for Other Services       Precautions / Restrictions Precautions Precautions: Fall      Mobility Bed Mobility               General bed mobility comments: inc hair on arrival  Transfers Overall transfer level: Needs assistance Equipment used: Rolling walker (2 wheeled) Transfers: Sit to/from Stand Sit to Stand: Min guard         General transfer comment: cues for hand placement. pt with improper placement of L UE and needed cues for placement.     Balance Overall balance assessment: Needs assistance         Standing balance support: No upper extremity supported;During functional activity Standing balance-Leahy Scale: Good                              ADL Overall ADL's : Needs assistance/impaired Eating/Feeding: Set up   Grooming: Wash/dry hands;Min guard;Standing Grooming Details (indicate cue type and reason): pt using R hand to gather  soap and wash the L hand. pt cued to use L hand as much as possible. Pt with elbow extension to place L hand under water Upper Body Bathing: Minimal assitance   Lower Body Bathing: Minimal assistance           Toilet Transfer: Min guard           Functional mobility during ADLs: Min guard General ADL Comments: cues for hand placement on RW. Pt with inconsistency in use of L UE     Vision Additional Comments: Pt with R eye occluded reports blurred vision and L eye occluded reports R eye is also blurry but less than L.   Perception     Praxis      Pertinent Vitals/Pain Pain Assessment: Faces Faces Pain Scale: Hurts little more Pain Location: L Le pain at toes Pain Descriptors / Indicators: Aching;Numbness Pain Intervention(s): Monitored during session;Repositioned     Hand Dominance Right   Extremity/Trunk Assessment Upper Extremity Assessment Upper Extremity Assessment: LUE deficits/detail;RUE deficits/detail RUE Deficits / Details: pt dropping objects with R hand and required incr effor tto hold cell phone LUE Deficits / Details: pt able to close hand into a fist with incr time and effort. pt observed holding ipad with bil ue and then when asked to do function task pt with facial grimace and sounds to show effort in movement.  Pt holding digits in extension less than an inch apart and sustaining that position. pt with digits fully extended LUE Sensation: decreased light touch;decreased proprioception LUE Coordination: decreased fine motor;decreased gross motor   Lower Extremity Assessment Lower Extremity Assessment: Defer to PT evaluation   Cervical / Trunk Assessment Cervical / Trunk Assessment: Normal   Communication Communication Communication: No difficulties   Cognition Arousal/Alertness: Awake/alert Behavior During Therapy: Flat affect Overall Cognitive Status: Within Functional Limits for tasks assessed                     General Comments        Exercises       Shoulder Instructions      Home Living Family/patient expects to be discharged to:: Private residence Living Arrangements: Spouse/significant other Available Help at Discharge: Family;Available 24 hours/day Type of Home: House Home Access: Stairs to enter [employer] of Steps: 1 Entrance Stairs-Rails: None Home Layout: Multi-level;Able to live on main level with bedroom/bathroom     Bathroom Shower/Tub: Teacher, early years/pre: Standard     Home Equipment: None          Prior Functioning/Environment Level of Independence: Independent                 OT Problem List: Decreased strength;Decreased activity tolerance;Impaired balance (sitting and/or standing);Decreased coordination;Impaired vision/perception;Decreased range of motion;Decreased safety awareness;Decreased knowledge of use of DME or AE;Decreased knowledge of precautions;Impaired sensation;Obesity;Impaired UE functional use   OT Treatment/Interventions: Self-care/ADL training;Therapeutic exercise;Neuromuscular education;DME and/or AE instruction;Therapeutic activities;Patient/family education;Balance training;Visual/perceptual remediation/compensation    OT Goals(Current goals can be found in the care plan section) Acute Rehab OT Goals Patient Stated Goal: None stated OT Goal Formulation: With patient Potential to Achieve Goals: Good  OT Frequency: Min 2X/week   Barriers to D/C:            Co-evaluation              End of Session Equipment Utilized During Treatment: Rolling walker;Gait belt Nurse Communication: Mobility status;Precautions  Activity Tolerance: Patient tolerated treatment well Patient left: in chair;with call bell/phone within reach   Time: 0745-0821 OT Time Calculation (min): 36 min Charges:  OT General Charges $OT Visit: 1 Procedure OT Evaluation $OT Eval Moderate Complexity: 1 Procedure G-Codes: OT G-codes **NOT FOR INPATIENT  CLASS** Functional Assessment Tool Used: clinical judgement Functional Limitation: Self care Self Care Current Status [id number]): At least 1 percent but less than 20 percent impaired, limited or restricted Self Care Goal Status [id number]): At least 1 percent but less than 20 percent impaired, limited or restricted  Peri Maris 03/14/2016, 8:28 AM   Jeri Modena   OTR/L Pager: [phone] Office: [phone] .

## 2016-03-14 NOTE — Progress Notes (Signed)
VASCULAR LAB PRELIMINARY  PRELIMINARY  PRELIMINARY  PRELIMINARY  Carotid duplex completed.    Preliminary report:  1-39% ICA plaquing. Vertebral artery flow is antegrade.   Denine Brotz, RVT 03/14/2016, 1:55 PM

## 2016-03-15 ENCOUNTER — Observation Stay (HOSPITAL_BASED_OUTPATIENT_CLINIC_OR_DEPARTMENT_OTHER): Payer: Medicaid Other

## 2016-03-15 DIAGNOSIS — I6789 Other cerebrovascular disease: Secondary | ICD-10-CM

## 2016-03-15 DIAGNOSIS — R531 Weakness: Secondary | ICD-10-CM | POA: Diagnosis not present

## 2016-03-15 LAB — ECHOCARDIOGRAM COMPLETE
CHL CUP MV DEC (S): 356
E decel time: 356 msec
E/e' ratio: 7.15
FS: 37 % (ref 28–44)
HEIGHTINCHES: 64 in
IV/PV OW: 0.87
LA vol index: 20.3 mL/m2
LA vol: 44 mL
LADIAMINDEX: 1.57 cm/m2
LASIZE: 34 mm
LAVOLA4C: 40.3 mL
LDCA: 2.27 cm2
LEFT ATRIUM END SYS DIAM: 34 mm
LV E/e' medial: 7.15
LV E/e'average: 7.15
LV PW d: 9.29 mm — AB (ref 0.6–1.1)
LV TDI E'LATERAL: 8.61
LVELAT: 8.61 cm/s
LVOT diameter: 17 mm
Lateral S' vel: 10.7 cm/s
MV pk A vel: 85.9 m/s
MV pk E vel: 61.6 m/s
TAPSE: 12.6 mm
WEIGHTICAEL: 3504 [oz_av]

## 2016-03-15 LAB — HEMOGLOBIN A1C
HEMOGLOBIN A1C: 6.2 % — AB (ref 4.8–5.6)
MEAN PLASMA GLUCOSE: 131 mg/dL

## 2016-03-15 MED ORDER — FAMOTIDINE 20 MG PO TABS: 20.0000 mg | ORAL_TABLET | Freq: Every day | ORAL | 0 refills | Status: DC

## 2016-03-15 MED ORDER — SENNOSIDES-DOCUSATE SODIUM 8.6-50 MG PO TABS: 1.0000 | ORAL_TABLET | Freq: Every evening | ORAL | 0 refills | Status: DC | PRN

## 2016-03-15 MED ORDER — ONDANSETRON 4 MG PO TBDP: 4.0000 mg | ORAL_TABLET | Freq: Three times a day (TID) | ORAL | 0 refills | Status: DC | PRN

## 2016-03-15 MED ORDER — POLYETHYLENE GLYCOL 3350 17 G PO PACK: 17.0000 g | PACK | Freq: Every day | ORAL | 0 refills | Status: DC

## 2016-03-15 MED ORDER — FUROSEMIDE 20 MG PO TABS: 20.0000 mg | ORAL_TABLET | Freq: Every day | ORAL | 0 refills | Status: DC | PRN

## 2016-03-15 MED ORDER — POLYETHYLENE GLYCOL 3350 17 G PO PACK: 17.0000 g | PACK | Freq: Every day | ORAL | Status: DC

## 2016-03-15 MED ORDER — SENNOSIDES-DOCUSATE SODIUM 8.6-50 MG PO TABS: 1.0000 | ORAL_TABLET | Freq: Two times a day (BID) | ORAL | Status: DC

## 2016-03-15 MED ORDER — ASPIRIN 325 MG PO TABS: 325.0000 mg | ORAL_TABLET | Freq: Every day | ORAL | 0 refills | Status: DC

## 2016-03-15 MED ORDER — METHOCARBAMOL 500 MG PO TABS: 500.0000 mg | ORAL_TABLET | Freq: Three times a day (TID) | ORAL | 0 refills | Status: DC | PRN

## 2016-03-15 NOTE — Progress Notes (Signed)
Patient discharged home with family. IV and telemetry discontinued. Discharge information given. Patient questions asked and answered. Patient to transport from unit via wheelchair and travel home via family car. Wendee Copp

## 2016-03-15 NOTE — Progress Notes (Signed)
  Echocardiogram 2D Echocardiogram has been performed.  Hannah Cooper 03/15/2016, 2:17 PM

## 2016-03-15 NOTE — Care Management Note (Addendum)
Case Management Note  Patient Details  Name: Hannah Cooper MRN: [id number] Date of Birth: 1955/11/15  Subjective/Objective:                    Action/Plan: Plan is for patient to discharge home with CM consult for outpatient therapy. CM met with the patient and she would like to attend outpatient therapy at [facility]. Orders placed in EPIC and information on the AVS. Pt also with orders for walker and 3 in1. Larene Beach with [facility] DME notified and will deliver the equipment to the room.   Expected Discharge Date:                  Expected Discharge Plan:  Home/Self Care  In-House Referral:     Discharge planning Services  CM Consult  Post Acute Care Choice:    Choice offered to:     DME Arranged:    DME Agency:     HH Arranged:    [place] Agency:     Status of Service:  Completed, signed off  If discussed at [employer] of Stay Meetings, dates discussed:    Additional Comments:  Pollie Friar, RN 03/15/2016, 2:57 PM

## 2016-03-19 NOTE — Discharge Summary (Signed)
[facility] Discharge Summary   Patient: Hannah Cooper [id number]   PCP: Maggie Font, MD DOB: [birth year]   Date of admission: 03/13/2016   Date of discharge: 03/15/2016     Discharge Diagnoses:  Principal Problem:   Left-sided weakness Active Problems:   Essential hypertension   Chest pain   Hyperlipidemia   Chronic diastolic CHF (congestive heart failure) (HCC)   COPD (chronic obstructive pulmonary disease) ([place])   Admitted From: Home Disposition:  Home with home health  Recommendations for Outpatient Follow-up:  1. Follow-up with PCP in one week. Next and follow-up with neurology in one month.   Follow-up Information    Maggie Font, MD. Schedule an appointment as soon as possible for a visit in 1 week(s).   Specialty:  Family Medicine Contact information: [address] [place] [address] [phone]        [facility] Follow up.   Specialty:  Rehabilitation Why:  They will contact you for the first appointment. Contact information: [address] [id number] [place] [phone]       [facility], Trey Sailors, MD. Schedule an appointment as soon as possible for a visit in 1 month(s).   Specialty:  Neurology Contact information: [address] [place] [address] [phone]          Diet recommendation: Cardiac diet  Activity: The patient is advised to gradually reintroduce usual activities.  Discharge Condition: good  Code Status: Full code  History of present illness: As per the H and P dictated on admission, "Hannah Cooper is a 60 y.o. female with medical history significant for chronic diastolic CHF, COPD, hypertension, and hyperlipidemia who presents to the emergency department with several days of left arm and left leg numbness and weakness with intermittent chest pains and palpitations. Patient reports that she had been in her usual state of health  until approximately one week ago when she began to note some intermittent palpitations. She was also having some chest pain, but describes this as a chronic problem. Around that same time, she noted the insidious development of numbness and weakness involving the left arm and left leg, but sparing the left face. She denies any recent fall or trauma and denies any headache or change in vision or hearing. She had never experienced similar symptoms previously. Initially, she thought that the numbness and weakness would resolve, but reports that it has slowly worsened. She also notes subjective fevers and a mild sore throat for the past couple days, but denies any rhinorrhea, cough, or sick contacts"  Hospital Course:   Summary of her active problems in the hospital is as following. . Left-sided weakness Unclear etiology, functional per neurology. - Pt reports sxs developed ~1 wk prior to admission; primary concern is for CVA  - Head CT negative for acute intracranial abnormality; tPA not considered d/t presentation outside the appropriate timeframe - MRI of the brain was negative, despite patient's persistent symptoms. Consulted neurology, appreciate input, recommends no further workup at present. Negative MRI of the C-spine for any acute abnormality that can explain paresthesia as well as weakness. X-ray lumbar spine shows significant arthropathy although no evidence of disc space narrowing or stenosis identified.  Chest pain, palpitations  - Chest pain has been chronic and coronary angiography revealed normal coronaries in May 2014  - troponin is undetectable and EKG features flattened T-waves in lateral leads, but appears less prominent than on prior  EKG - She was given ASA 324 mg in ED  Unremarkable echocardiogram  Chronic diastolic CHF  - Appears euvolemic on presentation   COPD - Stable on admission with no suggestion of exacerbation  - Continue scheduled Anoro Ellipta and prn albuterol    Hypertension  Continue home regimen  Intractable nausea and vomiting. Constipation, partial SBO, resolved X-ray abdomen was showing possible partial SBO Resolved with clear liquid diet with fluid management. Recommend to continue using MiraLAX at home.    All other chronic medical condition were stable during the hospitalization.  Patient was seen by physical therapy, who recommended home health which was arranged by Education officer, museum and case Freight forwarder. On the day of the discharge the patient's vitals are stable, and no other acute medical condition were reported by patient. the patient was felt safe to be discharge at home with home health.  Procedures and Results:  Echocardiogram   Consultations:  Neurology  DISCHARGE MEDICATION: Discharge Medication List as of 03/15/2016  4:35 PM    START taking these medications   Details  aspirin 325 MG tablet Take 1 tablet (325 mg total) by mouth daily., Starting Wed 03/16/2016, Normal    famotidine (PEPCID) 20 MG tablet Take 1 tablet (20 mg total) by mouth daily., Starting Wed 03/16/2016, Normal    methocarbamol (ROBAXIN) 500 MG tablet Take 1 tablet (500 mg total) by mouth every 8 (eight) hours as needed for muscle spasms., Starting Tue 03/15/2016, Normal    ondansetron (ZOFRAN-ODT) 4 MG disintegrating tablet Take 1 tablet (4 mg total) by mouth every 8 (eight) hours as needed for nausea or vomiting., Starting Tue 03/15/2016, Normal    polyethylene glycol (MIRALAX / GLYCOLAX) packet Take 17 g by mouth daily., Starting Tue 03/15/2016, Normal    senna-docusate (SENOKOT-S) 8.6-50 MG tablet Take 1 tablet by mouth at bedtime as needed for mild constipation., Starting Tue 03/15/2016, Normal      CONTINUE these medications which have CHANGED   Details  furosemide (LASIX) 20 MG tablet Take 1 tablet (20 mg total) by mouth daily as needed for fluid or edema., Starting Tue 03/15/2016, Normal      CONTINUE these medications which have NOT  CHANGED   Details  acetaminophen (TYLENOL) 500 MG tablet Take 1,000 mg by mouth every 6 (six) hours as needed for headache (pain). , Historical Med    albuterol (PROVENTIL) (2.5 MG/3ML) 0.083% nebulizer solution Take 2.5 mg by nebulization every 6 (six) hours as needed for wheezing or shortness of breath., Historical Med    Albuterol Sulfate (PROAIR RESPICLICK) 123XX123 (90 Base) MCG/ACT AEPB Inhale 2 puffs into the lungs every 4 (four) hours as needed (shortness of breath/ wheezing)., Historical Med    amLODipine (NORVASC) 10 MG tablet Take 1 tablet (10 mg total) by mouth daily., Starting Wed 06/17/2015, Normal    atorvastatin (LIPITOR) 20 MG tablet Take 20 mg by mouth daily. , Historical Med    chlorthalidone (HYGROTON) 25 MG tablet Take 1 tablet (25 mg total) by mouth daily., Starting Tue 10/06/2015, Normal    cycloSPORINE (RESTASIS) 0.05 % ophthalmic emulsion Place 1 drop into both eyes 2 (two) times daily. , Historical Med    HYDROcodone-acetaminophen (NORCO/VICODIN) 5-325 MG per tablet Take 1 tablet by mouth every 4 (four) hours as needed for moderate pain., Starting Mon 01/05/2015, Print    ibuprofen (ADVIL,MOTRIN) 800 MG tablet Take 1 tablet by mouth 2 (two) times daily as needed (back pain). , Starting Fri 08/01/2014, Historical Med  isosorbide mononitrate (IMDUR) 30 MG 24 hr tablet TAKE 1 TABLET (30 MG TOTAL) BY MOUTH DAILY., Normal    loratadine (CLARITIN) 10 MG tablet Take 10 mg by mouth daily as needed (seasonal allergies). , Starting Tue 02/09/2016, Historical Med    losartan (COZAAR) 100 MG tablet TAKE ONE TABLET BY MOUTH DAILY FOR BLOOD PRESSURE, Historical Med    meloxicam (MOBIC) 7.5 MG tablet Take 7.5 mg by mouth daily at 12 noon. , Starting Tue 03/10/2015, Historical Med    nitroGLYCERIN (NITROSTAT) 0.4 MG SL tablet Place 1 tablet (0.4 mg total) under the tongue every 5 (five) minutes as needed for chest pain., Starting Wed 09/09/2015, Normal    potassium chloride (MICRO-K)  10 MEQ CR capsule TAKE ONE CAPSULE BY MOUTH EVERY DAY AS NEEDED, Normal    spironolactone (ALDACTONE) 25 MG tablet Take 1 tablet (25 mg total) by mouth 2 (two) times daily., Starting Mon 10/19/2015, Normal    umeclidinium-vilanterol (ANORO ELLIPTA) 62.5-25 MCG/INH AEPB Inhale 1 puff into the lungs daily as needed (shortness of breath/ wheezing)., Historical Med      STOP taking these medications     potassium chloride SA (KLOR-CON M20) 20 MEQ tablet        Allergies  Allergen Reactions  . Other Swelling    [place] lipstick   Discharge Instructions    Ambulatory referral to Neurology    Complete by:  As directed    An appointment is requested in approximately: 4 weeks   Ambulatory referral to Occupational Therapy    Complete by:  As directed    Ambulatory referral to Physical Therapy    Complete by:  As directed    Contact number is: [phone]     Discharge Exam: Filed Weights   03/13/16 0010  Weight: 99.3 kg (219 lb)   Vitals:   03/15/16 0941 03/15/16 1450  BP: 120/85 110/65  Pulse: 81 80  Resp: 18 18  Temp: 98.3 F (36.8 C) 98.8 F (37.1 C)   General: Appear in mild distress, no Rash; Oral Mucosa moist. Cardiovascular: S1 and S2 Present, no Murmur, no JVD Respiratory: Bilateral Air entry present and Clear to Auscultation, no Crackles, no wheezes Abdomen: Bowel Sound present, Soft and no tenderness Extremities: no Pedal edema, no calf tenderness Neurology: Left-sided weakness  The results of significant diagnostics from this hospitalization (including imaging, microbiology, ancillary and laboratory) are listed below for reference.    Significant Diagnostic Studies: Dg Chest 2 View  Result Date: 03/13/2016 CLINICAL DATA:  Mid central chest pain. Heart fluttering. Left-sided numbness, shortness of breath, sore throat, nonproductive cough, and fever. History of COPD, congestive heart failure, hypertension. EXAM: CHEST  2 VIEW COMPARISON:  04/26/2015 FINDINGS:  Slightly shallow inspiration. Linear fibrosis or atelectasis in the lung bases similar to previous study. No focal airspace disease or consolidation in the lungs. No blunting of costophrenic angles. No pneumothorax. Mediastinal contours appear intact. Tortuous aorta. Degenerative changes in the spine and shoulders. IMPRESSION: Slightly shallow inspiration with mild linear atelectasis or fibrosis in the lung bases, similar to prior study. No evidence of active pulmonary disease. Electronically Signed   By: Lucienne Capers M.D.   On: 03/13/2016 01:29   Dg Lumbar Spine Complete  Result Date: 03/14/2016 CLINICAL DATA:  O awakened 5 days ago with back pain radiating down the left leg. No known injury EXAM: LUMBAR SPINE - COMPLETE 4+ VIEW COMPARISON:  Lumbar spine series of March 03, 2014 FINDINGS: The lumbar vertebral bodies are preserved in  height. The disc space heights are well maintained. There is no spondylolisthesis. There is mild facet joint hypertrophy at L4-5 and L5-S1. The pedicles and transverse processes are intact. The observed portions of the sacrum are normal. IMPRESSION: Mild degenerative facet joint change at L4-5 and L5-S1. No compression fracture, significant disc space narrowing, nor other abnormality is observed. If the patient's radicular symptoms persist, lumbar spine MRI would be the most useful next imaging step. Electronically Signed   By: David  Martinique M.D.   On: 03/14/2016 13:36   Ct Head Wo Contrast  Result Date: 03/13/2016 CLINICAL DATA:  Left arm weakness. Nausea, chest pain, shortness of breath, abdominal pain, cough, sore throat, fever, and low back pain. EXAM: CT HEAD WITHOUT CONTRAST TECHNIQUE: Contiguous axial images were obtained from the base of the skull through the vertex without intravenous contrast. COMPARISON:  04/26/2015 FINDINGS: Brain: No evidence of acute infarction, hemorrhage, hydrocephalus, extra-axial collection or mass lesion/mass effect. Vascular: No  hyperdense vessel or unexpected calcification. Skull: Normal. Negative for fracture or focal lesion. Sinuses/Orbits: No acute finding. Other: None. IMPRESSION: No acute intracranial abnormalities. Electronically Signed   By: Lucienne Capers M.D.   On: 03/13/2016 01:28   Mr Brain Wo Contrast  Result Date: 03/13/2016 CLINICAL DATA:  60 year old female with several days of left extremity weakness. Chest pain and palpitations. Hypertension. Initial encounter. EXAM: MRI HEAD WITHOUT CONTRAST MRA HEAD WITHOUT CONTRAST TECHNIQUE: Multiplanar, multiecho pulse sequences of the brain and surrounding structures were obtained without intravenous contrast. Angiographic images of the head were obtained using MRA technique without contrast. COMPARISON:  Head CT without contrast 0050 hours today. FINDINGS: MRI HEAD FINDINGS Brain: Cerebral volume is within normal limits for age. No restricted diffusion to suggest acute infarction. No midline shift, mass effect, evidence of mass lesion, ventriculomegaly, extra-axial collection or acute intracranial hemorrhage. Cervicomedullary junction and pituitary are within normal limits. Scattered small nonspecific foci of cerebral white matter T2 and FLAIR hyperintensity, primarily in the anterior frontal lobes and mostly subcortical. There is mild motion artifact, and T2 propeller (rapid acquisition (sequences had to be utilized. There is questionable mild asymmetric gyral thickening and T2 hyperintensity in the posterior right temporal lobe and right operculum as seen on series 11, image 12. However, this appearance is not correlated with any asymmetric diffusion weighted signal, and not definitely correlated with any FLAIR and T1 signal alteration in the same areas. There is a small chronic micro hemorrhage in the posterior right temporal lobe (series 9, image 37). No cortical encephalomalacia or other chronic cerebral blood products identified. Deep gray matter nuclei, brainstem and  cerebellum appear normal. Vascular: Major intracranial vascular flow voids appear normal. Skull and upper cervical spine: Negative. Visualized bone marrow signal is within normal limits. Sinuses/Orbits: Negative orbits soft tissues. Visualized paranasal sinuses and mastoids are stable and well pneumatized. Other: Visible internal auditory structures appear normal. Negative scalp soft tissues. MRA HEAD FINDINGS Antegrade flow in the posterior circulation. Fairly codominant distal vertebral arteries. Both PICA origins are patent. Patent vertebrobasilar junction. No distal vertebral stenosis. No basilar stenosis. Normal SCA and PCA origins, fetal type right PCA. Left posterior communicating artery is present. Normal bilateral PCA branches. Antegrade flow in both ICA siphons. No siphon stenosis. Ophthalmic and posterior communicating artery origins appear normal. Normal carotid termini, MCA and ACA origins. The left ACA is mildly dominant. Mildly tortuous ACA A1 segments. Diminutive or absent anterior communicating artery. Visualized ACA branches are within normal limits. MCA M1 segments, MCA bifurcations, and visualized  MCA branches are within normal limits. IMPRESSION: 1.  No acute intracranial abnormality. 2. Subtle T2 signal asymmetry in the posterior right temporal lobe is felt to be artifactual. There is mild for age nonspecific cerebral white matter signal changes, and rare chronic micro hemorrhages, which likely are chronic small vessel disease related. 3.  Negative intracranial MRA. Electronically Signed   By: Genevie Ann M.D.   On: 03/13/2016 11:15   Mr Cervical Spine W Wo Contrast  Result Date: 03/14/2016 CLINICAL DATA:  Initial evaluation for left-sided weakness. EXAM: MRI CERVICAL SPINE WITHOUT AND WITH CONTRAST TECHNIQUE: Multiplanar and multiecho pulse sequences of the cervical spine, to include the craniocervical junction and cervicothoracic junction, were obtained without and with intravenous contrast.  CONTRAST:  57mL MULTIHANCE GADOBENATE DIMEGLUMINE 529 MG/ML IV SOLN COMPARISON:  None available. FINDINGS: Alignment: Straightening with slight reversal of the normal cervical lordosis. No listhesis. Vertebrae: Vertebral body heights are maintained. No evidence for acute or chronic fracture. Signal intensity within the vertebral body bone marrow is normal. No abnormal enhancement. No focal osseous lesions. Cord: Signal intensity within the cervical spinal cord is normal. No abnormal enhancement. Posterior Fossa, vertebral arteries, paraspinal tissues: Visualized brain and posterior fossa within normal limits. Craniocervical junction normal. Paraspinous soft tissues within normal limits. Normal intravascular flow voids present within the vertebral arteries bilaterally. Disc levels: C2-C3: Mild diffuse degenerative disc bulge with bilateral uncovertebral spurring. No significant canal stenosis or foraminal encroachment. C3-C4: No significant disc bulge. Mild bilateral uncovertebral hypertrophy, slightly greater on the left. No significant stenosis. C4-C5: Mild diffuse disc bulge with intervertebral disc space narrowing and bilateral uncovertebral spurring. No significant canal or foraminal stenosis. C5-C6: Mild diffuse disc bulge with intervertebral disc space narrowing and bilateral uncovertebral spurring. Left-sided facet arthrosis. There is resultant moderate left foraminal narrowing. Central canal and right neural foramen remain widely patent. C6-C7: Mild diffuse disc bulge with bilateral uncovertebral spurring. Bulging disc mildly flattens the ventral thecal sac without significant canal stenosis. Minimal bilateral foraminal encroachment, slightly greater on the left. C7-T1:  Negative. Visualized upper thoracic spine within normal limits. IMPRESSION: 1. Degenerative disc bulge with uncovertebral spurring and facet disease at C5-6 with resultant moderate left foraminal stenosis. 2. Additional fairly mild  multilevel degenerative spondylolysis as above. No other significant foraminal encroachment identified. Central canal is widely patent. No findings to explain patient's symptoms identified. Electronically Signed   By: Jeannine Boga M.D.   On: 03/14/2016 20:25   Dg Abd Portable 1v  Result Date: 03/14/2016 CLINICAL DATA:  Nausea vomiting no bowel movement EXAM: PORTABLE ABDOMEN - 1 VIEW COMPARISON:  03/14/2016, 08/18/2004 FINDINGS: AP supine portable view of the abdomen. Mild left basilar atelectasis. Centrally dilated loop of what is suspected to be small bowel, measuring up to 3 cm. Moderate stool in the colon and rectum. Multiple calcifications in the pelvis are probably phleboliths. Pubic symphysis sclerosis. IMPRESSION: Possible dilated loop of small bowel centrally within the abdomen. If symptoms are suggestive of obstruction, CT could be obtained for further evaluation. Electronically Signed   By: Donavan Foil M.D.   On: 03/14/2016 20:57   Mr Jodene Nam Head/brain [id number] Cm  Result Date: 03/13/2016 CLINICAL DATA:  60 year old female with several days of left extremity weakness. Chest pain and palpitations. Hypertension. Initial encounter. EXAM: MRI HEAD WITHOUT CONTRAST MRA HEAD WITHOUT CONTRAST TECHNIQUE: Multiplanar, multiecho pulse sequences of the brain and surrounding structures were obtained without intravenous contrast. Angiographic images of the head were obtained using MRA technique without contrast. COMPARISON:  Head CT without contrast 0050 hours today. FINDINGS: MRI HEAD FINDINGS Brain: Cerebral volume is within normal limits for age. No restricted diffusion to suggest acute infarction. No midline shift, mass effect, evidence of mass lesion, ventriculomegaly, extra-axial collection or acute intracranial hemorrhage. Cervicomedullary junction and pituitary are within normal limits. Scattered small nonspecific foci of cerebral white matter T2 and FLAIR hyperintensity, primarily in the anterior  frontal lobes and mostly subcortical. There is mild motion artifact, and T2 propeller (rapid acquisition (sequences had to be utilized. There is questionable mild asymmetric gyral thickening and T2 hyperintensity in the posterior right temporal lobe and right operculum as seen on series 11, image 12. However, this appearance is not correlated with any asymmetric diffusion weighted signal, and not definitely correlated with any FLAIR and T1 signal alteration in the same areas. There is a small chronic micro hemorrhage in the posterior right temporal lobe (series 9, image 37). No cortical encephalomalacia or other chronic cerebral blood products identified. Deep gray matter nuclei, brainstem and cerebellum appear normal. Vascular: Major intracranial vascular flow voids appear normal. Skull and upper cervical spine: Negative. Visualized bone marrow signal is within normal limits. Sinuses/Orbits: Negative orbits soft tissues. Visualized paranasal sinuses and mastoids are stable and well pneumatized. Other: Visible internal auditory structures appear normal. Negative scalp soft tissues. MRA HEAD FINDINGS Antegrade flow in the posterior circulation. Fairly codominant distal vertebral arteries. Both PICA origins are patent. Patent vertebrobasilar junction. No distal vertebral stenosis. No basilar stenosis. Normal SCA and PCA origins, fetal type right PCA. Left posterior communicating artery is present. Normal bilateral PCA branches. Antegrade flow in both ICA siphons. No siphon stenosis. Ophthalmic and posterior communicating artery origins appear normal. Normal carotid termini, MCA and ACA origins. The left ACA is mildly dominant. Mildly tortuous ACA A1 segments. Diminutive or absent anterior communicating artery. Visualized ACA branches are within normal limits. MCA M1 segments, MCA bifurcations, and visualized MCA branches are within normal limits. IMPRESSION: 1.  No acute intracranial abnormality. 2. Subtle T2 signal  asymmetry in the posterior right temporal lobe is felt to be artifactual. There is mild for age nonspecific cerebral white matter signal changes, and rare chronic micro hemorrhages, which likely are chronic small vessel disease related. 3.  Negative intracranial MRA. Electronically Signed   By: Genevie Ann M.D.   On: 03/13/2016 11:15    Microbiology: No results found for this or any previous visit (from the past 240 hour(s)).   Labs: CBC:  Recent Labs Lab 03/13/16 0357 03/13/16 0405 03/14/16 0739  WBC 7.6  --  3.3*  NEUTROABS 4.9  --   --   HGB 12.5 13.6 14.5  HCT 37.0 40.0 42.0  MCV 88.1  --  86.4  PLT 261  --  0000000   Basic Metabolic Panel:  Recent Labs Lab 03/13/16 0357 03/13/16 0405 03/13/16 1451 03/14/16 0739  NA 135 137 136 137  K 4.8 6.0* 2.8* 3.9  CL 101 99* 101 101  CO2 27  --  28 26  GLUCOSE 117* 115* 127* 102*  BUN 13 16 10 10   CREATININE 0.87 0.90 0.84 0.75  CALCIUM 9.2  --  8.9 9.4   Liver Function Tests:  Recent Labs Lab 03/13/16 0357  AST 29  ALT 28  ALKPHOS 70  BILITOT 1.2  PROT 8.1  ALBUMIN 4.2    Recent Labs Lab 03/13/16 0357  LIPASE 29   No results for input(s): AMMONIA in the last 168 hours. Cardiac Enzymes:  Recent Labs Lab  03/13/16 0357  TROPONINI <0.03   BNP (last 3 results) No results for input(s): BNP in the last 8760 hours. CBG: No results for input(s): GLUCAP in the last 168 hours. Time spent: 30 minutes  Signed:  Berle Mull  [facility] 03/15/2016  6:15 AM

## 2016-03-21 NOTE — Progress Notes (Signed)
Discharge Summary  Patient Details  Name: Hannah Cooper MRN: [id number] Date of Birth: 01-30-1956 Referring Provider:     Number of Visits: 36  Reason for Discharge:  Patient reached a stable level of exercise. Patient independent in their exercise.  Smoking History:  History  Smoking Status  . Never Smoker  Smokeless Tobacco  . Never Used    Diagnosis:  No diagnosis found.  ADL UCSD:   Initial Exercise Prescription:   Discharge Exercise Prescription (Final Exercise Prescription Changes):   Functional Capacity:   Psychological, QOL, Others - Outcomes: PHQ 2/9: Depression screen PHQ 2/9 03/31/2015  Decreased Interest 0  Down, Depressed, Hopeless 1  PHQ - 2 Score 1  Altered sleeping 0  Tired, decreased energy 2  Change in appetite 1  Feeling bad or failure about yourself  1  Trouble concentrating 0  Moving slowly or fidgety/restless 0  Suicidal thoughts 0  PHQ-9 Score 5  Difficult doing work/chores Somewhat difficult    Quality of Life:   Personal Goals: Goals established at orientation with interventions provided to work toward goal.    Personal Goals Discharge:   Nutrition & Weight - Outcomes:    Nutrition:   Nutrition Discharge:   Education Questionnaire Score:   Goals reviewed with patient; copy given to patient. Patient graduated from Pulmonary Rehabilitation today on 12/03/11 after completing 36 sessions. He achieved LTG of 30 minutes of aerobic exercise at Max Met level of 3.68. All patients vitals are WNL. Patient has met with dietician. Discharge instruction has been reviewed in detail and patient stated an understanding of material given. This patient graduated before notes where being put in ITP in EPIC so all of her ITP notes will be scanned in under the media tab. Patient plans to exercise at home.Cardiac Rehab staff will make f/u calls at 1 month, 6 months, and 1 year. Patient had no complaints of any abnormal S/S or pain on  their exit visit.

## 2016-03-21 NOTE — Addendum Note (Signed)
Encounter addended by: Gean Maidens on: 03/21/2016  2:26 PM<BR>    Actions taken: Sign clinical note

## 2016-03-24 ENCOUNTER — Encounter (HOSPITAL_COMMUNITY): Payer: Self-pay

## 2016-03-24 ENCOUNTER — Ambulatory Visit (HOSPITAL_COMMUNITY): Payer: Medicaid Other | Attending: Internal Medicine

## 2016-03-24 ENCOUNTER — Ambulatory Visit (HOSPITAL_COMMUNITY): Payer: Medicaid Other | Admitting: Physical Therapy

## 2016-03-24 DIAGNOSIS — M25512 Pain in left shoulder: Secondary | ICD-10-CM | POA: Insufficient documentation

## 2016-03-24 DIAGNOSIS — M79605 Pain in left leg: Secondary | ICD-10-CM

## 2016-03-24 DIAGNOSIS — R262 Difficulty in walking, not elsewhere classified: Secondary | ICD-10-CM | POA: Insufficient documentation

## 2016-03-24 DIAGNOSIS — R278 Other lack of coordination: Secondary | ICD-10-CM | POA: Diagnosis present

## 2016-03-24 DIAGNOSIS — R29898 Other symptoms and signs involving the musculoskeletal system: Secondary | ICD-10-CM | POA: Insufficient documentation

## 2016-03-24 DIAGNOSIS — M6281 Muscle weakness (generalized): Secondary | ICD-10-CM

## 2016-03-24 NOTE — Therapy (Signed)
[place], [place], [address] Phone: [phone]   Fax:  [phone]  Occupational Therapy Evaluation  Hannah Cooper Details  Name: Hannah Cooper MRN: [id number] Date of Birth: [birth year] Referring Provider: Dr. Berle Mull  Encounter Date: 03/24/2016      OT End of Session - 03/24/16 1154    Visit Number 1   Number of Visits 1   Authorization Type Medicaid - 1 eval approved this calender year.   OT Start Time [phone]   OT Stop Time 1035   OT Time Calculation (min) 45 min   Activity Tolerance Hannah Cooper tolerated treatment well   Behavior During Therapy WFL for tasks assessed/performed      Past Medical History:  Diagnosis Date  . Arthritis   . CHF (congestive heart failure) ([place])   . COPD (chronic obstructive pulmonary disease) ([place])   . Hyperlipidemia   . Hypertension   . Meningitis     Past Surgical History:  Procedure Laterality Date  . ABDOMINAL HYSTERECTOMY    . LEFT HEART CATHETERIZATION WITH CORONARY ANGIOGRAM N/A 09/05/2012   Procedure: LEFT HEART CATHETERIZATION WITH CORONARY ANGIOGRAM;  Surgeon: Wellington Hampshire, MD;  Location: [place] CATH LAB;  Service: Cardiovascular;  Laterality: N/A;  . THYROID SURGERY  2002    There were no vitals filed for this visit.      Subjective Assessment - 03/24/16 0958    Subjective  S: The pain hurts so bad. I don't like to take pain pills to cover it up.   Pertinent History Hannah Cooper is a 60 y/o female S/P left side weakness and numbness with no known etiology. Pt began to experience symptoms on 03/15/16. Hannah Cooper was transferred from [facility] to [facility]. MRI was negative. Hannah Cooper has an appointment scheduled with a Neurologist on 04/19/16. Dr. Posey Pronto has referred Hannah Cooper to occupational therapy for evaluation and treatment.    Hannah Cooper Stated Goals To be able to function normally again.   Currently in Pain? Yes   Pain Score 9    Pain Location --  The whole  left arm and leg   Pain Orientation Left   Pain Descriptors / Indicators Numbness;Pins and needles;Tender;Constant   Pain Radiating Towards down left side (arm and leg)   Pain Onset 1 to 4 weeks ago   Aggravating Factors  Hurts all time.   Pain Relieving Factors Pain meds   Effect of Pain on Daily Activities Max effect           [facility] OT Assessment - 03/24/16 1137      Assessment   Diagnosis Left side weakness   Referring Provider Dr. Berle Mull   Onset Date 02/18/16   Assessment 04/19/16 - Neurologist appointment   Prior Therapy Pt received acute OT services when admitted at [facility] for current episode.      Precautions   Precautions Fall     Restrictions   Weight Bearing Restrictions No     Balance Screen   Has the Hannah Cooper fallen in the past 6 months Yes   How many times? 3   Has the Hannah Cooper had a decrease in activity level because of a fear of falling?  Yes   Is the Hannah Cooper reluctant to leave their home because of a fear of falling?  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 Frequently volunteers. Has 3 young daughters at home that she cares for.     ADL   ADL comments Hannah Cooper is unable to complete any daily tasks with LUE at this time due to pain and weakness.     Mobility   Mobility Status History of falls     Written Expression   Dominant Hand Right     Vision - History   Baseline Vision No visual deficits     Cognition   Overall Cognitive Status Within Functional Limits for tasks assessed     Sensation   Light Touch Impaired Detail   Light Touch Impaired Details Impaired LUE   Stereognosis Impaired by  gross assessment   Hot/Cold Impaired by gross assessment   Proprioception Impaired by gross assessment     Coordination   Gross Motor Movements are Fluid and Coordinated No   Fine Motor Movements are Fluid and Coordinated No   Coordination and Movement Description Slow and taxing.   Finger Nose Finger Test Slow   Tremors Tremor noted in both left and right hand when attempting motor movements. L>R.     Praxis   Praxis --     ROM / Strength   AROM / PROM / Strength AROM;PROM;Strength     AROM   Overall AROM  Deficits   Overall AROM Comments Seated: Hannah Cooper able to complete 25% range of left shoulder flexion.. Full elbow flexion although shoulder internally rotates. Decreased wrist flexion. Full A/ROM for wrist extension. Hannah Cooper is able to make a full fist as well as full finger extension. Finger abduction and adduction intact.     PROM   Overall PROM  Deficits   Overall PROM Comments Hannah Cooper only able to tolerate shoulder flexion to 50% range when seated.     Strength   Overall Strength Deficits   Strength Assessment Site Shoulder;Elbow;Forearm;Wrist;Hand   Right/Left Shoulder Left   Left Shoulder Flexion 3-/5   Right/Left Elbow Left   Left Elbow Flexion 3/5   Left Elbow Extension 3/5   Right/Left Forearm Left   Left Forearm Pronation 3/5   Left Forearm Supination 3/5   Right/Left Wrist Left   Left Wrist Flexion 3-/5   Left Wrist Extension 3/5   Right/Left hand Right;Left   Right Hand Grip (lbs) 61   Right Hand Lateral Pinch 17 lbs   Right Hand 3 Point Pinch 6 lbs   Left Hand Grip (lbs) 10   Left Hand Lateral Pinch 6 lbs   Left Hand 3 Point Pinch 1 lbs                         OT Education - 03/24/16 1152    Education provided Yes   Education Details Education given regarding Desensitivity techniques, table slides, yellow theraputty exercises, and coordination tasks. At this time, with no known etiology Hannah Cooper was educated that the successfullnes of  the HEP is undetermined.    Person(s) Educated Hannah Cooper   Methods Explanation;Demonstration;Verbal cues;Handout   Comprehension Verbalized understanding          OT Short Term Goals - 03/24/16 1158      OT SHORT TERM GOAL #1   Title Hannah Cooper will be educated and independent with HEP to hopefully assist with  UE sensitivity and increase ROM and use of LUE.   Time 1   Period Days   Status Achieved                  Plan - 03/24/16 1155    Clinical Impression Statement A: Hannah Cooper is a 60 y/o female S/P left side weakness and numbness causing increased pain and decreased strength and ROM in LUE resulting in difficulty completing daily tasks.  As Medicaid only covers evaluation due to unknown diagnosis Hannah Cooper will be given a HEP and will return to clinic after appointment with Neurologist.    Rehab Potential Good   OT Frequency One time visit   OT Treatment/Interventions Hannah Cooper/family education   Plan P: 1 time evaluation at this time. Hannah Cooper will return to Outpatient clinic in 07-23-16 after visit with Nuerologist with a diagnosis to start treatment.   Consulted and Agree with Plan of Care Hannah Cooper      Hannah Cooper will benefit from skilled therapeutic intervention in order to improve the following deficits and impairments:  Pain, Impaired UE functional use, Decreased coordination, Decreased range of motion, Impaired sensation, Decreased activity tolerance, Decreased knowledge of use of DME, Decreased strength, Decreased endurance  Visit Diagnosis: Other symptoms and signs involving the musculoskeletal system - Plan: Ot plan of care cert/re-cert  Acute pain of left shoulder - Plan: Ot plan of care cert/re-cert  Other lack of coordination - Plan: Ot plan of care cert/re-cert      G-Codes - [id number] 24-Jul-1206    Functional Assessment Tool Used clinical judgement   Functional Limitation Carrying, moving and handling objects   Carrying, Moving and Handling Objects Current Status [phone])  100 percent impaired, limited or restricted   Carrying, Moving and Handling Objects Goal Status [id number]) 100 percent impaired, limited or restricted   Carrying, Moving and Handling Objects Discharge Status [phone]) 100 percent impaired, limited or restricted      Problem List Hannah Cooper Active Problem List   Diagnosis Date Noted  . Left-sided weakness 03/13/2016  . Cerebrovascular accident (CVA) ([place])   . Palpitations   . COPD (chronic obstructive pulmonary disease) ([place]) 08/20/2014  . Dyspnea 12/19/2013  . Chronic diastolic CHF (congestive heart failure) ([place]) 07/05/2013  . Lower extremity edema 07/05/2013  . Hyperlipidemia   . Chest pain 07/04/2013  . Hypokalemia 09/04/2012  . Precordial pain 09/04/2012  . Viral meningitis 01/22/2011  . Vomiting 01/22/2011  . PNEUMONIA, LEFT LOWER LOBE 10/10/2006  . DISEASE, ACUTE BRONCHOSPASM 10/10/2006  . Essential hypertension 05/23/2006  . OSTEOARTHRITIS 05/23/2006   Ailene Ravel, OTR/L,CBIS  [phone]  03/24/2016, 12:11 PM  [place] [address] [place], [place], [address] Phone: [phone]   Fax:  [phone]  Name: CEONNA MECKEL MRN: [id number] Date of Birth: [birth year]

## 2016-03-24 NOTE — Patient Instructions (Signed)
PERFORM DESENSITIZATION TECHNIQUES THAT OT GAVE YOU TO YOUR L LE     WEIGHT SHIFT - LATERAL (INSIDE YOUR WALKER)  While in a standing position and knees partially bent, slowly shift your body weight side-to-side.   Repeat 10 times, 3 times per day. Perform in front of a stable surface and inside your walker for safety.

## 2016-03-24 NOTE — Therapy (Signed)
[place], [place], [address] Phone: [phone]   Fax:  [phone]  Physical Therapy Evaluation  Patient Details  Name: CAIAH MCGLATHERY MRN: [id number] Date of Birth: [birth year] Referring Provider: Lavina Hamman   Encounter Date: 03/24/2016      PT End of Session - 03/24/16 1228    Visit Number 1   Number of Visits 1   Authorization Type Medicaid   Authorization Time Period 03/24/16 to 03/24/16   Authorization - Visit Number 1   Authorization - Number of Visits 1   PT Start Time 1037  overtime wth OT eval    PT Stop Time 1125   PT Time Calculation (min) 48 min   Activity Tolerance Patient tolerated treatment well   Behavior During Therapy [facility] for tasks assessed/performed      Past Medical History:  Diagnosis Date  . Arthritis   . CHF (congestive heart failure) ([place])   . COPD (chronic obstructive pulmonary disease) ([place])   . Hyperlipidemia   . Hypertension   . Meningitis     Past Surgical History:  Procedure Laterality Date  . ABDOMINAL HYSTERECTOMY    . LEFT HEART CATHETERIZATION WITH CORONARY ANGIOGRAM N/A 09/05/2012   Procedure: LEFT HEART CATHETERIZATION WITH CORONARY ANGIOGRAM;  Surgeon: Wellington Hampshire, MD;  Location: [place] CATH LAB;  Service: Cardiovascular;  Laterality: N/A;  . THYROID SURGERY  2002    There were no vitals filed for this visit.       Subjective Assessment - 03/24/16 1042    Subjective Patient states she started feeling bad, but assumed that it was just because she was doing too much. She was fatigued, but then one day her L UE and LE started hurting and going numb. This went on  for a few weeks. The week of Thanksgiving it became constant, she had to sit to prepare her thanksgiving dinner. She had to lean to stand up, then fell and however her LE continued to collapse when she tried to stand on it. She was able to get up. Friday after thanksgiving she felt horrible and was  not even able to get up to do PLOF. By Saturday she had to crawl into the shower. She went to the MD and differential diagnoses included possibe MI or CVA, however all tests came back negative. She is going to a neurologist next month.    Pertinent History HTN, CHF, COPD, meningitis (recurred twice)   Currently in Pain? Yes   Pain Score 9    Pain Location Other (Comment)  L UE/L LE    Pain Orientation Left   Pain Descriptors / Indicators Numbness;Pins and needles;Tender;Constant   Pain Type Chronic pain   Pain Radiating Towards down L side arm and leg    Pain Onset 1 to 4 weeks ago   Pain Frequency Constant   Aggravating Factors  hurts all the time    Pain Relieving Factors pain meds    Effect of Pain on Daily Activities severe impact             [facility] PT Assessment - 03/24/16 0001      Assessment   Medical Diagnosis L sided muscle weakness    Referring Provider Lavina Hamman    Onset Date/Surgical Date 03/15/16   Next MD Visit Neurologist in January      Precautions   Precaution Comments fall risk, unclear diagnosis neurologically      Restrictions  Weight Bearing Restrictions No     Balance Screen   Has the patient fallen in the past 6 months Yes   How many times? 1   Has the patient had a decrease in activity level because of a fear of falling?  Yes   Is the patient reluctant to leave their home because of a fear of falling?  No     Observation/Other Assessments   Observations clonus appears negative B      Sensation   Light Touch Impaired by gross assessment   Stereognosis Impaired by gross assessment   Hot/Cold Impaired by gross assessment   Proprioception Impaired by gross assessment   Additional Comments impairment of light tough, deep pressure, and sharps testing L LE grossly all dermatomes      Strength   Right Hip Flexion 4/5   Right Hip ABduction 5/5   Left Hip Flexion 2/5   Left Hip ABduction 2-/5   Right Knee Flexion 4/5   Right Knee Extension  5/5   Left Knee Flexion 2/5   Left Knee Extension 2/5   Right Ankle Dorsiflexion 5/5   Left Ankle Dorsiflexion 3-/5     Ambulation/Gait   Gait Comments hemiparetic gait pattern with little ankle DF/knee flexion or extension/hip motion L LE, drags L LE along floor. General unsteadiness.                            PT Education - 03/24/16 1226    Education provided Yes   Education Details unable to give specific details regarding lack of prognosis due to no specific neuro diagnosis from MD; general HEP; discussed fatigue, energy management, activity recommendations as well; continue using walker. Return in january for re-eval/treatments after formal diagnosis is obtained from neurologist.    Person(s) Educated Patient   Methods Explanation;Demonstration;Handout   Comprehension Verbalized understanding          PT Short Term Goals - 03/24/16 1231      PT SHORT TERM GOAL #1   Title Patient to be independent in general HEP and energy/fatigue management strategies in order to maintain and perhaps improve status until she is able to be picked up by DPT after formal diagnosis from neurologist    Time 1   Period Days   Status Achieved           PT Long Term Goals - 03/24/16 1232      PT LONG TERM GOAL #1   Title Long term goals not appropraite at this time                Plan - 03/24/16 1229    Clinical Impression Statement Patient arrives stating that she began to experience pain and numbness in her L UE/LE shortly before Thanksgiving; on and a few days after Thanksgiving her symptoms exacerbated, and she was unable to use her L UE/LE, even having her L LE collapse and cause her to fall. She reports she received images/lab tests/etc at the hospital however no tests were positive and her providers were not able to identify any cause for her symptoms. Upon examination she reveals functionally impaired gait pattern, severe weakness in L LE and L UE (see OT  evaluation for details), difficulty with functional mobility, unsteadiness, and reduced ability to perform PLOF based activities. Due to insurance limitations (Medicaid), and due to lack of formal diagnosis from MD, DPT unable to provide skilled treatment interventions at this time, however  did discuss HEP and advised patient to return at beginning of year with formal diagnosis that can be submitted to get treatment sessions from [facility]. Also had patient speak to front desk staff for options for financial assistance from [facility] to allow her to continue with PT next year as well. One time visit at this time, however plan to re-eval and initiate skilled PT services at the beginning of the year.    Rehab Potential Fair   Clinical Impairments Affecting Rehab Potential unclear prognosis due to lack of formal diagnosis    PT Frequency One time visit   PT Treatment/Interventions ADLs/Self Care Home Management   PT Next Visit Plan 1 visit for now, to return to January for re-eval with form diagnosis from neurologist    Consulted and Agree with Plan of Care Patient      Patient will benefit from skilled therapeutic intervention in order to improve the following deficits and impairments:  Abnormal gait, Improper body mechanics, Impaired sensation, Pain, Decreased coordination, Decreased mobility, Postural dysfunction, Impaired tone, Decreased activity tolerance, Decreased strength, Decreased balance, Difficulty walking  Visit Diagnosis: Pain in left leg - Plan: PT plan of care cert/re-cert  Muscle weakness (generalized) - Plan: PT plan of care cert/re-cert  Difficulty in walking, not elsewhere classified - Plan: PT plan of care cert/re-cert  Other symptoms and signs involving the musculoskeletal system - Plan: PT plan of care cert/re-cert     Problem List Patient Active Problem List   Diagnosis Date Noted  . Left-sided weakness 03/13/2016  . Cerebrovascular accident (CVA) ([place])   . Palpitations    . COPD (chronic obstructive pulmonary disease) ([place]) 08/20/2014  . Dyspnea 12/19/2013  . Chronic diastolic CHF (congestive heart failure) ([place]) 07/05/2013  . Lower extremity edema 07/05/2013  . Hyperlipidemia   . Chest pain 07/04/2013  . Hypokalemia 09/04/2012  . Precordial pain 09/04/2012  . Viral meningitis 01/22/2011  . Vomiting 01/22/2011  . PNEUMONIA, LEFT LOWER LOBE 10/10/2006  . DISEASE, ACUTE BRONCHOSPASM 10/10/2006  . Essential hypertension 05/23/2006  . OSTEOARTHRITIS 05/23/2006    Deniece Ree PT, DPT [place] [address] [place], [place], [address] Phone: [phone]   Fax:  [phone]  Name: YARED HERPIN MRN: [id number] Date of Birth: 02-11-1956

## 2016-03-24 NOTE — Patient Instructions (Signed)
Desensitization Techniques  Perform these exercises ever 2 hours for 15 minute sessions.  Progress to the next exercises when the exercises you are doing become easy.  1)  Using light pressure, rub the various textures along with the hypersensitive area:  A.  Ali Chukson  C.  Wool  G.  Cotton material  D.  Terry cloth  2)  With the same textures use a firmer pressure. 3)  Use a hand held vibrator and massage along the sensitive area. 4)  With a small dowel rod, eraser on a pencil or base of an ink pen tap along the sensitive area. 5)  Use an empty roll-on deodorant bottle to roll along the sensitive area. 6)  Place your hand/forearm in separate containers of the following items:  A.  Sand  D.  Dry lentil beans  B.  Dry Rice  E.  Dry kidney beans  C.  Ball bearings F.  Dry pinto beans   SHOULDER: Flexion On Table   Place hands on table, elbows straight. Move hips away from body. Press hands down into table. Hold _2__ seconds. _10-12__ reps per set, ___ sets per day, ___ days per week  Abduction (Passive)   With arm out to side, resting on table, lower head toward arm, keeping trunk away from table. Hold _2___ seconds. Repeat _10-12___ times. Do ____ sessions per day.  Copyright  VHI. All rights reserved.     Internal Rotation (Assistive)   Seated with elbow bent at right angle and held against side, slide arm on table surface in an inward arc. Repeat _10-12___ times. Do ____ sessions per day. Activity: Use this motion to brush crumbs off the table.  Copyright  VHI. All rights reserved.    Home Exercises Program Theraputty Exercises  Do the following exercises 2-3 times a day using your affected hand.  1. Roll putty into a ball.  2. Make into a pancake.  3. Roll putty into a roll.  4. Pinch along log with first finger and thumb.   5. Make into a ball.  6. Roll it back into a log.   7. Pinch using thumb and side of first  finger.  8. Roll into a ball, then flatten into a pancake.  9. Using your fingers, make putty into a mountain.  10. Roll putty into a ball and squeeze 10 times.  Coordination Activities  Perform the following activities for 15-30 minutes 2-3 times per day with left hand(s).   Rotate ball in fingertips (clockwise and counter-clockwise).  Flip cards 1 at a time as fast as you can.  Deal cards with your thumb (Hold deck in hand and push card off top with thumb).  Rotate card in hand (clockwise and counter-clockwise).  Shuffle cards.  Pick up coins and place in container or coin bank.  Pick up coins and stack.  Pick up coins one at a time until you get 5-10 in your hand, then move coins from palm to fingertips to stack one at a time.  Twirl pen between fingers.

## 2016-04-20 ENCOUNTER — Ambulatory Visit (HOSPITAL_COMMUNITY): Payer: Medicaid Other | Admitting: Physical Therapy

## 2016-04-20 ENCOUNTER — Ambulatory Visit (HOSPITAL_COMMUNITY): Payer: Medicaid Other

## 2016-04-20 ENCOUNTER — Telehealth (HOSPITAL_COMMUNITY): Payer: Self-pay | Admitting: Physical Therapy

## 2016-04-20 NOTE — Telephone Encounter (Signed)
Called patient to touch base- she did go to a neurologist however she does not have a formal diagnosis yet, and her neurologist wants to do more tests on her on January 25th.  Given that due to her specific insurance limitations we cannot formally treat her/submit to her insurance without formal diagnosis, I educated her that the best plan moving forward will be to cancel today's sessions and reschedule after her tests on the 25th when hopefully she will have the diagnosis from this MD.  Also asked her to get new PT/OT orders from neurologist at next appointment.  Will have front desk staff call her to set up these appts.    Deniece Ree PT, DPT [phone]

## 2016-04-26 ENCOUNTER — Telehealth (HOSPITAL_COMMUNITY): Payer: Self-pay | Admitting: Physical Therapy

## 2016-04-26 NOTE — Telephone Encounter (Signed)
L/m requesting pt to call back to schedule Neuro PT/OT apptments.

## 2016-05-10 ENCOUNTER — Ambulatory Visit: Payer: Medicaid Other | Admitting: Cardiology

## 2016-05-13 ENCOUNTER — Encounter: Payer: Self-pay | Admitting: Cardiology

## 2016-05-13 ENCOUNTER — Ambulatory Visit (INDEPENDENT_AMBULATORY_CARE_PROVIDER_SITE_OTHER): Payer: Medicaid Other | Admitting: Cardiology

## 2016-05-13 VITALS — BP 148/90 | HR 73 | Ht 67.0 in | Wt 226.0 lb

## 2016-05-13 DIAGNOSIS — R002 Palpitations: Secondary | ICD-10-CM

## 2016-05-13 NOTE — Progress Notes (Signed)
Clinical Summary Hannah Cooper is a 61 y.o.female seen today for follow up of the following medical problems. This is a focused visit on recent symptoms of palpitations. For more detailed medical history see prevoius clinic notes.    1. Palpitations - started about 2-3 weeks ago. Can occur at any time. Feeling of heart pounding, can feel heart beat when placed hand on her chest. No specific trigger. Can cause fatigue, + SOB, +lightheadness. Can last a few minutes, but can have several episodes in a row. Happens multiple times a day - stopped coffee about 2 weeks, stopped sodas, no tea, no energy drinks, no alcohol.       SH: works at [employer]. She has 3 adopted kids (9,10,11)   Past Medical History:  Diagnosis Date  . Arthritis   . CHF (congestive heart failure) ([place])   . COPD (chronic obstructive pulmonary disease) ([place])   . Hyperlipidemia   . Hypertension   . Meningitis      Allergies  Allergen Reactions  . Other Swelling    Avon lipstick     Current Outpatient Prescriptions  Medication Sig Dispense Refill  . acetaminophen (TYLENOL) 500 MG tablet Take 1,000 mg by mouth every 6 (six) hours as needed for headache (pain).     Marland Kitchen albuterol (PROVENTIL) (2.5 MG/3ML) 0.083% nebulizer solution Take 2.5 mg by nebulization every 6 (six) hours as needed for wheezing or shortness of breath.    . Albuterol Sulfate (PROAIR RESPICLICK) 123XX123 (90 Base) MCG/ACT AEPB Inhale 2 puffs into the lungs every 4 (four) hours as needed (shortness of breath/ wheezing).    Marland Kitchen amLODipine (NORVASC) 10 MG tablet Take 1 tablet (10 mg total) by mouth daily. 90 tablet 3  . aspirin 325 MG tablet Take 1 tablet (325 mg total) by mouth daily. 30 tablet 0  . atorvastatin (LIPITOR) 20 MG tablet Take 20 mg by mouth daily.     . chlorthalidone (HYGROTON) 25 MG tablet Take 1 tablet (25 mg total) by mouth daily. 90 tablet 3  . cycloSPORINE (RESTASIS) 0.05 % ophthalmic emulsion Place 1 drop  into both eyes 2 (two) times daily.     . famotidine (PEPCID) 20 MG tablet Take 1 tablet (20 mg total) by mouth daily. 30 tablet 0  . furosemide (LASIX) 20 MG tablet Take 1 tablet (20 mg total) by mouth daily as needed for fluid or edema. 30 tablet 0  . HYDROcodone-acetaminophen (NORCO/VICODIN) 5-325 MG per tablet Take 1 tablet by mouth every 4 (four) hours as needed for moderate pain. (Patient not taking: Reported on 03/13/2016) 30 tablet 0  . ibuprofen (ADVIL,MOTRIN) 800 MG tablet Take 1 tablet by mouth 2 (two) times daily as needed (back pain).   2  . isosorbide mononitrate (IMDUR) 30 MG 24 hr tablet TAKE 1 TABLET (30 MG TOTAL) BY MOUTH DAILY. 90 tablet 2  . loratadine (CLARITIN) 10 MG tablet Take 10 mg by mouth daily as needed (seasonal allergies).   1  . losartan (COZAAR) 100 MG tablet TAKE ONE TABLET BY MOUTH DAILY FOR BLOOD PRESSURE  0  . meloxicam (MOBIC) 7.5 MG tablet Take 7.5 mg by mouth daily at 12 noon.   0  . methocarbamol (ROBAXIN) 500 MG tablet Take 1 tablet (500 mg total) by mouth every 8 (eight) hours as needed for muscle spasms. 30 tablet 0  . nitroGLYCERIN (NITROSTAT) 0.4 MG SL tablet Place 1 tablet (0.4 mg total) under the tongue every 5 (five)  minutes as needed for chest pain. 25 tablet 6  . ondansetron (ZOFRAN-ODT) 4 MG disintegrating tablet Take 1 tablet (4 mg total) by mouth every 8 (eight) hours as needed for nausea or vomiting. 20 tablet 0  . polyethylene glycol (MIRALAX / GLYCOLAX) packet Take 17 g by mouth daily. 14 each 0  . potassium chloride (MICRO-K) 10 MEQ CR capsule TAKE ONE CAPSULE BY MOUTH EVERY DAY AS NEEDED (Patient taking differently: TAKE ONE CAPSULE BY MOUTH TWICE DAILY AS NEEDED FOR CRAMPS) 30 capsule 3  . senna-docusate (SENOKOT-S) 8.6-50 MG tablet Take 1 tablet by mouth at bedtime as needed for mild constipation. 30 tablet 0  . spironolactone (ALDACTONE) 25 MG tablet Take 1 tablet (25 mg total) by mouth 2 (two) times daily. 180 tablet 3  .  umeclidinium-vilanterol (ANORO ELLIPTA) 62.5-25 MCG/INH AEPB Inhale 1 puff into the lungs daily as needed (shortness of breath/ wheezing).     No current facility-administered medications for this visit.      Past Surgical History:  Procedure Laterality Date  . ABDOMINAL HYSTERECTOMY    . LEFT HEART CATHETERIZATION WITH CORONARY ANGIOGRAM N/A 09/05/2012   Procedure: LEFT HEART CATHETERIZATION WITH CORONARY ANGIOGRAM;  Surgeon: Wellington Hampshire, MD;  Location: [facility];  Service: Cardiovascular;  Laterality: N/A;  . THYROID SURGERY  2002     Allergies  Allergen Reactions  . Other Swelling    Avon lipstick      Family History  Problem Relation Age of Onset  . Heart attack Mother 42  . Heart attack Sister 46     Social History [name] reports that she has never smoked. She has never used smokeless tobacco. [name] reports that she does not drink alcohol.   Review of Systems CONSTITUTIONAL: No weight loss, fever, chills, weakness or fatigue.  HEENT: Eyes: No visual loss, blurred vision, double vision or yellow sclerae.No hearing loss, sneezing, congestion, runny nose or sore throat.  SKIN: No rash or itching.  CARDIOVASCULAR: per hpi RESPIRATORY: No shortness of breath, cough or sputum.  GASTROINTESTINAL: No anorexia, nausea, vomiting or diarrhea. No abdominal pain or blood.  GENITOURINARY: No burning on urination, no polyuria NEUROLOGICAL: No headache, dizziness, syncope, paralysis, ataxia, numbness or tingling in the extremities. No change in bowel or bladder control.  MUSCULOSKELETAL: No muscle, back pain, joint pain or stiffness.  LYMPHATICS: No enlarged nodes. No history of splenectomy.  PSYCHIATRIC: No history of depression or anxiety.  ENDOCRINOLOGIC: No reports of sweating, cold or heat intolerance. No polyuria or polydipsia.  Marland Kitchen   Physical Examination Vitals:   05/13/16 0844  BP: (!) 148/90  Pulse: 73   Vitals:   05/13/16 0844  Weight: 226 lb  (102.5 kg)  Height: 5\' 7"  (1.702 m)    Gen: resting comfortably, no acute distress HEENT: no scleral icterus, pupils equal round and reactive, no palptable cervical adenopathy,  CV: RRR,no m/r/g, no jvd Resp: Clear to auscultation bilaterally GI: abdomen is soft, non-tender, non-distended, normal bowel sounds, no hepatosplenomegaly MSK: extremities are warm, no edema.  Skin: warm, no rash Neuro:  no focal deficits Psych: appropriate affect   Diagnostic Studies 08/2012 cath Hemodynamics: AO: 164/82 mmHg LV: 169/14 mmHg LVEDP: 24 mmHg  Coronary angiography: Coronary dominance: Right   Left Main: Normal  Left Anterior Descending (LAD): Normal in size with no significant disease.  1st diagonal (D1): Small in size with minor irregularities.  2nd diagonal (D2): Normal in size with no significant disease.  3rd diagonal (D3): Normal  in size with no significant disease.  Circumflex (LCx): Normal in size and nondominant. The vessel has no significant disease.  1st obtuse marginal: Small in size with no significant disease.  2nd obtuse marginal: Small in size with no significant disease.  3rd obtuse marginal: Large in size with no significant disease.  Right Coronary Artery: Normal in size and dominant. The vessel has no significant disease.  posterior descending artery: Normal in size with no significant disease.  posterior lateral branchs: Normal in size with no significant disease.  Left ventriculography: Left ventricular systolic function is normal , LVEF is estimated at 60-65% %, there is no significant mitral regurgitation   Final Conclusions:  1.Normal coronary arteries. 2. Normal LV systolic function. 3. Moderately elevated left ventricular end-diastolic pressure likely due to diastolic heart failure.   06/2013 echo Study Conclusions  - Procedure narrative: Transthoracic echocardiography. Image quality was suboptimal. The study  was technically difficult, as a result of poor sound wave transmission and restricted patient mobility. - Left ventricle: The cavity size was normal. Wall thickness was increased in a pattern of mild to moderate LVH. Diastolic dysfunction noted, grade indeterminant. Systolic function was normal. The estimated ejection fraction was in the range of 55% to 60%. Wall motion was normal; there were no regional wall motion abnormalities.   Jan 2017 Event monitor: no arrhythmias  06/2014 CPX Conclusion: Exercise testing with gas exchange demonstrates a mild-moderately reduced functional capacity when compared to matched sedentary norms. There was an in-adequate HR response to the exercise response and flat O2 pulse. This high HR reserve was likely high due to pulmonary limitations based on restrictive spirometry and RR 45 at peak exercise and reaching maximum ventilatory limits. However, it could have easily been due to poor effort. The sub-maximal effort is clouding interpretation of the test. Nevertheless, correlation of resting spirometry test Showing restriction with outpatient pulmonary function test is warranted     Assessment and Plan  1. Palpitations - EKG in clinic shows NSR - we will obtain 7 day event monitor to further evaluate - check electrolytes, TSH    F/u 3 weeks. Consider trial of beta blocker or CCB pending monitor results      Arnoldo Lenis, M.D.

## 2016-05-13 NOTE — Patient Instructions (Signed)
Your physician recommends that you schedule a follow-up appointment in: 3 weeks   Your physician has recommended that you wear an event monitor for 7 days. Event monitors are medical devices that record the heart's electrical activity. Doctors most often [place] these monitors to diagnose arrhythmias. Arrhythmias are problems with the speed or rhythm of the heartbeat. The monitor is a small, portable device. You can wear one while you do your normal daily activities. This is usually used to diagnose what is causing palpitations/syncope (passing out).   Your physician recommends that you return for lab work in: today, [facility]      Your physician recommends that you continue on your current medications as directed. Please refer to the Current Medication list given to you today.      Thank you for choosing [facility] !

## 2016-05-17 ENCOUNTER — Encounter (HOSPITAL_COMMUNITY): Payer: Medicaid Other

## 2016-05-17 ENCOUNTER — Ambulatory Visit (HOSPITAL_COMMUNITY): Payer: Medicaid Other | Admitting: Physical Therapy

## 2016-05-18 ENCOUNTER — Ambulatory Visit (INDEPENDENT_AMBULATORY_CARE_PROVIDER_SITE_OTHER): Payer: Medicaid Other

## 2016-05-18 ENCOUNTER — Other Ambulatory Visit (HOSPITAL_COMMUNITY)
Admission: RE | Admit: 2016-05-18 | Discharge: 2016-05-18 | Disposition: A | Discharge status: Home / Self Care | Payer: Medicaid Other | Source: Ambulatory Visit | Attending: Cardiology | Admitting: Cardiology

## 2016-05-18 DIAGNOSIS — R002 Palpitations: Secondary | ICD-10-CM | POA: Insufficient documentation

## 2016-05-18 LAB — BASIC METABOLIC PANEL
ANION GAP: 5 (ref 5–15)
BUN: 9 mg/dL (ref 6–20)
CHLORIDE: 103 mmol/L (ref 101–111)
CO2: 28 mmol/L (ref 22–32)
Calcium: 8.7 mg/dL — ABNORMAL LOW (ref 8.9–10.3)
Creatinine, Ser: 0.78 mg/dL (ref 0.44–1.00)
GFR calc Af Amer: >60 mL/min (ref >60)
GLUCOSE: 100 mg/dL — AB (ref 65–99)
POTASSIUM: 3.2 mmol/L — AB (ref 3.5–5.1)
Sodium: 136 mmol/L (ref 135–145)

## 2016-05-18 LAB — TSH: TSH: 2.15 u[IU]/mL (ref 0.350–4.500)

## 2016-05-18 LAB — MAGNESIUM: MAGNESIUM: 1.9 mg/dL (ref 1.7–2.4)

## 2016-05-20 ENCOUNTER — Telehealth: Payer: Self-pay

## 2016-05-20 MED ORDER — POTASSIUM CHLORIDE ER 10 MEQ PO TBCR: EXTENDED_RELEASE_TABLET | ORAL | 3 refills | Status: DC

## 2016-05-20 NOTE — Telephone Encounter (Signed)
Pt verified K+ dose is 10 meq BID,(her sister had passed away and she had been using her potassium, as last refill from pharmacy was Oct 2017 ) she will take 20 meq BID x 4 days and then go back to 10 meq BID, e-scribed new rx to [facility]

## 2016-05-20 NOTE — Telephone Encounter (Signed)
-----   Message from [place] sent at 05/20/2016  2:57 PM EST -----   ----- Message ----- From: Arnoldo Lenis, MD Sent: 05/20/2016   2:57 PM To: Staci T Ashworth, CMA  Potassium is low. Clarify she is taking KCL 10 meQ bid, and if so increase to 30meQ bid for 4 days then resume 40meQ bid  Zandra Abts MD

## 2016-05-27 ENCOUNTER — Telehealth: Payer: Self-pay

## 2016-05-27 MED ORDER — METOPROLOL TARTRATE 25 MG PO TABS: 25.0000 mg | ORAL_TABLET | Freq: Two times a day (BID) | ORAL | 3 refills | Status: DC

## 2016-05-27 NOTE — Telephone Encounter (Signed)
Left message for pt to return call.

## 2016-05-27 NOTE — Telephone Encounter (Signed)
-----   Message from Arnoldo Lenis, MD sent at 05/27/2016  3:54 PM EST ----- Heart moniotor shows some occasional extra heart beats, but no dangerous abnormal rhythms. If she is still having significant symptoms of heart fluttering we can consider trying a medication for her to help. If still having palpitations try lopressor 25mg  po bid  Carlyle Dolly MD

## 2016-05-27 NOTE — Telephone Encounter (Signed)
Pt made aware, copy to pcp. Sent in prescription for lopressor 25 mg - bid.

## 2016-06-02 NOTE — Progress Notes (Signed)
Cardiology Office Note   Date:  06/03/2016   ID:  Cortnee, Campbell July 01, 1955, MRN [id number]  PCP:  Maggie Font, MD  Cardiologist: Cloria Spring, NP   Chief Complaint  Patient presents with  . Palpitations      History of Present Illness: Hannah Cooper is a 61 y.o. female who presents for ongoing assessment and management of palpitations. The patient was last seen by Dr. Harl Bowie on 05/13/2016 for these new complaints. The patient had decreased caffeine intake. Most recent cardiac catheterization revealed normal coronary arteries without CAD, echocardiogram in March 2015 revealed normal LV systolic function.   The patient was placed on a seven-day event monitor for evaluation of frequency of possible arrhythmias. Consideration for beta blocker or calcium channel blocker was discussed. Patient also had lab work completed which revealed hypokalemia. (3.2) I was advised to begin potassium supplement.  Cardiac monitor shows an occasional extra beats but no dangerous abnormal arrhythmias. She was sent and a prescription for metoprolol 25 mg twice a day. He is here for follow-up to evaluate response to [facility] for focused visit.   She is doing quite well on the new medication. She has no complaints of recurrent palpitations. She states that she is taking potassium and metoprolol as directed.  Past Medical History:  Diagnosis Date  . Arthritis   . CHF (congestive heart failure) ([place])   . COPD (chronic obstructive pulmonary disease) ([place])   . Hyperlipidemia   . Hypertension   . Meningitis     Past Surgical History:  Procedure Laterality Date  . ABDOMINAL HYSTERECTOMY    . LEFT HEART CATHETERIZATION WITH CORONARY ANGIOGRAM N/A 09/05/2012   Procedure: LEFT HEART CATHETERIZATION WITH CORONARY ANGIOGRAM;  Surgeon: Wellington Hampshire, MD;  Location: [facility];  Service: Cardiovascular;  Laterality: N/A;  . THYROID SURGERY  2002     Current Outpatient Prescriptions   Medication Sig Dispense Refill  . acetaminophen (TYLENOL) 500 MG tablet Take 1,000 mg by mouth every 6 (six) hours as needed for headache (pain).     Marland Kitchen albuterol (PROVENTIL) (2.5 MG/3ML) 0.083% nebulizer solution Take 2.5 mg by nebulization every 6 (six) hours as needed for wheezing or shortness of breath.    . Albuterol Sulfate (PROAIR RESPICLICK) 123XX123 (90 Base) MCG/ACT AEPB Inhale 2 puffs into the lungs every 4 (four) hours as needed (shortness of breath/ wheezing).    Marland Kitchen amLODipine (NORVASC) 10 MG tablet Take 1 tablet (10 mg total) by mouth daily. 90 tablet 3  . aspirin 325 MG tablet Take 1 tablet (325 mg total) by mouth daily. 30 tablet 0  . atorvastatin (LIPITOR) 20 MG tablet Take 20 mg by mouth daily.     . chlorthalidone (HYGROTON) 25 MG tablet Take 1 tablet (25 mg total) by mouth daily. 90 tablet 3  . cycloSPORINE (RESTASIS) 0.05 % ophthalmic emulsion Place 1 drop into both eyes 2 (two) times daily.     . famotidine (PEPCID) 20 MG tablet Take 1 tablet (20 mg total) by mouth daily. 30 tablet 0  . furosemide (LASIX) 20 MG tablet Take 1 tablet (20 mg total) by mouth daily as needed for fluid or edema. 30 tablet 0  . HYDROcodone-acetaminophen (NORCO/VICODIN) 5-325 MG per tablet Take 1 tablet by mouth every 4 (four) hours as needed for moderate pain. 30 tablet 0  . ibuprofen (ADVIL,MOTRIN) 800 MG tablet Take 1 tablet by mouth 2 (two) times daily as needed (back pain).   2  .  isosorbide mononitrate (IMDUR) 30 MG 24 hr tablet TAKE 1 TABLET (30 MG TOTAL) BY MOUTH DAILY. 90 tablet 2  . loratadine (CLARITIN) 10 MG tablet Take 10 mg by mouth daily as needed (seasonal allergies).   1  . losartan (COZAAR) 100 MG tablet TAKE ONE TABLET BY MOUTH DAILY FOR BLOOD PRESSURE  0  . meloxicam (MOBIC) 7.5 MG tablet Take 7.5 mg by mouth daily at 12 noon.   0  . methocarbamol (ROBAXIN) 500 MG tablet Take 1 tablet (500 mg total) by mouth every 8 (eight) hours as needed for muscle spasms. 30 tablet 0  . metoprolol  tartrate (LOPRESSOR) 25 MG tablet Take 1 tablet (25 mg total) by mouth 2 (two) times daily. 180 tablet 3  . nitroGLYCERIN (NITROSTAT) 0.4 MG SL tablet Place 1 tablet (0.4 mg total) under the tongue every 5 (five) minutes as needed for chest pain. 25 tablet 6  . ondansetron (ZOFRAN-ODT) 4 MG disintegrating tablet Take 1 tablet (4 mg total) by mouth every 8 (eight) hours as needed for nausea or vomiting. 20 tablet 0  . polyethylene glycol (MIRALAX / GLYCOLAX) packet Take 17 g by mouth daily. 14 each 0  . potassium chloride (K-DUR) 10 MEQ tablet Starting 05/21/16, take 20 meq ( 2 tablets) twice a day for 4 days, THEN, go back to 10 meq ( 1 tablet ) twice a day 70 tablet 3  . senna-docusate (SENOKOT-S) 8.6-50 MG tablet Take 1 tablet by mouth at bedtime as needed for mild constipation. 30 tablet 0  . spironolactone (ALDACTONE) 25 MG tablet Take 1 tablet (25 mg total) by mouth 2 (two) times daily. 180 tablet 3  . umeclidinium-vilanterol (ANORO ELLIPTA) 62.5-25 MCG/INH AEPB Inhale 1 puff into the lungs daily as needed (shortness of breath/ wheezing).     No current facility-administered medications for this visit.     Allergies:   Other    Social History:  The patient  reports that she has never smoked. She has never used smokeless tobacco. She reports that she does not drink alcohol or use drugs.   Family History:  The patient's family history includes Heart attack (age of onset: 59) in her sister; Heart attack (age of onset: 72) in her mother.    ROS: All other systems are reviewed and negative. Unless otherwise mentioned in H&P    PHYSICAL EXAM: VS:  BP (!) 148/86   Pulse 66   Ht 5' 5.5" (1.664 m)   Wt 227 lb (103 kg)   SpO2 99%   BMI 37.20 kg/m  , BMI Body mass index is 37.2 kg/m. GEN: Well nourished, well developed, in no acute distress  HEENT: normal  Neck: no JVD, carotid bruits, or masses Cardiac: RRR; no murmurs, rubs, or gallops,no edema  Respiratory:  clear to auscultation  bilaterally, normal work of breathing GI: soft, nontender, nondistended, + BS sych: euthymic mood, full affect    Recent Labs: 03/13/2016: ALT 28 03/14/2016: Hemoglobin 14.5; Platelets 258 05/18/2016: BUN 9; Creatinine, Ser 0.78; Magnesium 1.9; Potassium 3.2; Sodium 136; TSH 2.150    Lipid Panel    Component Value Date/Time   CHOL 123 03/14/2016 0739   TRIG 85 03/14/2016 0739   HDL 46 03/14/2016 0739   CHOLHDL 2.7 03/14/2016 0739   VLDL 17 03/14/2016 0739   LDLCALC 60 03/14/2016 0739      Wt Readings from Last 3 Encounters:  06/03/16 227 lb (103 kg)  05/13/16 226 lb (102.5 kg)  03/13/16 219 lb (99.3  kg)      Other studies Reviewed: - Left ventricle: The cavity size was normal. Systolic function was   vigorous. The estimated ejection fraction was in the range of 65%   to 70%. Wall motion was normal; there were no regional wall   motion abnormalities. There was an increased relative   contribution of atrial contraction to ventricular filling.   Doppler parameters are consistent with abnormal left ventricular   relaxation (grade 1 diastolic dysfunction). - Pulmonic valve: There was trivial regurgitation. - Pulmonary arteries: Systolic pressure could not be accurately   estimated.  ASSESSMENT AND PLAN:  1.Palpatations: Much better controlled on metoprolol and potassium. No changes are being made. We'll see her again in one year unless symptomatic.     Current medicines are reviewed at length with the patient today.    Labs/ tests ordered today include:  No orders of the defined types were placed in this encounter.    Disposition:   FU with  One year Signed, [name], NP  06/03/2016 2:07 PM    [facility] [address], [place], [place] [address] Phone: [phone]; Fax: [phone]

## 2016-06-03 ENCOUNTER — Encounter: Payer: Self-pay | Admitting: Adult Health

## 2016-06-03 ENCOUNTER — Ambulatory Visit (INDEPENDENT_AMBULATORY_CARE_PROVIDER_SITE_OTHER): Payer: Medicaid Other | Admitting: Adult Health

## 2016-06-03 VITALS — BP 148/86 | HR 66 | Ht 65.5 in | Wt 227.0 lb

## 2016-06-03 DIAGNOSIS — R002 Palpitations: Secondary | ICD-10-CM | POA: Diagnosis not present

## 2016-06-03 MED ORDER — METOPROLOL TARTRATE 25 MG PO TABS: 25.0000 mg | ORAL_TABLET | Freq: Two times a day (BID) | ORAL | 3 refills | Status: DC

## 2016-06-03 NOTE — Patient Instructions (Signed)

## 2016-06-03 NOTE — Progress Notes (Signed)
Name: Hannah Cooper    DOB: [birth year]  Age: 61 y.o.  MR#: [id number]       PCP:  Maggie Font, MD      Insurance: Payor: MEDICAID [place] / Plan: MEDICAID [place] ACCESS / Product Type: *No Product type* /   CC:   No chief complaint on file.   VS Vitals:   06/03/16 1308  Pulse: 66  SpO2: 99%  Weight: 227 lb (103 kg)  Height: 5' 5.5" (1.664 m)    Weights Current Weight  06/03/16 227 lb (103 kg)  05/13/16 226 lb (102.5 kg)  03/13/16 219 lb (99.3 kg)    Blood Pressure  BP Readings from Last 3 Encounters:  05/13/16 (!) 148/90  03/15/16 110/65  12/22/15 138/84     Admit date:  (Not on file) Last encounter with RMR:  03/07/2016   Allergy Other  Current Outpatient Prescriptions  Medication Sig Dispense Refill  . acetaminophen (TYLENOL) 500 MG tablet Take 1,000 mg by mouth every 6 (six) hours as needed for headache (pain).     Marland Kitchen albuterol (PROVENTIL) (2.5 MG/3ML) 0.083% nebulizer solution Take 2.5 mg by nebulization every 6 (six) hours as needed for wheezing or shortness of breath.    . Albuterol Sulfate (PROAIR RESPICLICK) [id number] (90 Base) MCG/ACT AEPB Inhale 2 puffs into the lungs every 4 (four) hours as needed (shortness of breath/ wheezing).    Marland Kitchen amLODipine (NORVASC) 10 MG tablet Take 1 tablet (10 mg total) by mouth daily. 90 tablet 3  . aspirin 325 MG tablet Take 1 tablet (325 mg total) by mouth daily. 30 tablet 0  . atorvastatin (LIPITOR) 20 MG tablet Take 20 mg by mouth daily.     . chlorthalidone (HYGROTON) 25 MG tablet Take 1 tablet (25 mg total) by mouth daily. 90 tablet 3  . cycloSPORINE (RESTASIS) 0.05 % ophthalmic emulsion Place 1 drop into both eyes 2 (two) times daily.     . famotidine (PEPCID) 20 MG tablet Take 1 tablet (20 mg total) by mouth daily. 30 tablet 0  . furosemide (LASIX) 20 MG tablet Take 1 tablet (20 mg total) by mouth daily as needed for fluid or edema. 30 tablet 0  . HYDROcodone-acetaminophen (NORCO/VICODIN) 5-325 MG per tablet Take 1 tablet by mouth  every 4 (four) hours as needed for moderate pain. 30 tablet 0  . ibuprofen (ADVIL,MOTRIN) 800 MG tablet Take 1 tablet by mouth 2 (two) times daily as needed (back pain).   2  . isosorbide mononitrate (IMDUR) 30 MG 24 hr tablet TAKE 1 TABLET (30 MG TOTAL) BY MOUTH DAILY. 90 tablet 2  . loratadine (CLARITIN) 10 MG tablet Take 10 mg by mouth daily as needed (seasonal allergies).   1  . losartan (COZAAR) 100 MG tablet TAKE ONE TABLET BY MOUTH DAILY FOR BLOOD PRESSURE  0  . meloxicam (MOBIC) 7.5 MG tablet Take 7.5 mg by mouth daily at 12 noon.   0  . methocarbamol (ROBAXIN) 500 MG tablet Take 1 tablet (500 mg total) by mouth every 8 (eight) hours as needed for muscle spasms. 30 tablet 0  . metoprolol tartrate (LOPRESSOR) 25 MG tablet Take 1 tablet (25 mg total) by mouth 2 (two) times daily. 180 tablet 3  . nitroGLYCERIN (NITROSTAT) 0.4 MG SL tablet Place 1 tablet (0.4 mg total) under the tongue every 5 (five) minutes as needed for chest pain. 25 tablet 6  . ondansetron (ZOFRAN-ODT) 4 MG disintegrating tablet Take 1 tablet (4 mg total) by  mouth every 8 (eight) hours as needed for nausea or vomiting. 20 tablet 0  . polyethylene glycol (MIRALAX / GLYCOLAX) packet Take 17 g by mouth daily. 14 each 0  . potassium chloride (K-DUR) 10 MEQ tablet Starting 05/21/16, take 20 meq ( 2 tablets) twice a day for 4 days, THEN, go back to 10 meq ( 1 tablet ) twice a day 70 tablet 3  . senna-docusate (SENOKOT-S) 8.6-50 MG tablet Take 1 tablet by mouth at bedtime as needed for mild constipation. 30 tablet 0  . spironolactone (ALDACTONE) 25 MG tablet Take 1 tablet (25 mg total) by mouth 2 (two) times daily. 180 tablet 3  . umeclidinium-vilanterol (ANORO ELLIPTA) 62.5-25 MCG/INH AEPB Inhale 1 puff into the lungs daily as needed (shortness of breath/ wheezing).     No current facility-administered medications for this visit.     Discontinued Meds:   There are no discontinued medications.  Patient Active Problem List    Diagnosis Date Noted  . Left-sided weakness 03/13/2016  . Cerebrovascular accident (CVA) ([place])   . Palpitations   . COPD (chronic obstructive pulmonary disease) ([place]) 08/20/2014  . Dyspnea 12/19/2013  . Chronic diastolic CHF (congestive heart failure) ([place]) 07/05/2013  . Lower extremity edema 07/05/2013  . Hyperlipidemia   . Chest pain 07/04/2013  . Hypokalemia 09/04/2012  . Precordial pain 09/04/2012  . Viral meningitis 01/22/2011  . Vomiting 01/22/2011  . PNEUMONIA, LEFT LOWER LOBE 10/10/2006  . DISEASE, ACUTE BRONCHOSPASM 10/10/2006  . Essential hypertension 05/23/2006  . OSTEOARTHRITIS 05/23/2006    LABS    Component Value Date/Time   NA 136 05/18/2016 1528   NA 137 03/14/2016 0739   NA 136 03/13/2016 1451   K 3.2 (L) 05/18/2016 1528   K 3.9 03/14/2016 0739   K 2.8 (L) 03/13/2016 1451   CL 103 05/18/2016 1528   CL 101 03/14/2016 0739   CL 101 03/13/2016 1451   CO2 28 05/18/2016 1528   CO2 26 03/14/2016 0739   CO2 28 03/13/2016 1451   GLUCOSE 100 (H) 05/18/2016 1528   GLUCOSE 102 (H) 03/14/2016 0739   GLUCOSE 127 (H) 03/13/2016 1451   BUN 9 05/18/2016 1528   BUN 10 03/14/2016 0739   BUN 10 03/13/2016 1451   CREATININE 0.78 05/18/2016 1528   CREATININE 0.75 03/14/2016 0739   CREATININE 0.84 03/13/2016 1451   CREATININE 0.85 07/12/2013 1558   CREATININE 0.81 09/20/2012 1412   CALCIUM 8.7 (L) 05/18/2016 1528   CALCIUM 9.4 03/14/2016 0739   CALCIUM 8.9 03/13/2016 1451   GFRNONAA >60 05/18/2016 1528   GFRNONAA >60 03/14/2016 0739   GFRNONAA >60 03/13/2016 1451   GFRAA >60 05/18/2016 1528   GFRAA >60 03/14/2016 0739   GFRAA >60 03/13/2016 1451   CMP     Component Value Date/Time   NA 136 05/18/2016 1528   K 3.2 (L) 05/18/2016 1528   CL 103 05/18/2016 1528   CO2 28 05/18/2016 1528   GLUCOSE 100 (H) 05/18/2016 1528   BUN 9 05/18/2016 1528   CREATININE 0.78 05/18/2016 1528   CREATININE 0.85 07/12/2013 1558   CALCIUM 8.7 (L) 05/18/2016 1528   PROT 8.1  03/13/2016 0357   ALBUMIN 4.2 03/13/2016 0357   AST 29 03/13/2016 0357   ALT 28 03/13/2016 0357   ALKPHOS 70 03/13/2016 0357   BILITOT 1.2 03/13/2016 0357   GFRNONAA >60 05/18/2016 1528   GFRAA >60 05/18/2016 1528       Component Value Date/Time   WBC 3.3 (  L) 03/14/2016 0739   WBC 7.6 03/13/2016 0357   WBC 3.6 (L) 04/26/2015 2202   HGB 14.5 03/14/2016 0739   HGB 13.6 03/13/2016 0405   HGB 12.5 03/13/2016 0357   HCT 42.0 03/14/2016 0739   HCT 40.0 03/13/2016 0405   HCT 37.0 03/13/2016 0357   MCV 86.4 03/14/2016 0739   MCV 88.1 03/13/2016 0357   MCV 89.8 04/26/2015 2202    Lipid Panel     Component Value Date/Time   CHOL 123 03/14/2016 0739   TRIG 85 03/14/2016 0739   HDL 46 03/14/2016 0739   CHOLHDL 2.7 03/14/2016 0739   VLDL 17 03/14/2016 0739   LDLCALC 60 03/14/2016 0739    ABG    Component Value Date/Time   TCO2 31 03/13/2016 0405     Lab Results  Component Value Date   TSH 2.150 05/18/2016   BNP (last 3 results) No results for input(s): BNP in the last 8760 hours.  ProBNP (last 3 results) No results for input(s): PROBNP in the last 8760 hours.  Cardiac Panel (last 3 results) No results for input(s): CKTOTAL, CKMB, TROPONINI, RELINDX in the last 72 hours.  Iron/TIBC/Ferritin/ %Sat No results found for: IRON, TIBC, FERRITIN, IRONPCTSAT   EKG Orders placed or performed in visit on 05/13/16  . EKG 12-Lead     Prior Assessment and Plan Problem List as of 06/03/2016 Reviewed: 05/15/2016  6:45 PM by Carlyle Dolly, MD     Cardiovascular and Mediastinum   Essential hypertension   Last Assessment & Plan 08/20/2014 Office Visit Written 08/20/2014 11:53 AM by Imogene Burn, PA-C    Uncontrolled. Increase lisinopril to 20 mg daily. 2 g sodium diet.      Chronic diastolic CHF (congestive heart failure) [facility])   Last Assessment & Plan 08/20/2014 Office Visit Written 08/20/2014 11:52 AM by Imogene Burn, PA-C    Patient has acute on chronic diastolic heart  failure. Will increase Lasix to 40 mg once daily. Increase potassium to 20 mEq daily. Her blood pressure is also not controlled. Increase lisinopril to 20 mg daily. Check bmet in 2 weeks and see her back in 2 weeks.      Cerebrovascular accident (CVA) ([place])     Respiratory   PNEUMONIA, LEFT LOWER LOBE   COPD (chronic obstructive pulmonary disease) [facility])   Last Assessment & Plan 08/20/2014 Office Visit Written 08/20/2014 11:54 AM by Imogene Burn, PA-C    Managed by Dr. Luan Pulling. Currently on antibiotics and steroids.        Nervous and Auditory   Viral meningitis   Left-sided weakness     Musculoskeletal and Integument   OSTEOARTHRITIS     Other   DISEASE, ACUTE BRONCHOSPASM   Vomiting   Hypokalemia   Last Assessment & Plan 11/12/2012 Office Visit Written 11/12/2012  4:46 PM by Lendon Colonel, NP    Recent labs demonstrate a potassium of 4.6.       Precordial pain   Last Assessment & Plan 09/20/2012 Office Visit Written 09/20/2012  2:16 PM by Lendon Colonel, NP    Resolved.      Chest pain   Last Assessment & Plan 08/20/2014 Office Visit Written 08/20/2014 11:53 AM by Imogene Burn, PA-C    Recent emergency room visit with chest pain in the setting of bronchitis and COPD exacerbation. Troponins EKGs negative CT negative for PE. No further workup.      Hyperlipidemia   Lower extremity edema   Dyspnea  Last Assessment & Plan 05/16/2014 Office Visit Written 05/16/2014  4:20 PM by Lendon Colonel, NP    PFT's abnormal. She is found to have evidence of COPD. Will refer to Dr. Luan Pulling for pulmonology consult. She will be started on Proventil inhaler prn. We will defer any further testing and treatment to Dr. Luan Pulling. She is willing to see him.       Palpitations       Imaging: No results found.

## 2016-06-06 ENCOUNTER — Other Ambulatory Visit (HOSPITAL_COMMUNITY): Payer: Self-pay | Admitting: Neurosurgery

## 2016-06-06 DIAGNOSIS — M4726 Other spondylosis with radiculopathy, lumbar region: Secondary | ICD-10-CM

## 2016-06-09 ENCOUNTER — Ambulatory Visit (HOSPITAL_COMMUNITY)
Admission: RE | Admit: 2016-06-09 | Discharge: 2016-06-09 | Disposition: A | Discharge status: Home / Self Care | Payer: Medicaid Other | Source: Ambulatory Visit | Attending: Neurosurgery | Admitting: Neurosurgery

## 2016-06-09 DIAGNOSIS — M4807 Spinal stenosis, lumbosacral region: Secondary | ICD-10-CM | POA: Insufficient documentation

## 2016-06-09 DIAGNOSIS — M4316 Spondylolisthesis, lumbar region: Secondary | ICD-10-CM | POA: Insufficient documentation

## 2016-06-09 DIAGNOSIS — M899 Disorder of bone, unspecified: Secondary | ICD-10-CM | POA: Insufficient documentation

## 2016-06-09 DIAGNOSIS — M4726 Other spondylosis with radiculopathy, lumbar region: Secondary | ICD-10-CM | POA: Insufficient documentation

## 2016-11-03 ENCOUNTER — Emergency Department (HOSPITAL_COMMUNITY): Payer: Medicaid Other

## 2016-11-03 ENCOUNTER — Encounter (HOSPITAL_COMMUNITY): Payer: Self-pay | Admitting: *Deleted

## 2016-11-03 ENCOUNTER — Emergency Department (HOSPITAL_COMMUNITY)
Admission: EM | Admit: 2016-11-03 | Discharge: 2016-11-03 | Disposition: A | Discharge status: Home / Self Care | Payer: Medicaid Other | Attending: Emergency Medicine | Admitting: Emergency Medicine

## 2016-11-03 DIAGNOSIS — Z7982 Long term (current) use of aspirin: Secondary | ICD-10-CM | POA: Insufficient documentation

## 2016-11-03 DIAGNOSIS — R109 Unspecified abdominal pain: Secondary | ICD-10-CM

## 2016-11-03 DIAGNOSIS — J449 Chronic obstructive pulmonary disease, unspecified: Secondary | ICD-10-CM | POA: Diagnosis not present

## 2016-11-03 DIAGNOSIS — I110 Hypertensive heart disease with heart failure: Secondary | ICD-10-CM | POA: Diagnosis not present

## 2016-11-03 DIAGNOSIS — E785 Hyperlipidemia, unspecified: Secondary | ICD-10-CM | POA: Insufficient documentation

## 2016-11-03 DIAGNOSIS — I509 Heart failure, unspecified: Secondary | ICD-10-CM | POA: Diagnosis not present

## 2016-11-03 DIAGNOSIS — R110 Nausea: Secondary | ICD-10-CM | POA: Diagnosis not present

## 2016-11-03 DIAGNOSIS — M545 Low back pain: Secondary | ICD-10-CM | POA: Insufficient documentation

## 2016-11-03 DIAGNOSIS — Z79899 Other long term (current) drug therapy: Secondary | ICD-10-CM | POA: Insufficient documentation

## 2016-11-03 HISTORY — DX: Other chronic pain: G89.29

## 2016-11-03 HISTORY — DX: Dorsalgia, unspecified: M54.9

## 2016-11-03 HISTORY — DX: Radiculopathy, lumbar region: M54.16

## 2016-11-03 LAB — CBC WITH DIFFERENTIAL/PLATELET
BASOS ABS: 0 10*3/uL (ref 0.0–0.1)
BASOS PCT: 0 %
EOS ABS: 0.2 10*3/uL (ref 0.0–0.7)
Eosinophils Relative: 4 %
HEMATOCRIT: 37.1 % (ref 36.0–46.0)
Hemoglobin: 12.4 g/dL (ref 12.0–15.0)
Lymphocytes Relative: 40 %
Lymphs Abs: 1.7 10*3/uL (ref 0.7–4.0)
MCH: 29.6 pg (ref 26.0–34.0)
MCHC: 33.4 g/dL (ref 30.0–36.0)
MCV: 88.5 fL (ref 78.0–100.0)
MONO ABS: 0.4 10*3/uL (ref 0.1–1.0)
Monocytes Relative: 11 %
NEUTROS ABS: 1.9 10*3/uL (ref 1.7–7.7)
Neutrophils Relative %: 45 %
PLATELETS: 278 10*3/uL (ref 150–400)
RBC: 4.19 MIL/uL (ref 3.87–5.11)
RDW: 13.7 % (ref 11.5–15.5)
WBC: 4.1 10*3/uL (ref 4.0–10.5)

## 2016-11-03 LAB — COMPREHENSIVE METABOLIC PANEL
ALBUMIN: 4.3 g/dL (ref 3.5–5.0)
ALT: 25 U/L (ref 14–54)
ANION GAP: 8 (ref 5–15)
AST: 23 U/L (ref 15–41)
Alkaline Phosphatase: 75 U/L (ref 38–126)
BILIRUBIN TOTAL: 0.7 mg/dL (ref 0.3–1.2)
BUN: 11 mg/dL (ref 6–20)
CHLORIDE: 105 mmol/L (ref 101–111)
CO2: 26 mmol/L (ref 22–32)
Calcium: 9 mg/dL (ref 8.9–10.3)
Creatinine, Ser: 0.66 mg/dL (ref 0.44–1.00)
GFR calc Af Amer: >60 mL/min (ref >60)
GFR calc non Af Amer: >60 mL/min (ref >60)
GLUCOSE: 76 mg/dL (ref 65–99)
POTASSIUM: 3.6 mmol/L (ref 3.5–5.1)
SODIUM: 139 mmol/L (ref 135–145)
Total Protein: 8.1 g/dL (ref 6.5–8.1)

## 2016-11-03 LAB — URINALYSIS, ROUTINE W REFLEX MICROSCOPIC
Bilirubin Urine: NEGATIVE
GLUCOSE, UA: NEGATIVE mg/dL
Hgb urine dipstick: NEGATIVE
KETONES UR: NEGATIVE mg/dL
LEUKOCYTES UA: NEGATIVE
NITRITE: NEGATIVE
PH: 6 (ref 5.0–8.0)
Protein, ur: NEGATIVE mg/dL
Specific Gravity, Urine: 1.017 (ref 1.005–1.030)

## 2016-11-03 LAB — LIPASE, BLOOD: Lipase: 35 U/L (ref 11–51)

## 2016-11-03 MED ORDER — HYDROCODONE-ACETAMINOPHEN 5-325 MG PO TABS: ORAL_TABLET | ORAL | 0 refills | Status: DC

## 2016-11-03 MED ORDER — METHOCARBAMOL 500 MG PO TABS: 1000.0000 mg | ORAL_TABLET | Freq: Four times a day (QID) | ORAL | 0 refills | Status: DC | PRN

## 2016-11-03 MED ORDER — ONDANSETRON HCL 4 MG PO TABS: 4.0000 mg | ORAL_TABLET | Freq: Four times a day (QID) | ORAL | 0 refills | Status: DC

## 2016-11-03 MED ORDER — MORPHINE SULFATE (PF) 4 MG/ML IV SOLN
4.0000 mg | Freq: Once | INTRAVENOUS | Status: AC
  Administered 2016-11-03: 4 mg via INTRAMUSCULAR
  Filled 2016-11-03: qty 1

## 2016-11-03 MED ORDER — ONDANSETRON 8 MG PO TBDP
8.0000 mg | ORAL_TABLET | Freq: Once | ORAL | Status: AC
  Administered 2016-11-03: 8 mg via ORAL
  Filled 2016-11-03: qty 1

## 2016-11-03 NOTE — ED Notes (Signed)
Lab at bedside

## 2016-11-03 NOTE — ED Provider Notes (Signed)
[facility] DEPT Provider Note   CSN: [id number] Arrival date & time: 11/03/16  1311     History   Chief Complaint Chief Complaint  Patient presents with  . Back Pain    HPI Hannah Cooper is a 61 y.o. female.  HPI  Pt was seen at 1335. Per pt, c/o sudden onset and persistence of waxing and waning right sided flank "pain" that began several days ago.  Pt describes the pain as "different" from her usual low back pain.  Has been associated with nausea.  Denies dysuria/hematuria, no abd pain, no vomiting/diarrhea, no CP/SOB, no fevers, no rash.    Past Medical History:  Diagnosis Date  . Arthritis   . CHF (congestive heart failure) ([place])   . Chronic back pain   . COPD (chronic obstructive pulmonary disease) ([place])   . Hyperlipidemia   . Hypertension   . Lumbar radiculopathy   . Meningitis     Patient Active Problem List   Diagnosis Date Noted  . Left-sided weakness 03/13/2016  . Cerebrovascular accident (CVA) ([place])   . Palpitations   . COPD (chronic obstructive pulmonary disease) ([place]) 08/20/2014  . Dyspnea 12/19/2013  . Chronic diastolic CHF (congestive heart failure) ([place]) 07/05/2013  . Lower extremity edema 07/05/2013  . Hyperlipidemia   . Chest pain 07/04/2013  . Hypokalemia 09/04/2012  . Precordial pain 09/04/2012  . Viral meningitis 01/22/2011  . Vomiting 01/22/2011  . PNEUMONIA, LEFT LOWER LOBE 10/10/2006  . DISEASE, ACUTE BRONCHOSPASM 10/10/2006  . Essential hypertension 05/23/2006  . OSTEOARTHRITIS 05/23/2006    Past Surgical History:  Procedure Laterality Date  . ABDOMINAL HYSTERECTOMY    . LEFT HEART CATHETERIZATION WITH CORONARY ANGIOGRAM N/A 09/05/2012   Procedure: LEFT HEART CATHETERIZATION WITH CORONARY ANGIOGRAM;  Surgeon: Wellington Hampshire, MD;  Location: [facility] CATH LAB;  Service: Cardiovascular;  Laterality: N/A;  . THYROID SURGERY  2002    OB History    Gravida Para Term Preterm AB Living             0   SAB TAB Ectopic Multiple Live  Births                   Home Medications    Prior to Admission medications   Medication Sig Start Date End Date Taking? Authorizing Provider  acetaminophen (TYLENOL) 500 MG tablet Take 1,000 mg by mouth every 6 (six) hours as needed for headache (pain).    Yes [provider]  albuterol (PROVENTIL) (2.5 MG/3ML) 0.083% nebulizer solution Take 2.5 mg by nebulization every 6 (six) hours as needed for wheezing or shortness of breath.   Yes [provider]  Albuterol Sulfate (PROAIR RESPICLICK) 532 (90 Base) MCG/ACT AEPB Inhale 2 puffs into the lungs every 4 (four) hours as needed (shortness of breath/ wheezing).   Yes [provider]  amLODipine (NORVASC) 10 MG tablet Take 1 tablet (10 mg total) by mouth daily. 06/17/15  Yes BranchAlphonse Guild, MD  aspirin 325 MG tablet Take 1 tablet (325 mg total) by mouth daily. 03/16/16  Yes Lavina Hamman, MD  atorvastatin (LIPITOR) 20 MG tablet Take 20 mg by mouth daily.    Yes [provider]  chlorthalidone (HYGROTON) 25 MG tablet Take 1 tablet (25 mg total) by mouth daily. 10/06/15  Yes Branch, Alphonse Guild, MD  cyclobenzaprine (FLEXERIL) 10 MG tablet Take 10 mg by mouth 3 (three) times daily as needed for muscle spasms.   Yes [provider]  cycloSPORINE (  RESTASIS) 0.05 % ophthalmic emulsion Place 1 drop into both eyes 2 (two) times daily.    Yes [provider]  famotidine (PEPCID) 20 MG tablet Take 1 tablet (20 mg total) by mouth daily. 03/16/16  Yes Lavina Hamman, MD  HYDROcodone-acetaminophen (NORCO/VICODIN) 5-325 MG per tablet Take 1 tablet by mouth every 4 (four) hours as needed for moderate pain. 01/05/15  Yes Samuella Cota, MD  ibuprofen (ADVIL,MOTRIN) 800 MG tablet Take 1 tablet by mouth 2 (two) times daily as needed (back pain).  08/01/14  Yes [provider]  isosorbide mononitrate (IMDUR) 30 MG 24 hr tablet TAKE 1 TABLET (30 MG TOTAL) BY MOUTH DAILY. 03/07/16  Yes Branch,  Alphonse Guild, MD  losartan (COZAAR) 100 MG tablet TAKE ONE TABLET BY MOUTH DAILY FOR BLOOD PRESSURE 03/11/15  Yes [provider]  meloxicam (MOBIC) 7.5 MG tablet Take 7.5 mg by mouth daily at 12 noon.  03/10/15  Yes [provider]  nitroGLYCERIN (NITROSTAT) 0.4 MG SL tablet Place 1 tablet (0.4 mg total) under the tongue every 5 (five) minutes as needed for chest pain. 09/09/15  Yes Imogene Burn, PA-C  ondansetron (ZOFRAN-ODT) 4 MG disintegrating tablet Take 1 tablet (4 mg total) by mouth every 8 (eight) hours as needed for nausea or vomiting. 03/15/16  Yes Lavina Hamman, MD  polyethylene glycol [facility] / GLYCOLAX) packet Take 17 g by mouth daily. 03/15/16  Yes Lavina Hamman, MD  potassium chloride (K-DUR) 10 MEQ tablet Starting 05/21/16, take 20 meq ( 2 tablets) twice a day for 4 days, THEN, go back to 10 meq ( 1 tablet ) twice a day 05/20/16  Yes Branch, Alphonse Guild, MD  senna-docusate (SENOKOT-S) 8.6-50 MG tablet Take 1 tablet by mouth at bedtime as needed for mild constipation. 03/15/16  Yes Lavina Hamman, MD  spironolactone (ALDACTONE) 25 MG tablet Take 1 tablet (25 mg total) by mouth 2 (two) times daily. 10/19/15  Yes Lendon Colonel, NP  umeclidinium-vilanterol (ANORO ELLIPTA) 62.5-25 MCG/INH AEPB Inhale 1 puff into the lungs daily as needed (shortness of breath/ wheezing).   Yes [provider]  furosemide (LASIX) 20 MG tablet Take 1 tablet (20 mg total) by mouth daily as needed for fluid or edema. Patient not taking: Reported on 11/03/2016 03/15/16   Lavina Hamman, MD  loratadine (CLARITIN) 10 MG tablet Take 10 mg by mouth daily as needed (seasonal allergies).  02/09/16   [provider]  methocarbamol (ROBAXIN) 500 MG tablet Take 1 tablet (500 mg total) by mouth every 8 (eight) hours as needed for muscle spasms. Patient not taking: Reported on 11/03/2016 03/15/16   Lavina Hamman, MD  metoprolol tartrate (LOPRESSOR) 25 MG tablet Take 1 tablet (25 mg  total) by mouth 2 (two) times daily. Patient not taking: Reported on 11/03/2016 06/03/16   Lendon Colonel, NP    Family History Family History  Problem Relation Age of Onset  . Heart attack Mother 81  . Heart attack Sister 67    Social History Social History  Substance Use Topics  . Smoking status: Never Smoker  . Smokeless tobacco: Never Used  . Alcohol use No     Allergies   Other   Review of Systems Review of Systems ROS: Statement: All systems negative except as marked or noted in the HPI; Constitutional: Negative for fever and chills. ; ; Eyes: Negative for eye pain, redness and discharge. ; ; ENMT: Negative for ear pain, hoarseness,  nasal congestion, sinus pressure and sore throat. ; ; Cardiovascular: Negative for chest pain, palpitations, diaphoresis, dyspnea and peripheral edema. ; ; Respiratory: Negative for cough, wheezing and stridor. ; ; Gastrointestinal: +nausea. Negative for vomiting, diarrhea, abdominal pain, blood in stool, hematemesis, jaundice and rectal bleeding. . ; ; Genitourinary: +flank pain. Negative for dysuria and hematuria. ; ; Musculoskeletal: Negative for neck pain. Negative for swelling and trauma.; ; Skin: Negative for pruritus, rash, abrasions, blisters, bruising and skin lesion.; ; Neuro: Negative for headache, lightheadedness and neck stiffness. Negative for weakness, altered level of consciousness, altered mental status, extremity weakness, paresthesias, involuntary movement, seizure and syncope.      Physical Exam Updated Vital Signs BP (!) 172/87 (BP Location: Right Arm)   Pulse 67   Temp 97.9 F (36.6 C) (Oral)   Resp 17   Ht 5\' 6"  (1.676 m)   Wt 101.2 kg (223 lb)   SpO2 98%   BMI 35.99 kg/m   Physical Exam 1340: Physical examination:  Nursing notes reviewed; Vital signs and O2 SAT reviewed;  Constitutional: Well developed, Well nourished, Well hydrated, Uncomfortable appearing.;; Head:  Normocephalic, atraumatic; Eyes: EOMI,  PERRL, No scleral icterus; ENMT: Mouth and pharynx normal, Mucous membranes moist; Neck: Supple, Full range of motion, No lymphadenopathy; Cardiovascular: Regular rate and rhythm, No gallop; Respiratory: Breath sounds clear & equal bilaterally, No wheezes.  Speaking full sentences with ease, Normal respiratory effort/excursion; Chest: Nontender, Movement normal; Abdomen: Soft, Nontender, Nondistended, Normal bowel sounds; Genitourinary: No CVA tenderness; Spine:  No midline CS, TS, LS tenderness. +TTP right lumbar paraspinal muscles.;; Extremities: Pulses normal, No tenderness, No edema, No calf edema or asymmetry.; Neuro: AA&Ox3, Major CN grossly intact.  Speech clear. No gross focal motor or sensory deficits in extremities. Climbs on and off stretcher easily by herself. Gait steady;; Skin: Color normal, Warm, Dry.    ED Treatments / Results  Labs (all labs ordered are listed, but only abnormal results are displayed)   EKG  EKG Interpretation None       Radiology   Procedures Procedures (including critical care time)  Medications Ordered in ED Medications  ondansetron (ZOFRAN-ODT) disintegrating tablet 8 mg (8 mg Oral Given 11/03/16 1354)  morphine 4 MG/ML injection 4 mg (4 mg Intramuscular Given 11/03/16 1354)     Initial Impression / Assessment and Plan / ED Course  I have reviewed the triage vital signs and the nursing notes.  Pertinent labs & imaging results that were available during my care of the patient were reviewed by me and considered in my medical decision making (see chart for details).  MDM Reviewed: previous chart, nursing note and vitals Reviewed previous: labs Interpretation: labs and CT scan   Results for orders placed or performed during the hospital encounter of 11/03/16  CBC with Differential  Result Value Ref Range   WBC 4.1 4.0 - 10.5 K/uL   RBC 4.19 3.87 - 5.11 MIL/uL   Hemoglobin 12.4 12.0 - 15.0 g/dL   HCT 37.1 36.0 - 46.0 %   MCV 88.5 78.0 -  100.0 fL   MCH 29.6 26.0 - 34.0 pg   MCHC 33.4 30.0 - 36.0 g/dL   RDW 13.7 11.5 - 15.5 %   Platelets 278 150 - 400 K/uL   Neutrophils Relative % 45 %   Neutro Abs 1.9 1.7 - 7.7 K/uL   Lymphocytes Relative 40 %   Lymphs Abs 1.7 0.7 - 4.0 K/uL   Monocytes Relative 11 %   Monocytes Absolute  0.4 0.1 - 1.0 K/uL   Eosinophils Relative 4 %   Eosinophils Absolute 0.2 0.0 - 0.7 K/uL   Basophils Relative 0 %   Basophils Absolute 0.0 0.0 - 0.1 K/uL  Comprehensive metabolic panel  Result Value Ref Range   Sodium 139 135 - 145 mmol/L   Potassium 3.6 3.5 - 5.1 mmol/L   Chloride 105 101 - 111 mmol/L   CO2 26 22 - 32 mmol/L   Glucose, Bld 76 65 - 99 mg/dL   BUN 11 6 - 20 mg/dL   Creatinine, Ser 0.66 0.44 - 1.00 mg/dL   Calcium 9.0 8.9 - 10.3 mg/dL   Total Protein 8.1 6.5 - 8.1 g/dL   Albumin 4.3 3.5 - 5.0 g/dL   AST 23 15 - 41 U/L   ALT 25 14 - 54 U/L   Alkaline Phosphatase 75 38 - 126 U/L   Total Bilirubin 0.7 0.3 - 1.2 mg/dL   GFR calc non Af Amer >60 >60 mL/min   GFR calc Af Amer >60 >60 mL/min   Anion gap 8 5 - 15  Lipase, blood  Result Value Ref Range   Lipase 35 11 - 51 U/L  Urinalysis, Routine w reflex microscopic  Result Value Ref Range   Color, Urine YELLOW YELLOW   APPearance CLEAR CLEAR   Specific Gravity, Urine 1.017 1.005 - 1.030   pH 6.0 5.0 - 8.0   Glucose, UA NEGATIVE NEGATIVE mg/dL   Hgb urine dipstick NEGATIVE NEGATIVE   Bilirubin Urine NEGATIVE NEGATIVE   Ketones, ur NEGATIVE NEGATIVE mg/dL   Protein, ur NEGATIVE NEGATIVE mg/dL   Nitrite NEGATIVE NEGATIVE   Leukocytes, UA NEGATIVE NEGATIVE    Ct Renal Stone Study Result Date: 11/03/2016 CLINICAL DATA:  Right lower back pain with nausea. EXAM: CT ABDOMEN AND PELVIS WITHOUT CONTRAST TECHNIQUE: Multidetector CT imaging of the abdomen and pelvis was performed following the standard protocol without IV contrast. COMPARISON:  05/15/2005 CT FINDINGS: Lower chest: Unexpectedly prominent thymus with nodular appearance,  stable where covered when compared to 2016 chest CT. Small fatty Bochdalek's hernia on the left. Hepatobiliary: Patchy hepatic steatosis with gallbladder fossa sparing.No evidence of biliary obstruction or stone. Pancreas: Unremarkable. Spleen: Unremarkable. Adrenals/Urinary Tract: Negative adrenals. No hydronephrosis or stone. Unremarkable bladder. Stomach/Bowel:  No obstruction. No appendicitis. Vascular/Lymphatic: No acute vascular abnormality. Stable mild enlargement of low left periaortic lymph node measuring 9 mm short axis on 2:45. Reproductive:  Hysterectomy.  Negative adnexae. Other: No ascites or pneumoperitoneum. Fatty umbilical hernia. Minimal haziness of mesenteric fat that is considered incidental. Musculoskeletal: No acute or aggressive finding. Osteitis pubis. Multilevel facet arthritis. IMPRESSION: 1. No acute finding.  No hydronephrosis or urolithiasis. 2. Hepatic steatosis. 3. Hyperplastic appearance of the thymus, present since at least 2016. 4. Small fatty umbilical hernia. Electronically Signed   By: Monte Fantasia M.D.   On: 11/03/2016 15:04    1655:  Workup reassuring. Tx symptomatically at this time. Dx and testing d/w pt and family.  Questions answered.  Verb understanding, agreeable to d/c home with outpt f/u.    Final Clinical Impressions(s) / ED Diagnoses   Final diagnoses:  None    New Prescriptions New Prescriptions   No medications on file     Francine Graven, DO 11/07/16 1539

## 2016-11-03 NOTE — ED Triage Notes (Signed)
Low back pain with nausea

## 2016-11-03 NOTE — Discharge Instructions (Signed)
Take the prescriptions as directed.  Apply moist heat or ice to the area(s) of discomfort, for 15 minutes at a time, several times per day for the next few days.  Do not fall asleep on a heating or ice pack.  Call your regular medical doctor tomorrow to schedule a follow up appointment in the next 3 days.  Return to the Emergency Department immediately if worsening. ° °

## 2016-11-03 NOTE — ED Notes (Signed)
ED Provider at bedside. 

## 2016-11-05 LAB — URINE CULTURE

## 2016-11-10 ENCOUNTER — Encounter: Payer: Self-pay | Admitting: *Deleted

## 2016-11-10 ENCOUNTER — Ambulatory Visit (INDEPENDENT_AMBULATORY_CARE_PROVIDER_SITE_OTHER): Payer: Medicaid Other | Admitting: Cardiology

## 2016-11-10 ENCOUNTER — Encounter: Payer: Self-pay | Admitting: Cardiology

## 2016-11-10 VITALS — BP 144/88 | HR 70 | Ht 65.0 in | Wt 223.0 lb

## 2016-11-10 DIAGNOSIS — I10 Essential (primary) hypertension: Secondary | ICD-10-CM | POA: Diagnosis not present

## 2016-11-10 DIAGNOSIS — R002 Palpitations: Secondary | ICD-10-CM | POA: Diagnosis not present

## 2016-11-10 DIAGNOSIS — R0789 Other chest pain: Secondary | ICD-10-CM | POA: Diagnosis not present

## 2016-11-10 DIAGNOSIS — E782 Mixed hyperlipidemia: Secondary | ICD-10-CM | POA: Diagnosis not present

## 2016-11-10 MED ORDER — FUROSEMIDE 20 MG PO TABS: ORAL_TABLET | ORAL | 1 refills | Status: DC

## 2016-11-10 MED ORDER — METOPROLOL TARTRATE 25 MG PO TABS: 37.5000 mg | ORAL_TABLET | Freq: Two times a day (BID) | ORAL | 1 refills | Status: DC

## 2016-11-10 NOTE — Patient Instructions (Signed)
Your physician wants you to follow-up in: [place] will receive a reminder letter in the mail two months in advance. If you don't receive a letter, please call our office to schedule the follow-up appointment.  Your physician has recommended you make the following change in your medication:   INCREASE LOPRESSOR 37.5 MG TWICE DAILY (1-1/2 TABLETS TWICE DAILY)   CHANGE LASIX 20 MG DAILY AS NEEDED MAY TAKE 2 TABLETS (40 MG) AS NEEDED   Thank you for choosing [facility]!!

## 2016-11-10 NOTE — Progress Notes (Signed)
Clinical Summary Hannah Cooper is a 61 y.o.female seen today for follow up of the following medical problems.   1. Palpitations - started about 2-3 weeks ago. Can occur at any time. Feeling of heart pounding, can feel heart beat when placed hand on her chest. No specific trigger. Can cause fatigue, + SOB, +lightheadness. Can last a few minutes, but can have several episodes in a row. Happens multiple times a day - stopped coffee about 2 weeks, stopped sodas, no tea, no energy drinks, no alcohol.    05/2016 EKG showed SR with occaisonal PACs and PVCs - symptoms improved with metoprolol  - just occasional palpitaions, about once a week. Lasts a few minutes.  - avoiding caffeine.     2. Chest pain - long history of atypical chest pain - cath 2014 with patent coronaries. -  admit 12/2014 with chest pain, negative evaluation for ACS.   2 episodes of chest pain since last visit. Similar to previous episodes, overall unchanged since last visit  3. COPD - noted on 12/2013 PFTs - followed Dr Luan Pulling    4. HTN - compliant with meds -occasional elevated bp's at home around 170s/100s   5. Chronic diastolic heart failure - echo 06/2013 LVEF 55-60%, abnormal diastolic function.  - compliant with lasix prn. She is on daily chlorthalidone.    - has swelling at times, overall controlled.   6. Dyspnea - had CPX 06/2014, showed mild to mod reduced functoinal capacity - symptoms remain variable   SH: works at [employer] for kids. She has 3 adopted kids (9,10,11). Great grand baby any day now.     Plans to go to [facility] in a few weeks.  Past Medical History:  Diagnosis Date  . Arthritis   . CHF (congestive heart failure) ([place])   . Chronic back pain   . COPD (chronic obstructive pulmonary disease) ([place])   . Hyperlipidemia   . Hypertension   . Lumbar radiculopathy   . Meningitis      Allergies  Allergen Reactions  . Other Swelling    Avon  lipstick     Current Outpatient Prescriptions  Medication Sig Dispense Refill  . acetaminophen (TYLENOL) 500 MG tablet Take 1,000 mg by mouth every 6 (six) hours as needed for headache (pain).     Marland Kitchen albuterol (PROVENTIL) (2.5 MG/3ML) 0.083% nebulizer solution Take 2.5 mg by nebulization every 6 (six) hours as needed for wheezing or shortness of breath.    . Albuterol Sulfate (PROAIR RESPICLICK) 845 (90 Base) MCG/ACT AEPB Inhale 2 puffs into the lungs every 4 (four) hours as needed (shortness of breath/ wheezing).    Marland Kitchen amLODipine (NORVASC) 10 MG tablet Take 1 tablet (10 mg total) by mouth daily. 90 tablet 3  . aspirin 325 MG tablet Take 1 tablet (325 mg total) by mouth daily. 30 tablet 0  . atorvastatin (LIPITOR) 20 MG tablet Take 20 mg by mouth daily.     . chlorthalidone (HYGROTON) 25 MG tablet Take 1 tablet (25 mg total) by mouth daily. 90 tablet 3  . cyclobenzaprine (FLEXERIL) 10 MG tablet Take 10 mg by mouth 3 (three) times daily as needed for muscle spasms.    . cycloSPORINE (RESTASIS) 0.05 % ophthalmic emulsion Place 1 drop into both eyes 2 (two) times daily.     . famotidine (PEPCID) 20 MG tablet Take 1 tablet (20 mg total) by mouth daily. 30 tablet 0  . furosemide (LASIX) 20 MG tablet Take 1  tablet (20 mg total) by mouth daily as needed for fluid or edema. (Patient not taking: Reported on 11/03/2016) 30 tablet 0  . HYDROcodone-acetaminophen (NORCO/VICODIN) 5-325 MG tablet 1 or 2 tabs PO q6 hours prn pain 15 tablet 0  . ibuprofen (ADVIL,MOTRIN) 800 MG tablet Take 1 tablet by mouth 2 (two) times daily as needed (back pain).   2  . isosorbide mononitrate (IMDUR) 30 MG 24 hr tablet TAKE 1 TABLET (30 MG TOTAL) BY MOUTH DAILY. 90 tablet 2  . loratadine (CLARITIN) 10 MG tablet Take 10 mg by mouth daily as needed (seasonal allergies).   1  . losartan (COZAAR) 100 MG tablet TAKE ONE TABLET BY MOUTH DAILY FOR BLOOD PRESSURE  0  . meloxicam (MOBIC) 7.5 MG tablet Take 7.5 mg by mouth daily at 12  noon.   0  . methocarbamol (ROBAXIN) 500 MG tablet Take 2 tablets (1,000 mg total) by mouth 4 (four) times daily as needed for muscle spasms (muscle spasm/pain). 25 tablet 0  . metoprolol tartrate (LOPRESSOR) 25 MG tablet Take 1 tablet (25 mg total) by mouth 2 (two) times daily. (Patient not taking: Reported on 11/03/2016) 180 tablet 3  . nitroGLYCERIN (NITROSTAT) 0.4 MG SL tablet Place 1 tablet (0.4 mg total) under the tongue every 5 (five) minutes as needed for chest pain. 25 tablet 6  . ondansetron (ZOFRAN) 4 MG tablet Take 1 tablet (4 mg total) by mouth every 6 (six) hours. 12 tablet 0  . ondansetron (ZOFRAN-ODT) 4 MG disintegrating tablet Take 1 tablet (4 mg total) by mouth every 8 (eight) hours as needed for nausea or vomiting. 20 tablet 0  . polyethylene glycol (MIRALAX / GLYCOLAX) packet Take 17 g by mouth daily. 14 each 0  . potassium chloride (K-DUR) 10 MEQ tablet Starting 05/21/16, take 20 meq ( 2 tablets) twice a day for 4 days, THEN, go back to 10 meq ( 1 tablet ) twice a day 70 tablet 3  . senna-docusate (SENOKOT-S) 8.6-50 MG tablet Take 1 tablet by mouth at bedtime as needed for mild constipation. 30 tablet 0  . spironolactone (ALDACTONE) 25 MG tablet Take 1 tablet (25 mg total) by mouth 2 (two) times daily. 180 tablet 3  . umeclidinium-vilanterol (ANORO ELLIPTA) 62.5-25 MCG/INH AEPB Inhale 1 puff into the lungs daily as needed (shortness of breath/ wheezing).     No current facility-administered medications for this visit.      Past Surgical History:  Procedure Laterality Date  . ABDOMINAL HYSTERECTOMY    . LEFT HEART CATHETERIZATION WITH CORONARY ANGIOGRAM N/A 09/05/2012   Procedure: LEFT HEART CATHETERIZATION WITH CORONARY ANGIOGRAM;  Surgeon: Wellington Hampshire, MD;  Location: [facility] CATH [facility];  Service: Cardiovascular;  Laterality: N/A;  . THYROID SURGERY  2002     Allergies  Allergen Reactions  . Other Swelling    Avon lipstick      Family History  Problem Relation Age of  Onset  . Heart attack Mother 39  . Heart attack Sister 25     Social History Hannah Cooper reports that she has never smoked. She has never used smokeless tobacco. Hannah Cooper reports that she does not drink alcohol.   Review of Systems CONSTITUTIONAL: No weight loss, fever, chills, weakness or fatigue.  HEENT: Eyes: No visual loss, blurred vision, double vision or yellow sclerae.No hearing loss, sneezing, congestion, runny nose or sore throat.  SKIN: No rash or itching.  CARDIOVASCULAR: per hpi RESPIRATORY: per hpi GASTROINTESTINAL: No anorexia, nausea, vomiting or  diarrhea. No abdominal pain or blood.  GENITOURINARY: No burning on urination, no polyuria NEUROLOGICAL: No headache, dizziness, syncope, paralysis, ataxia, numbness or tingling in the extremities. No change in bowel or bladder control.  MUSCULOSKELETAL: No muscle, back pain, joint pain or stiffness.  LYMPHATICS: No enlarged nodes. No history of splenectomy.  PSYCHIATRIC: No history of depression or anxiety.  ENDOCRINOLOGIC: No reports of sweating, cold or heat intolerance. No polyuria or polydipsia.  Marland Kitchen   Physical Examination Vitals:   11/10/16 1014  BP: (!) 144/88  Pulse: 70   Vitals:   11/10/16 1014  Weight: 223 lb (101.2 kg)  Height: 5\' 5"  (1.651 m)    Gen: resting comfortably, no acute distress HEENT: no scleral icterus, pupils equal round and reactive, no palptable cervical adenopathy,  CV: RRR, no m/r/g, no jvd Resp: Clear to auscultation bilaterally GI: abdomen is soft, non-tender, non-distended, normal bowel sounds, no hepatosplenomegaly MSK: extremities are warm, no edema.  Skin: warm, no rash Neuro:  no focal deficits Psych: appropriate affect   Diagnostic Studies 08/2012 cath Hemodynamics: AO: 164/82 mmHg LV: 169/14 mmHg LVEDP: 24 mmHg  Coronary angiography: Coronary dominance: Right   Left Main: Normal  Left Anterior Descending (LAD): Normal in size with no significant  disease.  1st diagonal (D1): Small in size with minor irregularities.  2nd diagonal (D2): Normal in size with no significant disease.  3rd diagonal (D3): Normal in size with no significant disease.  Circumflex (LCx): Normal in size and nondominant. The vessel has no significant disease.  1st obtuse marginal: Small in size with no significant disease.  2nd obtuse marginal: Small in size with no significant disease.  3rd obtuse marginal: Large in size with no significant disease.  Right Coronary Artery: Normal in size and dominant. The vessel has no significant disease.  posterior descending artery: Normal in size with no significant disease.  posterior lateral branchs: Normal in size with no significant disease.  Left ventriculography: Left ventricular systolic function is normal , LVEF is estimated at 60-65% %, there is no significant mitral regurgitation   Final Conclusions:  1.Normal coronary arteries. 2. Normal LV systolic function. 3. Moderately elevated left ventricular end-diastolic pressure likely due to diastolic heart failure.   06/2013 echo Study Conclusions  - Procedure narrative: Transthoracic echocardiography. Image quality was suboptimal. The study was technically difficult, as a result of poor sound wave transmission and restricted patient mobility. - Left ventricle: The cavity size was normal. Wall thickness was increased in a pattern of mild to moderate LVH. Diastolic dysfunction noted, grade indeterminant. Systolic function was normal. The estimated ejection fraction was in the range of 55% to 60%. Wall motion was normal; there were no regional wall motion abnormalities.   Jan 2017 Event monitor: no arrhythmias  06/2014 CPX Conclusion: Exercise testing with gas exchange demonstrates a mild-moderately reduced functional capacity when compared to matched sedentary norms. There was an in-adequate HR response to the  exercise response and flat O2 pulse. This high HR reserve was likely high due to pulmonary limitations based on restrictive spirometry and RR 45 at peak exercise and reaching maximum ventilatory limits. However, it could have easily been due to poor effort. The sub-maximal effort is clouding interpretation of the test. Nevertheless, correlation of resting spirometry test Showing restriction with outpatient pulmonary function test is warranted   05/2016 heart monitor  Telemetry tracings predominately show sinus rhythm  Reported symptoms correlate with occasional PVCs and PACs    02/2016 echo Study Conclusions  - Left  ventricle: The cavity size was normal. Systolic function was   vigorous. The estimated ejection fraction was in the range of 65%   to 70%. Wall motion was normal; there were no regional wall   motion abnormalities. There was an increased relative   contribution of atrial contraction to ventricular filling.   Doppler parameters are consistent with abnormal left ventricular   relaxation (grade 1 diastolic dysfunction). - Pulmonic valve: There was trivial regurgitation. - Pulmonary arteries: Systolic pressure could not be accurately   estimated.   Assessment and Plan  1. Palpitations - still with occsaional symptoms, increase lopressor to 37.5mg  bid    2. Chest pain - long history of atypical chest pain. Negative cath in 2014 - chronic symptoms unchanged, continue to monitor  3. COPD - per Dr Luan Pulling   4. HTN - elevated, we will increase lopressor as described above   5. Chronic diastolic HF - appears euvolemic, continue current therapy     Arnoldo Lenis, M.D.

## 2016-12-22 ENCOUNTER — Emergency Department (HOSPITAL_COMMUNITY)
Admission: EM | Admit: 2016-12-22 | Discharge: 2016-12-22 | Disposition: A | Discharge status: Home / Self Care | Payer: No Typology Code available for payment source | Attending: Emergency Medicine | Admitting: Emergency Medicine

## 2016-12-22 ENCOUNTER — Encounter (HOSPITAL_COMMUNITY): Payer: Self-pay | Admitting: Emergency Medicine

## 2016-12-22 DIAGNOSIS — Z79899 Other long term (current) drug therapy: Secondary | ICD-10-CM | POA: Diagnosis not present

## 2016-12-22 DIAGNOSIS — R600 Localized edema: Secondary | ICD-10-CM | POA: Diagnosis not present

## 2016-12-22 DIAGNOSIS — J449 Chronic obstructive pulmonary disease, unspecified: Secondary | ICD-10-CM | POA: Diagnosis not present

## 2016-12-22 DIAGNOSIS — T7840XA Allergy, unspecified, initial encounter: Secondary | ICD-10-CM | POA: Diagnosis not present

## 2016-12-22 DIAGNOSIS — R21 Rash and other nonspecific skin eruption: Secondary | ICD-10-CM | POA: Diagnosis present

## 2016-12-22 DIAGNOSIS — I110 Hypertensive heart disease with heart failure: Secondary | ICD-10-CM | POA: Diagnosis not present

## 2016-12-22 DIAGNOSIS — I5030 Unspecified diastolic (congestive) heart failure: Secondary | ICD-10-CM | POA: Diagnosis not present

## 2016-12-22 MED ORDER — PREDNISONE 50 MG PO TABS
60.0000 mg | ORAL_TABLET | Freq: Once | ORAL | Status: AC
  Administered 2016-12-22: 60 mg via ORAL
  Filled 2016-12-22: qty 1

## 2016-12-22 MED ORDER — PREDNISONE 10 MG PO TABS: 20.0000 mg | ORAL_TABLET | Freq: Two times a day (BID) | ORAL | 0 refills | Status: DC

## 2016-12-22 NOTE — ED Notes (Signed)
ED Provider at bedside. 

## 2016-12-22 NOTE — Discharge Instructions (Signed)
For the allergic reaction, continue to take an antihistamine such as Zyrtec or Claritin, daily for 5-7 days.  Start the prescription for prednisone, tomorrow.

## 2016-12-22 NOTE — ED Triage Notes (Signed)
Pt noticed rash to body, around mouth and lip swelling that started Tuesday. Pt states she benadryl last night and zyrtec this afternoon.

## 2016-12-22 NOTE — ED Provider Notes (Signed)
[facility] DEPT Provider Note   CSN: [id number] Arrival date & time: 12/22/16  1923     History   Chief Complaint Chief Complaint  Patient presents with  . Allergic Reaction    HPI Hannah Cooper is a 61 y.o. female.  She presents for evaluation of lip swelling, itching and rash on face and arms.  Onset of symptoms when she used a new lipstick, several days ago.  She feels somewhat better after using Benadryl and Zyrtec.  No fever, chills, chest pain, shortness breath, weakness or dizziness.  No prior similar problems.  There are no other known modifying factors.     HPI  Past Medical History:  Diagnosis Date  . Arthritis   . CHF (congestive heart failure) ([place])   . Chronic back pain   . COPD (chronic obstructive pulmonary disease) ([place])   . Hyperlipidemia   . Hypertension   . Lumbar radiculopathy   . Meningitis     Patient Active Problem List   Diagnosis Date Noted  . Left-sided weakness 03/13/2016  . Cerebrovascular accident (CVA) ([place])   . Palpitations   . COPD (chronic obstructive pulmonary disease) ([place]) 08/20/2014  . Dyspnea 12/19/2013  . Chronic diastolic CHF (congestive heart failure) ([place]) 07/05/2013  . Lower extremity edema 07/05/2013  . Hyperlipidemia   . Chest pain 07/04/2013  . Hypokalemia 09/04/2012  . Precordial pain 09/04/2012  . Viral meningitis 01/22/2011  . Vomiting 01/22/2011  . PNEUMONIA, LEFT LOWER LOBE 10/10/2006  . DISEASE, ACUTE BRONCHOSPASM 10/10/2006  . Essential hypertension 05/23/2006  . OSTEOARTHRITIS 05/23/2006    Past Surgical History:  Procedure Laterality Date  . ABDOMINAL HYSTERECTOMY    . LEFT HEART CATHETERIZATION WITH CORONARY ANGIOGRAM N/A 09/05/2012   Procedure: LEFT HEART CATHETERIZATION WITH CORONARY ANGIOGRAM;  Surgeon: Wellington Hampshire, MD;  Location: [facility] CATH LAB;  Service: Cardiovascular;  Laterality: N/A;  . THYROID SURGERY  2002    OB History    Gravida Para Term Preterm AB Living             0   SAB TAB Ectopic Multiple Live Births                   Home Medications    Prior to Admission medications   Medication Sig Start Date End Date Taking? Authorizing Provider  acetaminophen (TYLENOL) 500 MG tablet Take 1,000 mg by mouth every 6 (six) hours as needed for headache (pain).     [provider]  albuterol (PROVENTIL) (2.5 MG/3ML) 0.083% nebulizer solution Take 2.5 mg by nebulization every 6 (six) hours as needed for wheezing or shortness of breath.    [provider]  Albuterol Sulfate (PROAIR RESPICLICK) 315 (90 Base) MCG/ACT AEPB Inhale 2 puffs into the lungs every 4 (four) hours as needed (shortness of breath/ wheezing).    [provider]  amLODipine (NORVASC) 10 MG tablet Take 1 tablet (10 mg total) by mouth daily. 06/17/15   Arnoldo Lenis, MD  aspirin 325 MG tablet Take 1 tablet (325 mg total) by mouth daily. 03/16/16   Lavina Hamman, MD  atorvastatin (LIPITOR) 20 MG tablet Take 20 mg by mouth daily.     [provider]  chlorthalidone (HYGROTON) 25 MG tablet Take 1 tablet (25 mg total) by mouth daily. 10/06/15   Arnoldo Lenis, MD  cyclobenzaprine (FLEXERIL) 10 MG tablet Take 10 mg by mouth 3 (three) times daily as needed for muscle spasms.    [provider]  cycloSPORINE (RESTASIS) 0.05 % ophthalmic emulsion Place 1 drop into both eyes 2 (two) times daily.     [provider]  famotidine (PEPCID) 20 MG tablet Take 1 tablet (20 mg total) by mouth daily. 03/16/16   Lavina Hamman, MD  furosemide (LASIX) 20 MG tablet Take 1 tablet daily as needed may take 2 tablets prn 11/10/16   Arnoldo Lenis, MD  HYDROcodone-acetaminophen (NORCO/VICODIN) 5-325 MG tablet 1 or 2 tabs PO q6 hours prn pain 11/03/16   Francine Graven, DO  ibuprofen (ADVIL,MOTRIN) 800 MG tablet Take 1 tablet by mouth 2 (two) times daily as needed (back pain).  08/01/14   [provider]  isosorbide mononitrate (IMDUR) 30 MG 24 hr tablet TAKE  1 TABLET (30 MG TOTAL) BY MOUTH DAILY. 03/07/16   Arnoldo Lenis, MD  loratadine (CLARITIN) 10 MG tablet Take 10 mg by mouth daily as needed (seasonal allergies).  02/09/16   [provider]  losartan (COZAAR) 100 MG tablet TAKE ONE TABLET BY MOUTH DAILY FOR BLOOD PRESSURE 03/11/15   [provider]  meloxicam (MOBIC) 7.5 MG tablet Take 7.5 mg by mouth daily at 12 noon.  03/10/15   [provider]  methocarbamol (ROBAXIN) 500 MG tablet Take 2 tablets (1,000 mg total) by mouth 4 (four) times daily as needed for muscle spasms (muscle spasm/pain). 11/03/16   Francine Graven, DO  metoprolol tartrate (LOPRESSOR) 25 MG tablet Take 1.5 tablets (37.5 mg total) by mouth 2 (two) times daily. 11/10/16 02/08/17  Arnoldo Lenis, MD  nitroGLYCERIN (NITROSTAT) 0.4 MG SL tablet Place 1 tablet (0.4 mg total) under the tongue every 5 (five) minutes as needed for chest pain. 09/09/15   Imogene Burn, PA-C  ondansetron (ZOFRAN) 4 MG tablet Take 1 tablet (4 mg total) by mouth every 6 (six) hours. 11/03/16   Long, Wonda Olds, MD  ondansetron (ZOFRAN-ODT) 4 MG disintegrating tablet Take 1 tablet (4 mg total) by mouth every 8 (eight) hours as needed for nausea or vomiting. 03/15/16   Lavina Hamman, MD  polyethylene glycol [facility] / Floria Raveling) packet Take 17 g by mouth daily. 03/15/16   Lavina Hamman, MD  potassium chloride (K-DUR) 10 MEQ tablet Starting 05/21/16, take 20 meq ( 2 tablets) twice a day for 4 days, THEN, go back to 10 meq ( 1 tablet ) twice a day 05/20/16   Arnoldo Lenis, MD  predniSONE (DELTASONE) 10 MG tablet Take 2 tablets (20 mg total) by mouth 2 (two) times daily. 12/22/16   Daleen Bo, MD  senna-docusate (SENOKOT-S) 8.6-50 MG tablet Take 1 tablet by mouth at bedtime as needed for mild constipation. 03/15/16   Lavina Hamman, MD  spironolactone (ALDACTONE) 25 MG tablet Take 1 tablet (25 mg total) by mouth 2 (two) times daily. 10/19/15   Lendon Colonel, NP    umeclidinium-vilanterol (ANORO ELLIPTA) 62.5-25 MCG/INH AEPB Inhale 1 puff into the lungs daily as needed (shortness of breath/ wheezing).    [provider]    Family History Family History  Problem Relation Age of Onset  . Heart attack Mother 49  . Heart attack Sister 5    Social History Social History  Substance Use Topics  . Smoking status: Never Smoker  . Smokeless tobacco: Never Used  . Alcohol use No     Allergies   Other   Review of Systems Review of Systems  All other systems reviewed and are negative.    Physical  Exam Updated Vital Signs BP (!) 190/101 (BP Location: Right Arm) Comment: pt is due to take HTN meds tonight  Pulse (!) 57   Temp 98.8 F (37.1 C) (Oral)   Resp 16   Ht 5\' 5"  (1.651 m)   Wt 93 kg (205 lb)   SpO2 100%   BMI 34.11 kg/m   Physical Exam  Constitutional: She is oriented to person, place, and time. She appears well-developed and well-nourished. No distress.  HENT:  Head: Normocephalic and atraumatic.  Mild swelling lips bilaterally upper and lower.  No associated oral angioedema.  Mild crusting of secretions on lips.  No trismus.  Airway intact.  Eyes: Pupils are equal, round, and reactive to light. Conjunctivae and EOM are normal.  Neck: Normal range of motion and phonation normal. Neck supple.  Cardiovascular: Normal rate and regular rhythm.   Pulmonary/Chest: Effort normal and breath sounds normal. She exhibits no tenderness.  Abdominal: Soft. She exhibits no distension. There is no tenderness. There is no guarding.  Musculoskeletal: Normal range of motion.  Neurological: She is alert and oriented to person, place, and time. She exhibits normal muscle tone.  Skin: Skin is warm and dry.  Nonspecific scattered minute papules of forearms, bilaterally.  No associated petechiae, vesicles, or urticaria.  Psychiatric: She has a normal mood and affect. Her behavior is normal. Judgment and thought content normal.  Nursing  note and vitals reviewed.    ED Treatments / Results  Labs (all labs ordered are listed, but only abnormal results are displayed) Labs Reviewed - No data to display  EKG  EKG Interpretation None       Radiology No results found.  Procedures Procedures (including critical care time)  Medications Ordered in ED Medications  predniSONE (DELTASONE) tablet 60 mg (60 mg Oral Given 12/22/16 2231)     Initial Impression / Assessment and Plan / ED Course  I have reviewed the triage vital signs and the nursing notes.  Pertinent labs & imaging results that were available during my care of the patient were reviewed by me and considered in my medical decision making (see chart for details).      Patient Vitals for the past 24 hrs:  BP Temp Temp src Pulse Resp SpO2 Height Weight  12/22/16 2229 (!) 190/101 - - (!) 57 16 100 % - -  12/22/16 1930 (!) 189/96 98.8 F (37.1 C) Oral 68 16 98 % 5\' 5"  (1.651 m) 93 kg (205 lb)    At discharge- reevaluation with update and discussion. After initial assessment and treatment, an updated evaluation reveals she remains comfortable and has no worsening swelling.  Findings discussed with patient and all questions answered. Shataya Winkles L      Final Clinical Impressions(s) / ED Diagnoses   Final diagnoses:  Allergic reaction, initial encounter   Apparent contact dermatitis.  Doubt significant angioedema, or respiratory compromise.  Nursing Notes Reviewed/ Care Coordinated Applicable Imaging Reviewed Interpretation of Laboratory Data incorporated into ED treatment  The patient appears reasonably screened and/or stabilized for discharge and I doubt any other medical condition or other [facility] requiring further screening, evaluation, or treatment in the ED at this time prior to discharge.  Plan: Home Medications-OTC antihistamine of choice, continue current medications; Home Treatments-rest, fluids; return here if the recommended treatment, does  not improve the symptoms; Recommended follow up-PCP, as needed    New Prescriptions Discharge Medication List as of 12/22/2016 10:58 PM    START taking these medications   Details  predniSONE (DELTASONE) 10 MG tablet Take 2 tablets (20 mg total) by mouth 2 (two) times daily., Starting Thu 12/22/2016, Print         Daleen Bo, MD 12/23/16 [phone]

## 2017-02-15 ENCOUNTER — Emergency Department (HOSPITAL_COMMUNITY)
Admission: EM | Admit: 2017-02-15 | Discharge: 2017-02-15 | Disposition: A | Discharge status: Home / Self Care | Payer: PRIVATE HEALTH INSURANCE | Attending: Emergency Medicine | Admitting: Emergency Medicine

## 2017-02-15 ENCOUNTER — Encounter (HOSPITAL_COMMUNITY): Payer: Self-pay | Admitting: Emergency Medicine

## 2017-02-15 ENCOUNTER — Emergency Department (HOSPITAL_COMMUNITY): Payer: PRIVATE HEALTH INSURANCE

## 2017-02-15 DIAGNOSIS — R52 Pain, unspecified: Secondary | ICD-10-CM | POA: Diagnosis present

## 2017-02-15 DIAGNOSIS — Z7982 Long term (current) use of aspirin: Secondary | ICD-10-CM | POA: Insufficient documentation

## 2017-02-15 DIAGNOSIS — J449 Chronic obstructive pulmonary disease, unspecified: Secondary | ICD-10-CM | POA: Diagnosis not present

## 2017-02-15 DIAGNOSIS — I10 Essential (primary) hypertension: Secondary | ICD-10-CM | POA: Diagnosis not present

## 2017-02-15 DIAGNOSIS — Z79899 Other long term (current) drug therapy: Secondary | ICD-10-CM | POA: Insufficient documentation

## 2017-02-15 DIAGNOSIS — J40 Bronchitis, not specified as acute or chronic: Secondary | ICD-10-CM | POA: Insufficient documentation

## 2017-02-15 LAB — RAPID STREP SCREEN (MED CTR MEBANE ONLY): Streptococcus, Group A Screen (Direct): NEGATIVE

## 2017-02-15 MED ORDER — ALBUTEROL SULFATE (2.5 MG/3ML) 0.083% IN NEBU
2.5000 mg | INHALATION_SOLUTION | Freq: Once | RESPIRATORY_TRACT | Status: AC
  Administered 2017-02-15: 2.5 mg via RESPIRATORY_TRACT
  Filled 2017-02-15: qty 3

## 2017-02-15 MED ORDER — PREDNISONE 20 MG PO TABS
40.0000 mg | ORAL_TABLET | Freq: Once | ORAL | Status: AC
  Administered 2017-02-15: 40 mg via ORAL
  Filled 2017-02-15: qty 2

## 2017-02-15 MED ORDER — IPRATROPIUM-ALBUTEROL 0.5-2.5 (3) MG/3ML IN SOLN
3.0000 mL | Freq: Once | RESPIRATORY_TRACT | Status: AC
  Administered 2017-02-15: 3 mL via RESPIRATORY_TRACT
  Filled 2017-02-15: qty 3

## 2017-02-15 MED ORDER — PREDNISONE 20 MG PO TABS: 40.0000 mg | ORAL_TABLET | Freq: Every day | ORAL | 0 refills | Status: DC

## 2017-02-15 NOTE — Discharge Instructions (Signed)
Continue your albuterol nebulizer treatments 3-4 times per day for 3 days.  Then use as needed.  Follow-up with your doctor or return to the ER for any worsening symptoms.

## 2017-02-15 NOTE — ED Notes (Signed)
ED Provider at bedside. 

## 2017-02-15 NOTE — ED Provider Notes (Signed)
[facility] EMERGENCY DEPARTMENT Provider Note   CSN: [id number] Arrival date & time: 02/15/17  1119     History   Chief Complaint Chief Complaint  Patient presents with  . Generalized Body Aches    HPI Hannah Cooper is a 61 y.o. female.  HPI   Hannah Cooper is a 61 y.o. female who presents to the Emergency Department complaining of gradual onset of generalized body aches, cough, chills, sweats, and sore throat.  Symptoms began 2 days ago.  She reports having history of COPD and uses albuterol nebulizer treatments at home.  Since she has been ill she has been using her albuterol more frequently.  She has been taking ibuprofen for her symptoms with some relief.  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  Cough is occasionally productive.  She denies chest pain, shortness of breath, lower extremity edema, nausea vomiting and dysuria.  Past Medical History:  Diagnosis Date  . Arthritis   . CHF (congestive heart failure) ([place])   . Chronic back pain   . COPD (chronic obstructive pulmonary disease) ([place])   . Hyperlipidemia   . Hypertension   . Lumbar radiculopathy   . Meningitis     Patient Active Problem List   Diagnosis Date Noted  . Left-sided weakness 03/13/2016  . Cerebrovascular accident (CVA) ([place])   . Palpitations   . COPD (chronic obstructive pulmonary disease) ([place]) 08/20/2014  . Dyspnea 12/19/2013  . Chronic diastolic CHF (congestive heart failure) ([place]) 07/05/2013  . Lower extremity edema 07/05/2013  . Hyperlipidemia   . Chest pain 07/04/2013  . Hypokalemia 09/04/2012  . Precordial pain 09/04/2012  . Viral meningitis 01/22/2011  . Vomiting 01/22/2011  . PNEUMONIA, LEFT LOWER LOBE 10/10/2006  . DISEASE, ACUTE BRONCHOSPASM 10/10/2006  . Essential hypertension 05/23/2006  . OSTEOARTHRITIS 05/23/2006    Past Surgical History:  Procedure Laterality Date  . ABDOMINAL HYSTERECTOMY    . LEFT HEART CATHETERIZATION WITH  CORONARY ANGIOGRAM N/A 09/05/2012   Procedure: LEFT HEART CATHETERIZATION WITH CORONARY ANGIOGRAM;  Surgeon: Wellington Hampshire, MD;  Location: [place] CATH LAB;  Service: Cardiovascular;  Laterality: N/A;  . THYROID SURGERY  2002    OB History    Gravida Para Term Preterm AB Living             0   SAB TAB Ectopic Multiple Live Births                   Home Medications    Prior to Admission medications   Medication Sig Start Date End Date Taking? Authorizing Provider  acetaminophen (TYLENOL) 500 MG tablet Take 1,000 mg by mouth every 6 (six) hours as needed for headache (pain).     [provider]  albuterol (PROVENTIL) (2.5 MG/3ML) 0.083% nebulizer solution Take 2.5 mg by nebulization every 6 (six) hours as needed for wheezing or shortness of breath.    [provider]  Albuterol Sulfate (PROAIR RESPICLICK) 630 (90 Base) MCG/ACT AEPB Inhale 2 puffs into the lungs every 4 (four) hours as needed (shortness of breath/ wheezing).    [provider]  amLODipine (NORVASC) 10 MG tablet Take 1 tablet (10 mg total) by mouth daily. 06/17/15   Arnoldo Lenis, MD  aspirin 325 MG tablet Take 1 tablet (325 mg total) by mouth daily. 03/16/16   Lavina Hamman, MD  atorvastatin (LIPITOR) 20 MG tablet Take 20 mg by mouth daily.     [provider]  chlorthalidone (HYGROTON) 25 MG tablet Take 1 tablet (25 mg total) by mouth daily. 10/06/15   Arnoldo Lenis, MD  cyclobenzaprine (FLEXERIL) 10 MG tablet Take 10 mg by mouth 3 (three) times daily as needed for muscle spasms.    [provider]  cycloSPORINE (RESTASIS) 0.05 % ophthalmic emulsion Place 1 drop into both eyes 2 (two) times daily.     [provider]  famotidine (PEPCID) 20 MG tablet Take 1 tablet (20 mg total) by mouth daily. 03/16/16   Lavina Hamman, MD  furosemide (LASIX) 20 MG tablet Take 1 tablet daily as needed may take 2 tablets prn 11/10/16   Arnoldo Lenis, MD    HYDROcodone-acetaminophen (NORCO/VICODIN) 5-325 MG tablet 1 or 2 tabs PO q6 hours prn pain 11/03/16   Francine Graven, DO  ibuprofen (ADVIL,MOTRIN) 800 MG tablet Take 1 tablet by mouth 2 (two) times daily as needed (back pain).  08/01/14   [provider]  isosorbide mononitrate (IMDUR) 30 MG 24 hr tablet TAKE 1 TABLET (30 MG TOTAL) BY MOUTH DAILY. 03/07/16   Arnoldo Lenis, MD  loratadine (CLARITIN) 10 MG tablet Take 10 mg by mouth daily as needed (seasonal allergies).  02/09/16   [provider]  losartan (COZAAR) 100 MG tablet TAKE ONE TABLET BY MOUTH DAILY FOR BLOOD PRESSURE 03/11/15   [provider]  meloxicam (MOBIC) 7.5 MG tablet Take 7.5 mg by mouth daily at 12 noon.  03/10/15   [provider]  methocarbamol (ROBAXIN) 500 MG tablet Take 2 tablets (1,000 mg total) by mouth 4 (four) times daily as needed for muscle spasms (muscle spasm/pain). 11/03/16   Francine Graven, DO  metoprolol tartrate (LOPRESSOR) 25 MG tablet Take 1.5 tablets (37.5 mg total) by mouth 2 (two) times daily. 11/10/16 02/08/17  Arnoldo Lenis, MD  nitroGLYCERIN (NITROSTAT) 0.4 MG SL tablet Place 1 tablet (0.4 mg total) under the tongue every 5 (five) minutes as needed for chest pain. 09/09/15   Imogene Burn, PA-C  ondansetron (ZOFRAN) 4 MG tablet Take 1 tablet (4 mg total) by mouth every 6 (six) hours. 11/03/16   Long, Wonda Olds, MD  ondansetron (ZOFRAN-ODT) 4 MG disintegrating tablet Take 1 tablet (4 mg total) by mouth every 8 (eight) hours as needed for nausea or vomiting. 03/15/16   Lavina Hamman, MD  polyethylene glycol [facility] / Floria Raveling) packet Take 17 g by mouth daily. 03/15/16   Lavina Hamman, MD  potassium chloride (K-DUR) 10 MEQ tablet Starting 05/21/16, take 20 meq ( 2 tablets) twice a day for 4 days, THEN, go back to 10 meq ( 1 tablet ) twice a day 05/20/16   Arnoldo Lenis, MD  predniSONE (DELTASONE) 10 MG tablet Take 2 tablets (20 mg total) by mouth 2 (two)  times daily. 12/22/16   Daleen Bo, MD  senna-docusate (SENOKOT-S) 8.6-50 MG tablet Take 1 tablet by mouth at bedtime as needed for mild constipation. 03/15/16   Lavina Hamman, MD  spironolactone (ALDACTONE) 25 MG tablet Take 1 tablet (25 mg total) by mouth 2 (two) times daily. 10/19/15   Lendon Colonel, NP  umeclidinium-vilanterol (ANORO ELLIPTA) 62.5-25 MCG/INH AEPB Inhale 1 puff into the lungs daily as needed (shortness of breath/ wheezing).    [provider]    Family History Family History  Problem Relation Age of Onset  . Heart attack Mother 62  . Heart attack Sister 87    Social History Social History  Substance Use Topics  . Smoking status: Never Smoker  . Smokeless tobacco: Never Used  . Alcohol use No     Allergies   Other   Review of Systems Review of Systems  Constitutional: Positive for chills and fever. Negative for appetite change.  HENT: Positive for congestion and sore throat. Negative for trouble swallowing.   Respiratory: Positive for cough and wheezing. Negative for chest tightness and shortness of breath.   Cardiovascular: Negative for chest pain.  Gastrointestinal: Negative for abdominal pain, nausea and vomiting.  Genitourinary: Negative for dysuria.  Musculoskeletal: Positive for myalgias. Negative for arthralgias, neck pain and neck stiffness.  Skin: Negative for rash.  Neurological: Negative for dizziness, syncope, weakness and numbness.  Hematological: Negative for adenopathy.  All other systems reviewed and are negative.    Physical Exam Updated Vital Signs BP (!) 169/94 (BP Location: Left Arm)   Pulse 78   Temp 98.7 F (37.1 C) (Oral)   Resp 18   Ht 5\' 5"  (1.651 m)   Wt 97.5 kg (215 lb)   SpO2 99%   BMI 35.78 kg/m   Physical Exam  Constitutional: She is oriented to person, place, and time. She appears well-developed and well-nourished. No distress.  HENT:  Head: Normocephalic and atraumatic.  Right Ear: Tympanic  membrane and ear canal normal.  Left Ear: Tympanic membrane and ear canal normal.  Nose: Mucosal edema and rhinorrhea present.  Mouth/Throat: Uvula is midline and mucous membranes are normal. No trismus in the jaw. No uvula swelling. Posterior oropharyngeal erythema present. No oropharyngeal exudate, posterior oropharyngeal edema or tonsillar abscesses.  Eyes: Conjunctivae are normal.  Neck: Normal range of motion and phonation normal. Neck supple. No Brudzinski's sign and no Kernig's sign noted.  Cardiovascular: Normal rate, regular rhythm and intact distal pulses.   No murmur heard. Pulmonary/Chest: Effort normal. No respiratory distress. She has wheezes. She has no rales.  Slightly diminished lung sounds on the left.  Few expiratory wheezes present.  No rales.  Abdominal: Soft. She exhibits no distension. There is no tenderness. There is no rebound and no guarding.  Musculoskeletal: She exhibits no edema.  Lymphadenopathy:    She has no cervical adenopathy.  Neurological: She is alert and oriented to person, place, and time. No sensory deficit. She exhibits normal muscle tone. Coordination normal.  Skin: Skin is warm and dry. Capillary refill takes less than 2 seconds.  Nursing note and vitals reviewed.    ED Treatments / Results  Labs (all labs ordered are listed, but only abnormal results are displayed) Labs Reviewed  RAPID STREP SCREEN (NOT AT [facility])  CULTURE, GROUP A STREP [facility])    EKG  EKG Interpretation None       Radiology Dg Chest 2 View  Result Date: 02/15/2017 CLINICAL DATA:  Cough for 2 days EXAM: CHEST  2 VIEW COMPARISON:  03/13/2016 FINDINGS: Mild peribronchial thickening. Right basilar atelectasis. Left lung is clear. Heart is normal size. No effusions or acute bony abnormality. IMPRESSION: Bronchitic changes.  Right base atelectasis. Electronically Signed   By: Rolm Baptise M.D.   On: 02/15/2017 12:11    Procedures Procedures (including critical care  time)  Medications Ordered in ED Medications  ipratropium-albuterol (DUONEB) 0.5-2.5 (3) MG/3ML nebulizer solution 3 mL (3 mLs Nebulization Given 02/15/17 1235)  albuterol (PROVENTIL) (2.5 MG/3ML) 0.083% nebulizer solution 2.5 mg (2.5 mg Nebulization Given 02/15/17 1235)  predniSONE (DELTASONE) tablet 40 mg (40 mg Oral Given 02/15/17 1222)     Initial Impression /  Assessment and Plan / ED Course  I have reviewed the triage vital signs and the nursing notes.  Pertinent labs & imaging results that were available during my care of the patient were reviewed by me and considered in my medical decision making (see chart for details).      Pt well appearing, non-toxic.  No respir distress noted  1310 patient reports feeling better after nebulizer treatment.  Symptoms are likely viral.  She appears stable for discharge, agrees to PCP follow-up if needed in the ER return for any worsening symptoms.  Final Clinical Impressions(s) / ED Diagnoses   Final diagnoses:  Bronchitis    New Prescriptions New Prescriptions   No medications on file     Kem Parkinson, PA-C 02/15/17 1320    LongWonda Olds, MD 02/15/17 1540

## 2017-02-15 NOTE — ED Notes (Signed)
Patient transported to X-ray 

## 2017-02-15 NOTE — ED Notes (Signed)
Patient back from  X-ray 

## 2017-02-15 NOTE — ED Triage Notes (Signed)
Pt reports generalized body aches, sore throat, cough, and chills. nad noted. Pt reports last took tylenol and ibuprofen x2 hours ago.

## 2017-02-17 LAB — CULTURE, GROUP A STREP (THRC)

## 2017-04-19 ENCOUNTER — Other Ambulatory Visit: Payer: Self-pay | Admitting: Cardiology

## 2017-07-01 ENCOUNTER — Encounter (HOSPITAL_COMMUNITY): Payer: Self-pay | Admitting: Emergency Medicine

## 2017-07-01 ENCOUNTER — Emergency Department (HOSPITAL_COMMUNITY)
Admission: EM | Admit: 2017-07-01 | Discharge: 2017-07-01 | Disposition: A | Discharge status: Home / Self Care | Payer: Self-pay | Attending: Emergency Medicine | Admitting: Emergency Medicine

## 2017-07-01 ENCOUNTER — Emergency Department (HOSPITAL_COMMUNITY): Payer: Self-pay

## 2017-07-01 ENCOUNTER — Other Ambulatory Visit: Payer: Self-pay

## 2017-07-01 DIAGNOSIS — I110 Hypertensive heart disease with heart failure: Secondary | ICD-10-CM | POA: Insufficient documentation

## 2017-07-01 DIAGNOSIS — Z7982 Long term (current) use of aspirin: Secondary | ICD-10-CM | POA: Insufficient documentation

## 2017-07-01 DIAGNOSIS — J111 Influenza due to unidentified influenza virus with other respiratory manifestations: Secondary | ICD-10-CM | POA: Insufficient documentation

## 2017-07-01 DIAGNOSIS — Z79899 Other long term (current) drug therapy: Secondary | ICD-10-CM | POA: Insufficient documentation

## 2017-07-01 DIAGNOSIS — R69 Illness, unspecified: Secondary | ICD-10-CM

## 2017-07-01 DIAGNOSIS — I5032 Chronic diastolic (congestive) heart failure: Secondary | ICD-10-CM | POA: Insufficient documentation

## 2017-07-01 DIAGNOSIS — J449 Chronic obstructive pulmonary disease, unspecified: Secondary | ICD-10-CM | POA: Insufficient documentation

## 2017-07-01 LAB — INFLUENZA PANEL BY PCR (TYPE A & B)
INFLBPCR: NEGATIVE
Influenza A By PCR: NEGATIVE

## 2017-07-01 NOTE — Discharge Instructions (Signed)
Rest and make sure you are drinking plenty of fluids.  I recommend tylenol for body aches and fever.  Your influenza screen is negative as is your chest xray. Get rechecked if you have any worsening or persistent symptoms. Your blood pressure remains elevated tonight.  Make sure you take your night time medicines for your blood pressure when you get home.

## 2017-07-01 NOTE — ED Provider Notes (Signed)
[facility] EMERGENCY DEPARTMENT Provider Note   CSN: [id number] Arrival date & time: 07/01/17  1844     History   Chief Complaint Chief Complaint  Patient presents with  . Generalized Body Aches    HPI Hannah Cooper is a 62 y.o. female with a history of CHF, COPD and HTN presenting with a one day history of flu like symptoms describing generalized body aches with intermittent chills, nonproductive cough, nasal drainage and fever to 103 yesterday evening.  She denies sob, chest pain, wheezing, n/v, abdominal pain and no diarrhea or dysuria.  She reports her breathing is at baseline.  She does endorse mild headache, denies neck pain or stiffness.  She has taken tylenol flu formula prior to arrival with improvement in symptoms.  She denies any known exposure to influenza.  The history is provided by the patient.    Past Medical History:  Diagnosis Date  . Arthritis   . CHF (congestive heart failure) ([place])   . Chronic back pain   . COPD (chronic obstructive pulmonary disease) ([place])   . Hyperlipidemia   . Hypertension   . Lumbar radiculopathy   . Meningitis     Patient Active Problem List   Diagnosis Date Noted  . Left-sided weakness 03/13/2016  . Cerebrovascular accident (CVA) ([place])   . Palpitations   . COPD (chronic obstructive pulmonary disease) ([place]) 08/20/2014  . Dyspnea 12/19/2013  . Chronic diastolic CHF (congestive heart failure) ([place]) 07/05/2013  . Lower extremity edema 07/05/2013  . Hyperlipidemia   . Chest pain 07/04/2013  . Hypokalemia 09/04/2012  . Precordial pain 09/04/2012  . Viral meningitis 01/22/2011  . Vomiting 01/22/2011  . PNEUMONIA, LEFT LOWER LOBE 10/10/2006  . DISEASE, ACUTE BRONCHOSPASM 10/10/2006  . Essential hypertension 05/23/2006  . OSTEOARTHRITIS 05/23/2006    Past Surgical History:  Procedure Laterality Date  . ABDOMINAL HYSTERECTOMY    . LEFT HEART CATHETERIZATION WITH CORONARY ANGIOGRAM N/A 09/05/2012   Procedure: LEFT HEART  CATHETERIZATION WITH CORONARY ANGIOGRAM;  Surgeon: Wellington Hampshire, MD;  Location: [place] CATH LAB;  Service: Cardiovascular;  Laterality: N/A;  . THYROID SURGERY  2002    OB History    Gravida Para Term Preterm AB Living             0   SAB TAB Ectopic Multiple Live Births                   Home Medications    Prior to Admission medications   Medication Sig Start Date End Date Taking? Authorizing Provider  acetaminophen (TYLENOL) 500 MG tablet Take 1,000 mg by mouth every 6 (six) hours as needed for headache (pain).     [provider]  albuterol (PROVENTIL) (2.5 MG/3ML) 0.083% nebulizer solution Take 2.5 mg by nebulization every 6 (six) hours as needed for wheezing or shortness of breath.    [provider]  Albuterol Sulfate (PROAIR RESPICLICK) 355 (90 Base) MCG/ACT AEPB Inhale 2 puffs into the lungs every 4 (four) hours as needed (shortness of breath/ wheezing).    [provider]  amLODipine (NORVASC) 10 MG tablet Take 1 tablet (10 mg total) by mouth daily. 06/17/15   Arnoldo Lenis, MD  aspirin 325 MG tablet Take 1 tablet (325 mg total) by mouth daily. 03/16/16   Lavina Hamman, MD  atorvastatin (LIPITOR) 20 MG tablet Take 20 mg by mouth daily.     [provider]  chlorthalidone (HYGROTON) 25 MG tablet Take 1 tablet (  25 mg total) by mouth daily. 10/06/15   Arnoldo Lenis, MD  cyclobenzaprine (FLEXERIL) 10 MG tablet Take 10 mg by mouth 3 (three) times daily as needed for muscle spasms.    [provider]  cycloSPORINE (RESTASIS) 0.05 % ophthalmic emulsion Place 1 drop into both eyes 2 (two) times daily.     [provider]  famotidine (PEPCID) 20 MG tablet Take 1 tablet (20 mg total) by mouth daily. 03/16/16   Lavina Hamman, MD  furosemide (LASIX) 20 MG tablet Take 1 tablet daily as needed may take 2 tablets prn 11/10/16   Arnoldo Lenis, MD  HYDROcodone-acetaminophen (NORCO/VICODIN) 5-325 MG tablet 1 or 2 tabs PO q6  hours prn pain 11/03/16   Francine Graven, DO  ibuprofen (ADVIL,MOTRIN) 800 MG tablet Take 1 tablet by mouth 2 (two) times daily as needed (back pain).  08/01/14   [provider]  isosorbide mononitrate (IMDUR) 30 MG 24 hr tablet TAKE 1 TABLET (30 MG TOTAL) BY MOUTH DAILY. 04/19/17   Satira Sark, MD  loratadine (CLARITIN) 10 MG tablet Take 10 mg by mouth daily as needed (seasonal allergies).  02/09/16   [provider]  losartan (COZAAR) 100 MG tablet TAKE ONE TABLET BY MOUTH DAILY FOR BLOOD PRESSURE 03/11/15   [provider]  meloxicam (MOBIC) 7.5 MG tablet Take 7.5 mg by mouth daily at 12 noon.  03/10/15   [provider]  methocarbamol (ROBAXIN) 500 MG tablet Take 2 tablets (1,000 mg total) by mouth 4 (four) times daily as needed for muscle spasms (muscle spasm/pain). 11/03/16   Francine Graven, DO  metoprolol tartrate (LOPRESSOR) 25 MG tablet Take 1.5 tablets (37.5 mg total) by mouth 2 (two) times daily. 11/10/16 02/08/17  Arnoldo Lenis, MD  nitroGLYCERIN (NITROSTAT) 0.4 MG SL tablet Place 1 tablet (0.4 mg total) under the tongue every 5 (five) minutes as needed for chest pain. 09/09/15   Imogene Burn, PA-C  ondansetron (ZOFRAN) 4 MG tablet Take 1 tablet (4 mg total) by mouth every 6 (six) hours. 11/03/16   Long, Wonda Olds, MD  ondansetron (ZOFRAN-ODT) 4 MG disintegrating tablet Take 1 tablet (4 mg total) by mouth every 8 (eight) hours as needed for nausea or vomiting. 03/15/16   Lavina Hamman, MD  polyethylene glycol [facility] / Floria Raveling) packet Take 17 g by mouth daily. 03/15/16   Lavina Hamman, MD  potassium chloride (K-DUR) 10 MEQ tablet Starting 05/21/16, take 20 meq ( 2 tablets) twice a day for 4 days, THEN, go back to 10 meq ( 1 tablet ) twice a day 05/20/16   Arnoldo Lenis, MD  predniSONE (DELTASONE) 20 MG tablet Take 2 tablets (40 mg total) by mouth daily. 02/15/17   Triplett, Tammy, PA-C  senna-docusate (SENOKOT-S) 8.6-50 MG tablet Take  1 tablet by mouth at bedtime as needed for mild constipation. 03/15/16   Lavina Hamman, MD  spironolactone (ALDACTONE) 25 MG tablet Take 1 tablet (25 mg total) by mouth 2 (two) times daily. 10/19/15   Lendon Colonel, NP  umeclidinium-vilanterol (ANORO ELLIPTA) 62.5-25 MCG/INH AEPB Inhale 1 puff into the lungs daily as needed (shortness of breath/ wheezing).    [provider]    Family History Family History  Problem Relation Age of Onset  . Heart attack Mother 44  . Heart attack Sister 82    Social History Social History   Tobacco Use  . Smoking status: Never Smoker  . Smokeless tobacco: Never  Used  Substance Use Topics  . Alcohol use: No    Alcohol/week: 0.0 oz  . Drug use: No     Allergies   Other   Review of Systems Review of Systems  Constitutional: Positive for chills and fever.  HENT: Positive for rhinorrhea. Negative for congestion, ear pain, sinus pressure, sore throat, trouble swallowing and voice change.   Eyes: Negative for discharge.  Respiratory: Positive for cough. Negative for shortness of breath, wheezing and stridor.   Cardiovascular: Negative for chest pain.  Gastrointestinal: Negative for abdominal pain, diarrhea, nausea and vomiting.  Genitourinary: Negative.  Negative for dysuria.  Musculoskeletal: Positive for myalgias. Negative for back pain and neck pain.     Physical Exam Updated Vital Signs BP (!) 180/85 (BP Location: Left Arm)   Pulse 63   Temp 99.9 F (37.7 C) (Oral)   Resp 19   Ht 5\' 5"  (1.651 m)   Wt 102.1 kg (225 lb)   SpO2 98%   BMI 37.44 kg/m   Physical Exam  Constitutional: She appears well-developed and well-nourished.  HENT:  Head: Normocephalic and atraumatic.  Eyes: Conjunctivae are normal.  Neck: Normal range of motion.  Cardiovascular: Normal rate, regular rhythm, normal heart sounds and intact distal pulses.  Pulmonary/Chest: Effort normal and breath sounds normal. She has no wheezes.  Abdominal:  Soft. Bowel sounds are normal. There is no tenderness.  Musculoskeletal: Normal range of motion.  Neurological: She is alert.  Skin: Skin is warm and dry.  Psychiatric: She has a normal mood and affect.  Nursing note and vitals reviewed.    ED Treatments / Results  Labs (all labs ordered are listed, but only abnormal results are displayed) Labs Reviewed  INFLUENZA PANEL BY PCR (TYPE A & B)    EKG  EKG Interpretation None       Radiology Dg Chest 2 View  Result Date: 07/01/2017 CLINICAL DATA:  62 year old female with history of fever. Generalized body aches and chills. Cough. EXAM: CHEST - 2 VIEW COMPARISON:  Chest x-ray 02/15/2017. FINDINGS: Lung volumes are slightly low. Linear opacities throughout the lung bases bilaterally, similar to the prior study, compatible with areas of mild scarring. No acute consolidative airspace disease. No pleural effusions. No pneumothorax. No definite suspicious appearing pulmonary nodules or masses. No evidence of pulmonary edema. Heart size is normal. Upper mediastinal contours are within normal limits. Aortic atherosclerosis. IMPRESSION: 1. No radiographic evidence of acute cardiopulmonary disease. 2. Aortic atherosclerosis. Electronically Signed   By: Vinnie Langton M.D.   On: 07/01/2017 21:18    Procedures Procedures (including critical care time)  Medications Ordered in ED Medications - No data to display   Initial Impression / Assessment and Plan / ED Course  I have reviewed the triage vital signs and the nursing notes.  Pertinent labs & imaging results that were available during my care of the patient were reviewed by me and considered in my medical decision making (see chart for details).     Pt with flu like illness but negative influenza screen.  cxr clear. She appears fairly comfortable, no distress.  bp elevated, she endorses she has not taken her bp meds today, has missed her morning dose of metoprolol (which she has with  her) and she has several evening meds she has yet to take.  She took the metoprolol here, encouraged to take her other meds once home.  Advised rest, tylenol for fever and body aches, recheck here or by her pcp for any  new or worsening sx.  Suspect viral syndrome.    Final Clinical Impressions(s) / ED Diagnoses   Final diagnoses:  Influenza-like illness    ED Discharge Orders    None       Landis Martins 07/02/17 0054    Mesner, Corene Cornea, MD 07/02/17 1945

## 2017-07-01 NOTE — ED Triage Notes (Addendum)
PT c/o generalized body aches/chills, cough with fever that started around 1530 yesterday. PT states she took liquid tylenol/flu OTC medications prior to ED arrival.

## 2017-10-07 ENCOUNTER — Encounter (HOSPITAL_COMMUNITY): Payer: Self-pay | Admitting: Emergency Medicine

## 2017-10-07 ENCOUNTER — Emergency Department (HOSPITAL_COMMUNITY): Payer: No Typology Code available for payment source

## 2017-10-07 ENCOUNTER — Emergency Department (HOSPITAL_COMMUNITY)
Admission: EM | Admit: 2017-10-07 | Discharge: 2017-10-07 | Disposition: A | Discharge status: Home / Self Care | Payer: No Typology Code available for payment source | Attending: Emergency Medicine | Admitting: Emergency Medicine

## 2017-10-07 ENCOUNTER — Other Ambulatory Visit: Payer: Self-pay

## 2017-10-07 DIAGNOSIS — Z7982 Long term (current) use of aspirin: Secondary | ICD-10-CM | POA: Insufficient documentation

## 2017-10-07 DIAGNOSIS — Z79899 Other long term (current) drug therapy: Secondary | ICD-10-CM | POA: Diagnosis not present

## 2017-10-07 DIAGNOSIS — R002 Palpitations: Secondary | ICD-10-CM | POA: Diagnosis not present

## 2017-10-07 DIAGNOSIS — J449 Chronic obstructive pulmonary disease, unspecified: Secondary | ICD-10-CM | POA: Diagnosis not present

## 2017-10-07 DIAGNOSIS — I110 Hypertensive heart disease with heart failure: Secondary | ICD-10-CM | POA: Insufficient documentation

## 2017-10-07 DIAGNOSIS — I5032 Chronic diastolic (congestive) heart failure: Secondary | ICD-10-CM | POA: Insufficient documentation

## 2017-10-07 LAB — CBC
HEMATOCRIT: 36.3 % (ref 36.0–46.0)
HEMOGLOBIN: 11.9 g/dL — AB (ref 12.0–15.0)
MCH: 29.2 pg (ref 26.0–34.0)
MCHC: 32.8 g/dL (ref 30.0–36.0)
MCV: 89.2 fL (ref 78.0–100.0)
PLATELETS: 257 10*3/uL (ref 150–400)
RBC: 4.07 MIL/uL (ref 3.87–5.11)
RDW: 14.4 % (ref 11.5–15.5)
WBC: 4.7 10*3/uL (ref 4.0–10.5)

## 2017-10-07 LAB — TSH: TSH: 4.268 u[IU]/mL (ref 0.350–4.500)

## 2017-10-07 LAB — BASIC METABOLIC PANEL
ANION GAP: 8 (ref 5–15)
BUN: 12 mg/dL (ref 6–20)
CHLORIDE: 107 mmol/L (ref 101–111)
CO2: 25 mmol/L (ref 22–32)
Calcium: 8.9 mg/dL (ref 8.9–10.3)
Creatinine, Ser: 0.77 mg/dL (ref 0.44–1.00)
GFR calc Af Amer: >60 mL/min (ref >60)
GFR calc non Af Amer: >60 mL/min (ref >60)
Glucose, Bld: 131 mg/dL — ABNORMAL HIGH (ref 65–99)
POTASSIUM: 3.9 mmol/L (ref 3.5–5.1)
SODIUM: 140 mmol/L (ref 135–145)

## 2017-10-07 LAB — TROPONIN I: Troponin I: <0.03 ng/mL (ref <0.03)

## 2017-10-07 MED ORDER — METOPROLOL TARTRATE 50 MG PO TABS: 25.0000 mg | ORAL_TABLET | Freq: Two times a day (BID) | ORAL | 1 refills | Status: DC

## 2017-10-07 MED ORDER — FUROSEMIDE 10 MG/ML IJ SOLN
40.0000 mg | INTRAMUSCULAR | Status: AC
  Administered 2017-10-07: 40 mg via INTRAVENOUS
  Filled 2017-10-07: qty 4

## 2017-10-07 NOTE — Discharge Instructions (Signed)
Your testing has been overall very normal Your chest xray and your arm xray are normal Please take ibuprofen or naprosyn twice daily for your arm pain Metoprolol twice daily for help control your heart rate and your blood pressure You will need to follow up with your doctor to obtain a "heart monitor" test to make sure that you are not having any problems with your heart - we have not seen any problems with your heart today. You should come back to the ER immediately for severe or worsening palpitations / shortness of breath or any other severe or worsening symptoms.

## 2017-10-07 NOTE — ED Triage Notes (Signed)
Pt reports feeling like her heart has been racing for the past 2 days with increased sob, swelling in legs, and pain down left arm/shoulder.  States she has not been 100% compliant with meds due to recent insurance loss.

## 2017-10-07 NOTE — ED Provider Notes (Signed)
[facility] EMERGENCY DEPARTMENT Provider Note   CSN: [id number] Arrival date & time: 10/07/17  1849     History   Chief Complaint Chief Complaint  Patient presents with  . Palpitations    HPI Hannah Cooper is a 62 y.o. female.  HPI  The patient is a 62 year old female with a known history of congestive heart failure as well as a history of COPD, hypertension and a history of a stroke with some residual left-sided weakness.  She is overall extremely functional  Review of the medical record shows that the patient had her last echocardiogram in November 2017, ejection fraction was normal, systolic function was vigorous, there was grade 1 diastolic dysfunction  Heart catheterization from 2014 showed patent nonobstructed coronary arteries  Heart monitor from February 2018 showed predominantly sinus rhythm with occasional PVCs and PACs for which the patient had correlated symptoms.  The patient presents today with a complaint of palpitations which started earlier today, they seem to be lasting for long periods of time during which time she would become short of breath and lightheaded.  These seem to each resolved spontaneously, it is not clear how long they lasted, though she thinks it was probably between 1 and several minutes.  She is not actively having symptoms.  She does correlate that she has had some swollen lower extremities over the last couple of days more than usual.  She is known to have some type of congestive heart failure though she states that she is on medications for it including a diuretic and based on the medical record she takes both Lasix and Spironolactone.  She is also and amlodipine.  No fevers, no coughing, no chest pain, no headaches, no nausea vomiting or diarrhea, no rashes.  Past Medical History:  Diagnosis Date  . Arthritis   . CHF (congestive heart failure) ([place])   . Chronic back pain   . COPD (chronic obstructive pulmonary disease) ([place])   .  Hyperlipidemia   . Hypertension   . Lumbar radiculopathy   . Meningitis     Patient Active Problem List   Diagnosis Date Noted  . Left-sided weakness 03/13/2016  . Cerebrovascular accident (CVA) ([place])   . Palpitations   . COPD (chronic obstructive pulmonary disease) ([place]) 08/20/2014  . Dyspnea 12/19/2013  . Chronic diastolic CHF (congestive heart failure) ([place]) 07/05/2013  . Lower extremity edema 07/05/2013  . Hyperlipidemia   . Chest pain 07/04/2013  . Hypokalemia 09/04/2012  . Precordial pain 09/04/2012  . Viral meningitis 01/22/2011  . Vomiting 01/22/2011  . PNEUMONIA, LEFT LOWER LOBE 10/10/2006  . DISEASE, ACUTE BRONCHOSPASM 10/10/2006  . Essential hypertension 05/23/2006  . OSTEOARTHRITIS 05/23/2006    Past Surgical History:  Procedure Laterality Date  . ABDOMINAL HYSTERECTOMY    . LEFT HEART CATHETERIZATION WITH CORONARY ANGIOGRAM N/A 09/05/2012   Procedure: LEFT HEART CATHETERIZATION WITH CORONARY ANGIOGRAM;  Surgeon: Wellington Hampshire, MD;  Location: [facility] CATH LAB;  Service: Cardiovascular;  Laterality: N/A;  . THYROID SURGERY  2002     OB History    Gravida      Para      Term      Preterm      AB      Living  0     SAB      TAB      Ectopic      Multiple      Live Births  Home Medications    Prior to Admission medications   Medication Sig Start Date End Date Taking? Authorizing Provider  acetaminophen (TYLENOL) 500 MG tablet Take 1,000 mg by mouth every 6 (six) hours as needed for headache (pain).     [provider]  albuterol (PROVENTIL) (2.5 MG/3ML) 0.083% nebulizer solution Take 2.5 mg by nebulization every 6 (six) hours as needed for wheezing or shortness of breath.    [provider]  Albuterol Sulfate (PROAIR RESPICLICK) 381 (90 Base) MCG/ACT AEPB Inhale 2 puffs into the lungs every 4 (four) hours as needed (shortness of breath/ wheezing).    [provider]  amLODipine (NORVASC) 10 MG tablet  Take 1 tablet (10 mg total) by mouth daily. 06/17/15   Arnoldo Lenis, MD  aspirin 325 MG tablet Take 1 tablet (325 mg total) by mouth daily. 03/16/16   Lavina Hamman, MD  atorvastatin (LIPITOR) 20 MG tablet Take 20 mg by mouth daily.     [provider]  chlorthalidone (HYGROTON) 25 MG tablet Take 1 tablet (25 mg total) by mouth daily. 10/06/15   Arnoldo Lenis, MD  cyclobenzaprine (FLEXERIL) 10 MG tablet Take 10 mg by mouth 3 (three) times daily as needed for muscle spasms.    [provider]  cycloSPORINE (RESTASIS) 0.05 % ophthalmic emulsion Place 1 drop into both eyes 2 (two) times daily.     [provider]  famotidine (PEPCID) 20 MG tablet Take 1 tablet (20 mg total) by mouth daily. 03/16/16   Lavina Hamman, MD  furosemide (LASIX) 20 MG tablet Take 1 tablet daily as needed may take 2 tablets prn 11/10/16   Arnoldo Lenis, MD  HYDROcodone-acetaminophen (NORCO/VICODIN) 5-325 MG tablet 1 or 2 tabs PO q6 hours prn pain 11/03/16   Francine Graven, DO  ibuprofen (ADVIL,MOTRIN) 800 MG tablet Take 1 tablet by mouth 2 (two) times daily as needed (back pain).  08/01/14   [provider]  isosorbide mononitrate (IMDUR) 30 MG 24 hr tablet TAKE 1 TABLET (30 MG TOTAL) BY MOUTH DAILY. 04/19/17   Satira Sark, MD  loratadine (CLARITIN) 10 MG tablet Take 10 mg by mouth daily as needed (seasonal allergies).  02/09/16   [provider]  losartan (COZAAR) 100 MG tablet TAKE ONE TABLET BY MOUTH DAILY FOR BLOOD PRESSURE 03/11/15   [provider]  meloxicam (MOBIC) 7.5 MG tablet Take 7.5 mg by mouth daily at 12 noon.  03/10/15   [provider]  methocarbamol (ROBAXIN) 500 MG tablet Take 2 tablets (1,000 mg total) by mouth 4 (four) times daily as needed for muscle spasms (muscle spasm/pain). 11/03/16   Francine Graven, DO  metoprolol tartrate (LOPRESSOR) 50 MG tablet Take 0.5 tablets (25 mg total) by mouth 2 (two) times daily. 10/07/17    Noemi Chapel, MD  nitroGLYCERIN (NITROSTAT) 0.4 MG SL tablet Place 1 tablet (0.4 mg total) under the tongue every 5 (five) minutes as needed for chest pain. 09/09/15   Imogene Burn, PA-C  ondansetron (ZOFRAN) 4 MG tablet Take 1 tablet (4 mg total) by mouth every 6 (six) hours. 11/03/16   Long, Wonda Olds, MD  ondansetron (ZOFRAN-ODT) 4 MG disintegrating tablet Take 1 tablet (4 mg total) by mouth every 8 (eight) hours as needed for nausea or vomiting. 03/15/16   Lavina Hamman, MD  polyethylene glycol [facility] / Floria Raveling) packet Take 17 g by mouth daily. 03/15/16   Lavina Hamman, MD  potassium chloride (K-DUR) 10  MEQ tablet Starting 05/21/16, take 20 meq ( 2 tablets) twice a day for 4 days, THEN, go back to 10 meq ( 1 tablet ) twice a day 05/20/16   Arnoldo Lenis, MD  predniSONE (DELTASONE) 20 MG tablet Take 2 tablets (40 mg total) by mouth daily. 02/15/17   Triplett, Tammy, PA-C  senna-docusate (SENOKOT-S) 8.6-50 MG tablet Take 1 tablet by mouth at bedtime as needed for mild constipation. 03/15/16   Lavina Hamman, MD  spironolactone (ALDACTONE) 25 MG tablet Take 1 tablet (25 mg total) by mouth 2 (two) times daily. 10/19/15   Lendon Colonel, NP  umeclidinium-vilanterol (ANORO ELLIPTA) 62.5-25 MCG/INH AEPB Inhale 1 puff into the lungs daily as needed (shortness of breath/ wheezing).    [provider]    Family History Family History  Problem Relation Age of Onset  . Heart attack Mother 22  . Heart attack Sister 23    Social History Social History   Tobacco Use  . Smoking status: Never Smoker  . Smokeless tobacco: Never Used  Substance Use Topics  . Alcohol use: No    Alcohol/week: 0.0 oz  . Drug use: No     Allergies   Other   Review of Systems Review of Systems  All other systems reviewed and are negative.    Physical Exam Updated Vital Signs BP (!) 152/84   Pulse (!) 56   Temp 98.1 F (36.7 C) (Oral)   Resp 18   Ht 5\' 5"  (1.651 m)   Wt 99.8 kg  (220 lb)   SpO2 96%   BMI 36.61 kg/m   Physical Exam  Constitutional: She appears well-developed and well-nourished. No distress.  HENT:  Head: Normocephalic and atraumatic.  Mouth/Throat: Oropharynx is clear and moist. No oropharyngeal exudate.  Eyes: Pupils are equal, round, and reactive to light. Conjunctivae and EOM are normal. Right eye exhibits no discharge. Left eye exhibits no discharge. No scleral icterus.  Neck: Normal range of motion. Neck supple. No JVD present. No thyromegaly present.  Cardiovascular: Normal rate, normal heart sounds and intact distal pulses. Exam reveals no gallop and no friction rub.  No murmur heard. Normal sinus rhythm, rate of 80, occasional PACs, normal pulses, no JVD  Pulmonary/Chest: Effort normal and breath sounds normal. No respiratory distress. She has no wheezes. She has no rales.  Abdominal: Soft. Bowel sounds are normal. She exhibits no distension and no mass. There is no tenderness.  Musculoskeletal: Normal range of motion. She exhibits edema ( There is scant bilateral pitting edema, 0-1+, no asymmetry). She exhibits no tenderness.  Lymphadenopathy:    She has no cervical adenopathy.  Neurological: She is alert. Coordination normal.  Skin: Skin is warm and dry. No rash noted. No erythema.  Psychiatric: She has a normal mood and affect. Her behavior is normal.  Nursing note and vitals reviewed.    ED Treatments / Results  Labs (all labs ordered are listed, but only abnormal results are displayed) Labs Reviewed  BASIC METABOLIC PANEL - Abnormal; Notable for the following components:      Result Value   Glucose, Bld 131 (*)    All other components within normal limits  CBC - Abnormal; Notable for the following components:   Hemoglobin 11.9 (*)    All other components within normal limits  TROPONIN I  TSH    EKG EKG Interpretation  Date/Time:  Saturday October 07 2017 18:59:25 EDT Ventricular Rate:  78 PR Interval:    QRS  Duration: 67 QT Interval:  455 QTC Calculation: 519 R Axis:   32 Text Interpretation:  Sinus rhythm Atrial premature complexes Abnormal R-wave progression, early transition Consider anterior infarct Borderline T abnormalities, inferior leads Prolonged QT interval Since last tracing Premature atrial complexes NOW PRESENT Otherwise no significant change Confirmed by Noemi Chapel [phone]) on 10/07/2017 7:12:04 PM   Radiology Dg Chest 2 View  Result Date: 10/07/2017 CLINICAL DATA:  Palpitation EXAM: CHEST - 2 VIEW COMPARISON:  07/01/2017 FINDINGS: The heart size and mediastinal contours are within normal limits. Both lungs are clear. The visualized skeletal structures are unremarkable. Aortic atherosclerosis. IMPRESSION: No active cardiopulmonary disease. Electronically Signed   By: Donavan Foil M.D.   On: 10/07/2017 20:18   Dg Humerus Left  Result Date: 10/07/2017 CLINICAL DATA:  Pain in the upper arm EXAM: LEFT HUMERUS - 2+ VIEW COMPARISON:  None. FINDINGS: No fracture or malalignment. Mild AC joint degenerative change. Soft tissues are unremarkable. IMPRESSION: No acute osseous abnormality. Electronically Signed   By: Donavan Foil M.D.   On: 10/07/2017 20:19    Procedures Procedures (including critical care time)  Medications Ordered in ED Medications  furosemide (LASIX) injection 40 mg (40 mg Intravenous Given 10/07/17 1935)     Initial Impression / Assessment and Plan / ED Course  I have reviewed the triage vital signs and the nursing notes.  Pertinent labs & imaging results that were available during my care of the patient were reviewed by me and considered in my medical decision making (see chart for details).    EKG is reassuring, shows nonspecific T wave flattening diffusely but consistent with prior EKG.  She does have frequent PACs on the monitor, will check some basic labs including electrolytes, chest x-ray, cardiac monitoring and a troponin.  This does not seem to be  obstructive, we do not see any active arrhythmias but she is also not having active symptoms.  She will need some cardiac monitoring while in the emergency department.  Labs neg Monitoring neg CXR neg Pt agreeable to f/u States she has not been taking meds b/c of money issues Lopressor Rx given for inexpensive option - pt agreeable.  Final Clinical Impressions(s) / ED Diagnoses   Final diagnoses:  Palpitations    ED Discharge Orders        Ordered    metoprolol tartrate (LOPRESSOR) 50 MG tablet  2 times daily     10/07/17 2027       Noemi Chapel, MD 10/07/17 2029

## 2017-10-07 NOTE — ED Notes (Signed)
Out of bed to BR 

## 2017-10-17 ENCOUNTER — Other Ambulatory Visit: Payer: Self-pay

## 2017-10-17 ENCOUNTER — Encounter (HOSPITAL_COMMUNITY): Payer: Self-pay | Admitting: Emergency Medicine

## 2017-10-17 ENCOUNTER — Emergency Department (HOSPITAL_COMMUNITY)
Admission: EM | Admit: 2017-10-17 | Discharge: 2017-10-17 | Disposition: A | Discharge status: Home / Self Care | Payer: No Typology Code available for payment source | Attending: Emergency Medicine | Admitting: Emergency Medicine

## 2017-10-17 ENCOUNTER — Emergency Department (HOSPITAL_COMMUNITY): Payer: No Typology Code available for payment source

## 2017-10-17 DIAGNOSIS — J449 Chronic obstructive pulmonary disease, unspecified: Secondary | ICD-10-CM | POA: Insufficient documentation

## 2017-10-17 DIAGNOSIS — M79602 Pain in left arm: Secondary | ICD-10-CM | POA: Insufficient documentation

## 2017-10-17 DIAGNOSIS — Z79899 Other long term (current) drug therapy: Secondary | ICD-10-CM | POA: Insufficient documentation

## 2017-10-17 DIAGNOSIS — I5032 Chronic diastolic (congestive) heart failure: Secondary | ICD-10-CM | POA: Insufficient documentation

## 2017-10-17 DIAGNOSIS — M542 Cervicalgia: Secondary | ICD-10-CM | POA: Insufficient documentation

## 2017-10-17 DIAGNOSIS — I110 Hypertensive heart disease with heart failure: Secondary | ICD-10-CM | POA: Insufficient documentation

## 2017-10-17 HISTORY — DX: Other cervical disc degeneration, unspecified cervical region: M50.30

## 2017-10-17 HISTORY — DX: Palpitations: R00.2

## 2017-10-17 HISTORY — DX: Other chest pain: R07.89

## 2017-10-17 LAB — CBC WITH DIFFERENTIAL/PLATELET
BASOS ABS: 0 10*3/uL (ref 0.0–0.1)
Basophils Relative: 0 %
Eosinophils Absolute: 0.2 10*3/uL (ref 0.0–0.7)
Eosinophils Relative: 4 %
HEMATOCRIT: 35.8 % — AB (ref 36.0–46.0)
Hemoglobin: 11.6 g/dL — ABNORMAL LOW (ref 12.0–15.0)
LYMPHS ABS: 2.1 10*3/uL (ref 0.7–4.0)
LYMPHS PCT: 48 %
MCH: 29.2 pg (ref 26.0–34.0)
MCHC: 32.4 g/dL (ref 30.0–36.0)
MCV: 90.2 fL (ref 78.0–100.0)
MONO ABS: 0.3 10*3/uL (ref 0.1–1.0)
MONOS PCT: 7 %
NEUTROS ABS: 1.8 10*3/uL (ref 1.7–7.7)
Neutrophils Relative %: 41 %
Platelets: 254 10*3/uL (ref 150–400)
RBC: 3.97 MIL/uL (ref 3.87–5.11)
RDW: 14.6 % (ref 11.5–15.5)
WBC: 4.3 10*3/uL (ref 4.0–10.5)

## 2017-10-17 LAB — TROPONIN I: Troponin I: <0.03 ng/mL (ref <0.03)

## 2017-10-17 LAB — BASIC METABOLIC PANEL
ANION GAP: 5 (ref 5–15)
BUN: 16 mg/dL (ref 8–23)
CO2: 28 mmol/L (ref 22–32)
Calcium: 8.9 mg/dL (ref 8.9–10.3)
Chloride: 107 mmol/L (ref 98–111)
Creatinine, Ser: 0.73 mg/dL (ref 0.44–1.00)
GFR calc Af Amer: >60 mL/min (ref >60)
GLUCOSE: 105 mg/dL — AB (ref 70–99)
POTASSIUM: 3.9 mmol/L (ref 3.5–5.1)
Sodium: 140 mmol/L (ref 135–145)

## 2017-10-17 MED ORDER — HYDROCODONE-ACETAMINOPHEN 5-325 MG PO TABS: ORAL_TABLET | ORAL | 0 refills | Status: DC

## 2017-10-17 MED ORDER — IBUPROFEN 400 MG PO TABS
400.0000 mg | ORAL_TABLET | Freq: Once | ORAL | Status: AC
  Administered 2017-10-17: 400 mg via ORAL
  Filled 2017-10-17: qty 1

## 2017-10-17 MED ORDER — ACETAMINOPHEN 325 MG PO TABS
650.0000 mg | ORAL_TABLET | Freq: Once | ORAL | Status: AC
  Administered 2017-10-17: 650 mg via ORAL
  Filled 2017-10-17: qty 2

## 2017-10-17 MED ORDER — METHOCARBAMOL 500 MG PO TABS: 1000.0000 mg | ORAL_TABLET | Freq: Four times a day (QID) | ORAL | 0 refills | Status: DC | PRN

## 2017-10-17 MED ORDER — METHOCARBAMOL 500 MG PO TABS
750.0000 mg | ORAL_TABLET | Freq: Once | ORAL | Status: AC
  Administered 2017-10-17: 750 mg via ORAL
  Filled 2017-10-17: qty 2

## 2017-10-17 NOTE — ED Triage Notes (Signed)
Pt seen here for cp 1.5 weeks ago and had the left arm pain and numbness then. Pt states after taking ibuprofen with no relief. rom wnl. Nad. No cp at this time.

## 2017-10-17 NOTE — ED Notes (Signed)
Patient transported to CT 

## 2017-10-17 NOTE — Discharge Instructions (Signed)
Take the prescriptions as directed.  Apply moist heat or ice to the area(s) of discomfort, for 15 minutes at a time, several times per day for the next few days.  Do not fall asleep on a heating or ice pack.  Call your regular medical doctor tomorrow to schedule a follow up appointment within the next week.  Return to the Emergency Department immediately if worsening.

## 2017-10-17 NOTE — ED Provider Notes (Signed)
[facility] EMERGENCY DEPARTMENT Provider Note   CSN: [id number] Arrival date & time: 10/17/17  [place]     History   Chief Complaint Chief Complaint  Patient presents with  . Arm Pain    HPI Hannah Cooper is a 62 y.o. female.  HPI Pt was seen at 1900. Per pt, c/o gradual onset and persistence of constant waxing and waning left arm "pain" for the past 2 weeks. Describes the pain as located in her left forearm and radiating up to her left neck. Pain worsens with palpation of the area and laying on her left arm. Pt was evaluated in the ED 2 weeks ago for her symptoms and instructed to take NSAID. Pt has been taking without improvement. Denies injury, no rash, no fever, no focal motor weakness, no tingling/numbness in extremities, no abd pain, no N/V/D, no CP/palpitations, no SOB/cough.    Past Medical History:  Diagnosis Date  . Arthritis   . Atypical chest pain    chronic  . CHF (congestive heart failure) ([place])   . Chronic back pain   . COPD (chronic obstructive pulmonary disease) ([place])   . DDD (degenerative disc disease), cervical   . Hyperlipidemia   . Hypertension   . Lumbar radiculopathy   . Meningitis   . Palpitations     Patient Active Problem List   Diagnosis Date Noted  . Left-sided weakness 03/13/2016  . Cerebrovascular accident (CVA) ([place])   . Palpitations   . COPD (chronic obstructive pulmonary disease) ([place]) 08/20/2014  . Dyspnea 12/19/2013  . Chronic diastolic CHF (congestive heart failure) ([place]) 07/05/2013  . Lower extremity edema 07/05/2013  . Hyperlipidemia   . Chest pain 07/04/2013  . Hypokalemia 09/04/2012  . Precordial pain 09/04/2012  . Viral meningitis 01/22/2011  . Vomiting 01/22/2011  . PNEUMONIA, LEFT LOWER LOBE 10/10/2006  . DISEASE, ACUTE BRONCHOSPASM 10/10/2006  . Essential hypertension 05/23/2006  . OSTEOARTHRITIS 05/23/2006    Past Surgical History:  Procedure Laterality Date  . ABDOMINAL HYSTERECTOMY    . LEFT HEART  CATHETERIZATION WITH CORONARY ANGIOGRAM N/A 09/05/2012   Procedure: LEFT HEART CATHETERIZATION WITH CORONARY ANGIOGRAM;  Surgeon: Wellington Hampshire, MD;  Location: [facility] CATH LAB;  Service: Cardiovascular;  Laterality: N/A;  . THYROID SURGERY  2002     OB History    Gravida      Para      Term      Preterm      AB      Living  0     SAB      TAB      Ectopic      Multiple      Live Births               Home Medications    Prior to Admission medications   Medication Sig Start Date End Date Taking? Authorizing Provider  acetaminophen (TYLENOL) 500 MG tablet Take 1,000 mg by mouth every 6 (six) hours as needed for headache (pain).    Yes [provider]  albuterol (PROVENTIL) (2.5 MG/3ML) 0.083% nebulizer solution Take 2.5 mg by nebulization every 6 (six) hours as needed for wheezing or shortness of breath.   Yes [provider]  Albuterol Sulfate (PROAIR RESPICLICK) 703 (90 Base) MCG/ACT AEPB Inhale 2 puffs into the lungs every 4 (four) hours as needed (shortness of breath/ wheezing).   Yes [provider]  amLODipine (NORVASC) 10 MG tablet Take 1 tablet (10 mg total) by mouth daily.  06/17/15  Yes BranchAlphonse Guild, MD  aspirin 325 MG tablet Take 1 tablet (325 mg total) by mouth daily. 03/16/16  Yes Lavina Hamman, MD  atorvastatin (LIPITOR) 20 MG tablet Take 20 mg by mouth daily.    Yes [provider]  chlorthalidone (HYGROTON) 25 MG tablet Take 1 tablet (25 mg total) by mouth daily. 10/06/15  Yes BranchAlphonse Guild, MD  cycloSPORINE (RESTASIS) 0.05 % ophthalmic emulsion Place 1 drop into both eyes 2 (two) times daily.    Yes [provider]  famotidine (PEPCID) 20 MG tablet Take 1 tablet (20 mg total) by mouth daily. 03/16/16  Yes Lavina Hamman, MD  furosemide (LASIX) 20 MG tablet Take 1 tablet daily as needed may take 2 tablets prn 11/10/16  Yes Branch, Alphonse Guild, MD  ibuprofen (ADVIL,MOTRIN) 800 MG tablet Take 1 tablet by  mouth 2 (two) times daily as needed (back pain).  08/01/14  Yes [provider]  methocarbamol (ROBAXIN) 500 MG tablet Take 2 tablets (1,000 mg total) by mouth 4 (four) times daily as needed for muscle spasms (muscle spasm/pain). 11/03/16  Yes Francine Graven, DO  metoprolol tartrate (LOPRESSOR) 50 MG tablet Take 0.5 tablets (25 mg total) by mouth 2 (two) times daily. 10/07/17  Yes Noemi Chapel, MD  nitroGLYCERIN (NITROSTAT) 0.4 MG SL tablet Place 1 tablet (0.4 mg total) under the tongue every 5 (five) minutes as needed for chest pain. 09/09/15  Yes Imogene Burn, PA-C  potassium chloride (K-DUR) 10 MEQ tablet Starting 05/21/16, take 20 meq ( 2 tablets) twice a day for 4 days, THEN, go back to 10 meq ( 1 tablet ) twice a day 05/20/16  Yes Branch, Alphonse Guild, MD  spironolactone (ALDACTONE) 25 MG tablet Take 1 tablet (25 mg total) by mouth 2 (two) times daily. 10/19/15  Yes Lendon Colonel, NP  umeclidinium-vilanterol (ANORO ELLIPTA) 62.5-25 MCG/INH AEPB Inhale 1 puff into the lungs daily as needed (shortness of breath/ wheezing).   Yes [provider]  isosorbide mononitrate (IMDUR) 30 MG 24 hr tablet TAKE 1 TABLET (30 MG TOTAL) BY MOUTH DAILY. Patient not taking: Reported on 10/17/2017 04/19/17   Satira Sark, MD    Family History Family History  Problem Relation Age of Onset  . Heart attack Mother 64  . Heart attack Sister 74    Social History Social History   Tobacco Use  . Smoking status: Never Smoker  . Smokeless tobacco: Never Used  Substance Use Topics  . Alcohol use: No    Alcohol/week: 0.0 oz  . Drug use: No     Allergies   Other   Review of Systems Review of Systems ROS: Statement: All systems negative except as marked or noted in the HPI; Constitutional: Negative for fever and chills. ; ; Eyes: Negative for eye pain, redness and discharge. ; ; ENMT: Negative for ear pain, hoarseness, nasal congestion, sinus pressure and sore throat. ; ;  Cardiovascular: Negative for chest pain, palpitations, diaphoresis, dyspnea and peripheral edema. ; ; Respiratory: Negative for cough, wheezing and stridor. ; ; Gastrointestinal: Negative for nausea, vomiting, diarrhea, abdominal pain, blood in stool, hematemesis, jaundice and rectal bleeding. . ; ; Genitourinary: Negative for dysuria, flank pain and hematuria. ; ; Musculoskeletal: +left arm pain, left neck pain. Negative for back pain. Negative for swelling and trauma.; ; Skin: Negative for pruritus, rash, abrasions, blisters, bruising and skin lesion.; ; Neuro: Negative for headache, lightheadedness and neck stiffness. Negative for weakness, altered  level of consciousness, altered mental status, extremity weakness, paresthesias, involuntary movement, seizure and syncope.       Physical Exam Updated Vital Signs BP (!) 187/95 (BP Location: Right Arm)   Pulse 73   Temp 98.2 F (36.8 C) (Oral)   Resp 19   SpO2 97%   Physical Exam 1905: Physical examination:  Nursing notes reviewed; Vital signs and O2 SAT reviewed;  Constitutional: Well developed, Well nourished, Well hydrated, In no acute distress; Head:  Normocephalic, atraumatic; Eyes: EOMI, PERRL, No scleral icterus; ENMT: Mouth and pharynx normal, Mucous membranes moist; Neck: Supple, Full range of motion, No lymphadenopathy; Cardiovascular: Regular rate and rhythm, No gallop; Respiratory: Breath sounds clear & equal bilaterally, No wheezes.  Speaking full sentences with ease, Normal respiratory effort/excursion; Chest: Nontender, Movement normal; Abdomen: Soft, Nontender, Nondistended, Normal bowel sounds; Genitourinary: No CVA tenderness; Spine:  No midline CS, TS, LS tenderness. +TTP left hypertonic trapezius muscle which reproduces pt's symptoms. No rash.;; Extremities: Peripheral pulses normal, No deformity. No tenderness left shoulder/elbow/wrist/hand. No LUE erythema, edema, ecchymosis or open wounds. Muscles compartments soft. NT to palp  left shoulder, AC joint, clavicle NT, scapula NT, proximal humerus NT, biceps tendon NT over bicipital groove.  Motor strength at shoulder normal.  Sensation intact over deltoid region, distal NMS intact with left hand having intact and equal sensation and strength in the distribution of the median, radial, and ulnar nerve function compared to opposite side.  Strong radial pulse.  +FROM left elbow with intact motor strength biceps and triceps muscles to resistance.  No calf edema or asymmetry.; Neuro: AA&Ox3, Major CN grossly intact.  Speech clear. No gross focal motor or sensory deficits in extremities.; Skin: Color normal, Warm, Dry.   ED Treatments / Results  Labs (all labs ordered are listed, but only abnormal results are displayed)   EKG EKG Interpretation  Date/Time:  Tuesday October 17 2017 20:41:39 EDT Ventricular Rate:  65 PR Interval:    QRS Duration: 74 QT Interval:  418 QTC Calculation: 435 R Axis:   46 Text Interpretation:  Sinus rhythm Nonspecific T abnormalities, diffuse leads Baseline wander When compared with ECG of 05/13/2016 No significant change was found Confirmed by Francine Graven [phone]) on 10/17/2017 8:46:53 PM   Radiology   Procedures Procedures (including critical care time)  Medications Ordered in ED Medications  methocarbamol (ROBAXIN) tablet 750 mg (750 mg Oral Given 10/17/17 1917)  ibuprofen (ADVIL,MOTRIN) tablet 400 mg (400 mg Oral Given 10/17/17 1917)  acetaminophen (TYLENOL) tablet 650 mg (650 mg Oral Given 10/17/17 1917)     Initial Impression / Assessment and Plan / ED Course  I have reviewed the triage vital signs and the nursing notes.  Pertinent labs & imaging results that were available during my care of the patient were reviewed by me and considered in my medical decision making (see chart for details).  MDM Reviewed: previous chart, nursing note and vitals Reviewed previous: labs, ECG and x-ray Interpretation: labs, ECG and CT  scan   Results for orders placed or performed during the hospital encounter of 92/11/94  Basic metabolic panel  Result Value Ref Range   Sodium 140 135 - 145 mmol/L   Potassium 3.9 3.5 - 5.1 mmol/L   Chloride 107 98 - 111 mmol/L   CO2 28 22 - 32 mmol/L   Glucose, Bld 105 (H) 70 - 99 mg/dL   BUN 16 8 - 23 mg/dL   Creatinine, Ser 0.73 0.44 - 1.00 mg/dL   Calcium 8.9  8.9 - 10.3 mg/dL   GFR calc non Af Amer >60 >60 mL/min   GFR calc Af Amer >60 >60 mL/min   Anion gap 5 5 - 15  CBC with Differential  Result Value Ref Range   WBC 4.3 4.0 - 10.5 K/uL   RBC 3.97 3.87 - 5.11 MIL/uL   Hemoglobin 11.6 (L) 12.0 - 15.0 g/dL   HCT 35.8 (L) 36.0 - 46.0 %   MCV 90.2 78.0 - 100.0 fL   MCH 29.2 26.0 - 34.0 pg   MCHC 32.4 30.0 - 36.0 g/dL   RDW 14.6 11.5 - 15.5 %   Platelets 254 150 - 400 K/uL   Neutrophils Relative % 41 %   Neutro Abs 1.8 1.7 - 7.7 K/uL   Lymphocytes Relative 48 %   Lymphs Abs 2.1 0.7 - 4.0 K/uL   Monocytes Relative 7 %   Monocytes Absolute 0.3 0.1 - 1.0 K/uL   Eosinophils Relative 4 %   Eosinophils Absolute 0.2 0.0 - 0.7 K/uL   Basophils Relative 0 %   Basophils Absolute 0.0 0.0 - 0.1 K/uL  Troponin I  Result Value Ref Range   Troponin I <0.03 <0.03 ng/mL   Dg Chest 2 View Result Date: 10/07/2017 CLINICAL DATA:  Palpitation EXAM: CHEST - 2 VIEW COMPARISON:  07/01/2017 FINDINGS: The heart size and mediastinal contours are within normal limits. Both lungs are clear. The visualized skeletal structures are unremarkable. Aortic atherosclerosis. IMPRESSION: No active cardiopulmonary disease. Electronically Signed   By: Donavan Foil M.D.   On: 10/07/2017 20:18   Dg Humerus Left Result Date: 10/07/2017 CLINICAL DATA:  Pain in the upper arm EXAM: LEFT HUMERUS - 2+ VIEW COMPARISON:  None. FINDINGS: No fracture or malalignment. Mild AC joint degenerative change. Soft tissues are unremarkable. IMPRESSION: No acute osseous abnormality. Electronically Signed   By: Donavan Foil M.D.    On: 10/07/2017 20:19   Ct Cervical Spine Wo Contrast Result Date: 10/17/2017 CLINICAL DATA:  Initial evaluation for neck pain with left arm pain and numbness. EXAM: CT CERVICAL SPINE WITHOUT CONTRAST TECHNIQUE: Multidetector CT imaging of the cervical spine was performed without intravenous contrast. Multiplanar CT image reconstructions were also generated. COMPARISON:  Prior MRI from 03/14/2016. FINDINGS: Alignment: Straightening of the normal cervical lordosis. No listhesis. Skull base and vertebrae: Skull base intact. Normal C1-2 articulations are preserved. Dens is intact. Vertebral body heights maintained. No evidence for acute fracture. Soft tissues and spinal canal: Soft tissues of the neck demonstrate no acute finding. Patient status post left thyroidectomy. Right thyroid lobe somewhat enlarged and heterogeneous in appearance. Disc levels: C2-3: Unremarkable. C3-4: Mild uncovertebral hypertrophy. Mild right-sided facet hypertrophy. No significant stenosis. C4-5: Mild disc bulge with intervertebral disc space narrowing. Mild uncovertebral and facet hypertrophy. No significant stenosis. C5-6: Mild disc bulge with uncovertebral hypertrophy. Moderate left with mild right facet hypertrophy. No significant spinal stenosis. Moderate left C6 foraminal narrowing. C6-7: Mild diffuse disc bulge with bilateral uncovertebral and facet hypertrophy. No significant spinal stenosis. No appreciable bony foraminal narrowing. C7-T1: Mild facet hypertrophy.  No stenosis. Upper chest: Visualized upper chest demonstrates no significant finding. Visualized lung apices are clear. Other: None. IMPRESSION: 1. No acute abnormality within the cervical spine. 2. Degenerative disc osteophyte at C5-6 with resultant moderate left C6 foraminal stenosis, similar to previous. 3. Additional mild cervical spondylolysis and facet hypertrophy at C3-4 through C7-T1 without significant canal or foraminal stenosis. Overall, appearance is similar  to previous. Electronically Signed   By: Marland Kitchen  Jeannine Boga M.D.   On: 10/17/2017 20:07    2055:  CT without acute surgical issue. Workup otherwise reassuring. Doubt PE as cause for symptoms with low risk [place].  Doubt ACS as cause for symptoms with normal troponin and unchanged EKG from previous after 2 weeks of constant/atypical symptoms.   Tx symptomatically at this time. Dx and testing d/w pt and family.  Questions answered.  Verb understanding, agreeable to d/c home with outpt f/u.    Final Clinical Impressions(s) / ED Diagnoses   Final diagnoses:  None    ED Discharge Orders    None       Francine Graven, DO 10/22/17 [id number]

## 2017-10-27 ENCOUNTER — Encounter: Payer: Self-pay | Admitting: Physician Assistant

## 2017-10-27 NOTE — Progress Notes (Deleted)
Cardiology Office Note    Date:  10/27/2017  ID:  KONICA STANKOWSKI, DOB [birth year], MRN [id number] PCP:  Iona Beard, MD  Cardiologist:  Carlyle Dolly, MD  Chief Complaint: f/u palpitations  History of Present Illness:  Hannah Cooper is a 62 y.o. female with history of palpitations (PACs, PVCs), long history of atypical chest pain without cardiac cause, obesity, COPD, HTN, chronic diastolic CHF, arthritis, HLD, lumbar radiculopathy who presents for overdue follow-up. Cardiac cath 08/2012 showed normal coronaries with moderately elevated LVEDP c/w diastolic CHF. Last echo 02/2016 showed EF 65-70%, grade 1 DD. CPX 06/2014 demonstrated mild-moderately reduced functional capacity when compared to matched sedentary norms. Event monitoring in 05/2016 showed NSR with occasional PACs, PVCs. Last labs 10/2017 showed Hgb 11.6 , K 3.9, glucose 105, Cr 0.73, normal troponin, 09/2017 TSH wnl. Seen in ED 10/07/17 with palpitations (PACs on monitor) and 10/2017 with left arm pain without acute surgical or cardiac finding.  osa? pcp f lipids ? hx stroke  Chronic chest pain PACs PVCs Chronic diastolic CHF Essential HTN   Past Medical History:  Diagnosis Date  . Arthritis   . Atypical chest pain    chronic  . Chronic back pain   . Chronic diastolic CHF (congestive heart failure) ([place])   . COPD (chronic obstructive pulmonary disease) ([place])   . DDD (degenerative disc disease), cervical   . Hyperlipidemia   . Hypertension   . Lumbar radiculopathy   . Meningitis   . Normal coronary arteries 2014  . Palpitations   . Premature atrial contractions   . PVC's (premature ventricular contractions)     Past Surgical History:  Procedure Laterality Date  . ABDOMINAL HYSTERECTOMY    . LEFT HEART CATHETERIZATION WITH CORONARY ANGIOGRAM N/A 09/05/2012   Procedure: LEFT HEART CATHETERIZATION WITH CORONARY ANGIOGRAM;  Surgeon: Wellington Hampshire, MD;  Location: [facility] CATH LAB;  Service: Cardiovascular;   Laterality: N/A;  . THYROID SURGERY  2002    Current Medications: No outpatient medications have been marked as taking for the 10/31/17 encounter (Appointment) with Charlie Pitter, PA-C.   ***   Allergies:   Other   Social History   Socioeconomic History  . Marital status: Married    Spouse name: Not on file  . Number of children: Not on file  . Years of education: Not on file  . Highest education level: Not on file  Occupational History  . Not on file  Social Needs  . Financial resource strain: Not on file  . Food insecurity:    Worry: Not on file    Inability: Not on file  . Transportation needs:    Medical: Not on file    Non-medical: Not on file  Tobacco Use  . Smoking status: Never Smoker  . Smokeless tobacco: Never Used  Substance and Sexual Activity  . Alcohol use: No    Alcohol/week: 0.0 oz  . Drug use: No  . Sexual activity: Yes    Partners: Male  Lifestyle  . Physical activity:    Days per week: Not on file    Minutes per session: Not on file  . Stress: Not on file  Relationships  . Social connections:    Talks on phone: Not on file    Gets together: Not on file    Attends religious service: Not on file    Active member of club or organization: Not on file    Attends meetings of clubs or organizations: Not on file  Relationship status: Not on file  Other Topics Concern  . Not on file  Social History Narrative  . Not on file     Family History:  The patient's ***family history includes Heart attack (age of onset: 53) in her sister; Heart attack (age of onset: 53) in her mother.  ROS:   Please see the history of present illness. Otherwise, review of systems is positive for ***.  All other systems are reviewed and otherwise negative.    PHYSICAL EXAM:   VS:  There were no vitals taken for this visit.  BMI: There is no height or weight on file to calculate BMI. GEN: Well nourished, well developed, in no acute distress HEENT: normocephalic,  atraumatic Neck: no JVD, carotid bruits, or masses Cardiac: ***RRR; no murmurs, rubs, or gallops, no edema  Respiratory:  clear to auscultation bilaterally, normal work of breathing GI: soft, nontender, nondistended, + BS MS: no deformity or atrophy Skin: warm and dry, no rash Neuro:  Alert and Oriented x 3, Strength and sensation are intact, follows commands Psych: euthymic mood, full affect  Wt Readings from Last 3 Encounters:  10/07/17 220 lb (99.8 kg)  07/01/17 225 lb (102.1 kg)  02/15/17 215 lb (97.5 kg)      Studies/Labs Reviewed:   EKG:  EKG was ordered today and personally reviewed by me and demonstrates *** EKG was not ordered today.***  Recent Labs: 11/03/2016: ALT 25 10/07/2017: TSH 4.268 10/17/2017: BUN 16; Creatinine, Ser 0.73; Hemoglobin 11.6; Platelets 254; Potassium 3.9; Sodium 140   Lipid Panel    Component Value Date/Time   CHOL 123 03/14/2016 0739   TRIG 85 03/14/2016 0739   HDL 46 03/14/2016 0739   CHOLHDL 2.7 03/14/2016 0739   VLDL 17 03/14/2016 0739   LDLCALC 60 03/14/2016 0739    Additional studies/ records that were reviewed today include: Summarized above.***    ASSESSMENT & PLAN:   1. ***  Disposition: F/u with ***   Medication Adjustments/Labs and Tests Ordered: Current medicines are reviewed at length with the patient today.  Concerns regarding medicines are outlined above. Medication changes, Labs and Tests ordered today are summarized above and listed in the Patient Instructions accessible in Encounters.   Signed, Charlie Pitter, PA-C  10/27/2017 12:31 PM    [facility] [place] [address], [place] [address] Ph: [phone]; Fax [phone]

## 2017-10-31 ENCOUNTER — Ambulatory Visit: Payer: No Typology Code available for payment source | Admitting: Physician Assistant

## 2017-11-01 ENCOUNTER — Encounter: Payer: Self-pay | Admitting: Physician Assistant

## 2018-02-27 ENCOUNTER — Telehealth: Payer: Self-pay

## 2018-02-27 NOTE — Telephone Encounter (Signed)
Numerous attempts to contact patient with recall letters. Unable to reach by telephone. with no success.   Delfino Lovett T [[id number]] 11/10/2016 10:52 AM New [10]    [System] 01/16/2017 11:04 PM Notification Sent [20]   Orinda Kenner [[id number]] 10/09/2017 11:11 AM Scheduled/Linked [30]   [System] 11/04/2017 12:38 AM Notification Sent [20]   Chanda Busing [[id number]] 02/27/2018 2:00 PM Notification Sent [20]

## 2018-10-24 ENCOUNTER — Other Ambulatory Visit: Payer: Self-pay

## 2018-10-24 DIAGNOSIS — Z20822 Contact with and (suspected) exposure to covid-19: Secondary | ICD-10-CM

## 2018-10-29 LAB — NOVEL CORONAVIRUS, NAA: SARS-CoV-2, NAA: NOT DETECTED

## 2018-10-31 ENCOUNTER — Telehealth: Payer: Self-pay | Admitting: Family Medicine

## 2018-10-31 NOTE — Telephone Encounter (Signed)
Pt received negative covid test results.

## 2019-01-03 ENCOUNTER — Other Ambulatory Visit (HOSPITAL_COMMUNITY): Payer: Self-pay | Admitting: Family Medicine

## 2019-01-03 DIAGNOSIS — Z1231 Encounter for screening mammogram for malignant neoplasm of breast: Secondary | ICD-10-CM

## 2019-03-19 ENCOUNTER — Other Ambulatory Visit: Payer: Self-pay

## 2019-03-19 DIAGNOSIS — Z20822 Contact with and (suspected) exposure to covid-19: Secondary | ICD-10-CM

## 2019-03-21 ENCOUNTER — Telehealth: Payer: Self-pay | Admitting: *Deleted

## 2019-03-21 LAB — NOVEL CORONAVIRUS, NAA: SARS-CoV-2, NAA: NOT DETECTED

## 2019-03-21 NOTE — Telephone Encounter (Signed)
Patient calling for COVID test result- notified negative 

## 2019-04-24 ENCOUNTER — Other Ambulatory Visit: Payer: Self-pay | Admitting: Family Medicine

## 2019-04-24 ENCOUNTER — Other Ambulatory Visit (HOSPITAL_COMMUNITY): Payer: Self-pay | Admitting: Family Medicine

## 2019-04-24 DIAGNOSIS — M5416 Radiculopathy, lumbar region: Secondary | ICD-10-CM

## 2019-05-03 ENCOUNTER — Ambulatory Visit (HOSPITAL_COMMUNITY): Payer: Self-pay

## 2019-05-23 ENCOUNTER — Ambulatory Visit (HOSPITAL_COMMUNITY)
Admission: RE | Admit: 2019-05-23 | Discharge: 2019-05-23 | Disposition: A | Discharge status: Home / Self Care | Payer: PRIVATE HEALTH INSURANCE | Source: Ambulatory Visit | Attending: Family Medicine | Admitting: Family Medicine

## 2019-05-23 ENCOUNTER — Other Ambulatory Visit: Payer: Self-pay

## 2019-05-23 DIAGNOSIS — M5416 Radiculopathy, lumbar region: Secondary | ICD-10-CM | POA: Diagnosis not present

## 2019-05-28 ENCOUNTER — Encounter (HOSPITAL_COMMUNITY): Payer: Self-pay | Admitting: Hematology

## 2019-05-28 ENCOUNTER — Other Ambulatory Visit: Payer: Self-pay

## 2019-05-28 ENCOUNTER — Inpatient Hospital Stay (HOSPITAL_COMMUNITY): Payer: PRIVATE HEALTH INSURANCE | Attending: Hematology | Admitting: Hematology

## 2019-05-28 ENCOUNTER — Inpatient Hospital Stay (HOSPITAL_COMMUNITY): Payer: PRIVATE HEALTH INSURANCE

## 2019-05-28 DIAGNOSIS — D472 Monoclonal gammopathy: Secondary | ICD-10-CM | POA: Insufficient documentation

## 2019-05-28 DIAGNOSIS — Z803 Family history of malignant neoplasm of breast: Secondary | ICD-10-CM | POA: Diagnosis not present

## 2019-05-28 DIAGNOSIS — J449 Chronic obstructive pulmonary disease, unspecified: Secondary | ICD-10-CM | POA: Insufficient documentation

## 2019-05-28 DIAGNOSIS — Z791 Long term (current) use of non-steroidal anti-inflammatories (NSAID): Secondary | ICD-10-CM | POA: Insufficient documentation

## 2019-05-28 DIAGNOSIS — E785 Hyperlipidemia, unspecified: Secondary | ICD-10-CM | POA: Insufficient documentation

## 2019-05-28 DIAGNOSIS — Z79899 Other long term (current) drug therapy: Secondary | ICD-10-CM | POA: Insufficient documentation

## 2019-05-28 DIAGNOSIS — M545 Low back pain: Secondary | ICD-10-CM | POA: Insufficient documentation

## 2019-05-28 DIAGNOSIS — R937 Abnormal findings on diagnostic imaging of other parts of musculoskeletal system: Secondary | ICD-10-CM | POA: Insufficient documentation

## 2019-05-28 DIAGNOSIS — Z90710 Acquired absence of both cervix and uterus: Secondary | ICD-10-CM | POA: Insufficient documentation

## 2019-05-28 DIAGNOSIS — R519 Headache, unspecified: Secondary | ICD-10-CM | POA: Insufficient documentation

## 2019-05-28 DIAGNOSIS — Z7982 Long term (current) use of aspirin: Secondary | ICD-10-CM | POA: Diagnosis not present

## 2019-05-28 DIAGNOSIS — M899 Disorder of bone, unspecified: Secondary | ICD-10-CM | POA: Insufficient documentation

## 2019-05-28 DIAGNOSIS — R262 Difficulty in walking, not elsewhere classified: Secondary | ICD-10-CM | POA: Insufficient documentation

## 2019-05-28 DIAGNOSIS — I110 Hypertensive heart disease with heart failure: Secondary | ICD-10-CM | POA: Diagnosis not present

## 2019-05-28 DIAGNOSIS — I5032 Chronic diastolic (congestive) heart failure: Secondary | ICD-10-CM | POA: Insufficient documentation

## 2019-05-28 LAB — CBC WITH DIFFERENTIAL/PLATELET
Abs Immature Granulocytes: 0.01 10*3/uL (ref 0.00–0.07)
Basophils Absolute: 0 10*3/uL (ref 0.0–0.1)
Basophils Relative: 1 %
Eosinophils Absolute: 0.1 10*3/uL (ref 0.0–0.5)
Eosinophils Relative: 3 %
HCT: 40.7 % (ref 36.0–46.0)
Hemoglobin: 13.3 g/dL (ref 12.0–15.0)
Immature Granulocytes: 0 %
Lymphocytes Relative: 44 %
Lymphs Abs: 1.7 10*3/uL (ref 0.7–4.0)
MCH: 29.6 pg (ref 26.0–34.0)
MCHC: 32.7 g/dL (ref 30.0–36.0)
MCV: 90.4 fL (ref 80.0–100.0)
Monocytes Absolute: 0.4 10*3/uL (ref 0.1–1.0)
Monocytes Relative: 12 %
Neutro Abs: 1.5 10*3/uL — ABNORMAL LOW (ref 1.7–7.7)
Neutrophils Relative %: 40 %
Platelets: 273 10*3/uL (ref 150–400)
RBC: 4.5 MIL/uL (ref 3.87–5.11)
RDW: 14 % (ref 11.5–15.5)
WBC: 3.7 10*3/uL — ABNORMAL LOW (ref 4.0–10.5)
nRBC: 0 % (ref 0.0–0.2)

## 2019-05-28 LAB — COMPREHENSIVE METABOLIC PANEL
ALT: 49 U/L — ABNORMAL HIGH (ref 0–44)
AST: 33 U/L (ref 15–41)
Albumin: 4.7 g/dL (ref 3.5–5.0)
Alkaline Phosphatase: 74 U/L (ref 38–126)
Anion gap: 8 (ref 5–15)
BUN: 12 mg/dL (ref 8–23)
CO2: 27 mmol/L (ref 22–32)
Calcium: 9.3 mg/dL (ref 8.9–10.3)
Chloride: 105 mmol/L (ref 98–111)
Creatinine, Ser: 0.77 mg/dL (ref 0.44–1.00)
GFR calc Af Amer: >60 mL/min (ref >60)
GFR calc non Af Amer: >60 mL/min (ref >60)
Glucose, Bld: 93 mg/dL (ref 70–99)
Potassium: 3.8 mmol/L (ref 3.5–5.1)
Sodium: 140 mmol/L (ref 135–145)
Total Bilirubin: 0.6 mg/dL (ref 0.3–1.2)
Total Protein: 8.5 g/dL — ABNORMAL HIGH (ref 6.5–8.1)

## 2019-05-28 LAB — LACTATE DEHYDROGENASE: LDH: 193 U/L — ABNORMAL HIGH (ref 98–192)

## 2019-05-28 NOTE — Progress Notes (Signed)
[facility] NOTE  Patient Care Team: Scherrie Bateman as PCP - General (Family Medicine) Harl Bowie Alphonse Guild, MD as PCP - Cardiology (Cardiology) Lendon Colonel, NP as Nurse Practitioner (Nurse Practitioner)  CHIEF COMPLAINTS/PURPOSE OF CONSULTATION:  L4 vertebral body lesion.  HISTORY OF PRESENTING ILLNESS:  Hannah Cooper 64 y.o. female is seen in consultation today at the request of Delman Cheadle, PA-C for further work-up and management of L4 vertebral body lesion.  Patient reported having low back pain for the past couple of years.  However in the last 6 months to 1 year, she developed worsening pain.  She had fell few times as left leg gives out.  She reported pain across the lower back which occasionally goes down her left leg.  She does report difficulty walking.  She also has pain along the left lateral ribs going up to the level of the scapula.  Denies any fevers, night sweats or weight loss.  Denies any infections.  No loss of bowel or bladder function.  No prior history of connective tissue disorders.  No personal history of malignancies.  Last mammogram was on 08/04/2015, BI-RADS Category 1.  Never had a colonoscopy.  Denies any bleeding per rectum or melena.  She is accompanied by her husband today.  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  No chemical exposure.  Family history is significant for breast cancer in multiple paternal cousins.  Reports appetite of 75% and energy levels of 50%.  Pain in the low back is reported as 8 out of 10.  Shortness of breath on exertion is stable.  She reported headaches which are recent onset as she is stressed out about what is going on.  MEDICAL HISTORY:  Past Medical History:  Diagnosis Date  . Arthritis   . Atypical chest pain    chronic  . Chronic back pain   . Chronic diastolic CHF (congestive heart failure) ([place])   . COPD (chronic  obstructive pulmonary disease) ([place])   . DDD (degenerative disc disease), cervical   . Hyperlipidemia   . Hypertension   . Lumbar radiculopathy   . Meningitis   . Normal coronary arteries 2014  . Palpitations   . Premature atrial contractions   . PVC's (premature ventricular contractions)     SURGICAL HISTORY: Past Surgical History:  Procedure Laterality Date  . ABDOMINAL HYSTERECTOMY    . LEFT HEART CATHETERIZATION WITH CORONARY ANGIOGRAM N/A 09/05/2012   Procedure: LEFT HEART CATHETERIZATION WITH CORONARY ANGIOGRAM;  Surgeon: Wellington Hampshire, MD;  Location: [facility];  Service: Cardiovascular;  Laterality: N/A;  . THYROID SURGERY  2002    SOCIAL HISTORY: Social History   Socioeconomic History  . Marital status: Married    Spouse name: Not on file  . Number of children: Not on file  . Years of education: Not on file  . Highest education level: Not on file  Occupational History  . Not on file  Tobacco Use  . Smoking status: Never Smoker  . Smokeless tobacco: Never Used  Substance and Sexual Activity  . Alcohol use: No    Alcohol/week: 0.0 standard drinks  . Drug use: No  . Sexual activity: Yes    Partners: Male  Other Topics Concern  . Not on file  Social History Narrative  . Not on file   Social Determinants of Health   Financial Resource  Strain: Low Risk   . Difficulty of Paying Living Expenses: Not hard at all  Food Insecurity: No Food Insecurity  . Worried About Charity fundraiser in the Last Year: Never true  . Ran Out of Food in the Last Year: Never true  Transportation Needs: No Transportation Needs  . Lack of Transportation (Medical): No  . Lack of Transportation (Non-Medical): No  Physical Activity: Inactive  . Days of Exercise per Week: 0 days  . Minutes of Exercise per Session: 0 min  Stress: Stress Concern Present  . Feeling of Stress : To some extent  Social Connections: Not Isolated  . Frequency of Communication with Friends and Family:  More than three times a week  . Frequency of Social Gatherings with Friends and Family: Never  . Attends Religious Services: More than 4 times per year  . Active Member of Clubs or Organizations: Yes  . Attends Archivist Meetings: More than 4 times per year  . Marital Status: Married  Human resources officer Violence: Not At Risk  . Fear of Current or Ex-Partner: No  . Emotionally Abused: No  . Physically Abused: No  . Sexually Abused: No    FAMILY HISTORY: Family History  Problem Relation Age of Onset  . Heart attack Mother 44  . Heart attack Sister 57    ALLERGIES:  is allergic to other.  MEDICATIONS:  Current Outpatient Medications  Medication Sig Dispense Refill  . amLODipine (NORVASC) 10 MG tablet Take 1 tablet (10 mg total) by mouth daily. 90 tablet 3  . aspirin 325 MG tablet Take 1 tablet (325 mg total) by mouth daily. 30 tablet 0  . cycloSPORINE (RESTASIS) 0.05 % ophthalmic emulsion Place 1 drop into both eyes 2 (two) times daily.     . isosorbide mononitrate (IMDUR) 30 MG 24 hr tablet TAKE 1 TABLET (30 MG TOTAL) BY MOUTH DAILY. 90 tablet 0  . metoprolol tartrate (LOPRESSOR) 50 MG tablet Take 0.5 tablets (25 mg total) by mouth 2 (two) times daily. 60 tablet 1  . acetaminophen (TYLENOL) 500 MG tablet Take 1,000 mg by mouth every 6 (six) hours as needed for headache (pain).     Marland Kitchen albuterol (PROVENTIL) (2.5 MG/3ML) 0.083% nebulizer solution Take 2.5 mg by nebulization every 6 (six) hours as needed for wheezing or shortness of breath.    . Albuterol Sulfate (PROAIR RESPICLICK) 123XX123 (90 Base) MCG/ACT AEPB Inhale 2 puffs into the lungs every 4 (four) hours as needed (shortness of breath/ wheezing).    . furosemide (LASIX) 20 MG tablet Take 1 tablet daily as needed may take 2 tablets prn (Patient not taking: Reported on 05/28/2019) 60 tablet 1  . HYDROcodone-acetaminophen (NORCO/VICODIN) 5-325 MG tablet 1 or 2 tabs PO q8 hours prn pain (Patient not taking: Reported on 05/28/2019) 10  tablet 0  . ibuprofen (ADVIL,MOTRIN) 800 MG tablet Take 1 tablet by mouth 2 (two) times daily as needed (back pain).   2  . nitroGLYCERIN (NITROSTAT) 0.4 MG SL tablet Place 1 tablet (0.4 mg total) under the tongue every 5 (five) minutes as needed for chest pain. (Patient not taking: Reported on 05/28/2019) 25 tablet 6   No current facility-administered medications for this visit.    REVIEW OF SYSTEMS:   Constitutional: Denies fevers, chills or abnormal night sweats.  Positive for headaches. Eyes: Denies blurriness of vision, double vision or watery eyes Ears, nose, mouth, throat, and face: Denies mucositis or sore throat Respiratory: Denies any cough or wheezing.  Dyspnea on exertion present. Cardiovascular: Denies palpitation, chest discomfort or lower extremity swelling Gastrointestinal:  Denies nausea, heartburn or change in bowel habits Skin: Denies abnormal skin rashes Lymphatics: Denies new lymphadenopathy or easy bruising Neurological: Has some weakness of the left leg with occasional numbness. Behavioral/Psych: Mood is stable, no new changes  All other systems were reviewed with the patient and are negative.  PHYSICAL EXAMINATION: ECOG PERFORMANCE STATUS: 1 - Symptomatic but completely ambulatory  Vitals:   05/28/19 1355  BP: (!) 177/96  Pulse: 61  Resp: 19  Temp: (!) 97.1 F (36.2 C)  SpO2: 100%   Filed Weights   05/28/19 1355  Weight: 242 lb (109.8 kg)    GENERAL:alert, no distress and comfortable SKIN: skin color, texture, turgor are normal, no rashes or significant lesions EYES: normal, conjunctiva are pink and non-injected, sclera clear OROPHARYNX:no exudate, no erythema and lips, buccal mucosa, and tongue normal  NECK: supple, thyroid normal size, non-tender, without nodularity LYMPH:  no palpable lymphadenopathy in the cervical, axillary or inguinal LUNGS: clear to auscultation and percussion with normal breathing effort HEART: regular rate & rhythm and no  murmurs and no lower extremity edema ABDOMEN:abdomen soft, non-tender and normal bowel sounds Musculoskeletal: Decreased muscle strength in the left leg, particularly extension. PSYCH: alert & oriented x 3 with fluent speech NEURO: no focal motor/sensory deficits  LABORATORY DATA:  I have reviewed the data as listed Lab Results  Component Value Date   WBC 4.3 10/17/2017   HGB 11.6 (L) 10/17/2017   HCT 35.8 (L) 10/17/2017   MCV 90.2 10/17/2017   PLT 254 10/17/2017     Chemistry      Component Value Date/Time   NA 140 10/17/2017 1957   K 3.9 10/17/2017 1957   CL 107 10/17/2017 1957   CO2 28 10/17/2017 1957   BUN 16 10/17/2017 1957   CREATININE 0.73 10/17/2017 1957   CREATININE 0.85 07/12/2013 1558      Component Value Date/Time   CALCIUM 8.9 10/17/2017 1957   ALKPHOS 75 11/03/2016 1345   AST 23 11/03/2016 1345   ALT 25 11/03/2016 1345   BILITOT 0.7 11/03/2016 1345       RADIOGRAPHIC STUDIES: I have personally reviewed the radiological images as listed and agreed with the findings in the report. MR LUMBAR SPINE WO CONTRAST  Result Date: 05/23/2019 CLINICAL DATA:  Pain in lower back EXAM: MRI LUMBAR SPINE WITHOUT CONTRAST TECHNIQUE: Multiplanar, multisequence MR imaging of the lumbar spine was performed. No intravenous contrast was administered. COMPARISON:  Most recent prior June 09, 2016 FINDINGS: Segmentation: There are 5 non-rib bearing lumbar type vertebral bodies with the last intervertebral disc space labeled as L5-S1. Alignment:  Normal Vertebrae: There is a interval significant growth of a large T1 hypointense/T2 bright osseous lesion within the L4 vertebral body with significant surrounding marrow edema. The osseous lesion appears to have cortical breakthrough at the posterior L4 vertebral body causing mass effect upon the thecal sac. The thecal sac measures approximately 6 mm in AP dimension. There are areas of signal dropout seen within the posterior aspect of the  mass which could represent calcifications or blood products. The mass also appears to cortical break throughout the lateral left aspect of the vertebral body. Mild marrow edema seen extending into the bilateral pedicles. No other osseous lesions are seen. Conus medullaris and cauda equina: Conus extends to the L1 level. Conus and cauda equina appear normal. Paraspinal and other soft tissues: The paraspinal soft tissues and visualized  retroperitoneal structures are unremarkable. The sacroiliac joints are intact. Disc levels: T12-L1:  No significant canal or neural foraminal narrowing. L1-L2: There is a broad-based disc bulge with facet arthrosis which causes mild bilateral neural foraminal narrowing. L2-L3: There is a broad-based disc bulge with facet arthrosis which causes mild bilateral neural foraminal narrowing. L3-L4: There is a broad-based disc bulge with facet arthrosis which causes mild bilateral neural foraminal narrowing and mild central canal stenosis. L4-L5: As described above osseous lesion causing mild-to-moderate canal narrowing. At L4-L5 there is a broad-based disc bulge with facet arthrosis which causes moderate right and mild left neural foraminal narrowing there is mild central canal stenosis. L5-S1: There is a broad-based disc bulge and facet arthrosis which is asymmetric to the right which causes moderate bilateral neural foraminal narrowing, right greater than left. IMPRESSION: Interval progression in the large L4 vertebral body osseous lesion with aggressive features. Now showing cortical breakthrough and causing mild-to-moderate central canal narrowing. There is significant surrounding marrow edema which extends into the bilateral pedicles. Given the interval progression this likely represents in osseous primary neoplasm. Would recommend postcontrast images and surgical follow-up for further evaluation. These results will be called to the ordering clinician or representative by the Radiologist  Assistant, and communication documented in the PACS or zVision Dashboard. Electronically Signed   By: Prudencio Pair M.D.   On: 05/23/2019 22:22    ASSESSMENT & PLAN:  Lesion of bone of lumbosacral spine 1.  L4 vertebral body lesion: -She reported lower back pain for the last 2 years.  Pain has gotten worse in the last 6 months.  She reports pain across the lower back and pain extends to the left lateral ribs all the way up to the scapula.  Reports pain radiates down the left leg. -Also developed difficulty walking.  She has fell few times as left leg gives out. -MRI of the lumbar spine without contrast on 05/23/2019 showed interval progression of the large L4 vertebral body lesion showing cortical breakthrough and causing mild to moderate central canal narrowing.  There is significant surrounding bone marrow edema which extends into the bilateral pedicles.  Given the interval progression compared to prior MRI in 2018, likely she has primary neoplasm. -I have reviewed MRI images from 2018 which showed L4 lesion but much smaller. -Physical exam reveals decreased muscle strength in the left leg extension. -I have recommended MRI of the thoracic and lumbar spine with and without contrast. -We will also do work-up for plasma cell disorders including serum protein after pheresis and immunofixation.  We will check an LDH along with routine labs. -I have also recommended a PET CT scan.  2.  Health maintenance: -Last mammogram was on 08/04/2015 which was BI-RADS Category 1. -She never had a colonoscopy.  Denies any bleeding per rectum or melena.  3.  Lower back pain: -She is currently taking hydrocodone every 6 hours.  She does not want to take it as she feels drowsy when she takes it. -I have recommended taking Advil once or twice daily. -She was instructed to use her cane when walking.  Orders Placed This Encounter  Procedures  . NM PET Image Initial (PI) Skull Base To Thigh    Standing Status:    Future    Standing Expiration Date:   07/25/2020    Order Specific Question:   If indicated for the ordered procedure, I authorize the administration of a radiopharmaceutical per Radiology protocol    Answer:   Yes  Order Specific Question:   Preferred imaging location?    Answer:   [facility]    Order Specific Question:   Radiology Contrast Protocol - do NOT remove file path    Answer:   \\[email or web address]  . MR Lumbar Spine W Wo Contrast    Standing Status:   Future    Standing Expiration Date:   05/27/2020    Order Specific Question:   If indicated for the ordered procedure, I authorize the administration of contrast media per Radiology protocol    Answer:   Yes    Order Specific Question:   What is the patient's sedation requirement?    Answer:   No Sedation    Order Specific Question:   Does the patient have a pacemaker or implanted devices?    Answer:   No    Order Specific Question:   Use SRS Protocol?    Answer:   No    Order Specific Question:   Radiology Contrast Protocol - do NOT remove file path    Answer:   \\[email or web address]    Order Specific Question:   Preferred imaging location?    Answer:   [facility] (table limit-350 lbs)  . MR Thoracic Spine W Wo Contrast    Standing Status:   Future    Standing Expiration Date:   05/27/2020    Order Specific Question:   GRA to provide read?    Answer:   Yes    Order Specific Question:   If indicated for the ordered procedure, I authorize the administration of contrast media per Radiology protocol    Answer:   Yes    Order Specific Question:   What is the patient's sedation requirement?    Answer:   No Sedation    Order Specific Question:   Use SRS Protocol?    Answer:   No    Order Specific Question:   Does the patient have a pacemaker or implanted devices?    Answer:   No    Order Specific Question:   Preferred imaging location?    Answer:   [facility]  (table limit-350 lbs)    Order Specific Question:   Radiology Contrast Protocol - do NOT remove file path    Answer:   \\[email or web address]  . Immunofixation electrophoresis    Standing Status:   Future    Standing Expiration Date:   05/27/2020  . Protein electrophoresis, serum    Standing Status:   Future    Standing Expiration Date:   05/27/2020  . Lactate dehydrogenase    Standing Status:   Future    Standing Expiration Date:   05/27/2020  . CBC with Differential/Platelet    Standing Status:   Future    Standing Expiration Date:   05/27/2020  . Comprehensive metabolic panel    Standing Status:   Future    Standing Expiration Date:   05/27/2020    All questions were answered. The patient knows to call the clinic with any problems, questions or concerns.      Derek Jack, MD 05/28/2019 2:39 PM

## 2019-05-28 NOTE — Patient Instructions (Signed)
[place] at [facility] Discharge Instructions  You were seen today by Dr. Delton Coombes. He went over your history, family history and how you've been feeling lately. He will have blood drawn today before you leave. He will schedule you for a PET scan, and MRIs of your spine. He will see you back after your scans for follow up.   Thank you for choosing [place] at [facility] to provide your oncology and hematology care.  To afford each patient quality time with our provider, please arrive at least 15 minutes before your scheduled appointment time.   If you have a lab appointment with the [facility] please come in thru the  Main Entrance and check in at the main information desk  You need to re-schedule your appointment should you arrive 10 or more minutes late.  We strive to give you quality time with our providers, and arriving late affects you and other patients whose appointments are after yours.  Also, if you no show three or more times for appointments you may be dismissed from the clinic at the providers discretion.     Again, thank you for choosing [facility].  Our hope is that these requests will decrease the amount of time that you wait before being seen by our physicians.       _____________________________________________________________  Should you have questions after your visit to [facility], please contact our office at [phone] between the hours of 8:00 a.m. and 4:30 p.m.  Voicemails left after 4:00 p.m. will not be returned until the following business day.  For prescription refill requests, have your pharmacy contact our office and allow 72 hours.    Cancer Center Support Programs:   > Cancer Support Group  2nd Tuesday of the month 1pm-2pm, Journey Room

## 2019-05-28 NOTE — Assessment & Plan Note (Addendum)
1.  L4 vertebral body lesion: -She reported lower back pain for the last 2 years.  Pain has gotten worse in the last 6 months.  She reports pain across the lower back and pain extends to the left lateral ribs all the way up to the scapula.  Reports pain radiates down the left leg. -Also developed difficulty walking.  She has fell few times as left leg gives out. -MRI of the lumbar spine without contrast on 05/23/2019 showed interval progression of the large L4 vertebral body lesion showing cortical breakthrough and causing mild to moderate central canal narrowing.  There is significant surrounding bone marrow edema which extends into the bilateral pedicles.  Given the interval progression compared to prior MRI in 2018, likely she has primary neoplasm. -I have reviewed MRI images from 2018 which showed L4 lesion but much smaller. -Physical exam reveals decreased muscle strength in the left leg extension. -I have recommended MRI of the thoracic and lumbar spine with and without contrast. -We will also do work-up for plasma cell disorders including serum protein after pheresis and immunofixation.  We will check an LDH along with routine labs. -I have also recommended a PET CT scan.  2.  Health maintenance: -Last mammogram was on 08/04/2015 which was BI-RADS Category 1. -She never had a colonoscopy.  Denies any bleeding per rectum or melena.  3.  Lower back pain: -She is currently taking hydrocodone every 6 hours.  She does not want to take it as she feels drowsy when she takes it. -I have recommended taking Advil once or twice daily. -She was instructed to use her cane when walking.

## 2019-05-29 LAB — PROTEIN ELECTROPHORESIS, SERUM
A/G Ratio: 1.1 (ref 0.7–1.7)
Albumin ELP: 4.2 g/dL (ref 2.9–4.4)
Alpha-1-Globulin: 0.2 g/dL (ref 0.0–0.4)
Alpha-2-Globulin: 0.6 g/dL (ref 0.4–1.0)
Beta Globulin: 1.2 g/dL (ref 0.7–1.3)
Gamma Globulin: 1.9 g/dL — ABNORMAL HIGH (ref 0.4–1.8)
Globulin, Total: 3.8 g/dL (ref 2.2–3.9)
Total Protein ELP: 8 g/dL (ref 6.0–8.5)

## 2019-05-30 LAB — IMMUNOFIXATION ELECTROPHORESIS
IgA: 283 mg/dL (ref 87–352)
IgG (Immunoglobin G), Serum: 2017 mg/dL — ABNORMAL HIGH (ref 586–1602)
IgM (Immunoglobulin M), Srm: 178 mg/dL (ref 26–217)
Total Protein ELP: 7.9 g/dL (ref 6.0–8.5)

## 2019-06-06 ENCOUNTER — Ambulatory Visit (HOSPITAL_COMMUNITY): Payer: No Typology Code available for payment source

## 2019-06-07 ENCOUNTER — Ambulatory Visit (HOSPITAL_COMMUNITY)
Admission: RE | Admit: 2019-06-07 | Discharge: 2019-06-07 | Disposition: A | Discharge status: Home / Self Care | Payer: PRIVATE HEALTH INSURANCE | Source: Ambulatory Visit | Attending: Hematology | Admitting: Hematology

## 2019-06-07 ENCOUNTER — Other Ambulatory Visit: Payer: Self-pay

## 2019-06-07 DIAGNOSIS — M899 Disorder of bone, unspecified: Secondary | ICD-10-CM | POA: Insufficient documentation

## 2019-06-07 MED ORDER — GADOBUTROL 1 MMOL/ML IV SOLN
10.0000 mL | Freq: Once | INTRAVENOUS | Status: AC | PRN
  Administered 2019-06-07: 17:00:00 10 mL via INTRAVENOUS

## 2019-06-10 ENCOUNTER — Ambulatory Visit (HOSPITAL_COMMUNITY)
Admission: RE | Admit: 2019-06-10 | Discharge: 2019-06-10 | Disposition: A | Discharge status: Home / Self Care | Payer: PRIVATE HEALTH INSURANCE | Source: Ambulatory Visit | Attending: Hematology | Admitting: Hematology

## 2019-06-10 ENCOUNTER — Encounter (HOSPITAL_COMMUNITY): Payer: Self-pay | Admitting: Hematology

## 2019-06-10 ENCOUNTER — Inpatient Hospital Stay (HOSPITAL_BASED_OUTPATIENT_CLINIC_OR_DEPARTMENT_OTHER): Payer: PRIVATE HEALTH INSURANCE | Admitting: Hematology

## 2019-06-10 ENCOUNTER — Other Ambulatory Visit: Payer: Self-pay

## 2019-06-10 VITALS — BP 156/70 | HR 66 | Temp 97.3°F | Resp 18 | Wt 246.0 lb

## 2019-06-10 DIAGNOSIS — K760 Fatty (change of) liver, not elsewhere classified: Secondary | ICD-10-CM | POA: Insufficient documentation

## 2019-06-10 DIAGNOSIS — M899 Disorder of bone, unspecified: Secondary | ICD-10-CM | POA: Diagnosis present

## 2019-06-10 DIAGNOSIS — I700 Atherosclerosis of aorta: Secondary | ICD-10-CM | POA: Diagnosis not present

## 2019-06-10 DIAGNOSIS — D729 Disorder of white blood cells, unspecified: Secondary | ICD-10-CM | POA: Diagnosis not present

## 2019-06-10 DIAGNOSIS — D472 Monoclonal gammopathy: Secondary | ICD-10-CM | POA: Diagnosis not present

## 2019-06-10 LAB — GLUCOSE, CAPILLARY: Glucose-Capillary: 109 mg/dL — ABNORMAL HIGH (ref 70–99)

## 2019-06-10 MED ORDER — FLUDEOXYGLUCOSE F - 18 (FDG) INJECTION: 12.5000 | Freq: Once | INTRAVENOUS | Status: DC | PRN

## 2019-06-10 MED ORDER — TRAMADOL HCL 50 MG PO TABS: 50.0000 mg | ORAL_TABLET | Freq: Four times a day (QID) | ORAL | 0 refills | Status: DC | PRN

## 2019-06-10 NOTE — Patient Instructions (Signed)
[place] at [facility] Discharge Instructions  You were seen today by Dr. Delton Coombes. He went over your recent scan results. The only spot that was seen was the spot on L3/L4. He recommends you have a biopsy on the area to further evaluate. He will see you back after your biopsy for follow up.   Thank you for choosing [place] at [facility] to provide your oncology and hematology care.  To afford each patient quality time with our provider, please arrive at least 15 minutes before your scheduled appointment time.   If you have a lab appointment with the [facility] please come in thru the  Main Entrance and check in at the main information desk  You need to re-schedule your appointment should you arrive 10 or more minutes late.  We strive to give you quality time with our providers, and arriving late affects you and other patients whose appointments are after yours.  Also, if you no show three or more times for appointments you may be dismissed from the clinic at the providers discretion.     Again, thank you for choosing [facility].  Our hope is that these requests will decrease the amount of time that you wait before being seen by our physicians.       _____________________________________________________________  Should you have questions after your visit to [facility], please contact our office at [phone] between the hours of 8:00 a.m. and 4:30 p.m.  Voicemails left after 4:00 p.m. will not be returned until the following business day.  For prescription refill requests, have your pharmacy contact our office and allow 72 hours.    Cancer Center Support Programs:   > Cancer Support Group  2nd Tuesday of the month 1pm-2pm, Journey Room

## 2019-06-10 NOTE — Assessment & Plan Note (Addendum)
1.  IgG kappa monoclonal gammopathy: -Serum immunofixation on 05/28/2019 showed IgG kappa monoclonal protein.  SPEP did not show any M spike. -I have recommended free light chain assay and beta-2 microglobulin.  We will also check 24-hour urine for UPEP, urine immunofixation and light chains.  I will also repeat immunofixation and SPEP.  Her creatinine, calcium and hemoglobin was normal. -We reviewed results of the MRI of the lumbar spine which showed aggressive, contrast-enhancing L4 lesion with posterior cortical breakthrough with mild thecal sac narrowing.  No spinal canal or neural foraminal stenosis.  No other areas on the spine.  MRI from 2018 showed L4 lesion but was much smaller. -PET scan on 06/10/2019 reviewed by me showed mildly increased FDG uptake associated with mixed lytic and sclerotic lesion involving the L4 vertebral body.  SUV max is 4.4.  This remains indeterminate.  No other active areas of uptake seen on the PET scan. -Differential diagnosis includes plasmacytoma. -I have recommended biopsy of the L4 vertebral body.  We will reach out to interventional radiology to see if they can do it under sedation per the request of the patient. -I will see her back after the biopsy.  2.  Low back pain: -She has tried hydrocodone but it causes drowsiness. -She is taking Aleve but it is not completely controlling it. -We will start her on tramadol 50 mg to be taken as needed.  I have sent a prescription for it.

## 2019-06-10 NOTE — Progress Notes (Signed)
[place], [place] [address]   CLINIC:  Medical Oncology/Hematology  PCP:  Scherrie Bateman [place] [id number] [phone]   REASON FOR VISIT:  Follow-up for L4 bone lesion.  CURRENT THERAPY: Under work-up.    INTERVAL HISTORY:  Hannah Cooper 64 y.o. female seen for follow-up of L4 bone lesion.  She had MRI of the thoracic spine as well as a PET scan done in the interim.  She reports back pain rating 9 out of 10.  Appetite is 75%.  Energy levels are 50%.  Denies any fevers, night sweats or weight loss.  Reports shortness of breath when walking.    REVIEW OF SYSTEMS:  Review of Systems  Respiratory: Positive for shortness of breath.   Musculoskeletal: Positive for back pain.  Neurological: Positive for numbness.  All other systems reviewed and are negative.    PAST MEDICAL/SURGICAL HISTORY:  Past Medical History:  Diagnosis Date  . Arthritis   . Atypical chest pain    chronic  . Chronic back pain   . Chronic diastolic CHF (congestive heart failure) ([place])   . COPD (chronic obstructive pulmonary disease) ([place])   . DDD (degenerative disc disease), cervical   . Hyperlipidemia   . Hypertension   . Lumbar radiculopathy   . Meningitis   . Normal coronary arteries 2014  . Palpitations   . Premature atrial contractions   . PVC's (premature ventricular contractions)    Past Surgical History:  Procedure Laterality Date  . ABDOMINAL HYSTERECTOMY    . LEFT HEART CATHETERIZATION WITH CORONARY ANGIOGRAM N/A 09/05/2012   Procedure: LEFT HEART CATHETERIZATION WITH CORONARY ANGIOGRAM;  Surgeon: Wellington Hampshire, MD;  Location: [facility];  Service: Cardiovascular;  Laterality: N/A;  . THYROID SURGERY  2002     SOCIAL HISTORY:  Social History   Socioeconomic History  . Marital status: Married    Spouse name: Not on file  . Number of children: Not on file  . Years of education: Not on file  . Highest  education level: Not on file  Occupational History  . Not on file  Tobacco Use  . Smoking status: Never Smoker  . Smokeless tobacco: Never Used  Substance and Sexual Activity  . Alcohol use: No    Alcohol/week: 0.0 standard drinks  . Drug use: No  . Sexual activity: Yes    Partners: Male  Other Topics Concern  . Not on file  Social History Narrative  . Not on file   Social Determinants of Health   Financial Resource Strain: Low Risk   . Difficulty of Paying Living Expenses: Not hard at all  Food Insecurity: No Food Insecurity  . Worried About Charity fundraiser in the Last Year: Never true  . Ran Out of Food in the Last Year: Never true  Transportation Needs: No Transportation Needs  . Lack of Transportation (Medical): No  . Lack of Transportation (Non-Medical): No  Physical Activity: Inactive  . Days of Exercise per Week: 0 days  . Minutes of Exercise per Session: 0 min  Stress: Stress Concern Present  . Feeling of Stress : To some extent  Social Connections: Not Isolated  . Frequency of Communication with Friends and Family: More than three times a week  . Frequency of Social Gatherings with Friends and Family: Never  . Attends Religious Services: More than 4 times per year  . Active Member of Clubs or Organizations: Yes  .  Attends Archivist Meetings: More than 4 times per year  . Marital Status: Married  Human resources officer Violence: Not At Risk  . Fear of Current or Ex-Partner: No  . Emotionally Abused: No  . Physically Abused: No  . Sexually Abused: No    FAMILY HISTORY:  Family History  Problem Relation Age of Onset  . Heart attack Mother 23  . Heart attack Sister 63    CURRENT MEDICATIONS:  Outpatient Encounter Medications as of 06/10/2019  Medication Sig Note  . amLODipine (NORVASC) 10 MG tablet Take 1 tablet (10 mg total) by mouth daily.   Marland Kitchen aspirin 325 MG tablet Take 1 tablet (325 mg total) by mouth daily.   . cycloSPORINE (RESTASIS) 0.05 %  ophthalmic emulsion Place 1 drop into both eyes 2 (two) times daily.    . isosorbide mononitrate (IMDUR) 30 MG 24 hr tablet TAKE 1 TABLET (30 MG TOTAL) BY MOUTH DAILY. 10/17/2017: Patient's prescription ran out and has yet to get refilled  . metoprolol tartrate (LOPRESSOR) 50 MG tablet Take 0.5 tablets (25 mg total) by mouth 2 (two) times daily.   Marland Kitchen acetaminophen (TYLENOL) 500 MG tablet Take 1,000 mg by mouth every 6 (six) hours as needed for headache (pain).    Marland Kitchen albuterol (PROVENTIL) (2.5 MG/3ML) 0.083% nebulizer solution Take 2.5 mg by nebulization every 6 (six) hours as needed for wheezing or shortness of breath.   . Albuterol Sulfate (PROAIR RESPICLICK) 123XX123 (90 Base) MCG/ACT AEPB Inhale 2 puffs into the lungs every 4 (four) hours as needed (shortness of breath/ wheezing).   . furosemide (LASIX) 20 MG tablet Take 1 tablet daily as needed may take 2 tablets prn (Patient not taking: Reported on 05/28/2019)   . HYDROcodone-acetaminophen (NORCO/VICODIN) 5-325 MG tablet 1 or 2 tabs PO q8 hours prn pain (Patient not taking: Reported on 05/28/2019)   . ibuprofen (ADVIL,MOTRIN) 800 MG tablet Take 1 tablet by mouth 2 (two) times daily as needed (back pain).    . nitroGLYCERIN (NITROSTAT) 0.4 MG SL tablet Place 1 tablet (0.4 mg total) under the tongue every 5 (five) minutes as needed for chest pain. (Patient not taking: Reported on 05/28/2019) 03/13/2016: 2 doses Thursday  . traMADol (ULTRAM) 50 MG tablet Take 1 tablet (50 mg total) by mouth every 6 (six) hours as needed.    Facility-Administered Encounter Medications as of 06/10/2019  Medication  . fludeoxyglucose F - 18 (FDG) injection [id number] millicurie    ALLERGIES:  Allergies  Allergen Reactions  . Other Swelling    [place] lipstick     PHYSICAL EXAM:  ECOG Performance status: 1  Vitals:   06/10/19 1603  BP: (!) 156/70  Pulse: 66  Resp: 18  Temp: (!) 97.3 F (36.3 C)  SpO2: 100%   Filed Weights   06/10/19 1603  Weight: 246 lb (111.6 kg)     Physical Exam Vitals reviewed.  Constitutional:      Appearance: Normal appearance.  Musculoskeletal:        General: No swelling.  Neurological:     General: No focal deficit present.     Mental Status: She is alert and oriented to person, place, and time.  Psychiatric:        Mood and Affect: Mood normal.        Behavior: Behavior normal.      LABORATORY DATA:  I have reviewed the labs as listed.  CBC    Component Value Date/Time   WBC 3.7 (L) 05/28/2019 1504  RBC 4.50 05/28/2019 1504   HGB 13.3 05/28/2019 1504   HCT 40.7 05/28/2019 1504   PLT 273 05/28/2019 1504   MCV 90.4 05/28/2019 1504   MCH 29.6 05/28/2019 1504   MCHC 32.7 05/28/2019 1504   RDW 14.0 05/28/2019 1504   LYMPHSABS 1.7 05/28/2019 1504   MONOABS 0.4 05/28/2019 1504   EOSABS 0.1 05/28/2019 1504   BASOSABS 0.0 05/28/2019 1504   CMP Latest Ref Rng & Units 05/28/2019 10/17/2017 10/07/2017  Glucose 70 - 99 mg/dL 93 105(H) 131(H)  BUN 8 - 23 mg/dL 12 16 12   Creatinine 0.44 - 1.00 mg/dL 0.77 0.73 0.77  Sodium 135 - 145 mmol/L 140 140 140  Potassium 3.5 - 5.1 mmol/L 3.8 3.9 3.9  Chloride 98 - 111 mmol/L 105 107 107  CO2 22 - 32 mmol/L 27 28 25   Calcium 8.9 - 10.3 mg/dL 9.3 8.9 8.9  Total Protein 6.5 - 8.1 g/dL 8.5(H) - -  Total Bilirubin 0.3 - 1.2 mg/dL 0.6 - -  Alkaline Phos 38 - 126 U/L 74 - -  AST 15 - 41 U/L 33 - -  ALT 0 - 44 U/L 49(H) - -       DIAGNOSTIC IMAGING:  I have independently reviewed the scans and discussed with the patient.     ASSESSMENT & PLAN:   Plasma cell disorder 1.  IgG kappa monoclonal gammopathy: -Serum immunofixation on 05/28/2019 showed IgG kappa monoclonal protein.  SPEP did not show any M spike. -I have recommended free light chain assay and beta-2 microglobulin.  We will also check 24-hour urine for UPEP, urine immunofixation and light chains.  I will also repeat immunofixation and SPEP.  Her creatinine, calcium and hemoglobin was normal. -We reviewed results  of the MRI of the lumbar spine which showed aggressive, contrast-enhancing L4 lesion with posterior cortical breakthrough with mild thecal sac narrowing.  No spinal canal or neural foraminal stenosis.  No other areas on the spine.  MRI from 2018 showed L4 lesion but was much smaller. -PET scan on 06/10/2019 reviewed by me showed mildly increased FDG uptake associated with mixed lytic and sclerotic lesion involving the L4 vertebral body.  SUV max is 4.4.  This remains indeterminate.  No other active areas of uptake seen on the PET scan. -Differential diagnosis includes plasmacytoma. -I have recommended biopsy of the L4 vertebral body.  We will reach out to interventional radiology to see if they can do it under sedation per the request of the patient. -I will see her back after the biopsy.  2.  Low back pain: -She has tried hydrocodone but it causes drowsiness. -She is taking Aleve but it is not completely controlling it. -We will start her on tramadol 50 mg to be taken as needed.  I have sent a prescription for it.      Orders placed this encounter:  Orders Placed This Encounter  Procedures  . CT Biopsy  . Beta 2 microglobuline, serum  . Immunofixation electrophoresis  . Kappa/lambda light chains  . 24 hr, Ur UPEP/UIFE/Light Chains/TP  . Beta 2 microglobuline, serum  . Kappa/lambda light chains  . Immunofixation electrophoresis  . Protein electrophoresis, serum      Derek Jack, MD [place] [phone]

## 2019-06-11 ENCOUNTER — Inpatient Hospital Stay (HOSPITAL_COMMUNITY): Payer: PRIVATE HEALTH INSURANCE

## 2019-06-11 DIAGNOSIS — M899 Disorder of bone, unspecified: Secondary | ICD-10-CM

## 2019-06-11 DIAGNOSIS — D472 Monoclonal gammopathy: Secondary | ICD-10-CM | POA: Diagnosis not present

## 2019-06-12 ENCOUNTER — Telehealth (HOSPITAL_COMMUNITY): Payer: Self-pay

## 2019-06-12 ENCOUNTER — Encounter (HOSPITAL_COMMUNITY): Payer: Self-pay | Admitting: Radiology

## 2019-06-12 DIAGNOSIS — D472 Monoclonal gammopathy: Secondary | ICD-10-CM | POA: Diagnosis not present

## 2019-06-12 LAB — PROTEIN ELECTROPHORESIS, SERUM
A/G Ratio: 1.2 (ref 0.7–1.7)
Albumin ELP: 4.2 g/dL (ref 2.9–4.4)
Alpha-1-Globulin: 0.2 g/dL (ref 0.0–0.4)
Alpha-2-Globulin: 0.5 g/dL (ref 0.4–1.0)
Beta Globulin: 1.1 g/dL (ref 0.7–1.3)
Gamma Globulin: 1.8 g/dL (ref 0.4–1.8)
Globulin, Total: 3.6 g/dL (ref 2.2–3.9)
Total Protein ELP: 7.8 g/dL (ref 6.0–8.5)

## 2019-06-12 LAB — KAPPA/LAMBDA LIGHT CHAINS
Kappa free light chain: 25.3 mg/L — ABNORMAL HIGH (ref 3.3–19.4)
Kappa, lambda light chain ratio: 1.16 (ref 0.26–1.65)
Lambda free light chains: 21.9 mg/L (ref 5.7–26.3)

## 2019-06-12 LAB — BETA 2 MICROGLOBULIN, SERUM: Beta-2 Microglobulin: 2.2 mg/L (ref 0.6–2.4)

## 2019-06-12 NOTE — Progress Notes (Signed)
Hannah Cooper Female, 64 y.o., December 02, 1955 MRN:  [id number] Phone:  [phone] [name]) PCP:  Jake Samples, PA-C Primary Cvg:  Phcs Multiplan/Multiplan Phcs  RE: CT Biopsy Received: Yesterday Message Contents  [name], MD  [name]  Ok   Do in IR not CT   Spine rad    DDH       Previous Messages   ----- Message -----  From: Garth Bigness D  Sent: 06/11/2019  1:50 PM EST  To: Ir Procedure Requests  Subject: FW: CT Biopsy                    ----- Message -----  From: [name]  Sent: 06/10/2019  4:57 PM EST  To: Ir Procedure Requests  Subject: CT Biopsy                     Procedure: CT Biopsy   Reason: Lesion of bone of lumbosacral spine, L3/L4 bone lesion   History: MR, NM PET in computer   Provider: Derek Jack   Provider Contact: [phone]

## 2019-06-12 NOTE — Telephone Encounter (Signed)
Called to schedule bone biopsy, no answer, left vm. AW

## 2019-06-13 LAB — IMMUNOFIXATION ELECTROPHORESIS
IgA: 261 mg/dL (ref 87–352)
IgG (Immunoglobin G), Serum: 1953 mg/dL — ABNORMAL HIGH (ref 586–1602)
IgM (Immunoglobulin M), Srm: 169 mg/dL (ref 26–217)
Total Protein ELP: 7.7 g/dL (ref 6.0–8.5)

## 2019-06-14 ENCOUNTER — Other Ambulatory Visit (HOSPITAL_COMMUNITY): Payer: Self-pay

## 2019-06-14 DIAGNOSIS — M899 Disorder of bone, unspecified: Secondary | ICD-10-CM

## 2019-06-14 DIAGNOSIS — D729 Disorder of white blood cells, unspecified: Secondary | ICD-10-CM

## 2019-06-14 LAB — UPEP/UIFE/LIGHT CHAINS/TP, 24-HR UR
% BETA, Urine: 19.7 %
ALPHA 1 URINE: 2.6 %
Albumin, U: 48.4 %
Alpha 2, Urine: 14.8 %
Free Kappa Lt Chains,Ur: 100.7 mg/L (ref 0.63–113.79)
Free Kappa/Lambda Ratio: 4.76 (ref 1.03–31.76)
Free Lambda Lt Chains,Ur: 21.15 mg/L — ABNORMAL HIGH (ref 0.47–11.77)
GAMMA GLOBULIN URINE: 14.6 %
Total Protein, Urine-Ur/day: 130 mg/24 hr (ref 30–150)
Total Protein, Urine: 14 mg/dL
Total Volume: 925

## 2019-06-14 MED ORDER — MISC. DEVICES MISC: 0 refills | Status: DC

## 2019-06-17 ENCOUNTER — Other Ambulatory Visit (HOSPITAL_COMMUNITY): Payer: Self-pay | Admitting: *Deleted

## 2019-06-17 MED ORDER — VALACYCLOVIR HCL 1 G PO TABS: 1000.0000 mg | ORAL_TABLET | Freq: Three times a day (TID) | ORAL | 0 refills | Status: DC

## 2019-06-17 NOTE — Telephone Encounter (Signed)
Patient called clinic reporting that she had some burning develop under her breasts on Friday, she now has some swelling and it is tender to touch.  She reports that she has had shingles in this area before with the same symptoms. She denies any blisters or sores at this time.  Per Dr. Delton Coombes, orders received for valtrex.  Patient is aware.

## 2019-06-18 ENCOUNTER — Other Ambulatory Visit: Payer: Self-pay | Admitting: Radiology

## 2019-06-19 ENCOUNTER — Ambulatory Visit (HOSPITAL_COMMUNITY)
Admission: RE | Admit: 2019-06-19 | Discharge: 2019-06-19 | Disposition: A | Discharge status: Home / Self Care | Payer: No Typology Code available for payment source | Source: Ambulatory Visit | Attending: Hematology | Admitting: Hematology

## 2019-06-19 ENCOUNTER — Other Ambulatory Visit: Payer: Self-pay

## 2019-06-19 ENCOUNTER — Encounter (HOSPITAL_COMMUNITY): Payer: Self-pay

## 2019-06-19 DIAGNOSIS — I5032 Chronic diastolic (congestive) heart failure: Secondary | ICD-10-CM | POA: Diagnosis not present

## 2019-06-19 DIAGNOSIS — Z7982 Long term (current) use of aspirin: Secondary | ICD-10-CM | POA: Insufficient documentation

## 2019-06-19 DIAGNOSIS — Z79899 Other long term (current) drug therapy: Secondary | ICD-10-CM | POA: Diagnosis not present

## 2019-06-19 DIAGNOSIS — J449 Chronic obstructive pulmonary disease, unspecified: Secondary | ICD-10-CM | POA: Insufficient documentation

## 2019-06-19 DIAGNOSIS — I110 Hypertensive heart disease with heart failure: Secondary | ICD-10-CM | POA: Diagnosis not present

## 2019-06-19 DIAGNOSIS — M1990 Unspecified osteoarthritis, unspecified site: Secondary | ICD-10-CM | POA: Diagnosis not present

## 2019-06-19 DIAGNOSIS — Z8249 Family history of ischemic heart disease and other diseases of the circulatory system: Secondary | ICD-10-CM | POA: Insufficient documentation

## 2019-06-19 DIAGNOSIS — E785 Hyperlipidemia, unspecified: Secondary | ICD-10-CM | POA: Insufficient documentation

## 2019-06-19 DIAGNOSIS — M899 Disorder of bone, unspecified: Secondary | ICD-10-CM | POA: Insufficient documentation

## 2019-06-19 HISTORY — PX: IR FLUORO GUIDED NEEDLE PLC ASPIRATION/INJECTION LOC: IMG2395

## 2019-06-19 LAB — CBC WITH DIFFERENTIAL/PLATELET
Abs Immature Granulocytes: 0.01 10*3/uL (ref 0.00–0.07)
Basophils Absolute: 0 10*3/uL (ref 0.0–0.1)
Basophils Relative: 1 %
Eosinophils Absolute: 0.1 10*3/uL (ref 0.0–0.5)
Eosinophils Relative: 2 %
HCT: 40.8 % (ref 36.0–46.0)
Hemoglobin: 13.4 g/dL (ref 12.0–15.0)
Immature Granulocytes: 0 %
Lymphocytes Relative: 41 %
Lymphs Abs: 1.6 10*3/uL (ref 0.7–4.0)
MCH: 29.4 pg (ref 26.0–34.0)
MCHC: 32.8 g/dL (ref 30.0–36.0)
MCV: 89.5 fL (ref 80.0–100.0)
Monocytes Absolute: 0.4 10*3/uL (ref 0.1–1.0)
Monocytes Relative: 9 %
Neutro Abs: 1.9 10*3/uL (ref 1.7–7.7)
Neutrophils Relative %: 47 %
Platelets: 256 10*3/uL (ref 150–400)
RBC: 4.56 MIL/uL (ref 3.87–5.11)
RDW: 13.8 % (ref 11.5–15.5)
WBC: 3.9 10*3/uL — ABNORMAL LOW (ref 4.0–10.5)
nRBC: 0 % (ref 0.0–0.2)

## 2019-06-19 LAB — BASIC METABOLIC PANEL
Anion gap: 10 (ref 5–15)
BUN: 10 mg/dL (ref 8–23)
CO2: 24 mmol/L (ref 22–32)
Calcium: 9.4 mg/dL (ref 8.9–10.3)
Chloride: 106 mmol/L (ref 98–111)
Creatinine, Ser: 0.72 mg/dL (ref 0.44–1.00)
GFR calc Af Amer: >60 mL/min (ref >60)
GFR calc non Af Amer: >60 mL/min (ref >60)
Glucose, Bld: 107 mg/dL — ABNORMAL HIGH (ref 70–99)
Potassium: 4.1 mmol/L (ref 3.5–5.1)
Sodium: 140 mmol/L (ref 135–145)

## 2019-06-19 LAB — PROTIME-INR
INR: 1 (ref 0.8–1.2)
Prothrombin Time: 12.8 seconds (ref 11.4–15.2)

## 2019-06-19 MED ORDER — GELATIN ABSORBABLE 12-7 MM EX MISC
CUTANEOUS | Status: AC
  Filled 2019-06-19: qty 1

## 2019-06-19 MED ORDER — FENTANYL CITRATE (PF) 100 MCG/2ML IJ SOLN
INTRAMUSCULAR | Status: AC | PRN
  Administered 2019-06-19: 50 ug via INTRAVENOUS

## 2019-06-19 MED ORDER — FENTANYL CITRATE (PF) 100 MCG/2ML IJ SOLN
INTRAMUSCULAR | Status: AC
  Filled 2019-06-19: qty 2

## 2019-06-19 MED ORDER — MIDAZOLAM HCL 2 MG/2ML IJ SOLN
INTRAMUSCULAR | Status: AC | PRN
  Administered 2019-06-19: 1 mg via INTRAVENOUS

## 2019-06-19 MED ORDER — HYDROCODONE-ACETAMINOPHEN 5-325 MG PO TABS
1.0000 | ORAL_TABLET | ORAL | Status: DC | PRN
  Administered 2019-06-19: 1 via ORAL
  Filled 2019-06-19: qty 1

## 2019-06-19 MED ORDER — SODIUM CHLORIDE 0.9 % IV SOLN: INTRAVENOUS | Status: DC

## 2019-06-19 MED ORDER — MIDAZOLAM HCL 2 MG/2ML IJ SOLN
INTRAMUSCULAR | Status: AC
  Filled 2019-06-19: qty 2

## 2019-06-19 MED ORDER — IOHEXOL 300 MG/ML  SOLN
50.0000 mL | Freq: Once | INTRAMUSCULAR | Status: AC | PRN
  Administered 2019-06-19: 5 mL

## 2019-06-19 MED ORDER — LIDOCAINE HCL (PF) 1 % IJ SOLN
INTRAMUSCULAR | Status: AC
  Filled 2019-06-19: qty 30

## 2019-06-19 NOTE — Discharge Instructions (Signed)

## 2019-06-19 NOTE — Procedures (Signed)
Interventional Radiology Procedure Note  Procedure: Transpedicular bx of L4 lesion via left transpedicular approach.   Complications: None  Estimated Blood Loss: None  Recommendations: - DC home after 2 hrs bedrest  Signed,  Criselda Peaches, MD

## 2019-06-19 NOTE — Progress Notes (Signed)
Dr Laurence Ferrari notified of client's blood pressure and no new orders noted and ok to d/c home

## 2019-06-19 NOTE — Progress Notes (Signed)
Chief Complaint: Patient was seen in consultation today for bone lesions  Referring Physician(s): Katragadda,Sreedhar  Supervising Physician: Jacqulynn Cadet  Patient Status: [facility] - Out-pt  History of Present Illness: Hannah Cooper is a 64 y.o. female with recent findings of IgG kappa monoclonal gammopathy. She reports feeling well at home with the exception of excruciating back pain.  She was evaluated by her PCP who recommended physical therapy, however this made the pain worse. She  underwent MRI Lumbar Spine 06/07/19 which showed: Interval progression in the large L4 vertebral body osseous lesion with aggressive features. Now showing cortical breakthrough and causing mild-to-moderate central canal narrowing. There is significant surrounding marrow edema which extends into the bilateral pedicles. Given the interval progression this likely represents in osseous primary neoplasm.   On subsequent PET scan 06/10/19: Mildly increased FDG uptake associated with the mixed lytic and sclerotic lesion involving the L4 vertebral body. The SUV max is equal to 4.4. This remains indeterminate. Differential considerations include slow growing indolent neoplastic process versus atypical presentation of a benign etiology such as vertebral hemangioma. Tissue sampling may be necessary for definitive diagnosis. No additional bone lesions are identified.  Patient referred to IR for possible bone lesion biopsy.  Case reviewed and approved by Dr. Vernard Gambles.  Patient presents for procedure today.  She reports she developed shingles-type pain under breast on Friday of last week.  She called her MD office earlier this week and received a prescription for Valtrex.  She has complete 2 full days of treatment with improvement in her symptoms.   She is NPO.  She does not take blood thinners.   Past Medical History:  Diagnosis Date  . Arthritis   . Atypical chest pain    chronic  . Chronic back  pain   . Chronic diastolic CHF (congestive heart failure) ([place])   . COPD (chronic obstructive pulmonary disease) ([place])   . DDD (degenerative disc disease), cervical   . Hyperlipidemia   . Hypertension   . Lumbar radiculopathy   . Meningitis   . Normal coronary arteries 2014  . Palpitations   . Premature atrial contractions   . PVC's (premature ventricular contractions)     Past Surgical History:  Procedure Laterality Date  . ABDOMINAL HYSTERECTOMY    . LEFT HEART CATHETERIZATION WITH CORONARY ANGIOGRAM N/A 09/05/2012   Procedure: LEFT HEART CATHETERIZATION WITH CORONARY ANGIOGRAM;  Surgeon: Wellington Hampshire, MD;  Location: [facility];  Service: Cardiovascular;  Laterality: N/A;  . THYROID SURGERY  2002    Allergies: Other  Medications: Prior to Admission medications   Medication Sig Start Date End Date Taking? Authorizing Provider  acetaminophen (TYLENOL) 500 MG tablet Take 1,000 mg by mouth every 6 (six) hours as needed for headache (pain).    Yes [provider]  albuterol (PROVENTIL) (2.5 MG/3ML) 0.083% nebulizer solution Take 2.5 mg by nebulization every 6 (six) hours as needed for wheezing or shortness of breath.   Yes [provider]  Albuterol Sulfate (PROAIR RESPICLICK) 123XX123 (90 Base) MCG/ACT AEPB Inhale 2 puffs into the lungs every 4 (four) hours as needed (shortness of breath/ wheezing).   Yes [provider]  aspirin EC 81 MG tablet Take 81 mg by mouth daily.   Yes [provider]  cycloSPORINE (RESTASIS) 0.05 % ophthalmic emulsion Place 1 drop into both eyes 2 (two) times daily.    Yes [provider]  furosemide (LASIX) 20 MG tablet Take 1 tablet daily as needed may take  2 tablets prn Patient taking differently: Take 20 mg by mouth 2 (two) times daily as needed (fluid retention.).  11/10/16  Yes Branch, Alphonse Guild, MD  HYDROcodone-acetaminophen (NORCO) 10-325 MG tablet Take 1 tablet by mouth 2 (two) times daily as needed  (pain.).   Yes [provider]  ibuprofen (ADVIL,MOTRIN) 800 MG tablet Take 1 tablet by mouth 2 (two) times daily as needed (back pain).  08/01/14  Yes [provider]  isosorbide mononitrate (IMDUR) 30 MG 24 hr tablet TAKE 1 TABLET (30 MG TOTAL) BY MOUTH DAILY. Patient taking differently: Take 30 mg by mouth daily.  04/19/17  Yes Satira Sark, MD  metoprolol tartrate (LOPRESSOR) 100 MG tablet Take 100 mg by mouth 2 (two) times daily.   Yes [provider]  traMADol (ULTRAM) 50 MG tablet Take 1 tablet (50 mg total) by mouth every 6 (six) hours as needed. Patient taking differently: Take 50 mg by mouth every 6 (six) hours as needed (for pain.).  06/10/19  Yes Derek Jack, MD  Misc. Devices MISC SHOWER CHAIR 06/14/19   Lockamy, Randi L, NP-C  Misc. Devices MISC ELEVATED TOILET CHAIR 06/14/19   Lockamy, Randi L, NP-C  valACYclovir (VALTREX) 1000 MG tablet Take 1 tablet (1,000 mg total) by mouth 3 (three) times daily. 06/17/19   Derek Jack, MD     Family History  Problem Relation Age of Onset  . Heart attack Mother 35  . Heart attack Sister 66    Social History   Socioeconomic History  . Marital status: Married    Spouse name: Not on file  . Number of children: Not on file  . Years of education: Not on file  . Highest education level: Not on file  Occupational History  . Not on file  Tobacco Use  . Smoking status: Never Smoker  . Smokeless tobacco: Never Used  Substance and Sexual Activity  . Alcohol use: No    Alcohol/week: 0.0 standard drinks  . Drug use: No  . Sexual activity: Yes    Partners: Male  Other Topics Concern  . Not on file  Social History Narrative  . Not on file   Social Determinants of Health   Financial Resource Strain: Low Risk   . Difficulty of Paying Living Expenses: Not hard at all  Food Insecurity: No Food Insecurity  . Worried About Charity fundraiser in the Last Year: Never true  . Ran Out of Food in the  Last Year: Never true  Transportation Needs: No Transportation Needs  . Lack of Transportation (Medical): No  . Lack of Transportation (Non-Medical): No  Physical Activity: Inactive  . Days of Exercise per Week: 0 days  . Minutes of Exercise per Session: 0 min  Stress: Stress Concern Present  . Feeling of Stress : To some extent  Social Connections: Not Isolated  . Frequency of Communication with Friends and Family: More than three times a week  . Frequency of Social Gatherings with Friends and Family: Never  . Attends Religious Services: More than 4 times per year  . Active Member of Clubs or Organizations: Yes  . Attends Archivist Meetings: More than 4 times per year  . Marital Status: Married     Review of Systems: A 12 point ROS discussed and pertinent positives are indicated in the HPI above.  All other systems are negative.  Review of Systems  Constitutional: Negative for fatigue and fever.  Respiratory: Negative for cough and shortness  of breath.   Cardiovascular: Negative for chest pain.  Gastrointestinal: Negative for abdominal pain, nausea and vomiting.  Genitourinary: Negative for dysuria.  Musculoskeletal: Negative for back pain.  Psychiatric/Behavioral: Negative for behavioral problems and confusion.    Vital Signs: BP (!) 188/99   Pulse 63   Resp 16   Ht 5\' 5"  (1.651 m)   Wt 240 lb (108.9 kg)   SpO2 100%   BMI 39.94 kg/m   Physical Exam Vitals and nursing note reviewed.  Constitutional:      General: She is not in acute distress.    Appearance: She is not ill-appearing.  HENT:     Mouth/Throat:     Mouth: Mucous membranes are moist.     Pharynx: Oropharynx is clear.  Cardiovascular:     Rate and Rhythm: Normal rate and regular rhythm.  Pulmonary:     Effort: Pulmonary effort is normal. No respiratory distress.     Breath sounds: Normal breath sounds.  Chest:    Abdominal:     General: Abdomen is flat. There is no distension.      Palpations: Abdomen is soft.  Musculoskeletal:     Comments: No visible rash, tenderness, or pain on back.  No open wounds or draining lesions.   Skin:    General: Skin is warm and dry.  Neurological:     General: No focal deficit present.     Mental Status: She is alert and oriented to person, place, and time. Mental status is at baseline.  Psychiatric:        Mood and Affect: Mood normal.        Behavior: Behavior normal.        Thought Content: Thought content normal.        Judgment: Judgment normal.      MD Evaluation Airway: WNL Heart: WNL Abdomen: WNL Chest/ Lungs: WNL ASA  Classification: 3 Mallampati/Airway Score: One   Imaging: MR LUMBAR SPINE WO CONTRAST  Result Date: 05/23/2019 CLINICAL DATA:  Pain in lower back EXAM: MRI LUMBAR SPINE WITHOUT CONTRAST TECHNIQUE: Multiplanar, multisequence MR imaging of the lumbar spine was performed. No intravenous contrast was administered. COMPARISON:  Most recent prior June 09, 2016 FINDINGS: Segmentation: There are 5 non-rib bearing lumbar type vertebral bodies with the last intervertebral disc space labeled as L5-S1. Alignment:  Normal Vertebrae: There is a interval significant growth of a large T1 hypointense/T2 bright osseous lesion within the L4 vertebral body with significant surrounding marrow edema. The osseous lesion appears to have cortical breakthrough at the posterior L4 vertebral body causing mass effect upon the thecal sac. The thecal sac measures approximately 6 mm in AP dimension. There are areas of signal dropout seen within the posterior aspect of the mass which could represent calcifications or blood products. The mass also appears to cortical break throughout the lateral left aspect of the vertebral body. Mild marrow edema seen extending into the bilateral pedicles. No other osseous lesions are seen. Conus medullaris and cauda equina: Conus extends to the L1 level. Conus and cauda equina appear normal. Paraspinal and  other soft tissues: The paraspinal soft tissues and visualized retroperitoneal structures are unremarkable. The sacroiliac joints are intact. Disc levels: T12-L1:  No significant canal or neural foraminal narrowing. L1-L2: There is a broad-based disc bulge with facet arthrosis which causes mild bilateral neural foraminal narrowing. L2-L3: There is a broad-based disc bulge with facet arthrosis which causes mild bilateral neural foraminal narrowing. L3-L4: There is a broad-based disc bulge  with facet arthrosis which causes mild bilateral neural foraminal narrowing and mild central canal stenosis. L4-L5: As described above osseous lesion causing mild-to-moderate canal narrowing. At L4-L5 there is a broad-based disc bulge with facet arthrosis which causes moderate right and mild left neural foraminal narrowing there is mild central canal stenosis. L5-S1: There is a broad-based disc bulge and facet arthrosis which is asymmetric to the right which causes moderate bilateral neural foraminal narrowing, right greater than left. IMPRESSION: Interval progression in the large L4 vertebral body osseous lesion with aggressive features. Now showing cortical breakthrough and causing mild-to-moderate central canal narrowing. There is significant surrounding marrow edema which extends into the bilateral pedicles. Given the interval progression this likely represents in osseous primary neoplasm. Would recommend postcontrast images and surgical follow-up for further evaluation. These results will be called to the ordering clinician or representative by the Radiologist Assistant, and communication documented in the PACS or zVision Dashboard. Electronically Signed   By: Prudencio Pair M.D.   On: 05/23/2019 22:22   MR Thoracic Spine W Wo Contrast  Result Date: 06/07/2019 CLINICAL DATA:  Low back pain for 2 years, worsening in the last 6 months. Pain radiates to the left lower extremity. EXAM: MRI THORACIC AND LUMBAR SPINE WITHOUT AND  WITH CONTRAST TECHNIQUE: Multiplanar and multiecho pulse sequences of the thoracic and lumbar spine were obtained without and with intravenous contrast. CONTRAST:  47mL GADAVIST GADOBUTROL 1 MMOL/ML IV SOLN COMPARISON:  Lumbar spine MRI 05/23/2019 FINDINGS: MRI THORACIC SPINE FINDINGS Alignment:  Physiologic. Vertebrae: No fracture, evidence of discitis, or bone lesion. Cord:  Normal signal and morphology. Paraspinal and other soft tissues: Negative. Disc levels: Very small disc protrusions at the T5-8 levels. No stenosis. MRI LUMBAR SPINE FINDINGS Segmentation:  Standard. Alignment:  Physiologic. Vertebrae: The L4 lesion demonstrated on the previous MRI is unchanged in size. Postcontrast images show diffuse contrast enhancement. There is persistent posterior cortical breakthrough. Conus medullaris: Extends to the L1 level and appears normal. Paraspinal and other soft tissues: Negative Disc levels: L3-4: Moderate facet hypertrophy and mild disc bulge. No stenosis. Ventral epidural enhancement extending from the L4 lesion again causes mild thecal sac narrowing. L4-5: Mild disc bulge and moderate facet hypertrophy. No stenosis. L5-S1: Mild disc bulge and severe facet hypertrophy. No stenosis. IMPRESSION: 1. Unchanged aggressive, contrast-enhancing L4 lesion with posterior cortical breakthrough and mild thecal sac narrowing. 2. No spinal canal or neural foraminal stenosis otherwise. 3. Lower lumbar degenerative disc disease without stenosis. 4. Mild thoracic degenerative disc disease. Electronically Signed   By: Ulyses Jarred M.D.   On: 06/07/2019 22:58   MR Lumbar Spine W Wo Contrast  Result Date: 06/07/2019 CLINICAL DATA:  Low back pain for 2 years, worsening in the last 6 months. Pain radiates to the left lower extremity. EXAM: MRI THORACIC AND LUMBAR SPINE WITHOUT AND WITH CONTRAST TECHNIQUE: Multiplanar and multiecho pulse sequences of the thoracic and lumbar spine were obtained without and with intravenous  contrast. CONTRAST:  56mL GADAVIST GADOBUTROL 1 MMOL/ML IV SOLN COMPARISON:  Lumbar spine MRI 05/23/2019 FINDINGS: MRI THORACIC SPINE FINDINGS Alignment:  Physiologic. Vertebrae: No fracture, evidence of discitis, or bone lesion. Cord:  Normal signal and morphology. Paraspinal and other soft tissues: Negative. Disc levels: Very small disc protrusions at the T5-8 levels. No stenosis. MRI LUMBAR SPINE FINDINGS Segmentation:  Standard. Alignment:  Physiologic. Vertebrae: The L4 lesion demonstrated on the previous MRI is unchanged in size. Postcontrast images show diffuse contrast enhancement. There is persistent posterior cortical breakthrough. Conus  medullaris: Extends to the L1 level and appears normal. Paraspinal and other soft tissues: Negative Disc levels: L3-4: Moderate facet hypertrophy and mild disc bulge. No stenosis. Ventral epidural enhancement extending from the L4 lesion again causes mild thecal sac narrowing. L4-5: Mild disc bulge and moderate facet hypertrophy. No stenosis. L5-S1: Mild disc bulge and severe facet hypertrophy. No stenosis. IMPRESSION: 1. Unchanged aggressive, contrast-enhancing L4 lesion with posterior cortical breakthrough and mild thecal sac narrowing. 2. No spinal canal or neural foraminal stenosis otherwise. 3. Lower lumbar degenerative disc disease without stenosis. 4. Mild thoracic degenerative disc disease. Electronically Signed   By: Ulyses Jarred M.D.   On: 06/07/2019 22:58   NM PET Image Initial (PI) Skull Base To Thigh  Result Date: 06/10/2019 CLINICAL DATA:  Initial.  Treatment strategy for spine lesion. EXAM: NUCLEAR MEDICINE PET SKULL BASE TO THIGH TECHNIQUE: 12.5 mCi F-18 FDG was injected intravenously. Full-ring PET imaging was performed from the skull base to thigh after the radiotracer. CT data was obtained and used for attenuation correction and anatomic localization. Fasting blood glucose: 109 mg/dl COMPARISON:  MRI 06/07/2019 FINDINGS: Mediastinal blood pool  activity: SUV max 3.6 Liver activity: SUV max NA NECK: No hypermetabolic lymph nodes in the neck. Incidental CT findings: none CHEST: There is soft tissue stranding and multiple small anterior mediastinal lymph nodes are identified. Anatomically this is unchanged from 08/14/2014. Corresponding SUV max is equal to 5.05. No pleural effusions. Lungs are clear. No suspicious pulmonary nodule or mass identified. Incidental CT findings: Mild aortic atherosclerosis. ABDOMEN/PELVIS: No abnormal hypermetabolic activity within the liver, pancreas, adrenal glands, or spleen. No hypermetabolic lymph nodes in the abdomen or pelvis. Incidental CT findings: Hepatic steatosis. SKELETON: Corresponding to the MRI abnormality L4 vertebral body is of mixed lytic and sclerotic lesion involving the L4 vertebral body. This measures 3.04 cm and has an SUV max of 4.4. Incidental CT findings: none IMPRESSION: 1. Mildly increased FDG uptake associated with the mixed lytic and sclerotic lesion involving the L4 vertebral body. The SUV max is equal to 4.4. This remains indeterminate. Differential considerations include slow growing indolent neoplastic process versus atypical presentation of a benign etiology such as vertebral hemangioma. Tissue sampling may be necessary for definitive diagnosis. No additional bone lesions are identified. 2. Similar appearance of multiple small anterior mediastinal lymph nodes with surrounding soft tissue stranding. Unchanged since 08/14/2014. The CT appearance favors benign thymic hyperplasia. Neoplastic process is considered less favored. On the corresponding CT images there is mild increased tracer uptake within SUV max of 5.05, which is above blood pool activity. 3.  Aortic Atherosclerosis (ICD10-I70.0). 4. Hepatic steatosis. Electronically Signed   By: Kerby Moors M.D.   On: 06/10/2019 15:23    Labs:  CBC: Recent Labs    05/28/19 1504  WBC 3.7*  HGB 13.3  HCT 40.7  PLT 273    COAGS: No  results for input(s): INR, APTT in the last 8760 hours.  BMP: Recent Labs    05/28/19 1504  NA 140  K 3.8  CL 105  CO2 27  GLUCOSE 93  BUN 12  CALCIUM 9.3  CREATININE 0.77  GFRNONAA >60  GFRAA >60    LIVER FUNCTION TESTS: Recent Labs    05/28/19 1504  BILITOT 0.6  AST 33  ALT 49*  ALKPHOS 74  PROT 8.5*  ALBUMIN 4.7    TUMOR MARKERS: No results for input(s): AFPTM, CEA, CA199, CHROMGRNA in the last 8760 hours.  Assessment and Plan: Patient with past medical  history of IgG kappa monoclonal gammopathy presents with complaint of bone lesion.  IR consulted for bone lesion biopsy at the request of Dr. Delton Coombes. Case reviewed by Dr. Vernard Gambles who approves patient for procedure.  Patient presents today for procedure.  She does have shingles which are actively being treated and improving. No visible rash under the breast.  No open or draining lesions.  No pain, tingling, numbness, or rash on the back.  She has been NPO and is not currently on blood thinners.   Risks and benefits of biopsy was discussed with the patient and/or patient's family including, but not limited to bleeding, infection, damage to adjacent structures or low yield requiring additional tests.  All of the questions were answered and there is agreement to proceed.  Consent signed and in chart.  Thank you for this interesting consult.  I greatly enjoyed meeting Hannah Cooper and look forward to participating in their care.  A copy of this report was sent to the requesting provider on this date.  Electronically Signed: Docia Barrier, PA 06/19/2019, 11:38 AM   I spent a total of  30 Minutes   in face to face in clinical consultation, greater than 50% of which was counseling/coordinating care for L3/L4 bone lesions

## 2019-06-21 ENCOUNTER — Encounter: Payer: Self-pay | Admitting: Cardiology

## 2019-06-21 ENCOUNTER — Telehealth (INDEPENDENT_AMBULATORY_CARE_PROVIDER_SITE_OTHER): Payer: No Typology Code available for payment source | Admitting: Cardiology

## 2019-06-21 VITALS — BP 188/109 | HR 55 | Ht 66.0 in | Wt 240.0 lb

## 2019-06-21 DIAGNOSIS — R002 Palpitations: Secondary | ICD-10-CM | POA: Diagnosis not present

## 2019-06-21 DIAGNOSIS — I5032 Chronic diastolic (congestive) heart failure: Secondary | ICD-10-CM

## 2019-06-21 DIAGNOSIS — R0789 Other chest pain: Secondary | ICD-10-CM

## 2019-06-21 DIAGNOSIS — I10 Essential (primary) hypertension: Secondary | ICD-10-CM

## 2019-06-21 MED ORDER — DILTIAZEM HCL 30 MG PO TABS: 30.0000 mg | ORAL_TABLET | Freq: Two times a day (BID) | ORAL | 11 refills | Status: DC

## 2019-06-21 NOTE — Progress Notes (Signed)
Virtual Visit via Telephone Note   This visit type was conducted due to national recommendations for restrictions regarding the COVID-19 Pandemic (e.g. social distancing) in an effort to limit this patient's exposure and mitigate transmission in our community.  Due to her co-morbid illnesses, this patient is at least at moderate risk for complications without adequate follow up.  This format is felt to be most appropriate for this patient at this time.  The patient did not have access to video technology/had technical difficulties with video requiring transitioning to audio format only (telephone).  All issues noted in this document were discussed and addressed.  No physical exam could be performed with this format.  Please refer to the patient's chart for her  consent to telehealth for [facility].   The patient was identified using 2 identifiers.  Date:  06/21/2019   ID:  Hannah Cooper, DOB 1955-10-31, MRN [id number]  Patient Location: Home Provider Location: Office  PCP:  Jake Samples, PA-C  Cardiologist:  Carlyle Dolly, MD  Electrophysiologist:  None   Evaluation Performed:  Follow-Up Visit  Chief Complaint:  Follow up  History of Present Illness:    Hannah Cooper is a 64 y.o. female seen today for follow up of the following medical problems.   1. Palpitations - 2018 monitor PAC and PVCs - has been on lopressor - ongoign palpitaitons. Occurs daily, typically lasts just a few seconds  2. Chest pain - long history of atypical chest pain - cath 2014 with patent coronaries. - admit 12/2014 with chest pain, negative evaluation for ACS.   - chronic symptoms unchanged since last visit   3. COPD - noted on 12/2013 PFTs - followed Dr Luan Pulling    4. HTN - she reports her insurance would not cover chlorthalidone, stopped some time ago. Has new insurance however if needs to restart.    5. Chronic diastolic heart failure - echo 06/2013 LVEF 55-60%,  abnormal diastolic function.  - some recent edema. Takes lasix about 2-3 times a week which helps  6. Dyspnea - had CPX 06/2014, showed mild to mod reduced functoinal capacity - symptoms remain variable   7. IgG kappa monoclonal gammopathy and bony lesion - both followed by heme/onc  SH: works at [employer] for kids. She has 3 adopted kids (9,10,11). Great grand baby any day now.    The patient does not have symptoms concerning for COVID-19 infection (fever, chills, cough, or new shortness of breath).    Past Medical History:  Diagnosis Date  . Arthritis   . Atypical chest pain    chronic  . Chronic back pain   . Chronic diastolic CHF (congestive heart failure) ([place])   . COPD (chronic obstructive pulmonary disease) ([place])   . DDD (degenerative disc disease), cervical   . Hyperlipidemia   . Hypertension   . Lumbar radiculopathy   . Meningitis   . Normal coronary arteries 2014  . Palpitations   . Premature atrial contractions   . PVC's (premature ventricular contractions)    Past Surgical History:  Procedure Laterality Date  . ABDOMINAL HYSTERECTOMY    . IR FLUORO GUIDED NEEDLE PLC ASPIRATION/INJECTION LOC  06/19/2019  . LEFT HEART CATHETERIZATION WITH CORONARY ANGIOGRAM N/A 09/05/2012   Procedure: LEFT HEART CATHETERIZATION WITH CORONARY ANGIOGRAM;  Surgeon: Wellington Hampshire, MD;  Location: [facility];  Service: Cardiovascular;  Laterality: N/A;  . THYROID SURGERY  2002     Current Meds  Medication Sig  .  acetaminophen (TYLENOL) 500 MG tablet Take 1,000 mg by mouth every 6 (six) hours as needed for headache (pain).   Marland Kitchen albuterol (PROVENTIL) (2.5 MG/3ML) 0.083% nebulizer solution Take 2.5 mg by nebulization every 6 (six) hours as needed for wheezing or shortness of breath.  . Albuterol Sulfate (PROAIR RESPICLICK) 123XX123 (90 Base) MCG/ACT AEPB Inhale 2 puffs into the lungs every 4 (four) hours as needed (shortness of breath/ wheezing).  Marland Kitchen aspirin EC 81 MG tablet  Take 81 mg by mouth daily.  . cycloSPORINE (RESTASIS) 0.05 % ophthalmic emulsion Place 1 drop into both eyes 2 (two) times daily.   . furosemide (LASIX) 20 MG tablet Take 1 tablet daily as needed may take 2 tablets prn (Patient taking differently: Take 20 mg by mouth 2 (two) times daily as needed (fluid retention.). )  . HYDROcodone-acetaminophen (NORCO) 10-325 MG tablet Take 1 tablet by mouth 2 (two) times daily as needed (pain.).  Marland Kitchen ibuprofen (ADVIL,MOTRIN) 800 MG tablet Take 1 tablet by mouth 2 (two) times daily as needed (back pain).   . isosorbide mononitrate (IMDUR) 30 MG 24 hr tablet TAKE 1 TABLET (30 MG TOTAL) BY MOUTH DAILY. (Patient taking differently: Take 30 mg by mouth daily. )  . metoprolol tartrate (LOPRESSOR) 100 MG tablet Take 100 mg by mouth 2 (two) times daily.  . Misc. Devices MISC SHOWER CHAIR  . Misc. Devices MISC ELEVATED TOILET CHAIR  . traMADol (ULTRAM) 50 MG tablet Take 1 tablet (50 mg total) by mouth every 6 (six) hours as needed. (Patient taking differently: Take 50 mg by mouth every 6 (six) hours as needed (for pain.). )  . valACYclovir (VALTREX) 1000 MG tablet Take 1 tablet (1,000 mg total) by mouth 3 (three) times daily.     Allergies:   Other   Social History   Tobacco Use  . Smoking status: Never Smoker  . Smokeless tobacco: Never Used  Substance Use Topics  . Alcohol use: No    Alcohol/week: 0.0 standard drinks  . Drug use: No     Family Hx: The patient's family history includes Heart attack (age of onset: 76) in her sister; Heart attack (age of onset: 59) in her mother.  ROS:   Please see the history of present illness.     All other systems reviewed and are negative.   Prior CV studies:   The following studies were reviewed today:  08/2012 cath Hemodynamics: AO: 164/82 mmHg LV: 169/14 mmHg LVEDP: 24 mmHg  Coronary angiography: Coronary dominance: Right   Left Main: Normal  Left Anterior Descending (LAD): Normal in size  with no significant disease.  1st diagonal (D1): Small in size with minor irregularities.  2nd diagonal (D2): Normal in size with no significant disease.  3rd diagonal (D3): Normal in size with no significant disease.  Circumflex (LCx): Normal in size and nondominant. The vessel has no significant disease.  1st obtuse marginal: Small in size with no significant disease.  2nd obtuse marginal: Small in size with no significant disease.  3rd obtuse marginal: Large in size with no significant disease.  Right Coronary Artery: Normal in size and dominant. The vessel has no significant disease.  posterior descending artery: Normal in size with no significant disease.  posterior lateral branchs: Normal in size with no significant disease.  Left ventriculography: Left ventricular systolic function is normal , LVEF is estimated at 60-65% %, there is no significant mitral regurgitation   Final Conclusions:  1.Normal coronary arteries. 2. Normal LV systolic function.  3. Moderately elevated left ventricular end-diastolic pressure likely due to diastolic heart failure.   06/2013 echo Study Conclusions  - Procedure narrative: Transthoracic echocardiography. Image quality was suboptimal. The study was technically difficult, as a result of poor sound wave transmission and restricted patient mobility. - Left ventricle: The cavity size was normal. Wall thickness was increased in a pattern of mild to moderate LVH. Diastolic dysfunction noted, grade indeterminant. Systolic function was normal. The estimated ejection fraction was in the range of 55% to 60%. Wall motion was normal; there were no regional wall motion abnormalities.   Jan 2017 Event monitor: no arrhythmias  06/2014 CPX Conclusion: Exercise testing with gas exchange demonstrates a mild-moderately reduced functional capacity when compared to matched sedentary norms. There was an in-adequate HR  response to the exercise response and flat O2 pulse. This high HR reserve was likely high due to pulmonary limitations based on restrictive spirometry and RR 45 at peak exercise and reaching maximum ventilatory limits. However, it could have easily been due to poor effort. The sub-maximal effort is clouding interpretation of the test. Nevertheless, correlation of resting spirometry test Showing restriction with outpatient pulmonary function test is warranted   05/2016 heart monitor  Telemetry tracings predominately show sinus rhythm  Reported symptoms correlate with occasional PVCs and PACs    02/2016 echo Study Conclusions  - Left ventricle: The cavity size was normal. Systolic function was vigorous. The estimated ejection fraction was in the range of 65% to 70%. Wall motion was normal; there were no regional wall motion abnormalities. There was an increased relative contribution of atrial contraction to ventricular filling. Doppler parameters are consistent with abnormal left ventricular relaxation (grade 1 diastolic dysfunction). - Pulmonic valve: There was trivial regurgitation. - Pulmonary arteries: Systolic pressure could not be accurately estimated.  Labs/Other Tests and Data Reviewed:    EKG:  No ECG reviewed.  Recent Labs: 05/28/2019: ALT 49 06/19/2019: BUN 10; Creatinine, Ser 0.72; Hemoglobin 13.4; Platelets 256; Potassium 4.1; Sodium 140   Recent Lipid Panel Lab Results  Component Value Date/Time   CHOL 123 03/14/2016 07:39 AM   TRIG 85 03/14/2016 07:39 AM   HDL 46 03/14/2016 07:39 AM   CHOLHDL 2.7 03/14/2016 07:39 AM   LDLCALC 60 03/14/2016 07:39 AM    Wt Readings from Last 3 Encounters:  06/21/19 240 lb (108.9 kg)  06/19/19 240 lb (108.9 kg)  06/10/19 246 lb (111.6 kg)     Objective:    Vital Signs:  BP (!) 188/109   Pulse (!) 55   Ht 5\' 6"  (1.676 m)   Wt 240 lb (108.9 kg)   BMI 38.74 kg/m    Normal affect. Normal speech  pattern and tone. Comfortable, no apparent distress. No audible signs of SOB or wheezing.   ASSESSMENT & PLAN:    1. Palpitations - ongoing symptoms on lopressor 100mg  bid. Prior monitor with just PACs and PVCs - try cardizem 30mg  bid. Low normal HRs now, will need to monitor, would accept anything 50 and higher if asymptomatic.     2. Chest pain - long history of atypical chest pain. Negative cath in 2014 - her chronic symptoms are unchanged, continue to monitor.     3. HTN - above goal, starting diltiazem for palpitations - may restart chlorthalidone pending response   4. Chronic diastolic HF - controlled, continue prn lasix.   COVID-19 Education: The signs and symptoms of COVID-19 were discussed with the patient and how to seek care for testing (follow  up with PCP or arrange E-visit).  The importance of social distancing was discussed today.  Time:   Today, I have spent 22 minutes with the patient with telehealth technology discussing the above problems.     Medication Adjustments/Labs and Tests Ordered: Current medicines are reviewed at length with the patient today.  Concerns regarding medicines are outlined above.   Tests Ordered: No orders of the defined types were placed in this encounter.   Medication Changes: No orders of the defined types were placed in this encounter.   Follow Up:  Virtual Visit  in 3 week(s)  Signed, Carlyle Dolly, MD  06/21/2019 12:30 PM    [facility]

## 2019-06-21 NOTE — Addendum Note (Signed)
Addended by: Levonne Hubert on: 06/21/2019 01:47 PM   Modules accepted: Orders

## 2019-06-21 NOTE — Patient Instructions (Signed)
Medication Instructions:  Your physician has recommended you make the following change in your medication:  Start Diltiazem 30 mg Two Times Daily   *If you need a refill on your cardiac medications before your next appointment, please call your pharmacy*   Lab Work: NONE  If you have labs (blood work) drawn today and your tests are completely normal, you will receive your results only by: Marland Kitchen MyChart Message (if you have MyChart) OR . A paper copy in the mail If you have any lab test that is abnormal or we need to change your treatment, we will call you to review the results.   Testing/Procedures: NONE    Follow-Up: At [facility], you and your health needs are our priority.  As part of our continuing mission to provide you with exceptional heart care, we have created designated Provider Care Teams.  These Care Teams include your primary Cardiologist (physician) and Advanced Practice Providers (APPs -  Physician Assistants and Nurse Practitioners) who all work together to provide you with the care you need, when you need it.  We recommend signing up for the patient portal called "MyChart".  Sign up information is provided on this After Visit Summary.  MyChart is used to connect with patients for Virtual Visits (Telemedicine).  Patients are able to view lab/test results, encounter notes, upcoming appointments, etc.  Non-urgent messages can be sent to your provider as well.   To learn more about what you can do with MyChart, go to [email or web address].    Your next appointment:   3 week(s)  The format for your next appointment:   Virtual Visit   Provider:   Carlyle Dolly, MD   Other Instructions Thank you for choosing [facility]!

## 2019-06-24 ENCOUNTER — Telehealth: Payer: Self-pay | Admitting: Cardiology

## 2019-06-24 LAB — SURGICAL PATHOLOGY

## 2019-06-24 MED ORDER — DILTIAZEM HCL 30 MG PO TABS: 30.0000 mg | ORAL_TABLET | Freq: Two times a day (BID) | ORAL | 11 refills | Status: DC

## 2019-06-24 NOTE — Telephone Encounter (Signed)
Rx sent to [facility] in [place]. 

## 2019-06-24 NOTE — Telephone Encounter (Signed)
Needing Rx's sent to [facility] not [facility]

## 2019-06-24 NOTE — Addendum Note (Signed)
Addended by: Levonne Hubert on: 06/24/2019 10:43 AM   Modules accepted: Orders

## 2019-06-25 ENCOUNTER — Encounter (HOSPITAL_COMMUNITY): Payer: Self-pay | Admitting: Hematology

## 2019-06-25 ENCOUNTER — Other Ambulatory Visit: Payer: Self-pay

## 2019-06-25 ENCOUNTER — Inpatient Hospital Stay (HOSPITAL_COMMUNITY): Payer: PRIVATE HEALTH INSURANCE | Attending: Hematology | Admitting: Hematology

## 2019-06-25 VITALS — BP 160/91 | HR 57 | Temp 96.9°F | Resp 16 | Wt 246.4 lb

## 2019-06-25 DIAGNOSIS — I110 Hypertensive heart disease with heart failure: Secondary | ICD-10-CM | POA: Insufficient documentation

## 2019-06-25 DIAGNOSIS — Z7982 Long term (current) use of aspirin: Secondary | ICD-10-CM | POA: Diagnosis not present

## 2019-06-25 DIAGNOSIS — M25511 Pain in right shoulder: Secondary | ICD-10-CM | POA: Insufficient documentation

## 2019-06-25 DIAGNOSIS — J449 Chronic obstructive pulmonary disease, unspecified: Secondary | ICD-10-CM | POA: Insufficient documentation

## 2019-06-25 DIAGNOSIS — M899 Disorder of bone, unspecified: Secondary | ICD-10-CM

## 2019-06-25 DIAGNOSIS — E785 Hyperlipidemia, unspecified: Secondary | ICD-10-CM | POA: Insufficient documentation

## 2019-06-25 DIAGNOSIS — I5032 Chronic diastolic (congestive) heart failure: Secondary | ICD-10-CM | POA: Diagnosis not present

## 2019-06-25 DIAGNOSIS — Z79899 Other long term (current) drug therapy: Secondary | ICD-10-CM | POA: Diagnosis not present

## 2019-06-25 DIAGNOSIS — M545 Low back pain: Secondary | ICD-10-CM | POA: Diagnosis not present

## 2019-06-25 DIAGNOSIS — Z90710 Acquired absence of both cervix and uterus: Secondary | ICD-10-CM | POA: Insufficient documentation

## 2019-06-25 DIAGNOSIS — Z8249 Family history of ischemic heart disease and other diseases of the circulatory system: Secondary | ICD-10-CM | POA: Diagnosis not present

## 2019-06-25 DIAGNOSIS — Z791 Long term (current) use of non-steroidal anti-inflammatories (NSAID): Secondary | ICD-10-CM | POA: Diagnosis not present

## 2019-06-25 DIAGNOSIS — D729 Disorder of white blood cells, unspecified: Secondary | ICD-10-CM

## 2019-06-25 DIAGNOSIS — M79601 Pain in right arm: Secondary | ICD-10-CM | POA: Insufficient documentation

## 2019-06-25 NOTE — Assessment & Plan Note (Signed)
1.  Plasmacytoma of L4 vertebral body: -MRI of the lumbar spine showed aggressive, contrast-enhancing L4 lesion with posterior cortical breakthrough with mild thecal sac narrowing.  No spinal canal or neural foraminal stenosis.  No other areas on the spine.  MRI from 2018 showed L4 lesion but was much smaller. -PET scan on 06/10/2019 reviewed by me showed mildly increased FDG uptake associated with mixed lytic and sclerotic lesion involving L4 vertebral body, SUV max 4.4.  No other active areas of uptake seen. -Repeat serum immunofixation shows IgG kappa monoclonal protein.  SPEP did not show any M spike.  Free kappa light chains are slightly elevated at 25.3.  Ratio is 1.12.  Lambda light chains are 21.9.  Beta-2 microglobulin is 2.2. -24-hour urine was negative for UPEP and immunofixation.  Protein was 130 mg. -L4 needle biopsy on 06/19/2019 showed minute segments of bone and soft tissue, limited cellularity.  IHC for CD138 highlights scattering plasma cells.  Cytokeratin is negative.  Few kappa positive plasma cells present.  Overall material is very limited and essentially nondiagnostic. -She likely has plasmacytoma because of prevalence of Kappa positive plasma cells in the L4 biopsy.  Repeat biopsy might not be very helpful because of the location. -I have recommended bone marrow biopsy to rule out marrow involvement.  If the marrow involvement is negative, she could be considered as having plasmacytoma. -She has a lot of pain in the lower back region which gets worse on minimal walking.  She will benefit from radiation therapy.  I will make a referral to radiation oncology. -I will see her back after the bone marrow biopsy.  2.  Low back pain: -She is taking half tablet of hydrocodone 10/325 every 6 hours as needed which is helping although causing minimal drowsiness. -She tried taking tramadol which did not help. -She also complained of right shoulder pain and right arm pain.  This is not new but  has gotten slightly worse.  Could be related to shoulder joint.  I have told her to try some NSAIDs.

## 2019-06-25 NOTE — Progress Notes (Signed)
 Hannah Cooper [address] [place], [place] [address]   CLINIC:  Medical Oncology/Hematology  PCP:  Hannah Cooper, Hannah J, PA-C [address] [place] [address] [phone]   REASON FOR VISIT:  Follow-up for L4 bone lesion.  CURRENT THERAPY: Under work-up.    INTERVAL HISTORY:  Hannah Cooper 64 y.o. female seen for follow-up of L4 bone lesion.  Reports low back pain rated as 12 out of 10.  This improves to 6 out of 10 upon taking half tablet of hydrocodone which is controlling her pain.  Appetite is 100%.  Energy levels are low.  Also reported right shoulder pain and right arm pain.  This has gotten slightly worse since the biopsy.    REVIEW OF SYSTEMS:  Review of Systems  Constitutional: Positive for fatigue.  Respiratory: Positive for shortness of breath.   Musculoskeletal: Positive for back pain.  All other systems reviewed and are negative.    PAST MEDICAL/SURGICAL HISTORY:  Past Medical History:  Diagnosis Date  . Arthritis   . Atypical chest pain    chronic  . Chronic back pain   . Chronic diastolic CHF (congestive heart failure) (HCC)   . COPD (chronic obstructive pulmonary disease) (HCC)   . DDD (degenerative disc disease), cervical   . Hyperlipidemia   . Hypertension   . Lumbar radiculopathy   . Meningitis   . Normal coronary arteries 2014  . Palpitations   . Premature atrial contractions   . PVC's (premature ventricular contractions)    Past Surgical History:  Procedure Laterality Date  . ABDOMINAL HYSTERECTOMY    . IR FLUORO GUIDED NEEDLE PLC ASPIRATION/INJECTION LOC  06/19/2019  . LEFT HEART CATHETERIZATION WITH CORONARY ANGIOGRAM N/A 09/05/2012   Procedure: LEFT HEART CATHETERIZATION WITH CORONARY ANGIOGRAM;  Surgeon: Muhammad A Arida, MD;  Location: [facility] CATH [facility];  Service: Cardiovascular;  Laterality: N/A;  . THYROID SURGERY  2002     SOCIAL HISTORY:  Social History   Socioeconomic History  . Marital status: Married   Spouse name: Not on file  . Number of children: Not on file  . Years of education: Not on file  . Highest education level: Not on file  Occupational History  . Not on file  Tobacco Use  . Smoking status: Never Smoker  . Smokeless tobacco: Never Used  Substance and Sexual Activity  . Alcohol use: No    Alcohol/week: 0.0 standard drinks  . Drug use: No  . Sexual activity: Yes    Partners: Male  Other Topics Concern  . Not on file  Social History Narrative  . Not on file   Social Determinants of Health   Financial Resource Strain: Low Risk   . Difficulty of Paying Living Expenses: Not hard at all  Food Insecurity: No Food Insecurity  . Worried About Running Out of Food in the Last Year: Never true  . Ran Out of Food in the Last Year: Never true  Transportation Needs: No Transportation Needs  . Lack of Transportation (Medical): No  . Lack of Transportation (Non-Medical): No  Physical Activity: Inactive  . Days of Exercise per Week: 0 days  . Minutes of Exercise per Session: 0 min  Stress: Stress Concern Present  . Feeling of Stress : To some extent  Social Connections: Not Isolated  . Frequency of Communication with Friends and Family: More than three times a week  . Frequency of Social Gatherings with Friends and Family: Never  . Attends Religious Services: More   than 4 times per year  . Active Member of Clubs or Organizations: Yes  . Attends Archivist Meetings: More than 4 times per year  . Marital Status: Married  Human resources officer Violence: Not At Risk  . Fear of Current or Ex-Partner: No  . Emotionally Abused: No  . Physically Abused: No  . Sexually Abused: No    FAMILY HISTORY:  Family History  Problem Relation Age of Onset  . Heart attack Mother 60  . Heart attack Sister 45    CURRENT MEDICATIONS:  Outpatient Encounter Medications as of 06/25/2019  Medication Sig  . acetaminophen (TYLENOL) 500 MG tablet Take 1,000 mg by mouth every 6 (six) hours as  needed for headache (pain).   Marland Kitchen albuterol (PROVENTIL) (2.5 MG/3ML) 0.083% nebulizer solution Take 2.5 mg by nebulization every 6 (six) hours as needed for wheezing or shortness of breath.  . Albuterol Sulfate (PROAIR RESPICLICK) 409 (90 Base) MCG/ACT AEPB Inhale 2 puffs into the lungs every 4 (four) hours as needed (shortness of breath/ wheezing).  Marland Kitchen aspirin EC 81 MG tablet Take 81 mg by mouth daily.  . cycloSPORINE (RESTASIS) 0.05 % ophthalmic emulsion Place 1 drop into both eyes 2 (two) times daily.   Marland Kitchen diltiazem (CARDIZEM) 30 MG tablet Take 1 tablet (30 mg total) by mouth 2 (two) times daily.  . furosemide (LASIX) 20 MG tablet Take 1 tablet daily as needed may take 2 tablets prn (Patient taking differently: Take 20 mg by mouth 2 (two) times daily as needed (fluid retention.). )  . HYDROcodone-acetaminophen (NORCO) 10-325 MG tablet Take 1 tablet by mouth 2 (two) times daily as needed (pain.).  Marland Kitchen ibuprofen (ADVIL,MOTRIN) 800 MG tablet Take 1 tablet by mouth 2 (two) times daily as needed (back pain).   . isosorbide mononitrate (IMDUR) 30 MG 24 hr tablet TAKE 1 TABLET (30 MG TOTAL) BY MOUTH DAILY. (Patient taking differently: Take 30 mg by mouth daily. )  . metoprolol tartrate (LOPRESSOR) 100 MG tablet Take 100 mg by mouth 2 (two) times daily.  . Misc. Devices MISC SHOWER CHAIR  . Misc. Devices MISC ELEVATED TOILET CHAIR  . valACYclovir (VALTREX) 1000 MG tablet Take 1 tablet (1,000 mg total) by mouth 3 (three) times daily.  . [DISCONTINUED] traMADol (ULTRAM) 50 MG tablet Take 1 tablet (50 mg total) by mouth every 6 (six) hours as needed. (Patient taking differently: Take 50 mg by mouth every 6 (six) hours as needed (for pain.). )   No facility-administered encounter medications on file as of 06/25/2019.    ALLERGIES:  Allergies  Allergen Reactions  . Other Swelling    [place] lipstick     PHYSICAL EXAM:  ECOG Performance status: 1  Vitals:   06/25/19 1400  BP: (!) 160/91  Pulse: (!) 57    Resp: 16  Temp: (!) 96.9 F (36.1 C)  SpO2: 98%   Filed Weights   06/25/19 1400  Weight: 246 lb 7 oz (111.8 kg)    Physical Exam Vitals reviewed.  Constitutional:      Appearance: Normal appearance.  Musculoskeletal:        General: No swelling.  Neurological:     General: No focal deficit present.     Mental Status: Hannah Cooper is alert and oriented to person, place, and time.  Psychiatric:        Mood and Affect: Mood normal.        Behavior: Behavior normal.      LABORATORY DATA:  I have reviewed  the labs as listed.  CBC    Component Value Date/Time   WBC 3.9 (L) 06/19/2019 1149   RBC 4.56 06/19/2019 1149   HGB 13.4 06/19/2019 1149   HCT 40.8 06/19/2019 1149   PLT 256 06/19/2019 1149   MCV 89.5 06/19/2019 1149   MCH 29.4 06/19/2019 1149   MCHC 32.8 06/19/2019 1149   RDW 13.8 06/19/2019 1149   LYMPHSABS 1.6 06/19/2019 1149   MONOABS 0.4 06/19/2019 1149   EOSABS 0.1 06/19/2019 1149   BASOSABS 0.0 06/19/2019 1149   CMP Latest Ref Rng & Units 06/19/2019 05/28/2019 10/17/2017  Glucose 70 - 99 mg/dL 107(H) 93 105(H)  BUN 8 - 23 mg/dL _0 Creatinine 0.44 - 1.00 mg/dL 0.72 0.77 0.73  Sodium 135 - 145 mmol/L 140 140 140  Potassium 3.5 - 5.1 mmol/L 4.1 3.8 3.9  Chloride 98 - 111 mmol/L 106 105 107  CO2 22 - 32 mmol/L _1 Calcium 8.9 - 10.3 mg/dL 9.4 9.3 8.9  Total Protein 6.5 - 8.1 g/dL - 8.5(H) -  Total Bilirubin 0.3 - 1.2 mg/dL - 0.6 -  Alkaline Phos 38 - 126 U/L - 74 -  AST 15 - 41 U/L - 33 -  ALT 0 - 44 U/L - 49(H) -       DIAGNOSTIC IMAGING:  I have independently reviewed the scans.     ASSESSMENT & PLAN:   Plasma cell disorder 1.  Plasmacytoma of L4 vertebral body: -MRI of the lumbar spine showed aggressive, contrast-enhancing L4 lesion with posterior cortical breakthrough with mild thecal sac narrowing.  No spinal canal or neural foraminal stenosis.  No other areas on the spine.  MRI from 2018 showed L4 lesion but was much smaller. -PET scan  on 06/10/2019 reviewed by me showed mildly increased FDG uptake associated with mixed lytic and sclerotic lesion involving L4 vertebral body, SUV max 4.4.  No other active areas of uptake seen. -Repeat serum immunofixation shows IgG kappa monoclonal protein.  SPEP did not show any M spike.  Free kappa light chains are slightly elevated at 25.3.  Ratio is 1.12.  Lambda light chains are 21.9.  Beta-2 microglobulin is 2.2. -24-hour urine was negative for UPEP and immunofixation.  Protein was 130 mg. -L4 needle biopsy on 06/19/2019 showed minute segments of bone and soft tissue, limited cellularity.  IHC for CD138 highlights scattering plasma cells.  Cytokeratin is negative.  Few kappa positive plasma cells present.  Overall material is very limited and essentially nondiagnostic. -Hannah Cooper likely has plasmacytoma because of prevalence of Kappa positive plasma cells in the L4 biopsy.  Repeat biopsy might not be very helpful because of the location. -I have recommended bone marrow biopsy to rule out marrow involvement.  If the marrow involvement is negative, Hannah Cooper could be considered as having plasmacytoma. -Hannah Cooper has a lot of pain in the lower back region which gets worse on minimal walking.  Hannah Cooper will benefit from radiation therapy.  I will make a referral to radiation oncology. -I will see her back after the bone marrow biopsy.  2.  Low back pain: -Hannah Cooper is taking half tablet of hydrocodone 10/325 every 6 hours as needed which is helping although causing minimal drowsiness. -Hannah Cooper tried taking tramadol which did not help. -Hannah Cooper also complained of right shoulder pain and right arm pain.  This is not new but has gotten slightly worse.  Could be related to shoulder joint.  I have told her to try some NSAIDs.  Orders placed this encounter:  Orders Placed This Encounter  Procedures  . CT Biopsy  . CT BONE MARROW BIOPSY & ASPIRATION   Total time spent is 30 minutes with more than 70% of the time spent face-to-face  discussing scan results, biopsy results, counseling and coordination of care.   Sreedhar Katragadda, MD [facility] [phone]  

## 2019-06-25 NOTE — Patient Instructions (Addendum)
[place] at [facility] Discharge Instructions  You were seen today by Dr. Delton Coombes. He went over your recent lab and biopsy results. He recommends that you have a bone marrow biopsy to further evaluate your condition. He will refer you to [facility] for radiation. He will see you back after your biopsy for follow up.   Thank you for choosing [facility] at [facility] to provide your oncology and hematology care.  To afford each patient quality time with our provider, please arrive at least 15 minutes before your scheduled appointment time.   If you have a lab appointment with the [facility] please come in thru the  Main Entrance and check in at the main information desk  You need to re-schedule your appointment should you arrive 10 or more minutes late.  We strive to give you quality time with our providers, and arriving late affects you and other patients whose appointments are after yours.  Also, if you no show three or more times for appointments you may be dismissed from the clinic at the providers discretion.     Again, thank you for choosing [facility].  Our hope is that these requests will decrease the amount of time that you wait before being seen by our physicians.       _____________________________________________________________  Should you have questions after your visit to [facility], please contact our office at [phone] between the hours of 8:00 a.m. and 4:30 p.m.  Voicemails left after 4:00 p.m. will not be returned until the following business day.  For prescription refill requests, have your pharmacy contact our office and allow 72 hours.    Cancer Center Support Programs:   > Cancer Support Group  2nd Tuesday of the month 1pm-2pm, Journey Room

## 2019-07-04 ENCOUNTER — Other Ambulatory Visit: Payer: Self-pay | Admitting: Radiology

## 2019-07-08 ENCOUNTER — Ambulatory Visit (HOSPITAL_COMMUNITY)
Admission: RE | Admit: 2019-07-08 | Discharge: 2019-07-08 | Disposition: A | Discharge status: Home / Self Care | Payer: PRIVATE HEALTH INSURANCE | Source: Ambulatory Visit | Attending: Hematology | Admitting: Hematology

## 2019-07-08 ENCOUNTER — Encounter (HOSPITAL_COMMUNITY): Payer: Self-pay

## 2019-07-08 ENCOUNTER — Other Ambulatory Visit: Payer: Self-pay

## 2019-07-08 DIAGNOSIS — I5032 Chronic diastolic (congestive) heart failure: Secondary | ICD-10-CM | POA: Diagnosis not present

## 2019-07-08 DIAGNOSIS — Z79899 Other long term (current) drug therapy: Secondary | ICD-10-CM | POA: Diagnosis not present

## 2019-07-08 DIAGNOSIS — E785 Hyperlipidemia, unspecified: Secondary | ICD-10-CM | POA: Diagnosis not present

## 2019-07-08 DIAGNOSIS — I110 Hypertensive heart disease with heart failure: Secondary | ICD-10-CM | POA: Diagnosis not present

## 2019-07-08 DIAGNOSIS — Z7982 Long term (current) use of aspirin: Secondary | ICD-10-CM | POA: Diagnosis not present

## 2019-07-08 DIAGNOSIS — M1990 Unspecified osteoarthritis, unspecified site: Secondary | ICD-10-CM | POA: Diagnosis not present

## 2019-07-08 DIAGNOSIS — D72822 Plasmacytosis: Secondary | ICD-10-CM | POA: Diagnosis not present

## 2019-07-08 DIAGNOSIS — J449 Chronic obstructive pulmonary disease, unspecified: Secondary | ICD-10-CM | POA: Diagnosis not present

## 2019-07-08 DIAGNOSIS — M549 Dorsalgia, unspecified: Secondary | ICD-10-CM | POA: Diagnosis not present

## 2019-07-08 DIAGNOSIS — M899 Disorder of bone, unspecified: Secondary | ICD-10-CM | POA: Diagnosis present

## 2019-07-08 DIAGNOSIS — D72819 Decreased white blood cell count, unspecified: Secondary | ICD-10-CM | POA: Insufficient documentation

## 2019-07-08 LAB — CBC WITH DIFFERENTIAL/PLATELET
Abs Immature Granulocytes: 0.01 10*3/uL (ref 0.00–0.07)
Basophils Absolute: 0 10*3/uL (ref 0.0–0.1)
Basophils Relative: 1 %
Eosinophils Absolute: 0.1 10*3/uL (ref 0.0–0.5)
Eosinophils Relative: 3 %
HCT: 39.9 % (ref 36.0–46.0)
Hemoglobin: 13 g/dL (ref 12.0–15.0)
Immature Granulocytes: 0 %
Lymphocytes Relative: 42 %
Lymphs Abs: 1.7 10*3/uL (ref 0.7–4.0)
MCH: 29.7 pg (ref 26.0–34.0)
MCHC: 32.6 g/dL (ref 30.0–36.0)
MCV: 91.1 fL (ref 80.0–100.0)
Monocytes Absolute: 0.5 10*3/uL (ref 0.1–1.0)
Monocytes Relative: 13 %
Neutro Abs: 1.6 10*3/uL — ABNORMAL LOW (ref 1.7–7.7)
Neutrophils Relative %: 41 %
Platelets: 245 10*3/uL (ref 150–400)
RBC: 4.38 MIL/uL (ref 3.87–5.11)
RDW: 14.2 % (ref 11.5–15.5)
WBC: 3.9 10*3/uL — ABNORMAL LOW (ref 4.0–10.5)
nRBC: 0 % (ref 0.0–0.2)

## 2019-07-08 LAB — PROTIME-INR
INR: 1 (ref 0.8–1.2)
Prothrombin Time: 13 seconds (ref 11.4–15.2)

## 2019-07-08 MED ORDER — FENTANYL CITRATE (PF) 100 MCG/2ML IJ SOLN
INTRAMUSCULAR | Status: AC
  Filled 2019-07-08: qty 2

## 2019-07-08 MED ORDER — MIDAZOLAM HCL 2 MG/2ML IJ SOLN
INTRAMUSCULAR | Status: AC | PRN
  Administered 2019-07-08 (×3): 1 mg via INTRAVENOUS

## 2019-07-08 MED ORDER — SODIUM CHLORIDE 0.9 % IV SOLN: INTRAVENOUS | Status: DC

## 2019-07-08 MED ORDER — LIDOCAINE HCL (PF) 1 % IJ SOLN
INTRAMUSCULAR | Status: AC | PRN
  Administered 2019-07-08: 15 mL

## 2019-07-08 MED ORDER — MIDAZOLAM HCL 2 MG/2ML IJ SOLN
INTRAMUSCULAR | Status: AC
  Filled 2019-07-08: qty 4

## 2019-07-08 MED ORDER — FENTANYL CITRATE (PF) 100 MCG/2ML IJ SOLN
INTRAMUSCULAR | Status: AC | PRN
  Administered 2019-07-08 (×2): 50 ug via INTRAVENOUS

## 2019-07-08 NOTE — Progress Notes (Signed)
Hannah Cooper was seen and treated in our [employer] stay department on 07/08/2019. Hannah Cooper will be needed to care for his wife for 24 hours following the procedure.

## 2019-07-08 NOTE — H&P (Signed)
Referring Physician(s): Katragadda,Sreedhar  Supervising Physician: Markus Daft  Patient Status:  [facility] OP  Chief Complaint:  "I'm having a bone marrow biopsy"  Subjective: Patient familiar to IR service from PICC placement in 2003 and L4 lesion biopsy on 06/19/2019 which yielded limited material/nondiagnostic specimen.  She has a history of IgG kappa monoclonal gammopathy and presents again today for CT-guided bone marrow biopsy for further evaluation/rule out myeloma/plasmacytoma.  She currently denies fever, headache, chest pain, dyspnea, cough, abdominal pain, nausea, vomiting or bleeding.  She does have intermittent back pain.  Past Medical History:  Diagnosis Date  . Arthritis   . Atypical chest pain    chronic  . Chronic back pain   . Chronic diastolic CHF (congestive heart failure) ([place])   . COPD (chronic obstructive pulmonary disease) ([place])   . DDD (degenerative disc disease), cervical   . Hyperlipidemia   . Hypertension   . Lumbar radiculopathy   . Meningitis   . Normal coronary arteries 2014  . Palpitations   . Premature atrial contractions   . PVC's (premature ventricular contractions)    Past Surgical History:  Procedure Laterality Date  . ABDOMINAL HYSTERECTOMY    . IR FLUORO GUIDED NEEDLE PLC ASPIRATION/INJECTION LOC  06/19/2019  . LEFT HEART CATHETERIZATION WITH CORONARY ANGIOGRAM N/A 09/05/2012   Procedure: LEFT HEART CATHETERIZATION WITH CORONARY ANGIOGRAM;  Surgeon: Wellington Hampshire, MD;  Location: [facility] CATH LAB;  Service: Cardiovascular;  Laterality: N/A;  . THYROID SURGERY  2002       Allergies: Other  Medications: Prior to Admission medications   Medication Sig Start Date End Date Taking? Authorizing Provider  acetaminophen (TYLENOL) 500 MG tablet Take 1,000 mg by mouth every 6 (six) hours as needed for headache (pain).     [provider]  albuterol (PROVENTIL) (2.5 MG/3ML) 0.083% nebulizer solution Take 2.5 mg by nebulization every 6  (six) hours as needed for wheezing or shortness of breath.    [provider]  Albuterol Sulfate (PROAIR RESPICLICK) 416 (90 Base) MCG/ACT AEPB Inhale 2 puffs into the lungs every 4 (four) hours as needed (shortness of breath/ wheezing).    [provider]  aspirin EC 81 MG tablet Take 81 mg by mouth daily.    [provider]  cycloSPORINE (RESTASIS) 0.05 % ophthalmic emulsion Place 1 drop into both eyes 2 (two) times daily.     [provider]  diltiazem (CARDIZEM) 30 MG tablet Take 1 tablet (30 mg total) by mouth 2 (two) times daily. 06/24/19   Arnoldo Lenis, MD  furosemide (LASIX) 20 MG tablet Take 1 tablet daily as needed may take 2 tablets prn Patient taking differently: Take 20 mg by mouth 2 (two) times daily as needed (fluid retention.).  11/10/16   Arnoldo Lenis, MD  HYDROcodone-acetaminophen (NORCO) 10-325 MG tablet Take 1 tablet by mouth 2 (two) times daily as needed (pain.).    [provider]  ibuprofen (ADVIL,MOTRIN) 800 MG tablet Take 1 tablet by mouth 2 (two) times daily as needed (back pain).  08/01/14   [provider]  isosorbide mononitrate (IMDUR) 30 MG 24 hr tablet TAKE 1 TABLET (30 MG TOTAL) BY MOUTH DAILY. Patient taking differently: Take 30 mg by mouth daily.  04/19/17   Satira Sark, MD  metoprolol tartrate (LOPRESSOR) 100 MG tablet Take 100 mg by mouth 2 (two) times daily.    [provider]  Misc. Devices MISC SHOWER CHAIR 06/14/19   Francene Finders  L, NP-C  Misc. Devices MISC ELEVATED TOILET CHAIR 06/14/19   Lockamy, Randi L, NP-C  valACYclovir (VALTREX) 1000 MG tablet Take 1 tablet (1,000 mg total) by mouth 3 (three) times daily. 06/17/19   Derek Jack, MD     Vital Signs: Blood pressure 168/107, temperature 98.8, heart rate 75, respirations 16, O2 sat 100% room air   Physical Exam awake, alert.  Chest clear to auscultation bilaterally.  Heart with regular rate and rhythm.  Abdomen obese,  soft, positive bowel sounds, nontender.  Bilateral lower extremity edema noted.  Imaging: No results found.  Labs:  CBC: Recent Labs    05/28/19 1504 06/19/19 1149  WBC 3.7* 3.9*  HGB 13.3 13.4  HCT 40.7 40.8  PLT 273 256    COAGS: Recent Labs    06/19/19 1149  INR 1.0    BMP: Recent Labs    05/28/19 1504 06/19/19 1149  NA 140 140  K 3.8 4.1  CL 105 106  CO2 27 24  GLUCOSE 93 107*  BUN 12 10  CALCIUM 9.3 9.4  CREATININE 0.77 0.72  GFRNONAA >60 >60  GFRAA >60 >60    LIVER FUNCTION TESTS: Recent Labs    05/28/19 1504  BILITOT 0.6  AST 33  ALT 49*  ALKPHOS 74  PROT 8.5*  ALBUMIN 4.7    Assessment and Plan: Pt with history of IgG kappa monoclonal gammopathy, s/p L4 lesion biopsy on 06/19/2019 which yielded limited material/nondiagnostic specimen ; presents again today for CT-guided bone marrow biopsy for further evaluation/rule out myeloma/plasmacytoma.  Risks and benefits of procedure was discussed with the patient  including, but not limited to bleeding, infection, damage to adjacent structures or low yield requiring additional tests.  All of the questions were answered and there is agreement to proceed.  Consent signed and in chart.     Electronically Signed: D. Rowe Robert, PA-C 07/08/2019, 10:22 AM   I spent a total of 20 minutes at the the patient's bedside AND on the patient's hospital floor or unit, greater than 50% of which was counseling/coordinating care for CT-guided bone marrow biopsy

## 2019-07-08 NOTE — Procedures (Signed)
Interventional Radiology Procedure:   Indications: L4 bone lesion, concerning for plasmocytoma.  Procedure: CT guided bone marrow biopsy  Findings: 2 aspirates and 1 core from right ilium  Complications: None     EBL: Minimal, less than 10 ml  Plan: Discharge to home in one hour.   Hollyann Pablo R. Anselm Pancoast, MD  Pager: [phone]

## 2019-07-08 NOTE — Discharge Instructions (Signed)
For any questions or concerns call [phone]; for after hours call [phone] and ask for on call MD ° °Bone Marrow Aspiration and Bone Marrow Biopsy, Adult, Care After °This sheet gives you information about how to care for yourself after your procedure. Your health care provider may also give you more specific instructions. If you have problems or questions, contact your health care provider. °What can I expect after the procedure? °After the procedure, it is common to have: °· Mild pain and tenderness. °· Swelling. °· Bruising. °Follow these instructions at home: °Puncture site care ° °· Follow instructions from your health care provider about how to take care of the puncture site. Make sure you: °? Wash your hands with soap and water before and after you change your bandage (dressing). If soap and water are not available, use hand sanitizer. °? Change your dressing as told by your health care provider. °· Check your puncture site every day for signs of infection. Check for: °? More redness, swelling, or pain. °? Fluid or blood. °? Warmth. °? Pus or a bad smell. °Activity °· Return to your normal activities as told by your health care provider. Ask your health care provider what activities are safe for you. °· Do not lift anything that is heavier than 10 lb (4.5 kg), or the limit that you are told, until your health care provider says that it is safe. °· Do not drive for 24 hours if you were given a sedative during your procedure. °General instructions ° °· Take over-the-counter and prescription medicines only as told by your health care provider. °· Do not take baths, swim, or use a hot tub until your health care provider approves. Ask your health care provider if you may take showers. You may only be allowed to take sponge baths. °· If directed, put ice on the affected area. To do this: °? Put ice in a plastic bag. °? Place a towel between your skin and the bag. °? Leave the ice on for 20 minutes, 2-3  times a day. °· Keep all follow-up visits as told by your health care provider. This is important. °Contact a health care provider if: °· Your pain is not controlled with medicine. °· You have a fever. °· You have more redness, swelling, or pain around the puncture site. °· You have fluid or blood coming from the puncture site. °· Your puncture site feels warm to the touch. °· You have pus or a bad smell coming from the puncture site. °Summary °· After the procedure, it is common to have mild pain, tenderness, swelling, and bruising. °· Follow instructions from your health care provider about how to take care of the puncture site and what activities are safe for you. °· Take over-the-counter and prescription medicines only as told by your health care provider. °· Contact a health care provider if you have any signs of infection, such as fluid or blood coming from the puncture site. °This information is not intended to replace advice given to you by your health care provider. Make sure you discuss any questions you have with your health care provider. °Document Revised: 08/21/2018 Document Reviewed: 08/21/2018 °Elsevier Patient Education © 2020 [employer] °Moderate Conscious Sedation, Adult, Care After °These instructions provide you with information about caring for yourself after your procedure. Your health care provider may also give you more specific instructions. Your treatment has been planned according to current medical practices, but problems sometimes occur. Call your health care provider if   you have any problems or questions after your procedure. °What can I expect after the procedure? °After your procedure, it is common: °· To feel sleepy for several hours. °· To feel clumsy and have poor balance for several hours. °· To have poor judgment for several hours. °· To vomit if you eat too soon. °Follow these instructions at home: °For at least 24 hours after the procedure: ° °· Do not: °? Participate in  activities where you could fall or become injured. °? Drive. °? Use heavy machinery. °? Drink alcohol. °? Take sleeping pills or medicines that cause drowsiness. °? Make important decisions or sign legal documents. °? Take care of children on your own. °· Rest. °Eating and drinking °· Follow the diet recommended by your health care provider. °· If you vomit: °? Drink water, juice, or soup when you can drink without vomiting. °? Make sure you have little or no nausea before eating solid foods. °General instructions °· Have a responsible adult stay with you until you are awake and alert. °· Take over-the-counter and prescription medicines only as told by your health care provider. °· If you smoke, do not smoke without supervision. °· Keep all follow-up visits as told by your health care provider. This is important. °Contact a health care provider if: °· You keep feeling nauseous or you keep vomiting. °· You feel light-headed. °· You develop a rash. °· You have a fever. °Get help right away if: °· You have trouble breathing. °This information is not intended to replace advice given to you by your health care provider. Make sure you discuss any questions you have with your health care provider. °Document Revised: 03/17/2017 Document Reviewed: 07/25/2015 °Elsevier Patient Education © 2020 [employer]vier Inc. ° °

## 2019-07-09 NOTE — Progress Notes (Signed)
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  Plasmocytoma of L4 vertebral body  HISTORY OF PRESENT ILLNESS::Hannah Cooper is a 64 y.o. female who is accompanied by no one due to COVID-19 restrictions. The patient presented to the ED on 03/13/2016 with multiple complaints including low back pain. X-ray of lumbar spine at that time showed mild degenerative facet joint change at L4-5 and L5-S1 without another other abnormalities.   MRI of lumbar spine was performed on 06/09/2016 secondary to persistent low-back pain. Results showed an 11 mm lesion in the L4 body that was considered to likely be benign (possibly atypical Schmorl's node) but was indeterminate.   MRI of lumbar spine on 05/23/2019 showed interval progression in the large L4 vertebral body osseous lesion with aggressive features. It was now showing cortical breakthrough and causing mild-to-moderate central canal narrowing. There was also significant surrounding marrow edema which extended into the bilateral pedicles. Given the interval progression, it was likely to represent osseous primary neoplasm.  Most recent MRI of lumbar spine on 06/07/2019 showed unchanged aggressive, contrast-enhancing L4 lesion with posterior cortical breakthrough and mild thecal sac narrowing. No spinal canal or neural foraminal stenosis otherwise.  PET scan on 06/10/2019 showed mildly increased FDG uptake associated with the mixed lytic and sclerotic lesion involving the L4 vertebral body, which remained indeterminate. Differential considerations included slow-growing indolent neoplastic process versus atypical  presentation of a benign etiology such as vertebral hemangioma. No additional bone lesions were identified. Finally, there was similar appearance of the multiple small anterior mediastinal lymph nodes with surrounding soft tissue stranding favoring benign thymic hyperplasia.  The patient underwent a transpedicular bx of L4 lesion via left transpedicular approach on 06/19/2019 performed by Dr. Laurence Ferrari. Pathology from the procedure showed minute fragments of bone and soft tissue with very limited cellularity.    The patient has been followed by Dr. Delton Coombes, whom she last saw on 06/25/2019. They discussed a bone marrow biopsy to rule out marrow involvement.  CT-guided bone marrow biopsy was performed on 07/08/2019. Results pending.  PREVIOUS RADIATION THERAPY: No  PAST MEDICAL HISTORY:  Past Medical History:  Diagnosis Date   Arthritis    Atypical chest pain    chronic   Chronic back pain    Chronic diastolic CHF (congestive heart failure) (HCC)    COPD (chronic obstructive pulmonary disease) (HCC)    DDD (degenerative disc disease), cervical    Hyperlipidemia    Hypertension    Lumbar radiculopathy    Meningitis    Normal coronary arteries 2014   Palpitations    Premature atrial contractions    PVC's (premature ventricular contractions)     PAST SURGICAL HISTORY: Past Surgical History:  Procedure Laterality Date   ABDOMINAL HYSTERECTOMY     IR FLUORO GUIDED NEEDLE PLC ASPIRATION/INJECTION LOC  06/19/2019   LEFT HEART CATHETERIZATION WITH CORONARY ANGIOGRAM N/A 09/05/2012   Procedure: LEFT HEART CATHETERIZATION WITH CORONARY ANGIOGRAM;  Surgeon: Wellington Hampshire, MD;  Location: [facility];  Service: Cardiovascular;  Laterality: N/A;   THYROID SURGERY  2002    FAMILY HISTORY:  Family History  Problem Relation Age of Onset   Heart attack Mother 23   Heart attack Sister  10    SOCIAL HISTORY:  Social History   Tobacco Use   Smoking status: Never  Smoker   Smokeless tobacco: Never Used  Substance Use Topics   Alcohol use: No    Alcohol/week: 0.0 standard drinks   Drug use: No    ALLERGIES:  Allergies  Allergen Reactions   Other Swelling    [place] lipstick    MEDICATIONS:  Current Outpatient Medications  Medication Sig Dispense Refill   acetaminophen (TYLENOL) 500 MG tablet Take 1,000 mg by mouth every 6 (six) hours as needed for headache (pain).      albuterol (PROVENTIL) (2.5 MG/3ML) 0.083% nebulizer solution Take 2.5 mg by nebulization every 6 (six) hours as needed for wheezing or shortness of breath.     Albuterol Sulfate (PROAIR RESPICLICK) 737 (90 Base) MCG/ACT AEPB Inhale 2 puffs into the lungs every 4 (four) hours as needed (shortness of breath/ wheezing).     aspirin EC 81 MG tablet Take 81 mg by mouth daily.     cycloSPORINE (RESTASIS) 0.05 % ophthalmic emulsion Place 1 drop into both eyes 2 (two) times daily.      diltiazem (CARDIZEM) 30 MG tablet Take 1 tablet (30 mg total) by mouth 2 (two) times daily. 60 tablet 11   furosemide (LASIX) 20 MG tablet Take 1 tablet daily as needed may take 2 tablets prn (Patient taking differently: Take 20 mg by mouth 2 (two) times daily as needed (fluid retention.). ) 60 tablet 1   HYDROcodone-acetaminophen (NORCO) 10-325 MG tablet Take 1 tablet by mouth 2 (two) times daily as needed (pain.).     ibuprofen (ADVIL,MOTRIN) 800 MG tablet Take 1 tablet by mouth 2 (two) times daily as needed (back pain).   2   isosorbide mononitrate (IMDUR) 30 MG 24 hr tablet TAKE 1 TABLET (30 MG TOTAL) BY MOUTH DAILY. (Patient taking differently: Take 30 mg by mouth daily. ) 90 tablet 0   metoprolol tartrate (LOPRESSOR) 100 MG tablet Take 100 mg by mouth 2 (two) times daily.     Misc. Devices MISC SHOWER CHAIR 1 Device 0   Misc. Devices MISC ELEVATED TOILET CHAIR 1 Device 0   valACYclovir (VALTREX) 1000 MG tablet Take 1 tablet (1,000 mg total) by mouth 3 (three) times daily. 21 tablet 0    No current facility-administered medications for this encounter.    REVIEW OF SYSTEMS:  A 10+ POINT REVIEW OF SYSTEMS WAS OBTAINED including neurology, dermatology, psychiatry, cardiac, respiratory, lymph, extremities, GI, GU, musculoskeletal, constitutional, reproductive, HEENT.  She denies any numbness or tingling in her lower extremities.  She denies any bowel or bladder incontinence.  She has had some constipation related to her narcotic pain medication.  Denies any other areas of pain.  She has limited range of movement in her right arm possibility of rotator cuff issue but this has not been formally evaluated.  Ambulates around the house with the assistance of a walker   PHYSICAL EXAM:  height is 5' 6"  (1.676 m) and weight is 248 lb 6 oz (112.7 kg). Her temporal temperature is 98.3 F (36.8 C). Her blood pressure is 172/84 (abnormal) and her pulse is 59 (abnormal). Her respiration is 18.   General: Alert and oriented, in no acute distress, sitting comfortably in wheelchair.  The patient is a fall risk in light of her back issues issues HEENT: Head is normocephalic. Extraocular movements are intact.  Neck: Neck is supple, no palpable cervical or supraclavicular lymphadenopathy. Heart: Regular in rate and  rhythm with no murmurs, rubs, or gallops. Chest: Clear to auscultation bilaterally, with no rhonchi, wheezes, or rales. Abdomen: Soft, nontender, nondistended, with no rigidity or guarding. Extremities: No cyanosis or edema. Lymphatics: see Neck Exam Skin: No concerning lesions. Musculoskeletal: symmetric strength and muscle tone throughout. Neurologic: Cranial nerves II through XII are grossly intact. No obvious focalities. Speech is fluent. Coordination is intact. Psychiatric: Judgment and insight are intact. Affect is appropriate.   ECOG = 2  0 - Asymptomatic (Fully active, able to carry on all predisease activities without restriction)  1 - Symptomatic but completely ambulatory  (Restricted in physically strenuous activity but ambulatory and able to carry out work of a light or sedentary nature. For example, light housework, office work)  2 - Symptomatic, <50% in bed during the day (Ambulatory and capable of all self care but unable to carry out any work activities. Up and about more than 50% of waking hours)  3 - Symptomatic, >50% in bed, but not bedbound (Capable of only limited self-care, confined to bed or chair 50% or more of waking hours)  4 - Bedbound (Completely disabled. Cannot carry on any self-care. Totally confined to bed or chair)  5 - Death   Eustace Pen MM, Creech RH, Tormey DC, et al. [id number]). "Toxicity and response criteria of the [facility]". [place] Oncol. 5 (6): 649-55  LABORATORY DATA:  Lab Results  Component Value Date   WBC 3.9 (L) 07/08/2019   HGB 13.0 07/08/2019   HCT 39.9 07/08/2019   MCV 91.1 07/08/2019   PLT 245 07/08/2019   NEUTROABS 1.6 (L) 07/08/2019   Lab Results  Component Value Date   NA 140 06/19/2019   K 4.1 06/19/2019   CL 106 06/19/2019   CO2 24 06/19/2019   GLUCOSE 107 (H) 06/19/2019   CREATININE 0.72 06/19/2019   CALCIUM 9.4 06/19/2019      RADIOGRAPHY: NM PET Image Initial (PI) Skull Base To Thigh  Result Date: 06/10/2019 CLINICAL DATA:  Initial.  Treatment strategy for spine lesion. EXAM: NUCLEAR MEDICINE PET SKULL BASE TO THIGH TECHNIQUE: 12.5 mCi F-18 FDG was injected intravenously. Full-ring PET imaging was performed from the skull base to thigh after the radiotracer. CT data was obtained and used for attenuation correction and anatomic localization. Fasting blood glucose: 109 mg/dl COMPARISON:  MRI 06/07/2019 FINDINGS: Mediastinal blood pool activity: SUV max 3.6 Liver activity: SUV max NA NECK: No hypermetabolic lymph nodes in the neck. Incidental CT findings: none CHEST: There is soft tissue stranding and multiple small anterior mediastinal lymph nodes are identified. Anatomically this  is unchanged from 08/14/2014. Corresponding SUV max is equal to 5.05. No pleural effusions. Lungs are clear. No suspicious pulmonary nodule or mass identified. Incidental CT findings: Mild aortic atherosclerosis. ABDOMEN/PELVIS: No abnormal hypermetabolic activity within the liver, pancreas, adrenal glands, or spleen. No hypermetabolic lymph nodes in the abdomen or pelvis. Incidental CT findings: Hepatic steatosis. SKELETON: Corresponding to the MRI abnormality L4 vertebral body is of mixed lytic and sclerotic lesion involving the L4 vertebral body. This measures 3.04 cm and has an SUV max of 4.4. Incidental CT findings: none IMPRESSION: 1. Mildly increased FDG uptake associated with the mixed lytic and sclerotic lesion involving the L4 vertebral body. The SUV max is equal to 4.4. This remains indeterminate. Differential considerations include slow growing indolent neoplastic process versus atypical presentation of a benign etiology such as vertebral hemangioma. Tissue sampling may be necessary for definitive diagnosis. No additional bone lesions are identified.  2. Similar appearance of multiple small anterior mediastinal lymph nodes with surrounding soft tissue stranding. Unchanged since 08/14/2014. The CT appearance favors benign thymic hyperplasia. Neoplastic process is considered less favored. On the corresponding CT images there is mild increased tracer uptake within SUV max of 5.05, which is above blood pool activity. 3.  Aortic Atherosclerosis (ICD10-I70.0). 4. Hepatic steatosis. Electronically Signed   By: Kerby Moors M.D.   On: 06/10/2019 15:23   IR Fluoro Guide Ndl Plmt / BX  Result Date: 06/19/2019 INDICATION: 64 year old female with a hypermetabolic mixed lytic and sclerotic lesion in the left aspect of the L4 vertebral body. She presents for transpedicular biopsy of the same. EXAM: Transpedicular biopsy of L4 vertebral body lesion MEDICATIONS: None. ANESTHESIA/SEDATION: Moderate (conscious)  sedation was employed during this procedure. A total of Versed 2 mg and Fentanyl 100 mcg was administered intravenously. Moderate Sedation Time: 30 minutes. The patient's level of consciousness and vital signs were monitored continuously by radiology nursing throughout the procedure under my direct supervision. FLUOROSCOPY TIME:  Fluoroscopy Time: 3 minutes 6 seconds (102 mGy). COMPLICATIONS: None immediate. PROCEDURE: Informed written consent was obtained from the patient after a thorough discussion of the procedural risks, benefits and alternatives. All questions were addressed. Maximal Sterile Barrier Technique was utilized including caps, mask, sterile gowns, sterile gloves, sterile drape, hand hygiene and skin antiseptic. A timeout was performed prior to the initiation of the procedure. The left pedicle at L4 was successfully localized. The skin was marked. Local anesthesia was attained by infiltration with 1% lidocaine. A small dermatotomy was made. Under intermittent fluoro guidance, an 11 gauge trocar needle was carefully advanced to the posterior aspect of the left pedicle. The trocar was then advanced through the pedicle and into the posterior aspect of the vertebral body. Core biopsies were then obtained of the left aspect of the L4 vertebral body. The biopsy specimens were placed in formalin and delivered to pathology for further analysis. Due to significant bleed back through the trocar needle, a small amount of Gel-Foam was injected into the posterior vertebral body. The trocar needle was then removed. IMPRESSION: Successful left transpedicular biopsy of L4. Electronically Signed   By: Jacqulynn Cadet M.D.   On: 06/19/2019 15:27   CT Biopsy  Result Date: 07/08/2019 INDICATION: 64 year old with a L4 lesion, suspicious for a plasmacytoma. Request for a CT-guided bone marrow biopsy. EXAM: CT GUIDED BONE MARROW ASPIRATES AND BIOPSY Physician: Stephan Minister. Anselm Pancoast, MD MEDICATIONS: None. ANESTHESIA/SEDATION:  Fentanyl 100 mcg IV; Versed 3.0 mg IV Moderate Sedation Time:  12 minutes The patient was continuously monitored during the procedure by the interventional radiology nurse under my direct supervision. COMPLICATIONS: None immediate. PROCEDURE: The procedure was explained to the patient. The risks and benefits of the procedure were discussed and the patient's questions were addressed. Informed consent was obtained from the patient. The patient was placed prone on CT table. Images of the pelvis were obtained. The right side of back was prepped and draped in sterile fashion. The skin and right posterior ilium were anesthetized with 1% lidocaine. 11 gauge bone needle was directed into the right ilium with CT guidance. Two aspirates and one core biopsy were obtained. Bandage placed over the puncture site. IMPRESSION: CT guided bone marrow aspiration and core biopsy. Electronically Signed   By: Markus Daft M.D.   On: 07/08/2019 12:45   CT BONE MARROW BIOPSY & ASPIRATION  Result Date: 07/08/2019 INDICATION: 64 year old with a L4 lesion, suspicious for a plasmacytoma. Request for  a CT-guided bone marrow biopsy. EXAM: CT GUIDED BONE MARROW ASPIRATES AND BIOPSY Physician: Stephan Minister. Anselm Pancoast, MD MEDICATIONS: None. ANESTHESIA/SEDATION: Fentanyl 100 mcg IV; Versed 3.0 mg IV Moderate Sedation Time:  12 minutes The patient was continuously monitored during the procedure by the interventional radiology nurse under my direct supervision. COMPLICATIONS: None immediate. PROCEDURE: The procedure was explained to the patient. The risks and benefits of the procedure were discussed and the patient's questions were addressed. Informed consent was obtained from the patient. The patient was placed prone on CT table. Images of the pelvis were obtained. The right side of back was prepped and draped in sterile fashion. The skin and right posterior ilium were anesthetized with 1% lidocaine. 11 gauge bone needle was directed into the right ilium with  CT guidance. Two aspirates and one core biopsy were obtained. Bandage placed over the puncture site. IMPRESSION: CT guided bone marrow aspiration and core biopsy. Electronically Signed   By: Markus Daft M.D.   On: 07/08/2019 12:45      IMPRESSION: Plasmocytoma of L4 vertebral body vs multiple myeloma.  Results of bone marrow aspiration biopsy are pending but likely solitary plasmacytoma.  Blood work is suggestive of plasmacytoma. biopsy of the L4 lesion showed scant plasma cells. Free kappa light chains are slightly elevated at 25.3. She likely has plasmacytoma because of prevalence of Kappa positive plasma cells in the L4 biopsy.  Patient will be a good candidate for definitive course of radiation therapy directed at her solitary lesion in the lower lumbar spine.  Recommendations are for approximately 5 and half weeks of radiation therapy.  If f aspiration biopsy shows myeloma then we will proceed with a higher dose per day and less number of fractions of treatment.  Today, I talked to the patient  about the findings and work-up thus far.  We discussed the natural history of plasmacytoma and general treatment, highlighting the role of radiotherapy in the management.  We discussed the available radiation techniques, and focused on the details of logistics and delivery.  We reviewed the anticipated acute and late sequelae associated with radiation in this setting.  The patient was encouraged to ask questions that I answered to the best of my ability.  A patient consent form was discussed and signed.  We retained a copy for our records.  The patient would like to proceed with radiation.  PLAN: Patient will proceed with CT simulation later this morning for treatments to begin March 30.  Anticipate 5-1/2 weeks of radiation therapy directed at the solitary lesion in the L4 region.    ------------------------------------------------  Blair Promise, PhD, MD  This document serves as a record of services  personally performed by Gery Pray, MD. It was created on his behalf by Clerance Lav, a trained medical scribe. The creation of this record is based on the scribe's personal observations and the provider's statements to them. This document has been checked and approved by the attending provider.

## 2019-07-09 NOTE — Progress Notes (Incomplete)
?  Radiation Oncology         [phone] ?________________________________ ? ?Name: RAMONDA FEDOROWICZ MRN: [id number]  ?Date: 07/10/2019  DOB: [birth year] ? ?SIMULATION AND TREATMENT PLANNING NOTE ? ?No diagnosis found. ? ?DIAGNOSIS: Plasmocytoma of L4 vertebral body ? ?NARRATIVE:  The patient was brought to the [facility].  Identity was confirmed.  All relevant records and images related to the planned course of therapy were reviewed.  The patient freely provided informed written consent to proceed with treatment after reviewing the details related to the planned course of therapy. The consent form was witnessed and verified by the simulation staff.  Then, the patient was set-up in a stable reproducible  {CHL RAD ONC PT SETUP:22732} position for radiation therapy.  CT images were obtained.  Surface markings were placed.  The CT images were loaded into the planning software.  Then the target and avoidance structures were contoured.  Treatment planning then occurred.  The radiation prescription was entered and confirmed.  Then, I designed and supervised the construction of a total of *** medically necessary complex treatment devices.  I have requested : {CHL RAD ONC TX CHOICES:22734}.  I have ordered:{CHL RAD ONC TX PLAN POST SD:[id number] ? ?PLAN:  The patient will receive *** Gy in *** fractions. ? ?----------------------------------- ? ?Blair Promise, PhD, MD ? ?This document serves as a record of services personally performed by Gery Pray, MD. It was created on his behalf by Clerance Lav, a trained medical scribe. The creation of this record is based on the scribe's personal observations and the provider's statements to them. This document has been checked and approved by the attending provider. ? ?

## 2019-07-10 ENCOUNTER — Ambulatory Visit
Admission: RE | Admit: 2019-07-10 | Discharge: 2019-07-10 | Disposition: A | Discharge status: Home / Self Care | Payer: PRIVATE HEALTH INSURANCE | Source: Ambulatory Visit | Attending: Radiation Oncology | Admitting: Radiation Oncology

## 2019-07-10 ENCOUNTER — Encounter: Payer: Self-pay | Admitting: Radiation Oncology

## 2019-07-10 ENCOUNTER — Other Ambulatory Visit: Payer: Self-pay

## 2019-07-10 DIAGNOSIS — C9030 Solitary plasmacytoma not having achieved remission: Secondary | ICD-10-CM | POA: Diagnosis present

## 2019-07-10 DIAGNOSIS — J449 Chronic obstructive pulmonary disease, unspecified: Secondary | ICD-10-CM | POA: Insufficient documentation

## 2019-07-10 DIAGNOSIS — Z7982 Long term (current) use of aspirin: Secondary | ICD-10-CM | POA: Diagnosis not present

## 2019-07-10 DIAGNOSIS — I110 Hypertensive heart disease with heart failure: Secondary | ICD-10-CM | POA: Insufficient documentation

## 2019-07-10 DIAGNOSIS — E785 Hyperlipidemia, unspecified: Secondary | ICD-10-CM | POA: Diagnosis not present

## 2019-07-10 DIAGNOSIS — C9020 Extramedullary plasmacytoma not having achieved remission: Secondary | ICD-10-CM | POA: Insufficient documentation

## 2019-07-10 DIAGNOSIS — Z79899 Other long term (current) drug therapy: Secondary | ICD-10-CM | POA: Insufficient documentation

## 2019-07-10 DIAGNOSIS — M899 Disorder of bone, unspecified: Secondary | ICD-10-CM

## 2019-07-10 DIAGNOSIS — I5032 Chronic diastolic (congestive) heart failure: Secondary | ICD-10-CM | POA: Diagnosis not present

## 2019-07-10 DIAGNOSIS — Z510 Encounter for antineoplastic radiation therapy: Secondary | ICD-10-CM | POA: Diagnosis present

## 2019-07-10 NOTE — Patient Instructions (Signed)
Coronavirus (COVID-19) Are you at risk?  Are you at risk for the Coronavirus (COVID-19)?  To be considered HIGH RISK for Coronavirus (COVID-19), you have to meet the following criteria:  . Traveled to [place], [place], [place], [place] or [place]; or in the [place] to [place], [place], [place], or [place]; and have fever, cough, and shortness of breath within the last 2 weeks of travel OR . Been in close contact with a person diagnosed with COVID-19 within the last 2 weeks and have fever, cough, and shortness of breath . IF YOU DO NOT MEET THESE CRITERIA, YOU ARE CONSIDERED LOW RISK FOR COVID-19.  What to do if you are HIGH RISK for COVID-19?  . If you are having a medical emergency, call 911. . Seek medical care right away. Before you go to a doctor's office, urgent care or emergency department, call ahead and tell them about your recent travel, contact with someone diagnosed with COVID-19, and your symptoms. You should receive instructions from your physician's office regarding next steps of care.  . When you arrive at healthcare provider, tell the healthcare staff immediately you have returned from visiting [place], [place], [place], [place] or [place]; or traveled in the [place] to [place], [place], [place], or [place]; in the last two weeks or you have been in close contact with a person diagnosed with COVID-19 in the last 2 weeks.   . Tell the health care staff about your symptoms: fever, cough and shortness of breath. . After you have been seen by a medical provider, you will be either: o Tested for (COVID-19) and discharged home on quarantine except to seek medical care if symptoms worsen, and asked to  - Stay home and avoid contact with others until you get your results (4-5 days)  - Avoid travel on public transportation if possible (such as bus, train, or airplane) or o Sent to the Emergency Department by EMS for evaluation, COVID-19 testing, and possible  admission depending on your condition and test results.  What to do if you are LOW RISK for COVID-19?  Reduce your risk of any infection by using the same precautions used for avoiding the common cold or flu:  . Wash your hands often with soap and warm water for at least 20 seconds.  If soap and water are not readily available, use an alcohol-based hand sanitizer with at least 60% alcohol.  . If coughing or sneezing, cover your mouth and nose by coughing or sneezing into the elbow areas of your shirt or coat, into a tissue or into your sleeve (not your hands). . Avoid shaking hands with others and consider head nods or verbal greetings only. . Avoid touching your eyes, nose, or mouth with unwashed hands.  . Avoid close contact with people who are sick. . Avoid places or events with large numbers of people in one location, like concerts or sporting events. . Carefully consider travel plans you have or are making. . If you are planning any travel outside or inside the [place], visit the CDC's Travelers' Health webpage for the latest health notices. . If you have some symptoms but not all symptoms, continue to monitor at home and seek medical attention if your symptoms worsen. . If you are having a medical emergency, call 911.   ADDITIONAL HEALTHCARE OPTIONS FOR PATIENTS  [facility] / e-Visit: [email or web address]         [facility]: [phone]  [facility]: [phone]                   [facility]: [phone]   

## 2019-07-10 NOTE — Progress Notes (Signed)
Histology and Location of Primary Cancer: Plasma cell disorder  Location(s) of Symptomatic tumor(s): Plasmacytoma of L4 vertebral body: -MRI of the lumbar spine showed aggressive, contrast-enhancing L4 lesion with posterior cortical breakthrough with mild thecal sac narrowing.  No spinal canal or neural foraminal stenosis.  No other areas on the spine.  MRI from 2018 showed L4 lesion but was much smaller. -PET scan on 06/10/2019 reviewed by me showed mildly increased FDG uptake associated with mixed lytic and sclerotic lesion involving L4 vertebral body, SUV max 4.4.  No other active areas of uptake seen.  Past/Anticipated chemotherapy by medical oncology, if any: Per Dr. Delton Coombes 06/25/19: 1.  Plasmacytoma of L4 vertebral body: -MRI of the lumbar spine showed aggressive, contrast-enhancing L4 lesion with posterior cortical breakthrough with mild thecal sac narrowing.  No spinal canal or neural foraminal stenosis.  No other areas on the spine.  MRI from 2018 showed L4 lesion but was much smaller. -PET scan on 06/10/2019 reviewed by me showed mildly increased FDG uptake associated with mixed lytic and sclerotic lesion involving L4 vertebral body, SUV max 4.4.  No other active areas of uptake seen. -Repeat serum immunofixation shows IgG kappa monoclonal protein.  SPEP did not show any M spike.  Free kappa light chains are slightly elevated at 25.3.  Ratio is 1.12.  Lambda light chains are 21.9.  Beta-2 microglobulin is 2.2. -24-hour urine was negative for UPEP and immunofixation.  Protein was 130 mg. -L4 needle biopsy on 06/19/2019 showed minute segments of bone and soft tissue, limited cellularity.  IHC for CD138 highlights scattering plasma cells.  Cytokeratin is negative.  Few kappa positive plasma cells present.  Overall material is very limited and essentially nondiagnostic. -She likely has plasmacytoma because of prevalence of Kappa positive plasma cells in the L4 biopsy.  Repeat biopsy might not be very  helpful because of the location. -I have recommended bone marrow biopsy to rule out marrow involvement.  If the marrow involvement is negative, she could be considered as having plasmacytoma. -She has a lot of pain in the lower back region which gets worse on minimal walking.  She will benefit from radiation therapy.  I will make a referral to radiation oncology. -I will see her back after the bone marrow biopsy.  2.  Low back pain: -She is taking half tablet of hydrocodone 10/325 every 6 hours as needed which is helping although causing minimal drowsiness. -She tried taking tramadol which did not help. -She also complained of right shoulder pain and right arm pain.  This is not new but has gotten slightly worse.  Could be related to shoulder joint.  I have told her to try some NSAIDs.  Patient's main complaints related to symptomatic tumor(s) are: pain that is somewhat relieved with half tab   Pain on a scale of 0-10 is: Pt reports pain in lower back, currently 8/10 worse with movement.   If Spine Met(s), symptoms, if any, include:  Bowel/Bladder retention or incontinence (please describe): pt denies  Numbness or weakness in extremities (please describe): pt reports weakness with walking  Current Decadron regimen, if applicable: not  Ambulatory status? Walker? Wheelchair?: pt presents today in [place]. Pt reports furniture/wall surfing and uses a walker household distances.  SAFETY ISSUES:  Prior radiation? no  Pacemaker/ICD? no  Possible current pregnancy? no  Is the patient on methotrexate? no  Additional Complaints / other details:  Pt presents today for initial consult with Dr. Sondra Come for Radiation Oncology. Pt is unaccompanied.  BP (!) 172/84 (BP Location: Left Arm, Patient Position: Sitting)   Pulse (!) 59   Temp 98.3 F (36.8 C) (Temporal)   Resp 18   Ht 5' 6"  (1.676 m)   Wt 248 lb 6 oz (112.7 kg)   BMI 40.09 kg/m   Wt Readings from Last 3 Encounters:  07/10/19 248  lb 6 oz (112.7 kg)  06/25/19 246 lb 7 oz (111.8 kg)  06/21/19 240 lb (108.9 kg)   Loma Sousa, RN BSN

## 2019-07-12 ENCOUNTER — Telehealth (INDEPENDENT_AMBULATORY_CARE_PROVIDER_SITE_OTHER): Payer: No Typology Code available for payment source | Admitting: Cardiology

## 2019-07-12 ENCOUNTER — Encounter: Payer: Self-pay | Admitting: Cardiology

## 2019-07-12 VITALS — BP 172/84 | HR 59 | Ht 66.0 in | Wt 240.0 lb

## 2019-07-12 DIAGNOSIS — Z7189 Other specified counseling: Secondary | ICD-10-CM | POA: Diagnosis not present

## 2019-07-12 DIAGNOSIS — I10 Essential (primary) hypertension: Secondary | ICD-10-CM

## 2019-07-12 DIAGNOSIS — R002 Palpitations: Secondary | ICD-10-CM

## 2019-07-12 MED ORDER — CHLORTHALIDONE 25 MG PO TABS: 25.0000 mg | ORAL_TABLET | Freq: Every day | ORAL | 3 refills | Status: DC

## 2019-07-12 NOTE — Patient Instructions (Signed)
Medication Instructions:  START CHLORTHALIDONE 25 MG DAILY   Labwork: 2 WEEKS  BMET MAGNESIUM   Testing/Procedures: NONE  Follow-Up: Your physician wants you to follow-up in: 6 MONTHS.You will receive a reminder letter in the mail two months in advance. If you don't receive a letter, please call our office to schedule the follow-up appointment.   Any Other Special Instructions Will Be Listed Below (If Applicable).  PLEASE CALL WITH BLOOD PRESSURES IN 2 WEEKS    If you need a refill on your cardiac medications before your next appointment, please call your pharmacy.

## 2019-07-12 NOTE — Addendum Note (Signed)
Addended by: Debbora Lacrosse R on: 07/12/2019 03:52 PM   Modules accepted: Orders

## 2019-07-12 NOTE — Progress Notes (Signed)
Virtual Visit via Video Note   This visit type was conducted due to national recommendations for restrictions regarding the COVID-19 Pandemic (e.g. social distancing) in an effort to limit this patient's exposure and mitigate transmission in our community.  Due to her co-morbid illnesses, this patient is at least at moderate risk for complications without adequate follow up.  This format is felt to be most appropriate for this patient at this time.  All issues noted in this document were discussed and addressed.  A limited physical exam was performed with this format.  Please refer to the patient's chart for her consent to telehealth for [facility].   The patient was identified using 2 identifiers.  Date:  07/12/2019   ID:  Hannah Cooper, DOB [birth year], MRN [id number]  Patient Location: Home Provider Location: Office  PCP:  Jake Samples, PA-C  Cardiologist:  Carlyle Dolly, MD  Electrophysiologist:  None   Evaluation Performed:  Follow-Up Visit  Chief Complaint:  Follow up  History of Present Illness:    Hannah Cooper is a 64 y.o. female seen today for follow up of the following medical problems. This is a focused visit for recent issues with palpitations and HTN.   1. Palpitations - 2018 monitor PAC and PVCs - has been on lopressor - ongoign palpitaitons. Occurs daily, typically lasts just a few seconds  - last visit due to ongoing symtpoms we added diltiazem 30mg  bid, she remained on lopressor 100mg  bid - compliant with meds, symptoms improved.    2. HTN - she reports her insurance would not cover chlorthalidone, stopped some time ago. Has new insurance however if needs to restart.   - last visit started diltiazem for ongoing palpitations and elevated bp's - home bp's remain elevated    SH: works at [employer] for kids. She has 3 adopted kids (9,10,11). Great grand baby any day now.   The patient does not have symptoms concerning for  COVID-19 infection (fever, chills, cough, or new shortness of breath).    Past Medical History:  Diagnosis Date  . Arthritis   . Atypical chest pain    chronic  . Chronic back pain   . Chronic diastolic CHF (congestive heart failure) ([place])   . COPD (chronic obstructive pulmonary disease) ([place])   . DDD (degenerative disc disease), cervical   . Hyperlipidemia   . Hypertension   . Lumbar radiculopathy   . Meningitis   . Normal coronary arteries 2014  . Palpitations   . Premature atrial contractions   . PVC's (premature ventricular contractions)    Past Surgical History:  Procedure Laterality Date  . ABDOMINAL HYSTERECTOMY    . IR FLUORO GUIDED NEEDLE PLC ASPIRATION/INJECTION LOC  06/19/2019  . LEFT HEART CATHETERIZATION WITH CORONARY ANGIOGRAM N/A 09/05/2012   Procedure: LEFT HEART CATHETERIZATION WITH CORONARY ANGIOGRAM;  Surgeon: Wellington Hampshire, MD;  Location: [facility];  Service: Cardiovascular;  Laterality: N/A;  . THYROID SURGERY  2002     No outpatient medications have been marked as taking for the 07/12/19 encounter (Appointment) with Arnoldo Lenis, MD.     Allergies:   Other   Social History   Tobacco Use  . Smoking status: Never Smoker  . Smokeless tobacco: Never Used  Substance Use Topics  . Alcohol use: No    Alcohol/week: 0.0 standard drinks  . Drug use: No     Family Hx: The patient's family history includes Heart attack (age of onset: 79) in  her sister; Heart attack (age of onset: 27) in her mother.  ROS:   Please see the history of present illness.     All other systems reviewed and are negative.   Prior CV studies:   The following studies were reviewed today:  08/2012 cath Hemodynamics: AO: 164/82 mmHg LV: 169/14 mmHg LVEDP: 24 mmHg  Coronary angiography: Coronary dominance: Right   Left Main: Normal  Left Anterior Descending (LAD): Normal in size with no significant disease.  1st diagonal (D1): Small in size with  minor irregularities.  2nd diagonal (D2): Normal in size with no significant disease.  3rd diagonal (D3): Normal in size with no significant disease.  Circumflex (LCx): Normal in size and nondominant. The vessel has no significant disease.  1st obtuse marginal: Small in size with no significant disease.  2nd obtuse marginal: Small in size with no significant disease.  3rd obtuse marginal: Large in size with no significant disease.  Right Coronary Artery: Normal in size and dominant. The vessel has no significant disease.  posterior descending artery: Normal in size with no significant disease.  posterior lateral branchs: Normal in size with no significant disease.  Left ventriculography: Left ventricular systolic function is normal , LVEF is estimated at 60-65% %, there is no significant mitral regurgitation   Final Conclusions:  1.Normal coronary arteries. 2. Normal LV systolic function. 3. Moderately elevated left ventricular end-diastolic pressure likely due to diastolic heart failure.   06/2013 echo Study Conclusions  - Procedure narrative: Transthoracic echocardiography. Image quality was suboptimal. The study was technically difficult, as a result of poor sound wave transmission and restricted patient mobility. - Left ventricle: The cavity size was normal. Wall thickness was increased in a pattern of mild to moderate LVH. Diastolic dysfunction noted, grade indeterminant. Systolic function was normal. The estimated ejection fraction was in the range of 55% to 60%. Wall motion was normal; there were no regional wall motion abnormalities.   Jan 2017 Event monitor: no arrhythmias  06/2014 CPX Conclusion: Exercise testing with gas exchange demonstrates a mild-moderately reduced functional capacity when compared to matched sedentary norms. There was an in-adequate HR response to the exercise response and flat O2 pulse. This high HR  reserve was likely high due to pulmonary limitations based on restrictive spirometry and RR 45 at peak exercise and reaching maximum ventilatory limits. However, it could have easily been due to poor effort. The sub-maximal effort is clouding interpretation of the test. Nevertheless, correlation of resting spirometry test Showing restriction with outpatient pulmonary function test is warranted   05/2016 heart monitor  Telemetry tracings predominately show sinus rhythm  Reported symptoms correlate with occasional PVCs and PACs    02/2016 echo Study Conclusions  - Left ventricle: The cavity size was normal. Systolic function was vigorous. The estimated ejection fraction was in the range of 65% to 70%. Wall motion was normal; there were no regional wall motion abnormalities. There was an increased relative contribution of atrial contraction to ventricular filling. Doppler parameters are consistent with abnormal left ventricular relaxation (grade 1 diastolic dysfunction). - Pulmonic valve: There was trivial regurgitation. - Pulmonary arteries: Systolic pressure could not be accurately estimated.  Labs/Other Tests and Data Reviewed:    EKG:  No ECG reviewed.  Recent Labs: 05/28/2019: ALT 49 06/19/2019: BUN 10; Creatinine, Ser 0.72; Potassium 4.1; Sodium 140 07/08/2019: Hemoglobin 13.0; Platelets 245   Recent Lipid Panel Lab Results  Component Value Date/Time   CHOL 123 03/14/2016 07:39 AM   TRIG 85 03/14/2016  07:39 AM   HDL 46 03/14/2016 07:39 AM   CHOLHDL 2.7 03/14/2016 07:39 AM   LDLCALC 60 03/14/2016 07:39 AM    Wt Readings from Last 3 Encounters:  07/10/19 248 lb 6 oz (112.7 kg)  06/25/19 246 lb 7 oz (111.8 kg)  06/21/19 240 lb (108.9 kg)     Objective:    Vital Signs:   Today's Vitals   07/12/19 1200  BP: (!) 172/84  Pulse: (!) 59  SpO2: 98%  Weight: 240 lb (108.9 kg)  Height: 5\' 6"  (1.676 m)   Body mass index is 38.74 kg/m. NOrmal  affect. Normal speech pattern and tone. Comfortable, no apparent distress. No visual audible signs of SOB or wheezing.   ASSESSMENT & PLAN:     1. Palpitations - symptosm improved, currently on lopressor 100mg  bid and diltiazem 30mg  bid - continue current therapy.    2.HTN - remains above goal, start chlorthalidone 25mg  daily. BMET/Mg in 2 weeks - call with home bp's in 2 weeks.    COVID-19 Education: The signs and symptoms of COVID-19 were discussed with the patient and how to seek care for testing (follow up with PCP or arrange E-visit).  The importance of social distancing was discussed today.  Time:   Today, I have spent 21 minutes with the patient with telehealth technology discussing the above problems.     Medication Adjustments/Labs and Tests Ordered: Current medicines are reviewed at length with the patient today.  Concerns regarding medicines are outlined above.   Tests Ordered: No orders of the defined types were placed in this encounter.   Medication Changes: No orders of the defined types were placed in this encounter.   Follow Up:  Either In Person or Virtual in 6 month(s)  Signed, Carlyle Dolly, MD  07/12/2019 11:49 AM    [place]

## 2019-07-15 ENCOUNTER — Other Ambulatory Visit (HOSPITAL_COMMUNITY): Payer: Self-pay | Admitting: *Deleted

## 2019-07-15 ENCOUNTER — Encounter (HOSPITAL_COMMUNITY): Payer: Self-pay | Admitting: Hematology

## 2019-07-15 DIAGNOSIS — M899 Disorder of bone, unspecified: Secondary | ICD-10-CM

## 2019-07-16 ENCOUNTER — Other Ambulatory Visit: Payer: Self-pay

## 2019-07-16 ENCOUNTER — Other Ambulatory Visit: Payer: Self-pay | Admitting: Radiation Oncology

## 2019-07-16 ENCOUNTER — Ambulatory Visit
Admission: RE | Admit: 2019-07-16 | Discharge: 2019-07-16 | Disposition: A | Discharge status: Home / Self Care | Payer: PRIVATE HEALTH INSURANCE | Source: Ambulatory Visit | Attending: Radiation Oncology | Admitting: Radiation Oncology

## 2019-07-16 DIAGNOSIS — Z510 Encounter for antineoplastic radiation therapy: Secondary | ICD-10-CM | POA: Diagnosis not present

## 2019-07-16 LAB — SURGICAL PATHOLOGY

## 2019-07-16 MED ORDER — HYDROCODONE-ACETAMINOPHEN 10-325 MG PO TABS: 1.0000 | ORAL_TABLET | Freq: Two times a day (BID) | ORAL | 0 refills | Status: DC | PRN

## 2019-07-17 ENCOUNTER — Other Ambulatory Visit: Payer: Self-pay

## 2019-07-17 ENCOUNTER — Ambulatory Visit: Payer: PRIVATE HEALTH INSURANCE

## 2019-07-17 ENCOUNTER — Ambulatory Visit
Admission: RE | Admit: 2019-07-17 | Discharge: 2019-07-17 | Disposition: A | Discharge status: Home / Self Care | Payer: PRIVATE HEALTH INSURANCE | Source: Ambulatory Visit | Attending: Radiation Oncology | Admitting: Radiation Oncology

## 2019-07-17 DIAGNOSIS — Z510 Encounter for antineoplastic radiation therapy: Secondary | ICD-10-CM | POA: Diagnosis not present

## 2019-07-18 ENCOUNTER — Ambulatory Visit (HOSPITAL_COMMUNITY): Payer: No Typology Code available for payment source | Admitting: Hematology

## 2019-07-18 ENCOUNTER — Other Ambulatory Visit: Payer: Self-pay

## 2019-07-18 ENCOUNTER — Ambulatory Visit: Payer: No Typology Code available for payment source

## 2019-07-18 ENCOUNTER — Ambulatory Visit
Admission: RE | Admit: 2019-07-18 | Discharge: 2019-07-18 | Disposition: A | Discharge status: Home / Self Care | Payer: No Typology Code available for payment source | Source: Ambulatory Visit | Attending: Radiation Oncology | Admitting: Radiation Oncology

## 2019-07-18 ENCOUNTER — Other Ambulatory Visit: Payer: Self-pay | Admitting: Radiation Oncology

## 2019-07-18 DIAGNOSIS — C9030 Solitary plasmacytoma not having achieved remission: Secondary | ICD-10-CM | POA: Insufficient documentation

## 2019-07-18 DIAGNOSIS — Z8249 Family history of ischemic heart disease and other diseases of the circulatory system: Secondary | ICD-10-CM | POA: Diagnosis not present

## 2019-07-18 DIAGNOSIS — Z510 Encounter for antineoplastic radiation therapy: Secondary | ICD-10-CM | POA: Insufficient documentation

## 2019-07-18 DIAGNOSIS — Z7982 Long term (current) use of aspirin: Secondary | ICD-10-CM | POA: Diagnosis not present

## 2019-07-18 DIAGNOSIS — Z791 Long term (current) use of non-steroidal anti-inflammatories (NSAID): Secondary | ICD-10-CM | POA: Diagnosis not present

## 2019-07-18 DIAGNOSIS — Z79899 Other long term (current) drug therapy: Secondary | ICD-10-CM | POA: Diagnosis not present

## 2019-07-18 DIAGNOSIS — M454 Ankylosing spondylitis of thoracic region: Secondary | ICD-10-CM | POA: Diagnosis not present

## 2019-07-18 DIAGNOSIS — M545 Low back pain: Secondary | ICD-10-CM | POA: Diagnosis not present

## 2019-07-18 DIAGNOSIS — J449 Chronic obstructive pulmonary disease, unspecified: Secondary | ICD-10-CM | POA: Diagnosis not present

## 2019-07-18 DIAGNOSIS — Z923 Personal history of irradiation: Secondary | ICD-10-CM | POA: Diagnosis not present

## 2019-07-18 DIAGNOSIS — I110 Hypertensive heart disease with heart failure: Secondary | ICD-10-CM | POA: Diagnosis not present

## 2019-07-18 DIAGNOSIS — I5032 Chronic diastolic (congestive) heart failure: Secondary | ICD-10-CM | POA: Diagnosis not present

## 2019-07-18 MED ORDER — PROCHLORPERAZINE MALEATE 10 MG PO TABS: 10.0000 mg | ORAL_TABLET | Freq: Four times a day (QID) | ORAL | 0 refills | Status: DC | PRN

## 2019-07-19 ENCOUNTER — Other Ambulatory Visit: Payer: Self-pay

## 2019-07-19 ENCOUNTER — Ambulatory Visit: Payer: No Typology Code available for payment source

## 2019-07-19 ENCOUNTER — Ambulatory Visit
Admission: RE | Admit: 2019-07-19 | Discharge: 2019-07-19 | Disposition: A | Discharge status: Home / Self Care | Payer: No Typology Code available for payment source | Source: Ambulatory Visit | Attending: Radiation Oncology | Admitting: Radiation Oncology

## 2019-07-19 DIAGNOSIS — C9030 Solitary plasmacytoma not having achieved remission: Secondary | ICD-10-CM | POA: Diagnosis not present

## 2019-07-22 ENCOUNTER — Inpatient Hospital Stay (HOSPITAL_COMMUNITY): Payer: No Typology Code available for payment source | Attending: Hematology | Admitting: Hematology

## 2019-07-22 ENCOUNTER — Ambulatory Visit: Payer: No Typology Code available for payment source

## 2019-07-22 ENCOUNTER — Encounter (HOSPITAL_COMMUNITY): Payer: Self-pay | Admitting: Hematology

## 2019-07-22 ENCOUNTER — Other Ambulatory Visit: Payer: Self-pay

## 2019-07-22 ENCOUNTER — Ambulatory Visit
Admission: RE | Admit: 2019-07-22 | Discharge: 2019-07-22 | Disposition: A | Discharge status: Home / Self Care | Payer: No Typology Code available for payment source | Source: Ambulatory Visit | Attending: Radiation Oncology | Admitting: Radiation Oncology

## 2019-07-22 VITALS — BP 145/82 | HR 84 | Temp 96.9°F | Resp 18 | Wt 241.3 lb

## 2019-07-22 DIAGNOSIS — Z923 Personal history of irradiation: Secondary | ICD-10-CM | POA: Insufficient documentation

## 2019-07-22 DIAGNOSIS — C9030 Solitary plasmacytoma not having achieved remission: Secondary | ICD-10-CM | POA: Diagnosis not present

## 2019-07-22 DIAGNOSIS — Z8249 Family history of ischemic heart disease and other diseases of the circulatory system: Secondary | ICD-10-CM | POA: Insufficient documentation

## 2019-07-22 DIAGNOSIS — Z7982 Long term (current) use of aspirin: Secondary | ICD-10-CM | POA: Insufficient documentation

## 2019-07-22 DIAGNOSIS — M454 Ankylosing spondylitis of thoracic region: Secondary | ICD-10-CM | POA: Insufficient documentation

## 2019-07-22 DIAGNOSIS — M545 Low back pain: Secondary | ICD-10-CM | POA: Insufficient documentation

## 2019-07-22 DIAGNOSIS — J449 Chronic obstructive pulmonary disease, unspecified: Secondary | ICD-10-CM | POA: Insufficient documentation

## 2019-07-22 DIAGNOSIS — I110 Hypertensive heart disease with heart failure: Secondary | ICD-10-CM | POA: Insufficient documentation

## 2019-07-22 DIAGNOSIS — D729 Disorder of white blood cells, unspecified: Secondary | ICD-10-CM

## 2019-07-22 DIAGNOSIS — M899 Disorder of bone, unspecified: Secondary | ICD-10-CM | POA: Diagnosis not present

## 2019-07-22 DIAGNOSIS — Z791 Long term (current) use of non-steroidal anti-inflammatories (NSAID): Secondary | ICD-10-CM | POA: Insufficient documentation

## 2019-07-22 DIAGNOSIS — Z79899 Other long term (current) drug therapy: Secondary | ICD-10-CM | POA: Insufficient documentation

## 2019-07-22 DIAGNOSIS — I5032 Chronic diastolic (congestive) heart failure: Secondary | ICD-10-CM | POA: Insufficient documentation

## 2019-07-22 NOTE — Patient Instructions (Addendum)
[facility] at [facility] Discharge Instructions  You were seen today by Dr. Katragadda. He went over your recent lab results. He will see you back in 4 months for labs and follow up.   Thank you for choosing [facility] at [facility] to provide your oncology and hematology care.  To afford each patient quality time with our provider, please arrive at least 15 minutes before your scheduled appointment time.   If you have a lab appointment with the Cancer Center please come in thru the  Main Entrance and check in at the main information desk  You need to re-schedule your appointment should you arrive 10 or more minutes late.  We strive to give you quality time with our providers, and arriving late affects you and other patients whose appointments are after yours.  Also, if you no show three or more times for appointments you may be dismissed from the clinic at the providers discretion.     Again, thank you for choosing [facility].  Our hope is that these requests will decrease the amount of time that you wait before being seen by our physicians.       _____________________________________________________________  Should you have questions after your visit to [facility], please contact our office at [phone] between the hours of 8:00 a.m. and 4:30 p.m.  Voicemails left after 4:00 p.m. will not be returned until the following business day.  For prescription refill requests, have your pharmacy contact our office and allow 72 hours.    Cancer Center Support Programs:   > Cancer Support Group  2nd Tuesday of the month 1pm-2pm, Journey Room    

## 2019-07-22 NOTE — Assessment & Plan Note (Signed)
1.  Plasmacytoma of L4 vertebral body: -MRI lumbar spine shows L4 lesion. -PET scan on 06/10/2019 shows mildly increased FDG uptake associated with mixed lytic/sclerotic lesion involving L4 vertebral body SUV 4.4. -Serum immunofixation shows IgG kappa monoclonal protein.  SPEP-no M spike.  Kappa light chains elevated at 25.3.  Ratio 1.12.  Lambda light chains 21.9.  Beta-2 microglobulin 2.2. -24-hour urine was negative for UPEP and immunofixation.  Protein was 130 mg. -L4 needle biopsy on 06/19/2019 showed minute segments of the bone and soft tissue, limited cellularity.  IHC for CD138 highlights scattering plasma cells.  Cytokeratin is negative.  Few kappa positive plasma cells present.  Overall material is very limited and essentially nondiagnostic. -Clinically this is consistent with plasmacytoma.  We reviewed bone marrow biopsy results which showed normocellular marrow.  Cytogenetics are normal. -She was evaluated by Dr. Sondra Come and radiation therapy was started on 07/16/2019. -She had some problems with nausea which is under control with antinausea medicine. -I plan to see her back in 3 to 4 months for follow-up with repeat labs.  2.  Low back pain: -She is taking half tablet of hydrocodone 10/325 every 6 hours as needed which is helping. -Likely this will improve upon completion of radiation.

## 2019-07-22 NOTE — Progress Notes (Signed)
[place], [place] [address]   CLINIC:  Medical Oncology/Hematology  PCP:  Scherrie Bateman [place] [id number] [phone]   REASON FOR VISIT:  Follow-up for L4 bone lesion.  CURRENT THERAPY: Under work-up.    INTERVAL HISTORY:  Hannah Cooper 64 y.o. female seen for follow-up of plasmacytoma of L4.  She reportedly started radiation therapy last Tuesday.  Reports appetite 75%.  Energy levels are low.  She had bone marrow biopsy done.  Complains of pain, sharp in nature in the lower back, rated as 7 out of 10.  Shortness of breath on exertion when pain is severe.  She did have episodes of nausea and vomiting after radiation was started.  They are well controlled now with nausea medication.   REVIEW OF SYSTEMS:  Review of Systems  Constitutional: Positive for fatigue.  Respiratory: Positive for shortness of breath.   Gastrointestinal: Positive for nausea.  Musculoskeletal: Positive for back pain.  All other systems reviewed and are negative.    PAST MEDICAL/SURGICAL HISTORY:  Past Medical History:  Diagnosis Date  . Arthritis   . Atypical chest pain    chronic  . Chronic back pain   . Chronic diastolic CHF (congestive heart failure) ([facility])   . COPD (chronic obstructive pulmonary disease) ([facility])   . DDD (degenerative disc disease), cervical   . Hyperlipidemia   . Hypertension   . Lumbar radiculopathy   . Meningitis   . Normal coronary arteries 2014  . Palpitations   . Premature atrial contractions   . PVC's (premature ventricular contractions)    Past Surgical History:  Procedure Laterality Date  . ABDOMINAL HYSTERECTOMY    . IR FLUORO GUIDED NEEDLE PLC ASPIRATION/INJECTION LOC  06/19/2019  . LEFT HEART CATHETERIZATION WITH CORONARY ANGIOGRAM N/A 09/05/2012   Procedure: LEFT HEART CATHETERIZATION WITH CORONARY ANGIOGRAM;  Surgeon: Wellington Hampshire, MD;  Location: [facility];  Service:  Cardiovascular;  Laterality: N/A;  . THYROID SURGERY  2002     SOCIAL HISTORY:  Social History   Socioeconomic History  . Marital status: Married    Spouse name: Not on file  . Number of children: Not on file  . Years of education: Not on file  . Highest education level: Not on file  Occupational History  . Not on file  Tobacco Use  . Smoking status: Never Smoker  . Smokeless tobacco: Never Used  Substance and Sexual Activity  . Alcohol use: No    Alcohol/week: 0.0 standard drinks  . Drug use: No  . Sexual activity: Yes    Partners: Male  Other Topics Concern  . Not on file  Social History Narrative  . Not on file   Social Determinants of Health   Financial Resource Strain: Low Risk   . Difficulty of Paying Living Expenses: Not hard at all  Food Insecurity: No Food Insecurity  . Worried About Charity fundraiser in the Last Year: Never true  . Ran Out of Food in the Last Year: Never true  Transportation Needs: No Transportation Needs  . Lack of Transportation (Medical): No  . Lack of Transportation (Non-Medical): No  Physical Activity: Inactive  . Days of Exercise per Week: 0 days  . Minutes of Exercise per Session: 0 min  Stress: Stress Concern Present  . Feeling of Stress : To some extent  Social Connections: Not Isolated  . Frequency of Communication with Friends and Family: More than  three times a week  . Frequency of Social Gatherings with Friends and Family: Never  . Attends Religious Services: More than 4 times per year  . Active Member of Clubs or Organizations: Yes  . Attends Archivist Meetings: More than 4 times per year  . Marital Status: Married  Human resources officer Violence: Not At Risk  . Fear of Current or Ex-Partner: No  . Emotionally Abused: No  . Physically Abused: No  . Sexually Abused: No    FAMILY HISTORY:  Family History  Problem Relation Age of Onset  . Heart attack Mother 66  . Heart attack Sister 37    CURRENT  MEDICATIONS:  Outpatient Encounter Medications as of 07/22/2019  Medication Sig  . acetaminophen (TYLENOL) 500 MG tablet Take 1,000 mg by mouth every 6 (six) hours as needed for headache (pain).   Marland Kitchen albuterol (PROVENTIL) (2.5 MG/3ML) 0.083% nebulizer solution Take 2.5 mg by nebulization every 6 (six) hours as needed for wheezing or shortness of breath.  . Albuterol Sulfate (PROAIR RESPICLICK) 702 (90 Base) MCG/ACT AEPB Inhale 2 puffs into the lungs every 4 (four) hours as needed (shortness of breath/ wheezing).  Marland Kitchen aspirin EC 81 MG tablet Take 81 mg by mouth daily.  . chlorthalidone (HYGROTON) 25 MG tablet Take 1 tablet (25 mg total) by mouth daily.  . cycloSPORINE (RESTASIS) 0.05 % ophthalmic emulsion Place 1 drop into both eyes 2 (two) times daily.   Marland Kitchen diltiazem (CARDIZEM) 30 MG tablet Take 1 tablet (30 mg total) by mouth 2 (two) times daily.  . furosemide (LASIX) 20 MG tablet Take 1 tablet daily as needed may take 2 tablets prn (Patient taking differently: Take 20 mg by mouth 2 (two) times daily as needed (fluid retention.). )  . HYDROcodone-acetaminophen (NORCO) 10-325 MG tablet Take 1 tablet by mouth 2 (two) times daily as needed (pain.).  Marland Kitchen ibuprofen (ADVIL,MOTRIN) 800 MG tablet Take 1 tablet by mouth 2 (two) times daily as needed (back pain).   . isosorbide mononitrate (IMDUR) 30 MG 24 hr tablet TAKE 1 TABLET (30 MG TOTAL) BY MOUTH DAILY. (Patient taking differently: Take 30 mg by mouth daily. )  . metoprolol tartrate (LOPRESSOR) 100 MG tablet Take 100 mg by mouth 2 (two) times daily.  . Misc. Devices MISC SHOWER CHAIR  . Misc. Devices MISC ELEVATED TOILET CHAIR  . prochlorperazine (COMPAZINE) 10 MG tablet Take 1 tablet (10 mg total) by mouth every 6 (six) hours as needed for nausea or vomiting.  . valACYclovir (VALTREX) 1000 MG tablet Take 1 tablet (1,000 mg total) by mouth 3 (three) times daily.   No facility-administered encounter medications on file as of 07/22/2019.    ALLERGIES:   Allergies  Allergen Reactions  . Other Swelling    [place] lipstick     PHYSICAL EXAM:  ECOG Performance status: 1  Vitals:   07/22/19 1459  BP: (!) 145/82  Pulse: 84  Resp: 18  Temp: (!) 96.9 F (36.1 C)  SpO2: 100%   Filed Weights   07/22/19 1459  Weight: 241 lb 4.8 oz (109.5 kg)    Physical Exam Vitals reviewed.  Constitutional:      Appearance: Normal appearance.  Musculoskeletal:        General: No swelling.  Neurological:     General: No focal deficit present.     Mental Status: She is alert and oriented to person, place, and time.  Psychiatric:        Mood and Affect: Mood normal.  Behavior: Behavior normal.      LABORATORY DATA:  I have reviewed the labs as listed.  CBC    Component Value Date/Time   WBC 3.9 (L) 07/08/2019 1050   RBC 4.38 07/08/2019 1050   HGB 13.0 07/08/2019 1050   HCT 39.9 07/08/2019 1050   PLT 245 07/08/2019 1050   MCV 91.1 07/08/2019 1050   MCH 29.7 07/08/2019 1050   MCHC 32.6 07/08/2019 1050   RDW 14.2 07/08/2019 1050   LYMPHSABS 1.7 07/08/2019 1050   MONOABS 0.5 07/08/2019 1050   EOSABS 0.1 07/08/2019 1050   BASOSABS 0.0 07/08/2019 1050   CMP Latest Ref Rng & Units 06/19/2019 05/28/2019 10/17/2017  Glucose 70 - 99 mg/dL 107(H) 93 105(H)  BUN 8 - 23 mg/dL 10 12 16   Creatinine 0.44 - 1.00 mg/dL 0.72 0.77 0.73  Sodium 135 - 145 mmol/L 140 140 140  Potassium 3.5 - 5.1 mmol/L 4.1 3.8 3.9  Chloride 98 - 111 mmol/L 106 105 107  CO2 22 - 32 mmol/L 24 27 28   Calcium 8.9 - 10.3 mg/dL 9.4 9.3 8.9  Total Protein 6.5 - 8.1 g/dL - 8.5(H) -  Total Bilirubin 0.3 - 1.2 mg/dL - 0.6 -  Alkaline Phos 38 - 126 U/L - 74 -  AST 15 - 41 U/L - 33 -  ALT 0 - 44 U/L - 49(H) -       DIAGNOSTIC IMAGING:  I have reviewed scans.     ASSESSMENT & PLAN:   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[place]) 1.  Plasmacytoma of L4 vertebral body: -MRI lumbar spine shows L4 lesion. -PET scan on 06/10/2019 shows mildly increased FDG uptake  associated with mixed lytic/sclerotic lesion involving L4 vertebral body SUV 4.4. -Serum immunofixation shows IgG kappa monoclonal protein.  SPEP-no M spike.  Kappa light chains elevated at 25.3.  Ratio 1.12.  Lambda light chains 21.9.  Beta-2 microglobulin 2.2. -24-hour urine was negative for UPEP and immunofixation.  Protein was 130 mg. -L4 needle biopsy on 06/19/2019 showed minute segments of the bone and soft tissue, limited cellularity.  IHC for CD138 highlights scattering plasma cells.  Cytokeratin is negative.  Few kappa positive plasma cells present.  Overall material is very limited and essentially nondiagnostic. -Clinically this is consistent with plasmacytoma.  We reviewed bone marrow biopsy results which showed normocellular marrow.  Cytogenetics are normal. -She was evaluated by Dr. Sondra Come and radiation therapy was started on 07/16/2019. -She had some problems with nausea which is under control with antinausea medicine. -I plan to see her back in 3 to 4 months for follow-up with repeat labs.  2.  Low back pain: -She is taking half tablet of hydrocodone 10/325 every 6 hours as needed which is helping. -Likely this will improve upon completion of radiation.      Orders placed this encounter:  Orders Placed This Encounter  Procedures  . Lactate dehydrogenase  . Protein electrophoresis, serum  . Kappa/lambda light chains     Derek Jack, [place] [phone]

## 2019-07-23 ENCOUNTER — Ambulatory Visit: Payer: No Typology Code available for payment source

## 2019-07-23 ENCOUNTER — Other Ambulatory Visit: Payer: Self-pay | Admitting: Radiation Oncology

## 2019-07-23 ENCOUNTER — Ambulatory Visit
Admission: RE | Admit: 2019-07-23 | Discharge: 2019-07-23 | Disposition: A | Discharge status: Home / Self Care | Payer: No Typology Code available for payment source | Source: Ambulatory Visit | Attending: Radiation Oncology | Admitting: Radiation Oncology

## 2019-07-23 ENCOUNTER — Other Ambulatory Visit: Payer: Self-pay

## 2019-07-23 DIAGNOSIS — C9030 Solitary plasmacytoma not having achieved remission: Secondary | ICD-10-CM | POA: Diagnosis not present

## 2019-07-23 MED ORDER — HYDROCODONE-ACETAMINOPHEN 10-325 MG PO TABS: 1.0000 | ORAL_TABLET | Freq: Two times a day (BID) | ORAL | 0 refills | Status: DC | PRN

## 2019-07-24 ENCOUNTER — Ambulatory Visit: Payer: No Typology Code available for payment source

## 2019-07-24 ENCOUNTER — Ambulatory Visit
Admission: RE | Admit: 2019-07-24 | Discharge: 2019-07-24 | Disposition: A | Discharge status: Home / Self Care | Payer: No Typology Code available for payment source | Source: Ambulatory Visit | Attending: Radiation Oncology | Admitting: Radiation Oncology

## 2019-07-24 ENCOUNTER — Other Ambulatory Visit: Payer: Self-pay

## 2019-07-24 DIAGNOSIS — C9030 Solitary plasmacytoma not having achieved remission: Secondary | ICD-10-CM | POA: Diagnosis not present

## 2019-07-25 ENCOUNTER — Other Ambulatory Visit: Payer: Self-pay

## 2019-07-25 ENCOUNTER — Ambulatory Visit: Payer: No Typology Code available for payment source

## 2019-07-25 ENCOUNTER — Ambulatory Visit
Admission: RE | Admit: 2019-07-25 | Discharge: 2019-07-25 | Disposition: A | Discharge status: Home / Self Care | Payer: No Typology Code available for payment source | Source: Ambulatory Visit | Attending: Radiation Oncology | Admitting: Radiation Oncology

## 2019-07-25 DIAGNOSIS — C9030 Solitary plasmacytoma not having achieved remission: Secondary | ICD-10-CM | POA: Diagnosis not present

## 2019-07-26 ENCOUNTER — Ambulatory Visit
Admission: RE | Admit: 2019-07-26 | Discharge: 2019-07-26 | Disposition: A | Discharge status: Home / Self Care | Payer: No Typology Code available for payment source | Source: Ambulatory Visit | Attending: Radiation Oncology | Admitting: Radiation Oncology

## 2019-07-26 ENCOUNTER — Other Ambulatory Visit: Payer: Self-pay

## 2019-07-26 ENCOUNTER — Ambulatory Visit: Payer: No Typology Code available for payment source

## 2019-07-26 DIAGNOSIS — C9030 Solitary plasmacytoma not having achieved remission: Secondary | ICD-10-CM | POA: Diagnosis not present

## 2019-07-29 ENCOUNTER — Ambulatory Visit: Payer: No Typology Code available for payment source

## 2019-07-29 ENCOUNTER — Other Ambulatory Visit: Payer: Self-pay

## 2019-07-29 ENCOUNTER — Ambulatory Visit
Admission: RE | Admit: 2019-07-29 | Discharge: 2019-07-29 | Disposition: A | Discharge status: Home / Self Care | Payer: No Typology Code available for payment source | Source: Ambulatory Visit | Attending: Radiation Oncology | Admitting: Radiation Oncology

## 2019-07-29 DIAGNOSIS — C9030 Solitary plasmacytoma not having achieved remission: Secondary | ICD-10-CM | POA: Diagnosis not present

## 2019-07-30 ENCOUNTER — Other Ambulatory Visit (HOSPITAL_COMMUNITY): Payer: Self-pay | Admitting: Nurse Practitioner

## 2019-07-30 ENCOUNTER — Other Ambulatory Visit: Payer: Self-pay

## 2019-07-30 ENCOUNTER — Ambulatory Visit
Admission: RE | Admit: 2019-07-30 | Discharge: 2019-07-30 | Disposition: A | Discharge status: Home / Self Care | Payer: No Typology Code available for payment source | Source: Ambulatory Visit | Attending: Radiation Oncology | Admitting: Radiation Oncology

## 2019-07-30 DIAGNOSIS — C9030 Solitary plasmacytoma not having achieved remission: Secondary | ICD-10-CM | POA: Diagnosis not present

## 2019-07-31 ENCOUNTER — Ambulatory Visit
Admission: RE | Admit: 2019-07-31 | Discharge: 2019-07-31 | Disposition: A | Discharge status: Home / Self Care | Payer: No Typology Code available for payment source | Source: Ambulatory Visit | Attending: Radiation Oncology | Admitting: Radiation Oncology

## 2019-07-31 ENCOUNTER — Other Ambulatory Visit: Payer: Self-pay

## 2019-07-31 DIAGNOSIS — C9030 Solitary plasmacytoma not having achieved remission: Secondary | ICD-10-CM | POA: Diagnosis not present

## 2019-08-01 ENCOUNTER — Other Ambulatory Visit: Payer: Self-pay

## 2019-08-01 ENCOUNTER — Ambulatory Visit
Admission: RE | Admit: 2019-08-01 | Discharge: 2019-08-01 | Disposition: A | Discharge status: Home / Self Care | Payer: No Typology Code available for payment source | Source: Ambulatory Visit | Attending: Radiation Oncology | Admitting: Radiation Oncology

## 2019-08-01 DIAGNOSIS — C9030 Solitary plasmacytoma not having achieved remission: Secondary | ICD-10-CM | POA: Diagnosis not present

## 2019-08-02 ENCOUNTER — Ambulatory Visit
Admission: RE | Admit: 2019-08-02 | Discharge: 2019-08-02 | Disposition: A | Discharge status: Home / Self Care | Payer: No Typology Code available for payment source | Source: Ambulatory Visit | Attending: Radiation Oncology | Admitting: Radiation Oncology

## 2019-08-02 ENCOUNTER — Other Ambulatory Visit: Payer: Self-pay

## 2019-08-02 DIAGNOSIS — C9030 Solitary plasmacytoma not having achieved remission: Secondary | ICD-10-CM | POA: Diagnosis not present

## 2019-08-05 ENCOUNTER — Other Ambulatory Visit: Payer: Self-pay

## 2019-08-05 ENCOUNTER — Ambulatory Visit
Admission: RE | Admit: 2019-08-05 | Discharge: 2019-08-05 | Disposition: A | Discharge status: Home / Self Care | Payer: No Typology Code available for payment source | Source: Ambulatory Visit | Attending: Radiation Oncology | Admitting: Radiation Oncology

## 2019-08-05 DIAGNOSIS — C9030 Solitary plasmacytoma not having achieved remission: Secondary | ICD-10-CM | POA: Diagnosis not present

## 2019-08-06 ENCOUNTER — Other Ambulatory Visit: Payer: Self-pay

## 2019-08-06 ENCOUNTER — Telehealth (HOSPITAL_COMMUNITY): Payer: Self-pay | Admitting: General Practice

## 2019-08-06 ENCOUNTER — Ambulatory Visit
Admission: RE | Admit: 2019-08-06 | Discharge: 2019-08-06 | Disposition: A | Discharge status: Home / Self Care | Payer: No Typology Code available for payment source | Source: Ambulatory Visit | Attending: Radiation Oncology | Admitting: Radiation Oncology

## 2019-08-06 DIAGNOSIS — C9030 Solitary plasmacytoma not having achieved remission: Secondary | ICD-10-CM | POA: Diagnosis not present

## 2019-08-06 DIAGNOSIS — M899 Disorder of bone, unspecified: Secondary | ICD-10-CM

## 2019-08-06 NOTE — Telephone Encounter (Signed)
[facility] CSW Progress Notes  Request received to get patient information on obtaining motorized scooter.  Checked w nurse navigator, D Redmond Pulling - was advised that this request needs to go through patients PCP.  Called patient and let her know she will need to contact PCP w this request.  Hannah Shell, LCSW Clinical Social Worker Phone:  [phone] Cell:  [phone]

## 2019-08-07 ENCOUNTER — Other Ambulatory Visit: Payer: Self-pay

## 2019-08-07 ENCOUNTER — Ambulatory Visit
Admission: RE | Admit: 2019-08-07 | Discharge: 2019-08-07 | Disposition: A | Discharge status: Home / Self Care | Payer: No Typology Code available for payment source | Source: Ambulatory Visit | Attending: Radiation Oncology | Admitting: Radiation Oncology

## 2019-08-07 DIAGNOSIS — C9030 Solitary plasmacytoma not having achieved remission: Secondary | ICD-10-CM | POA: Diagnosis not present

## 2019-08-08 ENCOUNTER — Ambulatory Visit
Admission: RE | Admit: 2019-08-08 | Discharge: 2019-08-08 | Disposition: A | Discharge status: Home / Self Care | Payer: No Typology Code available for payment source | Source: Ambulatory Visit | Attending: Radiation Oncology | Admitting: Radiation Oncology

## 2019-08-08 ENCOUNTER — Other Ambulatory Visit: Payer: Self-pay

## 2019-08-08 DIAGNOSIS — C9030 Solitary plasmacytoma not having achieved remission: Secondary | ICD-10-CM | POA: Diagnosis not present

## 2019-08-09 ENCOUNTER — Ambulatory Visit
Admission: RE | Admit: 2019-08-09 | Discharge: 2019-08-09 | Disposition: A | Discharge status: Home / Self Care | Payer: No Typology Code available for payment source | Source: Ambulatory Visit | Attending: Radiation Oncology | Admitting: Radiation Oncology

## 2019-08-09 ENCOUNTER — Other Ambulatory Visit: Payer: Self-pay

## 2019-08-09 DIAGNOSIS — C9030 Solitary plasmacytoma not having achieved remission: Secondary | ICD-10-CM | POA: Diagnosis not present

## 2019-08-12 ENCOUNTER — Other Ambulatory Visit: Payer: Self-pay

## 2019-08-12 ENCOUNTER — Ambulatory Visit
Admission: RE | Admit: 2019-08-12 | Discharge: 2019-08-12 | Disposition: A | Discharge status: Home / Self Care | Payer: No Typology Code available for payment source | Source: Ambulatory Visit | Attending: Radiation Oncology | Admitting: Radiation Oncology

## 2019-08-12 DIAGNOSIS — C9030 Solitary plasmacytoma not having achieved remission: Secondary | ICD-10-CM | POA: Diagnosis not present

## 2019-08-13 ENCOUNTER — Other Ambulatory Visit: Payer: Self-pay | Admitting: Radiation Oncology

## 2019-08-13 ENCOUNTER — Ambulatory Visit
Admission: RE | Admit: 2019-08-13 | Discharge: 2019-08-13 | Disposition: A | Discharge status: Home / Self Care | Payer: No Typology Code available for payment source | Source: Ambulatory Visit | Attending: Radiation Oncology | Admitting: Radiation Oncology

## 2019-08-13 ENCOUNTER — Other Ambulatory Visit: Payer: Self-pay

## 2019-08-13 DIAGNOSIS — C9030 Solitary plasmacytoma not having achieved remission: Secondary | ICD-10-CM | POA: Diagnosis not present

## 2019-08-13 MED ORDER — DEXAMETHASONE 4 MG PO TABS: 4.0000 mg | ORAL_TABLET | Freq: Every day | ORAL | 0 refills | Status: DC

## 2019-08-13 MED ORDER — HYDROCODONE-ACETAMINOPHEN 10-325 MG PO TABS: 1.0000 | ORAL_TABLET | Freq: Two times a day (BID) | ORAL | 0 refills | Status: DC | PRN

## 2019-08-14 ENCOUNTER — Other Ambulatory Visit: Payer: Self-pay

## 2019-08-14 ENCOUNTER — Ambulatory Visit
Admission: RE | Admit: 2019-08-14 | Discharge: 2019-08-14 | Disposition: A | Discharge status: Home / Self Care | Payer: No Typology Code available for payment source | Source: Ambulatory Visit | Attending: Radiation Oncology | Admitting: Radiation Oncology

## 2019-08-14 DIAGNOSIS — C9030 Solitary plasmacytoma not having achieved remission: Secondary | ICD-10-CM | POA: Diagnosis not present

## 2019-08-15 ENCOUNTER — Other Ambulatory Visit: Payer: Self-pay

## 2019-08-15 ENCOUNTER — Ambulatory Visit
Admission: RE | Admit: 2019-08-15 | Discharge: 2019-08-15 | Disposition: A | Discharge status: Home / Self Care | Payer: No Typology Code available for payment source | Source: Ambulatory Visit | Attending: Radiation Oncology | Admitting: Radiation Oncology

## 2019-08-15 DIAGNOSIS — C9030 Solitary plasmacytoma not having achieved remission: Secondary | ICD-10-CM | POA: Diagnosis not present

## 2019-08-16 ENCOUNTER — Other Ambulatory Visit: Payer: Self-pay

## 2019-08-16 ENCOUNTER — Ambulatory Visit
Admission: RE | Admit: 2019-08-16 | Discharge: 2019-08-16 | Disposition: A | Discharge status: Home / Self Care | Payer: No Typology Code available for payment source | Source: Ambulatory Visit | Attending: Radiation Oncology | Admitting: Radiation Oncology

## 2019-08-16 DIAGNOSIS — C9030 Solitary plasmacytoma not having achieved remission: Secondary | ICD-10-CM | POA: Diagnosis not present

## 2019-08-19 ENCOUNTER — Other Ambulatory Visit: Payer: Self-pay

## 2019-08-19 ENCOUNTER — Ambulatory Visit
Admission: RE | Admit: 2019-08-19 | Discharge: 2019-08-19 | Disposition: A | Discharge status: Home / Self Care | Payer: PRIVATE HEALTH INSURANCE | Source: Ambulatory Visit | Attending: Radiation Oncology | Admitting: Radiation Oncology

## 2019-08-19 DIAGNOSIS — C9030 Solitary plasmacytoma not having achieved remission: Secondary | ICD-10-CM | POA: Insufficient documentation

## 2019-08-19 DIAGNOSIS — Z510 Encounter for antineoplastic radiation therapy: Secondary | ICD-10-CM | POA: Insufficient documentation

## 2019-08-20 ENCOUNTER — Ambulatory Visit
Admission: RE | Admit: 2019-08-20 | Discharge: 2019-08-20 | Disposition: A | Discharge status: Home / Self Care | Payer: PRIVATE HEALTH INSURANCE | Source: Ambulatory Visit | Attending: Radiation Oncology | Admitting: Radiation Oncology

## 2019-08-20 ENCOUNTER — Other Ambulatory Visit: Payer: Self-pay | Admitting: Radiation Oncology

## 2019-08-20 ENCOUNTER — Other Ambulatory Visit: Payer: Self-pay

## 2019-08-20 DIAGNOSIS — Z510 Encounter for antineoplastic radiation therapy: Secondary | ICD-10-CM | POA: Diagnosis not present

## 2019-08-20 MED ORDER — GABAPENTIN 300 MG PO CAPS: 300.0000 mg | ORAL_CAPSULE | Freq: Every day | ORAL | 1 refills | Status: DC

## 2019-08-21 ENCOUNTER — Ambulatory Visit
Admission: RE | Admit: 2019-08-21 | Discharge: 2019-08-21 | Disposition: A | Discharge status: Home / Self Care | Payer: PRIVATE HEALTH INSURANCE | Source: Ambulatory Visit | Attending: Radiation Oncology | Admitting: Radiation Oncology

## 2019-08-21 ENCOUNTER — Other Ambulatory Visit: Payer: Self-pay

## 2019-08-21 DIAGNOSIS — Z510 Encounter for antineoplastic radiation therapy: Secondary | ICD-10-CM | POA: Diagnosis not present

## 2019-08-22 ENCOUNTER — Other Ambulatory Visit: Payer: Self-pay

## 2019-08-22 ENCOUNTER — Ambulatory Visit
Admission: RE | Admit: 2019-08-22 | Discharge: 2019-08-22 | Disposition: A | Discharge status: Home / Self Care | Payer: PRIVATE HEALTH INSURANCE | Source: Ambulatory Visit | Attending: Radiation Oncology | Admitting: Radiation Oncology

## 2019-08-22 ENCOUNTER — Encounter: Payer: Self-pay | Admitting: Radiation Oncology

## 2019-08-22 DIAGNOSIS — Z510 Encounter for antineoplastic radiation therapy: Secondary | ICD-10-CM | POA: Diagnosis not present

## 2019-09-11 NOTE — Progress Notes (Incomplete)
  Patient Name: Hannah Cooper MRN: [id number] DOB: 1955/10/11 Referring Physician: Delman Cheadle (Profile Not Attached) Date of Service: 08/22/2019 [facility], [place]                                                        End Of Treatment Note  Diagnoses: C90.30-Solitary plasmacytoma not having achieved remission  Cancer Staging: Plasmocytoma of L4 vertebral body  Intent: Curative  Radiation Treatment Dates: 07/16/2019 through 08/22/2019 Site Technique Total Dose (Gy) Dose per Fx (Gy) Completed Fx Beam Energies  Lumbar Spine: Spine 3D 50.4/50.4 1.8 28/28 15X   Narrative: The patient tolerated radiation therapy relatively well. She did report some mild to moderate fatigue in addition to poor appetite and nausea and vomiting that were relived with antiemetics. She also reported some improvement in her back pain in the beginning of treatment. However, the pain persisted (radiating down left leg) and she continued to have difficulty walking and did experience some falls at home. She was prescribed Decadron 4 mg daily but it did not provide her with any relief. She was then started on Neurontin 300 mg nightly. She denied diarrhea and difficulty swallowing.  Plan: The patient will follow-up with radiation oncology in one month.  ________________________________________________   Blair Promise, PhD, MD  This document serves as a record of services personally performed by Gery Pray, MD. It was created on his behalf by Clerance Lav, a trained medical scribe. The creation of this record is based on the scribe's personal observations and the provider's statements to them. This document has been checked and approved by the attending provider.

## 2019-09-25 NOTE — Progress Notes (Signed)
Radiation Oncology         ([phone]) [phone] ________________________________  Name: Hannah Cooper MRN: [id number]  Date: 09/26/2019  DOB: 11/29/1955  Follow-Up Visit Note  CC: Jake Samples, PA-C  Jake Samples, [place]*    ICD-10-CM   1. 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  Radiation Treatment Dates: 07/16/2019 through 08/22/2019 Site Technique Total Dose (Gy) Dose per Fx (Gy) Completed Fx Beam Energies  Lumbar Spine: Spine 3D 50.4/50.4 1.8 28/28 15X    Narrative:  The patient returns today for routine follow-up.   On review of systems, the patient reports low back pain, moderate fatigue, occasional constipation, and urinary urgency. The patient takes occasional hydrocodone for pain.  She does state that the Neurontin helped a lot with her pain but she felt like she was too sedated at night and is concerned she would not  hear her children did not wish to continue taking this medication.  She continues to have pain when standing long periods of time.  Overall she has had minimal improvement in her pain.  She denies any focal weakness in her lower extremities.  She denies any bowel or bladder incontinence or urinary retention.  She has noticed urinary urgency and we will therefore check a urinalysis culture and sensitivity later today.                     ALLERGIES:  is allergic to other.  Meds: Current Outpatient Medications  Medication Sig Dispense Refill  . acetaminophen (TYLENOL) 500 MG tablet Take 1,000 mg by mouth every 6 (six) hours as needed for headache (pain).     Marland Kitchen albuterol (PROVENTIL) (2.5 MG/3ML) 0.083% nebulizer solution Take 2.5 mg by nebulization every 6 (six) hours as needed for wheezing or shortness of breath.    . Albuterol Sulfate (PROAIR RESPICLICK) 403 (90 Base)  MCG/ACT AEPB Inhale 2 puffs into the lungs every 4 (four) hours as needed (shortness of breath/ wheezing).    Marland Kitchen aspirin EC 81 MG tablet Take 81 mg by mouth daily.    . chlorthalidone (HYGROTON) 25 MG tablet Take 1 tablet (25 mg total) by mouth daily. 90 tablet 3  . cycloSPORINE (RESTASIS) 0.05 % ophthalmic emulsion Place 1 drop into both eyes 2 (two) times daily.     Marland Kitchen dexamethasone (DECADRON) 4 MG tablet Take 1 tablet (4 mg total) by mouth daily. 30 tablet 0  . diltiazem (CARDIZEM) 30 MG tablet Take 1 tablet (30 mg total) by mouth 2 (two) times daily. 60 tablet 11  . furosemide (LASIX) 20 MG tablet Take 1 tablet daily as needed may take 2 tablets prn (Patient taking differently: Take 20 mg by mouth 2 (two) times daily as needed (fluid retention.). ) 60 tablet 1  . gabapentin (NEURONTIN) 300 MG capsule Take 1 capsule (300 mg total) by mouth at bedtime. 30 capsule 1  . ibuprofen (ADVIL,MOTRIN) 800 MG tablet Take 1 tablet by mouth 2 (two) times daily as needed (back pain).   2  . isosorbide mononitrate (IMDUR) 30 MG 24 hr tablet TAKE 1 TABLET (30 MG TOTAL) BY MOUTH DAILY. (Patient taking differently: Take 30 mg by mouth daily. ) 90 tablet 0  . metoprolol tartrate (LOPRESSOR) 100 MG tablet Take 100 mg by mouth 2 (two) times daily.    Marland Kitchen  Misc. Devices MISC SHOWER CHAIR 1 Device 0  . Misc. Devices MISC ELEVATED TOILET CHAIR 1 Device 0  . prochlorperazine (COMPAZINE) 10 MG tablet Take 1 tablet (10 mg total) by mouth every 6 (six) hours as needed for nausea or vomiting. 30 tablet 0  . valACYclovir (VALTREX) 1000 MG tablet Take 1 tablet (1,000 mg total) by mouth 3 (three) times daily. 21 tablet 0  . HYDROcodone-acetaminophen (NORCO) 10-325 MG tablet Take 1 tablet by mouth 2 (two) times daily as needed for moderate pain (pain.). 30 tablet 0   No current facility-administered medications for this encounter.    Physical Findings: The patient is in no acute distress. Patient is alert and oriented.  height  is 5\' 6"  (1.676 m) and weight is 229 lb (103.9 kg). Her temperature is 97.9 F (36.6 C). Her blood pressure is 127/80 and her pulse is 63. Her respiration is 20 and oxygen saturation is 98%.  No significant changes. Lungs are clear to auscultation bilaterally. Heart has regular rate and rhythm. No palpable cervical, supraclavicular, or axillary adenopathy. Abdomen soft, non-tender, normal bowel sounds.  Lower motor strength is 5 out of 5 in the proximal and distal muscle groups bilaterally.  Lab Findings: Lab Results  Component Value Date   WBC 3.9 (L) 07/08/2019   HGB 13.0 07/08/2019   HCT 39.9 07/08/2019   MCV 91.1 07/08/2019   PLT 245 07/08/2019    Radiographic Findings: No results found.  Impression: Plasmocytoma of L4 vertebral body  The patient is recovering from the effects of radiation.  She has had some improvement in her low back pain but not a significant change.  She is above takes hydrocodone intermittently for pain control as well as Tylenol and occasional nonsteroidal anti-inflammatory agents.  Hydrocodone prescription refilled today  Plan: The patient is scheduled to see Dr. Delton Coombes on 11/21/2019. She will follow-up with radiation oncology in September.  If Dr. Delton Coombes has not ordered a MRI of the lumbar spine then I will likely order this study at the time of her follow-up.  Addendum patient's urine analysis showed a significant elevated level of glucose at greater than 500.  The patient was informed of these results.  She does not report having history of diabetes.  She will be meeting with her primary care physician tomorrow for further evaluation of this issue..  ____________________________________  Blair Promise, PhD, MD  This document serves as a record of services personally performed by Gery Pray, MD. It was created on his behalf by Clerance Lav, a trained medical scribe. The creation of this record is based on the scribe's personal observations and  the provider's statements to them. This document has been checked and approved by the attending provider.

## 2019-09-26 ENCOUNTER — Ambulatory Visit
Admission: RE | Admit: 2019-09-26 | Discharge: 2019-09-26 | Disposition: A | Discharge status: Home / Self Care | Payer: PRIVATE HEALTH INSURANCE | Source: Ambulatory Visit | Attending: Radiation Oncology | Admitting: Radiation Oncology

## 2019-09-26 ENCOUNTER — Other Ambulatory Visit: Payer: Self-pay

## 2019-09-26 ENCOUNTER — Encounter (HOSPITAL_COMMUNITY): Payer: Self-pay | Admitting: Emergency Medicine

## 2019-09-26 ENCOUNTER — Encounter: Payer: Self-pay | Admitting: Radiation Oncology

## 2019-09-26 ENCOUNTER — Telehealth: Payer: Self-pay

## 2019-09-26 DIAGNOSIS — I110 Hypertensive heart disease with heart failure: Secondary | ICD-10-CM | POA: Diagnosis not present

## 2019-09-26 DIAGNOSIS — Z510 Encounter for antineoplastic radiation therapy: Secondary | ICD-10-CM | POA: Insufficient documentation

## 2019-09-26 DIAGNOSIS — R739 Hyperglycemia, unspecified: Secondary | ICD-10-CM | POA: Insufficient documentation

## 2019-09-26 DIAGNOSIS — I5032 Chronic diastolic (congestive) heart failure: Secondary | ICD-10-CM | POA: Diagnosis not present

## 2019-09-26 DIAGNOSIS — C9020 Extramedullary plasmacytoma not having achieved remission: Secondary | ICD-10-CM | POA: Insufficient documentation

## 2019-09-26 DIAGNOSIS — C9030 Solitary plasmacytoma not having achieved remission: Secondary | ICD-10-CM | POA: Insufficient documentation

## 2019-09-26 DIAGNOSIS — Z79899 Other long term (current) drug therapy: Secondary | ICD-10-CM | POA: Insufficient documentation

## 2019-09-26 DIAGNOSIS — R350 Frequency of micturition: Secondary | ICD-10-CM | POA: Diagnosis present

## 2019-09-26 LAB — URINALYSIS, COMPLETE (UACMP) WITH MICROSCOPIC
Bacteria, UA: NONE SEEN
Bilirubin Urine: NEGATIVE
Glucose, UA: >=500 mg/dL — AB
Hgb urine dipstick: NEGATIVE
Ketones, ur: 20 mg/dL — AB
Leukocytes,Ua: NEGATIVE
Nitrite: NEGATIVE
Protein, ur: NEGATIVE mg/dL
Specific Gravity, Urine: 1.033 — ABNORMAL HIGH (ref 1.005–1.030)
pH: 6 (ref 5.0–8.0)

## 2019-09-26 LAB — CBG MONITORING, ED: Glucose-Capillary: 569 mg/dL (ref 70–99)

## 2019-09-26 MED ORDER — HYDROCODONE-ACETAMINOPHEN 10-325 MG PO TABS: 1.0000 | ORAL_TABLET | Freq: Two times a day (BID) | ORAL | 0 refills | Status: DC | PRN

## 2019-09-26 NOTE — Telephone Encounter (Signed)
RN called patient to advise her of her Urinalysis results today per Dr. Clabe Seal request. Advised she does not have any infection in her urine but her glucose is greater than 500 mg/dL. Patient states she is not diabetic and has not been instructed to check her glucose level at home. She states she has a primary MD appointment tomorrow and will advise her MD of that value. Patient advised that her MD will probably do more testing to monitor her glucose. Pt. Verbalized  understanding.

## 2019-09-26 NOTE — ED Triage Notes (Signed)
Pt notified by [facility] this afternoon that her glucose was over 500. Pt called PCP and was order metformin but was unable to get the medication today. Called the nurse line and was told to come to the ED. CBG in triage is 569.

## 2019-09-26 NOTE — Progress Notes (Signed)
Patient here for a 1 month f/u visit with Dr. Sondra Come. Pt. Reports pain is a 6 in her lower back. Reports moderate fatigue and  urgency with urination.Uses a walker at home and gets very tired walking short distances. Reports has occ constipation. PT. States she has a dark spot on her abdomen and a blackhead under her left breast. BP 127/80 (BP Location: Left Arm, Patient Position: Sitting, Cuff Size: Normal)   Pulse 63   Temp 97.9 F (36.6 C)   Resp 20   Ht 5\' 6"  (1.676 m)   Wt 229 lb (103.9 kg)   SpO2 98%   BMI 36.96 kg/m    Wt Readings from Last 3 Encounters:  09/26/19 229 lb (103.9 kg)  07/22/19 241 lb 4.8 oz (109.5 kg)  07/12/19 240 lb (108.9 kg)

## 2019-09-27 ENCOUNTER — Other Ambulatory Visit: Payer: Self-pay

## 2019-09-27 ENCOUNTER — Emergency Department (HOSPITAL_COMMUNITY)
Admission: EM | Admit: 2019-09-27 | Discharge: 2019-09-27 | Disposition: A | Discharge status: Home / Self Care | Payer: No Typology Code available for payment source | Attending: Emergency Medicine | Admitting: Emergency Medicine

## 2019-09-27 DIAGNOSIS — R739 Hyperglycemia, unspecified: Secondary | ICD-10-CM | POA: Diagnosis not present

## 2019-09-27 LAB — CBC WITH DIFFERENTIAL/PLATELET
Abs Immature Granulocytes: 0.01 10*3/uL (ref 0.00–0.07)
Basophils Absolute: 0 10*3/uL (ref 0.0–0.1)
Basophils Relative: 0 %
Eosinophils Absolute: 0.1 10*3/uL (ref 0.0–0.5)
Eosinophils Relative: 2 %
HCT: 38.8 % (ref 36.0–46.0)
Hemoglobin: 13.2 g/dL (ref 12.0–15.0)
Immature Granulocytes: 0 %
Lymphocytes Relative: 28 %
Lymphs Abs: 0.9 10*3/uL (ref 0.7–4.0)
MCH: 30.6 pg (ref 26.0–34.0)
MCHC: 34 g/dL (ref 30.0–36.0)
MCV: 89.8 fL (ref 80.0–100.0)
Monocytes Absolute: 0.3 10*3/uL (ref 0.1–1.0)
Monocytes Relative: 10 %
Neutro Abs: 2 10*3/uL (ref 1.7–7.7)
Neutrophils Relative %: 60 %
Platelets: 242 10*3/uL (ref 150–400)
RBC: 4.32 MIL/uL (ref 3.87–5.11)
RDW: 13.2 % (ref 11.5–15.5)
WBC: 3.3 10*3/uL — ABNORMAL LOW (ref 4.0–10.5)
nRBC: 0 % (ref 0.0–0.2)

## 2019-09-27 LAB — URINALYSIS, ROUTINE W REFLEX MICROSCOPIC
Bilirubin Urine: NEGATIVE
Glucose, UA: >=500 mg/dL — AB
Hgb urine dipstick: NEGATIVE
Ketones, ur: 20 mg/dL — AB
Leukocytes,Ua: NEGATIVE
Nitrite: NEGATIVE
Protein, ur: NEGATIVE mg/dL
Specific Gravity, Urine: 1.035 — ABNORMAL HIGH (ref 1.005–1.030)
pH: 6 (ref 5.0–8.0)

## 2019-09-27 LAB — COMPREHENSIVE METABOLIC PANEL
ALT: 42 U/L (ref 0–44)
AST: 33 U/L (ref 15–41)
Albumin: 4.3 g/dL (ref 3.5–5.0)
Alkaline Phosphatase: 97 U/L (ref 38–126)
Anion gap: 14 (ref 5–15)
BUN: 18 mg/dL (ref 8–23)
CO2: 27 mmol/L (ref 22–32)
Calcium: 9.5 mg/dL (ref 8.9–10.3)
Chloride: 89 mmol/L — ABNORMAL LOW (ref 98–111)
Creatinine, Ser: 0.87 mg/dL (ref 0.44–1.00)
GFR calc Af Amer: >60 mL/min (ref >60)
GFR calc non Af Amer: >60 mL/min (ref >60)
Glucose, Bld: 493 mg/dL — ABNORMAL HIGH (ref 70–99)
Potassium: 3.2 mmol/L — ABNORMAL LOW (ref 3.5–5.1)
Sodium: 130 mmol/L — ABNORMAL LOW (ref 135–145)
Total Bilirubin: 0.7 mg/dL (ref 0.3–1.2)
Total Protein: 8.4 g/dL — ABNORMAL HIGH (ref 6.5–8.1)

## 2019-09-27 LAB — CBG MONITORING, ED
Glucose-Capillary: 407 mg/dL — ABNORMAL HIGH (ref 70–99)
Glucose-Capillary: 401 mg/dL — ABNORMAL HIGH (ref 70–99)
Glucose-Capillary: 375 mg/dL — ABNORMAL HIGH (ref 70–99)

## 2019-09-27 LAB — URINE CULTURE: Culture: <10000 — AB

## 2019-09-27 LAB — TROPONIN I (HIGH SENSITIVITY): Troponin I (High Sensitivity): 7 ng/L (ref <18)

## 2019-09-27 LAB — LIPASE, BLOOD: Lipase: 36 U/L (ref 11–51)

## 2019-09-27 MED ORDER — METFORMIN HCL 500 MG PO TABS
500.0000 mg | ORAL_TABLET | Freq: Two times a day (BID) | ORAL | 0 refills | Status: DC
Start: 2019-09-27 — End: 2019-09-29

## 2019-09-27 MED ORDER — SODIUM CHLORIDE 0.9 % IV BOLUS
1000.0000 mL | Freq: Once | INTRAVENOUS | Status: AC
  Administered 2019-09-27: 1000 mL via INTRAVENOUS

## 2019-09-27 MED ORDER — INSULIN ASPART 100 UNIT/ML ~~LOC~~ SOLN
8.0000 [IU] | Freq: Once | SUBCUTANEOUS | Status: AC
  Administered 2019-09-27: 8 [IU] via SUBCUTANEOUS
  Filled 2019-09-27: qty 1

## 2019-09-27 MED ORDER — INSULIN ASPART 100 UNIT/ML ~~LOC~~ SOLN
5.0000 [IU] | Freq: Once | SUBCUTANEOUS | Status: AC
  Administered 2019-09-27: 5 [IU] via SUBCUTANEOUS
  Filled 2019-09-27: qty 1

## 2019-09-27 MED ORDER — FREESTYLE SYSTEM KIT: 1.0000 | PACK | 0 refills | Status: DC | PRN

## 2019-09-27 NOTE — Discharge Instructions (Signed)
Take the Metformin as prescribed.  Check your blood sugar at least twice daily and keep a record of these levels.  Follow-up with your doctor tomorrow as scheduled.  Return to the ED if develop chest pain, shortness of breath, fever, vomiting or other concerns

## 2019-09-27 NOTE — ED Provider Notes (Signed)
[facility] EMERGENCY DEPARTMENT Provider Note   CSN: [id number] Arrival date & time: 09/26/19  2243     History Chief Complaint  Patient presents with  . Hyperglycemia    Hannah Cooper is a 64 y.o. female.  Patient with history of plasmacytoma, COPD, CHF sent from the cancer center with hyperglycemia.  States she was there for routine follow-up and mentioned that she was having several weeks of increased thirst and urinary frequency.  Urinalysis is negative for UTI.  She was called this evening and told she had blood sugar greater than 500 as well as some sugar in her urine.  She has no history of diabetes.  She has no fever, chills, nausea or vomiting.  No abdominal pain.  No dizziness or lightheadedness.  Has had several weeks of urinary urgency, frequency and increased thirst.  No episodes of incontinence.  She was prescribed Metformin but was not able to fill this at the pharmacy was closed and was referred to the ED. Chart review shows she has been taking Decadron for her cancer, she is not certain if she is still taking this.  The history is provided by the patient and the spouse.  Hyperglycemia Associated symptoms: dysuria, fatigue and weakness   Associated symptoms: no abdominal pain, no chest pain, no dizziness, no fever, no nausea, no shortness of breath and no vomiting        Past Medical History:  Diagnosis Date  . Arthritis   . Atypical chest pain    chronic  . Chronic back pain   . Chronic diastolic CHF (congestive heart failure) ([place])   . COPD (chronic obstructive pulmonary disease) ([place])   . DDD (degenerative disc disease), cervical   . Hyperlipidemia   . Hypertension   . Lumbar radiculopathy   . Meningitis   . Normal coronary arteries 2014  . Palpitations   . Premature atrial contractions   . PVC's (premature ventricular contractions)     Patient Active Problem List   Diagnosis Date Noted  . 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[place])  07/10/2019  . Plasma cell disorder 06/10/2019  . Lesion of bone of lumbosacral spine 05/28/2019  . Left-sided weakness 03/13/2016  . Cerebrovascular accident (CVA) ([place])   . Palpitations   . COPD (chronic obstructive pulmonary disease) ([place]) 08/20/2014  . Dyspnea 12/19/2013  . Chronic diastolic CHF (congestive heart failure) ([place]) 07/05/2013  . Lower extremity edema 07/05/2013  . Hyperlipidemia   . Chest pain 07/04/2013  . Hypokalemia 09/04/2012  . Precordial pain 09/04/2012  . Viral meningitis 01/22/2011  . Vomiting 01/22/2011  . PNEUMONIA, LEFT LOWER LOBE 10/10/2006  . DISEASE, ACUTE BRONCHOSPASM 10/10/2006  . Essential hypertension 05/23/2006  . OSTEOARTHRITIS 05/23/2006    Past Surgical History:  Procedure Laterality Date  . ABDOMINAL HYSTERECTOMY    . IR FLUORO GUIDED NEEDLE PLC ASPIRATION/INJECTION LOC  06/19/2019  . LEFT HEART CATHETERIZATION WITH CORONARY ANGIOGRAM N/A 09/05/2012   Procedure: LEFT HEART CATHETERIZATION WITH CORONARY ANGIOGRAM;  Surgeon: Wellington Hampshire, MD;  Location: [facility];  Service: Cardiovascular;  Laterality: N/A;  . THYROID SURGERY  2002     OB History    Gravida      Para      Term      Preterm      AB      Living  0     SAB      TAB      Ectopic      Multiple  Live Births              Family History  Problem Relation Age of Onset  . Heart attack Mother 61  . Heart attack Sister 101    Social History   Tobacco Use  . Smoking status: Never Smoker  . Smokeless tobacco: Never Used  Vaping Use  . Vaping Use: Never used  Substance Use Topics  . Alcohol use: No    Alcohol/week: 0.0 standard drinks  . Drug use: No    Home Medications Prior to Admission medications   Medication Sig Start Date End Date Taking? Authorizing Provider  acetaminophen (TYLENOL) 500 MG tablet Take 1,000 mg by mouth every 6 (six) hours as needed for headache (pain).     [provider]  albuterol (PROVENTIL) (2.5 MG/3ML)  0.083% nebulizer solution Take 2.5 mg by nebulization every 6 (six) hours as needed for wheezing or shortness of breath.    [provider]  Albuterol Sulfate (PROAIR RESPICLICK) 299 (90 Base) MCG/ACT AEPB Inhale 2 puffs into the lungs every 4 (four) hours as needed (shortness of breath/ wheezing).    [provider]  aspirin EC 81 MG tablet Take 81 mg by mouth daily.    [provider]  chlorthalidone (HYGROTON) 25 MG tablet Take 1 tablet (25 mg total) by mouth daily. 07/12/19 10/10/19  Arnoldo Lenis, MD  cycloSPORINE (RESTASIS) 0.05 % ophthalmic emulsion Place 1 drop into both eyes 2 (two) times daily.     [provider]  dexamethasone (DECADRON) 4 MG tablet Take 1 tablet (4 mg total) by mouth daily. 08/13/19   Gery Pray, MD  diltiazem (CARDIZEM) 30 MG tablet Take 1 tablet (30 mg total) by mouth 2 (two) times daily. 06/24/19   Arnoldo Lenis, MD  furosemide (LASIX) 20 MG tablet Take 1 tablet daily as needed may take 2 tablets prn Patient taking differently: Take 20 mg by mouth 2 (two) times daily as needed (fluid retention.).  11/10/16   Arnoldo Lenis, MD  gabapentin (NEURONTIN) 300 MG capsule Take 1 capsule (300 mg total) by mouth at bedtime. 08/20/19   Gery Pray, MD  HYDROcodone-acetaminophen (NORCO) 10-325 MG tablet Take 1 tablet by mouth 2 (two) times daily as needed for moderate pain (pain.). 09/26/19   Gery Pray, MD  ibuprofen (ADVIL,MOTRIN) 800 MG tablet Take 1 tablet by mouth 2 (two) times daily as needed (back pain).  08/01/14   [provider]  isosorbide mononitrate (IMDUR) 30 MG 24 hr tablet TAKE 1 TABLET (30 MG TOTAL) BY MOUTH DAILY. Patient taking differently: Take 30 mg by mouth daily.  04/19/17   Satira Sark, MD  metoprolol tartrate (LOPRESSOR) 100 MG tablet Take 100 mg by mouth 2 (two) times daily.    [provider]  Misc. Devices MISC SHOWER CHAIR 06/14/19   Lockamy, Randi L, NP-C  Misc. Devices MISC  ELEVATED TOILET CHAIR 06/14/19   Lockamy, Randi L, NP-C  prochlorperazine (COMPAZINE) 10 MG tablet Take 1 tablet (10 mg total) by mouth every 6 (six) hours as needed for nausea or vomiting. 07/18/19   Gery Pray, MD  valACYclovir (VALTREX) 1000 MG tablet Take 1 tablet (1,000 mg total) by mouth 3 (three) times daily. 06/17/19   Derek Jack, MD    Allergies    Other  Review of Systems   Review of Systems  Constitutional: Positive for activity change, appetite change and fatigue. Negative for fever.  HENT: Negative for congestion and rhinorrhea.  Eyes: Negative for visual disturbance.  Respiratory: Negative for cough, chest tightness and shortness of breath.   Cardiovascular: Negative for chest pain and leg swelling.  Gastrointestinal: Negative for abdominal pain, nausea and vomiting.  Genitourinary: Positive for dysuria, frequency and urgency.  Skin: Negative for rash.  Neurological: Positive for weakness. Negative for dizziness and headaches.   all other systems are negative except as noted in the HPI and PMH.    Physical Exam Updated Vital Signs BP (!) 164/103   Pulse 74   Temp 98.2 F (36.8 C) (Oral)   Resp (!) 23   Ht 5\' 6"  (1.676 m)   Wt 104 kg   SpO2 94%   BMI 37.01 kg/m   Physical Exam Vitals and nursing note reviewed.  Constitutional:      General: She is not in acute distress.    Appearance: She is well-developed.  HENT:     Head: Normocephalic and atraumatic.     Mouth/Throat:     Mouth: Mucous membranes are dry.     Pharynx: No oropharyngeal exudate.  Eyes:     Conjunctiva/sclera: Conjunctivae normal.     Pupils: Pupils are equal, round, and reactive to light.  Neck:     Comments: No meningismus. Cardiovascular:     Rate and Rhythm: Normal rate and regular rhythm.     Heart sounds: Normal heart sounds. No murmur heard.   Pulmonary:     Effort: Pulmonary effort is normal. No respiratory distress.     Breath sounds: Normal breath sounds.    Abdominal:     Palpations: Abdomen is soft.     Tenderness: There is no abdominal tenderness. There is no guarding or rebound.  Musculoskeletal:        General: No tenderness. Normal range of motion.     Cervical back: Normal range of motion and neck supple.     Comments: No CVA tenderness  Skin:    General: Skin is warm.  Neurological:     Mental Status: She is alert and oriented to person, place, and time.     Cranial Nerves: No cranial nerve deficit.     Motor: No abnormal muscle tone.     Coordination: Coordination normal.     Comments: No ataxia on finger to nose bilaterally. No pronator drift. 5/5 strength throughout. CN 2-12 intact.Equal grip strength. Sensation intact.   Psychiatric:        Behavior: Behavior normal.     ED Results / Procedures / Treatments   Labs (all labs ordered are listed, but only abnormal results are displayed) Labs Reviewed  CBC WITH DIFFERENTIAL/PLATELET - Abnormal; Notable for the following components:      Result Value   WBC 3.3 (*)    All other components within normal limits  COMPREHENSIVE METABOLIC PANEL - Abnormal; Notable for the following components:   Sodium 130 (*)    Potassium 3.2 (*)    Chloride 89 (*)    Glucose, Bld 493 (*)    Total Protein 8.4 (*)    All other components within normal limits  URINALYSIS, ROUTINE W REFLEX MICROSCOPIC - Abnormal; Notable for the following components:   Specific Gravity, Urine 1.035 (*)    Glucose, UA >=500 (*)    Ketones, ur 20 (*)    Bacteria, UA RARE (*)    All other components within normal limits  CBG MONITORING, ED - Abnormal; Notable for the following components:   Glucose-Capillary 569 (*)    All other  components within normal limits  CBG MONITORING, ED - Abnormal; Notable for the following components:   Glucose-Capillary 407 (*)    All other components within normal limits  CBG MONITORING, ED - Abnormal; Notable for the following components:   Glucose-Capillary 401 (*)    All  other components within normal limits  CBG MONITORING, ED - Abnormal; Notable for the following components:   Glucose-Capillary 375 (*)    All other components within normal limits  LIPASE, BLOOD  CBG MONITORING, ED  TROPONIN I (HIGH SENSITIVITY)    EKG EKG Interpretation  Date/Time:  Friday September 27 2019 00:23:22 EDT Ventricular Rate:  71 PR Interval:    QRS Duration: 66 QT Interval:  429 QTC Calculation: 467 R Axis:   1 Text Interpretation: Sinus rhythm Borderline T abnormalities, diffuse leads Baseline wander in lead(s) V3 No significant change was found T wave changes have improved Confirmed by Ezequiel Essex [phone]) on 09/27/2019 2:09:48 AM   Radiology No results found.  Procedures .Critical Care Performed by: Ezequiel Essex, MD Authorized by: Ezequiel Essex, MD   Critical care provider statement:    Critical care time (minutes):  45   Critical care was necessary to treat or prevent imminent or life-threatening deterioration of the following conditions:  Endocrine crisis (hyperglycemia)   Critical care was time spent personally by me on the following activities:  Discussions with consultants, evaluation of patient's response to treatment, examination of patient, ordering and performing treatments and interventions, ordering and review of laboratory studies, ordering and review of radiographic studies, pulse oximetry, re-evaluation of patient's condition, obtaining history from patient or surrogate and review of old charts   (including critical care time)  Medications Ordered in ED Medications  sodium chloride 0.9 % bolus 1,000 mL (1,000 mLs Intravenous New Bag/Given 09/27/19 0130)  insulin aspart (novoLOG) injection 8 Units (8 Units Subcutaneous Given 09/27/19 0131)    ED Course  I have reviewed the triage vital signs and the nursing notes.  Pertinent labs & imaging results that were available during my care of the patient were reviewed by me and considered in my  medical decision making (see chart for details).    MDM Rules/Calculators/A&P                         Hyperglycemia, no history of diabetes.  Recent Decadron use for her cancer.  She is uncertain if she is still on this but does not think so.  Hemodynamically stable on arrival with normal anion gap.  She is hydrated and given insulin  Normal anion gap.  No evidence of DKA.  Patient given IV fluids and insulin.  Blood sugar has improved to 375. UA with small ketones.  Patient tolerating p.o. and abdomen soft.  Normal p.o. intake.  No infectious symptoms.  Patient states she has a Metformin prescription waiting for her and a PCP appointment tomorrow.  Will give glucometer, Metformin, blood sugar monitoring and PCP follow-up.  Patient instructed on starting Metformin and checking her blood sugars and at least twice daily basis.  She is aware of to pick up the prescription tomorrow.  Suspect she likely has steroid-induced hyperglycemia.  No evidence of DKA at this time.  Follow-up with PCP.  Return precautions discussed. Final Clinical Impression(s) / ED Diagnoses Final diagnoses:  Hyperglycemia    Rx / DC Orders ED Discharge Orders    None       Palin Tristan, Annie Main, MD 09/27/19 [phone]

## 2019-09-29 ENCOUNTER — Emergency Department (HOSPITAL_COMMUNITY)
Admission: EM | Admit: 2019-09-29 | Discharge: 2019-09-29 | Disposition: A | Discharge status: Home / Self Care | Payer: PRIVATE HEALTH INSURANCE | Attending: Emergency Medicine | Admitting: Emergency Medicine

## 2019-09-29 ENCOUNTER — Other Ambulatory Visit: Payer: Self-pay

## 2019-09-29 ENCOUNTER — Encounter (HOSPITAL_COMMUNITY): Payer: Self-pay | Admitting: *Deleted

## 2019-09-29 DIAGNOSIS — Z7984 Long term (current) use of oral hypoglycemic drugs: Secondary | ICD-10-CM | POA: Insufficient documentation

## 2019-09-29 DIAGNOSIS — Z79899 Other long term (current) drug therapy: Secondary | ICD-10-CM | POA: Insufficient documentation

## 2019-09-29 DIAGNOSIS — I5032 Chronic diastolic (congestive) heart failure: Secondary | ICD-10-CM | POA: Diagnosis not present

## 2019-09-29 DIAGNOSIS — J449 Chronic obstructive pulmonary disease, unspecified: Secondary | ICD-10-CM | POA: Diagnosis not present

## 2019-09-29 DIAGNOSIS — Z7982 Long term (current) use of aspirin: Secondary | ICD-10-CM | POA: Diagnosis not present

## 2019-09-29 DIAGNOSIS — I110 Hypertensive heart disease with heart failure: Secondary | ICD-10-CM | POA: Insufficient documentation

## 2019-09-29 DIAGNOSIS — R739 Hyperglycemia, unspecified: Secondary | ICD-10-CM | POA: Diagnosis present

## 2019-09-29 LAB — CBC
HCT: 38.5 % (ref 36.0–46.0)
Hemoglobin: 13.2 g/dL (ref 12.0–15.0)
MCH: 30.3 pg (ref 26.0–34.0)
MCHC: 34.3 g/dL (ref 30.0–36.0)
MCV: 88.5 fL (ref 80.0–100.0)
Platelets: 250 10*3/uL (ref 150–400)
RBC: 4.35 MIL/uL (ref 3.87–5.11)
RDW: 13.2 % (ref 11.5–15.5)
WBC: 3.6 10*3/uL — ABNORMAL LOW (ref 4.0–10.5)
nRBC: 0 % (ref 0.0–0.2)

## 2019-09-29 LAB — BASIC METABOLIC PANEL
Anion gap: 11 (ref 5–15)
BUN: 13 mg/dL (ref 8–23)
CO2: 25 mmol/L (ref 22–32)
Calcium: 8.9 mg/dL (ref 8.9–10.3)
Chloride: 99 mmol/L (ref 98–111)
Creatinine, Ser: 0.86 mg/dL (ref 0.44–1.00)
GFR calc Af Amer: >60 mL/min (ref >60)
GFR calc non Af Amer: >60 mL/min (ref >60)
Glucose, Bld: 366 mg/dL — ABNORMAL HIGH (ref 70–99)
Potassium: 3.3 mmol/L — ABNORMAL LOW (ref 3.5–5.1)
Sodium: 135 mmol/L (ref 135–145)

## 2019-09-29 LAB — URINALYSIS, ROUTINE W REFLEX MICROSCOPIC
Bilirubin Urine: NEGATIVE
Glucose, UA: >=500 mg/dL — AB
Hgb urine dipstick: NEGATIVE
Ketones, ur: 20 mg/dL — AB
Leukocytes,Ua: NEGATIVE
Nitrite: NEGATIVE
Protein, ur: 100 mg/dL — AB
Specific Gravity, Urine: 1.024 (ref 1.005–1.030)
pH: 6 (ref 5.0–8.0)

## 2019-09-29 LAB — CBG MONITORING, ED
Glucose-Capillary: 350 mg/dL — ABNORMAL HIGH (ref 70–99)
Glucose-Capillary: 286 mg/dL — ABNORMAL HIGH (ref 70–99)
Glucose-Capillary: 234 mg/dL — ABNORMAL HIGH (ref 70–99)

## 2019-09-29 MED ORDER — SODIUM CHLORIDE 0.9 % IV BOLUS
1000.0000 mL | Freq: Once | INTRAVENOUS | Status: AC
  Administered 2019-09-29: 1000 mL via INTRAVENOUS

## 2019-09-29 MED ORDER — INSULIN ASPART 100 UNIT/ML ~~LOC~~ SOLN
6.0000 [IU] | Freq: Once | SUBCUTANEOUS | Status: AC
  Administered 2019-09-29: 6 [IU] via SUBCUTANEOUS
  Filled 2019-09-29: qty 1

## 2019-09-29 NOTE — ED Provider Notes (Signed)
[facility] EMERGENCY DEPARTMENT Provider Note   CSN: [id number] Arrival date & time: 09/29/19  1303     History Chief Complaint  Patient presents with  . Hyperglycemia    Hannah Cooper is a 64 y.o. female with past medical history as outlined below, including HTN, COPD, CHF and undergoing treatment for a plasmacytoma of her L4 lumbar spine, with new onset diabetes, presenting with elevated blood glucose and blurred vision. She was at church today when she started to not feel well, noticed she was having blurred vision and a friend checked her cbg for her which was 411.  She endorses increased thirst and urinary frequency.  She has been placed on a combination glipizide/metformin 5/500 tablet bid and has been taking her medicine as prescribed.  She and husband have also been trying to adjust diet to better control her glucose but would welcome some guidance with this. She denies abdominal pain, n/v, chest pain, sob, also no focal weakness.  HPI     Past Medical History:  Diagnosis Date  . Arthritis   . Atypical chest pain    chronic  . Chronic back pain   . Chronic diastolic CHF (congestive heart failure) ([place])   . COPD (chronic obstructive pulmonary disease) ([place])   . DDD (degenerative disc disease), cervical   . Hyperlipidemia   . Hypertension   . Lumbar radiculopathy   . Meningitis   . Normal coronary arteries 2014  . Palpitations   . Premature atrial contractions   . PVC's (premature ventricular contractions)     Patient Active Problem List   Diagnosis Date Noted  . 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[place]) 07/10/2019  . Plasma cell disorder 06/10/2019  . Lesion of bone of lumbosacral spine 05/28/2019  . Left-sided weakness 03/13/2016  . Cerebrovascular accident (CVA) ([place])   . Palpitations   . COPD (chronic obstructive pulmonary disease) ([place]) 08/20/2014  . Dyspnea 12/19/2013  . Chronic diastolic CHF (congestive heart failure) ([place]) 07/05/2013  . Lower  extremity edema 07/05/2013  . Hyperlipidemia   . Chest pain 07/04/2013  . Hypokalemia 09/04/2012  . Precordial pain 09/04/2012  . Viral meningitis 01/22/2011  . Vomiting 01/22/2011  . PNEUMONIA, LEFT LOWER LOBE 10/10/2006  . DISEASE, ACUTE BRONCHOSPASM 10/10/2006  . Essential hypertension 05/23/2006  . OSTEOARTHRITIS 05/23/2006    Past Surgical History:  Procedure Laterality Date  . ABDOMINAL HYSTERECTOMY    . IR FLUORO GUIDED NEEDLE PLC ASPIRATION/INJECTION LOC  06/19/2019  . LEFT HEART CATHETERIZATION WITH CORONARY ANGIOGRAM N/A 09/05/2012   Procedure: LEFT HEART CATHETERIZATION WITH CORONARY ANGIOGRAM;  Surgeon: Wellington Hampshire, MD;  Location: [facility];  Service: Cardiovascular;  Laterality: N/A;  . THYROID SURGERY  2002     OB History    Gravida      Para      Term      Preterm      AB      Living  0     SAB      TAB      Ectopic      Multiple      Live Births              Family History  Problem Relation Age of Onset  . Heart attack Mother 36  . Heart attack Sister 49    Social History   Tobacco Use  . Smoking status: Never Smoker  . Smokeless tobacco: Never Used  Vaping Use  . Vaping Use: Never  used  Substance Use Topics  . Alcohol use: No    Alcohol/week: 0.0 standard drinks  . Drug use: No    Home Medications Prior to Admission medications   Medication Sig Start Date End Date Taking? Authorizing Provider  acetaminophen (TYLENOL) 500 MG tablet Take 1,000 mg by mouth every 6 (six) hours as needed for headache (pain).    Yes [provider]  albuterol (PROVENTIL) (2.5 MG/3ML) 0.083% nebulizer solution Take 2.5 mg by nebulization every 6 (six) hours as needed for wheezing or shortness of breath.   Yes [provider]  Albuterol Sulfate (PROAIR RESPICLICK) 401 (90 Base) MCG/ACT AEPB Inhale 2 puffs into the lungs every 4 (four) hours as needed (shortness of breath/ wheezing).   Yes [provider]  aspirin EC 81  MG tablet Take 81 mg by mouth daily.   Yes [provider]  chlorthalidone (HYGROTON) 25 MG tablet Take 1 tablet (25 mg total) by mouth daily. 07/12/19 10/10/19 Yes Branch, Alphonse Guild, MD  cycloSPORINE (RESTASIS) 0.05 % ophthalmic emulsion Place 1 drop into both eyes 2 (two) times daily.    Yes [provider]  dexamethasone (DECADRON) 4 MG tablet Take 1 tablet (4 mg total) by mouth daily. 08/13/19  Yes Gery Pray, MD  diltiazem (CARDIZEM) 30 MG tablet Take 1 tablet (30 mg total) by mouth 2 (two) times daily. 06/24/19  Yes Branch, Alphonse Guild, MD  furosemide (LASIX) 20 MG tablet Take 1 tablet daily as needed may take 2 tablets prn Patient taking differently: Take 20 mg by mouth 2 (two) times daily as needed (fluid retention.).  11/10/16  Yes Branch, Alphonse Guild, MD  gabapentin (NEURONTIN) 300 MG capsule Take 1 capsule (300 mg total) by mouth at bedtime. 08/20/19  Yes Gery Pray, MD  glipiZIDE-metformin (METAGLIP) 5-500 MG tablet Take 1 tablet by mouth in the morning and at bedtime. 09/27/19  Yes [provider]  glucose monitoring kit (FREESTYLE) monitoring kit 1 each by Does not apply route as needed for other. 09/27/19  Yes Rancour, Annie Main, MD  HYDROcodone-acetaminophen (NORCO) 10-325 MG tablet Take 1 tablet by mouth 2 (two) times daily as needed for moderate pain (pain.). 09/26/19  Yes Gery Pray, MD  ibuprofen (ADVIL,MOTRIN) 800 MG tablet Take 1 tablet by mouth 2 (two) times daily as needed (back pain).  08/01/14  Yes [provider]  isosorbide mononitrate (IMDUR) 30 MG 24 hr tablet TAKE 1 TABLET (30 MG TOTAL) BY MOUTH DAILY. Patient taking differently: Take 30 mg by mouth daily.  04/19/17  Yes Satira Sark, MD  metoprolol tartrate (LOPRESSOR) 100 MG tablet Take 100 mg by mouth 2 (two) times daily.   Yes [provider]  Misc. Devices MISC SHOWER CHAIR 06/14/19  Yes Lockamy, Randi L, NP-C  Misc. Devices MISC ELEVATED TOILET CHAIR 06/14/19  Yes Lockamy,  Randi L, NP-C  mupirocin ointment (BACTROBAN) 2 % Apply 1 application topically 3 (three) times daily as needed. 09/20/19  Yes [provider]  prochlorperazine (COMPAZINE) 10 MG tablet Take 1 tablet (10 mg total) by mouth every 6 (six) hours as needed for nausea or vomiting. 07/18/19  Yes Gery Pray, MD    Allergies    Other  Review of Systems   Review of Systems  Constitutional: Negative for chills and fever.  HENT: Negative.   Eyes: Positive for visual disturbance.  Respiratory: Negative for chest tightness and shortness of breath.   Cardiovascular: Negative for chest pain.  Gastrointestinal: Negative for abdominal pain,  nausea and vomiting.  Endocrine: Negative for polydipsia and polyuria.  Genitourinary: Positive for frequency. Negative for dysuria.  Musculoskeletal: Positive for back pain.  Skin: Negative.  Negative for rash and wound.  Neurological: Negative.   Psychiatric/Behavioral: Negative.     Physical Exam Updated Vital Signs BP 130/74   Pulse 65   Temp 98.4 F (36.9 C) (Oral)   Resp 18   Ht 5' 5"  (1.651 m)   Wt 104.3 kg   SpO2 96%   BMI 38.27 kg/m   Physical Exam Vitals and nursing note reviewed.  Constitutional:      Appearance: She is well-developed.  HENT:     Head: Normocephalic and atraumatic.     Mouth/Throat:     Mouth: Mucous membranes are dry.  Eyes:     Conjunctiva/sclera: Conjunctivae normal.  Cardiovascular:     Rate and Rhythm: Normal rate and regular rhythm.     Heart sounds: Normal heart sounds.  Pulmonary:     Effort: Pulmonary effort is normal.     Breath sounds: Normal breath sounds. No wheezing.  Abdominal:     General: Bowel sounds are normal.     Palpations: Abdomen is soft.     Tenderness: There is no abdominal tenderness. There is no guarding.  Musculoskeletal:        General: Normal range of motion.     Cervical back: Normal range of motion.  Skin:    General: Skin is warm and dry.  Neurological:     Mental  Status: She is alert.     ED Results / Procedures / Treatments   Labs (all labs ordered are listed, but only abnormal results are displayed) Labs Reviewed  BASIC METABOLIC PANEL - Abnormal; Notable for the following components:      Result Value   Potassium 3.3 (*)    Glucose, Bld 366 (*)    All other components within normal limits  CBC - Abnormal; Notable for the following components:   WBC 3.6 (*)    All other components within normal limits  URINALYSIS, ROUTINE W REFLEX MICROSCOPIC - Abnormal; Notable for the following components:   Glucose, UA >=500 (*)    Ketones, ur 20 (*)    Protein, ur 100 (*)    Bacteria, UA FEW (*)    All other components within normal limits  CBG MONITORING, ED - Abnormal; Notable for the following components:   Glucose-Capillary 350 (*)    All other components within normal limits  CBG MONITORING, ED - Abnormal; Notable for the following components:   Glucose-Capillary 286 (*)    All other components within normal limits  CBG MONITORING, ED - Abnormal; Notable for the following components:   Glucose-Capillary 234 (*)    All other components within normal limits  CBG MONITORING, ED    EKG None  Radiology No results found.  Procedures Procedures (including critical care time)  Medications Ordered in ED Medications  sodium chloride 0.9 % bolus 1,000 mL (0 mLs Intravenous Stopped 09/29/19 1529)  insulin aspart (novoLOG) injection 6 Units (6 Units Subcutaneous Given 09/29/19 1426)  sodium chloride 0.9 % bolus 1,000 mL (0 mLs Intravenous Stopped 09/29/19 1818)    ED Course  I have reviewed the triage vital signs and the nursing notes.  Pertinent labs & imaging results that were available during my care of the patient were reviewed by me and considered in my medical decision making (see chart for details).    MDM Rules/Calculators/A&P  Labs reviewed. Pt was given IV fluids 2 L along with SQ aspart insulin with  significant improvement in her cbg level.  No vision complaint at dc.  No dka/acidosis.  She was given general DM information and info re carb counting.  Also a referral was made for outpatient dietician consult for this patient regarding management of DM and food choices.  PRN f/u also with pcp (pt states she plans phone contact with pcp this week).  The patient appears reasonably screened and/or stabilized for discharge and I doubt any other medical condition or other [facility] requiring further screening, evaluation, or treatment in the ED at this time prior to discharge.  Final Clinical Impression(s) / ED Diagnoses Final diagnoses:  Hyperglycemia    Rx / DC Orders ED Discharge Orders         Ordered    Ambulatory referral to Nutrition and Diabetic Education     Discontinue  Reprint    Comments: Pt is a new diabetic and would benefit from diabetes food and nutrition class.   09/29/19 1757           Evalee Jefferson, PA-C 09/29/19 2210    Noemi Chapel, MD 10/11/19 1451

## 2019-09-29 NOTE — Discharge Instructions (Addendum)
Your lab tests today are reassuring and your blood glucose level has improved with your last check at 234.  Continue taking your home medications with your evening dose this evening.  You have been referred to the nutrition and diabetic education department here at the hospital who should be contacting you to arrange an educational class to help you with your diabetes management and dietary choices.  I have printed some information for you to read while you are waiting this class.

## 2019-09-29 NOTE — ED Triage Notes (Signed)
Pt states her blood sugar is high after church and eating lunch.  States cbg was 411, pt on oral agents.

## 2019-09-29 NOTE — ED Notes (Signed)
E-signature pad not working in room 1, pt verbalized understanding of her d/c instructions.

## 2019-11-06 ENCOUNTER — Other Ambulatory Visit (HOSPITAL_COMMUNITY): Payer: Self-pay | Admitting: *Deleted

## 2019-11-06 DIAGNOSIS — M899 Disorder of bone, unspecified: Secondary | ICD-10-CM

## 2019-11-07 ENCOUNTER — Inpatient Hospital Stay (HOSPITAL_COMMUNITY): Payer: PRIVATE HEALTH INSURANCE | Attending: Hematology

## 2019-11-07 ENCOUNTER — Other Ambulatory Visit: Payer: Self-pay

## 2019-11-07 DIAGNOSIS — Z79899 Other long term (current) drug therapy: Secondary | ICD-10-CM | POA: Insufficient documentation

## 2019-11-07 DIAGNOSIS — C9030 Solitary plasmacytoma not having achieved remission: Secondary | ICD-10-CM | POA: Diagnosis present

## 2019-11-07 DIAGNOSIS — M899 Disorder of bone, unspecified: Secondary | ICD-10-CM

## 2019-11-07 DIAGNOSIS — D729 Disorder of white blood cells, unspecified: Secondary | ICD-10-CM

## 2019-11-07 DIAGNOSIS — Z7982 Long term (current) use of aspirin: Secondary | ICD-10-CM | POA: Diagnosis not present

## 2019-11-07 LAB — COMPREHENSIVE METABOLIC PANEL
ALT: 38 U/L (ref 0–44)
AST: 26 U/L (ref 15–41)
Albumin: 4.4 g/dL (ref 3.5–5.0)
Alkaline Phosphatase: 59 U/L (ref 38–126)
Anion gap: 11 (ref 5–15)
BUN: 13 mg/dL (ref 8–23)
CO2: 30 mmol/L (ref 22–32)
Calcium: 9.7 mg/dL (ref 8.9–10.3)
Chloride: 99 mmol/L (ref 98–111)
Creatinine, Ser: 0.77 mg/dL (ref 0.44–1.00)
GFR calc Af Amer: >60 mL/min (ref >60)
GFR calc non Af Amer: >60 mL/min (ref >60)
Glucose, Bld: 133 mg/dL — ABNORMAL HIGH (ref 70–99)
Potassium: 3.2 mmol/L — ABNORMAL LOW (ref 3.5–5.1)
Sodium: 140 mmol/L (ref 135–145)
Total Bilirubin: 0.9 mg/dL (ref 0.3–1.2)
Total Protein: 7.9 g/dL (ref 6.5–8.1)

## 2019-11-07 LAB — CBC WITH DIFFERENTIAL/PLATELET
Abs Immature Granulocytes: 0.01 10*3/uL (ref 0.00–0.07)
Basophils Absolute: 0 10*3/uL (ref 0.0–0.1)
Basophils Relative: 0 %
Eosinophils Absolute: 0.1 10*3/uL (ref 0.0–0.5)
Eosinophils Relative: 3 %
HCT: 37.5 % (ref 36.0–46.0)
Hemoglobin: 12.7 g/dL (ref 12.0–15.0)
Immature Granulocytes: 0 %
Lymphocytes Relative: 23 %
Lymphs Abs: 0.6 10*3/uL — ABNORMAL LOW (ref 0.7–4.0)
MCH: 30.5 pg (ref 26.0–34.0)
MCHC: 33.9 g/dL (ref 30.0–36.0)
MCV: 90.1 fL (ref 80.0–100.0)
Monocytes Absolute: 0.3 10*3/uL (ref 0.1–1.0)
Monocytes Relative: 10 %
Neutro Abs: 1.7 10*3/uL (ref 1.7–7.7)
Neutrophils Relative %: 64 %
Platelets: 283 10*3/uL (ref 150–400)
RBC: 4.16 MIL/uL (ref 3.87–5.11)
RDW: 13.5 % (ref 11.5–15.5)
WBC: 2.7 10*3/uL — ABNORMAL LOW (ref 4.0–10.5)
nRBC: 0 % (ref 0.0–0.2)

## 2019-11-07 LAB — LACTATE DEHYDROGENASE: LDH: 147 U/L (ref 98–192)

## 2019-11-08 LAB — KAPPA/LAMBDA LIGHT CHAINS
Kappa free light chain: 29.1 mg/L — ABNORMAL HIGH (ref 3.3–19.4)
Kappa, lambda light chain ratio: 1.58 (ref 0.26–1.65)
Lambda free light chains: 18.4 mg/L (ref 5.7–26.3)

## 2019-11-09 LAB — PROTEIN ELECTROPHORESIS, SERUM
A/G Ratio: 1.1 (ref 0.7–1.7)
Albumin ELP: 3.8 g/dL (ref 2.9–4.4)
Alpha-1-Globulin: 0.2 g/dL (ref 0.0–0.4)
Alpha-2-Globulin: 0.7 g/dL (ref 0.4–1.0)
Beta Globulin: 1.2 g/dL (ref 0.7–1.3)
Gamma Globulin: 1.5 g/dL (ref 0.4–1.8)
Globulin, Total: 3.6 g/dL (ref 2.2–3.9)
Total Protein ELP: 7.4 g/dL (ref 6.0–8.5)

## 2019-11-13 ENCOUNTER — Emergency Department (HOSPITAL_COMMUNITY): Payer: PRIVATE HEALTH INSURANCE

## 2019-11-13 ENCOUNTER — Other Ambulatory Visit: Payer: Self-pay

## 2019-11-13 ENCOUNTER — Emergency Department (HOSPITAL_COMMUNITY)
Admission: EM | Admit: 2019-11-13 | Discharge: 2019-11-13 | Disposition: A | Discharge status: Home / Self Care | Payer: PRIVATE HEALTH INSURANCE | Attending: Emergency Medicine | Admitting: Emergency Medicine

## 2019-11-13 ENCOUNTER — Encounter (HOSPITAL_COMMUNITY): Payer: Self-pay

## 2019-11-13 DIAGNOSIS — I110 Hypertensive heart disease with heart failure: Secondary | ICD-10-CM | POA: Insufficient documentation

## 2019-11-13 DIAGNOSIS — I5032 Chronic diastolic (congestive) heart failure: Secondary | ICD-10-CM | POA: Diagnosis not present

## 2019-11-13 DIAGNOSIS — E876 Hypokalemia: Secondary | ICD-10-CM | POA: Insufficient documentation

## 2019-11-13 DIAGNOSIS — J449 Chronic obstructive pulmonary disease, unspecified: Secondary | ICD-10-CM | POA: Diagnosis not present

## 2019-11-13 DIAGNOSIS — Z7982 Long term (current) use of aspirin: Secondary | ICD-10-CM | POA: Diagnosis not present

## 2019-11-13 DIAGNOSIS — U071 COVID-19: Secondary | ICD-10-CM | POA: Diagnosis not present

## 2019-11-13 DIAGNOSIS — Z85830 Personal history of malignant neoplasm of bone: Secondary | ICD-10-CM | POA: Insufficient documentation

## 2019-11-13 DIAGNOSIS — Z79899 Other long term (current) drug therapy: Secondary | ICD-10-CM | POA: Diagnosis not present

## 2019-11-13 DIAGNOSIS — Z8673 Personal history of transient ischemic attack (TIA), and cerebral infarction without residual deficits: Secondary | ICD-10-CM | POA: Insufficient documentation

## 2019-11-13 DIAGNOSIS — I10 Essential (primary) hypertension: Secondary | ICD-10-CM | POA: Insufficient documentation

## 2019-11-13 DIAGNOSIS — Z794 Long term (current) use of insulin: Secondary | ICD-10-CM | POA: Insufficient documentation

## 2019-11-13 DIAGNOSIS — R509 Fever, unspecified: Secondary | ICD-10-CM | POA: Diagnosis present

## 2019-11-13 HISTORY — DX: Malignant (primary) neoplasm, unspecified: C80.1

## 2019-11-13 LAB — FIBRINOGEN: Fibrinogen: 483 mg/dL — ABNORMAL HIGH (ref 210–475)

## 2019-11-13 LAB — COMPREHENSIVE METABOLIC PANEL
ALT: 36 U/L (ref 0–44)
AST: 28 U/L (ref 15–41)
Albumin: 4 g/dL (ref 3.5–5.0)
Alkaline Phosphatase: 47 U/L (ref 38–126)
Anion gap: 10 (ref 5–15)
BUN: 9 mg/dL (ref 8–23)
CO2: 29 mmol/L (ref 22–32)
Calcium: 8.7 mg/dL — ABNORMAL LOW (ref 8.9–10.3)
Chloride: 96 mmol/L — ABNORMAL LOW (ref 98–111)
Creatinine, Ser: 0.8 mg/dL (ref 0.44–1.00)
GFR calc Af Amer: >60 mL/min (ref >60)
GFR calc non Af Amer: >60 mL/min (ref >60)
Glucose, Bld: 102 mg/dL — ABNORMAL HIGH (ref 70–99)
Potassium: 2.3 mmol/L — CL (ref 3.5–5.1)
Sodium: 135 mmol/L (ref 135–145)
Total Bilirubin: 0.9 mg/dL (ref 0.3–1.2)
Total Protein: 7.6 g/dL (ref 6.5–8.1)

## 2019-11-13 LAB — FERRITIN: Ferritin: 346 ng/mL — ABNORMAL HIGH (ref 11–307)

## 2019-11-13 LAB — PROCALCITONIN: Procalcitonin: <0.1 ng/mL

## 2019-11-13 LAB — CBC WITH DIFFERENTIAL/PLATELET
Abs Immature Granulocytes: 0.01 10*3/uL (ref 0.00–0.07)
Basophils Absolute: 0 10*3/uL (ref 0.0–0.1)
Basophils Relative: 0 %
Eosinophils Absolute: 0 10*3/uL (ref 0.0–0.5)
Eosinophils Relative: 0 %
HCT: 33.5 % — ABNORMAL LOW (ref 36.0–46.0)
Hemoglobin: 11.3 g/dL — ABNORMAL LOW (ref 12.0–15.0)
Immature Granulocytes: 0 %
Lymphocytes Relative: 13 %
Lymphs Abs: 0.6 10*3/uL — ABNORMAL LOW (ref 0.7–4.0)
MCH: 30.3 pg (ref 26.0–34.0)
MCHC: 33.7 g/dL (ref 30.0–36.0)
MCV: 89.8 fL (ref 80.0–100.0)
Monocytes Absolute: 0.5 10*3/uL (ref 0.1–1.0)
Monocytes Relative: 10 %
Neutro Abs: 3.4 10*3/uL (ref 1.7–7.7)
Neutrophils Relative %: 77 %
Platelets: 231 10*3/uL (ref 150–400)
RBC: 3.73 MIL/uL — ABNORMAL LOW (ref 3.87–5.11)
RDW: 13.4 % (ref 11.5–15.5)
WBC: 4.5 10*3/uL (ref 4.0–10.5)
nRBC: 0 % (ref 0.0–0.2)

## 2019-11-13 LAB — SARS CORONAVIRUS 2 BY RT PCR (HOSPITAL ORDER, PERFORMED IN ~~LOC~~ HOSPITAL LAB): SARS Coronavirus 2: POSITIVE — AB

## 2019-11-13 LAB — LACTATE DEHYDROGENASE: LDH: 153 U/L (ref 98–192)

## 2019-11-13 LAB — LACTIC ACID, PLASMA: Lactic Acid, Venous: 1.2 mmol/L (ref 0.5–1.9)

## 2019-11-13 LAB — D-DIMER, QUANTITATIVE: D-Dimer, Quant: 1.08 ug/mL-FEU — ABNORMAL HIGH (ref 0.00–0.50)

## 2019-11-13 LAB — C-REACTIVE PROTEIN: CRP: 5.2 mg/dL — ABNORMAL HIGH (ref <1.0)

## 2019-11-13 LAB — TRIGLYCERIDES: Triglycerides: 66 mg/dL (ref <150)

## 2019-11-13 MED ORDER — ACETAMINOPHEN 325 MG PO TABS: 650.0000 mg | ORAL_TABLET | Freq: Once | ORAL | Status: DC

## 2019-11-13 MED ORDER — POTASSIUM CHLORIDE CRYS ER 20 MEQ PO TBCR
40.0000 meq | EXTENDED_RELEASE_TABLET | Freq: Once | ORAL | Status: AC
  Administered 2019-11-13: 40 meq via ORAL
  Filled 2019-11-13: qty 2

## 2019-11-13 MED ORDER — ALBUTEROL SULFATE HFA 108 (90 BASE) MCG/ACT IN AERS: 1.0000 | INHALATION_SPRAY | RESPIRATORY_TRACT | 0 refills | Status: DC | PRN

## 2019-11-13 MED ORDER — ACETAMINOPHEN 325 MG PO TABS
650.0000 mg | ORAL_TABLET | Freq: Once | ORAL | Status: AC | PRN
  Administered 2019-11-13: 650 mg via ORAL
  Filled 2019-11-13: qty 2

## 2019-11-13 MED ORDER — ONDANSETRON 8 MG PO TBDP
8.0000 mg | ORAL_TABLET | Freq: Three times a day (TID) | ORAL | 0 refills | Status: DC | PRN
Start: 2019-11-13 — End: 2019-12-05

## 2019-11-13 NOTE — ED Triage Notes (Signed)
EMS reports pt c/o cough, wheezing, generalized weakness, and low grade fever since 6pm last night.  Pt had J and J vaccine 2 months ago.  BP 153/91, hr 101.

## 2019-11-13 NOTE — ED Provider Notes (Signed)
[facility] Provider Note   CSN: [id number] Arrival date & time: 11/13/19  1228     History Chief Complaint  Patient presents with  . Fever    Hannah Cooper is a 64 y.o. female.  HPI     64 year old female comes in a chief complaint of fever.  Patient has history of hypertension, hyperlipidemia, COPD, spine cancer.  She comes in with chief complaint of weakness, fevers and chills.  She has been feeling this way for the last 24 hours.  She denies any COVID-19 exposures.  She did receive [employer] shot couple of months back.  Past Medical History:  Diagnosis Date  . Arthritis   . Atypical chest pain    chronic  . Cancer ([place])    bone cancer  . Chronic back pain   . Chronic diastolic CHF (congestive heart failure) ([place])   . COPD (chronic obstructive pulmonary disease) ([employer])   . DDD (degenerative disc disease), cervical   . Hyperlipidemia   . Hypertension   . Lumbar radiculopathy   . Meningitis   . Normal coronary arteries 2014  . Palpitations   . Premature atrial contractions   . PVC's (premature ventricular contractions)     Patient Active Problem List   Diagnosis Date Noted  . 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[place]) 07/10/2019  . Plasma cell disorder 06/10/2019  . Lesion of bone of lumbosacral spine 05/28/2019  . Left-sided weakness 03/13/2016  . Cerebrovascular accident (CVA) ([place])   . Palpitations   . COPD (chronic obstructive pulmonary disease) ([place]) 08/20/2014  . Dyspnea 12/19/2013  . Chronic diastolic CHF (congestive heart failure) ([place]) 07/05/2013  . Lower extremity edema 07/05/2013  . Hyperlipidemia   . Chest pain 07/04/2013  . Hypokalemia 09/04/2012  . Precordial pain 09/04/2012  . Viral meningitis 01/22/2011  . Vomiting 01/22/2011  . PNEUMONIA, LEFT LOWER LOBE 10/10/2006  . DISEASE, ACUTE BRONCHOSPASM 10/10/2006  . Essential hypertension 05/23/2006  . OSTEOARTHRITIS 05/23/2006    Past Surgical History:   Procedure Laterality Date  . ABDOMINAL HYSTERECTOMY    . IR FLUORO GUIDED NEEDLE PLC ASPIRATION/INJECTION LOC  06/19/2019  . LEFT HEART CATHETERIZATION WITH CORONARY ANGIOGRAM N/A 09/05/2012   Procedure: LEFT HEART CATHETERIZATION WITH CORONARY ANGIOGRAM;  Surgeon: Wellington Hampshire, MD;  Location: [facility];  Service: Cardiovascular;  Laterality: N/A;  . THYROID SURGERY  2002     OB History    Gravida      Para      Term      Preterm      AB      Living  0     SAB      TAB      Ectopic      Multiple      Live Births              Family History  Problem Relation Age of Onset  . Heart attack Mother 62  . Heart attack Sister 58    Social History   Tobacco Use  . Smoking status: Never Smoker  . Smokeless tobacco: Never Used  Vaping Use  . Vaping Use: Never used  Substance Use Topics  . Alcohol use: No    Alcohol/week: 0.0 standard drinks  . Drug use: No    Home Medications Prior to Admission medications   Medication Sig Start Date End Date Taking? Authorizing Provider  acetaminophen (TYLENOL) 500 MG tablet Take 1,000 mg by mouth every 6 (  six) hours as needed for headache (pain).    Yes [provider]  albuterol (PROVENTIL) (2.5 MG/3ML) 0.083% nebulizer solution Take 2.5 mg by nebulization every 6 (six) hours as needed for wheezing or shortness of breath.   Yes [provider]  Albuterol Sulfate (PROAIR RESPICLICK) 416 (90 Base) MCG/ACT AEPB Inhale 2 puffs into the lungs every 4 (four) hours as needed (shortness of breath/ wheezing).   Yes [provider]  aspirin EC 81 MG tablet Take 81 mg by mouth daily.   Yes [provider]  cycloSPORINE (RESTASIS) 0.05 % ophthalmic emulsion Place 1 drop into both eyes 2 (two) times daily.    Yes [provider]  diltiazem (CARDIZEM) 30 MG tablet Take 1 tablet (30 mg total) by mouth 2 (two) times daily. 06/24/19  Yes BranchAlphonse Guild, MD  gabapentin (NEURONTIN) 300 MG  capsule Take 1 capsule (300 mg total) by mouth at bedtime. 08/20/19  Yes Gery Pray, MD  glipiZIDE-metformin (METAGLIP) 5-500 MG tablet Take 1 tablet by mouth in the morning and at bedtime. 09/27/19  Yes [provider]  HYDROcodone-acetaminophen (NORCO) 10-325 MG tablet Take 1 tablet by mouth 2 (two) times daily as needed for moderate pain (pain.). 09/26/19  Yes Gery Pray, MD  ibuprofen (ADVIL,MOTRIN) 800 MG tablet Take 1 tablet by mouth 2 (two) times daily as needed (back pain).  08/01/14  Yes [provider]  Insulin Glargine (BASAGLAR KWIKPEN) 100 UNIT/ML SMARTSIG:15 Unit(s) SUB-Q Every Night 10/04/19  Yes [provider]  isosorbide mononitrate (IMDUR) 30 MG 24 hr tablet TAKE 1 TABLET (30 MG TOTAL) BY MOUTH DAILY. Patient taking differently: Take 30 mg by mouth daily.  04/19/17  Yes Satira Sark, MD  metoprolol tartrate (LOPRESSOR) 100 MG tablet Take 100 mg by mouth 2 (two) times daily.   Yes [provider]  chlorthalidone (HYGROTON) 25 MG tablet Take 1 tablet (25 mg total) by mouth daily. 07/12/19 10/10/19  Arnoldo Lenis, MD  dexamethasone (DECADRON) 4 MG tablet Take 1 tablet (4 mg total) by mouth daily. Patient not taking: Reported on 11/13/2019 08/13/19   Gery Pray, MD  furosemide (LASIX) 20 MG tablet Take 1 tablet daily as needed may take 2 tablets prn Patient taking differently: Take 20 mg by mouth 2 (two) times daily as needed (fluid retention.).  11/10/16   Arnoldo Lenis, MD  mupirocin ointment (BACTROBAN) 2 % Apply 1 application topically 3 (three) times daily as needed. 09/20/19   [provider]  ondansetron (ZOFRAN ODT) 8 MG disintegrating tablet Take 1 tablet (8 mg total) by mouth every 8 (eight) hours as needed for nausea. 11/13/19   Varney Biles, MD  prochlorperazine (COMPAZINE) 10 MG tablet Take 1 tablet (10 mg total) by mouth every 6 (six) hours as needed for nausea or vomiting. 07/18/19   Gery Pray, MD    Allergies     Other  Review of Systems   Review of Systems  Constitutional: Positive for activity change, chills and fever.  Respiratory: Positive for cough and shortness of breath.   Gastrointestinal: Positive for nausea. Negative for vomiting.  Allergic/Immunologic: Positive for immunocompromised state.  All other systems reviewed and are negative.   Physical Exam Updated Vital Signs BP (!) 155/93   Pulse 89   Temp 100.3 F (37.9 C) (Oral)   Resp 15   Ht 5\' 6"  (1.676 m)   Wt (!) 102.1 kg   SpO2 97%   BMI 36.32 kg/m   Physical  Exam Vitals and nursing note reviewed.  Constitutional:      Appearance: She is well-developed.  HENT:     Head: Normocephalic and atraumatic.  Cardiovascular:     Rate and Rhythm: Normal rate.  Pulmonary:     Effort: Pulmonary effort is normal.  Abdominal:     General: Bowel sounds are normal.  Musculoskeletal:     Cervical back: Normal range of motion and neck supple.  Skin:    General: Skin is warm and dry.  Neurological:     Mental Status: She is alert and oriented to person, place, and time.     ED Results / Procedures / Treatments   Labs (all labs ordered are listed, but only abnormal results are displayed) Labs Reviewed  SARS CORONAVIRUS 2 BY RT PCR ([facility]) - Abnormal; Notable for the following components:      Result Value   SARS Coronavirus 2 POSITIVE (*)    All other components within normal limits  CBC WITH DIFFERENTIAL/PLATELET - Abnormal; Notable for the following components:   RBC 3.73 (*)    Hemoglobin 11.3 (*)    HCT 33.5 (*)    Lymphs Abs 0.6 (*)    All other components within normal limits  COMPREHENSIVE METABOLIC PANEL - Abnormal; Notable for the following components:   Potassium 2.3 (*)    Chloride 96 (*)    Glucose, Bld 102 (*)    Calcium 8.7 (*)    All other components within normal limits  D-DIMER, QUANTITATIVE (NOT AT [facility]) - Abnormal; Notable for the following  components:   D-Dimer, Quant 1.08 (*)    All other components within normal limits  FERRITIN - Abnormal; Notable for the following components:   Ferritin 346 (*)    All other components within normal limits  FIBRINOGEN - Abnormal; Notable for the following components:   Fibrinogen 483 (*)    All other components within normal limits  C-REACTIVE PROTEIN - Abnormal; Notable for the following components:   CRP 5.2 (*)    All other components within normal limits  CULTURE, BLOOD (ROUTINE X 2)  CULTURE, BLOOD (ROUTINE X 2) W REFLEX TO ID PANEL  LACTIC ACID, PLASMA  PROCALCITONIN  LACTATE DEHYDROGENASE  TRIGLYCERIDES    EKG None  Radiology DG Chest Portable 1 View  Result Date: 11/13/2019 CLINICAL DATA:  Fever and cough.  Wheezing.  Weakness. EXAM: PORTABLE CHEST 1 VIEW COMPARISON:  10/07/2017 FINDINGS: Artifact overlies the chest. Heart size is normal. The aorta is tortuous. The lungs are clear. No infiltrate, collapse or effusion. No regional bone finding. IMPRESSION: No active disease. Electronically Signed   By: Nelson Chimes M.D.   On: 11/13/2019 13:40    Procedures Procedures (including critical care time)  Medications Ordered in ED Medications  acetaminophen (TYLENOL) tablet 650 mg (650 mg Oral Not Given 11/13/19 1426)  potassium chloride SA (KLOR-CON) CR tablet 40 mEq (has no administration in time range)  acetaminophen (TYLENOL) tablet 650 mg (650 mg Oral Given 11/13/19 1302)  potassium chloride SA (KLOR-CON) CR tablet 40 mEq (40 mEq Oral Given 11/13/19 1526)    ED Course  I have reviewed the triage vital signs and the nursing notes.  Pertinent labs & imaging results that were available during my care of the patient were reviewed by me and considered in my medical decision making (see chart for details).    MDM Rules/Calculators/A&P  Hannah Cooper was evaluated in Emergency Department on 11/13/2019 for the symptoms described in the history of  present illness. She was evaluated in the context of the global COVID-19 pandemic, which necessitated consideration that the patient might be at risk for infection with the SARS-CoV-2 virus that causes COVID-19. Institutional protocols and algorithms that pertain to the evaluation of patients at risk for COVID-19 are in a state of rapid change based on information released by regulatory bodies including the CDC and federal and state organizations. These policies and algorithms were followed during the patient's care in the ED.  64 year old woman comes in a chief complaint of malaise, fevers.  She is noted to have fever here along with tachycardia.  Lung exam is clear.  Clinically, it appears that she most likely has a viral infection.  Given that she had vaccine, likelihood of COVID-19 is lower, but not entirely impossible.  Appropriate labs have been initiated.  4:05 PM Patient's COVID-19 test results did come back positive.  Results from the ED work-up discussed with her.  She had no point appear toxic.  At this time she is stable for discharge.  The patient appears reasonably screened and/or stabilized for discharge and I doubt any other medical condition or other [facility] requiring further screening, evaluation, or treatment in the ED at this time prior to discharge.   Results from the ER workup discussed with the patient face to face and all questions answered to the best of my ability. The patient is safe for discharge with strict return precautions.    Final Clinical Impression(s) / ED Diagnoses Final diagnoses:  COVID-19  Hypokalemia    Rx / DC Orders ED Discharge Orders         Ordered    ondansetron (ZOFRAN ODT) 8 MG disintegrating tablet  Every 8 hours PRN     Discontinue  Reprint     11/13/19 [place], Cherilyn Sautter, MD 11/13/19 1605

## 2019-11-13 NOTE — Discharge Instructions (Addendum)
We saw in the ER for weakness and fevers.  Your COVID-19 test is positive.  Please read the instructions provided.  Take the nausea medication only if you are having severe nausea or vomiting.  Otherwise take daily aspirin 81 mg, Tylenol for fever, hydrate well.

## 2019-11-13 NOTE — ED Notes (Signed)
Date and time results received: 11/13/19 1419 (use smartphrase ".now" to insert current time)  Test: Covid Critical Value: Positive  Name of Provider Notified:Dr Kathrynn Humble Orders Received? Or Actions Taken?: NA

## 2019-11-14 ENCOUNTER — Telehealth: Payer: Self-pay | Admitting: Adult Health

## 2019-11-14 ENCOUNTER — Ambulatory Visit (HOSPITAL_COMMUNITY): Payer: No Typology Code available for payment source

## 2019-11-14 ENCOUNTER — Other Ambulatory Visit (HOSPITAL_COMMUNITY): Payer: No Typology Code available for payment source

## 2019-11-14 NOTE — Telephone Encounter (Signed)
I called Hannah Cooper today to discuss her positive covid diagnosis and the use of monoclonal antibody treatments to reduce the risk of hospitalization and/or complications from the virus.  She is at increased risk for these due to her age, BMI, HTN, lung disease, heart disease.    She wants to read about the treatment and will call back once she makes a decision.  Wilber Bihari, NP

## 2019-11-15 ENCOUNTER — Ambulatory Visit (HOSPITAL_COMMUNITY): Payer: No Typology Code available for payment source | Admitting: Hematology

## 2019-11-18 LAB — CULTURE, BLOOD (ROUTINE X 2)
Culture: NO GROWTH
Culture: NO GROWTH
Special Requests: ADEQUATE
Special Requests: ADEQUATE

## 2019-11-21 ENCOUNTER — Ambulatory Visit (HOSPITAL_COMMUNITY): Payer: No Typology Code available for payment source | Admitting: Hematology

## 2019-12-03 ENCOUNTER — Other Ambulatory Visit: Payer: Self-pay

## 2019-12-03 ENCOUNTER — Ambulatory Visit (HOSPITAL_COMMUNITY)
Admission: RE | Admit: 2019-12-03 | Discharge: 2019-12-03 | Disposition: A | Discharge status: Home / Self Care | Payer: PRIVATE HEALTH INSURANCE | Source: Ambulatory Visit | Attending: Hematology | Admitting: Hematology

## 2019-12-03 DIAGNOSIS — M899 Disorder of bone, unspecified: Secondary | ICD-10-CM | POA: Diagnosis present

## 2019-12-03 MED ORDER — GADOBUTROL 1 MMOL/ML IV SOLN
10.0000 mL | Freq: Once | INTRAVENOUS | Status: AC | PRN
  Administered 2019-12-03: 10 mL via INTRAVENOUS

## 2019-12-05 ENCOUNTER — Inpatient Hospital Stay (HOSPITAL_COMMUNITY): Payer: Medicare Other | Attending: Hematology | Admitting: Hematology

## 2019-12-05 VITALS — BP 141/94 | HR 92 | Temp 99.7°F | Resp 16 | Wt 220.2 lb

## 2019-12-05 DIAGNOSIS — Z8249 Family history of ischemic heart disease and other diseases of the circulatory system: Secondary | ICD-10-CM | POA: Diagnosis not present

## 2019-12-05 DIAGNOSIS — Z90710 Acquired absence of both cervix and uterus: Secondary | ICD-10-CM | POA: Insufficient documentation

## 2019-12-05 DIAGNOSIS — M545 Low back pain: Secondary | ICD-10-CM | POA: Insufficient documentation

## 2019-12-05 DIAGNOSIS — Z79899 Other long term (current) drug therapy: Secondary | ICD-10-CM | POA: Insufficient documentation

## 2019-12-05 DIAGNOSIS — I5032 Chronic diastolic (congestive) heart failure: Secondary | ICD-10-CM | POA: Diagnosis not present

## 2019-12-05 DIAGNOSIS — Z794 Long term (current) use of insulin: Secondary | ICD-10-CM | POA: Insufficient documentation

## 2019-12-05 DIAGNOSIS — I110 Hypertensive heart disease with heart failure: Secondary | ICD-10-CM | POA: Insufficient documentation

## 2019-12-05 DIAGNOSIS — E785 Hyperlipidemia, unspecified: Secondary | ICD-10-CM | POA: Diagnosis not present

## 2019-12-05 DIAGNOSIS — J449 Chronic obstructive pulmonary disease, unspecified: Secondary | ICD-10-CM | POA: Insufficient documentation

## 2019-12-05 DIAGNOSIS — C9030 Solitary plasmacytoma not having achieved remission: Secondary | ICD-10-CM | POA: Diagnosis present

## 2019-12-05 MED ORDER — HYDROCODONE-ACETAMINOPHEN 10-325 MG PO TABS: 1.0000 | ORAL_TABLET | Freq: Two times a day (BID) | ORAL | 0 refills | Status: DC | PRN

## 2019-12-05 NOTE — Patient Instructions (Signed)
[place] at [facility] Discharge Instructions  You were seen today by Dr. Delton Coombes. He went over your recent results and scans. Continue staying physically active as much as tolerable. Dr. Delton Coombes will see you back in 3 months for labs and follow up.   Thank you for choosing [place] at [facility] to provide your oncology and hematology care.  To afford each patient quality time with our provider, please arrive at least 15 minutes before your scheduled appointment time.   If you have a lab appointment with the [facility] please come in thru the Main Entrance and check in at the main information desk  You need to re-schedule your appointment should you arrive 10 or more minutes late.  We strive to give you quality time with our providers, and arriving late affects you and other patients whose appointments are after yours.  Also, if you no show three or more times for appointments you may be dismissed from the clinic at the providers discretion.     Again, thank you for choosing [facility].  Our hope is that these requests will decrease the amount of time that you wait before being seen by our physicians.       _____________________________________________________________  Should you have questions after your visit to [facility], please contact our office at [phone] between the hours of 8:00 a.m. and 4:30 p.m.  Voicemails left after 4:00 p.m. will not be returned until the following business day.  For prescription refill requests, have your pharmacy contact our office and allow 72 hours.    Cancer Center Support Programs:   > Cancer Support Group  2nd Tuesday of the month 1pm-2pm, Journey Room

## 2019-12-05 NOTE — Progress Notes (Signed)
[name], [place] [address]   CLINIC:  Medical Oncology/Hematology  PCP:  Hannah Cooper [address] / [place] [address] [phone]   REASON FOR VISIT:  Follow-up for L4 plasmacytoma  PRIOR THERAPY: XRT in 27 fractions from 07/17/2019 to 08/22/2019.  NGS Results: Not done  CURRENT THERAPY: Observation  BRIEF ONCOLOGIC HISTORY:  Oncology History   No history exists.    CANCER STAGING: Cancer Staging No matching staging information was found for the patient.  INTERVAL HISTORY:  Hannah Cooper, a 64 y.o. female, returns for routine follow-up of her L4 plasmacytoma. Hannah Cooper was last seen on 07/22/2019.  Today she complains of back pain with shooting pains going down into her left lower leg which hurts when she moves around to do things; she gets pain even when she is driving. She reports that she had bodily aches and fevers and she was diagnosed with COVID on 11/13/2019 when she came to [place]; she was vaccinated on 6/6.   REVIEW OF SYSTEMS:  Review of Systems  Constitutional: Positive for appetite change (moderately decreased) and fatigue (moderate).  Cardiovascular: Positive for palpitations.  Musculoskeletal: Positive for back pain (8/10 lower back pain radiating down L leg).  All other systems reviewed and are negative.   PAST MEDICAL/SURGICAL HISTORY:  Past Medical History:  Diagnosis Date  . Arthritis   . Atypical chest pain    chronic  . Cancer ([place])    bone cancer  . Chronic back pain   . Chronic diastolic CHF (congestive heart failure) ([place])   . COPD (chronic obstructive pulmonary disease) ([place])   . DDD (degenerative disc disease), cervical   . Hyperlipidemia   . Hypertension   . Lumbar radiculopathy   . Meningitis   . Normal coronary arteries 2014  . Palpitations   . Premature atrial contractions   . PVC's (premature ventricular contractions)    Past Surgical History:    Procedure Laterality Date  . ABDOMINAL HYSTERECTOMY    . IR FLUORO GUIDED NEEDLE PLC ASPIRATION/INJECTION LOC  06/19/2019  . LEFT HEART CATHETERIZATION WITH CORONARY ANGIOGRAM N/A 09/05/2012   Procedure: LEFT HEART CATHETERIZATION WITH CORONARY ANGIOGRAM;  Surgeon: Wellington Hampshire, MD;  Location: [facility];  Service: Cardiovascular;  Laterality: N/A;  . THYROID SURGERY  2002    SOCIAL HISTORY:  Social History   Socioeconomic History  . Marital status: Married    Spouse name: Not on file  . Number of children: Not on file  . Years of education: Not on file  . Highest education level: Not on file  Occupational History  . Not on file  Tobacco Use  . Smoking status: Never Smoker  . Smokeless tobacco: Never Used  Vaping Use  . Vaping Use: Never used  Substance and Sexual Activity  . Alcohol use: No    Alcohol/week: 0.0 standard drinks  . Drug use: No  . Sexual activity: Yes    Partners: Male  Other Topics Concern  . Not on file  Social History Narrative  . Not on file   Social Determinants of Health   Financial Resource Strain: Low Risk   . Difficulty of Paying Living Expenses: Not hard at all  Food Insecurity: No Food Insecurity  . Worried About Charity fundraiser in the Last Year: Never true  . Ran Out of Food in the Last Year: Never true  Transportation Needs: No Transportation Needs  . Lack of  Transportation (Medical): No  . Lack of Transportation (Non-Medical): No  Physical Activity: Inactive  . Days of Exercise per Week: 0 days  . Minutes of Exercise per Session: 0 min  Stress: Stress Concern Present  . Feeling of Stress : To some extent  Social Connections: Socially Integrated  . Frequency of Communication with Friends and Family: More than three times a week  . Frequency of Social Gatherings with Friends and Family: Never  . Attends Religious Services: More than 4 times per year  . Active Member of Clubs or Organizations: Yes  . Attends Club or  Organization Meetings: More than 4 times per year  . Marital Status: Married  Intimate Partner Violence: Not At Risk  . Fear of Current or Ex-Partner: No  . Emotionally Abused: No  . Physically Abused: No  . Sexually Abused: No    FAMILY HISTORY:  Family History  Problem Relation Age of Onset  . Heart attack Mother 52  . Heart attack Sister 49    CURRENT MEDICATIONS:  Current Outpatient Medications  Medication Sig Dispense Refill  . Albuterol Sulfate (PROAIR RESPICLICK) 108 (90 Base) MCG/ACT AEPB Inhale 2 puffs into the lungs every 4 (four) hours as needed (shortness of breath/ wheezing).    . cycloSPORINE (RESTASIS) 0.05 % ophthalmic emulsion Place 1 drop into both eyes 2 (two) times daily.     . diltiazem (CARDIZEM) 30 MG tablet Take 1 tablet (30 mg total) by mouth 2 (two) times daily. 60 tablet 11  . gabapentin (NEURONTIN) 300 MG capsule Take 1 capsule (300 mg total) by mouth at bedtime. 30 capsule 1  . glipiZIDE-metformin (METAGLIP) 5-500 MG tablet Take 1 tablet by mouth in the morning and at bedtime.    . HYDROcodone-acetaminophen (NORCO) 10-325 MG tablet Take 1 tablet by mouth 2 (two) times daily as needed for moderate pain (pain.). 30 tablet 0  . Insulin Glargine (BASAGLAR KWIKPEN) 100 UNIT/ML SMARTSIG:15 Unit(s) SUB-Q Every Night    . isosorbide mononitrate (IMDUR) 30 MG 24 hr tablet TAKE 1 TABLET (30 MG TOTAL) BY MOUTH DAILY. (Patient taking differently: Take 30 mg by mouth daily. ) 90 tablet 0  . metoprolol tartrate (LOPRESSOR) 100 MG tablet Take 100 mg by mouth 2 (two) times daily.    . mupirocin ointment (BACTROBAN) 2 % Apply 1 application topically 3 (three) times daily as needed.    . acetaminophen (TYLENOL) 500 MG tablet Take 1,000 mg by mouth every 6 (six) hours as needed for headache (pain).  (Patient not taking: Reported on 12/05/2019)    . albuterol (PROVENTIL) (2.5 MG/3ML) 0.083% nebulizer solution Take 2.5 mg by nebulization every 6 (six) hours as needed for wheezing  or shortness of breath. (Patient not taking: Reported on 12/05/2019)    . albuterol (VENTOLIN HFA) 108 (90 Base) MCG/ACT inhaler Inhale 1-2 puffs into the lungs every 4 (four) hours as needed for wheezing or shortness of breath. (Patient not taking: Reported on 12/05/2019) 18 g 0  . chlorthalidone (HYGROTON) 25 MG tablet Take 1 tablet (25 mg total) by mouth daily. 90 tablet 3  . furosemide (LASIX) 20 MG tablet Take 1 tablet daily as needed may take 2 tablets prn (Patient not taking: Reported on 12/05/2019) 60 tablet 1  . ibuprofen (ADVIL,MOTRIN) 800 MG tablet Take 1 tablet by mouth 2 (two) times daily as needed (back pain).  (Patient not taking: Reported on 12/05/2019)  2   No current facility-administered medications for this visit.    ALLERGIES:  Allergies    Allergen Reactions  . Other Swelling    [place] lipstick    PHYSICAL EXAM:  Performance status (ECOG): 1 - Symptomatic but completely ambulatory  Vitals:   12/05/19 1628  BP: (!) 141/94  Pulse: 92  Resp: 16  Temp: 99.7 F (37.6 C)  SpO2: 100%   Wt Readings from Last 3 Encounters:  12/05/19 220 lb 3.2 oz (99.9 kg)  11/13/19 (!) 225 lb (102.1 kg)  09/29/19 230 lb (104.3 kg)   Physical Exam Vitals reviewed.  Constitutional:      Appearance: Normal appearance. She is obese.  Neurological:     General: No focal deficit present.     Mental Status: She is alert and oriented to person, place, and time.  Psychiatric:        Mood and Affect: Mood normal.        Behavior: Behavior normal.      LABORATORY DATA:  I have reviewed the labs as listed.  CBC Latest Ref Rng & Units 11/13/2019 11/07/2019 09/29/2019  WBC 4.0 - 10.5 K/uL 4.5 2.7(L) 3.6(L)  Hemoglobin 12.0 - 15.0 g/dL 11.3(L) 12.7 13.2  Hematocrit 36 - 46 % 33.5(L) 37.5 38.5  Platelets 150 - 400 K/uL 231 283 250   CMP Latest Ref Rng & Units 11/13/2019 11/07/2019 09/29/2019  Glucose 70 - 99 mg/dL 102(H) 133(H) 366(H)  BUN 8 - 23 mg/dL _0 Creatinine 0.44 - 1.00 mg/dL  0.80 0.77 0.86  Sodium 135 - 145 mmol/L 135 140 135  Potassium 3.5 - 5.1 mmol/L 2.3(LL) 3.2(L) 3.3(L)  Chloride 98 - 111 mmol/L 96(L) 99 99  CO2 22 - 32 mmol/L _1 Calcium 8.9 - 10.3 mg/dL 8.7(L) 9.7 8.9  Total Protein 6.5 - 8.1 g/dL 7.6 7.9 -  Total Bilirubin 0.3 - 1.2 mg/dL 0.9 0.9 -  Alkaline Phos 38 - 126 U/L 47 59 -  AST 15 - 41 U/L 28 26 -  ALT 0 - 44 U/L 36 38 -   Lab Results  Component Value Date   LDH 153 11/13/2019   LDH 147 11/07/2019   LDH 193 (H) 05/28/2019   Lab Results  Component Value Date   FERRITIN 346 (H) 11/13/2019   Lab Results  Component Value Date   TOTALPROTELP 7.4 11/07/2019   ALBUMINELP 3.8 11/07/2019   A1GS 0.2 11/07/2019   A2GS 0.7 11/07/2019   BETS 1.2 11/07/2019   GAMS 1.5 11/07/2019   MSPIKE Not Observed 11/07/2019   SPEI Comment 11/07/2019    Lab Results  Component Value Date   KPAFRELGTCHN 29.1 (H) 11/07/2019   LAMBDASER 18.4 11/07/2019   KAPLAMBRATIO 1.58 11/07/2019    DIAGNOSTIC IMAGING:  I have independently reviewed the scans and discussed with the patient. MR Lumbar Spine W Wo Contrast  Result Date: 12/04/2019 CLINICAL DATA:  L4 plasmacytoma EXAM: MRI LUMBAR SPINE WITHOUT AND WITH CONTRAST TECHNIQUE: Multiplanar and multiecho pulse sequences of the lumbar spine were obtained without and with intravenous contrast. CONTRAST:  31m GADAVIST GADOBUTROL 1 MMOL/ML IV SOLN COMPARISON:  06/07/2019 and earlier FINDINGS: Segmentation:  Standard. Alignment:  Physiologic. Vertebrae: Lesion within the superior half of the L4 vertebral body is unchanged in size. Contrast enhancement is slightly decreased. There are no new lesions. No vertebral body height loss or fracture. Conus medullaris and cauda equina: Conus extends to the L1 level. Conus and cauda equina appear normal. Paraspinal and other soft tissues: Negative Disc levels: L3-4: Moderate facet arthrosis, unchanged. Small disc bulge. No spinal  canal or neural foraminal stenosis. L4-5:  Moderate facet arthrosis, unchanged. Small disc bulge. No spinal canal or neural foraminal stenosis. L5-S1: Moderate facet arthrosis with small disc bulge, unchanged. No spinal canal or neural foraminal stenosis. IMPRESSION: 1. Unchanged size of L4 vertebral body lesion, with slightly decreased contrast enhancement. No new lesions. 2. Unchanged mild lower lumbar degenerative disc disease and moderate facet arthrosis. No spinal canal stenosis or neural impingement. Electronically Signed   By: Kevin  Herman M.D.   On: 12/04/2019 00:29   DG Chest Portable 1 View  Result Date: 11/13/2019 CLINICAL DATA:  Fever and cough.  Wheezing.  Weakness. EXAM: PORTABLE CHEST 1 VIEW COMPARISON:  10/07/2017 FINDINGS: Artifact overlies the chest. Heart size is normal. The aorta is tortuous. The lungs are clear. No infiltrate, collapse or effusion. No regional bone finding. IMPRESSION: No active disease. Electronically Signed   By: Mark  Shogry M.D.   On: 11/13/2019 13:40     ASSESSMENT:  1.  Plasmacytoma of L4 vertebral body: -MRI lumbar spine shows L4 lesion. -PET scan on 06/10/2019 shows mildly increased FDG uptake associated with mixed lytic/sclerotic lesion involving L4 vertebral body SUV 4.4. -Serum immunofixation shows IgG kappa monoclonal protein.  SPEP-no M spike.  Kappa light chains elevated at 25.3.  Ratio 1.12.  Lambda light chains 21.9.  Beta-2 microglobulin 2.2. -24-hour urine was negative for UPEP and immunofixation.  Protein was 130 mg. -L4 needle biopsy on 06/19/2019 showed minute segments of the bone and soft tissue, limited cellularity.  IHC for CD138 highlights scattering plasma cells.  Cytokeratin is negative.  Few kappa positive plasma cells present.  Overall material is very limited and essentially nondiagnostic. -Clinically this is consistent with plasmacytoma.  We reviewed bone marrow biopsy results which showed normocellular marrow.  Cytogenetics are normal. -XRT to L4 vertebral body from 07/16/2019  through 08/22/2019. -MRI of the lumbar spine on 12/03/2019 showed unchanged size of the L4 vertebral body lesion, slightly decreased contrast-enhancement with no new lesions.  2.  Low back pain: -She is taking half tablet of hydrocodone 10/325 every 6 hours as needed which is helping. -Likely this will improve upon completion of radiation.   PLAN:  1.  Plasmacytoma of L4 vertebral body: -Myeloma labs from 11/07/2019 shows SPEP has no M spike.  Kappa light chain is elevated at 29.1 but ratio is normal.  Calcium and creatinine was normal. -We reviewed results of the MRI which did not show any major changes. -I plan to see her back in 3 months with repeat labs.  Plan to repeat scan in 6 months.  2.  Low back pain: -She continues to have low back pain which is radiating down the left leg. -I have given a prescription for hydrocodone 10/325 #60. -I will make a referral to back specialist.   Orders placed this encounter:  No orders of the defined types were placed in this encounter.    Sreedhar Katragadda, MD [facility] [phone]   I, Daniel Khashchuk, am acting as a scribe for Dr. Sreedhar Katagadda.  I, Sreedhar Katragadda MD, have reviewed the above documentation for accuracy and completeness, and I agree with the above.     

## 2019-12-06 ENCOUNTER — Encounter: Payer: Self-pay | Admitting: Physical Medicine and Rehabilitation

## 2019-12-30 ENCOUNTER — Other Ambulatory Visit: Payer: Self-pay

## 2019-12-30 ENCOUNTER — Encounter: Payer: Self-pay | Admitting: Radiation Oncology

## 2019-12-30 ENCOUNTER — Ambulatory Visit
Admission: RE | Admit: 2019-12-30 | Discharge: 2019-12-30 | Disposition: A | Discharge status: Home / Self Care | Payer: PRIVATE HEALTH INSURANCE | Source: Ambulatory Visit | Attending: Radiation Oncology | Admitting: Radiation Oncology

## 2019-12-30 DIAGNOSIS — Z8616 Personal history of COVID-19: Secondary | ICD-10-CM | POA: Diagnosis not present

## 2019-12-30 DIAGNOSIS — M47816 Spondylosis without myelopathy or radiculopathy, lumbar region: Secondary | ICD-10-CM | POA: Diagnosis not present

## 2019-12-30 DIAGNOSIS — Z923 Personal history of irradiation: Secondary | ICD-10-CM | POA: Insufficient documentation

## 2019-12-30 DIAGNOSIS — M79605 Pain in left leg: Secondary | ICD-10-CM | POA: Diagnosis not present

## 2019-12-30 DIAGNOSIS — C9020 Extramedullary plasmacytoma not having achieved remission: Secondary | ICD-10-CM | POA: Insufficient documentation

## 2019-12-30 DIAGNOSIS — R531 Weakness: Secondary | ICD-10-CM | POA: Diagnosis not present

## 2019-12-30 DIAGNOSIS — M5136 Other intervertebral disc degeneration, lumbar region: Secondary | ICD-10-CM | POA: Diagnosis not present

## 2019-12-30 DIAGNOSIS — Z79899 Other long term (current) drug therapy: Secondary | ICD-10-CM | POA: Diagnosis not present

## 2019-12-30 NOTE — Progress Notes (Signed)
Patient here for a f/u visit with Dr. Sondra Come. Pt. reports ongoing pain in her lower back that is a 7-8.  BP (!) 158/84 (BP Location: Right Arm, Patient Position: Sitting, Cuff Size: Large)   Pulse 71   Temp 97.7 F (36.5 C)   Resp 20   Ht 5\' 6"  (1.676 m)   Wt 229 lb (103.9 kg)   SpO2 100%   BMI 36.96 kg/m   Wt Readings from Last 3 Encounters:  12/30/19 229 lb (103.9 kg)  12/05/19 220 lb 3.2 oz (99.9 kg)  11/13/19 (!) 225 lb (102.1 kg)

## 2019-12-30 NOTE — Progress Notes (Signed)
Radiation Oncology         [phone] ________________________________  Name: Hannah Cooper MRN: [id number]  Date: 12/30/2019  DOB: [birth year]  Follow-Up Visit Note  CC: Jake Samples, PA-C  Jake Samples, [place]*    ICD-10-CM   1. 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  Radiation Treatment Dates: 07/16/2019 through 08/22/2019 Site Technique Total Dose (Gy) Dose per Fx (Gy) Completed Fx Beam Energies  Lumbar Spine: Spine 3D 50.4/50.4 1.8 28/28 15X    Narrative:  The patient returns today for routine follow-up. Since her last visit, she was seen in the ED on two different occasions for hyperglycemia (09/27/2019 and 09/29/2019). She was stabilized and discharged home both times. She was also seen in the ED on 11/13/2019 for fever. Although the chest x-ray was negative and despite being vaccinated, she was positive for COVID-19. She was found to be stable and was discharged home. The following day, she was placed on monoclonal antibody treatments to reduce the risk of hospitalization and/or complications from the virus. She tolerated treatment and recovered well.   MRI of lumbar spine was performed on 12/03/2019 and showed a vertebral body lesion at L4 that was unchanged in size with slightly decreased contrast enhancement. There were no new lesions identified. The mild lower lumbar degenerative disc disease and moderate facet arthrosis was unchanged. There was no spinal canal stenosis or neural impingement.   She was last seen by Dr. Delton Coombes on 12/05/2019. Most recent myeloma labs did not show an M spike in the SPEP. She remains under observation and was given a prescription for Hydrocodone in addition to a referral to a back specialist for her ongoing low back pain.  On review of systems, the patient reports ongoing low back pain rated  7-8/10.  She also reports having some mild weakness in her left lower extremity but is able to ambulate.  Is accompanied by her husband on evaluation today            ALLERGIES:  is allergic to other.  Meds: Current Outpatient Medications  Medication Sig Dispense Refill  . acetaminophen (TYLENOL) 500 MG tablet Take 1,000 mg by mouth every 6 (six) hours as needed for headache (pain).  (Patient not taking: Reported on 12/05/2019)    . albuterol (PROVENTIL) (2.5 MG/3ML) 0.083% nebulizer solution Take 2.5 mg by nebulization every 6 (six) hours as needed for wheezing or shortness of breath. (Patient not taking: Reported on 12/05/2019)    . albuterol (VENTOLIN HFA) 108 (90 Base) MCG/ACT inhaler Inhale 1-2 puffs into the lungs every 4 (four) hours as needed for wheezing or shortness of breath. (Patient not taking: Reported on 12/05/2019) 18 g 0  . Albuterol Sulfate (PROAIR RESPICLICK) 378 (90 Base) MCG/ACT AEPB Inhale 2 puffs into the lungs every 4 (four) hours as needed (shortness of breath/ wheezing).    . chlorthalidone (HYGROTON) 25 MG tablet Take 1 tablet (25 mg total) by mouth daily. 90 tablet 3  . cycloSPORINE (RESTASIS) 0.05 % ophthalmic emulsion Place 1 drop into both eyes 2 (two) times daily.     Marland Kitchen diltiazem (CARDIZEM) 30 MG tablet Take 1 tablet (30 mg total) by mouth 2 (two) times daily. 60 tablet 11  . furosemide (LASIX) 20 MG tablet Take 1 tablet daily as needed may take 2 tablets prn (Patient not taking: Reported on 12/05/2019) 60  tablet 1  . gabapentin (NEURONTIN) 300 MG capsule Take 1 capsule (300 mg total) by mouth at bedtime. 30 capsule 1  . glipiZIDE-metformin (METAGLIP) 5-500 MG tablet Take 1 tablet by mouth in the morning and at bedtime.    Marland Kitchen HYDROcodone-acetaminophen (NORCO) 10-325 MG tablet Take 1 tablet by mouth 2 (two) times daily as needed for moderate pain (pain.). 60 tablet 0  . ibuprofen (ADVIL,MOTRIN) 800 MG tablet Take 1 tablet by mouth 2 (two) times daily as needed (back pain).   (Patient not taking: Reported on 12/05/2019)  2  . Insulin Glargine (BASAGLAR KWIKPEN) 100 UNIT/ML SMARTSIG:15 Unit(s) SUB-Q Every Night    . isosorbide mononitrate (IMDUR) 30 MG 24 hr tablet TAKE 1 TABLET (30 MG TOTAL) BY MOUTH DAILY. (Patient taking differently: Take 30 mg by mouth daily. ) 90 tablet 0  . metoprolol tartrate (LOPRESSOR) 100 MG tablet Take 100 mg by mouth 2 (two) times daily.    . mupirocin ointment (BACTROBAN) 2 % Apply 1 application topically 3 (three) times daily as needed.    . valACYclovir (VALTREX) 1000 MG tablet Take 1,000 mg by mouth 3 (three) times daily.     No current facility-administered medications for this encounter.    Physical Findings: The patient is in no acute distress. Patient is alert and oriented.  height is 5\' 6"  (1.676 m) and weight is 229 lb (103.9 kg). Her temperature is 97.7 F (36.5 C). Her blood pressure is 158/84 (abnormal) and her pulse is 71. Her respiration is 20 and oxygen saturation is 100%.  No significant changes. Lungs are clear to auscultation bilaterally. Heart has regular rate and rhythm. No palpable cervical, supraclavicular, or axillary adenopathy. Abdomen soft, non-tender, normal bowel sounds.  Patient was noted to have some mild weakness in the distal muscle groups of the left lower extremity.  This is been noticed on previous exams.  Lab Findings: Lab Results  Component Value Date   WBC 4.5 11/13/2019   HGB 11.3 (L) 11/13/2019   HCT 33.5 (L) 11/13/2019   MCV 89.8 11/13/2019   PLT 231 11/13/2019    Radiographic Findings: MR Lumbar Spine W Wo Contrast  Result Date: 12/04/2019 CLINICAL DATA:  L4 plasmacytoma EXAM: MRI LUMBAR SPINE WITHOUT AND WITH CONTRAST TECHNIQUE: Multiplanar and multiecho pulse sequences of the lumbar spine were obtained without and with intravenous contrast. CONTRAST:  14mL GADAVIST GADOBUTROL 1 MMOL/ML IV SOLN COMPARISON:  06/07/2019 and earlier FINDINGS: Segmentation:  Standard. Alignment:  Physiologic.  Vertebrae: Lesion within the superior half of the L4 vertebral body is unchanged in size. Contrast enhancement is slightly decreased. There are no new lesions. No vertebral body height loss or fracture. Conus medullaris and cauda equina: Conus extends to the L1 level. Conus and cauda equina appear normal. Paraspinal and other soft tissues: Negative Disc levels: L3-4: Moderate facet arthrosis, unchanged. Small disc bulge. No spinal canal or neural foraminal stenosis. L4-5: Moderate facet arthrosis, unchanged. Small disc bulge. No spinal canal or neural foraminal stenosis. L5-S1: Moderate facet arthrosis with small disc bulge, unchanged. No spinal canal or neural foraminal stenosis. IMPRESSION: 1. Unchanged size of L4 vertebral body lesion, with slightly decreased contrast enhancement. No new lesions. 2. Unchanged mild lower lumbar degenerative disc disease and moderate facet arthrosis. No spinal canal stenosis or neural impingement. Electronically Signed   By: Ulyses Jarred M.D.   On: 12/04/2019 00:29    Impression: Plasmocytoma of L4 vertebral body  Recent MRI of lumbar spine was stable with some decreased contrast  enhancement.  No new lesions.  Unfortunately the patient has no improvement in her pain.  She does report some pain radiating into her left lower extremity.  Plan: The patient is scheduled to see Dr. Delton Coombes on 03/16/2020. She will follow-up with radiation oncology in 3 months.  She has been referred to pain management specialist by medical oncology  Total time spent in this encounter was 15 minutes which included reviewing the patient's most recent ED visits, follow-up with Dr. Delton Coombes, MRI of lumbar spine, physical examination, and documentation.  ____________________________________  Blair Promise, PhD, MD  This document serves as a record of services personally performed by Gery Pray, MD. It was created on his behalf by Clerance Lav, a trained medical scribe. The creation of  this record is based on the scribe's personal observations and the provider's statements to them. This document has been checked and approved by the attending provider.

## 2020-01-02 ENCOUNTER — Other Ambulatory Visit: Payer: Self-pay | Admitting: Radiation Oncology

## 2020-01-02 MED ORDER — GABAPENTIN 300 MG PO CAPS: 300.0000 mg | ORAL_CAPSULE | Freq: Every day | ORAL | 2 refills | Status: DC

## 2020-01-03 ENCOUNTER — Ambulatory Visit: Payer: No Typology Code available for payment source | Admitting: Cardiology

## 2020-01-09 ENCOUNTER — Other Ambulatory Visit: Payer: Self-pay

## 2020-01-09 ENCOUNTER — Encounter
Payer: PRIVATE HEALTH INSURANCE | Attending: Physical Medicine and Rehabilitation | Admitting: Physical Medicine and Rehabilitation

## 2020-01-09 ENCOUNTER — Encounter: Payer: Self-pay | Admitting: Physical Medicine and Rehabilitation

## 2020-01-09 VITALS — BP 163/91 | HR 60 | Temp 98.6°F | Ht 66.0 in | Wt 229.3 lb

## 2020-01-09 DIAGNOSIS — R5382 Chronic fatigue, unspecified: Secondary | ICD-10-CM | POA: Insufficient documentation

## 2020-01-09 MED ORDER — LIDOCAINE 5 % EX PTCH: 1.0000 | MEDICATED_PATCH | CUTANEOUS | 0 refills | Status: DC

## 2020-01-09 MED ORDER — DICLOFENAC SODIUM 1 % EX GEL: 2.0000 g | Freq: Four times a day (QID) | CUTANEOUS | 3 refills | Status: DC

## 2020-01-09 MED ORDER — GABAPENTIN 300 MG PO CAPS: 300.0000 mg | ORAL_CAPSULE | Freq: Every day | ORAL | 2 refills | Status: DC

## 2020-01-09 NOTE — Progress Notes (Signed)
Subjective:    Patient ID: Hannah Cooper, female    DOB: April 16, 1956, 64 y.o.   MRN: [id number]  HPI  Hannah Cooper is a 64 year old woman who presents to establish care for lower back pain where Hannah Cooper has a spinal tumor. Hannah Cooper prefers pain medication to surgery. When Hannah Cooper walks down the stairs it hurts. When Hannah Cooper walked to the school with Hannah Cooper daughter it was awful. Hannah Cooper was given a medication which helps to relieve the pain. Hannah Cooper has follow-up with oncology in 3 months. Average pain is 10/10. Hannah Cooper tries to bear the pain has much as possible because Hannah Cooper has children- they are 14 and 6, 12 and 13. Hannah Cooper has adopted the three when they were infants.   Hannah Cooper says Hannah Cooper eats a normal diet.  Exercise limited due to Hannah Cooper pain.   Hannah Cooper is accompanied by Hannah Cooper husband today.  Pain medications reviewed with Hannah Cooper and Hannah Cooper tries to use the ibuprofen and Tylenol sparingly (latter does not help much).   Pain Inventory Average Pain 10 Pain Right Now 7 My pain is sharp, stabbing and aching  In the last 24 hours, has pain interfered with the following? General activity 7 Relation with others 7 Enjoyment of life 7 What TIME of day is your pain at its worst? varies Sleep (in general) Good  Pain is worse with: walking, sitting and some activites Pain improves with: rest, therapy/exercise and medication Relief from Meds: 7  use a cane use a walker how many minutes can you walk? 1 ability to climb steps?  yes do you drive?  yes use a wheelchair  retired I need assistance with the following:  household duties and shopping  numbness trouble walking  New pt  New pt    Family History  Problem Relation Age of Onset  . Heart attack Mother 73  . Heart attack Sister 59   Social History   Socioeconomic History  . Marital status: Married    Spouse name: Not on file  . Number of children: Not on file  . Years of education: Not on file  . Highest education level: Not on file  Occupational History  . Not  on file  Tobacco Use  . Smoking status: Never Smoker  . Smokeless tobacco: Never Used  Vaping Use  . Vaping Use: Never used  Substance and Sexual Activity  . Alcohol use: No    Alcohol/week: 0.0 standard drinks  . Drug use: No  . Sexual activity: Yes    Partners: Male  Other Topics Concern  . Not on file  Social History Narrative  . Not on file   Social Determinants of Health   Financial Resource Strain: Low Risk   . Difficulty of Paying Living Expenses: Not hard at all  Food Insecurity: No Food Insecurity  . Worried About Charity fundraiser in the Last Year: Never true  . Ran Out of Food in the Last Year: Never true  Transportation Needs: No Transportation Needs  . Lack of Transportation (Medical): No  . Lack of Transportation (Non-Medical): No  Physical Activity: Inactive  . Days of Exercise per Week: 0 days  . Minutes of Exercise per Session: 0 min  Stress: Stress Concern Present  . Feeling of Stress : To some extent  Social Connections: Socially Integrated  . Frequency of Communication with Friends and Family: More than three times a week  . Frequency of Social Gatherings with Friends and Family: Never  . Attends  Religious Services: More than 4 times per year  . Active Member of Clubs or Organizations: Yes  . Attends Archivist Meetings: More than 4 times per year  . Marital Status: Married   Past Surgical History:  Procedure Laterality Date  . ABDOMINAL HYSTERECTOMY    . IR FLUORO GUIDED NEEDLE PLC ASPIRATION/INJECTION LOC  06/19/2019  . LEFT HEART CATHETERIZATION WITH CORONARY ANGIOGRAM N/A 09/05/2012   Procedure: LEFT HEART CATHETERIZATION WITH CORONARY ANGIOGRAM;  Surgeon: Wellington Hampshire, MD;  Location: [facility] CATH LAB;  Service: Cardiovascular;  Laterality: N/A;  . THYROID SURGERY  2002   Past Medical History:  Diagnosis Date  . Arthritis   . Atypical chest pain    chronic  . Cancer ([place])    bone cancer  . Chronic back pain   . Chronic diastolic  CHF (congestive heart failure) ([place])   . COPD (chronic obstructive pulmonary disease) ([place])   . DDD (degenerative disc disease), cervical   . Hyperlipidemia   . Hypertension   . Lumbar radiculopathy   . Meningitis   . Normal coronary arteries 2014  . Palpitations   . Premature atrial contractions   . PVC's (premature ventricular contractions)    BP (!) 163/91   Pulse 60   Temp 98.6 F (37 C)   Ht 5\' 6"  (1.676 m)   Wt 229 lb 4.8 oz (104 kg)   SpO2 92%   BMI 37.01 kg/m   Opioid Risk Score:   Fall Risk Score:  `1  Depression screen PHQ 2/9  Depression screen [facility] 2/9 01/09/2020 03/31/2015  Decreased Interest 1 0  Down, Depressed, Hopeless 0 1  PHQ - 2 Score 1 1  Altered sleeping 0 0  Tired, decreased energy 1 2  Change in appetite 0 1  Feeling bad or failure about yourself  0 1  Trouble concentrating 0 0  Moving slowly or fidgety/restless 0 0  Suicidal thoughts 0 0  PHQ-9 Score 2 5  Difficult doing work/chores Not difficult at all Somewhat difficult  Some recent data might be hidden    Review of Systems  Musculoskeletal: Positive for gait problem.  Neurological: Positive for numbness.  All other systems reviewed and are negative.      Objective:   Physical Exam  Gen: no distress, normal appearing HEENT: oral mucosa pink and moist, NCAT Cardio: Reg rate Chest: normal effort, normal rate of breathing Abd: soft, non-distended Ext: no edema Skin: Dry skin over radiation site on spine Neuro: Alert and oriented x3 Musculoskeletal: left leg with pronounced weakness: 4/5 HF, 4-/5 KE, 3/5 DF and PF Psych: pleasant, normal affect    Assessment & Plan:  Hannah Cooper is a 64 year old woman who presents to establish care for pain secondary to spinal tumor.  1) Chronic Pain Syndrome secondary to spinal tumor (solitary plasmacytoma- Dr. Tomie China note reviewed).  -Discussed current symptoms of pain and history of pain.  -Discussed following foods that may reduce  pain: 1) Ginger 2) Blueberries 3) Salmon 4) Pumpkin seeds 5) dark chocolate 6) turmeric 7) tart cherries 8) virgin olive oil 9) chilli peppers 10) mint 11) red wine -Made goal to incorporate these foods into Hannah Cooper diet.  -Discussed vitamin deficiencies associated with pain: Vitamin D- has not been checked. Ordered lab today -Continue ice to lower back BID.  -Increase gabapentin TID -Use Ibuprofen during the day if Hannah Cooper has pain (not sedating) -Minimize narcotic use at this time due to risks of tolerance- can maximize more  conservative agents first -Prescribed lidocaine patch and diclofenac gel.   2) Cancer related fatigue: Iron level ordered and treatment options discussed  3) Dry skin at radiation site: Apply coconut oil.   All questions answered. RTC in 5 weeks.

## 2020-01-09 NOTE — Patient Instructions (Signed)
  1) Ginger 2) Blueberries 3) Salmon 4) Pumpkin seeds 5) dark chocolate 6) turmeric 7) tart cherries 8) virgin olive oil 9) chilli peppers 10) mint 11) red wine 

## 2020-01-14 ENCOUNTER — Telehealth: Payer: Self-pay | Admitting: *Deleted

## 2020-01-14 NOTE — Telephone Encounter (Signed)
[name] from [facility] needs additional  diagnostic codes for the blood work that was ordered. The current code provided is not covering requested labs. Please contact [facility] and provide alternative codes.  [place], [place], [phone]

## 2020-01-15 NOTE — Telephone Encounter (Signed)
Notified. 

## 2020-01-15 NOTE — Telephone Encounter (Signed)
Can you please let her know the diagnosis is solitary plasmacytoma C90.3?

## 2020-01-15 NOTE — Telephone Encounter (Signed)
I spoke with [name] and because she is covered by medicare, chronic fatigue is not a supported diagnosis code.

## 2020-01-21 ENCOUNTER — Other Ambulatory Visit: Payer: Self-pay | Admitting: Physical Medicine and Rehabilitation

## 2020-01-21 LAB — IRON: Iron: 61 ug/dL (ref 45–160)

## 2020-01-21 LAB — VITAMIN D 25 HYDROXY (VIT D DEFICIENCY, FRACTURES): Vit D, 25-Hydroxy: 10 ng/mL — ABNORMAL LOW (ref 30–100)

## 2020-01-22 ENCOUNTER — Other Ambulatory Visit: Payer: Self-pay | Admitting: Physical Medicine and Rehabilitation

## 2020-01-22 MED ORDER — IRON 325 (65 FE) MG PO TABS: 1.0000 | ORAL_TABLET | Freq: Every day | ORAL | 0 refills | Status: DC

## 2020-01-22 MED ORDER — VITAMIN D (ERGOCALCIFEROL) 1.25 MG (50000 UNIT) PO CAPS: 50000.0000 [IU] | ORAL_CAPSULE | ORAL | 0 refills | Status: DC

## 2020-01-29 NOTE — Progress Notes (Signed)
Cardiology Office Note    Date:  02/03/2020   ID:  Hannah Cooper, DOB [birth year], MRN [id number]  PCP:  Jake Samples, PA-C  Cardiologist: Carlyle Dolly, MD EPS: None  No chief complaint on file.   History of Present Illness:  Hannah Cooper is a 64 y.o. female with history of HTN, palpitations-monitor 2018 PAC and PVC's on lopressor and diltiazem, history of atypical chest pain normal cardiac cath 2014, COPD, chronic diastolic CHF Echo 5366 LVEF 55 to 60% with abnormal diastolic function, dyspnea CPX 06/2014 mild to moderate reduced functional capacity  Patient was diagnosed with a spinal tumor solitary plasmacytoma and undergoing radiation.  Covid + 10/2019 after being vaccinated.   Patient said the other day she became aggravated and heart started racing. BP running really high, legs swelling and having to take lasix more often. Having trouble since covid19. Her husband was hospitalized with it and very sick after being vaccinated. Didn't go to the infusion clinic.  Past Medical History:  Diagnosis Date  . Arthritis   . Atypical chest pain    chronic  . Cancer ([place])    bone cancer  . Chronic back pain   . Chronic diastolic CHF (congestive heart failure) ([place])   . COPD (chronic obstructive pulmonary disease) ([place])   . DDD (degenerative disc disease), cervical   . Hyperlipidemia   . Hypertension   . Lumbar radiculopathy   . Meningitis   . Normal coronary arteries 2014  . Palpitations   . Premature atrial contractions   . PVC's (premature ventricular contractions)     Past Surgical History:  Procedure Laterality Date  . ABDOMINAL HYSTERECTOMY    . IR FLUORO GUIDED NEEDLE PLC ASPIRATION/INJECTION LOC  06/19/2019  . LEFT HEART CATHETERIZATION WITH CORONARY ANGIOGRAM N/A 09/05/2012   Procedure: LEFT HEART CATHETERIZATION WITH CORONARY ANGIOGRAM;  Surgeon: Wellington Hampshire, MD;  Location: [facility] CATH LAB;  Service: Cardiovascular;  Laterality: N/A;  . THYROID  SURGERY  2002    Current Medications: Current Meds  Medication Sig  . acetaminophen (TYLENOL) 500 MG tablet Take 1,000 mg by mouth every 6 (six) hours as needed for headache (pain).   Marland Kitchen albuterol (PROVENTIL) (2.5 MG/3ML) 0.083% nebulizer solution Take 2.5 mg by nebulization every 6 (six) hours as needed for wheezing or shortness of breath.   Marland Kitchen albuterol (VENTOLIN HFA) 108 (90 Base) MCG/ACT inhaler Inhale 1-2 puffs into the lungs every 4 (four) hours as needed for wheezing or shortness of breath.  . Albuterol Sulfate (PROAIR RESPICLICK) 440 (90 Base) MCG/ACT AEPB Inhale 2 puffs into the lungs every 4 (four) hours as needed (shortness of breath/ wheezing).  . chlorthalidone (HYGROTON) 25 MG tablet Take 1 tablet (25 mg total) by mouth daily.  . cycloSPORINE (RESTASIS) 0.05 % ophthalmic emulsion Place 1 drop into both eyes 2 (two) times daily.   . diclofenac Sodium (VOLTAREN) 1 % GEL Apply 2 g topically 4 (four) times daily.  Marland Kitchen diltiazem (CARDIZEM) 30 MG tablet Take 1 tablet (30 mg total) by mouth 2 (two) times daily.  . Ferrous Sulfate (IRON) 325 (65 Fe) MG TABS Take 1 tablet (325 mg total) by mouth daily.  . furosemide (LASIX) 20 MG tablet Take 1 tablet daily as needed may take 2 tablets prn  . gabapentin (NEURONTIN) 300 MG capsule Take 1 capsule (300 mg total) by mouth at bedtime.  Marland Kitchen glipiZIDE-metformin (METAGLIP) 5-500 MG tablet Take 1 tablet by mouth in the morning and at bedtime.  Marland Kitchen  HYDROcodone-acetaminophen (NORCO) 10-325 MG tablet Take 1 tablet by mouth 2 (two) times daily as needed for moderate pain (pain.).  Marland Kitchen ibuprofen (ADVIL,MOTRIN) 800 MG tablet Take 1 tablet by mouth 2 (two) times daily as needed (back pain).   . Insulin Glargine (BASAGLAR KWIKPEN) 100 UNIT/ML SMARTSIG:15 Unit(s) SUB-Q Every Night  . isosorbide mononitrate (IMDUR) 30 MG 24 hr tablet TAKE 1 TABLET (30 MG TOTAL) BY MOUTH DAILY.  Marland Kitchen lidocaine (LIDODERM) 5 % Place 1 patch onto the skin daily. Remove & Discard patch within  12 hours or as directed by MD  . metoprolol tartrate (LOPRESSOR) 100 MG tablet Take 100 mg by mouth 2 (two) times daily.  . mupirocin ointment (BACTROBAN) 2 % Apply 1 application topically 3 (three) times daily as needed.  . valACYclovir (VALTREX) 1000 MG tablet Take 1,000 mg by mouth 3 (three) times daily.  . Vitamin D, Ergocalciferol, (DRISDOL) 1.25 MG (50000 UNIT) CAPS capsule Take 1 capsule (50,000 Units total) by mouth every 7 (seven) days.     Allergies:   Other   Social History   Socioeconomic History  . Marital status: Married    Spouse name: Not on file  . Number of children: Not on file  . Years of education: Not on file  . Highest education level: Not on file  Occupational History  . Not on file  Tobacco Use  . Smoking status: Never Smoker  . Smokeless tobacco: Never Used  Vaping Use  . Vaping Use: Never used  Substance and Sexual Activity  . Alcohol use: No    Alcohol/week: 0.0 standard drinks  . Drug use: No  . Sexual activity: Yes    Partners: Male  Other Topics Concern  . Not on file  Social History Narrative  . Not on file   Social Determinants of Health   Financial Resource Strain: Low Risk   . Difficulty of Paying Living Expenses: Not hard at all  Food Insecurity: No Food Insecurity  . Worried About Charity fundraiser in the Last Year: Never true  . Ran Out of Food in the Last Year: Never true  Transportation Needs: No Transportation Needs  . Lack of Transportation (Medical): No  . Lack of Transportation (Non-Medical): No  Physical Activity: Inactive  . Days of Exercise per Week: 0 days  . Minutes of Exercise per Session: 0 min  Stress: Stress Concern Present  . Feeling of Stress : To some extent  Social Connections: Socially Integrated  . Frequency of Communication with Friends and Family: More than three times a week  . Frequency of Social Gatherings with Friends and Family: Never  . Attends Religious Services: More than 4 times per year  .  Active Member of Clubs or Organizations: Yes  . Attends Archivist Meetings: More than 4 times per year  . Marital Status: Married     Family History:  The patient's family history includes Heart attack (age of onset: 68) in her sister; Heart attack (age of onset: 26) in her mother.   ROS:   Please see the history of present illness.    ROS All other systems reviewed and are negative.   PHYSICAL EXAM:   VS:  BP (!) 162/100   Pulse 89   Ht 5\' 5"  (1.651 m)   Wt 227 lb (103 kg)   SpO2 97%   BMI 37.77 kg/m   Physical Exam  GEN: Obese, in no acute distress  Neck: no JVD, carotid bruits, or  masses Cardiac:RRR; no murmurs, rubs, or gallops  Respiratory:  clear to auscultation bilaterally, normal work of breathing GI: soft, nontender, nondistended, + BS Ext: without cyanosis, clubbing, or edema, Good distal pulses bilaterally Neuro:  Alert and Oriented x 3 Psych: euthymic mood, full affect  Wt Readings from Last 3 Encounters:  02/03/20 227 lb (103 kg)  01/09/20 229 lb 4.8 oz (104 kg)  12/30/19 229 lb (103.9 kg)      Studies/Labs Reviewed:   EKG:  EKG is not ordered today.   Recent Labs: 11/13/2019: ALT 36; BUN 9; Creatinine, Ser 0.80; Hemoglobin 11.3; Platelets 231; Potassium 2.3; Sodium 135   Lipid Panel    Component Value Date/Time   CHOL 123 03/14/2016 0739   TRIG 66 11/13/2019 1355   HDL 46 03/14/2016 0739   CHOLHDL 2.7 03/14/2016 0739   VLDL 17 03/14/2016 0739   LDLCALC 60 03/14/2016 0739    Additional studies/ records that were reviewed today include:    08/2012 cath Hemodynamics: AO:  164/82   mmHg LV:  169/14    mmHg LVEDP: 24  mmHg   Coronary angiography: Coronary dominance: Right     Left Main:  Normal  Left Anterior Descending (LAD):  Normal in size with no significant disease.  1st diagonal (D1):  Small in size with minor irregularities.  2nd diagonal (D2):  Normal in size with no significant disease.  3rd diagonal (D3):  Normal in  size with no significant disease.  Circumflex (LCx):  Normal in size and nondominant. The vessel has no significant disease.  1st obtuse marginal:  Small in size with no significant disease.  2nd obtuse marginal:  Small in size with no significant disease.  3rd obtuse marginal:  Large in size with no significant disease.    Right Coronary Artery: Normal in size and dominant. The vessel has no significant disease.  posterior descending artery: Normal in size with no significant disease.  posterior lateral branchs:  Normal in size with no significant disease.   Left ventriculography: Left ventricular systolic function is normal , LVEF is estimated at 60-65% %, there is no significant mitral regurgitation    Final Conclusions:    1. Normal coronary arteries. 2. Normal LV systolic function. 3. Moderately elevated left ventricular end-diastolic pressure likely due to diastolic heart failure.     06/2013 echo Study Conclusions  - Procedure narrative: Transthoracic echocardiography. Image   quality was suboptimal. The study was technically   difficult, as a result of poor sound wave transmission and   restricted patient mobility. - Left ventricle: The cavity size was normal. Wall thickness   was increased in a pattern of mild to moderate LVH.   Diastolic dysfunction noted, grade indeterminant. Systolic   function was normal. The estimated ejection fraction was   in the range of 55% to 60%. Wall motion was normal; there   were no regional wall motion abnormalities.     Jan 2017 Event monitor: no arrhythmias   06/2014 CPX Conclusion: Exercise testing with gas exchange demonstrates a mild-moderately reduced functional capacity when compared to matched sedentary norms. There was an in-adequate HR response to the exercise response and flat O2 pulse. This high HR reserve was likely high due to pulmonary limitations based on restrictive spirometry and RR 45 at peak exercise and  reaching maximum ventilatory limits. However, it could have easily been due to poor effort. The sub-maximal effort is clouding interpretation of the test. Nevertheless, correlation of resting spirometry test  Showing  restriction with outpatient pulmonary function test is warranted     05/2016 heart monitor  Telemetry tracings predominately show sinus rhythm  Reported symptoms correlate with occasional PVCs and PACs       02/2016 echo Study Conclusions   - Left ventricle: The cavity size was normal. Systolic function was   vigorous. The estimated ejection fraction was in the range of 65%   to 70%. Wall motion was normal; there were no regional wall   motion abnormalities. There was an increased relative   contribution of atrial contraction to ventricular filling.   Doppler parameters are consistent with abnormal left ventricular   relaxation (grade 1 diastolic dysfunction). - Pulmonic valve: There was trivial regurgitation. - Pulmonary arteries: Systolic pressure could not be accurately   estimated.     ASSESSMENT:    1. Palpitations   2. Essential hypertension   3. Chest pain, unspecified type   4. Chronic diastolic CHF (congestive heart failure) ([place])   5. Edema due to congestive heart failure ([facility])      PLAN:  In order of problems listed above:  Palpitations on metoprolol and diltiazem-had palpitations the other day when she was aggravated. Can take extra diltiazem as needed  Hypertension BP very high recently associated with leg swelling. Will add losartan, f/u bmet and echo  History of chest pain with normal cardiac catheter in 7412  Chronic diastolic CHF taking more lasix recently for leg swelling. Watching diet closer. Will check echo      Medication Adjustments/Labs and Tests Ordered: Current medicines are reviewed at length with the patient today.  Concerns regarding medicines are outlined above.  Medication changes, Labs and Tests ordered today are  listed in the Patient Instructions below. Patient Instructions  Medication Instructions:  Your physician has recommended you make the following change in your medication:   START: Losartan 25mg  daily *YOU MAY TAKE AN EXTRA DILTIAZEM AS NEEDED FOR PALPITATIONS*  *If you need a refill on your cardiac medications before your next appointment, please call your pharmacy*   Lab Work: TODAY: BMET BMET in 2 weeks  If you have labs (blood work) drawn today and your tests are completely normal, you will receive your results only by: Marland Kitchen MyChart Message (if you have MyChart) OR . A paper copy in the mail If you have any lab test that is abnormal or we need to change your treatment, we will call you to review the results.   Testing/Procedures: Your physician has requested that you have an echocardiogram. Echocardiography is a painless test that uses sound waves to create images of your heart. It provides your doctor with information about the size and shape of your heart and how well your heart's chambers and valves are working. This procedure takes approximately one hour. There are no restrictions for this procedure.     Follow-Up: At [facility], you and your health needs are our priority.  As part of our continuing mission to provide you with exceptional heart care, we have created designated Provider Care Teams.  These Care Teams include your primary Cardiologist (physician) and Advanced Practice Providers (APPs -  Physician Assistants and Nurse Practitioners) who all work together to provide you with the care you need, when you need it.  We recommend signing up for the patient portal called "MyChart".  Sign up information is provided on this After Visit Summary.  MyChart is used to connect with patients for Virtual Visits (Telemedicine).  Patients are able to view lab/test results,  encounter notes, upcoming appointments, etc.  Non-urgent messages can be sent to your provider as well.   To  learn more about what you can do with MyChart, go to [email or web address].    Your next appointment:   02/18/2020  The format for your next appointment:   In Person  Provider:   Ermalinda Barrios, PA-C       Signed, Ermalinda Barrios, PA-C  02/03/2020 11:50 AM    Hannah Cooper, [place], [place]  [address] Phone: [phone]; Fax: [phone]

## 2020-02-03 ENCOUNTER — Other Ambulatory Visit: Payer: Self-pay

## 2020-02-03 ENCOUNTER — Ambulatory Visit (INDEPENDENT_AMBULATORY_CARE_PROVIDER_SITE_OTHER): Payer: No Typology Code available for payment source | Admitting: Physician Assistant

## 2020-02-03 ENCOUNTER — Encounter: Payer: Self-pay | Admitting: Physician Assistant

## 2020-02-03 VITALS — BP 162/100 | HR 89 | Ht 65.0 in | Wt 227.0 lb

## 2020-02-03 DIAGNOSIS — I10 Essential (primary) hypertension: Secondary | ICD-10-CM

## 2020-02-03 DIAGNOSIS — I5032 Chronic diastolic (congestive) heart failure: Secondary | ICD-10-CM

## 2020-02-03 DIAGNOSIS — R002 Palpitations: Secondary | ICD-10-CM

## 2020-02-03 DIAGNOSIS — R079 Chest pain, unspecified: Secondary | ICD-10-CM | POA: Diagnosis not present

## 2020-02-03 DIAGNOSIS — I509 Heart failure, unspecified: Secondary | ICD-10-CM

## 2020-02-03 MED ORDER — LOSARTAN POTASSIUM 25 MG PO TABS
25.0000 mg | ORAL_TABLET | Freq: Every day | ORAL | 3 refills | Status: DC
Start: 2020-02-03 — End: 2020-02-18

## 2020-02-03 NOTE — Patient Instructions (Signed)
Medication Instructions:  Your physician has recommended you make the following change in your medication:   START: Losartan 25mg  daily *YOU MAY TAKE AN EXTRA DILTIAZEM AS NEEDED FOR PALPITATIONS*  *If you need a refill on your cardiac medications before your next appointment, please call your pharmacy*   Lab Work: TODAY: BMET BMET in 2 weeks  If you have labs (blood work) drawn today and your tests are completely normal, you will receive your results only by: Marland Kitchen MyChart Message (if you have MyChart) OR . A paper copy in the mail If you have any lab test that is abnormal or we need to change your treatment, we will call you to review the results.   Testing/Procedures: Your physician has requested that you have an echocardiogram. Echocardiography is a painless test that uses sound waves to create images of your heart. It provides your doctor with information about the size and shape of your heart and how well your heart's chambers and valves are working. This procedure takes approximately one hour. There are no restrictions for this procedure.     Follow-Up: At [facility], you and your health needs are our priority.  As part of our continuing mission to provide you with exceptional heart care, we have created designated Provider Care Teams.  These Care Teams include your primary Cardiologist (physician) and Advanced Practice Providers (APPs -  Physician Assistants and Nurse Practitioners) who all work together to provide you with the care you need, when you need it.  We recommend signing up for the patient portal called "MyChart".  Sign up information is provided on this After Visit Summary.  MyChart is used to connect with patients for Virtual Visits (Telemedicine).  Patients are able to view lab/test results, encounter notes, upcoming appointments, etc.  Non-urgent messages can be sent to your provider as well.   To learn more about what you can do with MyChart, go to  [email or web address].    Your next appointment:   02/18/2020  The format for your next appointment:   In Person  Provider:   Ermalinda Barrios, PA-C

## 2020-02-05 ENCOUNTER — Other Ambulatory Visit: Payer: Self-pay

## 2020-02-05 ENCOUNTER — Ambulatory Visit (HOSPITAL_COMMUNITY)
Admission: RE | Admit: 2020-02-05 | Discharge: 2020-02-05 | Disposition: A | Discharge status: Home / Self Care | Payer: PRIVATE HEALTH INSURANCE | Source: Ambulatory Visit | Attending: Physician Assistant | Admitting: Physician Assistant

## 2020-02-05 DIAGNOSIS — I5032 Chronic diastolic (congestive) heart failure: Secondary | ICD-10-CM | POA: Diagnosis present

## 2020-02-05 DIAGNOSIS — I509 Heart failure, unspecified: Secondary | ICD-10-CM | POA: Diagnosis present

## 2020-02-05 LAB — ECHOCARDIOGRAM COMPLETE
Area-P 1/2: 2.87 cm2
S' Lateral: 1.81 cm

## 2020-02-05 LAB — BASIC METABOLIC PANEL
BUN: 10 mg/dL (ref 7–25)
CO2: 30 mmol/L (ref 20–32)
Calcium: 9.8 mg/dL (ref 8.6–10.4)
Chloride: 101 mmol/L (ref 98–110)
Creat: 0.83 mg/dL (ref 0.50–0.99)
Glucose, Bld: 112 mg/dL (ref 65–139)
Potassium: 3.9 mmol/L (ref 3.5–5.3)
Sodium: 140 mmol/L (ref 135–146)

## 2020-02-05 NOTE — Progress Notes (Signed)
*  PRELIMINARY RESULTS* Echocardiogram 2D Echocardiogram has been performed.  Hannah Cooper 02/05/2020, 10:19 AM

## 2020-02-09 ENCOUNTER — Other Ambulatory Visit: Payer: Self-pay | Admitting: Physical Medicine and Rehabilitation

## 2020-02-11 NOTE — Progress Notes (Signed)
Cardiology Office Note    Date:  02/18/2020   ID:  BECKEY POLKOWSKI, DOB 1956-02-19, MRN [id number]  PCP:  Jake Samples, PA-C  Cardiologist: Carlyle Dolly, MD EPS: None  Chief Complaint  Patient presents with  . Follow-up    History of Present Illness:  Hannah Cooper is a 64 y.o. female with history of HTN, palpitations-monitor 2018 PAC and PVC's on lopressor and diltiazem, history of atypical chest pain normal cardiac cath 2014, COPD, chronic diastolic CHF Echo 8527 LVEF 55 to 60% with abnormal diastolic function, dyspnea CPX 06/2014 mild to moderate reduced functional capacity   Patient was diagnosed with a spinal tumor solitary plasmacytoma and undergoing radiation.  Covid + 10/2019 after being vaccinated.    I saw the patient 02/03/2020 at which time her blood pressure was running high, legs were swelling and she  was taking Lasix more often.  Heart was also racing when she became aggravated. Echo 02/05/20 normal LVEF 65-70% mild LVH  Patient comes in today for f/u. Under a lot of stress helping take care of her great great grandchildren in addition to her own. BP still running high at home similar to today but doing better. Trying to watch her salt. Swelling a little better. She says she's taking 2 different losartan pills because she had an old Rx. Not sure of dose.  Past Medical History:  Diagnosis Date  . Arthritis   . Atypical chest pain    chronic  . Cancer ([place])    bone cancer  . Chronic back pain   . Chronic diastolic CHF (congestive heart failure) ([place])   . COPD (chronic obstructive pulmonary disease) ([place])   . DDD (degenerative disc disease), cervical   . Hyperlipidemia   . Hypertension   . Lumbar radiculopathy   . Meningitis   . Normal coronary arteries 2014  . Palpitations   . Premature atrial contractions   . PVC's (premature ventricular contractions)     Past Surgical History:  Procedure Laterality Date  . ABDOMINAL HYSTERECTOMY    . IR  FLUORO GUIDED NEEDLE PLC ASPIRATION/INJECTION LOC  06/19/2019  . LEFT HEART CATHETERIZATION WITH CORONARY ANGIOGRAM N/A 09/05/2012   Procedure: LEFT HEART CATHETERIZATION WITH CORONARY ANGIOGRAM;  Surgeon: Wellington Hampshire, MD;  Location: [facility];  Service: Cardiovascular;  Laterality: N/A;  . THYROID SURGERY  2002    Current Medications: Current Meds  Medication Sig  . acetaminophen (TYLENOL) 500 MG tablet Take 1,000 mg by mouth every 6 (six) hours as needed for headache (pain).   Marland Kitchen albuterol (PROVENTIL) (2.5 MG/3ML) 0.083% nebulizer solution Take 2.5 mg by nebulization every 6 (six) hours as needed for wheezing or shortness of breath.   Marland Kitchen albuterol (VENTOLIN HFA) 108 (90 Base) MCG/ACT inhaler Inhale 1-2 puffs into the lungs every 4 (four) hours as needed for wheezing or shortness of breath.  . Albuterol Sulfate (PROAIR RESPICLICK) 782 (90 Base) MCG/ACT AEPB Inhale 2 puffs into the lungs every 4 (four) hours as needed (shortness of breath/ wheezing).  . cycloSPORINE (RESTASIS) 0.05 % ophthalmic emulsion Place 1 drop into both eyes 2 (two) times daily.   . diclofenac Sodium (VOLTAREN) 1 % GEL Apply 2 g topically 4 (four) times daily.  Marland Kitchen diltiazem (CARDIZEM) 30 MG tablet Take 1 tablet (30 mg total) by mouth 2 (two) times daily.  . Ferrous Sulfate (IRON) 325 (65 Fe) MG TABS Take 1 tablet (325 mg total) by mouth daily.  . furosemide (LASIX) 20 MG  tablet Take 1 tablet daily as needed may take 2 tablets prn  . gabapentin (NEURONTIN) 300 MG capsule Take 1 capsule (300 mg total) by mouth at bedtime.  Marland Kitchen glipiZIDE-metformin (METAGLIP) 5-500 MG tablet Take 1 tablet by mouth in the morning and at bedtime.  Marland Kitchen HYDROcodone-acetaminophen (NORCO) 10-325 MG tablet Take 1 tablet by mouth 2 (two) times daily as needed for moderate pain (pain.).  Marland Kitchen ibuprofen (ADVIL,MOTRIN) 800 MG tablet Take 1 tablet by mouth 2 (two) times daily as needed (back pain).   . Insulin Glargine (BASAGLAR KWIKPEN) 100 UNIT/ML  SMARTSIG:15 Unit(s) SUB-Q Every Night  . isosorbide mononitrate (IMDUR) 30 MG 24 hr tablet TAKE 1 TABLET (30 MG TOTAL) BY MOUTH DAILY.  Marland Kitchen lidocaine (LIDODERM) 5 % Place 2 patches onto the skin daily. Remove & Discard patch within 12 hours or as directed by MD  . losartan (COZAAR) 25 MG tablet Take 1 tablet (25 mg total) by mouth daily.  Marland Kitchen losartan (COZAAR) 50 MG tablet Take 1 tablet (50 mg total) by mouth daily.  . metoprolol tartrate (LOPRESSOR) 100 MG tablet Take 100 mg by mouth 2 (two) times daily.  . mupirocin ointment (BACTROBAN) 2 % Apply 1 application topically 3 (three) times daily as needed.  . valACYclovir (VALTREX) 1000 MG tablet Take 1,000 mg by mouth 3 (three) times daily.  . Vitamin D, Ergocalciferol, (DRISDOL) 1.25 MG (50000 UNIT) CAPS capsule Take 1 capsule (50,000 Units total) by mouth every 7 (seven) days.     Allergies:   Other   Social History   Socioeconomic History  . Marital status: Married    Spouse name: Not on file  . Number of children: Not on file  . Years of education: Not on file  . Highest education level: Not on file  Occupational History  . Not on file  Tobacco Use  . Smoking status: Never Smoker  . Smokeless tobacco: Never Used  Vaping Use  . Vaping Use: Never used  Substance and Sexual Activity  . Alcohol use: No    Alcohol/week: 0.0 standard drinks  . Drug use: No  . Sexual activity: Yes    Partners: Male  Other Topics Concern  . Not on file  Social History Narrative  . Not on file   Social Determinants of Health   Financial Resource Strain: Low Risk   . Difficulty of Paying Living Expenses: Not hard at all  Food Insecurity: No Food Insecurity  . Worried About Charity fundraiser in the Last Year: Never true  . Ran Out of Food in the Last Year: Never true  Transportation Needs: No Transportation Needs  . Lack of Transportation (Medical): No  . Lack of Transportation (Non-Medical): No  Physical Activity: Inactive  . Days of Exercise  per Week: 0 days  . Minutes of Exercise per Session: 0 min  Stress: Stress Concern Present  . Feeling of Stress : To some extent  Social Connections: Socially Integrated  . Frequency of Communication with Friends and Family: More than three times a week  . Frequency of Social Gatherings with Friends and Family: Never  . Attends Religious Services: More than 4 times per year  . Active Member of Clubs or Organizations: Yes  . Attends Archivist Meetings: More than 4 times per year  . Marital Status: Married     Family History:  The patient's family history includes Heart attack (age of onset: 32) in her sister; Heart attack (age of onset: 65) in  her mother.   ROS:   Please see the history of present illness.    ROS All other systems reviewed and are negative.   PHYSICAL EXAM:   VS:  BP (!) 158/96   Pulse 67   Wt 227 lb (103 kg)   SpO2 99%   BMI 37.77 kg/m   Physical Exam  GEN: Obese, in no acute distress  Neck: no JVD, carotid bruits, or masses Cardiac:RRR; S4, no murmurs, rubs, Respiratory:  clear to auscultation bilaterally, normal work of breathing GI: soft, nontender, nondistended, + BS Ext: without cyanosis, clubbing, or edema, Good distal pulses bilaterally Neuro:  Alert and Oriented x 3,  Psych: euthymic mood, full affect  Wt Readings from Last 3 Encounters:  02/18/20 227 lb (103 kg)  02/14/20 224 lb 3.2 oz (101.7 kg)  02/03/20 227 lb (103 kg)      Studies/Labs Reviewed:   EKG:  EKG is not ordered today.  Recent Labs: 11/13/2019: ALT 36; Hemoglobin 11.3; Platelets 231 02/04/2020: BUN 10; Creat 0.83; Potassium 3.9; Sodium 140   Lipid Panel    Component Value Date/Time   CHOL 123 03/14/2016 0739   TRIG 66 11/13/2019 1355   HDL 46 03/14/2016 0739   CHOLHDL 2.7 03/14/2016 0739   VLDL 17 03/14/2016 0739   LDLCALC 60 03/14/2016 0739    Additional studies/ records that were reviewed today include:  2D echo 02/05/2020 IMPRESSIONS     1. Left  ventricular ejection fraction, by estimation, is 65 to 70%. The  left ventricle has normal function. The left ventricle has no regional  wall motion abnormalities. There is mild left ventricular hypertrophy.  Left ventricular diastolic parameters  are indeterminate.   2. Right ventricular systolic function is normal. The right ventricular  size is normal. Tricuspid regurgitation signal is inadequate for assessing  PA pressure.   3. The mitral valve is grossly normal. Mild mitral valve regurgitation.   4. The aortic valve is tricuspid. Aortic valve regurgitation is not  visualized.   5. The inferior vena cava is normal in size with greater than 50%  respiratory variability, suggesting right atrial pressure of 3 mmHg.   FINDINGS   Left Ventricle: Left ventricular ejection fraction, by estimation, is 65  to 70%. The left ventricle has normal function. The left ventricle has no  regional wall motion abnormalities. The left ventricular internal cavity  size was normal in size. There is   mild left ventricular hypertrophy. Left ventricular diastolic parameters  are indeterminate.   Right Ventricle: The right ventricular size is normal. No increase in  right ventricular wall thickness. Right ventricular systolic function is  normal. Tricuspid regurgitation signal is inadequate for assessing PA  pressure.   Left Atrium: Left atrial size was normal in size.   Right Atrium: Right atrial size was normal in size.   Pericardium: There is no evidence of pericardial effusion. Presence of  pericardial fat pad.   Mitral Valve: The mitral valve is grossly normal. Mild mitral valve  regurgitation.   Tricuspid Valve: The tricuspid valve is grossly normal. Tricuspid valve  regurgitation is trivial.   Aortic Valve: The aortic valve is tricuspid. There is mild aortic valve  annular calcification. Aortic valve regurgitation is not visualized.   Pulmonic Valve: The pulmonic valve was grossly  normal. Pulmonic valve  regurgitation is trivial.   Aorta: The aortic root is normal in size and structure.   Venous: The inferior vena cava is normal in size with greater than 50%  respiratory variability, suggesting right atrial pressure of 3 mmHg.   IAS/Shunts: No atrial level shunt detected by color flow Doppler.     08/2012 cath Hemodynamics: AO:  164/82   mmHg LV:  169/14    mmHg LVEDP: 24  mmHg   Coronary angiography: Coronary dominance: Right     Left Main:  Normal  Left Anterior Descending (LAD):  Normal in size with no significant disease.  1st diagonal (D1):  Small in size with minor irregularities.  2nd diagonal (D2):  Normal in size with no significant disease.  3rd diagonal (D3):  Normal in size with no significant disease.  Circumflex (LCx):  Normal in size and nondominant. The vessel has no significant disease.  1st obtuse marginal:  Small in size with no significant disease.  2nd obtuse marginal:  Small in size with no significant disease.  3rd obtuse marginal:  Large in size with no significant disease.    Right Coronary Artery: Normal in size and dominant. The vessel has no significant disease.  posterior descending artery: Normal in size with no significant disease.  posterior lateral branchs:  Normal in size with no significant disease.   Left ventriculography: Left ventricular systolic function is normal , LVEF is estimated at 60-65% %, there is no significant mitral regurgitation    Final Conclusions:    1. Normal coronary arteries. 2. Normal LV systolic function. 3. Moderately elevated left ventricular end-diastolic pressure likely due to diastolic heart failure.     06/2013 echo Study Conclusions  - Procedure narrative: Transthoracic echocardiography. Image   quality was suboptimal. The study was technically   difficult, as a result of poor sound wave transmission and   restricted patient mobility. - Left ventricle: The cavity size  was normal. Wall thickness   was increased in a pattern of mild to moderate LVH.   Diastolic dysfunction noted, grade indeterminant. Systolic   function was normal. The estimated ejection fraction was   in the range of 55% to 60%. Wall motion was normal; there   were no regional wall motion abnormalities.     Jan 2017 Event monitor: no arrhythmias   06/2014 CPX Conclusion: Exercise testing with gas exchange demonstrates a mild-moderately reduced functional capacity when compared to matched sedentary norms. There was an in-adequate HR response to the exercise response and flat O2 pulse. This high HR reserve was likely high due to pulmonary limitations based on restrictive spirometry and RR 45 at peak exercise and reaching maximum ventilatory limits. However, it could have easily been due to poor effort. The sub-maximal effort is clouding interpretation of the test. Nevertheless, correlation of resting spirometry test  Showing restriction with outpatient pulmonary function test is warranted     05/2016 heart monitor  Telemetry tracings predominately show sinus rhythm  Reported symptoms correlate with occasional PVCs and PACs       02/2016 echo Study Conclusions   - Left ventricle: The cavity size was normal. Systolic function was   vigorous. The estimated ejection fraction was in the range of 65%   to 70%. Wall motion was normal; there were no regional wall   motion abnormalities. There was an increased relative   contribution of atrial contraction to ventricular filling.   Doppler parameters are consistent with abnormal left ventricular   relaxation (grade 1 diastolic dysfunction). - Pulmonic valve: There was trivial regurgitation. - Pulmonary arteries: Systolic pressure could not be accurately   estimated.         ASSESSMENT:  1. Palpitations   2. Essential hypertension   3. Chronic diastolic CHF (congestive heart failure) (HCC)   4. Chest pain, unspecified  type      PLAN:  In order of problems listed above:  Palpitations on metoprolol and diltiazem-has palpitations   when she was aggravated. Can take extra diltiazem as needed   Hypertension I added losartan 25 mg daily but another MD called in 50 mg daily and she's taking both for a total of 75 mg daily . With elevated BP will increase to 100 mg daily and  f/u bmet in 2 weeks. Normal and echo normal LVEF 65-70% with mild LVH  Chronic diastolic CHF taking more lasix recently for leg swelling. Watching diet closer.  2D echo 02/05/2020 normal LVEF 65 to 70% with mild LVH and diastolic parameters were indeterminate   History of chest pain with normal cardiac catheter in 2014               Medication Adjustments/Labs and Tests Ordered: Current medicines are reviewed at length with the patient today.  Concerns regarding medicines are outlined above.  Medication changes, Labs and Tests ordered today are listed in the Patient Instructions below. Patient Instructions  Medication Instructions:  Your physician recommends that you continue on your current medications as directed. Please refer to the Current Medication list given to you today.  *If you need a refill on your cardiac medications before your next appointment, please call your pharmacy*   Lab Work: BMET at next Office Visit in 2 weeks  If you have labs (blood work) drawn today and your tests are completely normal, you will receive your results only by: Marland Kitchen MyChart Message (if you have MyChart) OR . A paper copy in the mail If you have any lab test that is abnormal or we need to change your treatment, we will call you to review the results.   Testing/Procedures: None   Follow-Up: At [facility], you and your health needs are our priority.  As part of our continuing mission to provide you with exceptional heart care, we have created designated Provider Care Teams.  These Care Teams include your primary Cardiologist  (physician) and Advanced Practice Providers (APPs -  Physician Assistants and Nurse Practitioners) who all work together to provide you with the care you need, when you need it.  We recommend signing up for the patient portal called "MyChart".  Sign up information is provided on this After Visit Summary.  MyChart is used to connect with patients for Virtual Visits (Telemedicine).  Patients are able to view lab/test results, encounter notes, upcoming appointments, etc.  Non-urgent messages can be sent to your provider as well.   To learn more about what you can do with MyChart, go to [email or web address].    Your next appointment:   2 week(s)  The format for your next appointment:   In Person  Provider:   Ermalinda Barrios, PA-C       Signed, Ermalinda Barrios, PA-C  02/18/2020 11:35 AM    [address], [place], [place]  [address] Phone: [phone]; Fax: [phone]

## 2020-02-13 ENCOUNTER — Encounter: Payer: Medicare Other | Admitting: Physical Medicine and Rehabilitation

## 2020-02-14 ENCOUNTER — Encounter
Payer: Medicare Other | Attending: Physical Medicine and Rehabilitation | Admitting: Physical Medicine and Rehabilitation

## 2020-02-14 ENCOUNTER — Encounter: Payer: Self-pay | Admitting: Physical Medicine and Rehabilitation

## 2020-02-14 ENCOUNTER — Other Ambulatory Visit: Payer: Self-pay

## 2020-02-14 VITALS — BP 144/96 | HR 73 | Temp 98.6°F | Ht 65.0 in | Wt 224.2 lb

## 2020-02-14 DIAGNOSIS — R5382 Chronic fatigue, unspecified: Secondary | ICD-10-CM | POA: Insufficient documentation

## 2020-02-14 DIAGNOSIS — M545 Low back pain, unspecified: Secondary | ICD-10-CM | POA: Diagnosis not present

## 2020-02-14 DIAGNOSIS — G8929 Other chronic pain: Secondary | ICD-10-CM

## 2020-02-14 DIAGNOSIS — I10 Essential (primary) hypertension: Secondary | ICD-10-CM | POA: Insufficient documentation

## 2020-02-14 MED ORDER — LIDOCAINE 5 % EX PTCH: 2.0000 | MEDICATED_PATCH | CUTANEOUS | 1 refills | Status: DC

## 2020-02-14 MED ORDER — LOSARTAN POTASSIUM 50 MG PO TABS: 50.0000 mg | ORAL_TABLET | Freq: Every day | ORAL | 1 refills | Status: DC

## 2020-02-14 MED ORDER — DICLOFENAC SODIUM 1 % EX GEL: 2.0000 g | Freq: Four times a day (QID) | CUTANEOUS | 3 refills | Status: DC

## 2020-02-14 NOTE — Progress Notes (Signed)
Subjective:    Patient ID: Hannah Cooper, female    DOB: 23-Feb-1956, 64 y.o.   MRN: [id number]  HPI  Hannah Cooper is a 64 year old woman who presents to establish care for lower back pain where she has a spinal tumor. She prefers pain medication to surgery. When she walks down the stairs it hurts. When she walked to the school with her daughter it was awful. She was given a medication which helps to relieve the pain. She has follow-up with oncology in 3 months. Average pain is 10/10. She tries to bear the pain has much as possible because she has children- they are 14 and 6, 12 and 13. She has adopted the three when they were infants.   The lidocaine patches helped a lot with her low back pain. The gel has been helping as well.  She has also been taking a pain medication with an H- she cannot recall exactly the name. I have reviewed her medication list and   She shows me a picture of one of her 40 year old daughters whom she adopted.   She says she eats a normal diet.  Exercise limited due to her pain.   She is accompanied by her husband today.  Pain medications reviewed with her and she tries to use the ibuprofen and Tylenol sparingly (latter does not help much).   Pain Inventory Average Pain 8 Pain Right Now 8 My pain is constant, sharp, burning and aching  In the last 24 hours, has pain interfered with the following? General activity 7 Relation with others 8 Enjoyment of life 8 What TIME of day is your pain at its worst? varies Sleep (in general) Fair  Pain is worse with: walking and bending Pain improves with: rest and medication Relief from Meds: 7     Family History  Problem Relation Age of Onset  . Heart attack Mother 2  . Heart attack Sister 59   Social History   Socioeconomic History  . Marital status: Married    Spouse name: Not on file  . Number of children: Not on file  . Years of education: Not on file  . Highest education level: Not on file    Occupational History  . Not on file  Tobacco Use  . Smoking status: Never Smoker  . Smokeless tobacco: Never Used  Vaping Use  . Vaping Use: Never used  Substance and Sexual Activity  . Alcohol use: No    Alcohol/week: 0.0 standard drinks  . Drug use: No  . Sexual activity: Yes    Partners: Male  Other Topics Concern  . Not on file  Social History Narrative  . Not on file   Social Determinants of Health   Financial Resource Strain: Low Risk   . Difficulty of Paying Living Expenses: Not hard at all  Food Insecurity: No Food Insecurity  . Worried About Charity fundraiser in the Last Year: Never true  . Ran Out of Food in the Last Year: Never true  Transportation Needs: No Transportation Needs  . Lack of Transportation (Medical): No  . Lack of Transportation (Non-Medical): No  Physical Activity: Inactive  . Days of Exercise per Week: 0 days  . Minutes of Exercise per Session: 0 min  Stress: Stress Concern Present  . Feeling of Stress : To some extent  Social Connections: Socially Integrated  . Frequency of Communication with Friends and Family: More than three times a week  .  Frequency of Social Gatherings with Friends and Family: Never  . Attends Religious Services: More than 4 times per year  . Active Member of Clubs or Organizations: Yes  . Attends Archivist Meetings: More than 4 times per year  . Marital Status: Married   Past Surgical History:  Procedure Laterality Date  . ABDOMINAL HYSTERECTOMY    . IR FLUORO GUIDED NEEDLE PLC ASPIRATION/INJECTION LOC  06/19/2019  . LEFT HEART CATHETERIZATION WITH CORONARY ANGIOGRAM N/A 09/05/2012   Procedure: LEFT HEART CATHETERIZATION WITH CORONARY ANGIOGRAM;  Surgeon: Wellington Hampshire, MD;  Location: [facility];  Service: Cardiovascular;  Laterality: N/A;  . THYROID SURGERY  2002   Past Medical History:  Diagnosis Date  . Arthritis   . Atypical chest pain    chronic  . Cancer ([place])    bone cancer  . Chronic  back pain   . Chronic diastolic CHF (congestive heart failure) ([place])   . COPD (chronic obstructive pulmonary disease) ([place])   . DDD (degenerative disc disease), cervical   . Hyperlipidemia   . Hypertension   . Lumbar radiculopathy   . Meningitis   . Normal coronary arteries 2014  . Palpitations   . Premature atrial contractions   . PVC's (premature ventricular contractions)    There were no vitals taken for this visit.  Opioid Risk Score:   Fall Risk Score:  `1  Depression screen PHQ 2/9  Depression screen [facility] 2/9 01/09/2020 03/31/2015  Decreased Interest 1 0  Down, Depressed, Hopeless 0 1  PHQ - 2 Score 1 1  Altered sleeping 0 0  Tired, decreased energy 1 2  Change in appetite 0 1  Feeling bad or failure about yourself  0 1  Trouble concentrating 0 0  Moving slowly or fidgety/restless 0 0  Suicidal thoughts 0 0  PHQ-9 Score 2 5  Difficult doing work/chores Not difficult at all Somewhat difficult  Some recent data might be hidden    Review of Systems  Musculoskeletal: Positive for back pain and gait problem.       Left leg pain  Neurological: Positive for numbness.  All other systems reviewed and are negative.      Objective:   Physical Exam  Gen: no distress, normal appearing HEENT: oral mucosa pink and moist, NCAT Cardio: Reg rate Chest: normal effort, normal rate of breathing Abd: soft, non-distended Ext: no edema Skin: Dry skin over radiation site on spine Neuro: Alert and oriented x3 Musculoskeletal: left leg with pronounced weakness: 4/5 HF, 4-/5 KE, 3/5 DF and PF Psych: pleasant, normal affect    Assessment & Plan:  Hannah Cooper is a 64 year old woman who presents to establish care for pain secondary to spinal tumor.  1) Chronic Pain Syndrome secondary to spinal tumor (solitary plasmacytoma- Dr. Tomie China note reviewed).  -Discussed current symptoms of pain and history of pain.  -Discussed following foods that may reduce pain: 1) Ginger 2)  Blueberries 3) Salmon 4) Pumpkin seeds 5) dark chocolate 6) turmeric 7) tart cherries 8) virgin olive oil 9) chilli peppers 10) mint 11) red wine -Made goal to incorporate these foods into her diet.  -Discussed vitamin deficiencies associated with pain: Vitamin D- lab order and discussed.  -Continue ice to lower back BID.  -Increase gabapentin TID -Lidocaine patch has been helping- increase to 2 patches- one on each side.  -Use Ibuprofen during the day if she has pain (not sedating) -Minimize narcotic use at this time due to risks of  tolerance- can maximize more conservative agents first. Advised that this can be addicting. -Refilled lidocaine patch and diclofenac gel.   2) Cancer related fatigue: Iron level discussed and treatment options discussed -Discussed dietary options to help improve injury.   3) Dry skin at radiation site: Apply coconut oil. This will also help with itching. If that does not improve, then asked her to call me regarding a script for hydrocortisone.   4) HTN: Prescribed increased dose of Losartan 50mg . Check and log BP at home and bring to next appointment.   All questions answered. RTC in 4 weeks

## 2020-02-18 ENCOUNTER — Other Ambulatory Visit: Payer: Self-pay

## 2020-02-18 ENCOUNTER — Ambulatory Visit (INDEPENDENT_AMBULATORY_CARE_PROVIDER_SITE_OTHER): Payer: No Typology Code available for payment source | Admitting: Physician Assistant

## 2020-02-18 ENCOUNTER — Encounter: Payer: Self-pay | Admitting: Physician Assistant

## 2020-02-18 VITALS — BP 158/96 | HR 67 | Wt 227.0 lb

## 2020-02-18 DIAGNOSIS — R079 Chest pain, unspecified: Secondary | ICD-10-CM | POA: Diagnosis not present

## 2020-02-18 DIAGNOSIS — I10 Essential (primary) hypertension: Secondary | ICD-10-CM

## 2020-02-18 DIAGNOSIS — I5032 Chronic diastolic (congestive) heart failure: Secondary | ICD-10-CM | POA: Diagnosis not present

## 2020-02-18 DIAGNOSIS — R002 Palpitations: Secondary | ICD-10-CM | POA: Diagnosis not present

## 2020-02-18 MED ORDER — LOSARTAN POTASSIUM 100 MG PO TABS: 100.0000 mg | ORAL_TABLET | Freq: Every day | ORAL | 3 refills | Status: DC

## 2020-02-18 NOTE — Patient Instructions (Signed)
Medication Instructions:  Your physician recommends that you continue on your current medications as directed. Please refer to the Current Medication list given to you today.  *If you need a refill on your cardiac medications before your next appointment, please call your pharmacy*   Lab Work: BMET at next Office Visit in 2 weeks  If you have labs (blood work) drawn today and your tests are completely normal, you will receive your results only by: Marland Kitchen MyChart Message (if you have MyChart) OR . A paper copy in the mail If you have any lab test that is abnormal or we need to change your treatment, we will call you to review the results.   Testing/Procedures: None   Follow-Up: At [facility], you and your health needs are our priority.  As part of our continuing mission to provide you with exceptional heart care, we have created designated Provider Care Teams.  These Care Teams include your primary Cardiologist (physician) and Advanced Practice Providers (APPs -  Physician Assistants and Nurse Practitioners) who all work together to provide you with the care you need, when you need it.  We recommend signing up for the patient portal called "MyChart".  Sign up information is provided on this After Visit Summary.  MyChart is used to connect with patients for Virtual Visits (Telemedicine).  Patients are able to view lab/test results, encounter notes, upcoming appointments, etc.  Non-urgent messages can be sent to your provider as well.   To learn more about what you can do with MyChart, go to [email or web address].    Your next appointment:   2 week(s)  The format for your next appointment:   In Person  Provider:   Ermalinda Barrios, PA-C

## 2020-02-18 NOTE — Addendum Note (Signed)
Addended by: Carylon Perches on: 02/18/2020 11:40 AM   Modules accepted: Orders

## 2020-02-24 ENCOUNTER — Other Ambulatory Visit (HOSPITAL_COMMUNITY): Payer: Self-pay | Admitting: *Deleted

## 2020-02-24 MED ORDER — HYDROCODONE-ACETAMINOPHEN 10-325 MG PO TABS: 1.0000 | ORAL_TABLET | Freq: Two times a day (BID) | ORAL | 0 refills | Status: DC | PRN

## 2020-02-24 NOTE — Progress Notes (Deleted)
Cardiology Office Note    Date:  02/24/2020   ID:  Hannah Cooper, DOB 07/10/1955, MRN [id number]  PCP:  Jake Samples, PA-C  Cardiologist: Carlyle Dolly, MD EPS: None  No chief complaint on file.   History of Present Illness:  Hannah Cooper is a 64 y.o. female with history of HTN, palpitations-monitor 2018 PAC and PVC's on lopressor and diltiazem, history of atypical chest pain normal cardiac cath 2014, COPD, chronic diastolic CHF Echo 3235 LVEF 55 to 60% with abnormal diastolic function, dyspnea CPX 06/2014 mild to moderate reduced functional capacity   Patient was diagnosed with a spinal tumor solitary plasmacytoma and undergoing radiation.  Covid + 10/2019 after being vaccinated.    I saw the patient 02/03/2020 at which time her blood pressure was running high, legs were swelling and she  was taking Lasix more often.  Heart was also racing when she became aggravated. Echo 02/05/20 normal LVEF 65-70% mild LVH   I saw the patient 02/18/2020 and she was inadvertently taking 75 mg of losartan but blood pressure was still high.  I increased it to 100 mg daily.    Past Medical History:  Diagnosis Date  . Arthritis   . Atypical chest pain    chronic  . Cancer ([place])    bone cancer  . Chronic back pain   . Chronic diastolic CHF (congestive heart failure) ([place])   . COPD (chronic obstructive pulmonary disease) ([place])   . DDD (degenerative disc disease), cervical   . Hyperlipidemia   . Hypertension   . Lumbar radiculopathy   . Meningitis   . Normal coronary arteries 2014  . Palpitations   . Premature atrial contractions   . PVC's (premature ventricular contractions)     Past Surgical History:  Procedure Laterality Date  . ABDOMINAL HYSTERECTOMY    . IR FLUORO GUIDED NEEDLE PLC ASPIRATION/INJECTION LOC  06/19/2019  . LEFT HEART CATHETERIZATION WITH CORONARY ANGIOGRAM N/A 09/05/2012   Procedure: LEFT HEART CATHETERIZATION WITH CORONARY ANGIOGRAM;  Surgeon: Wellington Hampshire, MD;  Location: [facility];  Service: Cardiovascular;  Laterality: N/A;  . THYROID SURGERY  2002    Current Medications: No outpatient medications have been marked as taking for the 03/09/20 encounter (Appointment) with Imogene Burn, PA-C.     Allergies:   Other   Social History   Socioeconomic History  . Marital status: Married    Spouse name: Not on file  . Number of children: Not on file  . Years of education: Not on file  . Highest education level: Not on file  Occupational History  . Not on file  Tobacco Use  . Smoking status: Never Smoker  . Smokeless tobacco: Never Used  Vaping Use  . Vaping Use: Never used  Substance and Sexual Activity  . Alcohol use: No    Alcohol/week: 0.0 standard drinks  . Drug use: No  . Sexual activity: Yes    Partners: Male  Other Topics Concern  . Not on file  Social History Narrative  . Not on file   Social Determinants of Health   Financial Resource Strain: Low Risk   . Difficulty of Paying Living Expenses: Not hard at all  Food Insecurity: No Food Insecurity  . Worried About Charity fundraiser in the Last Year: Never true  . Ran Out of Food in the Last Year: Never true  Transportation Needs: No Transportation Needs  . Lack of Transportation (Medical): No  .  Lack of Transportation (Non-Medical): No  Physical Activity: Inactive  . Days of Exercise per Week: 0 days  . Minutes of Exercise per Session: 0 min  Stress: Stress Concern Present  . Feeling of Stress : To some extent  Social Connections: Socially Integrated  . Frequency of Communication with Friends and Family: More than three times a week  . Frequency of Social Gatherings with Friends and Family: Never  . Attends Religious Services: More than 4 times per year  . Active Member of Clubs or Organizations: Yes  . Attends Archivist Meetings: More than 4 times per year  . Marital Status: Married     Family History:  The patient's ***family history  includes Heart attack (age of onset: 47) in her sister; Heart attack (age of onset: 77) in her mother.   ROS:   Please see the history of present illness.    ROS All other systems reviewed and are negative.   PHYSICAL EXAM:   VS:  There were no vitals taken for this visit.  Physical Exam  GEN: Well nourished, well developed, in no acute distress  HEENT: normal  Neck: no JVD, carotid bruits, or masses Cardiac:RRR; no murmurs, rubs, or gallops  Respiratory:  clear to auscultation bilaterally, normal work of breathing GI: soft, nontender, nondistended, + BS Ext: without cyanosis, clubbing, or edema, Good distal pulses bilaterally MS: no deformity or atrophy  Skin: warm and dry, no rash Neuro:  Alert and Oriented x 3, Strength and sensation are intact Psych: euthymic mood, full affect  Wt Readings from Last 3 Encounters:  02/18/20 227 lb (103 kg)  02/14/20 224 lb 3.2 oz (101.7 kg)  02/03/20 227 lb (103 kg)      Studies/Labs Reviewed:   EKG:  EKG is*** ordered today.  The ekg ordered today demonstrates ***  Recent Labs: 11/13/2019: ALT 36; Hemoglobin 11.3; Platelets 231 02/04/2020: BUN 10; Creat 0.83; Potassium 3.9; Sodium 140   Lipid Panel    Component Value Date/Time   CHOL 123 03/14/2016 0739   TRIG 66 11/13/2019 1355   HDL 46 03/14/2016 0739   CHOLHDL 2.7 03/14/2016 0739   VLDL 17 03/14/2016 0739   LDLCALC 60 03/14/2016 0739    Additional studies/ records that were reviewed today include:  2D echo 02/05/2020 IMPRESSIONS     1. Left ventricular ejection fraction, by estimation, is 65 to 70%. The  left ventricle has normal function. The left ventricle has no regional  wall motion abnormalities. There is mild left ventricular hypertrophy.  Left ventricular diastolic parameters  are indeterminate.   2. Right ventricular systolic function is normal. The right ventricular  size is normal. Tricuspid regurgitation signal is inadequate for assessing  PA pressure.    3. The mitral valve is grossly normal. Mild mitral valve regurgitation.   4. The aortic valve is tricuspid. Aortic valve regurgitation is not  visualized.   5. The inferior vena cava is normal in size with greater than 50%  respiratory variability, suggesting right atrial pressure of 3 mmHg.   FINDINGS   Left Ventricle: Left ventricular ejection fraction, by estimation, is 65  to 70%. The left ventricle has normal function. The left ventricle has no  regional wall motion abnormalities. The left ventricular internal cavity  size was normal in size. There is   mild left ventricular hypertrophy. Left ventricular diastolic parameters  are indeterminate.   Right Ventricle: The right ventricular size is normal. No increase in  right ventricular wall thickness.  Right ventricular systolic function is  normal. Tricuspid regurgitation signal is inadequate for assessing PA  pressure.   Left Atrium: Left atrial size was normal in size.   Right Atrium: Right atrial size was normal in size.   Pericardium: There is no evidence of pericardial effusion. Presence of  pericardial fat pad.   Mitral Valve: The mitral valve is grossly normal. Mild mitral valve  regurgitation.   Tricuspid Valve: The tricuspid valve is grossly normal. Tricuspid valve  regurgitation is trivial.   Aortic Valve: The aortic valve is tricuspid. There is mild aortic valve  annular calcification. Aortic valve regurgitation is not visualized.   Pulmonic Valve: The pulmonic valve was grossly normal. Pulmonic valve  regurgitation is trivial.   Aorta: The aortic root is normal in size and structure.   Venous: The inferior vena cava is normal in size with greater than 50%  respiratory variability, suggesting right atrial pressure of 3 mmHg.   IAS/Shunts: No atrial level shunt detected by color flow Doppler.        08/2012 cath Hemodynamics: AO:  164/82   mmHg LV:  169/14    mmHg LVEDP: 24  mmHg   Coronary  angiography: Coronary dominance: Right     Left Main:  Normal  Left Anterior Descending (LAD):  Normal in size with no significant disease.  1st diagonal (D1):  Small in size with minor irregularities.  2nd diagonal (D2):  Normal in size with no significant disease.  3rd diagonal (D3):  Normal in size with no significant disease.  Circumflex (LCx):  Normal in size and nondominant. The vessel has no significant disease.  1st obtuse marginal:  Small in size with no significant disease.  2nd obtuse marginal:  Small in size with no significant disease.  3rd obtuse marginal:  Large in size with no significant disease.    Right Coronary Artery: Normal in size and dominant. The vessel has no significant disease.  posterior descending artery: Normal in size with no significant disease.  posterior lateral branchs:  Normal in size with no significant disease.   Left ventriculography: Left ventricular systolic function is normal , LVEF is estimated at 60-65% %, there is no significant mitral regurgitation    Final Conclusions:    1. Normal coronary arteries. 2. Normal LV systolic function. 3. Moderately elevated left ventricular end-diastolic pressure likely due to diastolic heart failure.     06/2013 echo Study Conclusions  - Procedure narrative: Transthoracic echocardiography. Image   quality was suboptimal. The study was technically   difficult, as a result of poor sound wave transmission and   restricted patient mobility. - Left ventricle: The cavity size was normal. Wall thickness   was increased in a pattern of mild to moderate LVH.   Diastolic dysfunction noted, grade indeterminant. Systolic   function was normal. The estimated ejection fraction was   in the range of 55% to 60%. Wall motion was normal; there   were no regional wall motion abnormalities.     Jan 2017 Event monitor: no arrhythmias   06/2014 CPX Conclusion: Exercise testing with gas exchange demonstrates  a mild-moderately reduced functional capacity when compared to matched sedentary norms. There was an in-adequate HR response to the exercise response and flat O2 pulse. This high HR reserve was likely high due to pulmonary limitations based on restrictive spirometry and RR 45 at peak exercise and reaching maximum ventilatory limits. However, it could have easily been due to poor effort. The sub-maximal effort is clouding  interpretation of the test. Nevertheless, correlation of resting spirometry test  Showing restriction with outpatient pulmonary function test is warranted     05/2016 heart monitor  Telemetry tracings predominately show sinus rhythm  Reported symptoms correlate with occasional PVCs and PACs       02/2016 echo Study Conclusions   - Left ventricle: The cavity size was normal. Systolic function was   vigorous. The estimated ejection fraction was in the range of 65%   to 70%. Wall motion was normal; there were no regional wall   motion abnormalities. There was an increased relative   contribution of atrial contraction to ventricular filling.   Doppler parameters are consistent with abnormal left ventricular   relaxation (grade 1 diastolic dysfunction). - Pulmonic valve: There was trivial regurgitation. - Pulmonary arteries: Systolic pressure could not be accurately   estimated.             ASSESSMENT:    1. Essential hypertension   2. Palpitations   3. Chronic diastolic CHF (congestive heart failure) ([place])   4. History of chest pain      PLAN:  In order of problems listed above:  Hypertension losartan increased to 100 mg daily,  Palpitations on metoprolol and diltiazem  Chronic diastolic CHF was taking extra Lasix.  2D echo 02/05/2020 normal LVEF 65 to 70% with mild LVH  History of chest pain normal cardiac cath in 2014    Medication Adjustments/Labs and Tests Ordered: Current medicines are reviewed at length with the patient today.   Concerns regarding medicines are outlined above.  Medication changes, Labs and Tests ordered today are listed in the Patient Instructions below. There are no Patient Instructions on file for this visit.   Sumner Boast, PA-C  02/24/2020 2:46 PM    [facility] [address], [place], [place]  [address] Phone: [phone]; Fax: [phone]

## 2020-03-09 ENCOUNTER — Other Ambulatory Visit (HOSPITAL_COMMUNITY): Payer: No Typology Code available for payment source

## 2020-03-09 ENCOUNTER — Inpatient Hospital Stay (HOSPITAL_COMMUNITY): Payer: No Typology Code available for payment source | Attending: Hematology

## 2020-03-09 ENCOUNTER — Ambulatory Visit: Payer: PRIVATE HEALTH INSURANCE | Admitting: Physician Assistant

## 2020-03-09 DIAGNOSIS — R002 Palpitations: Secondary | ICD-10-CM

## 2020-03-09 DIAGNOSIS — Z87898 Personal history of other specified conditions: Secondary | ICD-10-CM

## 2020-03-09 DIAGNOSIS — I5032 Chronic diastolic (congestive) heart failure: Secondary | ICD-10-CM

## 2020-03-09 DIAGNOSIS — I10 Essential (primary) hypertension: Secondary | ICD-10-CM

## 2020-03-10 NOTE — Progress Notes (Deleted)
Cardiology Office Note    Date:  03/10/2020   ID:  DONI BACHA, DOB [birth year], MRN [id number]  PCP:  Jake Samples, PA-C  Cardiologist: Carlyle Dolly, MD EPS: None  No chief complaint on file.   History of Present Illness:  Hannah Cooper is a 64 y.o. 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  Covid + 10/2019 after being vaccinated.    I saw the patient 02/03/2020 at which time her blood pressure was running high, legs were swelling and she  was taking Lasix more often.  Heart was also racing when she became aggravated. Echo 02/05/20 normal LVEF 65-70% mild LVH   I last saw the patient 02/18/2020 at which time she was under a lot of stress.  Swelling was a little better.  She was taking 2 different losartan pills because she had a prescription from another MD called in 50 mg and I had given her 25 mg.  She was taking a total of 75 mg.  I increased her to 100 mg with repeat be met.  Also told her she could take extra diltiazem as needed for palpitations.   Past Medical History:  Diagnosis Date  . Arthritis   . Atypical chest pain    chronic  . Cancer ([place])    bone cancer  . Chronic back pain   . Chronic diastolic CHF (congestive heart failure) ([place])   . COPD (chronic obstructive pulmonary disease) ([place])   . DDD (degenerative disc disease), cervical   . Hyperlipidemia   . Hypertension   . Lumbar radiculopathy   . Meningitis   . Normal coronary arteries 2014  . Palpitations   . Premature atrial contractions   . PVC's (premature ventricular contractions)     Past Surgical History:  Procedure Laterality Date   . ABDOMINAL HYSTERECTOMY    . IR FLUORO GUIDED NEEDLE PLC ASPIRATION/INJECTION LOC  06/19/2019  . LEFT HEART CATHETERIZATION WITH CORONARY ANGIOGRAM N/A 09/05/2012   Procedure: LEFT HEART CATHETERIZATION WITH CORONARY ANGIOGRAM;  Surgeon: Wellington Hampshire, MD;  Location: [facility];  Service: Cardiovascular;  Laterality: N/A;  . THYROID SURGERY  2002    Current Medications: No outpatient medications have been marked as taking for the 03/17/20 encounter (Appointment) with Imogene Burn, PA-C.     Allergies:   Other   Social History   Socioeconomic History  . Marital status: Married    Spouse name: Not on file  . Number of children: Not on file  . Years of education: Not on file  . Highest education level: Not on file  Occupational History  . Not on file  Tobacco Use  . Smoking status: Never Smoker  . Smokeless tobacco: Never Used  Vaping Use  . Vaping Use: Never used  Substance and Sexual Activity  . Alcohol use: No    Alcohol/week: 0.0 standard drinks  . Drug use: No  . Sexual activity: Yes    Partners: Male  Other Topics Concern  . Not on file  Social History Narrative  . Not on file   Social Determinants of Health   Financial Resource Strain: Low Risk   . Difficulty of  Paying Living Expenses: Not hard at all  Food Insecurity: No Food Insecurity  . Worried About Charity fundraiser in the Last Year: Never true  . Ran Out of Food in the Last Year: Never true  Transportation Needs: No Transportation Needs  . Lack of Transportation (Medical): No  . Lack of Transportation (Non-Medical): No  Physical Activity: Inactive  . Days of Exercise per Week: 0 days  . Minutes of Exercise per Session: 0 min  Stress: Stress Concern Present  . Feeling of Stress : To some extent  Social Connections: Socially Integrated  . Frequency of Communication with Friends and Family: More than three times a week  . Frequency of Social Gatherings with Friends and Family: Never  .  Attends Religious Services: More than 4 times per year  . Active Member of Clubs or Organizations: Yes  . Attends Archivist Meetings: More than 4 times per year  . Marital Status: Married     Family History:  The patient's ***family history includes Heart attack (age of onset: 64) in her sister; Heart attack (age of onset: 42) in her mother.   ROS:   Please see the history of present illness.    ROS All other systems reviewed and are negative.   PHYSICAL EXAM:   VS:  There were no vitals taken for this visit.  Physical Exam  GEN: Well nourished, well developed, in no acute distress  HEENT: normal  Neck: no JVD, carotid bruits, or masses Cardiac:RRR; no murmurs, rubs, or gallops  Respiratory:  clear to auscultation bilaterally, normal work of breathing GI: soft, nontender, nondistended, + BS Ext: without cyanosis, clubbing, or edema, Good distal pulses bilaterally MS: no deformity or atrophy  Skin: warm and dry, no rash Neuro:  Alert and Oriented x 3, Strength and sensation are intact Psych: euthymic mood, full affect  Wt Readings from Last 3 Encounters:  02/18/20 227 lb (103 kg)  02/14/20 224 lb 3.2 oz (101.7 kg)  02/03/20 227 lb (103 kg)      Studies/Labs Reviewed:   EKG:  EKG is*** ordered today.  The ekg ordered today demonstrates ***  Recent Labs: 11/13/2019: ALT 36; Hemoglobin 11.3; Platelets 231 02/04/2020: BUN 10; Creat 0.83; Potassium 3.9; Sodium 140   Lipid Panel    Component Value Date/Time   CHOL 123 03/14/2016 0739   TRIG 66 11/13/2019 1355   HDL 46 03/14/2016 0739   CHOLHDL 2.7 03/14/2016 0739   VLDL 17 03/14/2016 0739   LDLCALC 60 03/14/2016 0739    Additional studies/ records that were reviewed today include:  2D echo 02/05/2020 IMPRESSIONS     1. Left ventricular ejection fraction, by estimation, is 65 to 70%. The  left ventricle has normal function. The left ventricle has no regional  wall motion abnormalities. There is mild left  ventricular hypertrophy.  Left ventricular diastolic parameters  are indeterminate.   2. Right ventricular systolic function is normal. The right ventricular  size is normal. Tricuspid regurgitation signal is inadequate for assessing  PA pressure.   3. The mitral valve is grossly normal. Mild mitral valve regurgitation.   4. The aortic valve is tricuspid. Aortic valve regurgitation is not  visualized.   5. The inferior vena cava is normal in size with greater than 50%  respiratory variability, suggesting right atrial pressure of 3 mmHg.   FINDINGS   Left Ventricle: Left ventricular ejection fraction, by estimation, is 65  to 70%. The left ventricle has normal function.  The left ventricle has no  regional wall motion abnormalities. The left ventricular internal cavity  size was normal in size. There is   mild left ventricular hypertrophy. Left ventricular diastolic parameters  are indeterminate.   Right Ventricle: The right ventricular size is normal. No increase in  right ventricular wall thickness. Right ventricular systolic function is  normal. Tricuspid regurgitation signal is inadequate for assessing PA  pressure.   Left Atrium: Left atrial size was normal in size.   Right Atrium: Right atrial size was normal in size.   Pericardium: There is no evidence of pericardial effusion. Presence of  pericardial fat pad.   Mitral Valve: The mitral valve is grossly normal. Mild mitral valve  regurgitation.   Tricuspid Valve: The tricuspid valve is grossly normal. Tricuspid valve  regurgitation is trivial.   Aortic Valve: The aortic valve is tricuspid. There is mild aortic valve  annular calcification. Aortic valve regurgitation is not visualized.   Pulmonic Valve: The pulmonic valve was grossly normal. Pulmonic valve  regurgitation is trivial.   Aorta: The aortic root is normal in size and structure.   Venous: The inferior vena cava is normal in size with greater than 50%   respiratory variability, suggesting right atrial pressure of 3 mmHg.   IAS/Shunts: No atrial level shunt detected by color flow Doppler.        08/2012 cath Hemodynamics: AO:  164/82   mmHg LV:  169/14    mmHg LVEDP: 24  mmHg   Coronary angiography: Coronary dominance: Right     Left Main:  Normal  Left Anterior Descending (LAD):  Normal in size with no significant disease.  1st diagonal (D1):  Small in size with minor irregularities.  2nd diagonal (D2):  Normal in size with no significant disease.  3rd diagonal (D3):  Normal in size with no significant disease.  Circumflex (LCx):  Normal in size and nondominant. The vessel has no significant disease.  1st obtuse marginal:  Small in size with no significant disease.  2nd obtuse marginal:  Small in size with no significant disease.  3rd obtuse marginal:  Large in size with no significant disease.    Right Coronary Artery: Normal in size and dominant. The vessel has no significant disease.  posterior descending artery: Normal in size with no significant disease.  posterior lateral branchs:  Normal in size with no significant disease.   Left ventriculography: Left ventricular systolic function is normal , LVEF is estimated at 60-65% %, there is no significant mitral regurgitation    Final Conclusions:    1. Normal coronary arteries. 2. Normal LV systolic function. 3. Moderately elevated left ventricular end-diastolic pressure likely due to diastolic heart failure.     06/2013 echo Study Conclusions  - Procedure narrative: Transthoracic echocardiography. Image   quality was suboptimal. The study was technically   difficult, as a result of poor sound wave transmission and   restricted patient mobility. - Left ventricle: The cavity size was normal. Wall thickness   was increased in a pattern of mild to moderate LVH.   Diastolic dysfunction noted, grade indeterminant. Systolic   function was normal. The estimated  ejection fraction was   in the range of 55% to 60%. Wall motion was normal; there   were no regional wall motion abnormalities.     Jan 2017 Event monitor: no arrhythmias   06/2014 CPX Conclusion: Exercise testing with gas exchange demonstrates a mild-moderately reduced functional capacity when compared to matched sedentary norms. There  was an in-adequate HR response to the exercise response and flat O2 pulse. This high HR reserve was likely high due to pulmonary limitations based on restrictive spirometry and RR 45 at peak exercise and reaching maximum ventilatory limits. However, it could have easily been due to poor effort. The sub-maximal effort is clouding interpretation of the test. Nevertheless, correlation of resting spirometry test  Showing restriction with outpatient pulmonary function test is warranted     05/2016 heart monitor  Telemetry tracings predominately show sinus rhythm  Reported symptoms correlate with occasional PVCs and PACs       02/2016 echo Study Conclusions   - Left ventricle: The cavity size was normal. Systolic function was   vigorous. The estimated ejection fraction was in the range of 65%   to 70%. Wall motion was normal; there were no regional wall   motion abnormalities. There was an increased relative   contribution of atrial contraction to ventricular filling.   Doppler parameters are consistent with abnormal left ventricular   relaxation (grade 1 diastolic dysfunction). - Pulmonic valve: There was trivial regurgitation. - Pulmonary arteries: Systolic pressure could not be accurately   estimated.           ASSESSMENT:    No diagnosis found.   PLAN:  In order of problems listed above:   Palpitations on metoprolol and diltiazem-has palpitations   when she was aggravated. Can take extra diltiazem as needed   Hypertension I increased losartan 100 mg daily  echo normal LVEF 65-70% with mild LVH   Chronic diastolic CHF taking  more lasix recently for leg swelling. Watching diet closer.  2D echo 02/05/2020 normal LVEF 65 to 70% with mild LVH and diastolic parameters were indeterminate   History of chest pain with normal cardiac catheter in 2014           Medication Adjustments/Labs and Tests Ordered: Current medicines are reviewed at length with the patient today.  Concerns regarding medicines are outlined above.  Medication changes, Labs and Tests ordered today are listed in the Patient Instructions below. There are no Patient Instructions on file for this visit.   Sumner Boast, PA-C  03/10/2020 12:02 PM    [facility] [address], [place], [place]  [address] Phone: [phone]; Fax: [phone]

## 2020-03-16 ENCOUNTER — Ambulatory Visit (HOSPITAL_COMMUNITY): Payer: No Typology Code available for payment source | Admitting: Hematology

## 2020-03-17 ENCOUNTER — Ambulatory Visit: Payer: PRIVATE HEALTH INSURANCE | Admitting: Physician Assistant

## 2020-03-19 ENCOUNTER — Encounter: Payer: Self-pay | Admitting: Physical Medicine and Rehabilitation

## 2020-03-19 ENCOUNTER — Encounter
Payer: No Typology Code available for payment source | Attending: Physical Medicine and Rehabilitation | Admitting: Physical Medicine and Rehabilitation

## 2020-03-19 ENCOUNTER — Other Ambulatory Visit: Payer: Self-pay

## 2020-03-19 VITALS — BP 149/97 | HR 82 | Temp 98.1°F | Ht 65.0 in | Wt 221.0 lb

## 2020-03-19 DIAGNOSIS — G8929 Other chronic pain: Secondary | ICD-10-CM | POA: Diagnosis not present

## 2020-03-19 DIAGNOSIS — M545 Low back pain, unspecified: Secondary | ICD-10-CM | POA: Diagnosis not present

## 2020-03-19 DIAGNOSIS — I10 Essential (primary) hypertension: Secondary | ICD-10-CM | POA: Diagnosis not present

## 2020-03-19 LAB — BASIC METABOLIC PANEL
BUN: 15 mg/dL (ref 7–25)
CO2: 32 mmol/L (ref 20–32)
Calcium: 10 mg/dL (ref 8.6–10.4)
Chloride: 101 mmol/L (ref 98–110)
Creat: 0.86 mg/dL (ref 0.50–0.99)
Glucose, Bld: 126 mg/dL (ref 65–139)
Potassium: 3.5 mmol/L (ref 3.5–5.3)
Sodium: 141 mmol/L (ref 135–146)

## 2020-03-19 MED ORDER — DICLOFENAC SODIUM 1 % EX GEL
2.0000 g | Freq: Four times a day (QID) | CUTANEOUS | 3 refills | Status: DC
Start: 2020-03-19 — End: 2020-07-03

## 2020-03-19 MED ORDER — LIDOCAINE 5 % EX PTCH: 2.0000 | MEDICATED_PATCH | CUTANEOUS | 1 refills | Status: DC

## 2020-03-19 MED ORDER — LOSARTAN POTASSIUM 50 MG PO TABS: 50.0000 mg | ORAL_TABLET | Freq: Every day | ORAL | 3 refills | Status: DC

## 2020-03-19 NOTE — Progress Notes (Signed)
Subjective:    Patient ID: Hannah Cooper, female    DOB: [birth year], 64 y.o.   MRN: [id number]  HPI  Due to national recommendations of social distancing because of COVID 23, an audio/video tele-health visit is felt to be the most appropriate encounter for this patient at this time. See MyChart message from today for the patient's consent to a tele-health encounter with [place]. This is a follow up tele-visit via phone. The patient is at home. MD is at office.   She continues to have lower back pain. The patches and medication have been helping a lot. The pain is worst in the middle of the back but sometimes shoots across the bottom. Her husband applies the patches for her. She prefers no injections due to her cancer. She has relatives who are currently dying from cancer. She is still applying the gel as well.   BP has been better controlled with increase in Losartan.  She continues on Norco prescribed by other provider.   Prior history:  Hannah Cooper is a 64 year old woman who presents to establish care for lower back pain where she has a spinal tumor. She prefers pain medication to surgery. When she walks down the stairs it hurts. When she walked to the school with her daughter it was awful. She was given a medication which helps to relieve the pain. She has follow-up with oncology in 3 months. Average pain is 10/10. She tries to bear the pain has much as possible because she has children- they are 14 and 6, 12 and 13. She has adopted the three when they were infants.   The lidocaine patches helped a lot with her low back pain. The gel has been helping as well.  She has also been taking a pain medication with an H- she cannot recall exactly the name. I have reviewed her medication list and   She shows me a picture of one of her 65 year old daughters whom she adopted.   She says she eats a normal diet.  Exercise limited due to her pain.   She is  accompanied by her husband today.  Pain medications reviewed with her and she tries to use the ibuprofen and Tylenol sparingly (latter does not help much).   Pain Inventory Average Pain 8 Pain Right Now 8 My pain is constant, stabbing and aching  In the last 24 hours, has pain interfered with the following? General activity 8 Relation with others 0 Enjoyment of life 7 What TIME of day is your pain at its worst? varies Sleep (in general) Fair  Pain is worse with: walking and bending Pain improves with: rest and medication Relief from Meds: 7     Family History  Problem Relation Age of Onset  . Heart attack Mother 46  . Heart attack Sister 72   Social History   Socioeconomic History  . Marital status: Married    Spouse name: Not on file  . Number of children: Not on file  . Years of education: Not on file  . Highest education level: Not on file  Occupational History  . Not on file  Tobacco Use  . Smoking status: Never Smoker  . Smokeless tobacco: Never Used  Vaping Use  . Vaping Use: Never used  Substance and Sexual Activity  . Alcohol use: No    Alcohol/week: 0.0 standard drinks  . Drug use: No  . Sexual activity: Yes  Partners: Male  Other Topics Concern  . Not on file  Social History Narrative  . Not on file   Social Determinants of Health   Financial Resource Strain: Low Risk   . Difficulty of Paying Living Expenses: Not hard at all  Food Insecurity: No Food Insecurity  . Worried About Charity fundraiser in the Last Year: Never true  . Ran Out of Food in the Last Year: Never true  Transportation Needs: No Transportation Needs  . Lack of Transportation (Medical): No  . Lack of Transportation (Non-Medical): No  Physical Activity: Inactive  . Days of Exercise per Week: 0 days  . Minutes of Exercise per Session: 0 min  Stress: Stress Concern Present  . Feeling of Stress : To some extent  Social Connections: Socially Integrated  . Frequency of  Communication with Friends and Family: More than three times a week  . Frequency of Social Gatherings with Friends and Family: Never  . Attends Religious Services: More than 4 times per year  . Active Member of Clubs or Organizations: Yes  . Attends Archivist Meetings: More than 4 times per year  . Marital Status: Married   Past Surgical History:  Procedure Laterality Date  . ABDOMINAL HYSTERECTOMY    . IR FLUORO GUIDED NEEDLE PLC ASPIRATION/INJECTION LOC  06/19/2019  . LEFT HEART CATHETERIZATION WITH CORONARY ANGIOGRAM N/A 09/05/2012   Procedure: LEFT HEART CATHETERIZATION WITH CORONARY ANGIOGRAM;  Surgeon: Wellington Hampshire, MD;  Location: [facility];  Service: Cardiovascular;  Laterality: N/A;  . THYROID SURGERY  2002   Past Medical History:  Diagnosis Date  . Arthritis   . Atypical chest pain    chronic  . Cancer ([place])    bone cancer  . Chronic back pain   . Chronic diastolic CHF (congestive heart failure) ([place])   . COPD (chronic obstructive pulmonary disease) ([place])   . DDD (degenerative disc disease), cervical   . Hyperlipidemia   . Hypertension   . Lumbar radiculopathy   . Meningitis   . Normal coronary arteries 2014  . Palpitations   . Premature atrial contractions   . PVC's (premature ventricular contractions)    There were no vitals taken for this visit.  Opioid Risk Score:   Fall Risk Score:  `1  Depression screen PHQ 2/9  Depression screen [facility] 2/9 02/14/2020 01/09/2020 03/31/2015  Decreased Interest 0 1 0  Down, Depressed, Hopeless 0 0 1  PHQ - 2 Score 0 1 1  Altered sleeping - 0 0  Tired, decreased energy - 1 2  Change in appetite - 0 1  Feeling bad or failure about yourself  - 0 1  Trouble concentrating - 0 0  Moving slowly or fidgety/restless - 0 0  Suicidal thoughts - 0 0  PHQ-9 Score - 2 5  Difficult doing work/chores - Not difficult at all Somewhat difficult  Some recent data might be hidden    Review of Systems  Musculoskeletal:  Positive for back pain and gait problem.       Left leg pain  Neurological: Positive for numbness.  All other systems reviewed and are negative.      Objective:   Physical Exam Not performed as patient was seen via MyChart.     Assessment & Plan:  Hannah Cooper is a 64 year old woman who presents to establish care for pain secondary to spinal tumor.  1) Chronic Pain Syndrome secondary to spinal tumor (solitary plasmacytoma- Dr. Tomie China  note reviewed).  -Discussed current symptoms of pain and history of pain.  -Discussed following foods that may reduce pain: 1) Ginger 2) Blueberries 3) Salmon 4) Pumpkin seeds 5) dark chocolate 6) turmeric 7) tart cherries 8) virgin olive oil 9) chilli peppers 10) mint 11) red wine -Made goal to incorporate these foods into her diet.  -Discussed vitamin deficiencies associated with pain: Vitamin D- lab order and discussed.  -Continue ice to lower back BID.  -Continue gabapentin TID PRN.  -Lidocaine patch- increase to 2 patches- one on each side. These have been helping.  -Use Ibuprofen during the day if she has pain (not sedating) -Minimize narcotic use at this time due to risks of tolerance- can maximize more conservative agents first. Advised that this can be addicting. Currently taking 2 hydrocodone per day prescribed by another provider.  -Refilled lidocaine patch and diclofenac gel.   2) Cancer related fatigue: Iron level discussed and treatment options discussed -Discussed dietary options to help improve injury. Has been following a healthy diet.  3) Dry skin at radiation site: Apply coconut oil. This will also help with itching. If that does not improve, then asked her to call me regarding a script for hydrocortisone.   4) HTN: Prescribed increased dose of Losartan 50mg . BP has been well relatively well controlled with this change.   5) General health: Has been trying ketogenic diet.   All questions answered. RTC in 4 weeks. 10  minutes spent in discussion of lower back pain, use of lidocaine patches, heat, gel, healthy diet, HTN control, sending medication refills.

## 2020-03-30 ENCOUNTER — Other Ambulatory Visit: Payer: Self-pay

## 2020-03-30 ENCOUNTER — Ambulatory Visit
Admission: RE | Admit: 2020-03-30 | Discharge: 2020-03-30 | Disposition: A | Discharge status: Home / Self Care | Payer: No Typology Code available for payment source | Source: Ambulatory Visit | Attending: Radiation Oncology | Admitting: Radiation Oncology

## 2020-03-30 ENCOUNTER — Encounter: Payer: Self-pay | Admitting: Radiation Oncology

## 2020-03-30 VITALS — BP 141/90 | HR 49 | Temp 97.9°F | Resp 18 | Ht 65.0 in | Wt 225.4 lb

## 2020-03-30 DIAGNOSIS — Z79899 Other long term (current) drug therapy: Secondary | ICD-10-CM | POA: Insufficient documentation

## 2020-03-30 DIAGNOSIS — R531 Weakness: Secondary | ICD-10-CM | POA: Diagnosis not present

## 2020-03-30 DIAGNOSIS — C9030 Solitary plasmacytoma not having achieved remission: Secondary | ICD-10-CM | POA: Insufficient documentation

## 2020-03-30 NOTE — Progress Notes (Signed)
Radiation Oncology         [phone] ________________________________  Name: Hannah Cooper MRN: [id number]  Date: 03/30/2020  DOB: 1956/04/09  Follow-Up Visit Note  CC: Jake Samples, PA-C  Jake Samples, [place]*    ICD-10-CM   1. 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, unspecified plasmacytoma type [facility])  C90.30     Diagnosis: Plasmocytoma of L4 vertebral body  Interval Since Last Radiation: Seven months and one week  Radiation Treatment Dates: 07/16/2019 through 08/22/2019 Site Technique Total Dose (Gy) Dose per Fx (Gy) Completed Fx Beam Energies  Lumbar Spine: Spine 3D 50.4/50.4 1.8 28/28 15X    Narrative:  The patient returns today for routine follow-up. Since her last visit, she underwent an echocardiogram on 02/05/2020 that showed an EF of 65-70%. No other significant interval history.  On review of systems, she reports ongoing pain in the lower back and left pelvis region.  She has chronic mild weakness in the left lower extremity she is seen in the pain clinic.  They recommended consideration for shot to help with her pain but the patient did not wish to follow-up on this recommendation.. She denies bowel or bladder incontinence.  Overall pain level no better after receiving radiation therapy  ALLERGIES:  is allergic to other.  Meds: Current Outpatient Medications  Medication Sig Dispense Refill  . acetaminophen (TYLENOL) 500 MG tablet Take 1,000 mg by mouth every 6 (six) hours as needed for headache (pain).     Marland Kitchen albuterol (PROVENTIL) (2.5 MG/3ML) 0.083% nebulizer solution Take 2.5 mg by nebulization every 6 (six) hours as needed for wheezing or shortness of breath.     Marland Kitchen albuterol (VENTOLIN HFA) 108 (90 Base) MCG/ACT inhaler Inhale 1-2 puffs into the lungs every 4 (four) hours as needed for wheezing or shortness of breath. 18 g 0  . Albuterol Sulfate (PROAIR RESPICLICK) 546 (90 Base) MCG/ACT AEPB Inhale 2 puffs into the lungs every 4 (four) hours as  needed (shortness of breath/ wheezing).    . cycloSPORINE (RESTASIS) 0.05 % ophthalmic emulsion Place 1 drop into both eyes 2 (two) times daily.    . diclofenac Sodium (VOLTAREN) 1 % GEL Apply 2 g topically 4 (four) times daily. 100 g 3  . diltiazem (CARDIZEM) 30 MG tablet Take 1 tablet (30 mg total) by mouth 2 (two) times daily. 60 tablet 11  . Ferrous Sulfate (IRON) 325 (65 Fe) MG TABS Take 1 tablet (325 mg total) by mouth daily. 30 tablet 0  . furosemide (LASIX) 20 MG tablet Take 1 tablet daily as needed may take 2 tablets prn 60 tablet 1  . gabapentin (NEURONTIN) 300 MG capsule Take 1 capsule (300 mg total) by mouth at bedtime. 90 capsule 2  . glipiZIDE-metformin (METAGLIP) 5-500 MG tablet Take 1 tablet by mouth in the morning and at bedtime.    Marland Kitchen HYDROcodone-acetaminophen (NORCO) 10-325 MG tablet Take 1 tablet by mouth 2 (two) times daily as needed for moderate pain (pain.). 60 tablet 0  . ibuprofen (ADVIL,MOTRIN) 800 MG tablet Take 1 tablet by mouth 2 (two) times daily as needed (back pain).   2  . Insulin Glargine (BASAGLAR KWIKPEN) 100 UNIT/ML SMARTSIG:15 Unit(s) SUB-Q Every Night    . isosorbide mononitrate (IMDUR) 30 MG 24 hr tablet TAKE 1 TABLET (30 MG TOTAL) BY MOUTH DAILY. 90 tablet 0  . lidocaine (LIDODERM) 5 % Place 2 patches onto the skin daily. Remove & Discard patch within 12 hours or as directed by MD 60  patch 1  . losartan (COZAAR) 50 MG tablet Take 1 tablet (50 mg total) by mouth daily. 30 tablet 3  . metoprolol tartrate (LOPRESSOR) 100 MG tablet Take 100 mg by mouth 2 (two) times daily.    . mupirocin ointment (BACTROBAN) 2 % Apply 1 application topically 3 (three) times daily as needed.    . valACYclovir (VALTREX) 1000 MG tablet Take 1,000 mg by mouth 3 (three) times daily.    . chlorthalidone (HYGROTON) 25 MG tablet Take 1 tablet (25 mg total) by mouth daily. 90 tablet 3   No current facility-administered medications for this encounter.    Physical Findings: The patient  is in no acute distress. Patient is alert and oriented.  height is 5\' 5"  (1.651 m) and weight is 225 lb 6.4 oz (102.2 kg). Her temporal temperature is 97.9 F (36.6 C). Her blood pressure is 141/90 (abnormal) and her pulse is 49 (abnormal). Her respiration is 18 and oxygen saturation is 100%.  No significant changes. Lungs are clear to auscultation bilaterally. Heart has regular rate and rhythm. No palpable cervical, supraclavicular, or axillary adenopathy. Abdomen soft, non-tender, normal bowel sounds.  Patient was noted to have some mild weakness in the distal muscle groups of the left lower extremity. This has been noticed on previous exams.  She does have some difficulty getting up on the examination table in light of her weakness.  Lab Findings: Lab Results  Component Value Date   WBC 4.5 11/13/2019   HGB 11.3 (L) 11/13/2019   HCT 33.5 (L) 11/13/2019   MCV 89.8 11/13/2019   PLT 231 11/13/2019    Radiographic Findings: No results found.  Impression: Plasmocytoma of L4 vertebral body  Clinically stable at this time with no improvement in pain level from her radiation therapy  Plan: The patient will follow up with radiation oncology in March of next year.  She missed her follow-up with Dr. Delton Coombes in light of a potential exposure to Covid.  She plans on setting up this appointment in the near future.  She is due for repeat imaging in February.  I will see her after her imaging is complete.  Total time spent in this encounter was 20 minutes which included reviewing the patient's most recent interval history, echocardiogram, physical examination, and documentation.  ____________________________________  Blair Promise, PhD, MD  This document serves as a record of services personally performed by Gery Pray, MD. It was created on his behalf by Clerance Lav, a trained medical scribe. The creation of this record is based on the scribe's personal observations and the provider's  statements to them. This document has been checked and approved by the attending provider.

## 2020-03-30 NOTE — Progress Notes (Signed)
Patient in for one month follow up. Patient continues to have pain in her back. Does not feel like radiation has helped. Patient is having very little pain down her legs left leg is weaker than her right leg. No issues with incontinence of bowel or bladder.

## 2020-04-02 ENCOUNTER — Other Ambulatory Visit (HOSPITAL_COMMUNITY): Payer: Self-pay

## 2020-04-02 MED ORDER — HYDROCODONE-ACETAMINOPHEN 10-325 MG PO TABS: 1.0000 | ORAL_TABLET | Freq: Two times a day (BID) | ORAL | 0 refills | Status: DC | PRN

## 2020-04-06 NOTE — Progress Notes (Signed)
Cardiology Office Note    Date:  04/20/2020   ID:  Hannah Cooper, DOB 1956/03/10, MRN [id number]  PCP:  Jake Samples, PA-C  Cardiologist: Carlyle Dolly, MD EPS: None  Chief Complaint  Patient presents with  . Follow-up    History of Present Illness:  Hannah Cooper is a 64 y.o. female with history of HTN, palpitations-monitor 2018 PAC and PVC's on lopressor and diltiazem, history of atypical chest pain normal cardiac cath 2014, COPD, chronic diastolic CHF Echo 7078 LVEF 55 to 60% with abnormal diastolic function, dyspnea CPX 06/2014 mild to moderate reduced functional capacity   Patient was diagnosed with a spinal tumor solitary plasmacytoma and undergoing radiation.  Covid + 10/2019 after being vaccinated.    I saw the patient 02/03/2020 at which time her blood pressure was running high, legs were swelling and she  was taking Lasix more often.  Heart was also racing when she became aggravated. Echo 02/05/20 normal LVEF 65-70% mild LVH   LOV 02/18/20 she was under a lot of stress. I added losartan 25 mg daily but another MD called in 50 mg daily and she's taking both for a total of 75 mg daily . With elevated BP I increased it to 100 mg daily. bmet 03/18/20 Crt 0.86.  OV had to be changed to virtual b/c of snow. Patient says overall she has been doing well. Has had a lot of deaths in her family and her father is sick. BP is great today. Usually 140/90. Is taking losartan 100 mg daily and sometimes takes an extra 25 mg in the evening if BP high.  No other complaints.   Past Medical History:  Diagnosis Date  . Arthritis   . Atypical chest pain    chronic  . Cancer ([place])    bone cancer  . Chronic back pain   . Chronic diastolic CHF (congestive heart failure) ([place])   . COPD (chronic obstructive pulmonary disease) ([place])   . DDD (degenerative disc disease), cervical   . Hyperlipidemia   . Hypertension   . Lumbar radiculopathy   . Meningitis   . Normal coronary arteries  2014  . Palpitations   . Premature atrial contractions   . PVC's (premature ventricular contractions)     Past Surgical History:  Procedure Laterality Date  . ABDOMINAL HYSTERECTOMY    . IR FLUORO GUIDED NEEDLE PLC ASPIRATION/INJECTION LOC  06/19/2019  . LEFT HEART CATHETERIZATION WITH CORONARY ANGIOGRAM N/A 09/05/2012   Procedure: LEFT HEART CATHETERIZATION WITH CORONARY ANGIOGRAM;  Surgeon: Wellington Hampshire, MD;  Location: [facility] CATH LAB;  Service: Cardiovascular;  Laterality: N/A;  . THYROID SURGERY  2002    Current Medications: Current Meds  Medication Sig  . acetaminophen (TYLENOL) 500 MG tablet Take 1,000 mg by mouth every 6 (six) hours as needed for headache (pain).   Marland Kitchen albuterol (PROVENTIL) (2.5 MG/3ML) 0.083% nebulizer solution Take 2.5 mg by nebulization every 6 (six) hours as needed for wheezing or shortness of breath.   Marland Kitchen albuterol (VENTOLIN HFA) 108 (90 Base) MCG/ACT inhaler Inhale 1-2 puffs into the lungs every 4 (four) hours as needed for wheezing or shortness of breath.  . Albuterol Sulfate (PROAIR RESPICLICK) 675 (90 Base) MCG/ACT AEPB Inhale 2 puffs into the lungs every 4 (four) hours as needed (shortness of breath/ wheezing).  . cycloSPORINE (RESTASIS) 0.05 % ophthalmic emulsion Place 1 drop into both eyes 2 (two) times daily.  . diclofenac Sodium (VOLTAREN) 1 % GEL Apply 2 g  topically 4 (four) times daily.  Marland Kitchen diltiazem (CARDIZEM) 30 MG tablet Take 1 tablet (30 mg total) by mouth 2 (two) times daily.  . Ferrous Sulfate (IRON) 325 (65 Fe) MG TABS Take 1 tablet (325 mg total) by mouth daily.  . furosemide (LASIX) 20 MG tablet Take 1 tablet daily as needed may take 2 tablets prn  . gabapentin (NEURONTIN) 300 MG capsule Take 1 capsule (300 mg total) by mouth at bedtime.  Marland Kitchen glipiZIDE-metformin (METAGLIP) 5-500 MG tablet Take 1 tablet by mouth in the morning and at bedtime.  Marland Kitchen HYDROcodone-acetaminophen (NORCO) 10-325 MG tablet Take 1 tablet by mouth 2 (two) times daily as needed  for moderate pain (pain.).  Marland Kitchen ibuprofen (ADVIL,MOTRIN) 800 MG tablet Take 1 tablet by mouth 2 (two) times daily as needed (back pain).   . Insulin Glargine (BASAGLAR KWIKPEN) 100 UNIT/ML SMARTSIG:15 Unit(s) SUB-Q Every Night  . isosorbide mononitrate (IMDUR) 30 MG 24 hr tablet TAKE 1 TABLET (30 MG TOTAL) BY MOUTH DAILY.  Marland Kitchen lidocaine (LIDODERM) 5 % Place 2 patches onto the skin daily. Remove & Discard patch within 12 hours or as directed by MD  . losartan (COZAAR) 50 MG tablet Take 1 tablet (50 mg total) by mouth daily.  . metoprolol tartrate (LOPRESSOR) 100 MG tablet Take 100 mg by mouth 2 (two) times daily.  . mupirocin ointment (BACTROBAN) 2 % Apply 1 application topically 3 (three) times daily as needed.  Marland Kitchen RELION PEN NEEDLES 31G X 6 MM MISC USE 1 PEN NEEDLE TO INJECT INSULIN AT BEDTIME  . valACYclovir (VALTREX) 1000 MG tablet Take 1,000 mg by mouth 3 (three) times daily.     Allergies:   Other   Social History   Socioeconomic History  . Marital status: Married    Spouse name: Not on file  . Number of children: Not on file  . Years of education: Not on file  . Highest education level: Not on file  Occupational History  . Not on file  Tobacco Use  . Smoking status: Never Smoker  . Smokeless tobacco: Never Used  Vaping Use  . Vaping Use: Never used  Substance and Sexual Activity  . Alcohol use: No    Alcohol/week: 0.0 standard drinks  . Drug use: No  . Sexual activity: Yes    Partners: Male  Other Topics Concern  . Not on file  Social History Narrative  . Not on file   Social Determinants of Health   Financial Resource Strain: Low Risk   . Difficulty of Paying Living Expenses: Not hard at all  Food Insecurity: No Food Insecurity  . Worried About Charity fundraiser in the Last Year: Never true  . Ran Out of Food in the Last Year: Never true  Transportation Needs: No Transportation Needs  . Lack of Transportation (Medical): No  . Lack of Transportation (Non-Medical):  No  Physical Activity: Inactive  . Days of Exercise per Week: 0 days  . Minutes of Exercise per Session: 0 min  Stress: Stress Concern Present  . Feeling of Stress : To some extent  Social Connections: Socially Integrated  . Frequency of Communication with Friends and Family: More than three times a week  . Frequency of Social Gatherings with Friends and Family: Never  . Attends Religious Services: More than 4 times per year  . Active Member of Clubs or Organizations: Yes  . Attends Archivist Meetings: More than 4 times per year  . Marital Status: Married  Family History:  The patient's family history includes Heart attack (age of onset: 78) in her sister; Heart attack (age of onset: 22) in her mother.   ROS:   Please see the history of present illness.    ROS All other systems reviewed and are negative.   PHYSICAL EXAM:   VS:  BP 115/80   Ht _0  (1.651 m)   Wt 223 lb (101.2 kg)   BMI 37.11 kg/m   Physical Exam  Vitals reviewed  Wt Readings from Last 3 Encounters:  04/20/20 223 lb (101.2 kg)  03/30/20 225 lb 6.4 oz (102.2 kg)  03/19/20 221 lb (100.2 kg)      Studies/Labs Reviewed:   EKG:  EKG is not ordered today.    Recent Labs: 11/13/2019: ALT 36; Hemoglobin 11.3; Platelets 231 03/18/2020: BUN 15; Creat 0.86; Potassium 3.5; Sodium 141   Lipid Panel    Component Value Date/Time   CHOL 123 03/14/2016 0739   TRIG 66 11/13/2019 1355   HDL 46 03/14/2016 0739   CHOLHDL 2.7 03/14/2016 0739   VLDL 17 03/14/2016 0739   LDLCALC 60 03/14/2016 0739    Additional studies/ records that were reviewed today include:  2D echo 02/05/2020 IMPRESSIONS     1. Left ventricular ejection fraction, by estimation, is 65 to 70%. The  left ventricle has normal function. The left ventricle has no regional  wall motion abnormalities. There is mild left ventricular hypertrophy.  Left ventricular diastolic parameters  are indeterminate.   2. Right ventricular  systolic function is normal. The right ventricular  size is normal. Tricuspid regurgitation signal is inadequate for assessing  PA pressure.   3. The mitral valve is grossly normal. Mild mitral valve regurgitation.   4. The aortic valve is tricuspid. Aortic valve regurgitation is not  visualized.   5. The inferior vena cava is normal in size with greater than 50%  respiratory variability, suggesting right atrial pressure of 3 mmHg.   FINDINGS   Left Ventricle: Left ventricular ejection fraction, by estimation, is 65  to 70%. The left ventricle has normal function. The left ventricle has no  regional wall motion abnormalities. The left ventricular internal cavity  size was normal in size. There is   mild left ventricular hypertrophy. Left ventricular diastolic parameters  are indeterminate.   Right Ventricle: The right ventricular size is normal. No increase in  right ventricular wall thickness. Right ventricular systolic function is  normal. Tricuspid regurgitation signal is inadequate for assessing PA  pressure.   Left Atrium: Left atrial size was normal in size.   Right Atrium: Right atrial size was normal in size.   Pericardium: There is no evidence of pericardial effusion. Presence of  pericardial fat pad.   Mitral Valve: The mitral valve is grossly normal. Mild mitral valve  regurgitation.   Tricuspid Valve: The tricuspid valve is grossly normal. Tricuspid valve  regurgitation is trivial.   Aortic Valve: The aortic valve is tricuspid. There is mild aortic valve  annular calcification. Aortic valve regurgitation is not visualized.   Pulmonic Valve: The pulmonic valve was grossly normal. Pulmonic valve  regurgitation is trivial.   Aorta: The aortic root is normal in size and structure.   Venous: The inferior vena cava is normal in size with greater than 50%  respiratory variability, suggesting right atrial pressure of 3 mmHg.   IAS/Shunts: No atrial level shunt  detected by color flow Doppler.        08/2012 cath Hemodynamics:  AO:  164/82   mmHg LV:  169/14    mmHg LVEDP: 24  mmHg   Coronary angiography: Coronary dominance: Right     Left Main:  Normal  Left Anterior Descending (LAD):  Normal in size with no significant disease.  1st diagonal (D1):  Small in size with minor irregularities.  2nd diagonal (D2):  Normal in size with no significant disease.  3rd diagonal (D3):  Normal in size with no significant disease.  Circumflex (LCx):  Normal in size and nondominant. The vessel has no significant disease.  1st obtuse marginal:  Small in size with no significant disease.  2nd obtuse marginal:  Small in size with no significant disease.  3rd obtuse marginal:  Large in size with no significant disease.    Right Coronary Artery: Normal in size and dominant. The vessel has no significant disease.  posterior descending artery: Normal in size with no significant disease.  posterior lateral branchs:  Normal in size with no significant disease.   Left ventriculography: Left ventricular systolic function is normal , LVEF is estimated at 60-65% %, there is no significant mitral regurgitation    Final Conclusions:    1. Normal coronary arteries. 2. Normal LV systolic function. 3. Moderately elevated left ventricular end-diastolic pressure likely due to diastolic heart failure.     06/2013 echo Study Conclusions  - Procedure narrative: Transthoracic echocardiography. Image   quality was suboptimal. The study was technically   difficult, as a result of poor sound wave transmission and   restricted patient mobility. - Left ventricle: The cavity size was normal. Wall thickness   was increased in a pattern of mild to moderate LVH.   Diastolic dysfunction noted, grade indeterminant. Systolic   function was normal. The estimated ejection fraction was   in the range of 55% to 60%. Wall motion was normal; there   were no regional wall  motion abnormalities.     Jan 2017 Event monitor: no arrhythmias   06/2014 CPX Conclusion: Exercise testing with gas exchange demonstrates a mild-moderately reduced functional capacity when compared to matched sedentary norms. There was an in-adequate HR response to the exercise response and flat O2 pulse. This high HR reserve was likely high due to pulmonary limitations based on restrictive spirometry and RR 45 at peak exercise and reaching maximum ventilatory limits. However, it could have easily been due to poor effort. The sub-maximal effort is clouding interpretation of the test. Nevertheless, correlation of resting spirometry test  Showing restriction with outpatient pulmonary function test is warranted     05/2016 heart monitor  Telemetry tracings predominately show sinus rhythm  Reported symptoms correlate with occasional PVCs and PACs       02/2016 echo Study Conclusions   - Left ventricle: The cavity size was normal. Systolic function was   vigorous. The estimated ejection fraction was in the range of 65%   to 70%. Wall motion was normal; there were no regional wall   motion abnormalities. There was an increased relative   contribution of atrial contraction to ventricular filling.   Doppler parameters are consistent with abnormal left ventricular   relaxation (grade 1 diastolic dysfunction). - Pulmonic valve: There was trivial regurgitation. - Pulmonary arteries: Systolic pressure could not be accurately   estimated.          Risk Assessment/Calculations:         ASSESSMENT:    1. Palpitations   2. Essential hypertension   3. Chronic diastolic CHF (congestive heart failure) (  [place])   4. History of chest pain      PLAN:  In order of problems listed above:  Palpitations on metoprolol and diltiazem-has palpitations  when she was aggravated. Can take extra diltiazem as needed   Hypertension. I increased losartan 100 mg daily. Normal and echo normal  LVEF 65-70% with mild LVH.  Patient was taking an extra 25 mg in the evening of her blood pressure runs high.  Typical blood pressure 140-150/90-98.  Today is much lower.  Will increase chlorthalidone to 50 mg once daily.  Recheck be met in a couple week and asked her not to take extra losartan. Visit changed to virtual because of snow.    Chronic diastolic CHF compensated.2D echo 02/05/2020 normal LVEF 65 to 70% with mild LVH and diastolic parameters were indeterminate   History of chest pain with normal cardiac catheter in 2014       Shared Decision Making/Informed Consent        Medication Adjustments/Labs and Tests Ordered: Current medicines are reviewed at length with the patient today.  Concerns regarding medicines are outlined above.  Medication changes, Labs and Tests ordered today are listed in the Patient Instructions below. There are no Patient Instructions on file for this visit.   Sumner Boast, PA-C  04/20/2020 12:14 PM    [facility] [address], [place], [place]  [address] Phone: [phone]; Fax: [phone]

## 2020-04-09 ENCOUNTER — Other Ambulatory Visit (HOSPITAL_COMMUNITY): Payer: PRIVATE HEALTH INSURANCE

## 2020-04-09 ENCOUNTER — Ambulatory Visit (HOSPITAL_COMMUNITY): Payer: PRIVATE HEALTH INSURANCE

## 2020-04-18 HISTORY — PX: BREAST BIOPSY: SHX20

## 2020-04-20 ENCOUNTER — Encounter: Payer: Self-pay | Admitting: Physician Assistant

## 2020-04-20 ENCOUNTER — Telehealth: Payer: Self-pay | Admitting: Physician Assistant

## 2020-04-20 ENCOUNTER — Other Ambulatory Visit: Payer: Self-pay

## 2020-04-20 ENCOUNTER — Telehealth (INDEPENDENT_AMBULATORY_CARE_PROVIDER_SITE_OTHER): Payer: No Typology Code available for payment source | Admitting: Physician Assistant

## 2020-04-20 VITALS — BP 115/80 | Ht 65.0 in | Wt 223.0 lb

## 2020-04-20 DIAGNOSIS — I5032 Chronic diastolic (congestive) heart failure: Secondary | ICD-10-CM | POA: Diagnosis not present

## 2020-04-20 DIAGNOSIS — I10 Essential (primary) hypertension: Secondary | ICD-10-CM

## 2020-04-20 DIAGNOSIS — Z87898 Personal history of other specified conditions: Secondary | ICD-10-CM

## 2020-04-20 DIAGNOSIS — R002 Palpitations: Secondary | ICD-10-CM | POA: Diagnosis not present

## 2020-04-20 MED ORDER — CHLORTHALIDONE 50 MG PO TABS: 50.0000 mg | ORAL_TABLET | Freq: Every day | ORAL | 3 refills | Status: DC

## 2020-04-20 NOTE — Patient Instructions (Signed)
Medication Instructions:  Your physician has recommended you make the following change in your medication:   Increase Chlorthalidone to 50 mg Daily   *If you need a refill on your cardiac medications before your next appointment, please call your pharmacy*   Lab Work: NONE   If you have labs (blood work) drawn today and your tests are completely normal, you will receive your results only by: Marland Kitchen MyChart Message (if you have MyChart) OR . A paper copy in the mail If you have any lab test that is abnormal or we need to change your treatment, we will call you to review the results.   Testing/Procedures: NONE   Follow-Up: At [facility], you and your health needs are our priority.  As part of our continuing mission to provide you with exceptional heart care, we have created designated Provider Care Teams.  These Care Teams include your primary Cardiologist (physician) and Advanced Practice Providers (APPs -  Physician Assistants and Nurse Practitioners) who all work together to provide you with the care you need, when you need it.  We recommend signing up for the patient portal called "MyChart".  Sign up information is provided on this After Visit Summary.  MyChart is used to connect with patients for Virtual Visits (Telemedicine).  Patients are able to view lab/test results, encounter notes, upcoming appointments, etc.  Non-urgent messages can be sent to your provider as well.   To learn more about what you can do with MyChart, go to [email or web address].    Your next appointment:   1 month(s)  The format for your next appointment:   In Person  Provider:   Jacolyn Reedy, PA-C   Other Instructions Thank you for choosing [facility]!

## 2020-04-20 NOTE — Telephone Encounter (Signed)
  Patient Consent for Virtual Visit         Talulah KATLYNE NISHIDA has provided verbal consent on 04/20/2020 for a virtual visit (video or telephone).   CONSENT FOR VIRTUAL VISIT FOR:  Dayton Martes  By participating in this virtual visit I agree to the following:  I hereby voluntarily request, consent and authorize [facility] and its employed or contracted physicians, physician assistants, nurse practitioners or other licensed health care professionals (the Practitioner), to provide me with telemedicine health care services (the "Services") as deemed necessary by the treating Practitioner. I acknowledge and consent to receive the Services by the Practitioner via telemedicine. I understand that the telemedicine visit will involve communicating with the Practitioner through live audiovisual communication technology and the disclosure of certain medical information by electronic transmission. I acknowledge that I have been given the opportunity to request an in-person assessment or other available alternative prior to the telemedicine visit and am voluntarily participating in the telemedicine visit.  I understand that I have the right to withhold or withdraw my consent to the use of telemedicine in the course of my care at any time, without affecting my right to future care or treatment, and that the Practitioner or I may terminate the telemedicine visit at any time. I understand that I have the right to inspect all information obtained and/or recorded in the course of the telemedicine visit and may receive copies of available information for a reasonable fee.  I understand that some of the potential risks of receiving the Services via telemedicine include:  Marland Kitchen Delay or interruption in medical evaluation due to technological equipment failure or disruption; . Information transmitted may not be sufficient (e.g. poor resolution of images) to allow for appropriate medical decision making by the  Practitioner; and/or  . In rare instances, security protocols could fail, causing a breach of personal health information.  Furthermore, I acknowledge that it is my responsibility to provide information about my medical history, conditions and care that is complete and accurate to the best of my ability. I acknowledge that Practitioner's advice, recommendations, and/or decision may be based on factors not within their control, such as incomplete or inaccurate data provided by me or distortions of diagnostic images or specimens that may result from electronic transmissions. I understand that the practice of medicine is not an exact science and that Practitioner makes no warranties or guarantees regarding treatment outcomes. I acknowledge that a copy of this consent can be made available to me via my patient portal [facility] MyChart), or I can request a printed copy by calling the office of [facility].    I understand that my insurance will be billed for this visit.   I have read or had this consent read to me. . I understand the contents of this consent, which adequately explains the benefits and risks of the Services being provided via telemedicine.  . I have been provided ample opportunity to ask questions regarding this consent and the Services and have had my questions answered to my satisfaction. . I give my informed consent for the services to be provided through the use of telemedicine in my medical care

## 2020-04-23 ENCOUNTER — Encounter
Payer: PRIVATE HEALTH INSURANCE | Attending: Physical Medicine and Rehabilitation | Admitting: Physical Medicine and Rehabilitation

## 2020-04-23 ENCOUNTER — Other Ambulatory Visit: Payer: Self-pay

## 2020-04-23 ENCOUNTER — Encounter: Payer: Self-pay | Admitting: Physical Medicine and Rehabilitation

## 2020-04-23 VITALS — BP 101/68 | HR 70 | Temp 98.7°F | Ht 65.0 in | Wt 225.8 lb

## 2020-04-23 DIAGNOSIS — I10 Essential (primary) hypertension: Secondary | ICD-10-CM | POA: Diagnosis not present

## 2020-04-23 DIAGNOSIS — G8929 Other chronic pain: Secondary | ICD-10-CM | POA: Insufficient documentation

## 2020-04-23 DIAGNOSIS — R5382 Chronic fatigue, unspecified: Secondary | ICD-10-CM | POA: Diagnosis not present

## 2020-04-23 DIAGNOSIS — M545 Low back pain, unspecified: Secondary | ICD-10-CM | POA: Insufficient documentation

## 2020-04-23 MED ORDER — LIDOCAINE 5 % EX PTCH
2.0000 | MEDICATED_PATCH | CUTANEOUS | 1 refills | Status: DC
Start: 2020-04-23 — End: 2023-01-27

## 2020-04-23 MED ORDER — GABAPENTIN 100 MG PO CAPS: 100.0000 mg | ORAL_CAPSULE | Freq: Three times a day (TID) | ORAL | 0 refills | Status: DC | PRN

## 2020-04-23 MED ORDER — N-ACETYL CYSTEINE 600 MG PO TABS: 1.0000 | ORAL_TABLET | Freq: Every day | ORAL | 1 refills | Status: DC

## 2020-04-23 NOTE — Progress Notes (Signed)
Subjective:    Patient ID: Hannah Cooper, female    DOB: 01-19-1956, 65 y.o.   MRN: [id number]  HPI  Hannah Cooper is a 21 year ols woman who presents for follow-up of her low back pain.  1) Quality of life: She has been having some stress from deaths in her family recently due to cancer and COVID, and fro caring for her father with Alzheimer's.   2) Lower back pain: She continues to have lower back pain. The patches and medication have been helping a lot. The pain is worst in the middle of the back but sometimes shoots across the bottom. Her husband applies the patches for her. She prefers no injections due to her cancer. She has relatives who are currently dying from cancer. She is still applying the gel as well. She uses the hydrocodone sparingly.   3) HTN: BP has been better controlled with increase in Losartan. Today her BP is 101/68.   4) General health: It has been hard or her to exercise due to her back pain.   Prior history:  Hannah Cooper is a 65 year old woman who presents to establish care for lower back pain where she has a spinal tumor. She prefers pain medication to surgery. When she walks down the stairs it hurts. When she walked to the school with her Hannah it was awful. She was given a medication which helps to relieve the pain. She has follow-up with oncology in 3 months. Average pain is 10/10. She tries to bear the pain has much as possible because she has children- they are 14 and 6, 12 and 13. She has adopted the three when they were infants.   The lidocaine patches helped a lot with her low back pain. The gel has been helping as well.  She has also been taking a pain medication with an H- she cannot recall exactly the name. I have reviewed her medication list and   She shows me a picture of one of her 38 year old daughters whom she adopted.   She says she eats a normal diet.  Exercise limited due to her pain.   She is accompanied by her husband today.  Pain  medications reviewed with her and she tries to use the ibuprofen and Tylenol sparingly (latter does not help much).   Pain Inventory Average Pain 8 Pain Right Now 8 My pain is constant, sharp, burning and aching  In the last 24 hours, has pain interfered with the following? General activity 8 Relation with others 8 Enjoyment of life 8 What TIME of day is your pain at its worst? varies Sleep (in general) Fair  Pain is worse with: walking, bending, sitting, standing and some activites Pain improves with: rest and medication Relief from Meds: 8     Family History  Problem Relation Age of Onset  . Heart attack Mother 33  . Heart attack Sister 76   Social History   Socioeconomic History  . Marital status: Married    Spouse name: Not on file  . Number of children: Not on file  . Years of education: Not on file  . Highest education level: Not on file  Occupational History  . Not on file  Tobacco Use  . Smoking status: Never Smoker  . Smokeless tobacco: Never Used  Vaping Use  . Vaping Use: Never used  Substance and Sexual Activity  . Alcohol use: No    Alcohol/week: 0.0 standard drinks  .  Drug use: No  . Sexual activity: Yes    Partners: Male  Other Topics Concern  . Not on file  Social History Narrative  . Not on file   Social Determinants of Health   Financial Resource Strain: Low Risk   . Difficulty of Paying Living Expenses: Not hard at all  Food Insecurity: No Food Insecurity  . Worried About Charity fundraiser in the Last Year: Never true  . Ran Out of Food in the Last Year: Never true  Transportation Needs: No Transportation Needs  . Lack of Transportation (Medical): No  . Lack of Transportation (Non-Medical): No  Physical Activity: Inactive  . Days of Exercise per Week: 0 days  . Minutes of Exercise per Session: 0 min  Stress: Stress Concern Present  . Feeling of Stress : To some extent  Social Connections: Socially Integrated  . Frequency of  Communication with Friends and Family: More than three times a week  . Frequency of Social Gatherings with Friends and Family: Never  . Attends Religious Services: More than 4 times per year  . Active Member of Clubs or Organizations: Yes  . Attends Archivist Meetings: More than 4 times per year  . Marital Status: Married   Past Surgical History:  Procedure Laterality Date  . ABDOMINAL HYSTERECTOMY    . IR FLUORO GUIDED NEEDLE PLC ASPIRATION/INJECTION LOC  06/19/2019  . LEFT HEART CATHETERIZATION WITH CORONARY ANGIOGRAM N/A 09/05/2012   Procedure: LEFT HEART CATHETERIZATION WITH CORONARY ANGIOGRAM;  Surgeon: Wellington Hampshire, MD;  Location: [facility] CATH [facility];  Service: Cardiovascular;  Laterality: N/A;  . THYROID SURGERY  2002   Past Medical History:  Diagnosis Date  . Arthritis   . Atypical chest pain    chronic  . Cancer ([place])    bone cancer  . Chronic back pain   . Chronic diastolic CHF (congestive heart failure) ([place])   . COPD (chronic obstructive pulmonary disease) ([place])   . DDD (degenerative disc disease), cervical   . Hyperlipidemia   . Hypertension   . Lumbar radiculopathy   . Meningitis   . Normal coronary arteries 2014  . Palpitations   . Premature atrial contractions   . PVC's (premature ventricular contractions)    BP 101/68   Pulse 70   Temp 98.7 F (37.1 C)   Ht 5\' 5"  (1.651 m)   Wt 225 lb 12.8 oz (102.4 kg)   BMI 37.58 kg/m   Opioid Risk Score:   Fall Risk Score:  `1  Depression screen PHQ 2/9  Depression screen [facility] 2/9 03/19/2020 02/14/2020 01/09/2020 03/31/2015  Decreased Interest 0 0 1 0  Down, Depressed, Hopeless 0 0 0 1  PHQ - 2 Score 0 0 1 1  Altered sleeping - - 0 0  Tired, decreased energy - - 1 2  Change in appetite - - 0 1  Feeling bad or failure about yourself  - - 0 1  Trouble concentrating - - 0 0  Moving slowly or fidgety/restless - - 0 0  Suicidal thoughts - - 0 0  PHQ-9 Score - - 2 5  Difficult doing work/chores - - Not  difficult at all Somewhat difficult  Some recent data might be hidden    Review of Systems  Constitutional: Negative.   HENT: Negative.   Eyes: Negative.   Respiratory: Negative.   Cardiovascular: Negative.   Gastrointestinal: Negative.   Endocrine: Negative.   Genitourinary: Negative.   Musculoskeletal: Positive for back pain  and gait problem.       Left leg pain  Skin: Negative.   Allergic/Immunologic: Negative.   Neurological: Positive for numbness.  Hematological: Negative.   Psychiatric/Behavioral: Negative.   All other systems reviewed and are negative.      Objective:   Physical Exam Gen: no distress, normal appearing HEENT: oral mucosa pink and moist, NCAT Cardio: Reg rate Chest: normal effort, normal rate of breathing Abd: soft, non-distended Ext: no edema Skin: intact Neuro: Alert and oriented x3 Musculoskeletal: Antalgic gait. Walking into office today. No tenderness to palpation. Limited flexion and extension by pain.  Psych: pleasant, normal affect    Assessment & Plan:  Mrs. Hornbaker is a 65 year old woman who presents for follow-up of low back pain secondary to spinal tumor.  1) Chronic Pain Syndrome secondary to spinal tumor (solitary plasmacytoma- Dr. Marice Potter note reviewed).  -Discussed current symptoms of pain and history of pain.  -Discussed following foods that may reduce pain: 1) Ginger 2) Blueberries 3) Salmon 4) Pumpkin seeds 5) dark chocolate 6) turmeric- has been taking capsules. Counseled regarding purchasing the bright yellow powder from her grocery store.  7) tart cherries 8) virgin olive oil 9) chilli peppers 10) mint -Made goal to incorporate these foods into her diet.  -Discussed vitamin deficiencies associated with pain: Vitamin D- lab order and discussed.  -Continue ice to lower back BID.  -Continue gabapentin TID PRN. Provided refill.  -Lidocaine patch- increase to 2 patches- one on each side. These have been helping.   -Use Ibuprofen during the day if she has pain (not sedating) -Minimize narcotic use at this time due to risks of tolerance- can maximize more conservative agents first. Advised that this can be addicting. Currently taking 2 hydrocodone per day prescribed by another provider.  -Refilled lidocaine patch and diclofenac gel.   2) Cancer related fatigue: Iron level discussed and treatment options discussed -Discussed dietary options to help improve injury. Has been following a healthy diet. -Prescribed N-acetyl cysteine.   3) Dry skin at radiation site: Apply coconut oil. This will also help with itching. If that does not improve, then asked her to call me regarding a script for hydrocortisone.   4) HTN: Prescribed increased dose of Losartan 50mg . BP has been well relatively well controlled with this change. Log and bring into PCP.   5) General health: Has been trying ketogenic diet. Advised regarding limiting sugar intake. Commended on decreasing sodas.    All questions answered. RTC in 4 weeks.

## 2020-04-24 DIAGNOSIS — E7849 Other hyperlipidemia: Secondary | ICD-10-CM | POA: Diagnosis not present

## 2020-04-24 DIAGNOSIS — M25512 Pain in left shoulder: Secondary | ICD-10-CM | POA: Diagnosis not present

## 2020-04-24 DIAGNOSIS — E118 Type 2 diabetes mellitus with unspecified complications: Secondary | ICD-10-CM | POA: Diagnosis not present

## 2020-04-24 DIAGNOSIS — I10 Essential (primary) hypertension: Secondary | ICD-10-CM | POA: Diagnosis not present

## 2020-04-24 DIAGNOSIS — Z6838 Body mass index (BMI) 38.0-38.9, adult: Secondary | ICD-10-CM | POA: Diagnosis not present

## 2020-05-01 ENCOUNTER — Other Ambulatory Visit: Payer: Self-pay

## 2020-05-01 ENCOUNTER — Other Ambulatory Visit (HOSPITAL_COMMUNITY)
Admission: RE | Admit: 2020-05-01 | Discharge: 2020-05-01 | Disposition: A | Discharge status: Home / Self Care | Payer: No Typology Code available for payment source | Source: Ambulatory Visit | Attending: Family Medicine | Admitting: Family Medicine

## 2020-05-01 ENCOUNTER — Inpatient Hospital Stay (HOSPITAL_COMMUNITY): Payer: No Typology Code available for payment source | Attending: Family Medicine

## 2020-05-01 DIAGNOSIS — M899 Disorder of bone, unspecified: Secondary | ICD-10-CM

## 2020-05-01 DIAGNOSIS — R413 Other amnesia: Secondary | ICD-10-CM | POA: Insufficient documentation

## 2020-05-01 DIAGNOSIS — I10 Essential (primary) hypertension: Secondary | ICD-10-CM | POA: Diagnosis not present

## 2020-05-01 DIAGNOSIS — Z6838 Body mass index (BMI) 38.0-38.9, adult: Secondary | ICD-10-CM | POA: Diagnosis not present

## 2020-05-01 DIAGNOSIS — E7849 Other hyperlipidemia: Secondary | ICD-10-CM | POA: Insufficient documentation

## 2020-05-01 DIAGNOSIS — C9030 Solitary plasmacytoma not having achieved remission: Secondary | ICD-10-CM

## 2020-05-01 LAB — CBC WITH DIFFERENTIAL/PLATELET
Abs Immature Granulocytes: 0.01 10*3/uL (ref 0.00–0.07)
Abs Immature Granulocytes: 0 10*3/uL (ref 0.00–0.07)
Basophils Absolute: 0 10*3/uL (ref 0.0–0.1)
Basophils Absolute: 0 10*3/uL (ref 0.0–0.1)
Basophils Relative: 0 %
Basophils Relative: 0 %
Eosinophils Absolute: 0.1 10*3/uL (ref 0.0–0.5)
Eosinophils Absolute: 0.1 10*3/uL (ref 0.0–0.5)
Eosinophils Relative: 3 %
Eosinophils Relative: 4 %
HCT: 37.3 % (ref 36.0–46.0)
HCT: 37.4 % (ref 36.0–46.0)
Hemoglobin: 12.5 g/dL (ref 12.0–15.0)
Hemoglobin: 12.4 g/dL (ref 12.0–15.0)
Immature Granulocytes: 0 %
Immature Granulocytes: 0 %
Lymphocytes Relative: 23 %
Lymphocytes Relative: 23 %
Lymphs Abs: 0.7 10*3/uL (ref 0.7–4.0)
Lymphs Abs: 0.7 10*3/uL (ref 0.7–4.0)
MCH: 30.4 pg (ref 26.0–34.0)
MCH: 30.4 pg (ref 26.0–34.0)
MCHC: 33.5 g/dL (ref 30.0–36.0)
MCHC: 33.2 g/dL (ref 30.0–36.0)
MCV: 90.8 fL (ref 80.0–100.0)
MCV: 91.7 fL (ref 80.0–100.0)
Monocytes Absolute: 0.3 10*3/uL (ref 0.1–1.0)
Monocytes Absolute: 0.3 10*3/uL (ref 0.1–1.0)
Monocytes Relative: 9 %
Monocytes Relative: 9 %
Neutro Abs: 2 10*3/uL (ref 1.7–7.7)
Neutro Abs: 1.9 10*3/uL (ref 1.7–7.7)
Neutrophils Relative %: 65 %
Neutrophils Relative %: 64 %
Platelets: 246 10*3/uL (ref 150–400)
Platelets: 246 10*3/uL (ref 150–400)
RBC: 4.11 MIL/uL (ref 3.87–5.11)
RBC: 4.08 MIL/uL (ref 3.87–5.11)
RDW: 13.4 % (ref 11.5–15.5)
RDW: 13.7 % (ref 11.5–15.5)
WBC: 3.1 10*3/uL — ABNORMAL LOW (ref 4.0–10.5)
WBC: 3 10*3/uL — ABNORMAL LOW (ref 4.0–10.5)
nRBC: 0 % (ref 0.0–0.2)
nRBC: 0 % (ref 0.0–0.2)

## 2020-05-01 LAB — COMPREHENSIVE METABOLIC PANEL
ALT: 29 U/L (ref 0–44)
ALT: 29 U/L (ref 0–44)
AST: 24 U/L (ref 15–41)
AST: 23 U/L (ref 15–41)
Albumin: 4.3 g/dL (ref 3.5–5.0)
Albumin: 4.4 g/dL (ref 3.5–5.0)
Alkaline Phosphatase: 62 U/L (ref 38–126)
Alkaline Phosphatase: 65 U/L (ref 38–126)
Anion gap: 10 (ref 5–15)
Anion gap: 11 (ref 5–15)
BUN: 19 mg/dL (ref 8–23)
BUN: 19 mg/dL (ref 8–23)
CO2: 29 mmol/L (ref 22–32)
CO2: 27 mmol/L (ref 22–32)
Calcium: 9.4 mg/dL (ref 8.9–10.3)
Calcium: 9.3 mg/dL (ref 8.9–10.3)
Chloride: 98 mmol/L (ref 98–111)
Chloride: 98 mmol/L (ref 98–111)
Creatinine, Ser: 1.03 mg/dL — ABNORMAL HIGH (ref 0.44–1.00)
Creatinine, Ser: 1 mg/dL (ref 0.44–1.00)
GFR, Estimated: >60 mL/min (ref >60)
GFR, Estimated: >60 mL/min (ref >60)
Glucose, Bld: 181 mg/dL — ABNORMAL HIGH (ref 70–99)
Glucose, Bld: 159 mg/dL — ABNORMAL HIGH (ref 70–99)
Potassium: 3.5 mmol/L (ref 3.5–5.1)
Potassium: 3.5 mmol/L (ref 3.5–5.1)
Sodium: 137 mmol/L (ref 135–145)
Sodium: 136 mmol/L (ref 135–145)
Total Bilirubin: 0.6 mg/dL (ref 0.3–1.2)
Total Bilirubin: 0.7 mg/dL (ref 0.3–1.2)
Total Protein: 8.5 g/dL — ABNORMAL HIGH (ref 6.5–8.1)
Total Protein: 8.5 g/dL — ABNORMAL HIGH (ref 6.5–8.1)

## 2020-05-01 LAB — LACTATE DEHYDROGENASE: LDH: 151 U/L (ref 98–192)

## 2020-05-01 LAB — TSH: TSH: 2.355 u[IU]/mL (ref 0.350–4.500)

## 2020-05-01 LAB — LIPID PANEL
Cholesterol: 157 mg/dL (ref 0–200)
HDL: 43 mg/dL (ref >40)
LDL Cholesterol: 89 mg/dL (ref 0–99)
Total CHOL/HDL Ratio: 3.7 RATIO
Triglycerides: 127 mg/dL (ref <150)
VLDL: 25 mg/dL (ref 0–40)

## 2020-05-01 LAB — VITAMIN D 25 HYDROXY (VIT D DEFICIENCY, FRACTURES): Vit D, 25-Hydroxy: 47.02 ng/mL (ref 30–100)

## 2020-05-01 LAB — VITAMIN B12: Vitamin B-12: 210 pg/mL (ref 180–914)

## 2020-05-04 ENCOUNTER — Other Ambulatory Visit (HOSPITAL_COMMUNITY): Payer: PRIVATE HEALTH INSURANCE

## 2020-05-04 LAB — KAPPA/LAMBDA LIGHT CHAINS
Kappa free light chain: 28.7 mg/L — ABNORMAL HIGH (ref 3.3–19.4)
Kappa, lambda light chain ratio: 1.39 (ref 0.26–1.65)
Lambda free light chains: 20.6 mg/L (ref 5.7–26.3)

## 2020-05-04 LAB — PROTEIN ELECTROPHORESIS, SERUM
A/G Ratio: 1.1 (ref 0.7–1.7)
Albumin ELP: 4 g/dL (ref 2.9–4.4)
Alpha-1-Globulin: 0.2 g/dL (ref 0.0–0.4)
Alpha-2-Globulin: 0.7 g/dL (ref 0.4–1.0)
Beta Globulin: 1.1 g/dL (ref 0.7–1.3)
Gamma Globulin: 1.8 g/dL (ref 0.4–1.8)
Globulin, Total: 3.8 g/dL (ref 2.2–3.9)
Total Protein ELP: 7.8 g/dL (ref 6.0–8.5)

## 2020-05-05 LAB — IMMUNOFIXATION ELECTROPHORESIS
IgA: 272 mg/dL (ref 87–352)
IgG (Immunoglobin G), Serum: 1908 mg/dL — ABNORMAL HIGH (ref 586–1602)
IgM (Immunoglobulin M), Srm: 150 mg/dL (ref 26–217)
Total Protein ELP: 8 g/dL (ref 6.0–8.5)

## 2020-05-11 ENCOUNTER — Encounter (HOSPITAL_COMMUNITY): Payer: Self-pay | Admitting: Hematology

## 2020-05-11 ENCOUNTER — Inpatient Hospital Stay (HOSPITAL_BASED_OUTPATIENT_CLINIC_OR_DEPARTMENT_OTHER): Payer: No Typology Code available for payment source | Admitting: Hematology

## 2020-05-11 DIAGNOSIS — C9030 Solitary plasmacytoma not having achieved remission: Secondary | ICD-10-CM | POA: Diagnosis not present

## 2020-05-11 MED ORDER — HYDROCODONE-ACETAMINOPHEN 10-325 MG PO TABS: 1.0000 | ORAL_TABLET | Freq: Two times a day (BID) | ORAL | 0 refills | Status: DC | PRN

## 2020-05-11 NOTE — Progress Notes (Signed)
Virtual Visit via Telephone Note  I connected with Hannah Cooper on 05/11/20 at  2:00 PM EST by telephone and verified that I am speaking with the correct person using two identifiers.  Location: Patient: At home Provider: In the office   I discussed the limitations, risks, security and privacy concerns of performing an evaluation and management service by telephone and the availability of in person appointments. I also discussed with the patient that there may be a patient responsible charge related to this service. The patient expressed understanding and agreed to proceed.   History of Present Illness: She was seen in our office for plasmacytoma of L4 vertebral body. She has completed radiation to L4 vertebral body on 08/22/2019.   Observations/Objective: She reports lower back pain. She is using lidocaine 5% patch. She is also taking hydrocodone twice daily. She requests refill of the pain medication. Pain is rated as 8 out of 10. Appetite is 75%. Energy levels are 25%.  Assessment and Plan:  1. Plasmacytoma of L4 vertebral body: -Reviewed myeloma labs from 05/01/2020. SPEP is negative. Kappa light chains are 28.7 with ratio normal. Immunofixation is normal. Hemoglobin is 12.4. Creatinine is 1.0 and calcium is 9.3. -Recommend repeating myeloma labs. Also recommend repeating MRI of the lumbar spine in 3 months with follow-up.  2. Low back pain: -Continue lidocaine 5% patch. -Continue hydrocodone twice daily as needed. I have sent a refill.  Follow Up Instructions: RTC 3 months with labs and scan.   I discussed the assessment and treatment plan with the patient. The patient was provided an opportunity to ask questions and all were answered. The patient agreed with the plan and demonstrated an understanding of the instructions.   The patient was advised to call back or seek an in-person evaluation if the symptoms worsen or if the condition fails to improve as anticipated.  I  provided 12 minutes of non-face-to-face time during this encounter.   Derek Jack, MD

## 2020-05-13 NOTE — Progress Notes (Signed)
Virtual Visit via Telephone Note   This visit type was conducted due to national recommendations for restrictions regarding the COVID-19 Pandemic (e.g. social distancing) in an effort to limit this patient's exposure and mitigate transmission in our community.  Due to her co-morbid illnesses, this patient is at least at moderate risk for complications without adequate follow up.  This format is felt to be most appropriate for this patient at this time.  The patient did not have access to video technology/had technical difficulties with video requiring transitioning to audio format only (telephone).  All issues noted in this document were discussed and addressed.  No physical exam could be performed with this format.  Please refer to the patient's chart for her  consent to telehealth for [facility].    Date:  05/25/2020   ID:  Hannah Cooper, DOB 06/23/1955, MRN [id number] The patient was identified using 2 identifiers.  Patient Location: Home Provider Location: Office/Clinic  PCP:  Jake Samples, PA-C  Cardiologist:  Carlyle Dolly, MD   Electrophysiologist:  None   Evaluation Performed:  Follow-Up Visit  Chief Complaint:  Follow  History of Present Illness:    Hannah Cooper is a 65 y.o. female with  history of HTN, palpitations-monitor 2018 PAC and PVC's on lopressor and diltiazem, history of atypical chest pain normal cardiac cath 2014, COPD, chronic diastolic CHF Echo 2671 LVEF 55 to 60% with abnormal diastolic function, dyspnea CPX 06/2014 mild to moderate reduced functional capacity   Patient was diagnosed with a spinal tumor solitary plasmacytoma and underwent radiation.  Covid + 10/2019 after being vaccinated.    I saw the patient 02/03/2020 at which time her blood pressure was running high, legs were swelling and she  was taking Lasix more often.  Heart was also racing when she became aggravated. Echo 02/05/20 normal LVEF 65-70% mild LVH   LOV 02/18/20 she was  under a lot of stress. I added losartan 25 mg daily but another MD called in 50 mg daily and she's taking both for a total of 75 mg daily . With elevated BP I increased it to 100 mg daily. bmet 03/18/20 Crt 0.86.  Last virtual visit b/c of snow BP still running high 140/90 and she was taking an extra losartan 25 mg at night. I increased her chlorthalidone 50 mg daily and told her not to take the extra losartan.   Visit today switch to virtual because hospice came today for her father. BP has been doing so much better. She changed her eating some as well.       The patient does not have symptoms concerning for COVID-19 infection (fever, chills, cough, or new shortness of breath).    Past Medical History:  Diagnosis Date  . Arthritis   . Atypical chest pain    chronic  . Cancer ([place])    bone cancer  . Chronic back pain   . Chronic diastolic CHF (congestive heart failure) ([place])   . COPD (chronic obstructive pulmonary disease) ([place])   . DDD (degenerative disc disease), cervical   . Hyperlipidemia   . Hypertension   . Lumbar radiculopathy   . Meningitis   . Normal coronary arteries 2014  . Palpitations   . Premature atrial contractions   . PVC's (premature ventricular contractions)    Past Surgical History:  Procedure Laterality Date  . ABDOMINAL HYSTERECTOMY    . IR FLUORO GUIDED NEEDLE PLC ASPIRATION/INJECTION LOC  06/19/2019  . LEFT HEART CATHETERIZATION WITH  CORONARY ANGIOGRAM N/A 09/05/2012   Procedure: LEFT HEART CATHETERIZATION WITH CORONARY ANGIOGRAM;  Surgeon: Wellington Hampshire, MD;  Location: [facility];  Service: Cardiovascular;  Laterality: N/A;  . THYROID SURGERY  2002     Current Meds  Medication Sig  . acetaminophen (TYLENOL) 500 MG tablet Take 1,000 mg by mouth every 6 (six) hours as needed for headache (pain).   Marland Kitchen albuterol (PROVENTIL) (2.5 MG/3ML) 0.083% nebulizer solution Take 2.5 mg by nebulization every 6 (six) hours as needed for wheezing or shortness of  breath.   Marland Kitchen albuterol (VENTOLIN HFA) 108 (90 Base) MCG/ACT inhaler Inhale 1-2 puffs into the lungs every 4 (four) hours as needed for wheezing or shortness of breath.  . chlorthalidone (HYGROTON) 50 MG tablet Take 1 tablet (50 mg total) by mouth daily.  . cycloSPORINE (RESTASIS) 0.05 % ophthalmic emulsion Place 1 drop into both eyes 2 (two) times daily.  . diclofenac Sodium (VOLTAREN) 1 % GEL Apply 2 g topically 4 (four) times daily.  Marland Kitchen diltiazem (CARDIZEM) 30 MG tablet Take 1 tablet (30 mg total) by mouth 2 (two) times daily.  . furosemide (LASIX) 20 MG tablet Take 1 tablet daily as needed may take 2 tablets prn  . gabapentin (NEURONTIN) 100 MG capsule Take 1 capsule (100 mg total) by mouth 3 (three) times daily as needed.  Marland Kitchen glipiZIDE-metformin (METAGLIP) 5-500 MG tablet Take 1 tablet by mouth in the morning and at bedtime.  Marland Kitchen HYDROcodone-acetaminophen (NORCO) 10-325 MG tablet Take 1 tablet by mouth 2 (two) times daily as needed for moderate pain (pain.).  Marland Kitchen ibuprofen (ADVIL,MOTRIN) 800 MG tablet Take 1 tablet by mouth 2 (two) times daily as needed (back pain).   . Insulin Glargine (BASAGLAR KWIKPEN) 100 UNIT/ML SMARTSIG:15 Unit(s) SUB-Q Every Night  . isosorbide mononitrate (IMDUR) 30 MG 24 hr tablet TAKE 1 TABLET (30 MG TOTAL) BY MOUTH DAILY.  Marland Kitchen lidocaine (LIDODERM) 5 % Place 2 patches onto the skin daily. Remove & Discard patch within 12 hours or as directed by MD  . losartan (COZAAR) 100 MG tablet Take 100 mg by mouth daily.  . metoprolol tartrate (LOPRESSOR) 100 MG tablet Take 100 mg by mouth 2 (two) times daily.  . mupirocin ointment (BACTROBAN) 2 % Apply 1 application topically 3 (three) times daily as needed.  Marland Kitchen RELION PEN NEEDLES 31G X 6 MM MISC USE 1 PEN NEEDLE TO INJECT INSULIN AT BEDTIME     Allergies:   Other   Social History   Tobacco Use  . Smoking status: Never Smoker  . Smokeless tobacco: Never Used  Vaping Use  . Vaping Use: Never used  Substance Use Topics  .  Alcohol use: No    Alcohol/week: 0.0 standard drinks  . Drug use: No     Family Hx: The patient's family history includes Heart attack (age of onset: 66) in her sister; Heart attack (age of onset: 38) in her mother.  ROS:   Please see the history of present illness.      All other systems reviewed and are negative.   Prior CV studies:   The following studies were reviewed today:  2D echo 02/05/2020 IMPRESSIONS     1. Left ventricular ejection fraction, by estimation, is 65 to 70%. The  left ventricle has normal function. The left ventricle has no regional  wall motion abnormalities. There is mild left ventricular hypertrophy.  Left ventricular diastolic parameters  are indeterminate.   2. Right ventricular systolic function is normal. The  right ventricular  size is normal. Tricuspid regurgitation signal is inadequate for assessing  PA pressure.   3. The mitral valve is grossly normal. Mild mitral valve regurgitation.   4. The aortic valve is tricuspid. Aortic valve regurgitation is not  visualized.   5. The inferior vena cava is normal in size with greater than 50%  respiratory variability, suggesting right atrial pressure of 3 mmHg.   FINDINGS   Left Ventricle: Left ventricular ejection fraction, by estimation, is 65  to 70%. The left ventricle has normal function. The left ventricle has no  regional wall motion abnormalities. The left ventricular internal cavity  size was normal in size. There is   mild left ventricular hypertrophy. Left ventricular diastolic parameters  are indeterminate.   Right Ventricle: The right ventricular size is normal. No increase in  right ventricular wall thickness. Right ventricular systolic function is  normal. Tricuspid regurgitation signal is inadequate for assessing PA  pressure.   Left Atrium: Left atrial size was normal in size.   Right Atrium: Right atrial size was normal in size.   Pericardium: There is no evidence of  pericardial effusion. Presence of  pericardial fat pad.   Mitral Valve: The mitral valve is grossly normal. Mild mitral valve  regurgitation.   Tricuspid Valve: The tricuspid valve is grossly normal. Tricuspid valve  regurgitation is trivial.   Aortic Valve: The aortic valve is tricuspid. There is mild aortic valve  annular calcification. Aortic valve regurgitation is not visualized.   Pulmonic Valve: The pulmonic valve was grossly normal. Pulmonic valve  regurgitation is trivial.   Aorta: The aortic root is normal in size and structure.   Venous: The inferior vena cava is normal in size with greater than 50%  respiratory variability, suggesting right atrial pressure of 3 mmHg.   IAS/Shunts: No atrial level shunt detected by color flow Doppler.        08/2012 cath Hemodynamics: AO:  164/82   mmHg LV:  169/14    mmHg LVEDP: 24  mmHg   Coronary angiography: Coronary dominance: Right     Left Main:  Normal  Left Anterior Descending (LAD):  Normal in size with no significant disease.  1st diagonal (D1):  Small in size with minor irregularities.  2nd diagonal (D2):  Normal in size with no significant disease.  3rd diagonal (D3):  Normal in size with no significant disease.  Circumflex (LCx):  Normal in size and nondominant. The vessel has no significant disease.  1st obtuse marginal:  Small in size with no significant disease.  2nd obtuse marginal:  Small in size with no significant disease.  3rd obtuse marginal:  Large in size with no significant disease.    Right Coronary Artery: Normal in size and dominant. The vessel has no significant disease.  posterior descending artery: Normal in size with no significant disease.  posterior lateral branchs:  Normal in size with no significant disease.   Left ventriculography: Left ventricular systolic function is normal , LVEF is estimated at 60-65% %, there is no significant mitral regurgitation    Final Conclusions:     1. Normal coronary arteries. 2. Normal LV systolic function. 3. Moderately elevated left ventricular end-diastolic pressure likely due to diastolic heart failure.     06/2013 echo Study Conclusions  - Procedure narrative: Transthoracic echocardiography. Image   quality was suboptimal. The study was technically   difficult, as a result of poor sound wave transmission and   restricted patient mobility. - Left  ventricle: The cavity size was normal. Wall thickness   was increased in a pattern of mild to moderate LVH.   Diastolic dysfunction noted, grade indeterminant. Systolic   function was normal. The estimated ejection fraction was   in the range of 55% to 60%. Wall motion was normal; there   were no regional wall motion abnormalities.     Jan 2017 Event monitor: no arrhythmias   06/2014 CPX Conclusion: Exercise testing with gas exchange demonstrates a mild-moderately reduced functional capacity when compared to matched sedentary norms. There was an in-adequate HR response to the exercise response and flat O2 pulse. This high HR reserve was likely high due to pulmonary limitations based on restrictive spirometry and RR 45 at peak exercise and reaching maximum ventilatory limits. However, it could have easily been due to poor effort. The sub-maximal effort is clouding interpretation of the test. Nevertheless, correlation of resting spirometry test  Showing restriction with outpatient pulmonary function test is warranted     05/2016 heart monitor  Telemetry tracings predominately show sinus rhythm  Reported symptoms correlate with occasional PVCs and PACs       02/2016 echo Study Conclusions   - Left ventricle: The cavity size was normal. Systolic function was   vigorous. The estimated ejection fraction was in the range of 65%   to 70%. Wall motion was normal; there were no regional wall   motion abnormalities. There was an increased relative   contribution of atrial  contraction to ventricular filling.   Doppler parameters are consistent with abnormal left ventricular   relaxation (grade 1 diastolic dysfunction). - Pulmonic valve: There was trivial regurgitation. - Pulmonary arteries: Systolic pressure could not be accurately   estimated.       Labs/Other Tests and Data Reviewed:    EKG:  No ECG reviewed.  Recent Labs: 05/01/2020: ALT 29; BUN 19; Creatinine, Ser 1.00; Hemoglobin 12.4; Platelets 246; Potassium 3.5; Sodium 136; TSH 2.355   Recent Lipid Panel Lab Results  Component Value Date/Time   CHOL 157 05/01/2020 09:43 AM   TRIG 127 05/01/2020 09:43 AM   HDL 43 05/01/2020 09:43 AM   CHOLHDL 3.7 05/01/2020 09:43 AM   LDLCALC 89 05/01/2020 09:43 AM    Wt Readings from Last 3 Encounters:  05/25/20 220 lb (99.8 kg)  05/18/20 220 lb (99.8 kg)  04/23/20 225 lb 12.8 oz (102.4 kg)     Risk Assessment/Calculations:      Objective:    Vital Signs:  BP 127/75   Ht 5\' 5"  (1.651 m)   Wt 220 lb (99.8 kg)   BMI 36.61 kg/m    VITAL SIGNS:  reviewed  ASSESSMENT & PLAN:    Hypertension. I increased losartan 100 mg daily and increased chlorthalidone to 50 mg daily.echo 10/2021normal LVEF 65-70% with mild LVH. BP doing much better. Continue with same meds   Palpitations on metoprolol and diltiazem-has palpitations  when she was aggravated. Can take extra diltiazem as needed-no palpitations recently   Chronic diastolic CHF taking more lasix recently for leg swelling. Watching diet closer.  2D echo 02/05/2020 normal LVEF 65 to 70% with mild LVH and diastolic parameters were indeterminate   History of chest pain with normal cardiac catheter in 2014-no recent chest pain           COVID-19 Education: The signs and symptoms of COVID-19 were discussed with the patient and how to seek care for testing (follow up with PCP or arrange E-visit).   The importance of  social distancing was discussed today.  Time:   Today, I have spent 11 minutes  with the patient and chart prep with telehealth technology discussing the above problems.     Medication Adjustments/Labs and Tests Ordered: Current medicines are reviewed at length with the patient today.  Concerns regarding medicines are outlined above.   Tests Ordered: No orders of the defined types were placed in this encounter.   Medication Changes: No orders of the defined types were placed in this encounter.   Follow Up:  In Person in 6 month(s) Dr. Harl Bowie  Signed, Ermalinda Barrios, PA-C  05/25/2020 1:41 PM    [place]

## 2020-05-17 ENCOUNTER — Encounter (HOSPITAL_COMMUNITY): Payer: Self-pay

## 2020-05-18 ENCOUNTER — Ambulatory Visit: Payer: HMO

## 2020-05-18 ENCOUNTER — Other Ambulatory Visit: Payer: Self-pay

## 2020-05-18 ENCOUNTER — Encounter: Payer: Self-pay | Admitting: Orthopedic Surgery

## 2020-05-18 ENCOUNTER — Ambulatory Visit (INDEPENDENT_AMBULATORY_CARE_PROVIDER_SITE_OTHER): Payer: HMO | Admitting: Orthopedic Surgery

## 2020-05-18 VITALS — BP 136/67 | HR 66 | Ht 65.0 in | Wt 220.0 lb

## 2020-05-18 DIAGNOSIS — M25512 Pain in left shoulder: Secondary | ICD-10-CM | POA: Diagnosis not present

## 2020-05-18 DIAGNOSIS — G8929 Other chronic pain: Secondary | ICD-10-CM

## 2020-05-18 NOTE — Progress Notes (Signed)
Patient ID: Hannah Cooper, female   DOB: 08-22-1955, 65 y.o.   MRN: [id number]  ASSESSMENT AND PLAN:  65 year old female with plasmacytoma at radiation therapy presents with weakness and pain in her left shoulder, no trauma.  MRIs ordered to evaluate rotator cuff for rotator cuff tear    Chief Complaint  Patient presents with  . Shoulder Pain    L/7 on pain scale now /its been hurting for about a year.      HPI Hannah Cooper is a 65 y.o. female.  Left shoulder evaluation  65 year old female with history of diabetes, chronic congestive heart failure COPD cervical degenerative disc disease hypertension recent treatment for plasmacytoma comes in with history of atraumatic pain in the left shoulder started a year ago worsened in the last month.  Now the patient cannot abduct or flex her arm and has severe pain reaching up or out.  She has been on some ibuprofen hydrocodone with varying modes of relief     Treatment:  Nsaids YES  PT NO  Injection NO   Review of Systems Review of Systems  Constitutional: Negative for chills, fever and malaise/fatigue.  Musculoskeletal: Positive for back pain and joint pain.  Neurological: Negative for tingling.    Past Medical History:  Diagnosis Date  . Arthritis   . Atypical chest pain    chronic  . Cancer ([place])    bone cancer  . Chronic back pain   . Chronic diastolic CHF (congestive heart failure) ([place])   . COPD (chronic obstructive pulmonary disease) ([place])   . DDD (degenerative disc disease), cervical   . Hyperlipidemia   . Hypertension   . Lumbar radiculopathy   . Meningitis   . Normal coronary arteries 2014  . Palpitations   . Premature atrial contractions   . PVC's (premature ventricular contractions)     Past Surgical History:  Procedure Laterality Date  . ABDOMINAL HYSTERECTOMY    . IR FLUORO GUIDED NEEDLE PLC ASPIRATION/INJECTION LOC  06/19/2019  . LEFT HEART CATHETERIZATION WITH CORONARY ANGIOGRAM N/A  09/05/2012   Procedure: LEFT HEART CATHETERIZATION WITH CORONARY ANGIOGRAM;  Surgeon: Wellington Hampshire, MD;  Location: [facility];  Service: Cardiovascular;  Laterality: N/A;  . THYROID SURGERY  2002    Family History  Problem Relation Age of Onset  . Heart attack Mother 56  . Heart attack Sister 87     Social History   Tobacco Use  . Smoking status: Never Smoker  . Smokeless tobacco: Never Used  Vaping Use  . Vaping Use: Never used  Substance Use Topics  . Alcohol use: No    Alcohol/week: 0.0 standard drinks  . Drug use: No    Allergies  Allergen Reactions  . Other Swelling    [place] lipstick    @ALL @  Current Meds  Medication Sig  . acetaminophen (TYLENOL) 500 MG tablet Take 1,000 mg by mouth every 6 (six) hours as needed for headache (pain).   . Acetylcysteine, Nutrient, (N-ACETYL CYSTEINE) 600 MG TABS Take 1 tablet by mouth daily.  Marland Kitchen albuterol (PROVENTIL) (2.5 MG/3ML) 0.083% nebulizer solution Take 2.5 mg by nebulization every 6 (six) hours as needed for wheezing or shortness of breath.   Marland Kitchen albuterol (VENTOLIN HFA) 108 (90 Base) MCG/ACT inhaler Inhale 1-2 puffs into the lungs every 4 (four) hours as needed for wheezing or shortness of breath.  . chlorthalidone (HYGROTON) 50 MG tablet Take 1 tablet (50 mg total) by mouth daily.  . cycloSPORINE (RESTASIS)  0.05 % ophthalmic emulsion Place 1 drop into both eyes 2 (two) times daily.  . diclofenac Sodium (VOLTAREN) 1 % GEL Apply 2 g topically 4 (four) times daily.  Marland Kitchen diltiazem (CARDIZEM) 30 MG tablet Take 1 tablet (30 mg total) by mouth 2 (two) times daily.  . furosemide (LASIX) 20 MG tablet Take 1 tablet daily as needed may take 2 tablets prn  . gabapentin (NEURONTIN) 100 MG capsule Take 1 capsule (100 mg total) by mouth 3 (three) times daily as needed.  Marland Kitchen glipiZIDE-metformin (METAGLIP) 5-500 MG tablet Take 1 tablet by mouth in the morning and at bedtime.  Marland Kitchen HYDROcodone-acetaminophen (NORCO) 10-325 MG tablet Take 1 tablet  by mouth 2 (two) times daily as needed for moderate pain (pain.).  Marland Kitchen ibuprofen (ADVIL,MOTRIN) 800 MG tablet Take 1 tablet by mouth 2 (two) times daily as needed (back pain).   . Insulin Glargine (BASAGLAR KWIKPEN) 100 UNIT/ML SMARTSIG:15 Unit(s) SUB-Q Every Night  . isosorbide mononitrate (IMDUR) 30 MG 24 hr tablet TAKE 1 TABLET (30 MG TOTAL) BY MOUTH DAILY.  Marland Kitchen lidocaine (LIDODERM) 5 % Place 2 patches onto the skin daily. Remove & Discard patch within 12 hours or as directed by MD  . losartan (COZAAR) 100 MG tablet Take 100 mg by mouth daily.  . metoprolol tartrate (LOPRESSOR) 100 MG tablet Take 100 mg by mouth 2 (two) times daily.  . mupirocin ointment (BACTROBAN) 2 % Apply 1 application topically 3 (three) times daily as needed.  Marland Kitchen RELION PEN NEEDLES 31G X 6 MM MISC USE 1 PEN NEEDLE TO INJECT INSULIN AT BEDTIME  . valACYclovir (VALTREX) 1000 MG tablet Take 1,000 mg by mouth 3 (three) times daily.       Physical Exam BP 136/67   Pulse 66   Ht 5\' 5"  (1.651 m)   Wt 220 lb (99.8 kg)   BMI 36.61 kg/m   Ambulatory status normal with no assistive devices  GENERAL : APPEARANCE IS NORMAL GROOMING IS GOOD  NORMAL MOOD AND AFFECT  AWAKE ALERT AND ORIENTED X 3   Left SHOULDER  TENDERNESS mild ROM active range of motion: Abduction 45, flexion 40 passive: External rotation 45 painful and abduction and flexion painful in the same arc STABLE ANTERIORLY POSTERIORLY AND INFERIORLY SKIN CLEAN     PROVOCATIVE TESTS  DROP ARM TEST not performed PAINFUL ARC 0-40 EMPTY CAN -JOBST TEST could not perform EXTERNAL ROTATION LAG TEST could not perform LIFT OFF TEST normal BELLYPRESS TEST normal  ON THE OTHER SIDE THE RIGHT SHOULDER HAS NORMAL SKIN, NO ROM DEFICITS, EXCELLENT STABILITY, AND NORMAL 5/5 MMT STRENGTH   MEDICAL DECISION MAKING  A.  Encounter Diagnosis  Name Primary?  . Chronic left shoulder pain Yes    B. DATA ANALYSED:    IMAGING: Independent interpretation of images:  X-rays done in the office see my report chronic rotator cuff disease bone related changes with greater tuberosity sclerosis break in [place]'s line also acromioclavicular arthritis  Orders: None  Outside records reviewed: Notes from Dr. Raliegh Ip cancer specialist, indicating the plasmacytoma  C. MANAGEMENT   MRI to evaluate rotator cuff for tear    No orders of the defined types were placed in this encounter.

## 2020-05-25 ENCOUNTER — Other Ambulatory Visit: Payer: Self-pay

## 2020-05-25 ENCOUNTER — Telehealth (INDEPENDENT_AMBULATORY_CARE_PROVIDER_SITE_OTHER): Payer: HMO | Admitting: Physician Assistant

## 2020-05-25 VITALS — BP 127/75 | Ht 65.0 in | Wt 220.0 lb

## 2020-05-25 DIAGNOSIS — I10 Essential (primary) hypertension: Secondary | ICD-10-CM

## 2020-05-25 DIAGNOSIS — I5032 Chronic diastolic (congestive) heart failure: Secondary | ICD-10-CM

## 2020-05-25 DIAGNOSIS — R002 Palpitations: Secondary | ICD-10-CM | POA: Diagnosis not present

## 2020-05-25 DIAGNOSIS — Z87898 Personal history of other specified conditions: Secondary | ICD-10-CM

## 2020-05-25 NOTE — Patient Instructions (Signed)

## 2020-06-04 ENCOUNTER — Other Ambulatory Visit: Payer: Self-pay

## 2020-06-04 ENCOUNTER — Ambulatory Visit (HOSPITAL_COMMUNITY)
Admission: RE | Admit: 2020-06-04 | Discharge: 2020-06-04 | Disposition: A | Discharge status: Home / Self Care | Payer: HMO | Source: Ambulatory Visit | Attending: Orthopedic Surgery | Admitting: Orthopedic Surgery

## 2020-06-04 ENCOUNTER — Encounter: Payer: HMO | Admitting: Physical Medicine and Rehabilitation

## 2020-06-04 DIAGNOSIS — G8929 Other chronic pain: Secondary | ICD-10-CM | POA: Diagnosis not present

## 2020-06-04 DIAGNOSIS — M25512 Pain in left shoulder: Secondary | ICD-10-CM | POA: Diagnosis not present

## 2020-06-04 DIAGNOSIS — M75122 Complete rotator cuff tear or rupture of left shoulder, not specified as traumatic: Secondary | ICD-10-CM | POA: Diagnosis not present

## 2020-06-08 ENCOUNTER — Other Ambulatory Visit: Payer: Self-pay

## 2020-06-08 ENCOUNTER — Ambulatory Visit (INDEPENDENT_AMBULATORY_CARE_PROVIDER_SITE_OTHER): Payer: HMO | Admitting: Orthopedic Surgery

## 2020-06-08 ENCOUNTER — Encounter: Payer: Self-pay | Admitting: Orthopedic Surgery

## 2020-06-08 VITALS — BP 147/96 | HR 76 | Ht 65.0 in | Wt 220.0 lb

## 2020-06-08 DIAGNOSIS — M75122 Complete rotator cuff tear or rupture of left shoulder, not specified as traumatic: Secondary | ICD-10-CM | POA: Diagnosis not present

## 2020-06-08 NOTE — Patient Instructions (Signed)
Rotator Cuff Tear  A rotator cuff tear is a partial or complete tear of the cord-like bands (tendons) that connect muscle to bone in the rotator cuff. The rotator cuff is a group of muscles and tendons that surround the shoulder joint and keep the upper arm bone (humerus) in the shoulder socket. The tear can occur suddenly (acute tear) or can develop over a long period of time (chronic tear). What are the causes? Acute tears may be caused by:  A fall, especially on an outstretched arm.  Lifting very heavy objects with a jerking motion. Chronic tears may be caused by overuse of the muscles. This may happen in sports, physical work, or activities in which your arm repeatedly moves over your head. What increases the risk? This condition is more likely to occur in:  Athletes and workers who frequently use their shoulder or reach over their head. This may include activities such as: ? Tennis. ? Baseball and softball. ? Swimming and rowing. ? Weight lifting. ? Architect work. ? Painting.  People who smoke.  Older people who have arthritis or poor blood supply. These can make the muscles and tendons weaker. What are the signs or symptoms? Symptoms of this condition depend on the type and severity of the injury:  An acute tear may include a sudden tearing feeling, followed by severe pain that goes from your upper shoulder, down your arm, and toward your elbow.  A chronic tear includes a gradual weakness and decreased shoulder motion as the pain gets worse. The pain is usually worse at night. Both types may have symptoms such as:  Pain that spreads (radiates) from the shoulder to the upper arm.  Swelling and tenderness in front of the shoulder.  Decreased range of motion.  Pain when: ? Reaching, pulling, or lifting the arm above the head. ? Lowering the arm from above the head.  Not being able to raise your arm out to the side.  Difficulty placing the arm behind your back. How  is this diagnosed? This condition is diagnosed with a medical history and physical exam. Imaging tests may also be done, including:  X-rays.  MRI.  Ultrasound.  CT or MR arthrogram. During this test, a contrast material is injected into your shoulder, and then images are taken. How is this treated? Treatment for this condition depends on the type and severity of the condition. In less severe cases, treatment may include:  Rest. This may be done with a sling that holds the shoulder still (immobilization). Your health care provider may also recommend avoiding activities that involve lifting your arm over your head.  Icing the shoulder.  Anti-inflammatory medicines, such as aspirin or ibuprofen.  Strengthening and stretching exercises. Your health care provider may recommend specific exercises to improve your range of motion and strengthen your shoulder. In more severe cases, treatment may include:  Physical therapy.  Steroid injections.  Surgery. Follow these instructions at home: Managing pain, stiffness, and swelling  If directed, put ice on the injured area. To do this: ? If you have a removable sling, remove it as told by your health care provider. ? Put ice in a plastic bag. ? Place a towel between your skin and the bag. ? Leave the ice on for 20 minutes, 2-3 times a day. ? Remove the ice if your skin turns bright red. This is very important. If you cannot feel pain, heat, or cold, you have a greater risk of damage to the area.  Raise (  elevate) the injured area above the level of your heart while you are lying down.  Find a comfortable sleeping position or sleep on a recliner, if available.  Move your fingers often to reduce stiffness and swelling.  Once the swelling has gone down, your health care provider may direct you to apply heat to relax the muscles. Use the heat source that your health care provider recommends, such as a moist heat pack or a heating pad. ? Place  a towel between your skin and the heat source. ? Leave the heat on for 20-30 minutes. ? Remove the heat if your skin turns bright red. This is especially important if you are unable to feel pain, heat, or cold. You have a greater risk of getting burned.      If you have a sling:  Wear the sling as told by your health care provider. Remove it only as told by your health care provider.  Loosen the sling if your fingers tingle, become numb, or turn cold and blue.  Keep the sling clean.  If the sling is not waterproof: ? Do not let it get wet. ? Cover it with a watertight covering when you take a bath or a shower. Activity  Rest your shoulder as told by your health care provider.  Return to your normal activities as told by your health care provider. Ask your health care provider what activities are safe for you.  Ask your health care provider when it is safe to drive if you have a sling on your arm.  Do any exercises or stretches as told by your health care provider. General instructions  Take over-the-counter and prescription medicines only as told by your health care provider.  Do not use any products that contain nicotine or tobacco, such as cigarettes, e-cigarettes, and chewing tobacco. If you need help quitting, ask your health care provider.  Keep all follow-up visits. This is important. Contact a health care provider if:  Your pain gets worse.  You have new pain in your arm, hands, or fingers.  Medicine does not help your pain. Get help right away if:  Your arm, hand, or fingers are numb or tingling.  Your arm, hand, or fingers are swollen or painful or they turn white or blue.  Your hand or fingers on your injured arm are colder than your other hand. Summary  A rotator cuff tear is a partial or complete tear of the cord-like bands (tendons) that connect muscle to bone in the rotator cuff.  The tear can occur suddenly (acute tear) or can develop over a long  period of time (chronic tear).  Treatment generally includes rest, anti-inflammatory medicines, and icing. In some cases, physical therapy and steroid injections may be needed. In severe cases, surgery may be needed. This information is not intended to replace advice given to you by your health care provider. Make sure you discuss any questions you have with your health care provider. Document Revised: 08/07/2019 Document Reviewed: 08/07/2019 Elsevier Patient Education  [place].  Surgery for Rotator Cuff Tear, Care After This sheet gives you information about how to care for yourself after your procedure. Your health care provider may also give you more specific instructions. If you have problems or questions, contact your health care provider. What can I expect after the procedure? After the procedure, it is common to have:  Redness.  Swelling.  A small amount of fluid or blood in the incision area.  Pain.  Stiffness. Follow these instructions at home: If you have a sling or a shoulder immobilizer:  Wear it as told by your health care provider. Remove it only as told by your health care provider.  Loosen it if your fingers tingle, become numb, or turn cold and blue.  Keep it clean and dry. Bathing  Do not take baths, swim, or use a hot tub until your health care provider approves. Ask your health care provider if you may take showers. You may only be allowed to take sponge baths.  Keep your bandage (dressing) dry until your health care provider says it can be removed.  If your sling or shoulder immobilizer is not waterproof: ? Do not let it get wet. ? Remove it when you take a bath or shower as told by your health care provider. Once the sling or shoulder immobilizer is removed, try not to move your shoulder until your health care provider says that you can. Incision care  Follow instructions from your health care provider about how to take care of your incision.  Make sure you: ? Wash your hands with soap and water for at least 20 seconds before and after you change your dressing. If soap and water are not available, use hand sanitizer. ? Change your dressing as told by your health care provider. ? Leave stitches (sutures), skin glue, or adhesive strips in place. These skin closures may need to stay in place for 2 weeks or longer. If adhesive strip edges start to loosen and curl up, you may trim the loose edges. Do not remove adhesive strips completely unless your health care provider tells you to do that.  Check your incision area every day for signs of infection. Check for: ? More redness, swelling, or pain. ? More fluid or blood. ? Warmth. ? Pus or a bad smell.   Managing pain, stiffness, and swelling  If directed, put ice on your shoulder area. To do this: ? Put ice in a plastic bag. ? Place a towel between your skin and the bag. ? Leave the ice on for 20 minutes, 2-3 times a day. ? Remove the ice if your skin turns bright red. This is very important. If you cannot feel pain, heat, or cold, you have a greater risk of damage to the area.  Move your fingers often to reduce stiffness and swelling.  Raise (elevate) your upper body on pillows when you lie down and when you sleep. ? Do not sleep on the front of your body (abdomen). ? Do not sleep on the side that your surgery was performed on.   Medicines  Take over-the-counter and prescription medicines only as told by your health care provider.  Ask your health care provider if the medicine prescribed to you: ? Requires you to avoid driving or using machinery. ? Can cause constipation. You may need to take these actions to prevent or treat constipation:  Drink enough fluid to keep your urine pale yellow.  Take over-the-counter or prescription medicines.  Eat foods that are high in fiber, such as beans, whole grains, and fresh fruits and vegetables.  Limit foods that are high in fat and  processed sugars, such as fried or sweet foods. Driving  If you were given a sedative during the procedure, it can affect you for several hours. Do not drive or operate machinery until your health care provider says that it is safe.  Do not drive while wearing a sling or a shoulder immobilizer. Ask  your health care provider when it is safe to drive. Activity  Do not use your arm to support your body weight until your health care provider says that you can.  Do not lift or hold anything with your arm until your health care provider approves.  Return to your normal activities as told by your health care provider. Ask your health care provider what activities are safe for you.  Do exercises as told by your health care provider. General instructions  Do not use any products that contain nicotine or tobacco, such as cigarettes, e-cigarettes, and chewing tobacco. These can delay healing after surgery. If you need help quitting, ask your health care provider.  Keep all follow-up visits. This is important. Contact a health care provider if:  You have a fever or chills.  You have more redness, swelling, or pain around your incision.  You have more fluid or blood coming from your incision.  Your incision feels warm to the touch.  You have pus or a bad smell coming from your incision.  You have pain that gets worse or does not get better with medicine. Get help right away if:  You have severe pain.  You lose feeling in your arm or hand.  Your hand or fingers turn very pale or blue. Summary  If you have a sling or shoulder immobilizer, wear it as told by your health care provider. Remove it only as told by your health care provider.  Change your dressing as told by your health care provider. Check the incision area every day for signs of infection.  If directed, put ice on your shoulder area 2-3 times a day.  Do not use your arm to lift anything or to support your body weight  until your health care provider says that you can. This information is not intended to replace advice given to you by your health care provider. Make sure you discuss any questions you have with your health care provider. Document Revised: 08/07/2019 Document Reviewed: 08/07/2019 Elsevier Patient Education  2021 [employer].

## 2020-06-08 NOTE — Addendum Note (Signed)
Addended by: Carole Civil on: 06/08/2020 09:39 AM   Modules accepted: Level of Service

## 2020-06-08 NOTE — Progress Notes (Addendum)
Chief Complaint  Patient presents with  . Shoulder Pain    left  . Results    Review MRI     64 year old female with severe left shoulder pain she had an MRI which shows a rotator cuff tear.  After discussion she does not want to proceed with any other nonoperative treatment as she is having severe night pain.  She takes some hydrocodone for lower back pain which is chronic    I read the MRI she has a 1 cm tear of the rotator cuff some arthritis at the [facility] joint and glenohumeral joint questionable labral tear appears to be more invagination of the tissue than actual tear  Discussed this with her at length she would like to proceed with a cuff repair  She is noted to have shortness of breath on exertion from her congestive heart failure she is also diabetic we discussed these as possible risk factors for failure of the surgery   However she wishes to proceed we will get preop clearance from cardiology  She will come back for preop  Summary of the report IMPRESSION: 1. Full-thickness partial width tear of the anterior supraspinatus tendon distally, with 1.1 cm retraction. 2. Moderate supraspinatus tendinopathy with mild infraspinatus and subscapularis tendinopathy. 3. Moderate degenerative chondral thinning in the glenohumeral joint. Moderate degenerative AC joint arthropathy with unfavorable subacromial morphology. 4. Potential tear of the posterior labrum, although a similar appearance can be caused by and invagination of the capsule adjacent to the labrum simulating a tear.     Electronically Signed   By: Van Clines M.D.   On: 06/05/2020 09:40   (Chronic illness with progression, independent interpretation of test which was the MRI and identifiable risk factors diabetes and hypertension and congestive heart failure) level 5

## 2020-06-11 ENCOUNTER — Telehealth: Payer: Self-pay | Admitting: Cardiology

## 2020-06-11 NOTE — Telephone Encounter (Signed)
   [facility] Pre-operative Risk Assessment    HEARTCARE STAFF: - Please ensure there is not already an duplicate clearance open for this procedure. - Under Visit Info/Reason for Call, type in Other and utilize the format Clearance MM/DD/YY or Clearance TBD. Do not use dashes or single digits. - If request is for dental extraction, please clarify the # of teeth to be extracted.  Request for surgical clearance:  1. What type of surgery is being performed? Shoulder Surgery  2. When is this surgery scheduled? TBD near future  3. What type of clearance is required (medical clearance vs. Pharmacy clearance to hold med vs. Both)? both  4. Are there any medications that need to be held prior to surgery and how long? Please advise if patient needs to hold any medications  5. Practice name and name of physician performing surgery? [facility]  6. What is the office phone number? [phone]   7.   What is the office fax number?[phone]  8.   Anesthesia type (None, local, MAC, general) ? general   Jannet Askew 06/11/2020, 11:01 AM  _________________________________________________________________   (provider comments below)

## 2020-06-11 NOTE — Telephone Encounter (Signed)
Dr. Harl Bowie to review, Mrs. Langworthy is a pleasant 65 year old female with past medical history of COPD, atypical chest pain with normal coronaries on cath 09/05/2012, hypertension and palpitation.  Cardiopulmonary stress test in March 2014 showed mild to moderate reduced functional capacity.  Recent echocardiogram obtained in October 2021 showed normal EF 65 to 70%, mild LVH.  She was most recently seen by Estella Husk PA-C on 05/25/2020.  Talking with the patient, she denies any recent chest pain or worsening dyspnea, however she is unable to accomplish more than 4 METS of activity as she is quite sedentary and gets short of breath with any strenuous activity.  Patient is wondering if she needed additional work-up in this case however wished to defer the decision to her primary cardiologist.  We will forward to Dr. Harl Bowie to review, please forward your response to P CV DIV PREOP

## 2020-06-16 ENCOUNTER — Telehealth: Payer: Self-pay | Admitting: Orthopedic Surgery

## 2020-06-16 NOTE — Telephone Encounter (Signed)
New message     Patient called needs to have clearance letter from Dr Harl Bowie sent to [facility] so that they can schedule her shoulder surgery

## 2020-06-16 NOTE — Telephone Encounter (Signed)
Call received patient - following up on status of clearance for surgery. Please advise.

## 2020-06-16 NOTE — Telephone Encounter (Signed)
Have not received clearance yet, was forwarded to Dr Harl Bowie but he has not advised  After clearance Dr Aline Brochure states She will come back for preop  I have resent letter to Dr Harl Bowie and told her will do this.

## 2020-06-17 ENCOUNTER — Other Ambulatory Visit: Payer: Self-pay

## 2020-06-17 DIAGNOSIS — Z01818 Encounter for other preprocedural examination: Secondary | ICD-10-CM

## 2020-06-17 NOTE — Telephone Encounter (Signed)
Orders placed for Lexiscan. Patient was contacted and she verbalized agreement. Went over instructions with her and she understood.

## 2020-06-17 NOTE — Telephone Encounter (Signed)
Would recommend a lexiscan to further risk stratify. Hannah Cooper can we order a lexiscan for dyspnea, preopertive risk evaluation   Hannah Abts MD

## 2020-06-17 NOTE — Progress Notes (Signed)
Lexiscan ordered per Dr. Harl Bowie.

## 2020-06-18 ENCOUNTER — Other Ambulatory Visit (HOSPITAL_COMMUNITY): Payer: Self-pay

## 2020-06-18 DIAGNOSIS — C9030 Solitary plasmacytoma not having achieved remission: Secondary | ICD-10-CM

## 2020-06-18 MED ORDER — HYDROCODONE-ACETAMINOPHEN 10-325 MG PO TABS: 1.0000 | ORAL_TABLET | Freq: Two times a day (BID) | ORAL | 0 refills | Status: DC | PRN

## 2020-06-22 ENCOUNTER — Encounter (HOSPITAL_BASED_OUTPATIENT_CLINIC_OR_DEPARTMENT_OTHER)
Admission: RE | Admit: 2020-06-22 | Discharge: 2020-06-22 | Disposition: A | Discharge status: Home / Self Care | Payer: HMO | Source: Ambulatory Visit | Attending: Cardiology | Admitting: Cardiology

## 2020-06-22 ENCOUNTER — Telehealth: Payer: Self-pay | Admitting: Cardiology

## 2020-06-22 ENCOUNTER — Other Ambulatory Visit: Payer: Self-pay

## 2020-06-22 ENCOUNTER — Ambulatory Visit (HOSPITAL_COMMUNITY)
Admission: RE | Admit: 2020-06-22 | Discharge: 2020-06-22 | Disposition: A | Discharge status: Home / Self Care | Payer: HMO | Source: Ambulatory Visit | Attending: Cardiology | Admitting: Cardiology

## 2020-06-22 DIAGNOSIS — Z01818 Encounter for other preprocedural examination: Secondary | ICD-10-CM | POA: Insufficient documentation

## 2020-06-22 DIAGNOSIS — R0600 Dyspnea, unspecified: Secondary | ICD-10-CM | POA: Insufficient documentation

## 2020-06-22 DIAGNOSIS — Z01810 Encounter for preprocedural cardiovascular examination: Secondary | ICD-10-CM | POA: Diagnosis not present

## 2020-06-22 LAB — NM MYOCAR MULTI W/SPECT W/WALL MOTION / EF
LV dias vol: 72 mL (ref 46–106)
LV sys vol: 18 mL
Peak HR: 82 {beats}/min
RATE: 0.58
Rest HR: 48 {beats}/min
SDS: 3
SRS: 0
SSS: 3
TID: 1.18

## 2020-06-22 MED ORDER — SODIUM CHLORIDE FLUSH 0.9 % IV SOLN
INTRAVENOUS | Status: AC
  Filled 2020-06-22: qty 40

## 2020-06-22 MED ORDER — REGADENOSON 0.4 MG/5ML IV SOLN
INTRAVENOUS | Status: AC
  Administered 2020-06-22: 0.4 mg via INTRAVENOUS
  Filled 2020-06-22: qty 5

## 2020-06-22 MED ORDER — SODIUM CHLORIDE FLUSH 0.9 % IV SOLN
INTRAVENOUS | Status: AC
  Filled 2020-06-22: qty 10

## 2020-06-22 MED ORDER — TECHNETIUM TC 99M TETROFOSMIN IV KIT
10.0000 | PACK | Freq: Once | INTRAVENOUS | Status: AC | PRN
  Administered 2020-06-22: 10.5 via INTRAVENOUS

## 2020-06-22 MED ORDER — TECHNETIUM TC 99M TETROFOSMIN IV KIT
30.0000 | PACK | Freq: Once | INTRAVENOUS | Status: AC | PRN
  Administered 2020-06-22: 31.4 via INTRAVENOUS

## 2020-06-23 ENCOUNTER — Telehealth: Payer: Self-pay | Admitting: *Deleted

## 2020-06-23 NOTE — Telephone Encounter (Signed)
Ok to proceed with surgery   Zandra Abts MD

## 2020-06-23 NOTE — Telephone Encounter (Signed)
Dr. Toniann Ket was completed yesterday for preop clearance. Results mentioned partially reversible defect, but also mentions soft tissue attenuation. Based on these results, is the patient cleared for surgery?

## 2020-06-23 NOTE — Telephone Encounter (Signed)
Pt calling to get results from stress test.   Overall stress test looks fine, ok to proceed with surgery from cardiac standpoint  Hannah Abts MD   Pt notified of stress test results and voiced understanding

## 2020-06-24 ENCOUNTER — Telehealth: Payer: Self-pay | Admitting: Radiology

## 2020-06-24 NOTE — Telephone Encounter (Signed)
What do you want to do  RCR  open or mini open or arthroscopic ?  Any special needs?

## 2020-06-24 NOTE — Telephone Encounter (Signed)
-----   Message from Carole Civil, MD sent at 06/24/2020 10:03 AM EST ----- Cardiology cleared   Ok to schedule

## 2020-06-24 NOTE — Telephone Encounter (Signed)
Tried to call her, no answer Sent her a Pharmacist, community message

## 2020-06-24 NOTE — Telephone Encounter (Signed)
   Primary Cardiologist: Carlyle Dolly, MD  Chart reviewed as part of pre-operative protocol coverage. She was unable to complete 4.0 METS and was asked to complete a nuclear stress test prior to clearance. NST was low risk. Per Dr. Harl Bowie, patient may proceed with surgery.   Therefore, based on ACC/AHA guidelines, the patient would be at acceptable risk for the planned procedure without further cardiovascular testing.   I will route this recommendation to the requesting party via Epic fax function and remove from pre-op pool. Please call with questions.  [place], PA 06/24/2020, 7:49 AM

## 2020-06-24 NOTE — Telephone Encounter (Signed)
Open   Arthrex suture bridge

## 2020-06-25 ENCOUNTER — Other Ambulatory Visit: Payer: Self-pay | Admitting: Orthopedic Surgery

## 2020-06-25 ENCOUNTER — Encounter: Payer: Self-pay | Admitting: *Deleted

## 2020-06-25 NOTE — Telephone Encounter (Signed)
I called her and scheduled.

## 2020-06-25 NOTE — Telephone Encounter (Signed)
Patient called, she received your mychart message.  She is asking that you call her to setup a surgery date.  I told her you all are in clinic all day today and it may be tomorrow, but you would call her to discuss.

## 2020-06-29 ENCOUNTER — Other Ambulatory Visit: Payer: Self-pay | Admitting: Orthopedic Surgery

## 2020-06-29 ENCOUNTER — Encounter: Payer: Self-pay | Admitting: Orthopedic Surgery

## 2020-06-29 ENCOUNTER — Ambulatory Visit: Payer: Self-pay | Admitting: Radiation Oncology

## 2020-06-29 ENCOUNTER — Ambulatory Visit (INDEPENDENT_AMBULATORY_CARE_PROVIDER_SITE_OTHER): Payer: No Typology Code available for payment source | Admitting: Orthopedic Surgery

## 2020-06-29 ENCOUNTER — Other Ambulatory Visit: Payer: Self-pay

## 2020-06-29 VITALS — BP 134/77 | HR 66 | Ht 65.0 in | Wt 220.0 lb

## 2020-06-29 DIAGNOSIS — M75122 Complete rotator cuff tear or rupture of left shoulder, not specified as traumatic: Secondary | ICD-10-CM | POA: Diagnosis not present

## 2020-06-29 DIAGNOSIS — G8918 Other acute postprocedural pain: Secondary | ICD-10-CM

## 2020-06-29 MED ORDER — IBUPROFEN 800 MG PO TABS: 800.0000 mg | ORAL_TABLET | Freq: Three times a day (TID) | ORAL | 1 refills | Status: DC | PRN

## 2020-06-29 MED ORDER — TIZANIDINE HCL 4 MG PO TABS: 4.0000 mg | ORAL_TABLET | Freq: Every day | ORAL | 1 refills | Status: DC

## 2020-06-29 MED ORDER — OXYCODONE-ACETAMINOPHEN 7.5-325 MG PO TABS
1.0000 | ORAL_TABLET | ORAL | 0 refills | Status: DC | PRN
Start: 2020-06-29 — End: 2020-07-15

## 2020-06-29 NOTE — Progress Notes (Signed)
Chief Complaint  Patient presents with  . Shoulder Pain    Left/ scheduled for surgery on Friday discuss surgery  Open RCR     65 year old female with COPD CHF plasmacytoma and current treatment has a left rotator cuff tear.  Preop counseling for open left rotator cuff repair  She is concerned about a block because she is afraid of needles I told her she will get some sedating medication  I discussed the surgery treatment postop course  We will use a Cryo/Cuff for her postoperatively  We will send her medications in.  She is allergic to gabapentin, she had some nausea with that  Meds ordered this encounter  Medications  . oxyCODONE-acetaminophen (PERCOCET) 7.5-325 MG tablet    Sig: Take 1 tablet by mouth every 4 (four) hours as needed for severe pain. Patient having surgery Friday    Dispense:  42 tablet    Refill:  0  . ibuprofen (ADVIL) 800 MG tablet    Sig: Take 1 tablet (800 mg total) by mouth every 8 (eight) hours as needed.    Dispense:  90 tablet    Refill:  1  . tiZANidine (ZANAFLEX) 4 MG tablet    Sig: Take 1 tablet (4 mg total) by mouth daily.    Dispense:  30 tablet    Refill:  1

## 2020-06-30 NOTE — Patient Instructions (Signed)
Your procedure is scheduled on: 07/03/2020  Report to [facility] at   7:05  AM.  Call this number if you have problems the morning of surgery: [phone]   Remember:   Do not Eat or Drink after midnight                No Smoking the morning of surgery              Take only 1/2 dose of Basaglar insulin 7.5 units the night before your surgery              No diabetic medication am of surgery  :  Take these medicines the morning of surgery with A SIP OF WATER: Diltiazem, Isosorbide, and metoprolol              (gabapentin, oxycodone, and/or zanaflex if needed)   Do not wear jewelry, make-up or nail polish.  Do not wear lotions, powders, or perfumes. You may wear deodorant.  Do not shave 48 hours prior to surgery. Men may shave face and neck.  Do not bring valuables to the hospital.  Contacts, dentures or bridgework may not be worn into surgery.  Leave suitcase in the car. After surgery it may be brought to your room.  For patients admitted to the hospital, checkout time is 11:00 AM the day of discharge.   Patients discharged the day of surgery will not be allowed to drive home.    Special Instructions: Shower using CHG night before surgery and shower the day of surgery use CHG.  Use special wash - you have one bottle of CHG for all showers.  You should use approximately 1/2 of the bottle for each shower.  How to Use Chlorhexidine for Bathing Chlorhexidine gluconate (CHG) is a germ-killing (antiseptic) solution that is used to clean the skin. It can get rid of the bacteria that normally live on the skin and can keep them away for about 24 hours. To clean your skin with CHG, you may be given:  A CHG solution to use in the shower or as part of a sponge bath.  A prepackaged cloth that contains CHG. Cleaning your skin with CHG may help lower the risk for infection:  While you are staying in the intensive care unit of the hospital.  If you have a vascular access, such as a central  line, to provide short-term or long-term access to your veins.  If you have a catheter to drain urine from your bladder.  If you are on a ventilator. A ventilator is a machine that helps you breathe by moving air in and out of your lungs.  After surgery. What are the risks? Risks of using CHG include:  A skin reaction.  Hearing loss, if CHG gets in your ears.  Eye injury, if CHG gets in your eyes and is not rinsed out.  The CHG product catching fire. Make sure that you avoid smoking and flames after applying CHG to your skin. Do not use CHG:  If you have a chlorhexidine allergy or have previously reacted to chlorhexidine.  On babies younger than 26 months of age. How to use CHG solution  Use CHG only as told by your health care provider, and follow the instructions on the label.  Use the full amount of CHG as directed. Usually, this is one bottle. During a shower Follow these steps when using CHG solution during a shower (unless your health care provider gives you different instructions):  1. Start the shower. 2. Use your normal soap and shampoo to wash your face and hair. 3. Turn off the shower or move out of the shower stream. 4. Pour the CHG onto a clean washcloth. Do not use any type of brush or rough-edged sponge. 5. Starting at your neck, lather your body down to your toes. Make sure you follow these instructions: ? If you will be having surgery, pay special attention to the part of your body where you will be having surgery. Scrub this area for at least 1 minute. ? Do not use CHG on your head or face. If the solution gets into your ears or eyes, rinse them well with water. ? Avoid your genital area. ? Avoid any areas of skin that have broken skin, cuts, or scrapes. ? Scrub your back and under your arms. Make sure to wash skin folds. 6. Let the lather sit on your skin for 1-2 minutes or as long as told by your health care provider. 7. Thoroughly rinse your entire body in  the shower. Make sure that all body creases and crevices are rinsed well. 8. Dry off with a clean towel. Do not put any substances on your body afterward--such as powder, lotion, or perfume--unless you are told to do so by your health care provider. Only use lotions that are recommended by the manufacturer. 9. Put on clean clothes or pajamas. 10. If it is the night before your surgery, sleep in clean sheets.   During a sponge bath Follow these steps when using CHG solution during a sponge bath (unless your health care provider gives you different instructions): 1. Use your normal soap and shampoo to wash your face and hair. 2. Pour the CHG onto a clean washcloth. 3. Starting at your neck, lather your body down to your toes. Make sure you follow these instructions: ? If you will be having surgery, pay special attention to the part of your body where you will be having surgery. Scrub this area for at least 1 minute. ? Do not use CHG on your head or face. If the solution gets into your ears or eyes, rinse them well with water. ? Avoid your genital area. ? Avoid any areas of skin that have broken skin, cuts, or scrapes. ? Scrub your back and under your arms. Make sure to wash skin folds. 4. Let the lather sit on your skin for 1-2 minutes or as long as told by your health care provider. 5. Using a different clean, wet washcloth, thoroughly rinse your entire body. Make sure that all body creases and crevices are rinsed well. 6. Dry off with a clean towel. Do not put any substances on your body afterward--such as powder, lotion, or perfume--unless you are told to do so by your health care provider. Only use lotions that are recommended by the manufacturer. 7. Put on clean clothes or pajamas. 8. If it is the night before your surgery, sleep in clean sheets. How to use CHG prepackaged cloths  Only use CHG cloths as told by your health care provider, and follow the instructions on the label.  Use the  CHG cloth on clean, dry skin.  Do not use the CHG cloth on your head or face unless your health care provider tells you to.  When washing with the CHG cloth: ? Avoid your genital area. ? Avoid any areas of skin that have broken skin, cuts, or scrapes. Before surgery Follow these steps when using a CHG cloth  to clean before surgery (unless your health care provider gives you different instructions): 1. Using the CHG cloth, vigorously scrub the part of your body where you will be having surgery. Scrub using a back-and-forth motion for 3 minutes. The area on your body should be completely wet with CHG when you are done scrubbing. 2. Do not rinse. Discard the cloth and let the area air-dry. Do not put any substances on the area afterward, such as powder, lotion, or perfume. 3. Put on clean clothes or pajamas. 4. If it is the night before your surgery, sleep in clean sheets.   For general bathing Follow these steps when using CHG cloths for general bathing (unless your health care provider gives you different instructions). 1. Use a separate CHG cloth for each area of your body. Make sure you wash between any folds of skin and between your fingers and toes. Wash your body in the following order, switching to a new cloth after each step: ? The front of your neck, shoulders, and chest. ? Both of your arms, under your arms, and your hands. ? Your stomach and groin area, avoiding the genitals. ? Your right leg and foot. ? Your left leg and foot. ? The back of your neck, your back, and your buttocks. 2. Do not rinse. Discard the cloth and let the area air-dry. Do not put any substances on your body afterward--such as powder, lotion, or perfume--unless you are told to do so by your health care provider. Only use lotions that are recommended by the manufacturer. 3. Put on clean clothes or pajamas. Contact a health care provider if:  Your skin gets irritated after scrubbing.  You have questions about  using your solution or cloth. Get help right away if:  Your eyes become very red or swollen.  Your eyes itch badly.  Your skin itches badly and is red or swollen.  Your hearing changes.  You have trouble seeing.  You have swelling or tingling in your mouth or throat.  You have trouble breathing.  You swallow any chlorhexidine. Summary  Chlorhexidine gluconate (CHG) is a germ-killing (antiseptic) solution that is used to clean the skin. Cleaning your skin with CHG may help to lower your risk for infection.  You may be given CHG to use for bathing. It may be in a bottle or in a prepackaged cloth to use on your skin. Carefully follow your health care provider's instructions and the instructions on the product label.  Do not use CHG if you have a chlorhexidine allergy.  Contact your health care provider if your skin gets irritated after scrubbing. This information is not intended to replace advice given to you by your health care provider. Make sure you discuss any questions you have with your health care provider. Document Revised: 09/20/2019 Document Reviewed: 09/20/2019 Elsevier Patient Education  [place].  Surgery for Rotator Cuff Tear, Care After This sheet gives you information about how to care for yourself after your procedure. Your health care provider may also give you more specific instructions. If you have problems or questions, contact your health care provider. What can I expect after the procedure? After the procedure, it is common to have:  Redness.  Swelling.  A small amount of fluid or blood in the incision area.  Pain.  Stiffness. Follow these instructions at home: If you have a sling or a shoulder immobilizer:  Wear it as told by your health care provider. Remove it only as told by  your health care provider.  Loosen it if your fingers tingle, become numb, or turn cold and blue.  Keep it clean and dry. Bathing  Do not take baths, swim,  or use a hot tub until your health care provider approves. Ask your health care provider if you may take showers. You may only be allowed to take sponge baths.  Keep your bandage (dressing) dry until your health care provider says it can be removed.  If your sling or shoulder immobilizer is not waterproof: ? Do not let it get wet. ? Remove it when you take a bath or shower as told by your health care provider. Once the sling or shoulder immobilizer is removed, try not to move your shoulder until your health care provider says that you can. Incision care  Follow instructions from your health care provider about how to take care of your incision. Make sure you: ? Wash your hands with soap and water for at least 20 seconds before and after you change your dressing. If soap and water are not available, use hand sanitizer. ? Change your dressing as told by your health care provider. ? Leave stitches (sutures), skin glue, or adhesive strips in place. These skin closures may need to stay in place for 2 weeks or longer. If adhesive strip edges start to loosen and curl up, you may trim the loose edges. Do not remove adhesive strips completely unless your health care provider tells you to do that.  Check your incision area every day for signs of infection. Check for: ? More redness, swelling, or pain. ? More fluid or blood. ? Warmth. ? Pus or a bad smell.   Managing pain, stiffness, and swelling  If directed, put ice on your shoulder area. To do this: ? Put ice in a plastic bag. ? Place a towel between your skin and the bag. ? Leave the ice on for 20 minutes, 2-3 times a day. ? Remove the ice if your skin turns bright red. This is very important. If you cannot feel pain, heat, or cold, you have a greater risk of damage to the area.  Move your fingers often to reduce stiffness and swelling.  Raise (elevate) your upper body on pillows when you lie down and when you sleep. ? Do not sleep on the  front of your body (abdomen). ? Do not sleep on the side that your surgery was performed on.   Medicines  Take over-the-counter and prescription medicines only as told by your health care provider.  Ask your health care provider if the medicine prescribed to you: ? Requires you to avoid driving or using machinery. ? Can cause constipation. You may need to take these actions to prevent or treat constipation:  Drink enough fluid to keep your urine pale yellow.  Take over-the-counter or prescription medicines.  Eat foods that are high in fiber, such as beans, whole grains, and fresh fruits and vegetables.  Limit foods that are high in fat and processed sugars, such as fried or sweet foods. Driving  If you were given a sedative during the procedure, it can affect you for several hours. Do not drive or operate machinery until your health care provider says that it is safe.  Do not drive while wearing a sling or a shoulder immobilizer. Ask your health care provider when it is safe to drive. Activity  Do not use your arm to support your body weight until your health care provider says that you can.  Do not lift or hold anything with your arm until your health care provider approves.  Return to your normal activities as told by your health care provider. Ask your health care provider what activities are safe for you.  Do exercises as told by your health care provider. General instructions  Do not use any products that contain nicotine or tobacco, such as cigarettes, e-cigarettes, and chewing tobacco. These can delay healing after surgery. If you need help quitting, ask your health care provider.  Keep all follow-up visits. This is important. Contact a health care provider if:  You have a fever or chills.  You have more redness, swelling, or pain around your incision.  You have more fluid or blood coming from your incision.  Your incision feels warm to the touch.  You have pus or  a bad smell coming from your incision.  You have pain that gets worse or does not get better with medicine. Get help right away if:  You have severe pain.  You lose feeling in your arm or hand.  Your hand or fingers turn very pale or blue. Summary  If you have a sling or shoulder immobilizer, wear it as told by your health care provider. Remove it only as told by your health care provider.  Change your dressing as told by your health care provider. Check the incision area every day for signs of infection.  If directed, put ice on your shoulder area 2-3 times a day.  Do not use your arm to lift anything or to support your body weight until your health care provider says that you can. This information is not intended to replace advice given to you by your health care provider. Make sure you discuss any questions you have with your health care provider. Document Revised: 08/07/2019 Document Reviewed: 08/07/2019 Elsevier Patient Education  2021 [place] Anesthesia, Adult, Care After This sheet gives you information about how to care for yourself after your procedure. Your health care provider may also give you more specific instructions. If you have problems or questions, contact your health care provider. What can I expect after the procedure? After the procedure, the following side effects are common:  Pain or discomfort at the IV site.  Nausea.  Vomiting.  Sore throat.  Trouble concentrating.  Feeling cold or chills.  Feeling weak or tired.  Sleepiness and fatigue.  Soreness and body aches. These side effects can affect parts of the body that were not involved in surgery. Follow these instructions at home: For the time period you were told by your health care provider:  Rest.  Do not participate in activities where you could fall or become injured.  Do not drive or use machinery.  Do not drink alcohol.  Do not take sleeping pills or medicines that  cause drowsiness.  Do not make important decisions or sign legal documents.  Do not take care of children on your own.   Eating and drinking  Follow any instructions from your health care provider about eating or drinking restrictions.  When you feel hungry, start by eating small amounts of foods that are soft and easy to digest (bland), such as toast. Gradually return to your regular diet.  Drink enough fluid to keep your urine pale yellow.  If you vomit, rehydrate by drinking water, juice, or clear broth. General instructions  If you have sleep apnea, surgery and certain medicines can increase your risk for breathing problems. Follow instructions from your  health care provider about wearing your sleep device: ? Anytime you are sleeping, including during daytime naps. ? While taking prescription pain medicines, sleeping medicines, or medicines that make you drowsy.  Have a responsible adult stay with you for the time you are told. It is important to have someone help care for you until you are awake and alert.  Return to your normal activities as told by your health care provider. Ask your health care provider what activities are safe for you.  Take over-the-counter and prescription medicines only as told by your health care provider.  If you smoke, do not smoke without supervision.  Keep all follow-up visits as told by your health care provider. This is important. Contact a health care provider if:  You have nausea or vomiting that does not get better with medicine.  You cannot eat or drink without vomiting.  You have pain that does not get better with medicine.  You are unable to pass urine.  You develop a skin rash.  You have a fever.  You have redness around your IV site that gets worse. Get help right away if:  You have difficulty breathing.  You have chest pain.  You have blood in your urine or stool, or you vomit blood. Summary  After the procedure, it is  common to have a sore throat or nausea. It is also common to feel tired.  Have a responsible adult stay with you for the time you are told. It is important to have someone help care for you until you are awake and alert.  When you feel hungry, start by eating small amounts of foods that are soft and easy to digest (bland), such as toast. Gradually return to your regular diet.  Drink enough fluid to keep your urine pale yellow.  Return to your normal activities as told by your health care provider. Ask your health care provider what activities are safe for you. This information is not intended to replace advice given to you by your health care provider. Make sure you discuss any questions you have with your health care provider. Document Revised: 12/19/2019 Document Reviewed: 07/18/2019 Elsevier Patient Education  2021 [employer].

## 2020-07-02 ENCOUNTER — Encounter (HOSPITAL_COMMUNITY): Payer: Self-pay

## 2020-07-02 ENCOUNTER — Encounter (HOSPITAL_COMMUNITY)
Admission: RE | Admit: 2020-07-02 | Discharge: 2020-07-02 | Disposition: A | Discharge status: Home / Self Care | Payer: PRIVATE HEALTH INSURANCE | Source: Ambulatory Visit | Attending: Orthopedic Surgery | Admitting: Orthopedic Surgery

## 2020-07-02 ENCOUNTER — Other Ambulatory Visit: Payer: Self-pay

## 2020-07-02 ENCOUNTER — Other Ambulatory Visit: Payer: Self-pay | Admitting: Orthopedic Surgery

## 2020-07-02 ENCOUNTER — Other Ambulatory Visit (HOSPITAL_COMMUNITY)
Admission: RE | Admit: 2020-07-02 | Discharge: 2020-07-02 | Disposition: A | Discharge status: Home / Self Care | Payer: No Typology Code available for payment source | Source: Ambulatory Visit | Attending: Orthopedic Surgery | Admitting: Orthopedic Surgery

## 2020-07-02 ENCOUNTER — Telehealth: Payer: Self-pay | Admitting: Orthopedic Surgery

## 2020-07-02 DIAGNOSIS — Z01812 Encounter for preprocedural laboratory examination: Secondary | ICD-10-CM | POA: Insufficient documentation

## 2020-07-02 DIAGNOSIS — Z20822 Contact with and (suspected) exposure to covid-19: Secondary | ICD-10-CM | POA: Diagnosis not present

## 2020-07-02 LAB — CBC WITH DIFFERENTIAL/PLATELET
Abs Immature Granulocytes: 0.01 10*3/uL (ref 0.00–0.07)
Basophils Absolute: 0 10*3/uL (ref 0.0–0.1)
Basophils Relative: 0 %
Eosinophils Absolute: 0.2 10*3/uL (ref 0.0–0.5)
Eosinophils Relative: 5 %
HCT: 34.8 % — ABNORMAL LOW (ref 36.0–46.0)
Hemoglobin: 11.6 g/dL — ABNORMAL LOW (ref 12.0–15.0)
Immature Granulocytes: 0 %
Lymphocytes Relative: 24 %
Lymphs Abs: 0.8 10*3/uL (ref 0.7–4.0)
MCH: 31.1 pg (ref 26.0–34.0)
MCHC: 33.3 g/dL (ref 30.0–36.0)
MCV: 93.3 fL (ref 80.0–100.0)
Monocytes Absolute: 0.3 10*3/uL (ref 0.1–1.0)
Monocytes Relative: 11 %
Neutro Abs: 1.9 10*3/uL (ref 1.7–7.7)
Neutrophils Relative %: 60 %
Platelets: 284 10*3/uL (ref 150–400)
RBC: 3.73 MIL/uL — ABNORMAL LOW (ref 3.87–5.11)
RDW: 13.8 % (ref 11.5–15.5)
WBC: 3.2 10*3/uL — ABNORMAL LOW (ref 4.0–10.5)
nRBC: 0 % (ref 0.0–0.2)

## 2020-07-02 LAB — BASIC METABOLIC PANEL
Anion gap: 11 (ref 5–15)
BUN: 19 mg/dL (ref 8–23)
CO2: 26 mmol/L (ref 22–32)
Calcium: 9.6 mg/dL (ref 8.9–10.3)
Chloride: 101 mmol/L (ref 98–111)
Creatinine, Ser: 0.86 mg/dL (ref 0.44–1.00)
GFR, Estimated: >60 mL/min (ref >60)
Glucose, Bld: 142 mg/dL — ABNORMAL HIGH (ref 70–99)
Potassium: 3.3 mmol/L — ABNORMAL LOW (ref 3.5–5.1)
Sodium: 138 mmol/L (ref 135–145)

## 2020-07-02 LAB — HEMOGLOBIN A1C
Hgb A1c MFr Bld: 6.3 % — ABNORMAL HIGH (ref 4.8–5.6)
Mean Plasma Glucose: 134.11 mg/dL

## 2020-07-02 LAB — SARS CORONAVIRUS 2 (TAT 6-24 HRS): SARS Coronavirus 2: NEGATIVE

## 2020-07-02 NOTE — Telephone Encounter (Signed)
I called, pending approval clinicals received 14th transferred to nurse and cut off during transfer.

## 2020-07-02 NOTE — Telephone Encounter (Signed)
I called again transferred again, this time had to leave message for a call back.

## 2020-07-02 NOTE — Telephone Encounter (Signed)
Please let me know how I can help with this. 

## 2020-07-02 NOTE — H&P (Signed)
Hannah Cooper is an 65 y.o. female.    Chief Complaint: Pain left shoulder HPI: 65 year old female with chronic pain left shoulder.  Patient was treated nonoperatively and did not improve.  She eventually had an MRI which showed she had a left rotator cuff tear.  After work-up included cardiology clearance patient has been cleared for repair of her left rotator cuff tear which is because of severe pain and loss of function including loss of the ability to perform activities of daily living.  Past Medical History:  Diagnosis Date  . Arthritis   . Atypical chest pain    chronic  . Cancer ([place])    bone cancer  . Chronic back pain   . Chronic diastolic CHF (congestive heart failure) ([place])   . COPD (chronic obstructive pulmonary disease) ([place])   . DDD (degenerative disc disease), cervical   . History of radiation therapy 07/16/19-08/22/19   L4 spine ; Dr. Gery Cooper  . Hyperlipidemia   . Hypertension   . Lumbar radiculopathy   . Meningitis   . Normal coronary arteries 2014  . Palpitations   . Premature atrial contractions   . PVC's (premature ventricular contractions)     Past Surgical History:  Procedure Laterality Date  . ABDOMINAL HYSTERECTOMY    . IR FLUORO GUIDED NEEDLE PLC ASPIRATION/INJECTION LOC  06/19/2019  . LEFT HEART CATHETERIZATION WITH CORONARY ANGIOGRAM N/A 09/05/2012   Procedure: LEFT HEART CATHETERIZATION WITH CORONARY ANGIOGRAM;  Surgeon: Hannah Hampshire, MD;  Location: [facility] CATH [facility];  Service: Cardiovascular;  Laterality: N/A;  . THYROID SURGERY  2002    Family History  Problem Relation Age of Onset  . Heart attack Mother 67  . Heart attack Sister 71   Social History:  reports that she has never smoked. She has never used smokeless tobacco. She reports that she does not drink alcohol and does not use drugs.  Allergies:  Allergies  Allergen Reactions  . Other Swelling    [place] lipstick    No medications prior to admission.    Results for orders  placed or performed during the hospital encounter of 07/02/20 (from the past 48 hour(s))  Basic metabolic panel     Status: Abnormal   Collection Time: 07/02/20 10:00 AM  Result Value Ref Range   Sodium 138 135 - 145 mmol/L   Potassium 3.3 (L) 3.5 - 5.1 mmol/L   Chloride 101 98 - 111 mmol/L   CO2 26 22 - 32 mmol/L   Glucose, Bld 142 (H) 70 - 99 mg/dL    Comment: Glucose reference range applies only to samples taken after fasting for at least 8 hours.   BUN 19 8 - 23 mg/dL   Creatinine, Ser 0.86 0.44 - 1.00 mg/dL   Calcium 9.6 8.9 - 10.3 mg/dL   GFR, Estimated >60 >60 mL/min    Comment: (NOTE) Calculated using the CKD-EPI Creatinine Equation (2021)    Anion gap 11 5 - 15    Comment: Performed at [facility], [address] [place], [place] [address]  CBC WITH DIFFERENTIAL     Status: Abnormal   Collection Time: 07/02/20 10:00 AM  Result Value Ref Range   WBC 3.2 (L) 4.0 - 10.5 K/uL   RBC 3.73 (L) 3.87 - 5.11 MIL/uL   Hemoglobin 11.6 (L) 12.0 - 15.0 g/dL   HCT 34.8 (L) 36.0 - 46.0 %   MCV 93.3 80.0 - 100.0 fL   MCH 31.1 26.0 - 34.0 pg   MCHC  33.3 30.0 - 36.0 g/dL   RDW 13.8 11.5 - 15.5 %   Platelets 284 150 - 400 K/uL   nRBC 0.0 0.0 - 0.2 %   Neutrophils Relative % 60 %   Neutro Abs 1.9 1.7 - 7.7 K/uL   Lymphocytes Relative 24 %   Lymphs Abs 0.8 0.7 - 4.0 K/uL   Monocytes Relative 11 %   Monocytes Absolute 0.3 0.1 - 1.0 K/uL   Eosinophils Relative 5 %   Eosinophils Absolute 0.2 0.0 - 0.5 K/uL   Basophils Relative 0 %   Basophils Absolute 0.0 0.0 - 0.1 K/uL   Immature Granulocytes 0 %   Abs Immature Granulocytes 0.01 0.00 - 0.07 K/uL    Comment: Performed at [facility], [address], [place], [place] [address]   No results found.  Review of Systems  All other systems reviewed and are negative.   There were no vitals taken for this visit. Physical Exam   General appearance is normal.  She is 5 foot 5 inches and 102 kg  Neurologic and psychologic exam she is  awake alert and oriented x3 mood and affect is normal she has no sensory abnormalities in the upper extremities  Skin is normal over the left shoulder and surgical site  Musculoskeletal exam the patient has pain and weakness in the left shoulder she has decreased range of motion and rotator cuff weakness there is no visible atrophy in the shoulder does not exhibit laxity in either plane  Cardiovascular normal pulse and perfusion without edema in the upper extremities  X-ray was unremarkable no major abnormalities  MRI shows a rotator cuff tear left shoulder  Report:IMPRESSION: 1. Full-thickness partial width tear of the anterior supraspinatus tendon distally, with 1.1 cm retraction. 2. Moderate supraspinatus tendinopathy with mild infraspinatus and subscapularis tendinopathy. 3. Moderate degenerative chondral thinning in the glenohumeral joint. Moderate degenerative AC joint arthropathy with unfavorable subacromial morphology. 4. Potential tear of the posterior labrum, although a similar appearance can be caused by and invagination of the capsule adjacent to the labrum simulating a tear.     Electronically Signed   By: Hannah Cooper M.D.   On: 06/05/2020 09:40   Assessment and plan  Full-thickness tear left rotator cuff  Plan open left rotator cuff repair  Hannah Abbott, MD 07/02/2020, 12:18 PM

## 2020-07-02 NOTE — Telephone Encounter (Signed)
I have approval from primary Secondary has not approved  I will need to work on this today during lunch, surgery tomorrow.  To you FYI  I may not get a lunch today depending on how long it takes [name] to answer phone.

## 2020-07-02 NOTE — Telephone Encounter (Signed)
Renee from [facility] called and stated pt's  Insurance has not approved this surgery for tomorrow.  They stated to her that they need more clinical  Information.  Renee said that if not approved, can we get this rescheduled

## 2020-07-02 NOTE — Telephone Encounter (Signed)
Called again spoke to [place] and got approval good through 09/29/20

## 2020-07-03 ENCOUNTER — Encounter (HOSPITAL_COMMUNITY): Payer: Self-pay | Admitting: Orthopedic Surgery

## 2020-07-03 ENCOUNTER — Ambulatory Visit (HOSPITAL_COMMUNITY): Payer: PRIVATE HEALTH INSURANCE | Admitting: Anesthesiology

## 2020-07-03 ENCOUNTER — Encounter (HOSPITAL_COMMUNITY)
Admission: RE | Disposition: A | Discharge status: Home / Self Care | Payer: Self-pay | Source: Home / Self Care | Attending: Orthopedic Surgery

## 2020-07-03 ENCOUNTER — Ambulatory Visit (HOSPITAL_COMMUNITY)
Admission: RE | Admit: 2020-07-03 | Discharge: 2020-07-03 | Disposition: A | Discharge status: Home / Self Care | Payer: PRIVATE HEALTH INSURANCE | Attending: Orthopedic Surgery | Admitting: Orthopedic Surgery

## 2020-07-03 ENCOUNTER — Other Ambulatory Visit: Payer: Self-pay

## 2020-07-03 DIAGNOSIS — E785 Hyperlipidemia, unspecified: Secondary | ICD-10-CM | POA: Insufficient documentation

## 2020-07-03 DIAGNOSIS — M75122 Complete rotator cuff tear or rupture of left shoulder, not specified as traumatic: Secondary | ICD-10-CM | POA: Diagnosis not present

## 2020-07-03 DIAGNOSIS — I110 Hypertensive heart disease with heart failure: Secondary | ICD-10-CM | POA: Insufficient documentation

## 2020-07-03 DIAGNOSIS — Z90710 Acquired absence of both cervix and uterus: Secondary | ICD-10-CM | POA: Diagnosis not present

## 2020-07-03 DIAGNOSIS — G8918 Other acute postprocedural pain: Secondary | ICD-10-CM | POA: Diagnosis not present

## 2020-07-03 DIAGNOSIS — Z8249 Family history of ischemic heart disease and other diseases of the circulatory system: Secondary | ICD-10-CM | POA: Diagnosis not present

## 2020-07-03 DIAGNOSIS — I5032 Chronic diastolic (congestive) heart failure: Secondary | ICD-10-CM | POA: Insufficient documentation

## 2020-07-03 DIAGNOSIS — J449 Chronic obstructive pulmonary disease, unspecified: Secondary | ICD-10-CM | POA: Diagnosis not present

## 2020-07-03 HISTORY — PX: RESECTION DISTAL CLAVICAL: SHX5053

## 2020-07-03 HISTORY — PX: SHOULDER OPEN ROTATOR CUFF REPAIR: SHX2407

## 2020-07-03 LAB — GLUCOSE, CAPILLARY: Glucose-Capillary: 104 mg/dL — ABNORMAL HIGH (ref 70–99)

## 2020-07-03 SURGERY — REPAIR, ROTATOR CUFF, OPEN
Anesthesia: General | Site: Shoulder | Laterality: Left

## 2020-07-03 MED ORDER — SUGAMMADEX SODIUM 200 MG/2ML IV SOLN
INTRAVENOUS | Status: DC | PRN
  Administered 2020-07-03: 200 mg via INTRAVENOUS

## 2020-07-03 MED ORDER — BUPIVACAINE-EPINEPHRINE (PF) 0.5% -1:200000 IJ SOLN
INTRAMUSCULAR | Status: DC | PRN
  Administered 2020-07-03: 14 mL via PERINEURAL

## 2020-07-03 MED ORDER — MEPERIDINE HCL 50 MG/ML IJ SOLN: 6.2500 mg | INTRAMUSCULAR | Status: DC | PRN

## 2020-07-03 MED ORDER — BUPIVACAINE-EPINEPHRINE (PF) 0.5% -1:200000 IJ SOLN
INTRAMUSCULAR | Status: AC
  Filled 2020-07-03: qty 30

## 2020-07-03 MED ORDER — ONDANSETRON HCL 4 MG/2ML IJ SOLN
4.0000 mg | Freq: Once | INTRAMUSCULAR | Status: AC
  Administered 2020-07-03: 4 mg via INTRAVENOUS
  Filled 2020-07-03: qty 2

## 2020-07-03 MED ORDER — 0.9 % SODIUM CHLORIDE (POUR BTL) OPTIME
TOPICAL | Status: DC | PRN
  Administered 2020-07-03: 1000 mL

## 2020-07-03 MED ORDER — LIDOCAINE HCL (PF) 1 % IJ SOLN
INTRAMUSCULAR | Status: AC
  Filled 2020-07-03: qty 30

## 2020-07-03 MED ORDER — BUPIVACAINE LIPOSOME 1.3 % IJ SUSP
INTRAMUSCULAR | Status: AC
  Filled 2020-07-03: qty 20

## 2020-07-03 MED ORDER — BUPIVACAINE HCL (PF) 0.5 % IJ SOLN
INTRAMUSCULAR | Status: AC
  Filled 2020-07-03: qty 30

## 2020-07-03 MED ORDER — CEFAZOLIN SODIUM-DEXTROSE 2-4 GM/100ML-% IV SOLN
2.0000 g | INTRAVENOUS | Status: AC
  Administered 2020-07-03: 2 g via INTRAVENOUS
  Filled 2020-07-03: qty 100

## 2020-07-03 MED ORDER — PROPOFOL 10 MG/ML IV BOLUS
INTRAVENOUS | Status: AC
  Filled 2020-07-03: qty 20

## 2020-07-03 MED ORDER — PREGABALIN 50 MG PO CAPS
50.0000 mg | ORAL_CAPSULE | Freq: Once | ORAL | Status: AC
  Administered 2020-07-03: 50 mg via ORAL
  Filled 2020-07-03: qty 1

## 2020-07-03 MED ORDER — LIDOCAINE HCL (PF) 1 % IJ SOLN
INTRAMUSCULAR | Status: DC | PRN
  Administered 2020-07-03: 2 mL

## 2020-07-03 MED ORDER — HYDROMORPHONE HCL 1 MG/ML IJ SOLN: 0.2500 mg | INTRAMUSCULAR | Status: DC | PRN

## 2020-07-03 MED ORDER — PROMETHAZINE HCL 25 MG/ML IJ SOLN
6.2500 mg | INTRAMUSCULAR | Status: DC | PRN
Start: 2020-07-03 — End: 2020-07-03

## 2020-07-03 MED ORDER — LIDOCAINE HCL (PF) 2 % IJ SOLN
INTRAMUSCULAR | Status: AC
  Filled 2020-07-03: qty 5

## 2020-07-03 MED ORDER — ONDANSETRON HCL 4 MG/2ML IJ SOLN
INTRAMUSCULAR | Status: DC | PRN
  Administered 2020-07-03: 4 mg via INTRAVENOUS

## 2020-07-03 MED ORDER — BUPIVACAINE HCL (PF) 0.5 % IJ SOLN
INTRAMUSCULAR | Status: DC | PRN
  Administered 2020-07-03: 10 mL via PERINEURAL

## 2020-07-03 MED ORDER — ROCURONIUM BROMIDE 10 MG/ML (PF) SYRINGE
PREFILLED_SYRINGE | INTRAVENOUS | Status: DC | PRN
  Administered 2020-07-03: 60 mg via INTRAVENOUS
  Administered 2020-07-03: 20 mg via INTRAVENOUS

## 2020-07-03 MED ORDER — CELECOXIB 400 MG PO CAPS
400.0000 mg | ORAL_CAPSULE | Freq: Once | ORAL | Status: AC
  Administered 2020-07-03: 400 mg via ORAL
  Filled 2020-07-03: qty 1

## 2020-07-03 MED ORDER — LACTATED RINGERS IV SOLN: INTRAVENOUS | Status: DC

## 2020-07-03 MED ORDER — DEXAMETHASONE SODIUM PHOSPHATE 10 MG/ML IJ SOLN
INTRAMUSCULAR | Status: AC
  Filled 2020-07-03: qty 1

## 2020-07-03 MED ORDER — BUPIVACAINE LIPOSOME 1.3 % IJ SUSP
INTRAMUSCULAR | Status: DC | PRN
  Administered 2020-07-03: 10 mL via PERINEURAL

## 2020-07-03 MED ORDER — DEXAMETHASONE SODIUM PHOSPHATE 10 MG/ML IJ SOLN
INTRAMUSCULAR | Status: DC | PRN
  Administered 2020-07-03: 8 mg via INTRAVENOUS

## 2020-07-03 MED ORDER — MIDAZOLAM HCL 2 MG/2ML IJ SOLN
2.0000 mg | Freq: Once | INTRAMUSCULAR | Status: AC
  Administered 2020-07-03: 2 mg via INTRAVENOUS
  Filled 2020-07-03: qty 2

## 2020-07-03 MED ORDER — BUPIVACAINE LIPOSOME 1.3 % IJ SUSP
INTRAMUSCULAR | Status: AC
  Filled 2020-07-03: qty 10

## 2020-07-03 MED ORDER — CHLORHEXIDINE GLUCONATE 0.12 % MT SOLN
15.0000 mL | Freq: Once | OROMUCOSAL | Status: AC
  Administered 2020-07-03: 15 mL via OROMUCOSAL
  Filled 2020-07-03: qty 15

## 2020-07-03 MED ORDER — METHOCARBAMOL 1000 MG/10ML IJ SOLN
500.0000 mg | Freq: Once | INTRAVENOUS | Status: DC
  Filled 2020-07-03: qty 5

## 2020-07-03 MED ORDER — LIDOCAINE 2% (20 MG/ML) 5 ML SYRINGE
INTRAMUSCULAR | Status: DC | PRN
  Administered 2020-07-03: 40 mg via INTRAVENOUS

## 2020-07-03 MED ORDER — PROPOFOL 10 MG/ML IV BOLUS
INTRAVENOUS | Status: DC | PRN
  Administered 2020-07-03: 130 mg via INTRAVENOUS

## 2020-07-03 MED ORDER — GABAPENTIN 100 MG PO CAPS: 100.0000 mg | ORAL_CAPSULE | Freq: Three times a day (TID) | ORAL | 0 refills | Status: DC

## 2020-07-03 MED ORDER — ORAL CARE MOUTH RINSE: 15.0000 mL | Freq: Once | OROMUCOSAL | Status: AC

## 2020-07-03 SURGICAL SUPPLY — 58 items
APL PRP STRL LF DISP 70% ISPRP (MISCELLANEOUS) ×1
APL SKNCLS STERI-STRIP NONHPOA (GAUZE/BANDAGES/DRESSINGS) ×1
BENZOIN TINCTURE PRP APPL 2/3 (GAUZE/BANDAGES/DRESSINGS) ×2 IMPLANT
BIT DRILL 2.0MX128MM (BIT) ×1 IMPLANT
BLADE HEX COATED 2.75 (ELECTRODE) ×2 IMPLANT
BLADE OSC/SAGITTAL MD 9X18.5 (BLADE) ×1 IMPLANT
BUR FAST CUTTING (BURR) ×2
BUR SRG 54X4.7X12 FLUT (BURR) IMPLANT
BURR SRG 54X4.7X12 FLUT (BURR) ×1
CHLORAPREP W/TINT 26 (MISCELLANEOUS) ×2 IMPLANT
CLOTH BEACON ORANGE TIMEOUT ST (SAFETY) ×2 IMPLANT
CLSR STERI-STRIP ANTIMIC 1/2X4 (GAUZE/BANDAGES/DRESSINGS) ×2 IMPLANT
COOLER ICEMAN CLASSIC (MISCELLANEOUS) ×2 IMPLANT
COVER LIGHT HANDLE STERIS (MISCELLANEOUS) ×4 IMPLANT
COVER WAND RF STERILE (DRAPES) ×2 IMPLANT
DECANTER SPIKE VIAL GLASS SM (MISCELLANEOUS) ×1 IMPLANT
DRAPE ORTHO 2.5IN SPLIT 77X108 (DRAPES) ×2 IMPLANT
DRAPE ORTHO SPLIT 77X108 STRL (DRAPES) ×4
DRESSING MEPILEX BORDER 6X8 (GAUZE/BANDAGES/DRESSINGS) ×1 IMPLANT
DRSG MEPILEX BORDER 6X8 (GAUZE/BANDAGES/DRESSINGS) ×2
ELECT REM PT RETURN 9FT ADLT (ELECTROSURGICAL) ×2
ELECTRODE REM PT RTRN 9FT ADLT (ELECTROSURGICAL) ×1 IMPLANT
GLOVE SKINSENSE NS SZ8.0 LF (GLOVE) ×1
GLOVE SKINSENSE STRL SZ8.0 LF (GLOVE) ×1 IMPLANT
GLOVE SS N UNI LF 8.5 STRL (GLOVE) ×2 IMPLANT
GLOVE SURG UNDER POLY LF SZ7 (GLOVE) ×6 IMPLANT
GOWN STRL REUS W/TWL LRG LVL3 (GOWN DISPOSABLE) ×4 IMPLANT
GOWN STRL REUS W/TWL XL LVL3 (GOWN DISPOSABLE) ×2 IMPLANT
IMPL SPEEDBRIDGE KIT (Orthopedic Implant) IMPLANT
IMPLANT SPEEDBRIDGE KIT (Orthopedic Implant) ×2 IMPLANT
INST SET MINOR BONE (KITS) ×2 IMPLANT
KIT BLADEGUARD II DBL (SET/KITS/TRAYS/PACK) ×2 IMPLANT
KIT TURNOVER KIT A (KITS) ×2 IMPLANT
MANIFOLD NEPTUNE II (INSTRUMENTS) ×2 IMPLANT
MARKER SKIN DUAL TIP RULER LAB (MISCELLANEOUS) ×2 IMPLANT
NDL HYPO 21X1.5 SAFETY (NEEDLE) ×1 IMPLANT
NDL MA TROC 1/2 (NEEDLE) IMPLANT
NDL MAYO 6 CRC TAPER PT (NEEDLE) IMPLANT
NEEDLE HYPO 21X1.5 SAFETY (NEEDLE) ×2 IMPLANT
NEEDLE MA TROC 1/2 (NEEDLE) ×2 IMPLANT
NEEDLE MAYO 6 CRC TAPER PT (NEEDLE) IMPLANT
NS IRRIG 1000ML POUR BTL (IV SOLUTION) ×2 IMPLANT
PACK TOTAL JOINT (CUSTOM PROCEDURE TRAY) ×2 IMPLANT
PAD ARMBOARD 7.5X6 YLW CONV (MISCELLANEOUS) ×2 IMPLANT
PAD COLD SHLDR UNI XL WRAP-ON (PAD) ×2
PAD COLD UNI XL WRAP-ON (PAD) IMPLANT
RASP SM TEAR CROSS CUT (RASP) ×1 IMPLANT
SET BASIN LINEN APH (SET/KITS/TRAYS/PACK) ×2 IMPLANT
SLING ARM IMMOBILIZER LRG (SOFTGOODS) ×1 IMPLANT
SUT BONE WAX W31G (SUTURE) ×1 IMPLANT
SUT ETHIBOND NAB OS 4 #2 30IN (SUTURE) ×2 IMPLANT
SUT MON AB 0 CT1 (SUTURE) ×2 IMPLANT
SUT MON AB 2-0 CT1 36 (SUTURE) ×2 IMPLANT
SUT VIC AB 1 CT1 27 (SUTURE) ×2
SUT VIC AB 1 CT1 27XBRD ANTBC (SUTURE) IMPLANT
SYR 30ML LL (SYRINGE) ×1 IMPLANT
SYR BULB IRRIG 60ML STRL (SYRINGE) ×2 IMPLANT
YANKAUER SUCT 12FT TUBE ARGYLE (SUCTIONS) ×1 IMPLANT

## 2020-07-03 NOTE — Transfer of Care (Signed)
Immediate Anesthesia Transfer of Care Note  Patient: Hannah Cooper  Procedure(s) Performed: ROTATOR CUFF REPAIR SHOULDER OPEN WITH CHROMEOPLASTY (Left Shoulder) RESECTION DISTAL CLAVICAL (Left Shoulder)  Patient Location: PACU  Anesthesia Type:GA combined with regional for post-op pain  Level of Consciousness: sedated and responds to stimulation  Airway & Oxygen Therapy: Patient Spontanous Breathing and Patient connected to nasal cannula oxygen  Post-op Assessment: Report given to RN and Post -op Vital signs reviewed and stable  Post vital signs: Reviewed and stable  Last Vitals:  Vitals Value Taken Time  BP 122/77 07/03/20 1101  Temp    Pulse 60 07/03/20 1103  Resp 19 07/03/20 1103  SpO2 94 % 07/03/20 1103  Vitals shown include unvalidated device data.  Last Pain:  Vitals:   07/03/20 0827  TempSrc: Oral  PainSc: 7          Complications: No complications documented.

## 2020-07-03 NOTE — Discharge Instructions (Signed)
General Anesthesia, Adult, Care After This sheet gives you information about how to care for yourself after your procedure. Your health care provider may also give you more specific instructions. If you have problems or questions, contact your health care provider. What can I expect after the procedure? After the procedure, the following side effects are common:  Pain or discomfort at the IV site.  Nausea.  Vomiting.  Sore throat.  Trouble concentrating.  Feeling cold or chills.  Feeling weak or tired.  Sleepiness and fatigue.  Soreness and body aches. These side effects can affect parts of the body that were not involved in surgery. Follow these instructions at home: For the time period you were told by your health care provider:  Rest.  Do not participate in activities where you could fall or become injured.  Do not drive or use machinery.  Do not drink alcohol.  Do not take sleeping pills or medicines that cause drowsiness.  Do not make important decisions or sign legal documents.  Do not take care of children on your own.   Eating and drinking  Follow any instructions from your health care provider about eating or drinking restrictions.  When you feel hungry, start by eating small amounts of foods that are soft and easy to digest (bland), such as toast. Gradually return to your regular diet.  Drink enough fluid to keep your urine pale yellow.  If you vomit, rehydrate by drinking water, juice, or clear broth. General instructions  If you have sleep apnea, surgery and certain medicines can increase your risk for breathing problems. Follow instructions from your health care provider about wearing your sleep device: ? Anytime you are sleeping, including during daytime naps. ? While taking prescription pain medicines, sleeping medicines, or medicines that make you drowsy.  Have a responsible adult stay with you for the time you are told. It is important to have  someone help care for you until you are awake and alert.  Return to your normal activities as told by your health care provider. Ask your health care provider what activities are safe for you.  Take over-the-counter and prescription medicines only as told by your health care provider.  If you smoke, do not smoke without supervision.  Keep all follow-up visits as told by your health care provider. This is important. Contact a health care provider if:  You have nausea or vomiting that does not get better with medicine.  You cannot eat or drink without vomiting.  You have pain that does not get better with medicine.  You are unable to pass urine.  You develop a skin rash.  You have a fever.  You have redness around your IV site that gets worse. Get help right away if:  You have difficulty breathing.  You have chest pain.  You have blood in your urine or stool, or you vomit blood. Summary  After the procedure, it is common to have a sore throat or nausea. It is also common to feel tired.  Have a responsible adult stay with you for the time you are told. It is important to have someone help care for you until you are awake and alert.  When you feel hungry, start by eating small amounts of foods that are soft and easy to digest (bland), such as toast. Gradually return to your regular diet.  Drink enough fluid to keep your urine pale yellow.  Return to your normal activities as told by your health care provider.   Ask your health care provider what activities are safe for you. This information is not intended to replace advice given to you by your health care provider. Make sure you discuss any questions you have with your health care provider. Document Revised: 12/19/2019 Document Reviewed: 07/18/2019 Elsevier Patient Education  2021 [place] Anesthesia  Regional anesthesia is a method used to temporarily block feeling in one area of the body. You may  have regional anesthesia before a medical procedure or surgery. A health care provider who specializes in giving anesthesia (anesthesiologist) injects a type of medicine near a nerve or a group of nerves. This medicine makes that area of the body numb. Regional anesthesia allows you to be awake during the procedure or surgery but keeps you from feeling pain in the affected area. There are three types of regional anesthesia:  Spinal anesthesia. This is a one-time injection of medicine into the fluid that surrounds your spinal cord. This numbs the area below and slightly above the injection site.  Epidural anesthesia. This is another medicine that may be placed into your back, but just outside of the protective tissue that covers your spinal cord. Instead of a one-time injection, the medicine is often given gradually over time through a small tube (catheter)that remains in your back for as long as pain control is needed.  Peripheral nerve block. This is an injection that is given in an area of the body other than the spine to block all feeling below the injection site. Peripheral nerve blocks may be given as a single injection before your procedure or may be given through a catheter for as long as you need pain control. Regional anesthesia can be used alone or in combination with other types of anesthesia. Compared to using medicine that makes you fall asleep (general anesthetic), regional anesthesia has many benefits, such as:  Improved pain control after your surgery.  Less nausea, vomiting, or drowsiness after surgery.  A faster recovery. Tell a health care provider about:  Any allergies you have.  All medicines you are taking, including vitamins, herbs, eye drops, creams, and over-the-counter medicines.  Any use of drugs, alcohol, or tobacco.  Any problems you or family members have had with anesthetic medicines.  Any blood disorders you have.  Any surgeries you have had.  Any medical  conditions you have or have had, especially heart failure, chronic obstructive pulmonary disease (COPD), or sleep apnea.  Whether you are pregnant or may be pregnant. What are the risks? Generally, this is a safe procedure. However, problems may occur, including:  Pain.  Nausea.  Vomiting.  Itching.  Low blood pressure.  Headache.  Nerve damage.  Infection.  Bleeding around the injection site.  Trouble urinating.  Allergic reactions to medicine. What happens before the procedure? Staying hydrated Follow instructions from your health care provider about hydration, which may include:  Up to 2 hours before the procedure - you may continue to drink clear liquids, such as water, clear fruit juice, black coffee, and plain tea. Eating and drinking restrictions Follow instructions from your health care provider about eating and drinking, which may include:  8 hours before the procedure - stop eating heavy meals or foods, such as meat, fried foods, or fatty foods.  6 hours before the procedure - stop eating light meals or foods, such as toast or cereal.  6 hours before the procedure - stop drinking milk or drinks that contain milk.  2 hours before  the procedure - stop drinking clear liquids. Medicines Ask your health care provider about:  Changing or stopping your regular medicines. This is especially important if you are taking diabetes medicines or blood thinners.  Taking medicines such as aspirin and ibuprofen. These medicines can thin your blood. Do not take these medicines unless your health care provider tells you to take them.  Taking over-the-counter medicines, vitamins, herbs, and supplements. General instructions  Plan to have a responsible adult take you home from the hospital or clinic.  If you will be going home right after the procedure, plan to have a responsible adult care for you for the time you are told. This is important.  You may need to have blood  or imaging tests.  Ask your health care provider what steps will be taken to help prevent infection. These may include washing skin with a germ-killing soap.  If you use a sleep apnea device, ask your health care provider whether you should bring it with you on the day of your surgery. What happens during the procedure?  Depending on the medical procedure you are having done, an IV may be inserted into one of your veins.  The anesthesiologist will do a physical exam to find the best location to give the regional anesthesia. To locate the nerve, he or she may also use: ? A device that activates the nerve and causes your muscles to twitch (nerve stimulator). ? An imaging tool that uses sound waves to create images of the area (ultrasound).  You may be given a medicine to help you relax (sedative).  A medicine called a local anesthetic may be injected to numb the area where the regional anesthetic will be injected.  You will get regional anesthesia by injection or through a catheter.  The anesthesiologist will check to make sure the medicine is working before the rest of your medical procedure begins.  Depending on the type of regional anesthesia you received, you may have a small bandage (dressing) placed over the injection site. The procedure may vary among health care providers and hospitals. What can I expect after the procedure? After your procedure, it is common to have:  Sleepiness.  Nausea.  Itching.  Numbness.  Shivering or feeling cold. Your blood pressure, heart rate, breathing rate, and blood oxygen level will be monitored until you leave the hospital or clinic. Follow these instructions at home:  If you were given a sedative during the procedure, it can affect you for several hours. Do not drive or operate machinery until your health care provider says that it is safe.  Take over-the-counter and prescription medicines only as told by your health care provider.  Do  not drive, exercise, or do any other activities that require coordination as told by your health care provider. Ask your health care provider when you can return to your usual activities.  Drink enough fluid to keep your urine pale yellow.  If you had a dressing placed over the injection site, only remove it when told to do so by your health care provider.  Keep all follow-up visits as told by your health care provider. This is important. Contact a health care provider if you:  Continue to have nausea and vomiting for more than 1 day.  Develop a rash.  Have trouble urinating. Get help right away if you:  Have bleeding from the injection site or bleeding under the skin at the injection site.  Have redness, swelling, or pain around your injection  site.  Have a fever.  Develop a headache.  Develop new numbness or weakness. Summary  Regional anesthesia is a method used to temporarily block feeling in one area of the body. It may be done to block pain during a medical procedure or surgery.  Follow instructions from your health care provider about taking medicines and about eating and drinking before the procedure.  Ask your health care provider when you can return to your usual activities after the procedure. This information is not intended to replace advice given to you by your health care provider. Make sure you discuss any questions you have with your health care provider. Document Revised: 08/02/2019 Document Reviewed: 05/21/2018 Elsevier Patient Education  2021 [place] UNTIL Tuesday July 07, 2020. DO NOT USE ANY ADDITIONAL NUMBING MEDICATIONS UNTIL AFTER TUESDAY   Bupivacaine Liposomal Suspension for Injection What is this medicine? BUPIVACAINE LIPOSOMAL (bue PIV a kane LIP oh som al) is an anesthetic. It causes loss of feeling in the skin or other tissues. It is used to prevent and to treat pain from some procedures. This  medicine may be used for other purposes; ask your health care provider or pharmacist if you have questions. COMMON BRAND NAME(S): EXPAREL What should I tell my health care provider before I take this medicine? They need to know if you have any of these conditions:  G6PD deficiency  heart disease  kidney disease  liver disease  low blood pressure  lung or breathing disease, like asthma  an unusual or allergic reaction to bupivacaine, other medicines, foods, dyes, or preservatives  pregnant or trying to get pregnant  breast-feeding How should I use this medicine? This medicine is injected into the affected area. It is given by a health care provider in a hospital or clinic setting. Talk to your health care provider about the use of this medicine in children. While it may be given to children as young as 6 years for selected conditions, precautions do apply. Overdosage: If you think you have taken too much of this medicine contact a poison control center or emergency room at once. NOTE: This medicine is only for you. Do not share this medicine with others. What if I miss a dose? This does not apply. What may interact with this medicine? This medicine may interact with the following medications:  acetaminophen  certain antibiotics like dapsone, nitrofurantoin, aminosalicylic acid, sulfonamides  certain medicines for seizures like phenobarbital, phenytoin, valproic acid  chloroquine  cyclophosphamide  flutamide  hydroxyurea  ifosfamide  metoclopramide  nitric oxide  nitroglycerin  nitroprusside  nitrous oxide  other local anesthetics like lidocaine, pramoxine, tetracaine  primaquine  quinine  rasburicase  sulfasalazine This list may not describe all possible interactions. Give your health care provider a list of all the medicines, herbs, non-prescription drugs, or dietary supplements you use. Also tell them if you smoke, drink alcohol, or use illegal  drugs. Some items may interact with your medicine. What should I watch for while using this medicine? Your condition will be monitored carefully while you are receiving this medicine. Be careful to avoid injury while the area is numb, and you are not aware of pain. What side effects may I notice from receiving this medicine? Side effects that you should report to your doctor or health care professional as soon as possible:  allergic reactions like skin rash, itching or hives, swelling of the face, lips, or tongue  seizures  signs and symptoms  of a dangerous change in heartbeat or heart rhythm like chest pain; dizziness; fast, irregular heartbeat; palpitations; feeling faint or lightheaded; falls; breathing problems  signs and symptoms of methemoglobinemia such as pale, gray, or blue colored skin; headache; fast heartbeat; shortness of breath; feeling faint or lightheaded, falls; tiredness Side effects that usually do not require medical attention (report to your doctor or health care professional if they continue or are bothersome):  anxious  back pain  changes in taste  changes in vision  constipation  dizziness  fever  nausea, vomiting This list may not describe all possible side effects. Call your doctor for medical advice about side effects. You may report side effects to FDA at 1-800-FDA-1088. Where should I keep my medicine? This drug is given in a hospital or clinic and will not be stored at home. NOTE: This sheet is a summary. It may not cover all possible information. If you have questions about this medicine, talk to your doctor, pharmacist, or health care provider.  2021 Elsevier/Gold Standard (2019-07-11 12:24:57)

## 2020-07-03 NOTE — Anesthesia Postprocedure Evaluation (Signed)
Anesthesia Post Note  Patient: Hannah Cooper  Procedure(s) Performed: ROTATOR CUFF REPAIR SHOULDER OPEN WITH CHROMEOPLASTY (Left Shoulder) RESECTION DISTAL CLAVICAL (Left Shoulder)  Patient location during evaluation: PACU Anesthesia Type: General Level of consciousness: awake and alert and oriented Pain management: pain level controlled Vital Signs Assessment: post-procedure vital signs reviewed and stable Respiratory status: spontaneous breathing and respiratory function stable Cardiovascular status: blood pressure returned to baseline and stable Postop Assessment: no apparent nausea or vomiting Anesthetic complications: no   No complications documented.   Last Vitals:  Vitals:   07/03/20 1315 07/03/20 1348  BP:  129/87  Pulse: 69 80  Resp: 18 18  Temp:  36.7 C  SpO2: 100% 98%    Last Pain:  Vitals:   07/03/20 1348  TempSrc: Oral  PainSc: 7                  Rajamani C Battula

## 2020-07-03 NOTE — Interval H&P Note (Signed)
History and Physical Interval Note:  07/03/2020 8:46 AM  Hannah Cooper  has presented today for surgery, with the diagnosis of left rotator cuff tear.  The various methods of treatment have been discussed with the patient and family. After consideration of risks, benefits and other options for treatment, the patient has consented to  Procedure(s): ROTATOR CUFF REPAIR SHOULDER OPEN (Left) as a surgical intervention.  The patient's history has been reviewed, patient examined, no change in status, stable for surgery.  I have reviewed the patient's chart and labs.  Questions were answered to the patient's satisfaction.     Arther Abbott

## 2020-07-03 NOTE — Anesthesia Procedure Notes (Signed)
Procedure Name: Intubation Date/Time: 07/03/2020 9:09 AM Performed by: Myna Bright, CRNA Pre-anesthesia Checklist: Patient identified, Emergency Drugs available, Suction available and Patient being monitored Patient Re-evaluated:Patient Re-evaluated prior to induction Oxygen Delivery Method: Circle system utilized Preoxygenation: Pre-oxygenation with 100% oxygen Induction Type: IV induction Ventilation: Mask ventilation without difficulty Laryngoscope Size: Mac and 4 Grade View: Grade II Tube type: Oral Tube size: 7.5 mm Number of attempts: 1 Airway Equipment and Method: Stylet Placement Confirmation: ETT inserted through vocal cords under direct vision,  positive ETCO2 and breath sounds checked- equal and bilateral Secured at: 21 cm Tube secured with: Tape Dental Injury: Teeth and Oropharynx as per pre-operative assessment

## 2020-07-03 NOTE — Anesthesia Procedure Notes (Signed)
Anesthesia Regional Block: Interscalene brachial plexus block   Pre-Anesthetic Checklist: ,, timeout performed, Correct Patient, Correct Site, Correct Laterality, Correct Procedure, Correct Position, site marked, Risks and benefits discussed,  Surgical consent,  Pre-op evaluation,  At surgeon's request and post-op pain management  Laterality: Upper  Prep: chloraprep       Needles:  Injection technique: Single-shot  Needle Type: Echogenic Stimulator Needle     Needle Length: 8.3cm  Needle Gauge: 22   Needle insertion depth: 5 cm   Additional Needles:   Procedures:, nerve stimulator,,, ultrasound used (permanent image in chart),,,,   Nerve Stimulator or Paresthesia:  Response: no Twitch elicited, 0.5 mA, 0.3 ms, 5 cm  Additional Responses:   Narrative:  Start time: 07/03/2020 7:40 AM End time: 07/03/2020 7:46 AM Injection made incrementally with aspirations every 5 mL.  Performed by: Personally  Anesthesiologist: Denese Killings, MD  Additional Notes: Block assessed prior to start of surgery

## 2020-07-03 NOTE — Op Note (Addendum)
07/03/2020  10:56 AM  PATIENT:  Hannah Cooper  65 y.o. female  PRE-OPERATIVE DIAGNOSIS:  left rotator cuff tear  POST-OPERATIVE DIAGNOSIS:  left rotator cuff tear  PROCEDURE:  Procedure(s): OPEN ROTATOR CUFF REPAIR LEFT SHOULDER  OPEN WITH ACROMIOPLASTY AND  RESECTION DISTAL CLAVICAL   FINDINGS: Torn rotator cuff supraspinatus tendon full-thickness tear.  Delamination was noted.  1-1/2 cm front to back.  It was a U-shaped tear.  There was 1.5cm  cm of retraction.  Hypertrophy of the acromion with grade 4 degenerative arthritis of the distal clavicle and inferior osteophyte.  Implants: Arthrex speed bridge   SURGEON:  Surgeon(s) and Role:    Carole Civil, MD - Primary   Surgery was done in the following manner  Patient was seen in the preop area the surgical site was confirmed and marked his left shoulder.  Chart review was completed including image review.  Implants were available as well.  Preop paracervical block was performed by anesthesia.  Patient was taken to the operating for general anesthesia in the supine position and was placed in a modified beachchair position with appropriate padding and a sandbag under the left scapula  After sterile prep and drape timeout was completed  Examination under anesthesia revealed full range of motion and no instability  The incision was made starting over the acromioclavicular joint and brought anteriorly over the anterior acromion and 4 cm distal to the tip of the acromion.  The subcutaneous tissue was divided and undermined.  The anterior edge of the acromion was found and the deltoid was split between the anterior and middle third carrying the fascial incision up over the [facility] joint preserving the fascial layer.  A bursectomy was then performed and the rotator cuff was easily identifiable.  It was 1.5 cm front to back it was a U-shaped tear,and 1.5 cm retracted ,   there was delamination of the supraspinatus tendon.  We  noted hypertrophy of the acromion with degenerative arthritis of the distal clavicle and an inferior osteophyte  An acromioplasty was performed with an oscillating saw first removing the anterior 5 mm and then using a rasp to contour the anterior and lateral edges until smooth.  The distal clavicle was removed, removing approximately 8 mm of clavicle and then again using a rasp to contour the edges.  Bone wax was then used to cover the ends of the bone  The cuff tendon was then reevaluated and the reduction maneuver was noted to be simple lateral traction there was no adhesions noted.  There was delamination noted.  After preparation of the greater tuberosity with a bur and rasp until a bleeding bone bed was noted to punches were made for medial row and 2 Arthrex anchors were placed.  The sutures were passed and then crisscrossed and then 2 additional punches were made on the lateral humerus and the cuff was reduced and secured laterally with the speed bridge construct  This gave a watertight closure  The wound was irrigated closed with #1 Vicryl for the acromioclavicular fascia followed by drill holes placed in the acromion and two Arthrex FiberWire sutures and then #2 Ethibond to close the deltoid split.  Subcutaneous tissue was closed with 0 Monocryl in running 2-0 Monocryl and benzoin and Steri-Strips were used to close the skin edges  Cryo/Cuff was applied over dressing and the patient was placed in a sling and swathe  After extubation patient was taken recovery room in stable condition.  PHYSICIAN ASSISTANT:  ASSISTANTS: Fulton Mole  ANESTHESIA:   general and paracervical block  EBL:  50 mL   BLOOD ADMINISTERED:none  DRAINS: none   LOCAL MEDICATIONS USED:  MARCAINE     SPECIMEN:  No Specimen  DISPOSITION OF SPECIMEN:  N/A  COUNTS:  YES  TOURNIQUET:  * No tourniquets in log *  DICTATION: .Dragon Dictation  PLAN OF CARE: Admit to inpatient   PATIENT DISPOSITION:   PACU - hemodynamically stable.   No DVT prevention needed.

## 2020-07-03 NOTE — Anesthesia Postprocedure Evaluation (Signed)
Anesthesia Post Note  Patient: Hannah Cooper  Procedure(s) Performed: ROTATOR CUFF REPAIR SHOULDER OPEN WITH CHROMEOPLASTY (Left Shoulder) RESECTION DISTAL CLAVICAL (Left Shoulder)  Patient location during evaluation: PACU Anesthesia Type: General Level of consciousness: awake and alert, oriented and patient cooperative Pain management: pain level controlled Vital Signs Assessment: post-procedure vital signs reviewed and stable Respiratory status: spontaneous breathing and respiratory function stable Cardiovascular status: stable Postop Assessment: no apparent nausea or vomiting Anesthetic complications: no   No complications documented.   Last Vitals:  Vitals:   07/03/20 1315 07/03/20 1348  BP:  129/87  Pulse: 69 80  Resp: 18 18  Temp:  36.7 C  SpO2: 100% 98%    Last Pain:  Vitals:   07/03/20 1348  TempSrc: Oral  PainSc: 7                  Lezlie Ritchey M

## 2020-07-03 NOTE — Brief Op Note (Signed)
07/03/2020  10:56 AM  PATIENT:  Hannah Cooper  65 y.o. female  PRE-OPERATIVE DIAGNOSIS:  left rotator cuff tear  POST-OPERATIVE DIAGNOSIS:  left rotator cuff tear  PROCEDURE:  Procedure(s): OPEN ROTATOR CUFF REPAIR LEFT SHOULDER  OPEN WITH ACROMIOPLASTY AND  RESECTION DISTAL CLAVICAL   FINDINGS: Torn rotator cuff supraspinatus tendon full-thickness tear.  Delamination was noted.  1-1/2 cm front to back.  It was a U-shaped tear.  Hypertrophy of the acromion with grade 4 degenerative arthritis of the distal clavicle and inferior osteophyte.  Implants: Arthrex speed bridge   SURGEON:  Surgeon(s) and Role:    Carole Civil, MD - Primary   Surgery was done in the following manner  Patient was seen in the preop area the surgical site was confirmed and marked his left shoulder.  Chart review was completed including image review.  Implants were available as well.  Preop paracervical block was performed by anesthesia.  Patient was taken to the operating for general anesthesia in the supine position and was placed in a modified beachchair position with appropriate padding and a sandbag under the left scapula  After sterile prep and drape timeout was completed  Examination under anesthesia revealed full range of motion and no instability  The incision was made starting over the acromioclavicular joint and brought anteriorly over the anterior acromion and 4 cm distal to the tip of the acromion.  The subcutaneous tissue was divided and undermined.  The anterior edge of the acromion was found and the deltoid was split between the anterior and middle third carrying the fascial incision up over the [facility] joint preserving the fascial layer.  A bursectomy was then performed and the rotator cuff was easily identifiable.  It was 1 cm and a half front to back it was a U-shaped tear there was delamination of the supraspinatus tendon.  We noted hypertrophy of the acromion with degenerative  arthritis of the distal clavicle and an inferior osteophyte  An acromioplasty was performed with an oscillating saw first removing the anterior 5 mm and then using a rasp to contour the anterior and lateral edges until smooth.  The distal clavicle was removed, removing approximately 8 mm of clavicle and then again using a rasp to contour the edges.  Bone wax was then used to cover the ends of the bone  The cuff tendon was then reevaluated and the reduction maneuver was noted to be simple lateral traction there was no adhesions noted.  There was delamination noted.  After preparation of the greater tuberosity with a bur and rasp until a bleeding bone bed was noted to punches were made for medial row and 2 Arthrex anchors were placed.  The sutures were passed and then crisscrossed and then 2 additional punches were made on the lateral humerus and the cuff was reduced and secured laterally with the speed bridge construct  This gave a watertight closure  The wound was irrigated closed with #1 Vicryl for the acromioclavicular fascia followed by drill holes placed in the acromion and two Arthrex FiberWire sutures and then #2 Ethibond to close the deltoid split.  Subcutaneous tissue was closed with 0 Monocryl in running 2-0 Monocryl and benzoin and Steri-Strips were used to close the skin edges  Cryo/Cuff was applied over dressing and the patient was placed in a sling and swathe  After extubation patient was taken recovery room in stable condition.  PHYSICIAN ASSISTANT:   ASSISTANTS: Fulton Mole  ANESTHESIA:   general and  paracervical block  EBL:  50 mL   BLOOD ADMINISTERED:none  DRAINS: none   LOCAL MEDICATIONS USED:  MARCAINE     SPECIMEN:  No Specimen  DISPOSITION OF SPECIMEN:  N/A  COUNTS:  YES  TOURNIQUET:  * No tourniquets in log *  DICTATION: .Dragon Dictation  PLAN OF CARE: Admit to inpatient   PATIENT DISPOSITION:  PACU - hemodynamically stable.   Delay start of  Pharmacological VTE agent (>24hrs) due to surgical blood loss or risk of bleeding: not applicable

## 2020-07-03 NOTE — Progress Notes (Signed)
Patient states she cannot take Gabapentin due to "Makes me sick". Husband Roselie Awkward would like a call from the surgeon "Because he never contacted me after surgery"  Advised Patient would discuss with Dr. Aline Brochure. Cell number for Husband [phone]

## 2020-07-03 NOTE — Progress Notes (Signed)
Anesthesia block started at [place]. Time out at 0835.  Ended at [phone]

## 2020-07-03 NOTE — Anesthesia Preprocedure Evaluation (Signed)
Anesthesia Evaluation  Patient identified by MRN, date of birth, ID band Patient awake    Reviewed: Allergy & Precautions, NPO status , Patient's Chart, lab work & pertinent test results  Airway Mallampati: II  TM Distance: >3 FB Neck ROM: Full    Dental  (+) Dental Advisory Given, Missing   Pulmonary shortness of breath and with exertion, pneumonia, COPD,  COPD inhaler,    Pulmonary exam normal breath sounds clear to auscultation       Cardiovascular Exercise Tolerance: Poor hypertension, Pt. on medications +CHF  Normal cardiovascular exam+ dysrhythmias (PACs,PVCs)  Rhythm:Regular Rate:Normal     Neuro/Psych  Neuromuscular disease CVA (as per patient never had CVA, left sided weakness after radiation ), Residual Symptoms    GI/Hepatic negative GI ROS, Neg liver ROS,   Endo/Other  negative endocrine ROS  Renal/GU negative Renal ROS     Musculoskeletal  (+) Arthritis  (lumbar radiculopathy),   Abdominal   Peds  Hematology  (+) Blood dyscrasia (bone cancer), ,   Anesthesia Other Findings as per patient never had CVA, left sided weakness after radiation, risk of nerve injury and temporary SOB was explained to the patient and patient agreed to get nerve block for postop pain.  Reproductive/Obstetrics                         Anesthesia Physical Anesthesia Plan  ASA: III  Anesthesia Plan: General   Post-op Pain Management:  Regional for Post-op pain   Induction: Intravenous  PONV Risk Score and Plan: 4 or greater and Ondansetron, Dexamethasone and Midazolam  Airway Management Planned: Oral ETT  Additional Equipment:   Intra-op Plan:   Post-operative Plan: Extubation in OR  Informed Consent: I have reviewed the patients History and Physical, chart, labs and discussed the procedure including the risks, benefits and alternatives for the proposed anesthesia with the patient or authorized  representative who has indicated his/her understanding and acceptance.     Dental advisory given  Plan Discussed with: CRNA and Surgeon  Anesthesia Plan Comments:         Anesthesia Quick Evaluation

## 2020-07-06 ENCOUNTER — Encounter (HOSPITAL_COMMUNITY): Payer: Self-pay | Admitting: Orthopedic Surgery

## 2020-07-07 DIAGNOSIS — Z9889 Other specified postprocedural states: Secondary | ICD-10-CM | POA: Insufficient documentation

## 2020-07-08 ENCOUNTER — Ambulatory Visit (INDEPENDENT_AMBULATORY_CARE_PROVIDER_SITE_OTHER): Payer: No Typology Code available for payment source | Admitting: Orthopedic Surgery

## 2020-07-08 ENCOUNTER — Ambulatory Visit: Payer: No Typology Code available for payment source | Admitting: Orthopedic Surgery

## 2020-07-08 ENCOUNTER — Encounter: Payer: Self-pay | Admitting: Orthopedic Surgery

## 2020-07-08 ENCOUNTER — Other Ambulatory Visit: Payer: Self-pay

## 2020-07-08 DIAGNOSIS — R112 Nausea with vomiting, unspecified: Secondary | ICD-10-CM

## 2020-07-08 DIAGNOSIS — Z9889 Other specified postprocedural states: Secondary | ICD-10-CM

## 2020-07-08 MED ORDER — PROMETHAZINE HCL 12.5 MG PO TABS: 12.5000 mg | ORAL_TABLET | Freq: Four times a day (QID) | ORAL | 1 refills | Status: DC | PRN

## 2020-07-08 NOTE — Addendum Note (Signed)
Addended byCandice Camp on: 07/08/2020 10:15 AM   Modules accepted: Orders

## 2020-07-08 NOTE — Progress Notes (Signed)
POST OP APPT   Chief Complaint  Patient presents with  . Post-op Follow-up    07/03/20 RCR left has nausea    Encounter Diagnosis  Name Primary?  . S/p left rotator cuff repair distal clavicle resection 07/03/20  Yes   Chief Complaint  Patient presents with  . Post-op Follow-up    07/03/20 RCR left has nausea    Meds ordered this encounter  Medications  . promethazine (PHENERGAN) 12.5 MG tablet    Sig: Take 1 tablet (12.5 mg total) by mouth every 6 (six) hours as needed for nausea or vomiting.    Dispense:  60 tablet    Refill:  1    Procedure open rotator cuff repair open acromioplasty open distal clavicle excision  Findings torn rotator cuff supraspinatus tendon full-thickness tear with delamination.  The tear was 1 and a 1-1/2 cm front to back it was 1-1/2 cm of retraction she had hypertrophy of the acromion a grade 4 arthritis of the distal AC joint and a large inferior osteophyte on the acromion  We used a speed bridge Arthrex implant  It was an open technique  Pain seems controlled, wound is good.  Postop nausea and vomiting take Phenergan every 6  Follow-up in a week   Set up outpx therapy

## 2020-07-08 NOTE — Patient Instructions (Signed)
Start therapy   Wear sling and swathe

## 2020-07-09 ENCOUNTER — Ambulatory Visit: Payer: No Typology Code available for payment source | Admitting: Orthopedic Surgery

## 2020-07-15 ENCOUNTER — Encounter: Payer: Self-pay | Admitting: Orthopedic Surgery

## 2020-07-15 ENCOUNTER — Encounter (HOSPITAL_COMMUNITY): Payer: Self-pay

## 2020-07-15 ENCOUNTER — Ambulatory Visit (HOSPITAL_COMMUNITY): Payer: PRIVATE HEALTH INSURANCE | Attending: Orthopedic Surgery

## 2020-07-15 ENCOUNTER — Ambulatory Visit (INDEPENDENT_AMBULATORY_CARE_PROVIDER_SITE_OTHER): Payer: No Typology Code available for payment source | Admitting: Orthopedic Surgery

## 2020-07-15 ENCOUNTER — Other Ambulatory Visit: Payer: Self-pay

## 2020-07-15 DIAGNOSIS — R29898 Other symptoms and signs involving the musculoskeletal system: Secondary | ICD-10-CM | POA: Insufficient documentation

## 2020-07-15 DIAGNOSIS — M75122 Complete rotator cuff tear or rupture of left shoulder, not specified as traumatic: Secondary | ICD-10-CM

## 2020-07-15 DIAGNOSIS — M25512 Pain in left shoulder: Secondary | ICD-10-CM | POA: Insufficient documentation

## 2020-07-15 DIAGNOSIS — G8918 Other acute postprocedural pain: Secondary | ICD-10-CM

## 2020-07-15 DIAGNOSIS — M25612 Stiffness of left shoulder, not elsewhere classified: Secondary | ICD-10-CM

## 2020-07-15 MED ORDER — OXYCODONE-ACETAMINOPHEN 5-325 MG PO TABS
1.0000 | ORAL_TABLET | Freq: Four times a day (QID) | ORAL | 0 refills | Status: AC | PRN
Start: 2020-07-15 — End: 2020-07-22

## 2020-07-15 MED ORDER — IBUPROFEN 800 MG PO TABS: 800.0000 mg | ORAL_TABLET | Freq: Three times a day (TID) | ORAL | 1 refills | Status: DC | PRN

## 2020-07-15 MED ORDER — TIZANIDINE HCL 4 MG PO TABS: 4.0000 mg | ORAL_TABLET | Freq: Every day | ORAL | 1 refills | Status: DC

## 2020-07-15 NOTE — Patient Instructions (Signed)
Continue your sling   Ice   Pain meds ibuprofen and muscle relaxer

## 2020-07-15 NOTE — Therapy (Signed)
[place], [place], [address] Phone: [phone]   Fax:  [phone]  Occupational Therapy Evaluation  Patient Details  Name: CLORIS FLIPPO MRN: [id number] Date of Birth: 04-Aug-1955 Referring Provider (OT): Arther Abbott, MD   Encounter Date: 07/15/2020   OT End of Session - 07/15/20 1822    Visit Number 1    Number of Visits 24    Date for OT Re-Evaluation 10/07/20   mini reassess:09/11/20   Authorization Type 1) [employer] 2) [employer]    Authorization Time Period no copay. no visit limit Fax initial evaluation for medical necessity.    OT Start Time 1657    OT Stop Time 1735    OT Time Calculation (min) 38 min    Activity Tolerance Patient tolerated treatment well    Behavior During Therapy WFL for tasks assessed/performed           Past Medical History:  Diagnosis Date  . Arthritis   . Atypical chest pain    chronic  . Cancer ([place])    bone cancer  . Chronic back pain   . Chronic diastolic CHF (congestive heart failure) ([place])   . COPD (chronic obstructive pulmonary disease) ([place])   . DDD (degenerative disc disease), cervical   . History of radiation therapy 07/16/19-08/22/19   L4 spine ; Dr. Gery Pray  . Hyperlipidemia   . Hypertension   . Lumbar radiculopathy   . Meningitis   . Normal coronary arteries 2014  . Palpitations   . Premature atrial contractions   . PVC's (premature ventricular contractions)     Past Surgical History:  Procedure Laterality Date  . ABDOMINAL HYSTERECTOMY    . IR FLUORO GUIDED NEEDLE PLC ASPIRATION/INJECTION LOC  06/19/2019  . LEFT HEART CATHETERIZATION WITH CORONARY ANGIOGRAM N/A 09/05/2012   Procedure: LEFT HEART CATHETERIZATION WITH CORONARY ANGIOGRAM;  Surgeon: Wellington Hampshire, MD;  Location: [facility];  Service: Cardiovascular;  Laterality: N/A;  . RESECTION DISTAL CLAVICAL Left 07/03/2020   Procedure: RESECTION DISTAL CLAVICAL;   Surgeon: Carole Civil, MD;  Location: [facility];  Service: Orthopedics;  Laterality: Left;  . SHOULDER OPEN ROTATOR CUFF REPAIR Left 07/03/2020   Procedure: ROTATOR CUFF REPAIR SHOULDER OPEN WITH CHROMEOPLASTY;  Surgeon: Carole Civil, MD;  Location: [facility];  Service: Orthopedics;  Laterality: Left;  . THYROID SURGERY  2002    There were no vitals filed for this visit.   Subjective Assessment - 07/15/20 1703    Subjective  S: It's really sore right now.    Pertinent History Patient is a 21 7/o female S/P left open RTC repair and distal clavicle acromioplasty resection which was completed on 07/03/20. Dr. Aline Brochure has referred patient to occupational therapy for evaluationand treatment.    Currently in Pain? Yes    Pain Score 9     Pain Location Shoulder    Pain Orientation Left    Pain Descriptors / Indicators Stabbing    Pain Type Surgical pain    Pain Onset 1 to 4 weeks ago    Pain Frequency Constant    Aggravating Factors  wrong movement/jerking movement    Pain Relieving Factors pain meds, ice    Effect of Pain on Daily Activities Patient is unable to utilize her LUE for ADL tasks.             [facility] OT Assessment - 07/15/20 1704      Assessment   Medical  Diagnosis Left RTC repair    Referring Provider (OT) Arther Abbott, MD    Onset Date/Surgical Date 07/03/20    Hand Dominance Right    Next MD Visit 08/13/20    Prior Therapy None for this surgery.      Precautions   Precautions Shoulder;Other (comment)    Type of Shoulder Precautions Standard RCR protocol   Sling on 4-6 weeks   0-6 weeks (3/18-4/29)-: PROM, pendulum   6-12 weeks (4/29-6/10): AAROM progressing to AROM   12-24 weeks: Strengthening exercises    Shoulder Interventions Shoulder sling/immobilizer;At all times;Off for dressing/bathing/exercises    Precaution Comments History of Plasmocytoma.      Restrictions   Weight Bearing Restrictions Yes    LUE Weight Bearing Non weight bearing      Balance  Screen   Has the patient fallen in the past 6 months No      Home  Environment   Family/patient expects to be discharged to: Private residence    Living Arrangements Spouse/significant other   4 young daughters     Prior Function   Level of Independence Independent;Independent with basic ADLs;Independent with household mobility with device    Vocation Retired      ADL   ADL comments Unable to utilize her LUE for any daily tasks.      Mobility   Mobility Status Independent      Written Expression   Dominant Hand Right      Vision - History   Baseline Vision No visual deficits      Cognition   Overall Cognitive Status Within Functional Limits for tasks assessed      Observation/Other Assessments   Focus on Therapeutic Outcomes (FOTO)  Complete next session.      Posture/Postural Control   Posture/Postural Control Postural limitations    Postural Limitations Rounded Shoulders;Forward head      ROM / Strength   AROM / PROM / Strength AROM;PROM;Strength      Palpation   Palpation comment max fascial restrictions in left upper arm, trapezius, and scapularis region.      AROM   Overall AROM  Unable to assess;Due to precautions      PROM   Overall PROM Comments Assessed supine. IR/er adducted.    PROM Assessment Site Shoulder    Right/Left Shoulder Left    Left Shoulder Flexion 40 Degrees    Left Shoulder ABduction 35 Degrees    Left Shoulder Internal Rotation 90 Degrees    Left Shoulder External Rotation -15 Degrees      Strength   Overall Strength Unable to assess;Due to precautions                           OT Education - 07/15/20 1822    Education provided Yes    Education Details A/ROM wrist, forearm, and elbow. Seated scapular A/ROM (elevation and row)    Person(s) Educated Patient    Methods Explanation;Demonstration;Verbal cues;Handout    Comprehension Returned demonstration;Verbalized understanding;Verbal cues required;Need further  instruction            OT Short Term Goals - 07/15/20 1837      OT SHORT TERM GOAL #1   Title Patient will be educated and independent with HEP in order to facilitate her progress in therapy and begin to use her LUE as her non dominant extremity for 50% or more of daily tasks.    Time 6    Period  Weeks    Status New    Target Date 08/26/20      OT SHORT TERM GOAL #2   Title patient will increase her LUE P/ROM to [facility] in order to increase ability to complete upper body dressing tasks.    Time 6    Period Weeks    Status New      OT SHORT TERM GOAL #3   Title Patient will increase her LUE strength to 3/5 in order to complete functional waist level self care tasks with less difficulty.    Time 6    Period Weeks    Status New      OT SHORT TERM GOAL #4   Title Patient will report a decrease in pain for her LUE of approximately 6/10 or less while completing dressing and bathing tasks.    Time 6    Period Weeks    Status New      OT SHORT TERM GOAL #5   Title Patient will decrease LUE fascial restrictions to moderate amount in order to increase her functional mobility needed to complete low level reaching tasks.    Time 6    Period Weeks    Status New             OT Long Term Goals - 07/15/20 1841      OT LONG TERM GOAL #1   Title Patient will increase her LUE A/ROM to [facility] in order to complete reaching tasks at or above her shoulder level.    Time 12    Period Weeks    Status New    Target Date 10/07/20      OT LONG TERM GOAL #2   Title Patient will increase her LUE strength to 4/5 or better in order to return to household lifting tasks while using her LUE.    Time 12    Period Weeks    Status New      OT LONG TERM GOAL #3   Title Patient will report a decrease in pain level in her LUE to 3/10 or less while completing daily tasks.    Time 12    Period Weeks    Status New      OT LONG TERM GOAL #4   Title Patient will decrease her LUE fascial restrictions to  min amount or less in order to increase the functional mobility needed to complete reaching tasks.    Time 12    Period Weeks    Status New                 Plan - 07/15/20 1834    Clinical Impression Statement A: Patient is a 65 y/o female S/P left RTC repair causing increased pain, fascial restrictions, and decreased ROM and strength resulting in the inability to utilize her LUE to complete any needed ADL tasks.    OT Occupational Profile and History Problem Focused Assessment - Including review of records relating to presenting problem    Occupational performance deficits (Please refer to evaluation for details): IADL's;ADL's;Rest and Sleep;Leisure    Body Structure / Function / Physical Skills ADL;UE functional use;Fascial restriction;Pain;ROM;Decreased knowledge of precautions;Strength;Mobility    Rehab Potential Excellent    Clinical Decision Making Several treatment options, min-mod task modification necessary    Comorbidities Affecting Occupational Performance: Presence of comorbidities impacting occupational performance    Comorbidities impacting occupational performance description: History of plasmocytoma.    Modification or Assistance to Complete Evaluation  Max significant modification  of tasks or assist is necessary to complete    OT Frequency 2x / week    OT Duration 12 weeks    OT Treatment/Interventions Self-care/ADL training;Ultrasound;Patient/family education;DME and/or AE instruction;Passive range of motion;Cryotherapy;Electrical Stimulation;Moist Heat;Neuromuscular education;Therapeutic exercise;Manual Therapy;Therapeutic activities    Plan P: Patient will benefit from skilled OT services to increase functional use of her LUE and allow her to return to using it as her non dominant extremity. Treatment Plan: Follow protocol. myofascial release, manual stretching, P/ROM, AA/ROM, A/ROM, general shoulder and scapular strengthening. Modalities PRN. Next treatment session:  complete FOTO. support elbow under 2 towels. Very gentle myofascial release, very gentle passive ROM. Continue to work on relaxation techniques to decrease muscle guarding and pain to increase tolerance to P/ROM.    OT Home Exercise Plan eval: A/ROM elbow, wrist, forearm, seated A/ROM scapular exercises.    Consulted and Agree with Plan of Care Patient           Patient will benefit from skilled therapeutic intervention in order to improve the following deficits and impairments:   Body Structure / Function / Physical Skills: ADL,UE functional use,Fascial restriction,Pain,ROM,Decreased knowledge of precautions,Strength,Mobility       Visit Diagnosis: Other symptoms and signs involving the musculoskeletal system - Plan: Ot plan of care cert/re-cert  Acute pain of left shoulder - Plan: Ot plan of care cert/re-cert  Stiffness of left shoulder, not elsewhere classified - Plan: Ot plan of care cert/re-cert    Problem List Patient Active Problem List   Diagnosis Date Noted  . S/p left rotator cuff repair distal clavicle resection 07/03/20  07/07/2020  . Complete tear of left rotator cuff   . 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[place]) 07/10/2019  . Plasma cell disorder 06/10/2019  . Lesion of bone of lumbosacral spine 05/28/2019  . Left-sided weakness 03/13/2016  . Cerebrovascular accident (CVA) ([place])   . Palpitations   . COPD (chronic obstructive pulmonary disease) ([place]) 08/20/2014  . Dyspnea 12/19/2013  . Chronic diastolic CHF (congestive heart failure) ([place]) 07/05/2013  . Lower extremity edema 07/05/2013  . Hyperlipidemia   . Chest pain 07/04/2013  . Hypokalemia 09/04/2012  . Precordial pain 09/04/2012  . Viral meningitis 01/22/2011  . Vomiting 01/22/2011  . PNEUMONIA, LEFT LOWER LOBE 10/10/2006  . DISEASE, ACUTE BRONCHOSPASM 10/10/2006  . Essential hypertension 05/23/2006  . OSTEOARTHRITIS 05/23/2006   Ailene Ravel, OTR/L,CBIS  [phone]  07/15/2020, 6:45  PM  [facility] [address] [place], [place], [address] Phone: [phone]   Fax:  [phone]  Name: FELITA BUMP MRN: [id number] Date of Birth: [birth year]

## 2020-07-15 NOTE — Progress Notes (Signed)
Chief Complaint  Patient presents with  . Post-op Follow-up    Left shoulder 07/03/20     Hannah Cooper had a open rotator cuff repair acromioplasty and distal clavicle resection she is postop day 12 she is here for wound check and general postop evaluation  She is doing well but complains of severe pain still at this point  Her suture line looks good there is no excessive swelling she is neurovascularly intact  I encouraged her to hang in there.  She was reluctant to start therapy but I told her that we got her get moving  She had an excellent repair with the speed bridge we got a good watertight seal and she should be able to progress fairly rapidly  Follow-up in 4 weeks medications were refilled 07/03/2020   PRE-OPERATIVE DIAGNOSIS:  left rotator cuff tear  POST-OPERATIVE DIAGNOSIS:  left rotator cuff tear  PROCEDURE:  Procedure(s): OPEN ROTATOR CUFF REPAIR LEFT SHOULDER  OPEN WITH ACROMIOPLASTY AND  RESECTION DISTAL CLAVICAL   FINDINGS: Torn rotator cuff supraspinatus tendon full-thickness tear.  Delamination was noted.  1-1/2 cm front to back.  It was a U-shaped tear.  Hypertrophy of the acromion with grade 4 degenerative arthritis of the distal clavicle and inferior osteophyte.  Implants: Arthrex speed bridge  Meds:  Meds ordered this encounter  Medications  . oxyCODONE-acetaminophen (PERCOCET/ROXICET) 5-325 MG tablet    Sig: Take 1 tablet by mouth every 6 (six) hours as needed for up to 7 days for severe pain.    Dispense:  28 tablet    Refill:  0  . ibuprofen (ADVIL) 800 MG tablet    Sig: Take 1 tablet (800 mg total) by mouth every 8 (eight) hours as needed.    Dispense:  90 tablet    Refill:  1  . tiZANidine (ZANAFLEX) 4 MG tablet    Sig: Take 1 tablet (4 mg total) by mouth daily.    Dispense:  30 tablet    Refill:  1

## 2020-07-15 NOTE — Patient Instructions (Signed)
1) Seated Row   Sit up straight with elbows by your sides. Pull back with shoulders/elbows, keeping forearms straight, as if pulling back on the reins of a horse. Squeeze shoulder blades together. Repeat _10__times, __2-3__sets/day    2) Shoulder Elevation    Sit up straight with arms by your sides. Slowly bring your shoulders up towards your ears. Repeat_10__times, __2-3__ sets/day      Copyright  VHI. All rights reserved.  AROM: Wrist Extension   With right palm down, bend wrist up. Repeat 10____ times per set. Do ____ sets per session. Do __3__ sessions per day.  Copyright  VHI. All rights reserved.   AROM: Wrist Flexion   With right palm up, bend wrist up. Repeat ___10_ times per set. Do ____ sets per session. Do __3__ sessions per day.  Copyright  VHI. All rights reserved.   AROM: Forearm Pronation / Supination   With right arm in handshake position, slowly rotate palm down until stretch is felt. Relax. Then rotate palm up until stretch is felt. Repeat __10__ times per set. Do ____ sets per session. Do __3__ sessions per day.  Copyright  VHI. All rights reserved.   AFlexion (Passive)   Use other hand to bend elbow, with thumb toward same shoulder. Do NOT force this motion. Hold ____ seconds. Repeat __10__ times. Do __3__ sessions per day.

## 2020-07-16 ENCOUNTER — Other Ambulatory Visit: Payer: Self-pay | Admitting: Cardiology

## 2020-07-17 ENCOUNTER — Ambulatory Visit (HOSPITAL_COMMUNITY): Payer: PRIVATE HEALTH INSURANCE | Attending: Orthopedic Surgery | Admitting: Specialist

## 2020-07-17 ENCOUNTER — Encounter (HOSPITAL_COMMUNITY): Payer: Self-pay | Admitting: Specialist

## 2020-07-17 ENCOUNTER — Other Ambulatory Visit: Payer: Self-pay

## 2020-07-17 DIAGNOSIS — M25612 Stiffness of left shoulder, not elsewhere classified: Secondary | ICD-10-CM | POA: Diagnosis not present

## 2020-07-17 DIAGNOSIS — M25512 Pain in left shoulder: Secondary | ICD-10-CM | POA: Insufficient documentation

## 2020-07-17 DIAGNOSIS — R29898 Other symptoms and signs involving the musculoskeletal system: Secondary | ICD-10-CM | POA: Diagnosis not present

## 2020-07-17 NOTE — Therapy (Signed)
[place], [place], [address] Phone: [phone]   Fax:  [phone]  Occupational Therapy Treatment  Patient Details  Name: Hannah Cooper MRN: [id number] Date of Birth: 1955/08/05 Referring Provider (OT): Arther Abbott, MD   Encounter Date: 07/17/2020   OT End of Session - 07/17/20 1210    Visit Number 2    Number of Visits 24    Date for OT Re-Evaluation 10/07/20   mini reassess on 09/11/20   Authorization Type 1) [employer] 2) [employer]    Authorization Time Period no copay. no visit limit Fax initial evaluation for medical necessity.    Progress Note Due on Visit 10    OT Start Time [phone]    OT Stop Time 1030    OT Time Calculation (min) 40 min    Activity Tolerance Patient tolerated treatment well    Behavior During Therapy WFL for tasks assessed/performed           Past Medical History:  Diagnosis Date  . Arthritis   . Atypical chest pain    chronic  . Cancer ([place])    bone cancer  . Chronic back pain   . Chronic diastolic CHF (congestive heart failure) ([place])   . COPD (chronic obstructive pulmonary disease) ([place])   . DDD (degenerative disc disease), cervical   . History of radiation therapy 07/16/19-08/22/19   L4 spine ; Dr. Gery Pray  . Hyperlipidemia   . Hypertension   . Lumbar radiculopathy   . Meningitis   . Normal coronary arteries 2014  . Palpitations   . Premature atrial contractions   . PVC's (premature ventricular contractions)     Past Surgical History:  Procedure Laterality Date  . ABDOMINAL HYSTERECTOMY    . IR FLUORO GUIDED NEEDLE PLC ASPIRATION/INJECTION LOC  06/19/2019  . LEFT HEART CATHETERIZATION WITH CORONARY ANGIOGRAM N/A 09/05/2012   Procedure: LEFT HEART CATHETERIZATION WITH CORONARY ANGIOGRAM;  Surgeon: Wellington Hampshire, MD;  Location: [facility] C[facility];  Service: Cardiovascular;  Laterality: N/A;  . RESECTION DISTAL CLAVICAL Left 07/03/2020   Procedure:  RESECTION DISTAL CLAVICAL;  Surgeon: Carole Civil, MD;  Location: [facility];  Service: Orthopedics;  Laterality: Left;  . SHOULDER OPEN ROTATOR CUFF REPAIR Left 07/03/2020   Procedure: ROTATOR CUFF REPAIR SHOULDER OPEN WITH CHROMEOPLASTY;  Surgeon: Carole Civil, MD;  Location: [facility];  Service: Orthopedics;  Laterality: Left;  . THYROID SURGERY  2002    There were no vitals filed for this visit.   Subjective Assessment - 07/17/20 1208    Subjective  S:  I dont want to do anything to injure it.    Currently in Pain? Yes    Pain Score 5     Pain Location Shoulder    Pain Orientation Left    Pain Descriptors / Indicators Aching;Stabbing    Pain Type Surgical pain    Pain Onset 1 to 4 weeks ago    Pain Frequency Constant    Aggravating Factors  any movement    Pain Relieving Factors medication and ice              [facility] OT Assessment - 07/17/20 0001      Assessment   Medical Diagnosis Left RTC repair    Referring Provider (OT) Arther Abbott, MD    Onset Date/Surgical Date 07/03/20      Precautions   Precautions Shoulder;Other (comment)    Type of Shoulder Precautions Standard RCR  protocol   Sling on 4-6 weeks   0-6 weeks (3/18-4/29)-: PROM, pendulum   6-12 weeks (4/29-6/10): AAROM progressing to AROM   12-24 weeks: Strengthening exercises    Shoulder Interventions Shoulder sling/immobilizer;At all times;Off for dressing/bathing/exercises    Precaution Comments History of Plasmocytoma.                    OT Treatments/Exercises (OP) - 07/17/20 0001      Exercises   Exercises Shoulder      Shoulder Exercises: Supine   External Rotation PROM;5 reps    Internal Rotation PROM;5 reps    Flexion PROM;5 reps    ABduction PROM;5 reps    Other Supine Exercises passive range of elbow flexion/extension, supination/pronation, and wrist flexion/extension 10 times      Shoulder Exercises: Seated   Elevation AAROM;10 reps    Extension AAROM;10 reps    Row  AAROM;10 reps      Manual Therapy   Manual Therapy Myofascial release    Manual therapy comments manual therapy completed seperately from all other interventions this date    Myofascial Release myofascial release and manual stretching to left upper arm, scapular, shoulder, and elbow region                  OT Education - 07/17/20 1208    Education provided Yes    Education Details reviewed HEP and encouraged patient to relax and decrease tension in left shoulder region frequently throughout her day    Person(s) Educated Patient    Methods Explanation;Demonstration;Verbal cues;Handout    Comprehension Returned demonstration;Verbalized understanding;Verbal cues required;Need further instruction            OT Short Term Goals - 07/17/20 1213      OT SHORT TERM GOAL #1   Title Patient will be educated and independent with HEP in order to facilitate her progress in therapy and begin to use her LUE as her non dominant extremity for 50% or more of daily tasks.    Time 6    Period Weeks    Status On-going    Target Date 08/26/20      OT SHORT TERM GOAL #2   Title patient will increase her LUE P/ROM to [facility] in order to increase ability to complete upper body dressing tasks.    Time 6    Period Weeks    Status On-going      OT SHORT TERM GOAL #3   Title Patient will increase her LUE strength to 3/5 in order to complete functional waist level self care tasks with less difficulty.    Time 6    Period Weeks    Status On-going      OT SHORT TERM GOAL #4   Title Patient will report a decrease in pain for her LUE of approximately 6/10 or less while completing dressing and bathing tasks.    Time 6    Period Weeks    Status On-going      OT SHORT TERM GOAL #5   Title Patient will decrease LUE fascial restrictions to moderate amount in order to increase her functional mobility needed to complete low level reaching tasks.    Time 6    Period Weeks    Status On-going              OT Long Term Goals - 07/17/20 1213      OT LONG TERM GOAL #1   Title Patient will increase her  LUE A/ROM to [facility] in order to complete reaching tasks at or above her shoulder level.    Time 12    Period Weeks    Status On-going      OT LONG TERM GOAL #2   Title Patient will increase her LUE strength to 4/5 or better in order to return to household lifting tasks while using her LUE.    Time 12    Period Weeks    Status On-going      OT LONG TERM GOAL #3   Title Patient will report a decrease in pain level in her LUE to 3/10 or less while completing daily tasks.    Time 12    Period Weeks    Status On-going      OT LONG TERM GOAL #4   Title Patient will decrease her LUE fascial restrictions to min amount or less in order to increase the functional mobility needed to complete reaching tasks.    Time 12    Period Weeks    Status On-going                 Plan - 07/17/20 1210    Clinical Impression Statement A:  Patient extremely guarded throughout treatment session.  requiring max vg to relax arm throughout passive range of motion.  Patient tolerated approximately 10-15 degrees of passive range into flexion and abduction and could tolerate from internal rotation to neutral only with no external rotation.  with shoulder elevation and row patient requires max facilitation for 50% of range.    Body Structure / Function / Physical Skills ADL;UE functional use;Fascial restriction;Pain;ROM;Decreased knowledge of precautions;Strength;Mobility    Plan P:  complete FOTO, continue manual therapy as tolerated and trial various relaxation techniques during manual therapy and passive range of motion.           Patient will benefit from skilled therapeutic intervention in order to improve the following deficits and impairments:   Body Structure / Function / Physical Skills: ADL,UE functional use,Fascial restriction,Pain,ROM,Decreased knowledge of precautions,Strength,Mobility        Visit Diagnosis: Other symptoms and signs involving the musculoskeletal system  Acute pain of left shoulder  Stiffness of left shoulder, not elsewhere classified    Problem List Patient Active Problem List   Diagnosis Date Noted  . S/p left rotator cuff repair distal clavicle resection 07/03/20  07/07/2020  . Complete tear of left rotator cuff   . 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[place]) 07/10/2019  . Plasma cell disorder 06/10/2019  . Lesion of bone of lumbosacral spine 05/28/2019  . Left-sided weakness 03/13/2016  . Cerebrovascular accident (CVA) ([place])   . Palpitations   . COPD (chronic obstructive pulmonary disease) ([place]) 08/20/2014  . Dyspnea 12/19/2013  . Chronic diastolic CHF (congestive heart failure) ([place]) 07/05/2013  . Lower extremity edema 07/05/2013  . Hyperlipidemia   . Chest pain 07/04/2013  . Hypokalemia 09/04/2012  . Precordial pain 09/04/2012  . Viral meningitis 01/22/2011  . Vomiting 01/22/2011  . PNEUMONIA, LEFT LOWER LOBE 10/10/2006  . DISEASE, ACUTE BRONCHOSPASM 10/10/2006  . Essential hypertension 05/23/2006  . OSTEOARTHRITIS 05/23/2006    Vangie Bicker, [place], OTR/L [phone]  07/17/2020, 12:18 PM  [facility] [address] [place], [place], [address] Phone: [phone]   Fax:  [phone]  Name: Hannah Cooper MRN: [id number] Date of Birth: Oct 18, 1955

## 2020-07-21 ENCOUNTER — Ambulatory Visit (HOSPITAL_COMMUNITY): Payer: PRIVATE HEALTH INSURANCE | Admitting: Occupational Therapy

## 2020-07-21 ENCOUNTER — Other Ambulatory Visit: Payer: Self-pay

## 2020-07-21 ENCOUNTER — Encounter (HOSPITAL_COMMUNITY): Payer: Self-pay | Admitting: Occupational Therapy

## 2020-07-21 DIAGNOSIS — R29898 Other symptoms and signs involving the musculoskeletal system: Secondary | ICD-10-CM | POA: Diagnosis not present

## 2020-07-21 DIAGNOSIS — M25512 Pain in left shoulder: Secondary | ICD-10-CM

## 2020-07-21 DIAGNOSIS — M25612 Stiffness of left shoulder, not elsewhere classified: Secondary | ICD-10-CM

## 2020-07-21 NOTE — Therapy (Signed)
[place], [place], [address] Phone: [phone]   Fax:  [phone]  Occupational Therapy Treatment  Patient Details  Name: Hannah Cooper MRN: [id number] Date of Birth: 1955/10/26 Referring Provider (OT): Arther Abbott, MD   Encounter Date: 07/21/2020   OT End of Session - 07/21/20 1206    Visit Number 3    Number of Visits 24    Date for OT Re-Evaluation 10/07/20   mini reassess on 09/11/20   Authorization Type 1) [employer] 2) [employer]    Authorization Time Period no copay. no visit limit Fax initial evaluation for medical necessity.    Progress Note Due on Visit 10    OT Start Time 1119    OT Stop Time 1204    OT Time Calculation (min) 45 min           Past Medical History:  Diagnosis Date  . Arthritis   . Atypical chest pain    chronic  . Cancer ([place])    bone cancer  . Chronic back pain   . Chronic diastolic CHF (congestive heart failure) ([place])   . COPD (chronic obstructive pulmonary disease) ([place])   . DDD (degenerative disc disease), cervical   . History of radiation therapy 07/16/19-08/22/19   L4 spine ; Dr. Gery Pray  . Hyperlipidemia   . Hypertension   . Lumbar radiculopathy   . Meningitis   . Normal coronary arteries 2014  . Palpitations   . Premature atrial contractions   . PVC's (premature ventricular contractions)     Past Surgical History:  Procedure Laterality Date  . ABDOMINAL HYSTERECTOMY    . IR FLUORO GUIDED NEEDLE PLC ASPIRATION/INJECTION LOC  06/19/2019  . LEFT HEART CATHETERIZATION WITH CORONARY ANGIOGRAM N/A 09/05/2012   Procedure: LEFT HEART CATHETERIZATION WITH CORONARY ANGIOGRAM;  Surgeon: Wellington Hampshire, MD;  Location: [facility];  Service: Cardiovascular;  Laterality: N/A;  . RESECTION DISTAL CLAVICAL Left 07/03/2020   Procedure: RESECTION DISTAL CLAVICAL;  Surgeon: Carole Civil, MD;  Location: [facility];  Service: Orthopedics;  Laterality:  Left;  . SHOULDER OPEN ROTATOR CUFF REPAIR Left 07/03/2020   Procedure: ROTATOR CUFF REPAIR SHOULDER OPEN WITH CHROMEOPLASTY;  Surgeon: Carole Civil, MD;  Location: [facility];  Service: Orthopedics;  Laterality: Left;  . THYROID SURGERY  2002    There were no vitals filed for this visit.   Subjective Assessment - 07/21/20 1119    Subjective  S: I took some ibuprofen before I came.    Currently in Pain? Yes    Pain Score 6     Pain Location Shoulder    Pain Orientation Left    Pain Descriptors / Indicators Aching;Sore    Pain Type Acute pain    Pain Radiating Towards down arm    Pain Onset 1 to 4 weeks ago    Pain Frequency Constant    Aggravating Factors  movement    Pain Relieving Factors medication, ice    Effect of Pain on Daily Activities unable to use LUE for ADLs    Multiple Pain Sites No              [facility] OT Assessment - 07/21/20 1118      Assessment   Medical Diagnosis Left RTC repair      Precautions   Precautions Shoulder;Other (comment)    Type of Shoulder Precautions Standard RCR protocol   Sling on 4-6 weeks   0-6  weeks (3/18-4/29)-: PROM, pendulum   6-12 weeks (4/29-6/10): AAROM progressing to AROM   12-24 weeks: Strengthening exercises    Shoulder Interventions Shoulder sling/immobilizer;At all times;Off for dressing/bathing/exercises    Precaution Comments History of Plasmocytoma.      Observation/Other Assessments   Focus on Therapeutic Outcomes (FOTO)  13/100                    OT Treatments/Exercises (OP) - 07/21/20 1124      Exercises   Exercises Shoulder      Shoulder Exercises: Supine   External Rotation PROM;10 reps    Internal Rotation PROM;10 reps    Flexion PROM;10 reps    ABduction PROM;10 reps    Other Supine Exercises passive range of elbow flexion/extension, supination/pronation, 10X      Shoulder Exercises: Seated   Elevation AROM;10 reps    Extension AROM;10 reps    Row AROM;10 reps      Shoulder Exercises:  ROM/Strengthening   Pendulum pt standing bent at waist, letting arm hang down, 1'      Manual Therapy   Manual Therapy Myofascial release    Manual therapy comments manual therapy completed seperately from all other interventions this date    Myofascial Release myofascial release and manual stretching to left upper arm, scapular, shoulder, and elbow region                    OT Short Term Goals - 07/17/20 1213      OT SHORT TERM GOAL #1   Title Patient will be educated and independent with HEP in order to facilitate her progress in therapy and begin to use her LUE as her non dominant extremity for 50% or more of daily tasks.    Time 6    Period Weeks    Status On-going    Target Date 08/26/20      OT SHORT TERM GOAL #2   Title patient will increase her LUE P/ROM to [facility] in order to increase ability to complete upper body dressing tasks.    Time 6    Period Weeks    Status On-going      OT SHORT TERM GOAL #3   Title Patient will increase her LUE strength to 3/5 in order to complete functional waist level self care tasks with less difficulty.    Time 6    Period Weeks    Status On-going      OT SHORT TERM GOAL #4   Title Patient will report a decrease in pain for her LUE of approximately 6/10 or less while completing dressing and bathing tasks.    Time 6    Period Weeks    Status On-going      OT SHORT TERM GOAL #5   Title Patient will decrease LUE fascial restrictions to moderate amount in order to increase her functional mobility needed to complete low level reaching tasks.    Time 6    Period Weeks    Status On-going             OT Long Term Goals - 07/17/20 1213      OT LONG TERM GOAL #1   Title Patient will increase her LUE A/ROM to [facility] in order to complete reaching tasks at or above her shoulder level.    Time 12    Period Weeks    Status On-going      OT LONG TERM GOAL #2   Title  Patient will increase her LUE strength to 4/5 or better in order to  return to household lifting tasks while using her LUE.    Time 12    Period Weeks    Status On-going      OT LONG TERM GOAL #3   Title Patient will report a decrease in pain level in her LUE to 3/10 or less while completing daily tasks.    Time 12    Period Weeks    Status On-going      OT LONG TERM GOAL #4   Title Patient will decrease her LUE fascial restrictions to min amount or less in order to increase the functional mobility needed to complete reaching tasks.    Time 12    Period Weeks    Status On-going                 Plan - 07/21/20 1154    Clinical Impression Statement A: Pt continues to be very guarded throughout session, OT educating on each task and reassuring pt that we are not completing any exercise that will damage the surgery site or muscles. Added pendulum hang today, no movement just letting gravity pull the arm down. Pt completing scapular A/ROM, becoming more confident and wiht more fluid movement with practice. Verbal cuing for form and technique throughout session.    Body Structure / Function / Physical Skills ADL;UE functional use;Fascial restriction;Pain;ROM;Decreased knowledge of precautions;Strength;Mobility    Plan P:  continue manual therapy as tolerated and trial various relaxation techniques during manual therapy and passive range of motion. Work on pendulums and adding movement    OT Home Exercise Plan eval: A/ROM elbow, wrist, forearm, seated A/ROM scapular exercises.    Consulted and Agree with Plan of Care Patient           Patient will benefit from skilled therapeutic intervention in order to improve the following deficits and impairments:   Body Structure / Function / Physical Skills: ADL,UE functional use,Fascial restriction,Pain,ROM,Decreased knowledge of precautions,Strength,Mobility       Visit Diagnosis: Other symptoms and signs involving the musculoskeletal system  Acute pain of left shoulder  Stiffness of left shoulder, not  elsewhere classified    Problem List Patient Active Problem List   Diagnosis Date Noted  . S/p left rotator cuff repair distal clavicle resection 07/03/20  07/07/2020  . Complete tear of left rotator cuff   . 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[place]) 07/10/2019  . Plasma cell disorder 06/10/2019  . Lesion of bone of lumbosacral spine 05/28/2019  . Left-sided weakness 03/13/2016  . Cerebrovascular accident (CVA) ([place])   . Palpitations   . COPD (chronic obstructive pulmonary disease) ([place]) 08/20/2014  . Dyspnea 12/19/2013  . Chronic diastolic CHF (congestive heart failure) ([place]) 07/05/2013  . Lower extremity edema 07/05/2013  . Hyperlipidemia   . Chest pain 07/04/2013  . Hypokalemia 09/04/2012  . Precordial pain 09/04/2012  . Viral meningitis 01/22/2011  . Vomiting 01/22/2011  . PNEUMONIA, LEFT LOWER LOBE 10/10/2006  . DISEASE, ACUTE BRONCHOSPASM 10/10/2006  . Essential hypertension 05/23/2006  . OSTEOARTHRITIS 05/23/2006   Guadelupe Sabin, OTR/L  [phone] 07/21/2020, 12:07 PM  [place] [address] [place], [place], [address] Phone: [phone]   Fax:  [phone]  Name: ELANE PEABODY MRN: [id number] Date of Birth: 06-Oct-1955

## 2020-07-22 ENCOUNTER — Ambulatory Visit (HOSPITAL_COMMUNITY): Payer: PRIVATE HEALTH INSURANCE | Admitting: Occupational Therapy

## 2020-07-22 ENCOUNTER — Encounter (HOSPITAL_COMMUNITY): Payer: Self-pay | Admitting: Occupational Therapy

## 2020-07-22 DIAGNOSIS — M25512 Pain in left shoulder: Secondary | ICD-10-CM

## 2020-07-22 DIAGNOSIS — M25612 Stiffness of left shoulder, not elsewhere classified: Secondary | ICD-10-CM

## 2020-07-22 DIAGNOSIS — R29898 Other symptoms and signs involving the musculoskeletal system: Secondary | ICD-10-CM | POA: Diagnosis not present

## 2020-07-22 NOTE — Therapy (Signed)
[place], [place], [address] Phone: [phone]   Fax:  [phone]  Occupational Therapy Treatment  Patient Details  Name: Hannah Cooper MRN: [id number] Date of Birth: 1956-01-07 Referring Provider (OT): Arther Abbott, MD   Encounter Date: 07/22/2020   OT End of Session - 07/22/20 0947    Visit Number 4    Number of Visits 24    Date for OT Re-Evaluation 10/07/20   mini reassess on 09/11/20   Authorization Type 1) [employer] 2) [employer]    Authorization Time Period no copay. no visit limit Fax initial evaluation for medical necessity.    Progress Note Due on Visit 10    OT Start Time 0902    OT Stop Time 0945    OT Time Calculation (min) 43 min    Activity Tolerance Patient tolerated treatment well    Behavior During Therapy WFL for tasks assessed/performed           Past Medical History:  Diagnosis Date  . Arthritis   . Atypical chest pain    chronic  . Cancer ([place])    bone cancer  . Chronic back pain   . Chronic diastolic CHF (congestive heart failure) ([place])   . COPD (chronic obstructive pulmonary disease) ([place])   . DDD (degenerative disc disease), cervical   . History of radiation therapy 07/16/19-08/22/19   L4 spine ; Dr. Gery Pray  . Hyperlipidemia   . Hypertension   . Lumbar radiculopathy   . Meningitis   . Normal coronary arteries 2014  . Palpitations   . Premature atrial contractions   . PVC's (premature ventricular contractions)     Past Surgical History:  Procedure Laterality Date  . ABDOMINAL HYSTERECTOMY    . IR FLUORO GUIDED NEEDLE PLC ASPIRATION/INJECTION LOC  06/19/2019  . LEFT HEART CATHETERIZATION WITH CORONARY ANGIOGRAM N/A 09/05/2012   Procedure: LEFT HEART CATHETERIZATION WITH CORONARY ANGIOGRAM;  Surgeon: Wellington Hampshire, MD;  Location: [facility];  Service: Cardiovascular;  Laterality: N/A;  . RESECTION DISTAL CLAVICAL Left 07/03/2020   Procedure:  RESECTION DISTAL CLAVICAL;  Surgeon: Carole Civil, MD;  Location: [facility];  Service: Orthopedics;  Laterality: Left;  . SHOULDER OPEN ROTATOR CUFF REPAIR Left 07/03/2020   Procedure: ROTATOR CUFF REPAIR SHOULDER OPEN WITH CHROMEOPLASTY;  Surgeon: Carole Civil, MD;  Location: [facility];  Service: Orthopedics;  Laterality: Left;  . THYROID SURGERY  2002    There were no vitals filed for this visit.   Subjective Assessment - 07/22/20 0904    Subjective  S: I put ice on it last night.    Currently in Pain? Yes    Pain Score 8     Pain Location Shoulder    Pain Orientation Left    Pain Descriptors / Indicators Aching;Sore;Discomfort    Pain Type Acute pain    Pain Radiating Towards down arm    Pain Onset 1 to 4 weeks ago    Pain Frequency Constant    Aggravating Factors  movement    Pain Relieving Factors medication, ice    Effect of Pain on Daily Activities unable to use LUE for ADLs    Multiple Pain Sites No              [facility] OT Assessment - 07/22/20 0903      Assessment   Medical Diagnosis Left RTC repair      Precautions   Precautions Shoulder;Other (comment)  Type of Shoulder Precautions Standard RCR protocol   Sling on 4-6 weeks   0-6 weeks (3/18-4/29)-: PROM, pendulum   6-12 weeks (4/29-6/10): AAROM progressing to AROM   12-24 weeks: Strengthening exercises    Shoulder Interventions Shoulder sling/immobilizer;At all times;Off for dressing/bathing/exercises    Precaution Comments History of Plasmocytoma.                    OT Treatments/Exercises (OP) - 07/22/20 0904      Exercises   Exercises Shoulder      Shoulder Exercises: Supine   External Rotation PROM;5 reps    Internal Rotation PROM;5 reps    Flexion PROM;5 reps    ABduction PROM;5 reps    Other Supine Exercises passive range of elbow flexion/extension, supination/pronation, 10X      Shoulder Exercises: ROM/Strengthening   Pendulum pt standing bent at waist, letting arm hang down,  1'. Completing pendulums side to side, forward/backward, circles 1' each      Manual Therapy   Manual Therapy Myofascial release    Manual therapy comments manual therapy completed seperately from all other interventions this date    Myofascial Release myofascial release and manual stretching to left upper arm, scapular, shoulder, and elbow region                    OT Short Term Goals - 07/17/20 1213      OT SHORT TERM GOAL #1   Title Patient will be educated and independent with HEP in order to facilitate her progress in therapy and begin to use her LUE as her non dominant extremity for 50% or more of daily tasks.    Time 6    Period Weeks    Status On-going    Target Date 08/26/20      OT SHORT TERM GOAL #2   Title patient will increase her LUE P/ROM to [facility] in order to increase ability to complete upper body dressing tasks.    Time 6    Period Weeks    Status On-going      OT SHORT TERM GOAL #3   Title Patient will increase her LUE strength to 3/5 in order to complete functional waist level self care tasks with less difficulty.    Time 6    Period Weeks    Status On-going      OT SHORT TERM GOAL #4   Title Patient will report a decrease in pain for her LUE of approximately 6/10 or less while completing dressing and bathing tasks.    Time 6    Period Weeks    Status On-going      OT SHORT TERM GOAL #5   Title Patient will decrease LUE fascial restrictions to moderate amount in order to increase her functional mobility needed to complete low level reaching tasks.    Time 6    Period Weeks    Status On-going             OT Long Term Goals - 07/17/20 1213      OT LONG TERM GOAL #1   Title Patient will increase her LUE A/ROM to [facility] in order to complete reaching tasks at or above her shoulder level.    Time 12    Period Weeks    Status On-going      OT LONG TERM GOAL #2   Title Patient will increase her LUE strength to 4/5 or better in order to return to  household lifting tasks while using her LUE.    Time 12    Period Weeks    Status On-going      OT LONG TERM GOAL #3   Title Patient will report a decrease in pain level in her LUE to 3/10 or less while completing daily tasks.    Time 12    Period Weeks    Status On-going      OT LONG TERM GOAL #4   Title Patient will decrease her LUE fascial restrictions to min amount or less in order to increase the functional mobility needed to complete reaching tasks.    Time 12    Period Weeks    Status On-going                 Plan - 07/22/20 0948    Clinical Impression Statement A: Continued with myofascial release to address fascial restrictions. Pt with hypersensitivity at incision area, OT beginning desensitization by gently running finger over inicision, no pressure to the area. Instructed pt on desensitization at home as well. Pt continues to have max guarding and resistance to passive stretching, OT walking through slow breathing techniques during stretches, with some improvement in ROM tolerance. Added pendulums with movement today, instructing pt in goal of pendulums and to allow arm to hang by her side to allow for lengthening of muscles and reduction of spasming. Pt performing well with improved relaxation after approximately 1'. Added to HEP.    Body Structure / Function / Physical Skills ADL;UE functional use;Fascial restriction;Pain;ROM;Decreased knowledge of precautions;Strength;Mobility    Plan P:  Follow up on pendulums for HEP. continue manual therapy as tolerated and trial various relaxation techniques during manual therapy and passive range of motion. Attempt table slides or ball roll    OT Home Exercise Plan eval: A/ROM elbow, wrist, forearm, seated A/ROM scapular exercises; 4/6: pendulums    Consulted and Agree with Plan of Care Patient           Patient will benefit from skilled therapeutic intervention in order to improve the following deficits and impairments:    Body Structure / Function / Physical Skills: ADL,UE functional use,Fascial restriction,Pain,ROM,Decreased knowledge of precautions,Strength,Mobility       Visit Diagnosis: Other symptoms and signs involving the musculoskeletal system  Acute pain of left shoulder  Stiffness of left shoulder, not elsewhere classified    Problem List Patient Active Problem List   Diagnosis Date Noted  . S/p left rotator cuff repair distal clavicle resection 07/03/20  07/07/2020  . Complete tear of left rotator cuff   . 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 Plasma cell disorder 06/10/2019  . Lesion of bone of lumbosacral spine 05/28/2019  . Left-sided weakness 03/13/2016  . Cerebrovascular accident (CVA) ([place])   . Palpitations   . COPD (chronic obstructive pulmonary disease) ([place]) 08/20/2014  . Dyspnea 12/19/2013  . Chronic diastolic CHF (congestive heart failure) ([place]) 07/05/2013  . Lower extremity edema 07/05/2013  . Hyperlipidemia   . Chest pain 07/04/2013  . Hypokalemia 09/04/2012  . Precordial pain 09/04/2012  . Viral meningitis 01/22/2011  . Vomiting 01/22/2011  . PNEUMONIA, LEFT LOWER LOBE 10/10/2006  . DISEASE, ACUTE BRONCHOSPASM 10/10/2006  . Essential hypertension 05/23/2006  . OSTEOARTHRITIS 05/23/2006   Guadelupe Sabin, OTR/L  [phone] 07/22/2020, 9:52 AM  [place] [address] [place], [place], [address] Phone: [phone]   Fax:  [phone]  Name: Hannah Cooper MRN: [id number] Date  of Birth: [birth year]

## 2020-07-22 NOTE — Patient Instructions (Signed)
TOWEL SLIDES COMPLETE FOR 1-3 MINUTES, 3-5 TIMES PER DAY     COMPLETE PENDULUM EXERCISES FOR 30 SECONDS TO A MINUTE EACH, 3-5 TIMES PER DAY. ROM: Pendulum (Side-to-Side)   Bend forward 90 at waist, using table for support. Rock body side to side to swing arm. Repeat for 1 minute. Do __3__ sessions per day.  http://orth.exer.us/792   Copyright  VHI. All rights reserved.  Pendulum Forward/Back   Bend forward 90 at waist, using table for support. Rock body forward and back to swing arm. Repeat for 1 minute. Do __3__ sessions per day.  Copyright  VHI. All rights reserved.  Pendulum Circular   Bend forward 90 at waist, leaning on table for support. Rock body in a circular pattern to move arm clockwise for 1 minute then counterclockwise for 1 minute. Do _3___ sessions per day.

## 2020-07-27 ENCOUNTER — Ambulatory Visit (HOSPITAL_COMMUNITY): Payer: PRIVATE HEALTH INSURANCE

## 2020-07-29 ENCOUNTER — Encounter (HOSPITAL_COMMUNITY): Payer: Self-pay

## 2020-07-29 ENCOUNTER — Other Ambulatory Visit: Payer: Self-pay

## 2020-07-29 ENCOUNTER — Ambulatory Visit (HOSPITAL_COMMUNITY): Payer: PRIVATE HEALTH INSURANCE

## 2020-07-29 ENCOUNTER — Telehealth: Payer: Self-pay | Admitting: *Deleted

## 2020-07-29 DIAGNOSIS — M25612 Stiffness of left shoulder, not elsewhere classified: Secondary | ICD-10-CM

## 2020-07-29 DIAGNOSIS — M25512 Pain in left shoulder: Secondary | ICD-10-CM

## 2020-07-29 DIAGNOSIS — R29898 Other symptoms and signs involving the musculoskeletal system: Secondary | ICD-10-CM

## 2020-07-29 NOTE — Telephone Encounter (Signed)
CALLED PATIENT TO INFORM OF FU APPT. BEING MOVED TO 10 AM ON 07-30-20, SPOKE WITH PATIENT AND SHE AGREED TO THIS

## 2020-07-29 NOTE — Therapy (Signed)
[place], [place], [address] Phone: [phone]   Fax:  [phone]  Occupational Therapy Treatment  Patient Details  Name: Hannah Cooper MRN: [id number] Date of Birth: 09-03-1955 Referring Provider (OT): Arther Abbott, MD   Encounter Date: 07/29/2020   OT End of Session - 07/29/20 1652    Visit Number 5    Number of Visits 24    Date for OT Re-Evaluation 10/07/20   mini reassess on 09/11/20   Authorization Type 1) [employer] 2) [employer]    Authorization Time Period no copay. no visit limit Fax initial evaluation for medical necessity.    Progress Note Due on Visit 10    OT Start Time 1611   Pt arrived late   OT Stop Time 1638    OT Time Calculation (min) 27 min    Activity Tolerance Patient limited by pain    Behavior During Therapy [facility] for tasks assessed/performed           Past Medical History:  Diagnosis Date  . Arthritis   . Atypical chest pain    chronic  . Cancer ([place])    bone cancer  . Chronic back pain   . Chronic diastolic CHF (congestive heart failure) ([place])   . COPD (chronic obstructive pulmonary disease) ([place])   . DDD (degenerative disc disease), cervical   . History of radiation therapy 07/16/19-08/22/19   L4 spine ; Dr. Gery Pray  . Hyperlipidemia   . Hypertension   . Lumbar radiculopathy   . Meningitis   . Normal coronary arteries 2014  . Palpitations   . Premature atrial contractions   . PVC's (premature ventricular contractions)     Past Surgical History:  Procedure Laterality Date  . ABDOMINAL HYSTERECTOMY    . IR FLUORO GUIDED NEEDLE PLC ASPIRATION/INJECTION LOC  06/19/2019  . LEFT HEART CATHETERIZATION WITH CORONARY ANGIOGRAM N/A 09/05/2012   Procedure: LEFT HEART CATHETERIZATION WITH CORONARY ANGIOGRAM;  Surgeon: Wellington Hampshire, MD;  Location: [facility];  Service: Cardiovascular;  Laterality: N/A;  . RESECTION DISTAL CLAVICAL Left 07/03/2020    Procedure: RESECTION DISTAL CLAVICAL;  Surgeon: Carole Civil, MD;  Location: [facility];  Service: Orthopedics;  Laterality: Left;  . SHOULDER OPEN ROTATOR CUFF REPAIR Left 07/03/2020   Procedure: ROTATOR CUFF REPAIR SHOULDER OPEN WITH CHROMEOPLASTY;  Surgeon: Carole Civil, MD;  Location: [facility];  Service: Orthopedics;  Laterality: Left;  . THYROID SURGERY  2002    There were no vitals filed for this visit.   Subjective Assessment - 07/29/20 1614    Currently in Pain? Yes    Pain Score 7     Pain Location Shoulder    Pain Orientation Left    Pain Descriptors / Indicators Aching;Sore    Pain Type Acute pain    Pain Onset 1 to 4 weeks ago    Pain Frequency Constant    Aggravating Factors  movement    Pain Relieving Factors medication, ice    Effect of Pain on Daily Activities unable to use LUE for ADL tasks    Multiple Pain Sites No              [facility] OT Assessment - 07/29/20 1617      Assessment   Medical Diagnosis Left RTC repair      Precautions   Precautions Shoulder;Other (comment)    Type of Shoulder Precautions Standard RCR protocol   Sling on 4-6 weeks  0-6 weeks (3/18-4/29)-: PROM, pendulum   6-12 weeks (4/29-6/10): AAROM progressing to AROM   12-24 weeks: Strengthening exercises    Shoulder Interventions Shoulder sling/immobilizer;At all times;Off for dressing/bathing/exercises    Precaution Comments History of Plasmocytoma.                    OT Treatments/Exercises (OP) - 07/29/20 1651      Exercises   Exercises Shoulder      Shoulder Exercises: Supine   External Rotation PROM;5 reps    Internal Rotation PROM;5 reps    Flexion PROM;10 reps    ABduction PROM;10 reps      Manual Therapy   Manual Therapy Myofascial release    Manual therapy comments manual therapy completed seperately from all other interventions this date    Myofascial Release myofascial release and manual stretching to left upper arm, scapular, shoulder, and elbow  region                    OT Short Term Goals - 07/17/20 1213      OT SHORT TERM GOAL #1   Title Patient will be educated and independent with HEP in order to facilitate her progress in therapy and begin to use her LUE as her non dominant extremity for 50% or more of daily tasks.    Time 6    Period Weeks    Status On-going    Target Date 08/26/20      OT SHORT TERM GOAL #2   Title patient will increase her LUE P/ROM to [facility] in order to increase ability to complete upper body dressing tasks.    Time 6    Period Weeks    Status On-going      OT SHORT TERM GOAL #3   Title Patient will increase her LUE strength to 3/5 in order to complete functional waist level self care tasks with less difficulty.    Time 6    Period Weeks    Status On-going      OT SHORT TERM GOAL #4   Title Patient will report a decrease in pain for her LUE of approximately 6/10 or less while completing dressing and bathing tasks.    Time 6    Period Weeks    Status On-going      OT SHORT TERM GOAL #5   Title Patient will decrease LUE fascial restrictions to moderate amount in order to increase her functional mobility needed to complete low level reaching tasks.    Time 6    Period Weeks    Status On-going             OT Long Term Goals - 07/17/20 1213      OT LONG TERM GOAL #1   Title Patient will increase her LUE A/ROM to [facility] in order to complete reaching tasks at or above her shoulder level.    Time 12    Period Weeks    Status On-going      OT LONG TERM GOAL #2   Title Patient will increase her LUE strength to 4/5 or better in order to return to household lifting tasks while using her LUE.    Time 12    Period Weeks    Status On-going      OT LONG TERM GOAL #3   Title Patient will report a decrease in pain level in her LUE to 3/10 or less while completing daily tasks.    Time 12  Period Weeks    Status On-going      OT LONG TERM GOAL #4   Title Patient will decrease her  LUE fascial restrictions to min amount or less in order to increase the functional mobility needed to complete reaching tasks.    Time 12    Period Weeks    Status On-going                 Plan - 07/29/20 1652    Clinical Impression Statement A: Pt reports not taking anything for pain management prior to session. Required 2 towels under elbow for positioning. Extremely limited with P/ROM tolerance due to pain with muscle guarding. Education provided for breathing technique to increase relaxation as well as positive thinking and mindfulness. Pt did demonstrate less guarding and more muscle relaxation briefly when utilizing techniques. Able to tolerate er to approximately 0 this date which is improved since evaluation.    Body Structure / Function / Physical Skills ADL;UE functional use;Fascial restriction;Pain;ROM;Decreased knowledge of precautions;Strength;Mobility    Plan P: Continue with myofascial release and relaxation techniques. Attempt either table slides or therapy ball stretches.    Consulted and Agree with Plan of Care Patient           Patient will benefit from skilled therapeutic intervention in order to improve the following deficits and impairments:   Body Structure / Function / Physical Skills: ADL,UE functional use,Fascial restriction,Pain,ROM,Decreased knowledge of precautions,Strength,Mobility       Visit Diagnosis: Acute pain of left shoulder  Stiffness of left shoulder, not elsewhere classified  Other symptoms and signs involving the musculoskeletal system    Problem List Patient Active Problem List   Diagnosis Date Noted  . S/p left rotator cuff repair distal clavicle resection 07/03/20  07/07/2020  . Complete tear of left rotator cuff   . 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[place]) 07/10/2019  . Plasma cell disorder 06/10/2019  . Lesion of bone of lumbosacral spine 05/28/2019  . Left-sided weakness 03/13/2016  . Cerebrovascular accident (CVA)  ([place])   . Palpitations   . COPD (chronic obstructive pulmonary disease) ([place]) 08/20/2014  . Dyspnea 12/19/2013  . Chronic diastolic CHF (congestive heart failure) ([place]) 07/05/2013  . Lower extremity edema 07/05/2013  . Hyperlipidemia   . Chest pain 07/04/2013  . Hypokalemia 09/04/2012  . Precordial pain 09/04/2012  . Viral meningitis 01/22/2011  . Vomiting 01/22/2011  . PNEUMONIA, LEFT LOWER LOBE 10/10/2006  . DISEASE, ACUTE BRONCHOSPASM 10/10/2006  . Essential hypertension 05/23/2006  . OSTEOARTHRITIS 05/23/2006    Ailene Ravel, OTR/L,CBIS  [phone]  07/29/2020, 4:56 PM  [place] [address] [place], [place], [address] Phone: [phone]   Fax:  [phone]  Name: Hannah Cooper MRN: [id number] Date of Birth: 11-05-1955

## 2020-07-30 ENCOUNTER — Ambulatory Visit
Admission: RE | Admit: 2020-07-30 | Discharge: 2020-07-30 | Disposition: A | Discharge status: Home / Self Care | Payer: No Typology Code available for payment source | Source: Ambulatory Visit | Attending: Radiation Oncology | Admitting: Radiation Oncology

## 2020-07-30 ENCOUNTER — Encounter: Payer: Self-pay | Admitting: Radiation Oncology

## 2020-07-30 VITALS — BP 129/75 | HR 60 | Temp 97.7°F | Resp 18 | Ht 65.0 in | Wt 222.2 lb

## 2020-07-30 DIAGNOSIS — M25512 Pain in left shoulder: Secondary | ICD-10-CM | POA: Insufficient documentation

## 2020-07-30 DIAGNOSIS — Z794 Long term (current) use of insulin: Secondary | ICD-10-CM | POA: Insufficient documentation

## 2020-07-30 DIAGNOSIS — Z08 Encounter for follow-up examination after completed treatment for malignant neoplasm: Secondary | ICD-10-CM | POA: Diagnosis not present

## 2020-07-30 DIAGNOSIS — C9030 Solitary plasmacytoma not having achieved remission: Secondary | ICD-10-CM | POA: Diagnosis not present

## 2020-07-30 DIAGNOSIS — C9020 Extramedullary plasmacytoma not having achieved remission: Secondary | ICD-10-CM | POA: Diagnosis not present

## 2020-07-30 DIAGNOSIS — Z79899 Other long term (current) drug therapy: Secondary | ICD-10-CM | POA: Diagnosis not present

## 2020-07-30 DIAGNOSIS — Z923 Personal history of irradiation: Secondary | ICD-10-CM | POA: Diagnosis not present

## 2020-07-30 DIAGNOSIS — M545 Low back pain, unspecified: Secondary | ICD-10-CM | POA: Insufficient documentation

## 2020-07-30 DIAGNOSIS — M899 Disorder of bone, unspecified: Secondary | ICD-10-CM

## 2020-07-30 NOTE — Progress Notes (Signed)
Patient in for follow up after receiving radiation to L4 vertebral body 07/16/19-08/22/19. Patient reports having back pain rating 7/10. Patient reports increase fatigue. Reports having a good appetite. Patient denies weakness to lower extremities. Patient reports having constipation. Denies urinary issues. Denies any skin issues.       Vitals:   07/30/20 0955  BP: 129/75  Pulse: 60  Resp: 18  Temp: 97.7 F (36.5 C)  SpO2: 100%  Weight: 222 lb 3.2 oz (100.8 kg)  Height: 5\' 5"  (1.651 m)

## 2020-07-30 NOTE — Progress Notes (Signed)
Radiation Oncology         ([phone] ________________________________  Name: Hannah Cooper MRN: [id number]  Date: 07/30/2020  DOB: 1955/09/17  Follow-Up Visit Note  CC: Jake Samples, PA-C  Jake Samples, [place]*    ICD-10-CM   1. 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 Lesion of bone of lumbosacral spine  M89.9     Diagnosis: Plasmocytoma of L4 vertebral body  Interval Since Last Radiation: Eleven months, one week, and one day  Radiation Treatment Dates: 07/16/2019 through 08/22/2019 Site Technique Total Dose (Gy) Dose per Fx (Gy) Completed Fx Beam Energies  Lumbar Spine: Spine 3D 50.4/50.4 1.8 28/28 15X    Narrative:  The patient returns today for routine follow-up. Since her last visit, she underwent an open rotator cuff repair of the left shoulder with acromioplasty and resection of the distal clavicle on 07/03/2020 under the care of Dr. Aline Brochure.   On review of systems, she reports pain in her left shoulder area.  She is undergoing physical therapy at this time.  She does wear a sling for this issue.. She denies numbness or weakness in her lower extremities.  She continues have pain in the lower back region.  This pain is controlled well with 1 or 2 hydrocodone per day..  She did have a video follow-up with Dr. Delton Coombes in January.  At that time was recommended she follow-up in 3 months with with blood work, myeloma panel and MRI of her lumbar spine area to follow-up on her plasmacytoma.  These appointments have not been scheduled as of yet.  I recommended she call Dr. Tomie China office and get these scheduled as soon as possible.  ALLERGIES:  is allergic to other.  Meds: Current Outpatient Medications  Medication Sig Dispense Refill  . acetaminophen (TYLENOL) 500 MG tablet Take 1,000 mg by mouth every 6 (six) hours as needed for headache (pain).     Marland Kitchen albuterol (PROVENTIL) (2.5 MG/3ML) 0.083% nebulizer  solution Take 2.5 mg by nebulization every 6 (six) hours as needed for wheezing or shortness of breath.     Marland Kitchen albuterol (VENTOLIN HFA) 108 (90 Base) MCG/ACT inhaler Inhale 1-2 puffs into the lungs every 4 (four) hours as needed for wheezing or shortness of breath. 18 g 0  . Apoaequorin (PREVAGEN) 10 MG CAPS Take 10 mg by mouth daily.    . chlorthalidone (HYGROTON) 50 MG tablet Take 1 tablet (50 mg total) by mouth daily. 90 tablet 3  . Cholecalciferol (DIALYVITE VITAMIN D 5000) 125 MCG (5000 UT) capsule Take 5,000 Units by mouth daily.    . cycloSPORINE (RESTASIS) 0.05 % ophthalmic emulsion Place 1 drop into both eyes 2 (two) times daily.    Marland Kitchen diltiazem (CARDIZEM) 30 MG tablet Take 1 tablet by mouth twice daily 180 tablet 3  . furosemide (LASIX) 20 MG tablet Take 1 tablet daily as needed may take 2 tablets prn (Patient taking differently: Take 20-40 mg by mouth daily as needed for edema.) 60 tablet 1  . glipiZIDE-metformin (METAGLIP) 5-500 MG tablet Take 1 tablet by mouth in the morning and at bedtime.    Marland Kitchen HYDROcodone-acetaminophen (NORCO) 10-325 MG tablet Take 1 tablet by mouth 2 (two) times daily as needed for moderate pain (pain.). 60 tablet 0  . ibuprofen (ADVIL) 800 MG tablet Take 1 tablet (800 mg total) by mouth every 8 (eight) hours as needed. 90 tablet 1  . Insulin Glargine (BASAGLAR KWIKPEN) 100 UNIT/ML Inject  15 Units into the skin at bedtime as needed (blood sugar over 150).    . isosorbide mononitrate (IMDUR) 30 MG 24 hr tablet TAKE 1 TABLET (30 MG TOTAL) BY MOUTH DAILY. (Patient taking differently: Take 30 mg by mouth daily.) 90 tablet 0  . lidocaine (LIDODERM) 5 % Place 2 patches onto the skin daily. Remove & Discard patch within 12 hours or as directed by MD 60 patch 1  . losartan (COZAAR) 100 MG tablet Take 100 mg by mouth daily.    . metFORMIN (GLUCOPHAGE) 500 MG tablet Take 500 mg by mouth 2 (two) times daily.    . metoprolol tartrate (LOPRESSOR) 100 MG tablet Take 100 mg by mouth 2  (two) times daily.    . Multiple Vitamin (MULTIVITAMIN WITH MINERALS) TABS tablet Take 1 tablet by mouth daily.    . mupirocin ointment (BACTROBAN) 2 % Apply 1 application topically 3 (three) times daily as needed (wound care).    . promethazine (PHENERGAN) 12.5 MG tablet Take 1 tablet (12.5 mg total) by mouth every 6 (six) hours as needed for nausea or vomiting. 60 tablet 1  . RELION PEN NEEDLES 31G X 6 MM MISC USE 1 PEN NEEDLE TO INJECT INSULIN AT BEDTIME    . tiZANidine (ZANAFLEX) 4 MG tablet Take 1 tablet (4 mg total) by mouth daily. 30 tablet 1  . TURMERIC PO Take 1,000 mg by mouth daily.     No current facility-administered medications for this encounter.    Physical Findings: The patient is in no acute distress. Patient is alert and oriented.  She did ambulate into the clinic today whereas last visit she was in a wheelchair.  This would suggest improvement in her low back pain  height is 5\' 5"  (1.651 m) and weight is 222 lb 3.2 oz (100.8 kg). Her temperature is 97.7 F (36.5 C). Her blood pressure is 129/75 and her pulse is 60. Her respiration is 18 and oxygen saturation is 100%.   Lungs are clear to auscultation bilaterally. Heart has regular rate and rhythm. No palpable cervical, supraclavicular, or axillary adenopathy. Abdomen soft, non-tender, normal bowel sounds.  Motor strength is 5 out of 5 in the proximal and distal muscle groups of the lower extremities.  Lab Findings: Lab Results  Component Value Date   WBC 3.2 (L) 07/02/2020   HGB 11.6 (L) 07/02/2020   HCT 34.8 (L) 07/02/2020   MCV 93.3 07/02/2020   PLT 284 07/02/2020    Radiographic Findings: No results found.  Impression: Plasmocytoma of L4 vertebral body  Clinically stable at this time.  Scans and myeloma panel pending at this time  Plan: The patient will follow up with radiation oncology in 6 months. She still has not followed up with Dr. Delton Coombes and I recommend she schedule these appointments as soon as  possible.  Total time spent in this encounter was 15 minutes which included reviewing the patient's most recent surgery, physical examination, and documentation.  ____________________________________  Blair Promise, PhD, MD  This document serves as a record of services personally performed by Gery Pray, MD. It was created on his behalf by Clerance Lav, a trained medical scribe. The creation of this record is based on the scribe's personal observations and the provider's statements to them. This document has been checked and approved by the attending provider.

## 2020-07-31 ENCOUNTER — Encounter (HOSPITAL_COMMUNITY): Payer: Self-pay | Admitting: Occupational Therapy

## 2020-07-31 ENCOUNTER — Ambulatory Visit (HOSPITAL_COMMUNITY): Payer: PRIVATE HEALTH INSURANCE | Admitting: Occupational Therapy

## 2020-07-31 ENCOUNTER — Other Ambulatory Visit: Payer: Self-pay

## 2020-07-31 DIAGNOSIS — M25512 Pain in left shoulder: Secondary | ICD-10-CM

## 2020-07-31 DIAGNOSIS — M25612 Stiffness of left shoulder, not elsewhere classified: Secondary | ICD-10-CM

## 2020-07-31 DIAGNOSIS — R29898 Other symptoms and signs involving the musculoskeletal system: Secondary | ICD-10-CM | POA: Diagnosis not present

## 2020-07-31 NOTE — Patient Instructions (Signed)
1) SHOULDER: Flexion On Table   Place hands on towel placed on table, elbows straight. Lean forward with you upper body, pushing towel away from body.  _10-15__ reps per set, _3-5__ sets per day  2) Abduction (Passive)   With arm out to side, resting on towel placed on table with palm DOWN, keeping trunk away from table, lean to the side while pushing towel away from body.  Repeat __10-15__ times. Do __3-5__ sessions per day.  Copyright  VHI. All rights reserved.     3) Internal Rotation (Assistive)   Seated with elbow bent at right angle and held against side, slide arm on table surface in an inward arc keeping elbow anchored in place. Repeat _10-15___ times. Do _3-5___ sessions per day. Activity: Use this motion to brush crumbs off the table.  Copyright  VHI. All rights reserved.   

## 2020-07-31 NOTE — Therapy (Signed)
[place], [place], [address] Phone: [phone]   Fax:  [phone]  Occupational Therapy Treatment  Patient Details  Name: Hannah Cooper MRN: [id number] Date of Birth: 1955-08-02 Referring Provider (OT): Arther Abbott, MD   Encounter Date: 07/31/2020   OT End of Session - 07/31/20 1497    Visit Number 6    Number of Visits 24    Date for OT Re-Evaluation 10/07/20   mini reassess on 09/11/20   Authorization Type 1) [employer] 2) [employer]    Authorization Time Period no copay. no visit limit Fax initial evaluation for medical necessity.    Progress Note Due on Visit 10    OT Start Time [phone]    OT Stop Time 1032    OT Time Calculation (min) 38 min    Activity Tolerance Patient limited by pain    Behavior During Therapy [facility] for tasks assessed/performed           Past Medical History:  Diagnosis Date  . Arthritis   . Atypical chest pain    chronic  . Cancer ([place])    bone cancer  . Chronic back pain   . Chronic diastolic CHF (congestive heart failure) ([place])   . COPD (chronic obstructive pulmonary disease) ([place])   . DDD (degenerative disc disease), cervical   . History of radiation therapy 07/16/19-08/22/19   L4 spine ; Dr. Gery Pray  . Hyperlipidemia   . Hypertension   . Lumbar radiculopathy   . Meningitis   . Normal coronary arteries 2014  . Palpitations   . Premature atrial contractions   . PVC's (premature ventricular contractions)     Past Surgical History:  Procedure Laterality Date  . ABDOMINAL HYSTERECTOMY    . IR FLUORO GUIDED NEEDLE PLC ASPIRATION/INJECTION LOC  06/19/2019  . LEFT HEART CATHETERIZATION WITH CORONARY ANGIOGRAM N/A 09/05/2012   Procedure: LEFT HEART CATHETERIZATION WITH CORONARY ANGIOGRAM;  Surgeon: Wellington Hampshire, MD;  Location: [facility] CATH LAB;  Service: Cardiovascular;  Laterality: N/A;  . RESECTION DISTAL CLAVICAL Left 07/03/2020   Procedure:  RESECTION DISTAL CLAVICAL;  Surgeon: Carole Civil, MD;  Location: [facility];  Service: Orthopedics;  Laterality: Left;  . SHOULDER OPEN ROTATOR CUFF REPAIR Left 07/03/2020   Procedure: ROTATOR CUFF REPAIR SHOULDER OPEN WITH CHROMEOPLASTY;  Surgeon: Carole Civil, MD;  Location: [facility] OR[facility];  Service: Orthopedics;  Laterality: Left;  . THYROID SURGERY  2002    There were no vitals filed for this visit.   Subjective Assessment - 07/31/20 0955    Subjective  S: I'm scared to do exercises sometimes.    Currently in Pain? Yes    Pain Score 7     Pain Location Shoulder    Pain Orientation Left    Pain Descriptors / Indicators Aching;Sore    Pain Type Acute pain    Pain Radiating Towards down arm    Pain Onset 1 to 4 weeks ago    Pain Frequency Constant    Aggravating Factors  movement    Pain Relieving Factors medication, ice    Effect of Pain on Daily Activities unable to use LUE for ADLs    Multiple Pain Sites No              [facility] OT Assessment - 07/31/20 0955      Assessment   Medical Diagnosis Left RTC repair      Precautions   Precautions Shoulder;Other (comment)  Type of Shoulder Precautions Standard RCR protocol   Sling on 4-6 weeks   0-6 weeks (3/18-4/29)-: PROM, pendulum   6-12 weeks (4/29-6/10): AAROM progressing to AROM   12-24 weeks: Strengthening exercises    Shoulder Interventions Shoulder sling/immobilizer;At all times;Off for dressing/bathing/exercises    Precaution Comments History of Plasmocytoma.                    OT Treatments/Exercises (OP) - 07/31/20 0957      Exercises   Exercises Shoulder      Shoulder Exercises: Supine   External Rotation PROM;5 reps    Internal Rotation PROM;5 reps    Flexion PROM;5 reps    ABduction PROM;5 reps      Shoulder Exercises: ROM/Strengthening   Pendulum pt standing bent at waist, letting arm hang down, 1'. Completing pendulums side to side, forward/backward, circles 1' each    Other  ROM/Strengthening Exercises Table slides: flexion, abduction, IR/er 5x each      Manual Therapy   Manual Therapy Myofascial release    Manual therapy comments manual therapy completed seperately from all other interventions this date    Myofascial Release myofascial release and manual stretching to left upper arm, scapular, shoulder, and elbow region                    OT Short Term Goals - 07/17/20 1213      OT SHORT TERM GOAL #1   Title Patient will be educated and independent with HEP in order to facilitate her progress in therapy and begin to use her LUE as her non dominant extremity for 50% or more of daily tasks.    Time 6    Period Weeks    Status On-going    Target Date 08/26/20      OT SHORT TERM GOAL #2   Title patient will increase her LUE P/ROM to [facility] in order to increase ability to complete upper body dressing tasks.    Time 6    Period Weeks    Status On-going      OT SHORT TERM GOAL #3   Title Patient will increase her LUE strength to 3/5 in order to complete functional waist level self care tasks with less difficulty.    Time 6    Period Weeks    Status On-going      OT SHORT TERM GOAL #4   Title Patient will report a decrease in pain for her LUE of approximately 6/10 or less while completing dressing and bathing tasks.    Time 6    Period Weeks    Status On-going      OT SHORT TERM GOAL #5   Title Patient will decrease LUE fascial restrictions to moderate amount in order to increase her functional mobility needed to complete low level reaching tasks.    Time 6    Period Weeks    Status On-going             OT Long Term Goals - 07/17/20 1213      OT LONG TERM GOAL #1   Title Patient will increase her LUE A/ROM to [facility] in order to complete reaching tasks at or above her shoulder level.    Time 12    Period Weeks    Status On-going      OT LONG TERM GOAL #2   Title Patient will increase her LUE strength to 4/5 or better in order to return  to  household lifting tasks while using her LUE.    Time 12    Period Weeks    Status On-going      OT LONG TERM GOAL #3   Title Patient will report a decrease in pain level in her LUE to 3/10 or less while completing daily tasks.    Time 12    Period Weeks    Status On-going      OT LONG TERM GOAL #4   Title Patient will decrease her LUE fascial restrictions to min amount or less in order to increase the functional mobility needed to complete reaching tasks.    Time 12    Period Weeks    Status On-going                 Plan - 07/31/20 1045    Clinical Impression Statement A: Pt arrived late for appt. Session focusing on improving tolerance to ROM. Continued with myofascial release and passive stretching, pt tolerating approximately 70 degrees flexion and abduction with constant cuing for breathing and relaxation. Initiated table slides this session, pt requiring increased time for completion and cuing to allow arm to rest on the table versus pushing down onto the towel. Verbal cuing for form and technique, HEP updated for table slides. Educated pt on pain expectations and importance of increasing ROM and perform HEP to prevent additional complications.    Body Structure / Function / Physical Skills ADL;UE functional use;Fascial restriction;Pain;ROM;Decreased knowledge of precautions;Strength;Mobility    Plan P: Continue with myofascial release and relaxation techniques. follow up on tables slides at home, complete ball stretches    OT Home Exercise Plan eval: A/ROM elbow, wrist, forearm, seated A/ROM scapular exercises; 4/6: pendulums; 4/15: table slides    Consulted and Agree with Plan of Care Patient           Patient will benefit from skilled therapeutic intervention in order to improve the following deficits and impairments:   Body Structure / Function / Physical Skills: ADL,UE functional use,Fascial restriction,Pain,ROM,Decreased knowledge of precautions,Strength,Mobility        Visit Diagnosis: Acute pain of left shoulder  Stiffness of left shoulder, not elsewhere classified  Other symptoms and signs involving the musculoskeletal system    Problem List Patient Active Problem List   Diagnosis Date Noted  . S/p left rotator cuff repair distal clavicle resection 07/03/20  07/07/2020  . Complete tear of left rotator cuff   . 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 Plasma cell disorder 06/10/2019  . Lesion of bone of lumbosacral spine 05/28/2019  . Left-sided weakness 03/13/2016  . Cerebrovascular accident (CVA) ([place])   . Palpitations   . COPD (chronic obstructive pulmonary disease) ([place]) 08/20/2014  . Dyspnea 12/19/2013  . Chronic diastolic CHF (congestive heart failure) ([place]) 07/05/2013  . Lower extremity edema 07/05/2013  . Hyperlipidemia   . Chest pain 07/04/2013  . Hypokalemia 09/04/2012  . Precordial pain 09/04/2012  . Viral meningitis 01/22/2011  . Vomiting 01/22/2011  . PNEUMONIA, LEFT LOWER LOBE 10/10/2006  . DISEASE, ACUTE BRONCHOSPASM 10/10/2006  . Essential hypertension 05/23/2006  . OSTEOARTHRITIS 05/23/2006   Hannah Cooper, Hannah Cooper  [phone] 07/31/2020, 10:48 AM  [facility] [address] [place], [place], [address] Phone: [phone]   Fax:  [phone]  Name: KAYLEEANN HUXFORD MRN: [id number] Date of Birth: 1956-04-13

## 2020-08-05 ENCOUNTER — Other Ambulatory Visit: Payer: Self-pay

## 2020-08-05 ENCOUNTER — Ambulatory Visit (HOSPITAL_COMMUNITY): Payer: PRIVATE HEALTH INSURANCE | Admitting: Occupational Therapy

## 2020-08-05 ENCOUNTER — Encounter (HOSPITAL_COMMUNITY): Payer: Self-pay | Admitting: Occupational Therapy

## 2020-08-05 DIAGNOSIS — R29898 Other symptoms and signs involving the musculoskeletal system: Secondary | ICD-10-CM | POA: Diagnosis not present

## 2020-08-05 DIAGNOSIS — M25512 Pain in left shoulder: Secondary | ICD-10-CM

## 2020-08-05 DIAGNOSIS — M25612 Stiffness of left shoulder, not elsewhere classified: Secondary | ICD-10-CM

## 2020-08-05 NOTE — Therapy (Signed)
[place], [place], [address] Phone: [phone]   Fax:  [phone]  Occupational Therapy Treatment  Patient Details  Name: Hannah Cooper MRN: [id number] Date of Birth: 04-30-1955 Referring Provider (OT): Arther Abbott, MD   Encounter Date: 08/05/2020   OT End of Session - 08/05/20 6767    Visit Number 7    Number of Visits 24    Date for OT Re-Evaluation 10/07/20   mini reassess on 09/11/20   Authorization Type 1) [employer] 2) [employer]    Authorization Time Period no copay. no visit limit Fax initial evaluation for medical necessity.    Progress Note Due on Visit 10    OT Start Time 1650    OT Stop Time 1728    OT Time Calculation (min) 38 min    Activity Tolerance Patient limited by pain    Behavior During Therapy [facility] for tasks assessed/performed           Past Medical History:  Diagnosis Date  . Arthritis   . Atypical chest pain    chronic  . Cancer ([place])    bone cancer  . Chronic back pain   . Chronic diastolic CHF (congestive heart failure) ([place])   . COPD (chronic obstructive pulmonary disease) ([place])   . DDD (degenerative disc disease), cervical   . History of radiation therapy 07/16/19-08/22/19   L4 spine ; Dr. Gery Pray  . Hyperlipidemia   . Hypertension   . Lumbar radiculopathy   . Meningitis   . Normal coronary arteries 2014  . Palpitations   . Premature atrial contractions   . PVC's (premature ventricular contractions)     Past Surgical History:  Procedure Laterality Date  . ABDOMINAL HYSTERECTOMY    . IR FLUORO GUIDED NEEDLE PLC ASPIRATION/INJECTION LOC  06/19/2019  . LEFT HEART CATHETERIZATION WITH CORONARY ANGIOGRAM N/A 09/05/2012   Procedure: LEFT HEART CATHETERIZATION WITH CORONARY ANGIOGRAM;  Surgeon: Wellington Hampshire, MD;  Location: [facility] CATH LAB;  Service: Cardiovascular;  Laterality: N/A;  . RESECTION DISTAL CLAVICAL Left 07/03/2020   Procedure:  RESECTION DISTAL CLAVICAL;  Surgeon: Carole Civil, MD;  Location: [facility];  Service: Orthopedics;  Laterality: Left;  . SHOULDER OPEN ROTATOR CUFF REPAIR Left 07/03/2020   Procedure: ROTATOR CUFF REPAIR SHOULDER OPEN WITH CHROMEOPLASTY;  Surgeon: Carole Civil, MD;  Location: [facility];  Service: Orthopedics;  Laterality: Left;  . THYROID SURGERY  2002    There were no vitals filed for this visit.   Subjective Assessment - 08/05/20 1652    Subjective  S: It's getting better overall.    Currently in Pain? Yes    Pain Score 6     Pain Location Shoulder    Pain Orientation Left    Pain Descriptors / Indicators Aching;Sore    Pain Type Acute pain    Pain Radiating Towards down arm    Pain Onset 1 to 4 weeks ago    Pain Frequency Constant    Aggravating Factors  movement    Pain Relieving Factors medication, ice    Effect of Pain on Daily Activities unable to use LUE for ADLs    Multiple Pain Sites No              [facility] OT Assessment - 08/05/20 1652      Assessment   Medical Diagnosis Left RTC repair      Precautions   Precautions Shoulder;Other (comment)  Type of Shoulder Precautions Standard RCR protocol   Sling on 4-6 weeks   0-6 weeks (3/18-4/29)-: PROM, pendulum   6-12 weeks (4/29-6/10): AAROM progressing to AROM   12-24 weeks: Strengthening exercises    Shoulder Interventions Shoulder sling/immobilizer;At all times;Off for dressing/bathing/exercises    Precaution Comments History of Plasmocytoma.                    OT Treatments/Exercises (OP) - 08/05/20 1653      Exercises   Exercises Shoulder      Shoulder Exercises: Supine   External Rotation PROM;5 reps    Internal Rotation PROM;5 reps    Flexion PROM;5 reps    ABduction PROM;5 reps      Shoulder Exercises: Seated   Extension AROM;10 reps    Row AROM;10 reps      Shoulder Exercises: Therapy Ball   Flexion 10 reps    ABduction 10 reps      Manual Therapy   Manual Therapy Myofascial  release    Manual therapy comments manual therapy completed seperately from all other interventions this date    Myofascial Release myofascial release and manual stretching to left upper arm, scapular, shoulder, and elbow region                    OT Short Term Goals - 07/17/20 1213      OT SHORT TERM GOAL #1   Title Patient will be educated and independent with HEP in order to facilitate her progress in therapy and begin to use her LUE as her non dominant extremity for 50% or more of daily tasks.    Time 6    Period Weeks    Status On-going    Target Date 08/26/20      OT SHORT TERM GOAL #2   Title patient will increase her LUE P/ROM to [facility] in order to increase ability to complete upper body dressing tasks.    Time 6    Period Weeks    Status On-going      OT SHORT TERM GOAL #3   Title Patient will increase her LUE strength to 3/5 in order to complete functional waist level self care tasks with less difficulty.    Time 6    Period Weeks    Status On-going      OT SHORT TERM GOAL #4   Title Patient will report a decrease in pain for her LUE of approximately 6/10 or less while completing dressing and bathing tasks.    Time 6    Period Weeks    Status On-going      OT SHORT TERM GOAL #5   Title Patient will decrease LUE fascial restrictions to moderate amount in order to increase her functional mobility needed to complete low level reaching tasks.    Time 6    Period Weeks    Status On-going             OT Long Term Goals - 07/17/20 1213      OT LONG TERM GOAL #1   Title Patient will increase her LUE A/ROM to [facility] in order to complete reaching tasks at or above her shoulder level.    Time 12    Period Weeks    Status On-going      OT LONG TERM GOAL #2   Title Patient will increase her LUE strength to 4/5 or better in order to return to household lifting tasks while using  her LUE.    Time 12    Period Weeks    Status On-going      OT LONG TERM GOAL #3    Title Patient will report a decrease in pain level in her LUE to 3/10 or less while completing daily tasks.    Time 12    Period Weeks    Status On-going      OT LONG TERM GOAL #4   Title Patient will decrease her LUE fascial restrictions to min amount or less in order to increase the functional mobility needed to complete reaching tasks.    Time 12    Period Weeks    Status On-going                 Plan - 08/05/20 1717    Clinical Impression Statement A: Pt reports she has been completing HEP and feels like she is able to stretch her shoulder some. Continued with myofascial release to address fascial restrictions, pt able to tolerate increased pressure during manual techniques today, much less sensitivity around incision areas. During passive stretching pt achieving 90 degrees flexion and abduction, approximately 30 degrees er. Resumed scapular A/ROM, added therapy ball stretches. Verbal cuing for form and technique.    Body Structure / Function / Physical Skills ADL;UE functional use;Fascial restriction;Pain;ROM;Decreased knowledge of precautions;Strength;Mobility    Plan P: Continue working to increase ROM and ROM tolerance, add supine isometrics    OT Home Exercise Plan eval: A/ROM elbow, wrist, forearm, seated A/ROM scapular exercises; 4/6: pendulums; 4/15: table slides    Consulted and Agree with Plan of Care Patient           Patient will benefit from skilled therapeutic intervention in order to improve the following deficits and impairments:   Body Structure / Function / Physical Skills: ADL,UE functional use,Fascial restriction,Pain,ROM,Decreased knowledge of precautions,Strength,Mobility       Visit Diagnosis: Acute pain of left shoulder  Stiffness of left shoulder, not elsewhere classified  Other symptoms and signs involving the musculoskeletal system    Problem List Patient Active Problem List   Diagnosis Date Noted  . S/p left rotator cuff repair  distal clavicle resection 07/03/20  07/07/2020  . Complete tear of left rotator cuff   . 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[place]) 07/10/2019  . Plasma cell disorder 06/10/2019  . Lesion of bone of lumbosacral spine 05/28/2019  . Left-sided weakness 03/13/2016  . Cerebrovascular accident (CVA) ([place])   . Palpitations   . COPD (chronic obstructive pulmonary disease) ([place]) 08/20/2014  . Dyspnea 12/19/2013  . Chronic diastolic CHF (congestive heart failure) ([place]) 07/05/2013  . Lower extremity edema 07/05/2013  . Hyperlipidemia   . Chest pain 07/04/2013  . Hypokalemia 09/04/2012  . Precordial pain 09/04/2012  . Viral meningitis 01/22/2011  . Vomiting 01/22/2011  . PNEUMONIA, LEFT LOWER LOBE 10/10/2006  . DISEASE, ACUTE BRONCHOSPASM 10/10/2006  . Essential hypertension 05/23/2006  . OSTEOARTHRITIS 05/23/2006   Guadelupe Sabin, OTR/L  [phone] 08/05/2020, 5:32 PM  [facility] [address] [place], [place], [address] Phone: [phone]   Fax:  [phone]  Name: TONAE LIVOLSI MRN: [id number] Date of Birth: 1955/08/11

## 2020-08-07 ENCOUNTER — Ambulatory Visit (HOSPITAL_COMMUNITY): Payer: PRIVATE HEALTH INSURANCE | Admitting: Occupational Therapy

## 2020-08-07 ENCOUNTER — Encounter (HOSPITAL_COMMUNITY): Payer: Self-pay | Admitting: Occupational Therapy

## 2020-08-07 ENCOUNTER — Other Ambulatory Visit: Payer: Self-pay

## 2020-08-07 DIAGNOSIS — M25612 Stiffness of left shoulder, not elsewhere classified: Secondary | ICD-10-CM

## 2020-08-07 DIAGNOSIS — R29898 Other symptoms and signs involving the musculoskeletal system: Secondary | ICD-10-CM

## 2020-08-07 DIAGNOSIS — M25512 Pain in left shoulder: Secondary | ICD-10-CM

## 2020-08-07 NOTE — Therapy (Signed)
[place], [place], [address] Phone: [phone]   Fax:  [phone]  Occupational Therapy Treatment  Patient Details  Name: Hannah Cooper MRN: [id number] Date of Birth: 1956/03/01 Referring Provider (OT): Arther Abbott, MD   Encounter Date: 08/07/2020   OT End of Session - 08/07/20 1021    Visit Number 8    Number of Visits 24    Date for OT Re-Evaluation 10/07/20   mini reassess on 09/11/20   Authorization Type 1) [employer] 2) [employer]    Authorization Time Period no copay. no visit limit Fax initial evaluation for medical necessity.    Progress Note Due on Visit 10    OT Start Time 0945    OT Stop Time 1025    OT Time Calculation (min) 40 min    Activity Tolerance Patient limited by pain    Behavior During Therapy [facility] for tasks assessed/performed           Past Medical History:  Diagnosis Date  . Arthritis   . Atypical chest pain    chronic  . Cancer ([place])    bone cancer  . Chronic back pain   . Chronic diastolic CHF (congestive heart failure) ([place])   . COPD (chronic obstructive pulmonary disease) ([place])   . DDD (degenerative disc disease), cervical   . History of radiation therapy 07/16/19-08/22/19   L4 spine ; Dr. Gery Pray  . Hyperlipidemia   . Hypertension   . Lumbar radiculopathy   . Meningitis   . Normal coronary arteries 2014  . Palpitations   . Premature atrial contractions   . PVC's (premature ventricular contractions)     Past Surgical History:  Procedure Laterality Date  . ABDOMINAL HYSTERECTOMY    . IR FLUORO GUIDED NEEDLE PLC ASPIRATION/INJECTION LOC  06/19/2019  . LEFT HEART CATHETERIZATION WITH CORONARY ANGIOGRAM N/A 09/05/2012   Procedure: LEFT HEART CATHETERIZATION WITH CORONARY ANGIOGRAM;  Surgeon: Wellington Hampshire, MD;  Location: [place] CATH LAB;  Service: Cardiovascular;  Laterality: N/A;  . RESECTION DISTAL CLAVICAL Left 07/03/2020   Procedure:  RESECTION DISTAL CLAVICAL;  Surgeon: Carole Civil, MD;  Location: [facility];  Service: Orthopedics;  Laterality: Left;  . SHOULDER OPEN ROTATOR CUFF REPAIR Left 07/03/2020   Procedure: ROTATOR CUFF REPAIR SHOULDER OPEN WITH CHROMEOPLASTY;  Surgeon: Carole Civil, MD;  Location: [facility] OR[facility];  Service: Orthopedics;  Laterality: Left;  . THYROID SURGERY  2002    There were no vitals filed for this visit.   Subjective Assessment - 08/07/20 0944    Subjective  S: It's sore today, I don't know if I overdid it.    Currently in Pain? Yes    Pain Score 7     Pain Location Shoulder    Pain Orientation Left    Pain Descriptors / Indicators Aching;Sore    Pain Type Acute pain    Pain Radiating Towards down arm    Pain Onset 1 to 4 weeks ago    Pain Frequency Constant    Aggravating Factors  unsure    Pain Relieving Factors ice, medication    Effect of Pain on Daily Activities unable to use LUE for ADLs    Multiple Pain Sites No              [facility] OT Assessment - 08/07/20 0944      Assessment   Medical Diagnosis Left RTC repair      Precautions  Precautions Shoulder;Other (comment)    Type of Shoulder Precautions Standard RCR protocol   Sling on 4-6 weeks   0-6 weeks (3/18-4/29)-: PROM, pendulum   6-12 weeks (4/29-6/10): AAROM progressing to AROM   12-24 weeks: Strengthening exercises    Shoulder Interventions Shoulder sling/immobilizer;At all times;Off for dressing/bathing/exercises    Precaution Comments History of Plasmocytoma.                    OT Treatments/Exercises (OP) - 08/07/20 0946      Exercises   Exercises Shoulder      Shoulder Exercises: Supine   Protraction PROM;5 reps    Horizontal ABduction PROM;5 reps    External Rotation PROM;5 reps    Internal Rotation PROM;5 reps    Flexion PROM;5 reps    ABduction PROM;5 reps      Shoulder Exercises: Seated   Extension AROM;10 reps    Row AROM;10 reps      Shoulder Exercises: Therapy Ball   Flexion  10 reps    ABduction 10 reps      Shoulder Exercises: Isometric Strengthening   Flexion Supine;3X5"    Extension Supine;3X5"    External Rotation Supine;3X5"    Internal Rotation Supine;3X5"    ABduction Supine;3X5"    ADduction Supine;3X5"      Manual Therapy   Manual Therapy Myofascial release    Manual therapy comments manual therapy completed seperately from all other interventions this date    Myofascial Release myofascial release and manual stretching to left upper arm, trapezius, and scapular regions to decrease pain and fascial restrictions and increase joint ROM                    OT Short Term Goals - 07/17/20 1213      OT SHORT TERM GOAL #1   Title Patient will be educated and independent with HEP in order to facilitate her progress in therapy and begin to use her LUE as her non dominant extremity for 50% or more of daily tasks.    Time 6    Period Weeks    Status On-going    Target Date 08/26/20      OT SHORT TERM GOAL #2   Title patient will increase her LUE P/ROM to [facility] in order to increase ability to complete upper body dressing tasks.    Time 6    Period Weeks    Status On-going      OT SHORT TERM GOAL #3   Title Patient will increase her LUE strength to 3/5 in order to complete functional waist level self care tasks with less difficulty.    Time 6    Period Weeks    Status On-going      OT SHORT TERM GOAL #4   Title Patient will report a decrease in pain for her LUE of approximately 6/10 or less while completing dressing and bathing tasks.    Time 6    Period Weeks    Status On-going      OT SHORT TERM GOAL #5   Title Patient will decrease LUE fascial restrictions to moderate amount in order to increase her functional mobility needed to complete low level reaching tasks.    Time 6    Period Weeks    Status On-going             OT Long Term Goals - 07/17/20 1213      OT LONG TERM GOAL #1   Title  Patient will increase her LUE A/ROM  to [facility] in order to complete reaching tasks at or above her shoulder level.    Time 12    Period Weeks    Status On-going      OT LONG TERM GOAL #2   Title Patient will increase her LUE strength to 4/5 or better in order to return to household lifting tasks while using her LUE.    Time 12    Period Weeks    Status On-going      OT LONG TERM GOAL #3   Title Patient will report a decrease in pain level in her LUE to 3/10 or less while completing daily tasks.    Time 12    Period Weeks    Status On-going      OT LONG TERM GOAL #4   Title Patient will decrease her LUE fascial restrictions to min amount or less in order to increase the functional mobility needed to complete reaching tasks.    Time 12    Period Weeks    Status On-going                 Plan - 08/07/20 1017    Clinical Impression Statement A: Pt reports increased soreness today, woke up leaning on her arm last night. Continued with myofascial release to address fascial restrictions and tenderness. Continued with passive stretching, pt able to achieve ROM at 90 degrees flexion/abduction, approximately 40 degrees er. Added isometrics today, continued with scapular A/ROM, and therapy ball stretches. Verbal cuing for form and technique.    Body Structure / Function / Physical Skills ADL;UE functional use;Fascial restriction;Pain;ROM;Decreased knowledge of precautions;Strength;Mobility    Plan P: Measure for MD appt.  Continue working to increase ROM and ROM tolerance, continue with isometrics, increase ball stretches to 15 reps    OT Home Exercise Plan eval: A/ROM elbow, wrist, forearm, seated A/ROM scapular exercises; 4/6: pendulums; 4/15: table slides    Consulted and Agree with Plan of Care Patient           Patient will benefit from skilled therapeutic intervention in order to improve the following deficits and impairments:   Body Structure / Function / Physical Skills: ADL,UE functional use,Fascial  restriction,Pain,ROM,Decreased knowledge of precautions,Strength,Mobility       Visit Diagnosis: Acute pain of left shoulder  Stiffness of left shoulder, not elsewhere classified  Other symptoms and signs involving the musculoskeletal system    Problem List Patient Active Problem List   Diagnosis Date Noted  . S/p left rotator cuff repair distal clavicle resection 07/03/20  07/07/2020  . Complete tear of left rotator cuff   . 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[place]) 07/10/2019  . Plasma cell disorder 06/10/2019  . Lesion of bone of lumbosacral spine 05/28/2019  . Left-sided weakness 03/13/2016  . Cerebrovascular accident (CVA) ([place])   . Palpitations   . COPD (chronic obstructive pulmonary disease) ([place]) 08/20/2014  . Dyspnea 12/19/2013  . Chronic diastolic CHF (congestive heart failure) ([place]) 07/05/2013  . Lower extremity edema 07/05/2013  . Hyperlipidemia   . Chest pain 07/04/2013  . Hypokalemia 09/04/2012  . Precordial pain 09/04/2012  . Viral meningitis 01/22/2011  . Vomiting 01/22/2011  . PNEUMONIA, LEFT LOWER LOBE 10/10/2006  . DISEASE, ACUTE BRONCHOSPASM 10/10/2006  . Essential hypertension 05/23/2006  . OSTEOARTHRITIS 05/23/2006   Hannah Cooper, OTR/L  [phone] 08/07/2020, 10:26 AM  [place], [place], [address] Phone: [phone]  Fax:  [phone]  Name: Hannah Cooper MRN: [id number] Date of Birth: [birth year]

## 2020-08-12 ENCOUNTER — Encounter (HOSPITAL_COMMUNITY): Payer: Self-pay

## 2020-08-12 ENCOUNTER — Ambulatory Visit (HOSPITAL_COMMUNITY): Payer: PRIVATE HEALTH INSURANCE

## 2020-08-12 ENCOUNTER — Other Ambulatory Visit: Payer: Self-pay

## 2020-08-12 DIAGNOSIS — M25512 Pain in left shoulder: Secondary | ICD-10-CM

## 2020-08-12 DIAGNOSIS — R29898 Other symptoms and signs involving the musculoskeletal system: Secondary | ICD-10-CM | POA: Diagnosis not present

## 2020-08-12 DIAGNOSIS — M25612 Stiffness of left shoulder, not elsewhere classified: Secondary | ICD-10-CM

## 2020-08-12 NOTE — Patient Instructions (Signed)
AAROM Supine Shoulder External Rotation with Cane/Wand/Dowel  While you're lying down, use a cane, wand or dowel, to help assist your arm into external rotation. Keep the elbow tucked in by your side and only rotate your forearm out to the side with the help of the cane/wand/dowel. Maintain the elbow near your side. Complete 10X. 2-3 times a day

## 2020-08-12 NOTE — Therapy (Signed)
[place], [place], [address] Phone: [phone]   Fax:  [phone]  Occupational Therapy Treatment  Patient Details  Name: Hannah Cooper MRN: [id number] Date of Birth: Jul 30, 1955 Referring Provider (OT): Arther Abbott, MD  AROM  Overall AROM  Unable to assess;Due to precautions     PROM  Overall PROM Comments Assessed supine. IR/er adducted.   PROM Assessment Site Shoulder   Right/Left Shoulder Left   Left Shoulder Flexion 80 Degrees   previous: 40  Left Shoulder ABduction 80 Degrees   previous: 35  Left Shoulder Internal Rotation 90 Degrees   previous: same  Left Shoulder External Rotation 40 Degrees   preivous: -15    Strength  Overall Strength Unable to assess;Due to precautions    Encounter Date: 08/12/2020   OT End of Session - 08/12/20 1821    Visit Number 9    Number of Visits 24    Date for OT Re-Evaluation 10/07/20   mini reassess on 09/11/20   Authorization Type 1) [employer] 2) [employer]    Authorization Time Period no copay. no visit limit Fax initial evaluation for medical necessity.    Progress Note Due on Visit 10    OT Start Time 1600    OT Stop Time 1640    OT Time Calculation (min) 40 min    Activity Tolerance Patient limited by pain;Patient tolerated treatment well    Behavior During Therapy [facility] for tasks assessed/performed           Past Medical History:  Diagnosis Date  . Arthritis   . Atypical chest pain    chronic  . Cancer ([place])    bone cancer  . Chronic back pain   . Chronic diastolic CHF (congestive heart failure) ([place])   . COPD (chronic obstructive pulmonary disease) ([place])   . DDD (degenerative disc disease), cervical   . History of radiation therapy 07/16/19-08/22/19   L4 spine ; Dr. Gery Pray  . Hyperlipidemia   . Hypertension   . Lumbar radiculopathy   . Meningitis   . Normal coronary arteries 2014  . Palpitations   . Premature atrial  contractions   . PVC's (premature ventricular contractions)     Past Surgical History:  Procedure Laterality Date  . ABDOMINAL HYSTERECTOMY    . IR FLUORO GUIDED NEEDLE PLC ASPIRATION/INJECTION LOC  06/19/2019  . LEFT HEART CATHETERIZATION WITH CORONARY ANGIOGRAM N/A 09/05/2012   Procedure: LEFT HEART CATHETERIZATION WITH CORONARY ANGIOGRAM;  Surgeon: Wellington Hampshire, MD;  Location: [place] CATH LAB;  Service: Cardiovascular;  Laterality: N/A;  . RESECTION DISTAL CLAVICAL Left 07/03/2020   Procedure: RESECTION DISTAL CLAVICAL;  Surgeon: Carole Civil, MD;  Location: [facility];  Service: Orthopedics;  Laterality: Left;  . SHOULDER OPEN ROTATOR CUFF REPAIR Left 07/03/2020   Procedure: ROTATOR CUFF REPAIR SHOULDER OPEN WITH CHROMEOPLASTY;  Surgeon: Carole Civil, MD;  Location: [facility];  Service: Orthopedics;  Laterality: Left;  . THYROID SURGERY  2002    There were no vitals filed for this visit.   Subjective Assessment - 08/12/20 1605    Subjective  S: I drove myself.    Currently in Pain? Yes    Pain Score 6     Pain Location Shoulder    Pain Orientation Left    Pain Descriptors / Indicators Aching;Sore    Pain Type Surgical pain    Pain Onset 1 to 4 weeks ago    Pain  Frequency Constant    Aggravating Factors  nothing. Sore all the time.    Pain Relieving Factors pain medication, ice    Effect of Pain on Daily Activities unable to use LUE for ADL tasks.    Multiple Pain Sites No              [facility] OT Assessment - 08/12/20 1607      Assessment   Medical Diagnosis Left RTC repair      Precautions   Precautions Shoulder;Other (comment)    Type of Shoulder Precautions Standard RCR protocol   Sling on 4-6 weeks   0-6 weeks (3/18-4/29)-: PROM, pendulum   6-12 weeks (4/29-6/10): AAROM progressing to AROM   12-24 weeks: Strengthening exercises    Shoulder Interventions Shoulder sling/immobilizer;At all times;Off for dressing/bathing/exercises    Precaution Comments History of  Plasmocytoma.      ROM / Strength   AROM / PROM / Strength AROM;PROM;Strength      Palpation   Palpation comment max fascial restrictions in left upper arm, trapezius, and scapularis region.      AROM   Overall AROM  Unable to assess;Due to precautions      PROM   Overall PROM Comments Assessed supine. IR/er adducted.    PROM Assessment Site Shoulder    Right/Left Shoulder Left    Left Shoulder Flexion 80 Degrees   previous: 40   Left Shoulder ABduction 80 Degrees   previous: 35   Left Shoulder Internal Rotation 90 Degrees   previous: same   Left Shoulder External Rotation 40 Degrees   preivous: -15     Strength   Overall Strength Unable to assess;Due to precautions                    OT Treatments/Exercises (OP) - 08/12/20 1624      Exercises   Exercises Shoulder      Shoulder Exercises: Supine   Protraction PROM;5 reps;AAROM;10 reps;Limitations    Protraction Limitations 10% of range achieved    Horizontal ABduction PROM;5 reps    External Rotation PROM;5 reps;AAROM;10 reps    Internal Rotation PROM;5 reps;AAROM;10 reps    Flexion PROM;5 reps    ABduction PROM;5 reps      Shoulder Exercises: Seated   Row AROM;12 reps      Shoulder Exercises: Therapy Ball   Flexion --   12X with 2" hold at end stretch   ABduction --   12X with 2" hold at end stretch     Manual Therapy   Manual Therapy Myofascial release    Manual therapy comments manual therapy completed seperately from all other interventions this date    Myofascial Release myofascial release and manual stretching to left upper arm, trapezius, and scapular regions to decrease pain and fascial restrictions and increase joint ROM                  OT Education - 08/12/20 1819    Education provided Yes    Education Details supine AA/ROM external rotation added to HEP. Discussed desensitization technique for scar. Recommended using a cotton ball to apply very light pressure with circular motions  up and down incision.    Person(s) Educated Patient    Methods Explanation;Demonstration;Handout;Verbal cues;Tactile cues    Comprehension Verbalized understanding;Returned demonstration            OT Short Term Goals - 07/17/20 1213      OT SHORT TERM GOAL #1   Title Patient  will be educated and independent with HEP in order to facilitate her progress in therapy and begin to use her LUE as her non dominant extremity for 50% or more of daily tasks.    Time 6    Period Weeks    Status On-going    Target Date 08/26/20      OT SHORT TERM GOAL #2   Title patient will increase her LUE P/ROM to [facility] in order to increase ability to complete upper body dressing tasks.    Time 6    Period Weeks    Status On-going      OT SHORT TERM GOAL #3   Title Patient will increase her LUE strength to 3/5 in order to complete functional waist level self care tasks with less difficulty.    Time 6    Period Weeks    Status On-going      OT SHORT TERM GOAL #4   Title Patient will report a decrease in pain for her LUE of approximately 6/10 or less while completing dressing and bathing tasks.    Time 6    Period Weeks    Status On-going      OT SHORT TERM GOAL #5   Title Patient will decrease LUE fascial restrictions to moderate amount in order to increase her functional mobility needed to complete low level reaching tasks.    Time 6    Period Weeks    Status On-going             OT Long Term Goals - 07/17/20 1213      OT LONG TERM GOAL #1   Title Patient will increase her LUE A/ROM to [facility] in order to complete reaching tasks at or above her shoulder level.    Time 12    Period Weeks    Status On-going      OT LONG TERM GOAL #2   Title Patient will increase her LUE strength to 4/5 or better in order to return to household lifting tasks while using her LUE.    Time 12    Period Weeks    Status On-going      OT LONG TERM GOAL #3   Title Patient will report a decrease in pain level in  her LUE to 3/10 or less while completing daily tasks.    Time 12    Period Weeks    Status On-going      OT LONG TERM GOAL #4   Title Patient will decrease her LUE fascial restrictions to min amount or less in order to increase the functional mobility needed to complete reaching tasks.    Time 12    Period Weeks    Status On-going                 Plan - 08/12/20 1821    Clinical Impression Statement A: Measurements taken for MD appointment today. patient has made improvement with all shoulder ranges. She continues to have severe limitations with P/ROM tolerance due to pain and fear of pain and movement causing pain. When demonstrating therapy ball stretches, she is able to complete shoulder flexion and abduction further than she allows her therapist to take it. Complete supine er and protraction. Limited range was tolerated with protraction although good range was achieved with er and it was provided to complete at home for HEP. VC for form and technique were provided. Hypersensitivity to scar noted during session. Verbal instructions on decreasing sensitivity were provided.  Body Structure / Function / Physical Skills ADL;UE functional use;Fascial restriction;Pain;ROM;Decreased knowledge of precautions;Strength;Mobility    Plan P: Follow up on MD appointment. Provide handout on scar desensitization. Increase ball stretch to 15. Progress to AA/ROM for all ranges when able to tolerate.    OT Home Exercise Plan eval: A/ROM elbow, wrist, forearm, seated A/ROM scapular exercises; 4/6: pendulums; 4/15: table slides 4/27: AA/ROM er supine    Consulted and Agree with Plan of Care Patient           Patient will benefit from skilled therapeutic intervention in order to improve the following deficits and impairments:   Body Structure / Function / Physical Skills: ADL,UE functional use,Fascial restriction,Pain,ROM,Decreased knowledge of precautions,Strength,Mobility       Visit  Diagnosis: Acute pain of left shoulder  Stiffness of left shoulder, not elsewhere classified  Other symptoms and signs involving the musculoskeletal system    Problem List Patient Active Problem List   Diagnosis Date Noted  . S/p left rotator cuff repair distal clavicle resection 07/03/20  07/07/2020  . Complete tear of left rotator cuff   . 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 Plasma cell disorder 06/10/2019  . Lesion of bone of lumbosacral spine 05/28/2019  . Left-sided weakness 03/13/2016  . Cerebrovascular accident (CVA) ([place])   . Palpitations   . COPD (chronic obstructive pulmonary disease) ([place]) 08/20/2014  . Dyspnea 12/19/2013  . Chronic diastolic CHF (congestive heart failure) ([place]) 07/05/2013  . Lower extremity edema 07/05/2013  . Hyperlipidemia   . Chest pain 07/04/2013  . Hypokalemia 09/04/2012  . Precordial pain 09/04/2012  . Viral meningitis 01/22/2011  . Vomiting 01/22/2011  . PNEUMONIA, LEFT LOWER LOBE 10/10/2006  . DISEASE, ACUTE BRONCHOSPASM 10/10/2006  . Essential hypertension 05/23/2006  . OSTEOARTHRITIS 05/23/2006    Ailene Ravel, OTR/L,CBIS  [phone]  08/12/2020, 6:25 PM  [place] [address] [place], [place], [address] Phone: [phone]   Fax:  [phone]  Name: Hannah Cooper MRN: [id number] Date of Birth: 01-25-1956

## 2020-08-13 ENCOUNTER — Encounter: Payer: Self-pay | Admitting: Orthopedic Surgery

## 2020-08-13 ENCOUNTER — Ambulatory Visit (INDEPENDENT_AMBULATORY_CARE_PROVIDER_SITE_OTHER): Payer: No Typology Code available for payment source | Admitting: Orthopedic Surgery

## 2020-08-13 DIAGNOSIS — M75122 Complete rotator cuff tear or rupture of left shoulder, not specified as traumatic: Secondary | ICD-10-CM

## 2020-08-13 DIAGNOSIS — Z9889 Other specified postprocedural states: Secondary | ICD-10-CM

## 2020-08-13 DIAGNOSIS — G8918 Other acute postprocedural pain: Secondary | ICD-10-CM

## 2020-08-13 MED ORDER — HYDROCODONE-ACETAMINOPHEN 5-325 MG PO TABS: 1.0000 | ORAL_TABLET | Freq: Four times a day (QID) | ORAL | 0 refills | Status: DC | PRN

## 2020-08-13 MED ORDER — IBUPROFEN 800 MG PO TABS: 800.0000 mg | ORAL_TABLET | Freq: Three times a day (TID) | ORAL | 1 refills | Status: DC | PRN

## 2020-08-13 NOTE — Patient Instructions (Signed)
DRIVING IS OK   SLING AS NEEDED   IBUPROFEN(ADVIL) AND HYDROCODONE AND TIZANIDINE FOR PAIN

## 2020-08-13 NOTE — Progress Notes (Signed)
Chief Complaint  Patient presents with  . Post-op Follow-up    Left shoulder/ wants to know if she can drive and if she can start to come out of sling some.    Encounter Diagnoses  Name Primary?  . S/p left rotator cuff repair distal clavicle resection 07/03/20  Yes  . Nontraumatic complete tear of left rotator cuff   . Post-op pain     she's doing well, still requires some opioid medication   Her PT numbers look good  rec decrease opioid use and add ibuprofen and continue tizanidine and therapy see me in 4 weeks  She can drive   The sling can be used as needed

## 2020-08-14 ENCOUNTER — Encounter (HOSPITAL_COMMUNITY): Payer: Self-pay

## 2020-08-14 ENCOUNTER — Ambulatory Visit (HOSPITAL_COMMUNITY): Payer: PRIVATE HEALTH INSURANCE

## 2020-08-14 ENCOUNTER — Other Ambulatory Visit: Payer: Self-pay

## 2020-08-14 DIAGNOSIS — M25612 Stiffness of left shoulder, not elsewhere classified: Secondary | ICD-10-CM

## 2020-08-14 DIAGNOSIS — R29898 Other symptoms and signs involving the musculoskeletal system: Secondary | ICD-10-CM

## 2020-08-14 DIAGNOSIS — M25512 Pain in left shoulder: Secondary | ICD-10-CM

## 2020-08-14 NOTE — Therapy (Addendum)
[facility] [address] [place], [place], [address] Phone: [phone]   Fax:  [phone]  Occupational Therapy Treatment  Patient Details  Name: Hannah Cooper MRN: [id number] Date of Birth: 04-17-1956 Referring Provider (OT): Arther Abbott, MD  Progress Note Reporting Period 07/15/20 to 08/14/20  See note below for Objective Data and Assessment of Progress/Goals.       Encounter Date: 08/14/2020   OT End of Session - 08/14/20 1210    Visit Number 10    Number of Visits 24    Date for OT Re-Evaluation 10/07/20   mini reassess on 09/11/20   Authorization Type 1) [employer] 2) [employer]    Authorization Time Period no copay. no visit limit Fax initial evaluation for medical necessity.    Progress Note Due on Visit 20   OT Start Time 1035   pt arrived late   OT Stop Time 1108    OT Time Calculation (min) 33 min    Activity Tolerance Patient limited by pain;Patient tolerated treatment well    Behavior During Therapy [facility] for tasks assessed/performed           Past Medical History:  Diagnosis Date  . Arthritis   . Atypical chest pain    chronic  . Cancer ([place])    bone cancer  . Chronic back pain   . Chronic diastolic CHF (congestive heart failure) ([place])   . COPD (chronic obstructive pulmonary disease) ([place])   . DDD (degenerative disc disease), cervical   . History of radiation therapy 07/16/19-08/22/19   L4 spine ; Dr. Gery Pray  . Hyperlipidemia   . Hypertension   . Lumbar radiculopathy   . Meningitis   . Normal coronary arteries 2014  . Palpitations   . Premature atrial contractions   . PVC's (premature ventricular contractions)     Past Surgical History:  Procedure Laterality Date  . ABDOMINAL HYSTERECTOMY    . IR FLUORO GUIDED NEEDLE PLC ASPIRATION/INJECTION LOC  06/19/2019  . LEFT HEART CATHETERIZATION WITH CORONARY ANGIOGRAM N/A 09/05/2012   Procedure: LEFT HEART CATHETERIZATION WITH  CORONARY ANGIOGRAM;  Surgeon: Wellington Hampshire, MD;  Location: [facility] CATH [facility];  Service: Cardiovascular;  Laterality: N/A;  . RESECTION DISTAL CLAVICAL Left 07/03/2020   Procedure: RESECTION DISTAL CLAVICAL;  Surgeon: Carole Civil, MD;  Location: [facility];  Service: Orthopedics;  Laterality: Left;  . SHOULDER OPEN ROTATOR CUFF REPAIR Left 07/03/2020   Procedure: ROTATOR CUFF REPAIR SHOULDER OPEN WITH CHROMEOPLASTY;  Surgeon: Carole Civil, MD;  Location: [facility];  Service: Orthopedics;  Laterality: Left;  . THYROID SURGERY  2002    There were no vitals filed for this visit.   Subjective Assessment - 08/14/20 1038    Currently in Pain? Yes    Pain Score 6     Pain Location Shoulder    Pain Orientation Left    Pain Descriptors / Indicators Aching;Sore    Pain Type Surgical pain    Pain Onset 1 to 4 weeks ago    Pain Frequency Constant              [facility] OT Assessment - 08/14/20 1107      Assessment   Medical Diagnosis Left RTC repair      Precautions   Precautions Shoulder;Other (comment)    Type of Shoulder Precautions Standard RCR protocol   Sling on 4-6 weeks   0-6 weeks (3/18-4/29)-: PROM, pendulum   6-12 weeks (4/29-6/10): AAROM  progressing to AROM   12-24 weeks: Strengthening exercises    Shoulder Interventions For comfort    Precaution Comments History of Plasmocytoma.                    OT Treatments/Exercises (OP) - 08/14/20 1105      Exercises   Exercises Shoulder      Shoulder Exercises: Supine   Protraction PROM;AAROM;5 reps   2 sets of 5 for AA/ROM   Protraction Limitations 20% range achieved    Horizontal ABduction PROM;5 reps    External Rotation PROM;5 reps;AAROM;10 reps    Internal Rotation PROM;5 reps;AAROM;10 reps    Flexion PROM;5 reps      Shoulder Exercises: Standing   Other Standing Exercises countertop wash: flexion 10X      Manual Therapy   Manual Therapy Myofascial release    Manual therapy comments manual therapy  completed seperately from all other interventions this date    Myofascial Release myofascial release and manual stretching to left upper arm, trapezius, and scapular regions to decrease pain and fascial restrictions and increase joint ROM                    OT Short Term Goals - 07/17/20 1213      OT SHORT TERM GOAL #1   Title Patient will be educated and independent with HEP in order to facilitate her progress in therapy and begin to use her LUE as her non dominant extremity for 50% or more of daily tasks.    Time 6    Period Weeks    Status On-going    Target Date 08/26/20      OT SHORT TERM GOAL #2   Title patient will increase her LUE P/ROM to [facility] in order to increase ability to complete upper body dressing tasks.    Time 6    Period Weeks    Status On-going      OT SHORT TERM GOAL #3   Title Patient will increase her LUE strength to 3/5 in order to complete functional waist level self care tasks with less difficulty.    Time 6    Period Weeks    Status On-going      OT SHORT TERM GOAL #4   Title Patient will report a decrease in pain for her LUE of approximately 6/10 or less while completing dressing and bathing tasks.    Time 6    Period Weeks    Status On-going      OT SHORT TERM GOAL #5   Title Patient will decrease LUE fascial restrictions to moderate amount in order to increase her functional mobility needed to complete low level reaching tasks.    Time 6    Period Weeks    Status On-going             OT Long Term Goals - 07/17/20 1213      OT LONG TERM GOAL #1   Title Patient will increase her LUE A/ROM to [facility] in order to complete reaching tasks at or above her shoulder level.    Time 12    Period Weeks    Status On-going      OT LONG TERM GOAL #2   Title Patient will increase her LUE strength to 4/5 or better in order to return to household lifting tasks while using her LUE.    Time 12    Period Weeks    Status On-going  OT LONG TERM  GOAL #3   Title Patient will report a decrease in pain level in her LUE to 3/10 or less while completing daily tasks.    Time 12    Period Weeks    Status On-going      OT LONG TERM GOAL #4   Title Patient will decrease her LUE fascial restrictions to min amount or less in order to increase the functional mobility needed to complete reaching tasks.    Time 12    Period Weeks    Status On-going                 Plan - 08/14/20 1211    Clinical Impression Statement A: Pt arrived without sling on and had forgotten it at home. Continued to progress P/ROM as patient is able to tolerate with breathing techniques and relaxation techniques. VC provided for all exercises for form and technique. Manual techniques completed to address fascial restrictions along left upper arm and upper trapezius. Remained clear of healed incision due to hypersensitivity. Pt reports that she has been using the cotton ball along the scar to desensitize. Progressed AA/ROM protraction and flexion supine.    Occupational performance deficits (Please refer to evaluation for details): IADL's;ADL's;Rest and Sleep;Leisure    Body Structure / Function / Physical Skills ADL;UE functional use;Fascial restriction;Pain;ROM;Decreased knowledge of precautions;Strength;Mobility    Plan P: Provide handout on scar desensitization ( I forgot to last session). Add horizontal abduction/adduction on countertop and circles both directions. Continue to progress AA/ROM supine in all ranges as able to tolerate.    Consulted and Agree with Plan of Care Patient           Patient will benefit from skilled therapeutic intervention in order to improve the following deficits and impairments:   Body Structure / Function / Physical Skills: ADL,UE functional use,Fascial restriction,Pain,ROM,Decreased knowledge of precautions,Strength,Mobility       Visit Diagnosis: Other symptoms and signs involving the musculoskeletal system  Stiffness of  left shoulder, not elsewhere classified  Acute pain of left shoulder    Problem List Patient Active Problem List   Diagnosis Date Noted  . S/p left rotator cuff repair distal clavicle resection 07/03/20  07/07/2020  . Complete tear of left rotator cuff   . 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[place]) 07/10/2019  . Plasma cell disorder 06/10/2019  . Lesion of bone of lumbosacral spine 05/28/2019  . Left-sided weakness 03/13/2016  . Cerebrovascular accident (CVA) ([place])   . Palpitations   . COPD (chronic obstructive pulmonary disease) ([place]) 08/20/2014  . Dyspnea 12/19/2013  . Chronic diastolic CHF (congestive heart failure) ([place]) 07/05/2013  . Lower extremity edema 07/05/2013  . Hyperlipidemia   . Chest pain 07/04/2013  . Hypokalemia 09/04/2012  . Precordial pain 09/04/2012  . Viral meningitis 01/22/2011  . Vomiting 01/22/2011  . PNEUMONIA, LEFT LOWER LOBE 10/10/2006  . DISEASE, ACUTE BRONCHOSPASM 10/10/2006  . Essential hypertension 05/23/2006  . OSTEOARTHRITIS 05/23/2006    Ailene Ravel, OTR/L,CBIS  [phone]  08/14/2020, 12:14 PM  [facility] [address] [place], [place], [address] Phone: [phone]   Fax:  [phone]  Name: LAQUASIA PINCUS MRN: [id number] Date of Birth: [birth year]

## 2020-08-18 ENCOUNTER — Other Ambulatory Visit: Payer: Self-pay

## 2020-08-18 ENCOUNTER — Ambulatory Visit (HOSPITAL_COMMUNITY): Payer: PRIVATE HEALTH INSURANCE | Attending: Orthopedic Surgery | Admitting: Occupational Therapy

## 2020-08-18 ENCOUNTER — Encounter (HOSPITAL_COMMUNITY): Payer: Self-pay | Admitting: Occupational Therapy

## 2020-08-18 DIAGNOSIS — M25512 Pain in left shoulder: Secondary | ICD-10-CM

## 2020-08-18 DIAGNOSIS — R29898 Other symptoms and signs involving the musculoskeletal system: Secondary | ICD-10-CM | POA: Diagnosis not present

## 2020-08-18 DIAGNOSIS — M25612 Stiffness of left shoulder, not elsewhere classified: Secondary | ICD-10-CM

## 2020-08-18 NOTE — Therapy (Signed)
[facility] [place], [place], [address] Phone: [phone]   Fax:  [phone]  Occupational Therapy Treatment  Patient Details  Name: Hannah Cooper MRN: [id number] Date of Birth: 1956/03/30 Referring Provider (OT): Arther Abbott, MD   Encounter Date: 08/18/2020   OT End of Session - 08/18/20 1517    Visit Number 11    Number of Visits 24    Date for OT Re-Evaluation 10/07/20   mini reassess on 09/11/20   Authorization Type 1) [employer] 2) [employer]    Authorization Time Period no copay. no visit limit Fax initial evaluation for medical necessity.    Progress Note Due on Visit 19    OT Start Time 1602    OT Stop Time 1644    OT Time Calculation (min) 42 min    Activity Tolerance Patient limited by pain;Patient tolerated treatment well    Behavior During Therapy [facility] for tasks assessed/performed           Past Medical History:  Diagnosis Date  . Arthritis   . Atypical chest pain    chronic  . Cancer ([place])    bone cancer  . Chronic back pain   . Chronic diastolic CHF (congestive heart failure) ([place])   . COPD (chronic obstructive pulmonary disease) ([place])   . DDD (degenerative disc disease), cervical   . History of radiation therapy 07/16/19-08/22/19   L4 spine ; Dr. Gery Pray  . Hyperlipidemia   . Hypertension   . Lumbar radiculopathy   . Meningitis   . Normal coronary arteries 2014  . Palpitations   . Premature atrial contractions   . PVC's (premature ventricular contractions)     Past Surgical History:  Procedure Laterality Date  . ABDOMINAL HYSTERECTOMY    . IR FLUORO GUIDED NEEDLE PLC ASPIRATION/INJECTION LOC  06/19/2019  . LEFT HEART CATHETERIZATION WITH CORONARY ANGIOGRAM N/A 09/05/2012   Procedure: LEFT HEART CATHETERIZATION WITH CORONARY ANGIOGRAM;  Surgeon: Wellington Hampshire, MD;  Location: [facility] CATH [facility];  Service: Cardiovascular;  Laterality: N/A;  . RESECTION DISTAL CLAVICAL  Left 07/03/2020   Procedure: RESECTION DISTAL CLAVICAL;  Surgeon: Carole Civil, MD;  Location: [facility];  Service: Orthopedics;  Laterality: Left;  . SHOULDER OPEN ROTATOR CUFF REPAIR Left 07/03/2020   Procedure: ROTATOR CUFF REPAIR SHOULDER OPEN WITH CHROMEOPLASTY;  Surgeon: Carole Civil, MD;  Location: [facility];  Service: Orthopedics;  Laterality: Left;  . THYROID SURGERY  2002    There were no vitals filed for this visit.   Subjective Assessment - 08/18/20 1603    Subjective  S: I take my sling off some at home.    Currently in Pain? Yes    Pain Score 6     Pain Location Shoulder    Pain Orientation Left    Pain Descriptors / Indicators Aching;Sore    Pain Type Acute pain    Pain Radiating Towards down arm    Pain Onset 1 to 4 weeks ago    Pain Frequency Intermittent    Aggravating Factors  nothing    Pain Relieving Factors pain medication, ice    Effect of Pain on Daily Activities max effect on ADLs    Multiple Pain Sites No              [facility] OT Assessment - 08/18/20 1603      Assessment   Medical Diagnosis Left RTC repair      Precautions  Precautions Shoulder;Other (comment)    Type of Shoulder Precautions Standard RCR protocol   Sling on 4-6 weeks   0-6 weeks (3/18-4/29)-: PROM, pendulum   6-12 weeks (4/29-6/10): AAROM progressing to AROM   12-24 weeks: Strengthening exercises    Shoulder Interventions For comfort    Precaution Comments History of Plasmocytoma.                    OT Treatments/Exercises (OP) - 08/18/20 1604      Exercises   Exercises Shoulder      Shoulder Exercises: Supine   Protraction PROM;5 reps;AAROM;10 reps    Horizontal ABduction PROM;5 reps    External Rotation PROM;5 reps;AAROM;10 reps    Internal Rotation PROM;5 reps;AAROM;10 reps    Flexion PROM;5 reps;AAROM;10 reps    ABduction PROM;5 reps      Shoulder Exercises: ROM/Strengthening   Thumb Tacks 1' low level    Other ROM/Strengthening Exercises Table  slides: flexion, horizontal abduction, 1' each      Manual Therapy   Manual Therapy Myofascial release    Manual therapy comments manual therapy completed seperately from all other interventions this date    Myofascial Release myofascial release and manual stretching to left upper arm, trapezius, and scapular regions to decrease pain and fascial restrictions and increase joint ROM                    OT Short Term Goals - 07/17/20 1213      OT SHORT TERM GOAL #1   Title Patient will be educated and independent with HEP in order to facilitate her progress in therapy and begin to use her LUE as her non dominant extremity for 50% or more of daily tasks.    Time 6    Period Weeks    Status On-going    Target Date 08/26/20      OT SHORT TERM GOAL #2   Title patient will increase her LUE P/ROM to [facility] in order to increase ability to complete upper body dressing tasks.    Time 6    Period Weeks    Status On-going      OT SHORT TERM GOAL #3   Title Patient will increase her LUE strength to 3/5 in order to complete functional waist level self care tasks with less difficulty.    Time 6    Period Weeks    Status On-going      OT SHORT TERM GOAL #4   Title Patient will report a decrease in pain for her LUE of approximately 6/10 or less while completing dressing and bathing tasks.    Time 6    Period Weeks    Status On-going      OT SHORT TERM GOAL #5   Title Patient will decrease LUE fascial restrictions to moderate amount in order to increase her functional mobility needed to complete low level reaching tasks.    Time 6    Period Weeks    Status On-going             OT Long Term Goals - 07/17/20 1213      OT LONG TERM GOAL #1   Title Patient will increase her LUE A/ROM to [facility] in order to complete reaching tasks at or above her shoulder level.    Time 12    Period Weeks    Status On-going      OT LONG TERM GOAL #2   Title Patient will increase  her LUE strength to  4/5 or better in order to return to household lifting tasks while using her LUE.    Time 12    Period Weeks    Status On-going      OT LONG TERM GOAL #3   Title Patient will report a decrease in pain level in her LUE to 3/10 or less while completing daily tasks.    Time 12    Period Weeks    Status On-going      OT LONG TERM GOAL #4   Title Patient will decrease her LUE fascial restrictions to min amount or less in order to increase the functional mobility needed to complete reaching tasks.    Time 12    Period Weeks    Status On-going                 Plan - 08/18/20 1637    Clinical Impression Statement A: Pt reporting she is wearing her sling less at home, puts it on when she gets tired. Continued with myofascial release to address fascial restrictions, passive stretching with pt tolerate approximately 60-75% ROM today. Continued with AA/ROM supine, adding flexion today. Added countertop wash for horizontal abduction, tumb tacks. Verbal cuing for form and technique.    Body Structure / Function / Physical Skills ADL;UE functional use;Fascial restriction;Pain;ROM;Decreased knowledge of precautions;Strength;Mobility    Plan P: Add horizontal abduction AA/ROM in supine, attempt low level prot/ret/elev/dep    OT Home Exercise Plan eval: A/ROM elbow, wrist, forearm, seated A/ROM scapular exercises; 4/6: pendulums; 4/15: table slides 4/27: AA/ROM er supine    Consulted and Agree with Plan of Care Patient           Patient will benefit from skilled therapeutic intervention in order to improve the following deficits and impairments:   Body Structure / Function / Physical Skills: ADL,UE functional use,Fascial restriction,Pain,ROM,Decreased knowledge of precautions,Strength,Mobility       Visit Diagnosis: Other symptoms and signs involving the musculoskeletal system  Stiffness of left shoulder, not elsewhere classified  Acute pain of left shoulder    Problem List Patient  Active Problem List   Diagnosis Date Noted  . S/p left rotator cuff repair distal clavicle resection 07/03/20  07/07/2020  . Complete tear of left rotator cuff   . 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 Plasma cell disorder 06/10/2019  . Lesion of bone of lumbosacral spine 05/28/2019  . Left-sided weakness 03/13/2016  . Cerebrovascular accident (CVA) ([place])   . Palpitations   . COPD (chronic obstructive pulmonary disease) ([place]) 08/20/2014  . Dyspnea 12/19/2013  . Chronic diastolic CHF (congestive heart failure) ([place]) 07/05/2013  . Lower extremity edema 07/05/2013  . Hyperlipidemia   . Chest pain 07/04/2013  . Hypokalemia 09/04/2012  . Precordial pain 09/04/2012  . Viral meningitis 01/22/2011  . Vomiting 01/22/2011  . PNEUMONIA, LEFT LOWER LOBE 10/10/2006  . DISEASE, ACUTE BRONCHOSPASM 10/10/2006  . Essential hypertension 05/23/2006  . OSTEOARTHRITIS 05/23/2006   Guadelupe Sabin, OTR/L  [phone] 08/18/2020, 4:47 PM  [facility] [address] [place], [place], [address] Phone: [phone]   Fax:  [phone]  Name: Hannah Cooper MRN: [id number] Date of Birth: 16-Mar-1956

## 2020-08-20 ENCOUNTER — Ambulatory Visit (HOSPITAL_COMMUNITY): Payer: PRIVATE HEALTH INSURANCE

## 2020-08-20 ENCOUNTER — Other Ambulatory Visit: Payer: Self-pay

## 2020-08-20 ENCOUNTER — Encounter (HOSPITAL_COMMUNITY): Payer: Self-pay

## 2020-08-20 DIAGNOSIS — M25512 Pain in left shoulder: Secondary | ICD-10-CM

## 2020-08-20 DIAGNOSIS — R29898 Other symptoms and signs involving the musculoskeletal system: Secondary | ICD-10-CM | POA: Diagnosis not present

## 2020-08-20 DIAGNOSIS — M25612 Stiffness of left shoulder, not elsewhere classified: Secondary | ICD-10-CM

## 2020-08-20 NOTE — Patient Instructions (Signed)
Perform each exercise ____10-15____ reps. 2-3x days.  ° °1) Protraction  ° °Start by holding a wand or cane at chest height. ° °Next, slowly push the wand outwards in front of your body so that your elbows become fully straightened. Then, return to the original position.  ° ° ° °2) Shoulder FLEXION  ° °In the standing position, hold a wand/cane with both arms, palms down on both sides. Raise up the wand/cane allowing your unaffected arm to perform most of the effort. Your affected arm should be partially relaxed.   ° ° ° °3) Internal/External ROTATION  ° °In the standing position, hold a wand/cane with both hands keeping your elbows bent. Move your arms and wand/cane to one side.  Your affected arm should be partially relaxed while your unaffected arm performs most of the effort.   ° ° ° ° ° ° °5) Horizontal Abduction/Adduction ° ° ° ° ° °Straight arms holding cane at shoulder height, bring cane to right, center, left. Repeat starting to left. ° ° °Copyright © [employer]. All rights reserved.  ° ° ° ° °

## 2020-08-20 NOTE — Therapy (Signed)
[place], [place], [address] Phone: [phone]   Fax:  [phone]  Occupational Therapy Treatment  Patient Details  Name: Hannah Cooper MRN: [id number] Date of Birth: [birth year] Referring Provider (OT): Arther Abbott, MD   Encounter Date: 08/20/2020   OT End of Session - 08/20/20 1130    Visit Number 12    Number of Visits 24    Date for OT Re-Evaluation 10/07/20   mini reassess on 09/11/20   Authorization Type 1) [employer] 2) [employer]    Authorization Time Period no copay. no visit limit Fax initial evaluation for medical necessity.    Progress Note Due on Visit 19    OT Start Time [phone]   pt arrived late   OT Stop Time 0857    OT Time Calculation (min) 34 min    Activity Tolerance Patient limited by pain;Patient tolerated treatment well    Behavior During Therapy [place] for tasks assessed/performed           Past Medical History:  Diagnosis Date  . Arthritis   . Atypical chest pain    chronic  . Cancer ([place])    bone cancer  . Chronic back pain   . Chronic diastolic CHF (congestive heart failure) ([place])   . COPD (chronic obstructive pulmonary disease) ([place])   . DDD (degenerative disc disease), cervical   . History of radiation therapy 07/16/19-08/22/19   L4 spine ; Dr. Gery Pray  . Hyperlipidemia   . Hypertension   . Lumbar radiculopathy   . Meningitis   . Normal coronary arteries 2014  . Palpitations   . Premature atrial contractions   . PVC's (premature ventricular contractions)     Past Surgical History:  Procedure Laterality Date  . ABDOMINAL HYSTERECTOMY    . IR FLUORO GUIDED NEEDLE PLC ASPIRATION/INJECTION LOC  06/19/2019  . LEFT HEART CATHETERIZATION WITH CORONARY ANGIOGRAM N/A 09/05/2012   Procedure: LEFT HEART CATHETERIZATION WITH CORONARY ANGIOGRAM;  Surgeon: Wellington Hampshire, MD;  Location: [facility];  Service: Cardiovascular;  Laterality: N/A;  . RESECTION  DISTAL CLAVICAL Left 07/03/2020   Procedure: RESECTION DISTAL CLAVICAL;  Surgeon: Carole Civil, MD;  Location: [facility];  Service: Orthopedics;  Laterality: Left;  . SHOULDER OPEN ROTATOR CUFF REPAIR Left 07/03/2020   Procedure: ROTATOR CUFF REPAIR SHOULDER OPEN WITH CHROMEOPLASTY;  Surgeon: Carole Civil, MD;  Location: [facility];  Service: Orthopedics;  Laterality: Left;  . THYROID SURGERY  2002    There were no vitals filed for this visit.   Subjective Assessment - 08/20/20 1053    Subjective  S: I did something stupid and reached back for my seatbelt quick with this left arm and I felt something pop.    Currently in Pain? Yes    Pain Score 6     Pain Location Shoulder    Pain Orientation Left    Pain Descriptors / Indicators Aching;Sore    Pain Type Acute pain    Pain Onset 1 to 4 weeks ago    Pain Frequency Intermittent    Aggravating Factors  reaching back for seatbelt when in the car (external rotation)    Pain Relieving Factors pain medication, ice    Effect of Pain on Daily Activities max effect    Multiple Pain Sites No              [facility] OT Assessment - 08/20/20 0854      Assessment  Medical Diagnosis Left RTC repair      Precautions   Precautions Shoulder;Other (comment)    Type of Shoulder Precautions Standard RCR protocol   Sling on 4-6 weeks   0-6 weeks (3/18-4/29)-: PROM, pendulum   6-12 weeks (4/29-6/10): AAROM progressing to AROM   12-24 weeks: Strengthening exercises    Shoulder Interventions For comfort    Precaution Comments History of Plasmocytoma.                    OT Treatments/Exercises (OP) - 08/20/20 0840      Exercises   Exercises Shoulder      Shoulder Exercises: Supine   Protraction PROM;5 reps;AAROM;10 reps    Horizontal ABduction PROM;5 reps;AAROM;10 reps    External Rotation PROM;5 reps;AAROM;10 reps    Internal Rotation PROM;5 reps;AAROM;10 reps    Flexion PROM;5 reps;AAROM;10 reps    ABduction PROM;5 reps       Shoulder Exercises: Standing   Row AROM;10 reps      Shoulder Exercises: ROM/Strengthening   Thumb Tacks 1'    Prot/Ret//Elev/Dep 1'    Other ROM/Strengthening Exercises PVC pipe slide; 10X flexion                  OT Education - 08/20/20 1130    Education provided Yes    Education Details Updated Supine AA/ROM HEP with abduction omitted.    Person(s) Educated Patient    Methods Explanation;Demonstration;Handout;Verbal cues;Tactile cues    Comprehension Verbalized understanding;Returned demonstration            OT Short Term Goals - 07/17/20 1213      OT SHORT TERM GOAL #1   Title Patient will be educated and independent with HEP in order to facilitate her progress in therapy and begin to use her LUE as her non dominant extremity for 50% or more of daily tasks.    Time 6    Period Weeks    Status On-going    Target Date 08/26/20      OT SHORT TERM GOAL #2   Title patient will increase her LUE P/ROM to [facility] in order to increase ability to complete upper body dressing tasks.    Time 6    Period Weeks    Status On-going      OT SHORT TERM GOAL #3   Title Patient will increase her LUE strength to 3/5 in order to complete functional waist level self care tasks with less difficulty.    Time 6    Period Weeks    Status On-going      OT SHORT TERM GOAL #4   Title Patient will report a decrease in pain for her LUE of approximately 6/10 or less while completing dressing and bathing tasks.    Time 6    Period Weeks    Status On-going      OT SHORT TERM GOAL #5   Title Patient will decrease LUE fascial restrictions to moderate amount in order to increase her functional mobility needed to complete low level reaching tasks.    Time 6    Period Weeks    Status On-going             OT Long Term Goals - 07/17/20 1213      OT LONG TERM GOAL #1   Title Patient will increase her LUE A/ROM to [facility] in order to complete reaching tasks at or above her shoulder level.     Time 12  Period Weeks    Status On-going      OT LONG TERM GOAL #2   Title Patient will increase her LUE strength to 4/5 or better in order to return to household lifting tasks while using her LUE.    Time 12    Period Weeks    Status On-going      OT LONG TERM GOAL #3   Title Patient will report a decrease in pain level in her LUE to 3/10 or less while completing daily tasks.    Time 12    Period Weeks    Status On-going      OT LONG TERM GOAL #4   Title Patient will decrease her LUE fascial restrictions to min amount or less in order to increase the functional mobility needed to complete reaching tasks.    Time 12    Period Weeks    Status On-going                 Plan - 08/20/20 1131    Clinical Impression Statement A: Reports reaching back for her seatbelt when in the car yesterday and experienced a pop and pain. She has been wearing her sling for comfort. Continues to be limited with P/ROM achieving approximately 50% range during flexion, abduction and 25% with er and horizontal abduction. Education completed during session to utilize positive talking/thinking while giving herself words of encouragement when completing exercises versus telling herself she can't, it's hard, or it'll hurt for example. Added supine horizontal abduction, PVC pipe slide, and pro/ret/elev/dep. VC for form and technique were provided.    Body Structure / Function / Physical Skills ADL;UE functional use;Fascial restriction;Pain;ROM;Decreased knowledge of precautions;Strength;Mobility    Plan P: Continue with AA/ROM progressing to standing when able to tolerate. Try a incline type of wall wash versus vertical.    OT Home Exercise Plan eval: A/ROM elbow, wrist, forearm, seated A/ROM scapular exercises; 4/6: pendulums; 4/15: table slides 4/27: AA/ROM er supine 5/5: AA/ROm supine except abduction    Consulted and Agree with Plan of Care Patient           Patient will benefit from skilled  therapeutic intervention in order to improve the following deficits and impairments:   Body Structure / Function / Physical Skills: ADL,UE functional use,Fascial restriction,Pain,ROM,Decreased knowledge of precautions,Strength,Mobility       Visit Diagnosis: Other symptoms and signs involving the musculoskeletal system  Stiffness of left shoulder, not elsewhere classified  Acute pain of left shoulder    Problem List Patient Active Problem List   Diagnosis Date Noted  . S/p left rotator cuff repair distal clavicle resection 07/03/20  07/07/2020  . Complete tear of left rotator cuff   . 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[place]) 07/10/2019  . Plasma cell disorder 06/10/2019  . Lesion of bone of lumbosacral spine 05/28/2019  . Left-sided weakness 03/13/2016  . Cerebrovascular accident (CVA) ([place])   . Palpitations   . COPD (chronic obstructive pulmonary disease) ([place]) 08/20/2014  . Dyspnea 12/19/2013  . Chronic diastolic CHF (congestive heart failure) ([place]) 07/05/2013  . Lower extremity edema 07/05/2013  . Hyperlipidemia   . Chest pain 07/04/2013  . Hypokalemia 09/04/2012  . Precordial pain 09/04/2012  . Viral meningitis 01/22/2011  . Vomiting 01/22/2011  . PNEUMONIA, LEFT LOWER LOBE 10/10/2006  . DISEASE, ACUTE BRONCHOSPASM 10/10/2006  . Essential hypertension 05/23/2006  . OSTEOARTHRITIS 05/23/2006    Ailene Ravel, OTR/L,CBIS  [phone]  08/20/2020, 11:36 AM  [facility]  [place], [place], [address] Phone: [phone]   Fax:  [phone]  Name: Hannah Cooper MRN: [id number] Date of Birth: March 11, 1956

## 2020-08-25 ENCOUNTER — Encounter (HOSPITAL_COMMUNITY): Payer: Self-pay

## 2020-08-25 ENCOUNTER — Other Ambulatory Visit: Payer: Self-pay

## 2020-08-25 ENCOUNTER — Ambulatory Visit (HOSPITAL_COMMUNITY): Payer: PRIVATE HEALTH INSURANCE

## 2020-08-25 DIAGNOSIS — M25512 Pain in left shoulder: Secondary | ICD-10-CM

## 2020-08-25 DIAGNOSIS — R29898 Other symptoms and signs involving the musculoskeletal system: Secondary | ICD-10-CM | POA: Diagnosis not present

## 2020-08-25 DIAGNOSIS — M25612 Stiffness of left shoulder, not elsewhere classified: Secondary | ICD-10-CM

## 2020-08-25 NOTE — Patient Instructions (Signed)
Perform each exercise ____10-12____ reps. 2-3x days.   1) Protraction   Start by holding a wand or cane at chest height.  Next, slowly push the wand outwards in front of your body so that your elbows become fully straightened. Then, return to the original position.     2) Shoulder FLEXION   In the standing position, hold a wand/cane with both arms, palms down on both sides. Raise up the wand/cane allowing your unaffected arm to perform most of the effort. Your affected arm should be partially relaxed.         4) Shoulder ABDUCTION   While holding a wand/cane palm face up on the injured side and palm face down on the uninjured side, slowly raise up your injured arm to the side.

## 2020-08-26 NOTE — Therapy (Signed)
[place], [place], [address] Phone: [phone]   Fax:  [phone]  Occupational Therapy Treatment  Patient Details  Name: Hannah Cooper MRN: [id number] Date of Birth: 1955-10-20 Referring Provider (OT): Arther Abbott, MD   Encounter Date: 08/25/2020   OT End of Session - 08/26/20 1333    Visit Number 13    Number of Visits 24    Date for OT Re-Evaluation 10/07/20   mini reassess on 09/11/20   Authorization Type 1) [employer] 2) [employer]    Authorization Time Period no copay. no visit limit Fax initial evaluation for medical necessity.    Progress Note Due on Visit 75    OT Start Time 1649   checked in late   OT Stop Time 1723    OT Time Calculation (min) 34 min    Activity Tolerance Patient tolerated treatment well    Behavior During Therapy [facility] for tasks assessed/performed           Past Medical History:  Diagnosis Date  . Arthritis   . Atypical chest pain    chronic  . Cancer ([place])    bone cancer  . Chronic back pain   . Chronic diastolic CHF (congestive heart failure) ([place])   . COPD (chronic obstructive pulmonary disease) ([place])   . DDD (degenerative disc disease), cervical   . History of radiation therapy 07/16/19-08/22/19   L4 spine ; Dr. Gery Pray  . Hyperlipidemia   . Hypertension   . Lumbar radiculopathy   . Meningitis   . Normal coronary arteries 2014  . Palpitations   . Premature atrial contractions   . PVC's (premature ventricular contractions)     Past Surgical History:  Procedure Laterality Date  . ABDOMINAL HYSTERECTOMY    . IR FLUORO GUIDED NEEDLE PLC ASPIRATION/INJECTION LOC  06/19/2019  . LEFT HEART CATHETERIZATION WITH CORONARY ANGIOGRAM N/A 09/05/2012   Procedure: LEFT HEART CATHETERIZATION WITH CORONARY ANGIOGRAM;  Surgeon: Wellington Hampshire, MD;  Location: [facility];  Service: Cardiovascular;  Laterality: N/A;  . RESECTION DISTAL CLAVICAL Left  07/03/2020   Procedure: RESECTION DISTAL CLAVICAL;  Surgeon: Carole Civil, MD;  Location: [facility];  Service: Orthopedics;  Laterality: Left;  . SHOULDER OPEN ROTATOR CUFF REPAIR Left 07/03/2020   Procedure: ROTATOR CUFF REPAIR SHOULDER OPEN WITH CHROMEOPLASTY;  Surgeon: Carole Civil, MD;  Location: [facility];  Service: Orthopedics;  Laterality: Left;  . THYROID SURGERY  2002    There were no vitals filed for this visit.   Subjective Assessment - 08/25/20 1711    Subjective  S: It's feeling a lot better than what it was.    Currently in Pain? Yes    Pain Score 5     Pain Location Shoulder    Pain Orientation Left    Pain Descriptors / Indicators Aching;Sore    Pain Type Acute pain    Pain Onset In the past 7 days    Pain Frequency Constant    Aggravating Factors  jerking movements    Pain Relieving Factors pain medication, ice    Effect of Pain on Daily Activities max effect              [facility] OT Assessment - 08/25/20 1713      Assessment   Medical Diagnosis Left RTC repair      Precautions   Precautions Shoulder;Other (comment)    Type of Shoulder Precautions Standard RCR protocol  Sling on 4-6 weeks   0-6 weeks (3/18-4/29)-: PROM, pendulum   6-12 weeks (4/29-6/10): AAROM progressing to AROM   12-24 weeks: Strengthening exercises    Shoulder Interventions For comfort    Precaution Comments History of Plasmocytoma.                    OT Treatments/Exercises (OP) - 08/25/20 1713      Exercises   Exercises Shoulder      Shoulder Exercises: Supine   Protraction PROM;5 reps;AAROM;10 reps    Horizontal ABduction PROM;5 reps;AAROM;10 reps    External Rotation PROM;5 reps;AAROM;10 reps    Internal Rotation PROM;5 reps;AAROM;10 reps    Flexion PROM;5 reps;AAROM;10 reps    ABduction PROM;5 reps      Shoulder Exercises: Standing   Protraction AAROM;10 reps    Flexion AAROM;10 reps    ABduction AAROM;10 reps      Manual Therapy   Manual Therapy  Myofascial release    Manual therapy comments manual therapy completed seperately from all other interventions this date    Myofascial Release myofascial release and manual stretching to left upper arm, trapezius, and scapular regions to decrease pain and fascial restrictions and increase joint ROM                  OT Education - 08/26/20 1333    Education provided Yes    Education Details Updated HEP to include standing AA/ROM flexion, protraction, abduction    Person(s) Educated Patient    Methods Explanation;Demonstration;Verbal cues;Handout    Comprehension Verbalized understanding;Returned demonstration            OT Short Term Goals - 07/17/20 1213      OT SHORT TERM GOAL #1   Title Patient will be educated and independent with HEP in order to facilitate her progress in therapy and begin to use her LUE as her non dominant extremity for 50% or more of daily tasks.    Time 6    Period Weeks    Status On-going    Target Date 08/26/20      OT SHORT TERM GOAL #2   Title patient will increase her LUE P/ROM to [facility] in order to increase ability to complete upper body dressing tasks.    Time 6    Period Weeks    Status On-going      OT SHORT TERM GOAL #3   Title Patient will increase her LUE strength to 3/5 in order to complete functional waist level self care tasks with less difficulty.    Time 6    Period Weeks    Status On-going      OT SHORT TERM GOAL #4   Title Patient will report a decrease in pain for her LUE of approximately 6/10 or less while completing dressing and bathing tasks.    Time 6    Period Weeks    Status On-going      OT SHORT TERM GOAL #5   Title Patient will decrease LUE fascial restrictions to moderate amount in order to increase her functional mobility needed to complete low level reaching tasks.    Time 6    Period Weeks    Status On-going             OT Long Term Goals - 07/17/20 1213      OT LONG TERM GOAL #1   Title Patient  will increase her LUE A/ROM to [facility] in order to complete reaching tasks  at or above her shoulder level.    Time 12    Period Weeks    Status On-going      OT LONG TERM GOAL #2   Title Patient will increase her LUE strength to 4/5 or better in order to return to household lifting tasks while using her LUE.    Time 12    Period Weeks    Status On-going      OT LONG TERM GOAL #3   Title Patient will report a decrease in pain level in her LUE to 3/10 or less while completing daily tasks.    Time 12    Period Weeks    Status On-going      OT LONG TERM GOAL #4   Title Patient will decrease her LUE fascial restrictions to min amount or less in order to increase the functional mobility needed to complete reaching tasks.    Time 12    Period Weeks    Status On-going                 Plan - 08/26/20 1335    Clinical Impression Statement A: Pt demonstrates increased ROM during passive and active assisted exercises this date. Less fascial restrictions palpated in the left upper arm and upper trapezius. Manual techniques were completed to address. Able to progress to standing AA/ROM and completed protraction, flexion, and abduction with HEP updated. VC for form and technique were provided.    Body Structure / Function / Physical Skills ADL;UE functional use;Fascial restriction;Pain;ROM;Decreased knowledge of precautions;Strength;Mobility    Plan P: Complete remainder of AA/ROM standing exercises. May not be able to tolerate horizontal abduction.    OT Home Exercise Plan eval: A/ROM elbow, wrist, forearm, seated A/ROM scapular exercises; 4/6: pendulums; 4/15: table slides 4/27: AA/ROM er supine 5/5: AA/ROm supine except abduction 5/10: standing AA/ROM protraction, abduction, flexion    Consulted and Agree with Plan of Care Patient           Patient will benefit from skilled therapeutic intervention in order to improve the following deficits and impairments:   Body Structure / Function /  Physical Skills: ADL,UE functional use,Fascial restriction,Pain,ROM,Decreased knowledge of precautions,Strength,Mobility       Visit Diagnosis: Stiffness of left shoulder, not elsewhere classified  Acute pain of left shoulder  Other symptoms and signs involving the musculoskeletal system    Problem List Patient Active Problem List   Diagnosis Date Noted  . S/p left rotator cuff repair distal clavicle resection 07/03/20  07/07/2020  . Complete tear of left rotator cuff   . 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 Plasma cell disorder 06/10/2019  . Lesion of bone of lumbosacral spine 05/28/2019  . Left-sided weakness 03/13/2016  . Cerebrovascular accident (CVA) ([place])   . Palpitations   . COPD (chronic obstructive pulmonary disease) ([place]) 08/20/2014  . Dyspnea 12/19/2013  . Chronic diastolic CHF (congestive heart failure) ([place]) 07/05/2013  . Lower extremity edema 07/05/2013  . Hyperlipidemia   . Chest pain 07/04/2013  . Hypokalemia 09/04/2012  . Precordial pain 09/04/2012  . Viral meningitis 01/22/2011  . Vomiting 01/22/2011  . PNEUMONIA, LEFT LOWER LOBE 10/10/2006  . DISEASE, ACUTE BRONCHOSPASM 10/10/2006  . Essential hypertension 05/23/2006  . OSTEOARTHRITIS 05/23/2006   Ailene Ravel, OTR/L,CBIS  [phone]  08/26/2020, 1:38 PM  [facility] [address] [place], [place], [address] Phone: [phone]   Fax:  [phone]  Name: RENI HAUSNER MRN: [id number] Date  of Birth: [birth year]

## 2020-08-27 ENCOUNTER — Ambulatory Visit (HOSPITAL_COMMUNITY): Payer: PRIVATE HEALTH INSURANCE

## 2020-08-27 ENCOUNTER — Other Ambulatory Visit: Payer: Self-pay

## 2020-08-27 ENCOUNTER — Encounter (HOSPITAL_COMMUNITY): Payer: Self-pay

## 2020-08-27 DIAGNOSIS — R29898 Other symptoms and signs involving the musculoskeletal system: Secondary | ICD-10-CM

## 2020-08-27 DIAGNOSIS — M25512 Pain in left shoulder: Secondary | ICD-10-CM

## 2020-08-27 DIAGNOSIS — M25612 Stiffness of left shoulder, not elsewhere classified: Secondary | ICD-10-CM

## 2020-08-27 NOTE — Patient Instructions (Signed)

## 2020-08-28 NOTE — Therapy (Signed)
[place], [place], [address] Phone: [phone]   Fax:  [phone]  Occupational Therapy Treatment  Patient Details  Name: Hannah Cooper MRN: [id number] Date of Birth: October 19, 1955 Referring Provider (OT): Arther Abbott, MD   Encounter Date: 08/27/2020   OT End of Session - 08/28/20 1111    Visit Number 14    Number of Visits 24    Date for OT Re-Evaluation 10/07/20   mini reassess on 09/11/20   Authorization Type 1) [employer] 2) [employer]    Authorization Time Period no copay. no visit limit Fax initial evaluation for medical necessity.    Progress Note Due on Visit 35    OT Start Time [id number]   arrived late   OT Stop Time 1723    OT Time Calculation (min) 32 min    Activity Tolerance Patient tolerated treatment well    Behavior During Therapy WFL for tasks assessed/performed           Past Medical History:  Diagnosis Date  . Arthritis   . Atypical chest pain    chronic  . Cancer ([place])    bone cancer  . Chronic back pain   . Chronic diastolic CHF (congestive heart failure) ([place])   . COPD (chronic obstructive pulmonary disease) ([place])   . DDD (degenerative disc disease), cervical   . History of radiation therapy 07/16/19-08/22/19   L4 spine ; Dr. Gery Pray  . Hyperlipidemia   . Hypertension   . Lumbar radiculopathy   . Meningitis   . Normal coronary arteries 2014  . Palpitations   . Premature atrial contractions   . PVC's (premature ventricular contractions)     Past Surgical History:  Procedure Laterality Date  . ABDOMINAL HYSTERECTOMY    . IR FLUORO GUIDED NEEDLE PLC ASPIRATION/INJECTION LOC  06/19/2019  . LEFT HEART CATHETERIZATION WITH CORONARY ANGIOGRAM N/A 09/05/2012   Procedure: LEFT HEART CATHETERIZATION WITH CORONARY ANGIOGRAM;  Surgeon: Wellington Hampshire, MD;  Location: [facility];  Service: Cardiovascular;  Laterality: N/A;  . RESECTION DISTAL CLAVICAL Left  07/03/2020   Procedure: RESECTION DISTAL CLAVICAL;  Surgeon: Carole Civil, MD;  Location: [facility];  Service: Orthopedics;  Laterality: Left;  . SHOULDER OPEN ROTATOR CUFF REPAIR Left 07/03/2020   Procedure: ROTATOR CUFF REPAIR SHOULDER OPEN WITH CHROMEOPLASTY;  Surgeon: Carole Civil, MD;  Location: [facility];  Service: Orthopedics;  Laterality: Left;  . THYROID SURGERY  2002    There were no vitals filed for this visit.   Subjective Assessment - 08/27/20 1653    Subjective  S: It's ok today.    Currently in Pain? Yes    Pain Score 5     Pain Location Shoulder    Pain Orientation Left    Pain Descriptors / Indicators Aching;Sore    Pain Type Acute pain    Pain Onset In the past 7 days    Pain Frequency Constant    Aggravating Factors  jerking movements    Pain Relieving Factors pain medication, ice    Effect of Pain on Daily Activities max effect    Multiple Pain Sites No              [facility] OT Assessment - 08/28/20 1110      Assessment   Medical Diagnosis Left RTC repair      Precautions   Precautions Shoulder;Other (comment)    Type of Shoulder Precautions Standard RCR protocol  Sling on 4-6 weeks   0-6 weeks (3/18-4/29)-: PROM, pendulum   6-12 weeks (4/29-6/10): AAROM progressing to AROM   12-24 weeks: Strengthening exercises    Shoulder Interventions For comfort    Precaution Comments History of Plasmocytoma.                    OT Treatments/Exercises (OP) - 08/27/20 1703      Exercises   Exercises Shoulder      Shoulder Exercises: Supine   Protraction PROM;5 reps    Horizontal ABduction PROM;5 reps    External Rotation PROM;5 reps    Internal Rotation PROM;5 reps    Flexion PROM;5 reps    ABduction PROM;5 reps      Shoulder Exercises: Standing   Protraction AAROM;10 reps    Horizontal ABduction AAROM;10 reps    External Rotation AAROM;10 reps    Internal Rotation AAROM;10 reps    Flexion AAROM;10 reps    ABduction AAROM;10 reps       Shoulder Exercises: Pulleys   Flexion 1 minute   standing     Shoulder Exercises: Therapy Ball   Flexion --   3 circles both directions     Shoulder Exercises: ROM/Strengthening   Wall Wash 1'      Manual Therapy   Manual Therapy Myofascial release    Manual therapy comments manual therapy completed seperately from all other interventions this date    Myofascial Release myofascial release and manual stretching to left upper arm, trapezius, and scapular regions to decrease pain and fascial restrictions and increase joint ROM                  OT Education - 08/27/20 1715    Education provided Yes    Education Details Provided all AA/ROM shoulder exercises. Encouraged to complete standing.    Person(s) Educated Patient    Methods Explanation;Demonstration;Verbal cues;Handout    Comprehension Verbalized understanding;Returned demonstration            OT Short Term Goals - 07/17/20 1213      OT SHORT TERM GOAL #1   Title Patient will be educated and independent with HEP in order to facilitate her progress in therapy and begin to use her LUE as her non dominant extremity for 50% or more of daily tasks.    Time 6    Period Weeks    Status On-going    Target Date 08/26/20      OT SHORT TERM GOAL #2   Title patient will increase her LUE P/ROM to [facility] in order to increase ability to complete upper body dressing tasks.    Time 6    Period Weeks    Status On-going      OT SHORT TERM GOAL #3   Title Patient will increase her LUE strength to 3/5 in order to complete functional waist level self care tasks with less difficulty.    Time 6    Period Weeks    Status On-going      OT SHORT TERM GOAL #4   Title Patient will report a decrease in pain for her LUE of approximately 6/10 or less while completing dressing and bathing tasks.    Time 6    Period Weeks    Status On-going      OT SHORT TERM GOAL #5   Title Patient will decrease LUE fascial restrictions to moderate  amount in order to increase her functional mobility needed to complete low level reaching  tasks.    Time 6    Period Weeks    Status On-going             OT Long Term Goals - 07/17/20 1213      OT LONG TERM GOAL #1   Title Patient will increase her LUE A/ROM to [facility] in order to complete reaching tasks at or above her shoulder level.    Time 12    Period Weeks    Status On-going      OT LONG TERM GOAL #2   Title Patient will increase her LUE strength to 4/5 or better in order to return to household lifting tasks while using her LUE.    Time 12    Period Weeks    Status On-going      OT LONG TERM GOAL #3   Title Patient will report a decrease in pain level in her LUE to 3/10 or less while completing daily tasks.    Time 12    Period Weeks    Status On-going      OT LONG TERM GOAL #4   Title Patient will decrease her LUE fascial restrictions to min amount or less in order to increase the functional mobility needed to complete reaching tasks.    Time 12    Period Weeks    Status On-going                 Plan - 08/28/20 1111    Clinical Impression Statement A: completed AA/ROM standing with HEP updated. manual techniques completed to address fascial restrictions in the left upper arm and upper trapezius. Patient with min fascial restrictions noted. Continues to be limited with passive ROM due to pain level. Added pulleys, wall wash, and therapy ball circles to further progress functional range of motion. VC for form and technique were provided.    Body Structure / Function / Physical Skills ADL;UE functional use;Fascial restriction;Pain;ROM;Decreased knowledge of precautions;Strength;Mobility    Plan P: Continue with AA/ROM standing. progress P/ROM while increasing tolerance level. Increase therapy ball circles to 5 each direction.    OT Home Exercise Plan eval: A/ROM elbow, wrist, forearm, seated A/ROM scapular exercises; 4/6: pendulums; 4/15: table slides 4/27: AA/ROM er  supine 5/5: AA/ROm supine except abduction 5/10: standing AA/ROM protraction, abduction, flexion 5/12: AA/ROM standing (all)    Consulted and Agree with Plan of Care Patient           Patient will benefit from skilled therapeutic intervention in order to improve the following deficits and impairments:   Body Structure / Function / Physical Skills: ADL,UE functional use,Fascial restriction,Pain,ROM,Decreased knowledge of precautions,Strength,Mobility       Visit Diagnosis: Other symptoms and signs involving the musculoskeletal system  Acute pain of left shoulder  Stiffness of left shoulder, not elsewhere classified    Problem List Patient Active Problem List   Diagnosis Date Noted  . S/p left rotator cuff repair distal clavicle resection 07/03/20  07/07/2020  . Complete tear of left rotator cuff   . 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[place]) 07/10/2019  . Plasma cell disorder 06/10/2019  . Lesion of bone of lumbosacral spine 05/28/2019  . Left-sided weakness 03/13/2016  . Cerebrovascular accident (CVA) ([place])   . Palpitations   . COPD (chronic obstructive pulmonary disease) ([place]) 08/20/2014  . Dyspnea 12/19/2013  . Chronic diastolic CHF (congestive heart failure) ([place]) 07/05/2013  . Lower extremity edema 07/05/2013  . Hyperlipidemia   . Chest pain 07/04/2013  . Hypokalemia  09/04/2012  . Precordial pain 09/04/2012  . Viral meningitis 01/22/2011  . Vomiting 01/22/2011  . PNEUMONIA, LEFT LOWER LOBE 10/10/2006  . DISEASE, ACUTE BRONCHOSPASM 10/10/2006  . Essential hypertension 05/23/2006  . OSTEOARTHRITIS 05/23/2006    Ailene Ravel, OTR/L,CBIS  [phone]  08/28/2020, 11:15 AM  [place], [place], [address] Phone: [phone]   Fax:  [phone]  Name: Hannah Cooper MRN: [id number] Date of Birth: December 09, 1955

## 2020-09-01 ENCOUNTER — Other Ambulatory Visit: Payer: Self-pay

## 2020-09-01 ENCOUNTER — Ambulatory Visit (HOSPITAL_COMMUNITY): Payer: PRIVATE HEALTH INSURANCE | Admitting: Occupational Therapy

## 2020-09-01 ENCOUNTER — Encounter (HOSPITAL_COMMUNITY): Payer: Self-pay | Admitting: Occupational Therapy

## 2020-09-01 DIAGNOSIS — R29898 Other symptoms and signs involving the musculoskeletal system: Secondary | ICD-10-CM | POA: Diagnosis not present

## 2020-09-01 DIAGNOSIS — M25612 Stiffness of left shoulder, not elsewhere classified: Secondary | ICD-10-CM

## 2020-09-01 DIAGNOSIS — M25512 Pain in left shoulder: Secondary | ICD-10-CM

## 2020-09-01 NOTE — Therapy (Signed)
[place], [place], [address] Phone: [phone]   Fax:  [phone]  Occupational Therapy Treatment  Patient Details  Name: Hannah Cooper MRN: [id number] Date of Birth: Oct 27, 1955 Referring Provider (OT): Arther Abbott, MD   Encounter Date: 09/01/2020   OT End of Session - 09/01/20 1737    Visit Number 15    Number of Visits 24    Date for OT Re-Evaluation 10/07/20   mini reassess on 09/11/20   Authorization Type 1) [employer] 2) [employer]    Authorization Time Period no copay. no visit limit Fax initial evaluation for medical necessity.    Progress Note Due on Visit 72    OT Start Time 1655   pt arrived late   OT Stop Time 1730    OT Time Calculation (min) 35 min    Activity Tolerance Patient tolerated treatment well    Behavior During Therapy WFL for tasks assessed/performed           Past Medical History:  Diagnosis Date  . Arthritis   . Atypical chest pain    chronic  . Cancer ([place])    bone cancer  . Chronic back pain   . Chronic diastolic CHF (congestive heart failure) ([place])   . COPD (chronic obstructive pulmonary disease) ([place])   . DDD (degenerative disc disease), cervical   . History of radiation therapy 07/16/19-08/22/19   L4 spine ; Dr. Gery Pray  . Hyperlipidemia   . Hypertension   . Lumbar radiculopathy   . Meningitis   . Normal coronary arteries 2014  . Palpitations   . Premature atrial contractions   . PVC's (premature ventricular contractions)     Past Surgical History:  Procedure Laterality Date  . ABDOMINAL HYSTERECTOMY    . IR FLUORO GUIDED NEEDLE PLC ASPIRATION/INJECTION LOC  06/19/2019  . LEFT HEART CATHETERIZATION WITH CORONARY ANGIOGRAM N/A 09/05/2012   Procedure: LEFT HEART CATHETERIZATION WITH CORONARY ANGIOGRAM;  Surgeon: Wellington Hampshire, MD;  Location: [facility];  Service: Cardiovascular;  Laterality: N/A;  . RESECTION DISTAL CLAVICAL Left  07/03/2020   Procedure: RESECTION DISTAL CLAVICAL;  Surgeon: Carole Civil, MD;  Location: [facility];  Service: Orthopedics;  Laterality: Left;  . SHOULDER OPEN ROTATOR CUFF REPAIR Left 07/03/2020   Procedure: ROTATOR CUFF REPAIR SHOULDER OPEN WITH CHROMEOPLASTY;  Surgeon: Carole Civil, MD;  Location: [facility];  Service: Orthopedics;  Laterality: Left;  . THYROID SURGERY  2002    There were no vitals filed for this visit.   Subjective Assessment - 09/01/20 1656    Subjective  S: I've really been trying to use it.    Currently in Pain? Yes    Pain Score 6     Pain Location Shoulder    Pain Orientation Left    Pain Descriptors / Indicators Aching;Sore    Pain Type Acute pain    Pain Radiating Towards down arm    Pain Onset In the past 7 days    Pain Frequency Constant    Aggravating Factors  increased use    Pain Relieving Factors pain medication, ice    Effect of Pain on Daily Activities mod effect on ADLs              [facility] OT Assessment - 09/01/20 1656      Assessment   Medical Diagnosis Left RTC repair      Precautions   Precautions Shoulder;Other (comment)  Type of Shoulder Precautions Standard RCR protocol   Sling on 4-6 weeks   0-6 weeks (3/18-4/29)-: PROM, pendulum   6-12 weeks (4/29-6/10): AAROM progressing to AROM   12-24 weeks: Strengthening exercises    Shoulder Interventions For comfort                    OT Treatments/Exercises (OP) - 09/01/20 1711      Exercises   Exercises Shoulder      Shoulder Exercises: Supine   Protraction PROM;5 reps    Horizontal ABduction PROM;5 reps    External Rotation PROM;5 reps    Internal Rotation PROM;5 reps    Flexion PROM;5 reps    ABduction PROM;5 reps      Shoulder Exercises: Standing   Protraction AAROM;10 reps    Horizontal ABduction AAROM;10 reps    External Rotation AAROM;10 reps    Internal Rotation AAROM;10 reps    Flexion AAROM;10 reps    ABduction AAROM;10 reps      Shoulder  Exercises: Pulleys   Flexion 2 minutes    ABduction 2 minutes      Shoulder Exercises: ROM/Strengthening   Wall Wash 1'    Other ROM/Strengthening Exercises PVC pipe slide; 10X flexion      Manual Therapy   Manual Therapy Myofascial release    Manual therapy comments manual therapy completed seperately from all other interventions this date    Myofascial Release myofascial release and manual stretching to left upper arm, trapezius, and scapular regions to decrease pain and fascial restrictions and increase joint ROM                    OT Short Term Goals - 07/17/20 1213      OT SHORT TERM GOAL #1   Title Patient will be educated and independent with HEP in order to facilitate her progress in therapy and begin to use her LUE as her non dominant extremity for 50% or more of daily tasks.    Time 6    Period Weeks    Status On-going    Target Date 08/26/20      OT SHORT TERM GOAL #2   Title patient will increase her LUE P/ROM to [facility] in order to increase ability to complete upper body dressing tasks.    Time 6    Period Weeks    Status On-going      OT SHORT TERM GOAL #3   Title Patient will increase her LUE strength to 3/5 in order to complete functional waist level self care tasks with less difficulty.    Time 6    Period Weeks    Status On-going      OT SHORT TERM GOAL #4   Title Patient will report a decrease in pain for her LUE of approximately 6/10 or less while completing dressing and bathing tasks.    Time 6    Period Weeks    Status On-going      OT SHORT TERM GOAL #5   Title Patient will decrease LUE fascial restrictions to moderate amount in order to increase her functional mobility needed to complete low level reaching tasks.    Time 6    Period Weeks    Status On-going             OT Long Term Goals - 07/17/20 1213      OT LONG TERM GOAL #1   Title Patient will increase her LUE A/ROM to [facility]  in order to complete reaching tasks at or above her  shoulder level.    Time 12    Period Weeks    Status On-going      OT LONG TERM GOAL #2   Title Patient will increase her LUE strength to 4/5 or better in order to return to household lifting tasks while using her LUE.    Time 12    Period Weeks    Status On-going      OT LONG TERM GOAL #3   Title Patient will report a decrease in pain level in her LUE to 3/10 or less while completing daily tasks.    Time 12    Period Weeks    Status On-going      OT LONG TERM GOAL #4   Title Patient will decrease her LUE fascial restrictions to min amount or less in order to increase the functional mobility needed to complete reaching tasks.    Time 12    Period Weeks    Status On-going                 Plan - 09/01/20 1737    Clinical Impression Statement A: Continued with myofascial release to address fascial restrictions, passive stretching. Pt with ROM at approximately 75% range today, continued with AA/ROM in standing, pvc pipe slide, and wall wash. Increased pulleys to 2'. Verbal cuing for form and technique.    Body Structure / Function / Physical Skills ADL;UE functional use;Fascial restriction;Pain;ROM;Decreased knowledge of precautions;Strength;Mobility    Plan P: attempt A/ROM supine, continue with AA/ROM standing, Increase therapy ball circles to 5 reps each direction    OT Home Exercise Plan eval: A/ROM elbow, wrist, forearm, seated A/ROM scapular exercises; 4/6: pendulums; 4/15: table slides 4/27: AA/ROM er supine 5/5: AA/ROm supine except abduction 5/10: standing AA/ROM protraction, abduction, flexion 5/12: AA/ROM standing (all)    Consulted and Agree with Plan of Care Patient           Patient will benefit from skilled therapeutic intervention in order to improve the following deficits and impairments:   Body Structure / Function / Physical Skills: ADL,UE functional use,Fascial restriction,Pain,ROM,Decreased knowledge of precautions,Strength,Mobility       Visit  Diagnosis: Other symptoms and signs involving the musculoskeletal system  Acute pain of left shoulder  Stiffness of left shoulder, not elsewhere classified    Problem List Patient Active Problem List   Diagnosis Date Noted  . S/p left rotator cuff repair distal clavicle resection 07/03/20  07/07/2020  . Complete tear of left rotator cuff   . 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[place]) 07/10/2019  . Plasma cell disorder 06/10/2019  . Lesion of bone of lumbosacral spine 05/28/2019  . Left-sided weakness 03/13/2016  . Cerebrovascular accident (CVA) ([place])   . Palpitations   . COPD (chronic obstructive pulmonary disease) ([place]) 08/20/2014  . Dyspnea 12/19/2013  . Chronic diastolic CHF (congestive heart failure) ([place]) 07/05/2013  . Lower extremity edema 07/05/2013  . Hyperlipidemia   . Chest pain 07/04/2013  . Hypokalemia 09/04/2012  . Precordial pain 09/04/2012  . Viral meningitis 01/22/2011  . Vomiting 01/22/2011  . PNEUMONIA, LEFT LOWER LOBE 10/10/2006  . DISEASE, ACUTE BRONCHOSPASM 10/10/2006  . Essential hypertension 05/23/2006  . OSTEOARTHRITIS 05/23/2006   Guadelupe Sabin, OTR/L  [phone] 09/01/2020, 5:40 PM  [place] [address] [place], [place], [address] Phone: [phone]   Fax:  [phone]  Name: LIBERTIE HAUSLER MRN: [id number] Date of  Birth: Oct 18, 1955

## 2020-09-03 ENCOUNTER — Encounter (HOSPITAL_COMMUNITY): Payer: Self-pay

## 2020-09-03 ENCOUNTER — Other Ambulatory Visit: Payer: Self-pay

## 2020-09-03 ENCOUNTER — Ambulatory Visit (HOSPITAL_COMMUNITY): Payer: PRIVATE HEALTH INSURANCE

## 2020-09-03 DIAGNOSIS — M25512 Pain in left shoulder: Secondary | ICD-10-CM

## 2020-09-03 DIAGNOSIS — M25612 Stiffness of left shoulder, not elsewhere classified: Secondary | ICD-10-CM

## 2020-09-03 DIAGNOSIS — R29898 Other symptoms and signs involving the musculoskeletal system: Secondary | ICD-10-CM | POA: Diagnosis not present

## 2020-09-03 NOTE — Therapy (Signed)
[place], [place], [address] Phone: [phone]   Fax:  [phone]  Occupational Therapy Treatment  Patient Details  Name: Hannah Cooper MRN: [id number] Date of Birth: 1955/06/16 Referring Provider (OT): Arther Abbott, MD   Encounter Date: 09/03/2020   OT End of Session - 09/03/20 1728    Visit Number 16    Number of Visits 24    Date for OT Re-Evaluation 10/07/20   mini reassess on 09/11/20   Authorization Type 1) [employer] 2) [employer]    Authorization Time Period no copay. no visit limit Fax initial evaluation for medical necessity.    Progress Note Due on Visit 72    OT Start Time 1655   pt checked in late   OT Stop Time 1724    OT Time Calculation (min) 29 min    Activity Tolerance Patient tolerated treatment well    Behavior During Therapy WFL for tasks assessed/performed           Past Medical History:  Diagnosis Date  . Arthritis   . Atypical chest pain    chronic  . Cancer ([place])    bone cancer  . Chronic back pain   . Chronic diastolic CHF (congestive heart failure) ([place])   . COPD (chronic obstructive pulmonary disease) ([place])   . DDD (degenerative disc disease), cervical   . History of radiation therapy 07/16/19-08/22/19   L4 spine ; Dr. Gery Pray  . Hyperlipidemia   . Hypertension   . Lumbar radiculopathy   . Meningitis   . Normal coronary arteries 2014  . Palpitations   . Premature atrial contractions   . PVC's (premature ventricular contractions)     Past Surgical History:  Procedure Laterality Date  . ABDOMINAL HYSTERECTOMY    . IR FLUORO GUIDED NEEDLE PLC ASPIRATION/INJECTION LOC  06/19/2019  . LEFT HEART CATHETERIZATION WITH CORONARY ANGIOGRAM N/A 09/05/2012   Procedure: LEFT HEART CATHETERIZATION WITH CORONARY ANGIOGRAM;  Surgeon: Wellington Hampshire, MD;  Location: [facility];  Service: Cardiovascular;  Laterality: N/A;  . RESECTION DISTAL CLAVICAL Left  07/03/2020   Procedure: RESECTION DISTAL CLAVICAL;  Surgeon: Carole Civil, MD;  Location: [facility];  Service: Orthopedics;  Laterality: Left;  . SHOULDER OPEN ROTATOR CUFF REPAIR Left 07/03/2020   Procedure: ROTATOR CUFF REPAIR SHOULDER OPEN WITH CHROMEOPLASTY;  Surgeon: Carole Civil, MD;  Location: [facility];  Service: Orthopedics;  Laterality: Left;  . THYROID SURGERY  2002    There were no vitals filed for this visit.   Subjective Assessment - 09/03/20 1658    Currently in Pain? Yes    Pain Score 7     Pain Location Shoulder    Pain Orientation Left    Pain Descriptors / Indicators Aching;Sore    Pain Type Acute pain    Pain Frequency Constant    Aggravating Factors  housework today    Pain Relieving Factors pain medication, ice    Effect of Pain on Daily Activities mod effect              [facility] OT Assessment - 09/03/20 1706      Assessment   Medical Diagnosis Left RTC repair      Precautions   Precautions Shoulder;Other (comment)    Type of Shoulder Precautions Standard RCR protocol   Sling on 4-6 weeks   0-6 weeks (3/18-4/29)-: PROM, pendulum   6-12 weeks (4/29-6/10): AAROM progressing to AROM   12-24  weeks: Strengthening exercises    Precaution Comments History of Plasmocytoma.                    OT Treatments/Exercises (OP) - 09/03/20 1704      Exercises   Exercises Shoulder      Shoulder Exercises: Supine   Protraction PROM;5 reps;AROM;10 reps    Horizontal ABduction PROM;5 reps;AROM;10 reps    External Rotation PROM;5 reps;AROM;10 reps    Internal Rotation PROM;5 reps;AROM;10 reps    Flexion PROM;5 reps;AROM;10 reps    ABduction PROM;5 reps      Shoulder Exercises: Standing   Protraction AROM;10 reps    External Rotation AROM;10 reps    Internal Rotation AROM;10 reps    Flexion AROM;10 reps      Shoulder Exercises: Therapy Ball   Right/Left 5 reps   each direction     Shoulder Exercises: ROM/Strengthening   Wall Wash 1'    Other  ROM/Strengthening Exercises tennis ball pass completed 5X around head and back each direction.                    OT Short Term Goals - 07/17/20 1213      OT SHORT TERM GOAL #1   Title Patient will be educated and independent with HEP in order to facilitate her progress in therapy and begin to use her LUE as her non dominant extremity for 50% or more of daily tasks.    Time 6    Period Weeks    Status On-going    Target Date 08/26/20      OT SHORT TERM GOAL #2   Title patient will increase her LUE P/ROM to [facility] in order to increase ability to complete upper body dressing tasks.    Time 6    Period Weeks    Status On-going      OT SHORT TERM GOAL #3   Title Patient will increase her LUE strength to 3/5 in order to complete functional waist level self care tasks with less difficulty.    Time 6    Period Weeks    Status On-going      OT SHORT TERM GOAL #4   Title Patient will report a decrease in pain for her LUE of approximately 6/10 or less while completing dressing and bathing tasks.    Time 6    Period Weeks    Status On-going      OT SHORT TERM GOAL #5   Title Patient will decrease LUE fascial restrictions to moderate amount in order to increase her functional mobility needed to complete low level reaching tasks.    Time 6    Period Weeks    Status On-going             OT Long Term Goals - 07/17/20 1213      OT LONG TERM GOAL #1   Title Patient will increase her LUE A/ROM to [facility] in order to complete reaching tasks at or above her shoulder level.    Time 12    Period Weeks    Status On-going      OT LONG TERM GOAL #2   Title Patient will increase her LUE strength to 4/5 or better in order to return to household lifting tasks while using her LUE.    Time 12    Period Weeks    Status On-going      OT LONG TERM GOAL #3   Title Patient will  report a decrease in pain level in her LUE to 3/10 or less while completing daily tasks.    Time 12    Period  Weeks    Status On-going      OT LONG TERM GOAL #4   Title Patient will decrease her LUE fascial restrictions to min amount or less in order to increase the functional mobility needed to complete reaching tasks.    Time 12    Period Weeks    Status On-going                 Plan - 09/03/20 1728    Clinical Impression Statement A> No myofascial release completed due to time constraint. patient demonstrated increased P/ROM this date for all shoulder ranges. ABle to complete all A/ROM supine except abduction due to pain. Complete standing A/ROM for flexion, protraction, and IR/er. VC for form and technique were provided. Monitored pain level during new progressive exercises.    Body Structure / Function / Physical Skills ADL;UE functional use;Fascial restriction;Pain;ROM;Decreased knowledge of precautions;Strength;Mobility    Plan P: Mini reassessment. Measure for MD appointment on Thursday 5/26. Continue to progress to A/ROM when able to tolerate with pain leve. Attempt scapular strengthening and UBE bike.    Consulted and Agree with Plan of Care Patient           Patient will benefit from skilled therapeutic intervention in order to improve the following deficits and impairments:   Body Structure / Function / Physical Skills: ADL,UE functional use,Fascial restriction,Pain,ROM,Decreased knowledge of precautions,Strength,Mobility       Visit Diagnosis: Stiffness of left shoulder, not elsewhere classified  Acute pain of left shoulder  Other symptoms and signs involving the musculoskeletal system    Problem List Patient Active Problem List   Diagnosis Date Noted  . S/p left rotator cuff repair distal clavicle resection 07/03/20  07/07/2020  . Complete tear of left rotator cuff   . 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[place]) 07/10/2019  . Plasma cell disorder 06/10/2019  . Lesion of bone of lumbosacral spine 05/28/2019  . Left-sided weakness 03/13/2016  .  Cerebrovascular accident (CVA) ([place])   . Palpitations   . COPD (chronic obstructive pulmonary disease) ([place]) 08/20/2014  . Dyspnea 12/19/2013  . Chronic diastolic CHF (congestive heart failure) ([place]) 07/05/2013  . Lower extremity edema 07/05/2013  . Hyperlipidemia   . Chest pain 07/04/2013  . Hypokalemia 09/04/2012  . Precordial pain 09/04/2012  . Viral meningitis 01/22/2011  . Vomiting 01/22/2011  . PNEUMONIA, LEFT LOWER LOBE 10/10/2006  . DISEASE, ACUTE BRONCHOSPASM 10/10/2006  . Essential hypertension 05/23/2006  . OSTEOARTHRITIS 05/23/2006    Ailene Ravel, OTR/L,CBIS  [phone]  09/03/2020, 5:31 PM  [facility] [address] [place], [place], [address] Phone: [phone]   Fax:  [phone]  Name: Hannah Cooper MRN: [id number] Date of Birth: [birth year]

## 2020-09-08 ENCOUNTER — Encounter (HOSPITAL_COMMUNITY): Payer: No Typology Code available for payment source

## 2020-09-09 ENCOUNTER — Encounter (HOSPITAL_COMMUNITY): Payer: Self-pay | Admitting: Occupational Therapy

## 2020-09-09 ENCOUNTER — Other Ambulatory Visit: Payer: Self-pay

## 2020-09-09 ENCOUNTER — Ambulatory Visit (HOSPITAL_COMMUNITY): Payer: PRIVATE HEALTH INSURANCE | Admitting: Occupational Therapy

## 2020-09-09 DIAGNOSIS — M25512 Pain in left shoulder: Secondary | ICD-10-CM

## 2020-09-09 DIAGNOSIS — R29898 Other symptoms and signs involving the musculoskeletal system: Secondary | ICD-10-CM

## 2020-09-09 DIAGNOSIS — M25612 Stiffness of left shoulder, not elsewhere classified: Secondary | ICD-10-CM

## 2020-09-09 NOTE — Therapy (Signed)
[facility] [address] [place], [place], [address] Phone: [phone]   Fax:  [phone]  Occupational Therapy Treatment (mini-reassessment)  Patient Details  Name: Hannah Cooper MRN: [id number] Date of Birth: April 30, 1955 Referring Provider (OT): Arther Abbott, MD  Progress Note Reporting Period 08/18/2020 to 09/09/2020  See note below for Objective Data and Assessment of Progress/Goals.     [facility] OT Assessment - 09/09/20 0733      Assessment   Medical Diagnosis Left RTC repair      Precautions   Precautions Shoulder;Other (comment)    Type of Shoulder Precautions Standard RCR protocol   Sling on 4-6 weeks   0-6 weeks (3/18-4/29)-: PROM, pendulum   6-12 weeks (4/29-6/10): AAROM progressing to AROM   12-24 weeks: Strengthening exercises    Precaution Comments History of Plasmocytoma.      ROM / Strength   AROM / PROM / Strength AROM;PROM;Strength      Palpation   Palpation comment min/mod fascial restrictions in left upper arm, trapezius, and scapularis region.      AROM   AROM Assessment Site Shoulder    Right/Left Shoulder Left    Left Shoulder Flexion 124 Degrees   not previously assessed   Left Shoulder ABduction 135 Degrees   not previously assessed   Left Shoulder Internal Rotation 90 Degrees   not previously assessed   Left Shoulder External Rotation 50 Degrees   not previously assessed     PROM   Overall PROM Comments Assessed supine. IR/er adducted.    PROM Assessment Site Shoulder    Right/Left Shoulder Left    Left Shoulder Flexion 131 Degrees   80 previous   Left Shoulder ABduction 144 Degrees   80 previous   Left Shoulder Internal Rotation 90 Degrees   same as previous   Left Shoulder External Rotation 60 Degrees   40 previous     Strength   Strength Assessment Site Shoulder    Right/Left Shoulder Left    Left Shoulder Flexion 4-/5   not previously assessed   Left Shoulder ABduction 4-/5   not  previously assessed   Left Shoulder Internal Rotation 5/5   not previously assessed   Left Shoulder External Rotation 4/5   not previously assessed               Encounter Date: 09/09/2020   OT End of Session - 09/09/20 0813    Visit Number 17    Number of Visits 24    Date for OT Re-Evaluation 10/07/20    Authorization Type 1) [employer] 2) [employer]    Authorization Time Period no copay. no visit limit Fax initial evaluation for medical necessity.    Progress Note Due on Visit 27    OT Start Time 0732    OT Stop Time [id number]    OT Time Calculation (min) 40 min    Activity Tolerance Patient tolerated treatment well    Behavior During Therapy [facility] for tasks assessed/performed           Past Medical History:  Diagnosis Date  . Arthritis   . Atypical chest pain    chronic  . Cancer ([place])    bone cancer  . Chronic back pain   . Chronic diastolic CHF (congestive heart failure) ([place])   . COPD (chronic obstructive pulmonary disease) ([place])   . DDD (degenerative disc disease), cervical   . History of radiation therapy 07/16/19-08/22/19   L4 spine ;  Dr. Gery Pray  . Hyperlipidemia   . Hypertension   . Lumbar radiculopathy   . Meningitis   . Normal coronary arteries 2014  . Palpitations   . Premature atrial contractions   . PVC's (premature ventricular contractions)     Past Surgical History:  Procedure Laterality Date  . ABDOMINAL HYSTERECTOMY    . IR FLUORO GUIDED NEEDLE PLC ASPIRATION/INJECTION LOC  06/19/2019  . LEFT HEART CATHETERIZATION WITH CORONARY ANGIOGRAM N/A 09/05/2012   Procedure: LEFT HEART CATHETERIZATION WITH CORONARY ANGIOGRAM;  Surgeon: Wellington Hampshire, MD;  Location: [facility];  Service: Cardiovascular;  Laterality: N/A;  . RESECTION DISTAL CLAVICAL Left 07/03/2020   Procedure: RESECTION DISTAL CLAVICAL;  Surgeon: Carole Civil, MD;  Location: [facility];  Service: Orthopedics;  Laterality: Left;  . SHOULDER OPEN ROTATOR CUFF  REPAIR Left 07/03/2020   Procedure: ROTATOR CUFF REPAIR SHOULDER OPEN WITH CHROMEOPLASTY;  Surgeon: Carole Civil, MD;  Location: [facility];  Service: Orthopedics;  Laterality: Left;  . THYROID SURGERY  2002    There were no vitals filed for this visit.   Subjective Assessment - 09/09/20 0733    Subjective  S: Sometimes I feel pins and needles sensation.    Currently in Pain? Yes    Pain Score 5     Pain Location Shoulder    Pain Orientation Left    Pain Descriptors / Indicators Aching;Sore    Pain Type Acute pain    Pain Radiating Towards down arm    Pain Onset In the past 7 days    Pain Frequency Constant    Aggravating Factors  positioning    Pain Relieving Factors pain medication, ice    Effect of Pain on Daily Activities mod effect    Multiple Pain Sites No                    OT Treatments/Exercises (OP) - 09/09/20 0735      Exercises   Exercises Shoulder      Shoulder Exercises: Supine   Protraction PROM;5 reps;AROM;10 reps    Horizontal ABduction PROM;5 reps;AROM;10 reps    External Rotation PROM;5 reps;AROM;10 reps    Internal Rotation PROM;5 reps;AROM;10 reps    Flexion PROM;5 reps;AROM;10 reps    ABduction PROM;5 reps;AROM;10 reps      Shoulder Exercises: Standing   Protraction AROM;10 reps    Horizontal ABduction AROM;10 reps    External Rotation AROM;10 reps    Internal Rotation AROM;10 reps    Flexion AROM;10 reps    ABduction AROM;10 reps      Shoulder Exercises: ROM/Strengthening   Proximal Shoulder Strengthening, Supine 10X each, no rest breaks      Manual Therapy   Manual Therapy Myofascial release    Manual therapy comments manual therapy completed seperately from all other interventions this date    Myofascial Release myofascial release and manual stretching to left upper arm, trapezius, and scapular regions to decrease pain and fascial restrictions and increase joint ROM                  OT Education - 09/09/20 0807     Education provided Yes    Education Details A/ROM shoulder    Person(s) Educated Patient    Methods Explanation;Demonstration;Verbal cues;Handout    Comprehension Verbalized understanding;Returned demonstration            OT Short Term Goals - 09/09/20 0805      OT SHORT TERM GOAL #1  Title Patient will be educated and independent with HEP in order to facilitate her progress in therapy and begin to use her LUE as her non dominant extremity for 50% or more of daily tasks.    Time 6    Period Weeks    Status On-going    Target Date 08/26/20      OT SHORT TERM GOAL #2   Title patient will increase her LUE P/ROM to [facility] in order to increase ability to complete upper body dressing tasks.    Time 6    Period Weeks    Status Achieved      OT SHORT TERM GOAL #3   Title Patient will increase her LUE strength to 3/5 in order to complete functional waist level self care tasks with less difficulty.    Time 6    Period Weeks    Status Achieved      OT SHORT TERM GOAL #4   Title Patient will report a decrease in pain for her LUE of approximately 6/10 or less while completing dressing and bathing tasks.    Time 6    Period Weeks    Status Achieved      OT SHORT TERM GOAL #5   Title Patient will decrease LUE fascial restrictions to moderate amount in order to increase her functional mobility needed to complete low level reaching tasks.    Time 6    Period Weeks    Status Achieved             OT Long Term Goals - 09/09/20 6789      OT LONG TERM GOAL #1   Title Patient will increase her LUE A/ROM to [facility] in order to complete reaching tasks at or above her shoulder level.    Time 12    Period Weeks    Status On-going      OT LONG TERM GOAL #2   Title Patient will increase her LUE strength to 4/5 or better in order to return to household lifting tasks while using her LUE.    Time 12    Period Weeks    Status On-going      OT LONG TERM GOAL #3   Title Patient will report a  decrease in pain level in her LUE to 3/10 or less while completing daily tasks.    Time 12    Period Weeks    Status On-going      OT LONG TERM GOAL #4   Title Patient will decrease her LUE fascial restrictions to min amount or less in order to increase the functional mobility needed to complete reaching tasks.    Time 12    Period Weeks    Status On-going                 Plan - 09/09/20 0813    Clinical Impression Statement A: Mini-reassessment and progress note completed this session. Pt is making good progress during therapy sessions and has met 4/5 STGs and is progressing towards LTGs. Pt reports she is using her arm during ADLs and has minimal pain during basic ADLs, does experience fatigue and decreased strength for more strenuous tasks. Pt reports occasional tingling/pins/needles sensations and has had one occurance of LUE numbness. Discussed signs of inflammation and nerve compression, educated on positioning and positional changes with the onset of these symptoms. Continued with A/ROM during session, completing all tasks supine and standing. Updated HEP for A/ROM today. Verbal cuing for form and  technique.    Body Structure / Function / Physical Skills ADL;UE functional use;Fascial restriction;Pain;ROM;Decreased knowledge of precautions;Strength;Mobility    Plan P: Follow up on MD appt. Attempt scapular strengthening and UBE    OT Home Exercise Plan eval: A/ROM elbow, wrist, forearm, seated A/ROM scapular exercises; 4/6: pendulums; 4/15: table slides 4/27: AA/ROM er supine 5/5: AA/ROm supine except abduction 5/10: standing AA/ROM protraction, abduction, flexion 5/12: AA/ROM standing (all); 5/25: A/ROM    Consulted and Agree with Plan of Care Patient           Patient will benefit from skilled therapeutic intervention in order to improve the following deficits and impairments:   Body Structure / Function / Physical Skills: ADL,UE functional use,Fascial  restriction,Pain,ROM,Decreased knowledge of precautions,Strength,Mobility       Visit Diagnosis: Stiffness of left shoulder, not elsewhere classified  Acute pain of left shoulder  Other symptoms and signs involving the musculoskeletal system    Problem List Patient Active Problem List   Diagnosis Date Noted  . S/p left rotator cuff repair distal clavicle resection 07/03/20  07/07/2020  . Complete tear of left rotator cuff   . 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 Plasma cell disorder 06/10/2019  . Lesion of bone of lumbosacral spine 05/28/2019  . Left-sided weakness 03/13/2016  . Cerebrovascular accident (CVA) ([place])   . Palpitations   . COPD (chronic obstructive pulmonary disease) ([place]) 08/20/2014  . Dyspnea 12/19/2013  . Chronic diastolic CHF (congestive heart failure) ([place]) 07/05/2013  . Lower extremity edema 07/05/2013  . Hyperlipidemia   . Chest pain 07/04/2013  . Hypokalemia 09/04/2012  . Precordial pain 09/04/2012  . Viral meningitis 01/22/2011  . Vomiting 01/22/2011  . PNEUMONIA, LEFT LOWER LOBE 10/10/2006  . DISEASE, ACUTE BRONCHOSPASM 10/10/2006  . Essential hypertension 05/23/2006  . OSTEOARTHRITIS 05/23/2006   Guadelupe Sabin, OTR/L  [phone] 09/09/2020, 8:16 AM  [facility] [address] [place], [place], [address] Phone: [phone]   Fax:  [phone]  Name: Hannah Cooper MRN: [id number] Date of Birth: 10/26/1955

## 2020-09-09 NOTE — Patient Instructions (Signed)

## 2020-09-10 ENCOUNTER — Encounter: Payer: Self-pay | Admitting: Orthopedic Surgery

## 2020-09-10 ENCOUNTER — Ambulatory Visit (INDEPENDENT_AMBULATORY_CARE_PROVIDER_SITE_OTHER): Payer: HMO | Admitting: Orthopedic Surgery

## 2020-09-10 ENCOUNTER — Ambulatory Visit (HOSPITAL_COMMUNITY): Payer: PRIVATE HEALTH INSURANCE

## 2020-09-10 DIAGNOSIS — Z9889 Other specified postprocedural states: Secondary | ICD-10-CM

## 2020-09-10 DIAGNOSIS — G8918 Other acute postprocedural pain: Secondary | ICD-10-CM

## 2020-09-10 MED ORDER — TIZANIDINE HCL 4 MG PO TABS: 4.0000 mg | ORAL_TABLET | Freq: Every day | ORAL | 1 refills | Status: DC

## 2020-09-10 NOTE — Progress Notes (Signed)
  Postop visit  Encounter Diagnoses  Name Primary?  . S/p left rotator cuff repair distal clavicle resection 07/03/20  Yes  . Post-op pain     The patient is doing well.  She has regained excellent motion and is getting good strength in her left shoulder.  She is off all opioid pain medication but would like some muscle relaxers  PT notes are noted below  Refill on tizanidine  Continue rotator cuff program  Follow-up in 6 weeks  Meds ordered this encounter  Medications  . tiZANidine (ZANAFLEX) 4 MG tablet    Sig: Take 1 tablet (4 mg total) by mouth daily.    Dispense:  30 tablet    Refill:  1     AROM   AROM Assessment Site Shoulder    Right/Left Shoulder Left    Left Shoulder Flexion 124 Degrees   not previously assessed   Left Shoulder ABduction 135 Degrees   not previously assessed   Left Shoulder Internal Rotation 90 Degrees   not previously assessed   Left Shoulder External Rotation 50 Degrees   not previously assessed     PROM   Overall PROM Comments Assessed supine. IR/er adducted.    PROM Assessment Site Shoulder    Right/Left Shoulder Left    Left Shoulder Flexion 131 Degrees   80 previous   Left Shoulder ABduction 144 Degrees   80 previous   Left Shoulder Internal Rotation 90 Degrees   same as previous   Left Shoulder External Rotation 60 Degrees   40 previous     Strength   Strength Assessment Site Shoulder    Right/Left Shoulder Left    Left Shoulder Flexion 4-/5   not previously assessed   Left Shoulder ABduction 4-/5   not previously assessed   Left Shoulder Internal Rotation 5/5   not previously assessed   Left Shoulder External Rotation 4/5   not previously assessed

## 2020-09-11 ENCOUNTER — Telehealth (HOSPITAL_COMMUNITY): Payer: Self-pay | Admitting: *Deleted

## 2020-09-11 NOTE — Telephone Encounter (Signed)
Patient states that her back pain has gotten worse where it is a 10/10 at times.  She is concerned that her cancer has progressed.  Pain is now located mid lower back. Would like to follow up and possibly get something to control the pain in the meantime.

## 2020-09-12 ENCOUNTER — Other Ambulatory Visit: Payer: Self-pay

## 2020-09-12 ENCOUNTER — Emergency Department (HOSPITAL_COMMUNITY)
Admission: EM | Admit: 2020-09-12 | Discharge: 2020-09-12 | Disposition: A | Discharge status: Home / Self Care | Payer: HMO | Attending: Emergency Medicine | Admitting: Emergency Medicine

## 2020-09-12 ENCOUNTER — Encounter (HOSPITAL_COMMUNITY): Payer: Self-pay | Admitting: Emergency Medicine

## 2020-09-12 ENCOUNTER — Emergency Department (HOSPITAL_COMMUNITY): Payer: HMO

## 2020-09-12 DIAGNOSIS — Z85830 Personal history of malignant neoplasm of bone: Secondary | ICD-10-CM | POA: Diagnosis not present

## 2020-09-12 DIAGNOSIS — J449 Chronic obstructive pulmonary disease, unspecified: Secondary | ICD-10-CM | POA: Diagnosis not present

## 2020-09-12 DIAGNOSIS — M549 Dorsalgia, unspecified: Secondary | ICD-10-CM

## 2020-09-12 DIAGNOSIS — K760 Fatty (change of) liver, not elsewhere classified: Secondary | ICD-10-CM | POA: Diagnosis not present

## 2020-09-12 DIAGNOSIS — I110 Hypertensive heart disease with heart failure: Secondary | ICD-10-CM | POA: Insufficient documentation

## 2020-09-12 DIAGNOSIS — I10 Essential (primary) hypertension: Secondary | ICD-10-CM | POA: Diagnosis not present

## 2020-09-12 DIAGNOSIS — G8929 Other chronic pain: Secondary | ICD-10-CM | POA: Insufficient documentation

## 2020-09-12 DIAGNOSIS — I5032 Chronic diastolic (congestive) heart failure: Secondary | ICD-10-CM | POA: Insufficient documentation

## 2020-09-12 DIAGNOSIS — M546 Pain in thoracic spine: Secondary | ICD-10-CM | POA: Diagnosis not present

## 2020-09-12 DIAGNOSIS — M545 Low back pain, unspecified: Secondary | ICD-10-CM | POA: Diagnosis not present

## 2020-09-12 LAB — CBC WITH DIFFERENTIAL/PLATELET
Abs Immature Granulocytes: 0.02 10*3/uL (ref 0.00–0.07)
Basophils Absolute: 0 10*3/uL (ref 0.0–0.1)
Basophils Relative: 1 %
Eosinophils Absolute: 0.2 10*3/uL (ref 0.0–0.5)
Eosinophils Relative: 6 %
HCT: 35.4 % — ABNORMAL LOW (ref 36.0–46.0)
Hemoglobin: 11.7 g/dL — ABNORMAL LOW (ref 12.0–15.0)
Immature Granulocytes: 1 %
Lymphocytes Relative: 23 %
Lymphs Abs: 0.9 10*3/uL (ref 0.7–4.0)
MCH: 30.5 pg (ref 26.0–34.0)
MCHC: 33.1 g/dL (ref 30.0–36.0)
MCV: 92.4 fL (ref 80.0–100.0)
Monocytes Absolute: 0.4 10*3/uL (ref 0.1–1.0)
Monocytes Relative: 10 %
Neutro Abs: 2.3 10*3/uL (ref 1.7–7.7)
Neutrophils Relative %: 59 %
Platelets: 318 10*3/uL (ref 150–400)
RBC: 3.83 MIL/uL — ABNORMAL LOW (ref 3.87–5.11)
RDW: 13.2 % (ref 11.5–15.5)
WBC: 3.8 10*3/uL — ABNORMAL LOW (ref 4.0–10.5)
nRBC: 0 % (ref 0.0–0.2)

## 2020-09-12 LAB — COMPREHENSIVE METABOLIC PANEL
ALT: 24 U/L (ref 0–44)
AST: 19 U/L (ref 15–41)
Albumin: 4.1 g/dL (ref 3.5–5.0)
Alkaline Phosphatase: 68 U/L (ref 38–126)
Anion gap: 7 (ref 5–15)
BUN: 19 mg/dL (ref 8–23)
CO2: 32 mmol/L (ref 22–32)
Calcium: 9.2 mg/dL (ref 8.9–10.3)
Chloride: 96 mmol/L — ABNORMAL LOW (ref 98–111)
Creatinine, Ser: 0.86 mg/dL (ref 0.44–1.00)
GFR, Estimated: >60 mL/min (ref >60)
Glucose, Bld: 145 mg/dL — ABNORMAL HIGH (ref 70–99)
Potassium: 2.8 mmol/L — ABNORMAL LOW (ref 3.5–5.1)
Sodium: 135 mmol/L (ref 135–145)
Total Bilirubin: 0.6 mg/dL (ref 0.3–1.2)
Total Protein: 8.1 g/dL (ref 6.5–8.1)

## 2020-09-12 LAB — TROPONIN I (HIGH SENSITIVITY)
Troponin I (High Sensitivity): 4 ng/L (ref <18)
Troponin I (High Sensitivity): 3 ng/L (ref <18)

## 2020-09-12 MED ORDER — ONDANSETRON HCL 4 MG/2ML IJ SOLN
4.0000 mg | Freq: Once | INTRAMUSCULAR | Status: AC
  Administered 2020-09-12: 4 mg via INTRAVENOUS
  Filled 2020-09-12: qty 2

## 2020-09-12 MED ORDER — POTASSIUM CHLORIDE CRYS ER 20 MEQ PO TBCR
40.0000 meq | EXTENDED_RELEASE_TABLET | Freq: Once | ORAL | Status: AC
  Administered 2020-09-12: 40 meq via ORAL
  Filled 2020-09-12: qty 2

## 2020-09-12 MED ORDER — HYDROMORPHONE HCL 1 MG/ML IJ SOLN
1.0000 mg | Freq: Once | INTRAMUSCULAR | Status: AC
Start: 2020-09-12 — End: 2020-09-12
  Administered 2020-09-12: 1 mg via INTRAVENOUS
  Filled 2020-09-12: qty 1

## 2020-09-12 MED ORDER — IOHEXOL 350 MG/ML SOLN
100.0000 mL | Freq: Once | INTRAVENOUS | Status: AC | PRN
  Administered 2020-09-12: 100 mL via INTRAVENOUS

## 2020-09-12 MED ORDER — POTASSIUM CHLORIDE CRYS ER 20 MEQ PO TBCR: 20.0000 meq | EXTENDED_RELEASE_TABLET | Freq: Two times a day (BID) | ORAL | 0 refills | Status: DC

## 2020-09-12 MED ORDER — ONDANSETRON HCL 4 MG PO TABS: 4.0000 mg | ORAL_TABLET | Freq: Three times a day (TID) | ORAL | 0 refills | Status: DC | PRN

## 2020-09-12 MED ORDER — HYDROMORPHONE HCL 1 MG/ML IJ SOLN
1.0000 mg | Freq: Once | INTRAMUSCULAR | Status: DC
Start: 2020-09-12 — End: 2020-09-12

## 2020-09-12 MED ORDER — HYDROMORPHONE HCL 1 MG/ML IJ SOLN: 1.0000 mg | Freq: Once | INTRAMUSCULAR | Status: DC

## 2020-09-12 NOTE — Progress Notes (Signed)
Hannah Cooper, [place] [address]   CLINIC:  Medical Oncology/Hematology  PCP:  Hannah Cooper [address] / [place] [address] [phone]   REASON FOR VISIT:  Follow-up for L4 plasmacytoma  PRIOR THERAPY: XRT in 27 fractions from 07/17/2019 to 08/22/2019.  NGS Results: not done  CURRENT THERAPY: surveillance  BRIEF ONCOLOGIC HISTORY:  Oncology History   No history exists.    CANCER STAGING: Cancer Staging No matching staging information was found for the patient.  INTERVAL HISTORY:  Hannah Cooper, a 65 y.o. female, returns for routine follow-up of her L4 plasmacytoma. Hannah Cooper was last seen on 12/05/2019.   Today she reports feeling okay. She reports new and worsened back pain. She reported to the ED for this pain on 09/12/2020 for this back pain. She was given dilaudid at the ED which caused severe nausea which was then treated with zofran.This pain began about 1 week ago. The pain is worsened with inhalation and exhalation as well as movement. When the pain started she was seated and doing no physical activity, and she had no trauma recent around the start of the back pain. The pain was severe, constant, and limited her ROM. The new back pain is near the line of her bra band and is about 6/10, and her chronic lower back pain is at baseline. She is taking zanaflex for her left rotator cuff surgery, but this has not relieved any pain in her back. She takes Lasix prn, and she reports that she has not taken it in over 1 year.  REVIEW OF SYSTEMS:  Review of Systems  Constitutional: Positive for appetite change (50%) and fatigue (50%).  Musculoskeletal: Positive for back pain (6/10).  All other systems reviewed and are negative.   PAST MEDICAL/SURGICAL HISTORY:  Past Medical History:  Diagnosis Date  . Arthritis   . Atypical chest pain    chronic  . Cancer ([facility])    bone cancer  . Chronic back pain   .  Chronic diastolic CHF (congestive heart failure) ([facility])   . COPD (chronic obstructive pulmonary disease) ([facility])   . DDD (degenerative disc disease), cervical   . History of radiation therapy 07/16/19-08/22/19   L4 spine ; Dr. Gery Pray  . Hyperlipidemia   . Hypertension   . Lumbar radiculopathy   . Meningitis   . Normal coronary arteries 2014  . Palpitations   . Premature atrial contractions   . PVC's (premature ventricular contractions)    Past Surgical History:  Procedure Laterality Date  . ABDOMINAL HYSTERECTOMY    . IR FLUORO GUIDED NEEDLE PLC ASPIRATION/INJECTION LOC  06/19/2019  . LEFT HEART CATHETERIZATION WITH CORONARY ANGIOGRAM N/A 09/05/2012   Procedure: LEFT HEART CATHETERIZATION WITH CORONARY ANGIOGRAM;  Surgeon: Wellington Hampshire, MD;  Location: [facility];  Service: Cardiovascular;  Laterality: N/A;  . RESECTION DISTAL CLAVICAL Left 07/03/2020   Procedure: RESECTION DISTAL CLAVICAL;  Surgeon: Carole Civil, MD;  Location: [facility];  Service: Orthopedics;  Laterality: Left;  . SHOULDER OPEN ROTATOR CUFF REPAIR Left 07/03/2020   Procedure: ROTATOR CUFF REPAIR SHOULDER OPEN WITH CHROMEOPLASTY;  Surgeon: Carole Civil, MD;  Location: [facility];  Service: Orthopedics;  Laterality: Left;  . THYROID SURGERY  2002    SOCIAL HISTORY:  Social History   Socioeconomic History  . Marital status: Married    Spouse name: Not on file  . Number of children: Not on file  . Years  of education: Not on file  . Highest education level: Not on file  Occupational History  . Not on file  Tobacco Use  . Smoking status: Never Smoker  . Smokeless tobacco: Never Used  Vaping Use  . Vaping Use: Never used  Substance and Sexual Activity  . Alcohol use: No    Alcohol/week: 0.0 standard drinks  . Drug use: No  . Sexual activity: Yes    Partners: Male  Other Topics Concern  . Not on file  Social History Narrative  . Not on file   Social Determinants of Health   Financial  Resource Strain: Not on file  Food Insecurity: Not on file  Transportation Needs: Not on file  Physical Activity: Not on file  Stress: Not on file  Social Connections: Not on file  Intimate Partner Violence: Not on file    FAMILY HISTORY:  Family History  Problem Relation Age of Onset  . Heart attack Mother 11  . Heart attack Sister 44    CURRENT MEDICATIONS:  Current Outpatient Medications  Medication Sig Dispense Refill  . albuterol (PROVENTIL) (2.5 MG/3ML) 0.083% nebulizer solution Take 2.5 mg by nebulization every 6 (six) hours as needed for wheezing or shortness of breath.     Marland Kitchen albuterol (VENTOLIN HFA) 108 (90 Base) MCG/ACT inhaler Inhale 1-2 puffs into the lungs every 4 (four) hours as needed for wheezing or shortness of breath. 18 g 0  . Apoaequorin (PREVAGEN) 10 MG CAPS Take 10 mg by mouth daily.    . chlorthalidone (HYGROTON) 50 MG tablet Take 1 tablet (50 mg total) by mouth daily. 90 tablet 3  . Cholecalciferol (DIALYVITE VITAMIN D 5000) 125 MCG (5000 UT) capsule Take 5,000 Units by mouth daily.    . cycloSPORINE (RESTASIS) 0.05 % ophthalmic emulsion Place 1 drop into both eyes 2 (two) times daily.    Marland Kitchen diltiazem (CARDIZEM) 30 MG tablet Take 1 tablet by mouth twice daily 180 tablet 3  . furosemide (LASIX) 20 MG tablet Take 1 tablet daily as needed may take 2 tablets prn (Patient taking differently: Take 20-40 mg by mouth daily as needed for edema.) 60 tablet 1  . glipiZIDE-metformin (METAGLIP) 5-500 MG tablet Take 1 tablet by mouth in the morning and at bedtime.    Marland Kitchen ibuprofen (ADVIL) 800 MG tablet Take 1 tablet (800 mg total) by mouth every 8 (eight) hours as needed. 90 tablet 1  . Insulin Glargine (BASAGLAR KWIKPEN) 100 UNIT/ML Inject 15 Units into the skin at bedtime as needed (blood sugar over 150).    . isosorbide mononitrate (IMDUR) 30 MG 24 hr tablet TAKE 1 TABLET (30 MG TOTAL) BY MOUTH DAILY. (Patient taking differently: Take 30 mg by mouth daily.) 90 tablet 0  .  lidocaine (LIDODERM) 5 % Place 2 patches onto the skin daily. Remove & Discard patch within 12 hours or as directed by MD 60 patch 1  . losartan (COZAAR) 100 MG tablet Take 100 mg by mouth daily.    . metFORMIN (GLUCOPHAGE) 500 MG tablet Take 500 mg by mouth 2 (two) times daily.    . metoprolol tartrate (LOPRESSOR) 100 MG tablet Take 100 mg by mouth 2 (two) times daily.    . Multiple Vitamin (MULTIVITAMIN WITH MINERALS) TABS tablet Take 1 tablet by mouth daily.    . mupirocin ointment (BACTROBAN) 2 % Apply 1 application topically 3 (three) times daily as needed (wound care).    . ondansetron (ZOFRAN) 4 MG tablet Take 1 tablet (4  mg total) by mouth every 8 (eight) hours as needed for nausea or vomiting. 12 tablet 0  . potassium chloride SA (KLOR-CON) 20 MEQ tablet Take 1 tablet (20 mEq total) by mouth 2 (two) times daily. 6 tablet 0  . RELION PEN NEEDLES 31G X 6 MM MISC USE 1 PEN NEEDLE TO INJECT INSULIN AT BEDTIME    . tiZANidine (ZANAFLEX) 4 MG tablet Take 1 tablet (4 mg total) by mouth daily. 30 tablet 1  . TURMERIC PO Take 1,000 mg by mouth daily.     No current facility-administered medications for this visit.    ALLERGIES:  Allergies  Allergen Reactions  . Other Swelling    [place] lipstick    PHYSICAL EXAM:  Performance status (ECOG): 1 - Symptomatic but completely ambulatory  There were no vitals filed for this visit. Wt Readings from Last 3 Encounters:  09/12/20 220 lb (99.8 kg)  07/30/20 222 lb 3.2 oz (100.8 kg)  07/02/20 225 lb (102.1 kg)   Physical Exam Vitals reviewed.  Constitutional:      Appearance: Normal appearance.  Cardiovascular:     Rate and Rhythm: Normal rate and regular rhythm.     Pulses: Normal pulses.     Heart sounds: Normal heart sounds.  Pulmonary:     Effort: Pulmonary effort is normal.     Breath sounds: Normal breath sounds.  Musculoskeletal:     Thoracic back: Tenderness present.  Neurological:     General: No focal deficit present.      Mental Status: She is alert and oriented to person, place, and time.  Psychiatric:        Mood and Affect: Mood normal.        Behavior: Behavior normal.      LABORATORY DATA:  I have reviewed the labs as listed.  CBC Latest Ref Rng & Units 09/12/2020 07/02/2020 05/01/2020  WBC 4.0 - 10.5 K/uL 3.8(L) 3.2(L) 3.0(L)  Hemoglobin 12.0 - 15.0 g/dL 11.7(L) 11.6(L) 12.4  Hematocrit 36.0 - 46.0 % 35.4(L) 34.8(L) 37.4  Platelets 150 - 400 K/uL 318 284 246   CMP Latest Ref Rng & Units 09/12/2020 07/02/2020 05/01/2020  Glucose 70 - 99 mg/dL 145(H) 142(H) 159(H)  BUN 8 - 23 mg/dL 19 19 19   Creatinine 0.44 - 1.00 mg/dL 0.86 0.86 1.00  Sodium 135 - 145 mmol/L 135 138 136  Potassium 3.5 - 5.1 mmol/L 2.8(L) 3.3(L) 3.5  Chloride 98 - 111 mmol/L 96(L) 101 98  CO2 22 - 32 mmol/L 32 26 27  Calcium 8.9 - 10.3 mg/dL 9.2 9.6 9.3  Total Protein 6.5 - 8.1 g/dL 8.1 - 8.5(H)  Total Bilirubin 0.3 - 1.2 mg/dL 0.6 - 0.7  Alkaline Phos 38 - 126 U/L 68 - 65  AST 15 - 41 U/L 19 - 23  ALT 0 - 44 U/L 24 - 29    DIAGNOSTIC IMAGING:  I have independently reviewed the scans and discussed with the patient. CT T-SPINE NO CHARGE  Result Date: 09/12/2020 CLINICAL DATA:  Mid back pain that started 1 week ago. History of cancer (plasmacytoma at L4) in lower spine. EXAM: CT Thoracic and Lumbar spine with contrast TECHNIQUE: Multiplanar CT images of the thoracic and lumbar spine were reconstructed from contemporary CT of the Chest, Abdomen, and Pelvis CONTRAST:  None additional COMPARISON:  Lumbar MRI 12/03/2019 FINDINGS: CT THORACIC SPINE FINDINGS Alignment: Exaggerated kyphosis.  No listhesis Vertebrae: Negative for fracture, erosion, or bone lesion. Paraspinal and other soft tissues: Reported separately. Right  thyroid nodule measuring 2.8 cm with atrophy of the remaining gland. Dimensions are similar to a thyroid ultrasound in 2016. There is history of partial thyroidectomy. No follow-up recommended unless clinically warranted  (ref: J Am Coll Radiol. 2015 Feb;12(2): 143-50). Disc levels: Generalized spondylitic spurring greatest at the midthoracic levels. Negative facets. CT LUMBAR SPINE FINDINGS Segmentation: 5 lumbar type vertebrae Alignment: Normal Vertebrae: L4 mixed sclerotic and lucent lesion without acute fracture or evidence of soft tissue tumor extension. Dimensions are similar to a lumbar MR comparison. Paraspinal and other soft tissues: Negative Disc levels: Degenerative facet spurring especially at L3-4 and below. Lower lumbar disc bulging with L5-S1 endplate degeneration that has progressed from 2021 lumbar MRI. IMPRESSION: 1. No acute finding. 2. Unremarkable appearance of treated plasmacytoma at L4. 3. Lower lumbar facet osteoarthritis and focal advanced L5-S1 disc degeneration. Degenerative changes are progressed from 2021. Electronically Signed   By: Monte Fantasia M.D.   On: 09/12/2020 07:09   CT L-SPINE NO CHARGE  Result Date: 09/12/2020 CLINICAL DATA:  Mid back pain that started 1 week ago. History of cancer (plasmacytoma at L4) in lower spine. EXAM: CT Thoracic and Lumbar spine with contrast TECHNIQUE: Multiplanar CT images of the thoracic and lumbar spine were reconstructed from contemporary CT of the Chest, Abdomen, and Pelvis CONTRAST:  None additional COMPARISON:  Lumbar MRI 12/03/2019 FINDINGS: CT THORACIC SPINE FINDINGS Alignment: Exaggerated kyphosis.  No listhesis Vertebrae: Negative for fracture, erosion, or bone lesion. Paraspinal and other soft tissues: Reported separately. Right thyroid nodule measuring 2.8 cm with atrophy of the remaining gland. Dimensions are similar to a thyroid ultrasound in 2016. There is history of partial thyroidectomy. No follow-up recommended unless clinically warranted (ref: J Am Coll Radiol. 2015 Feb;12(2): 143-50). Disc levels: Generalized spondylitic spurring greatest at the midthoracic levels. Negative facets. CT LUMBAR SPINE FINDINGS Segmentation: 5 lumbar type  vertebrae Alignment: Normal Vertebrae: L4 mixed sclerotic and lucent lesion without acute fracture or evidence of soft tissue tumor extension. Dimensions are similar to a lumbar MR comparison. Paraspinal and other soft tissues: Negative Disc levels: Degenerative facet spurring especially at L3-4 and below. Lower lumbar disc bulging with L5-S1 endplate degeneration that has progressed from 2021 lumbar MRI. IMPRESSION: 1. No acute finding. 2. Unremarkable appearance of treated plasmacytoma at L4. 3. Lower lumbar facet osteoarthritis and focal advanced L5-S1 disc degeneration. Degenerative changes are progressed from 2021. Electronically Signed   By: Monte Fantasia M.D.   On: 09/12/2020 07:09   CT Angio Chest/Abd/Pel for Dissection W and/or Wo Contrast  Result Date: 09/12/2020 CLINICAL DATA:  Mid back pain that started 1 week ago EXAM: CT ANGIOGRAPHY CHEST, ABDOMEN AND PELVIS TECHNIQUE: Non-contrast CT of the chest was initially obtained. Multidetector CT imaging through the chest, abdomen and pelvis was performed using the standard protocol during bolus administration of intravenous contrast. Multiplanar reconstructed images and MIPs were obtained and reviewed to evaluate the vascular anatomy. CONTRAST:  121m OMNIPAQUE IOHEXOL 350 MG/ML SOLN COMPARISON:  PET CT 06/10/2019 FINDINGS: CTA CHEST FINDINGS Cardiovascular: Preferential opacification of the thoracic aorta. No evidence of thoracic aortic aneurysm or dissection. Normal heart size. No pericardial effusion. Mediastinum/Nodes: Residual thymus without nodularity or thickening. No adenopathy. Right thyroid nodule superimposed on background postoperative changes. Nodule measures 28 mm, similar to a thyroid ultrasound report from 2016. No follow-up recommended unless clinically warranted (ref: J Am Coll Radiol. 2015 Feb;12(2): 143-50). Lungs/Pleura: There is no edema, consolidation, effusion, or pneumothorax. Musculoskeletal: Spondylosis.  No acute finding.  Review of the  MIP images confirms the above findings. CTA ABDOMEN AND PELVIS FINDINGS VASCULAR Aorta: Unremarkable Celiac: Joint celiac and SMA except for the left gastric artery which arises proximally from the aorta. No stenosis or beading SMA: No stenosis or beading Renals: Normal IMA: Patent Inflow: Unremarkable Veins: Unremarkable in the arterial phase Review of the MIP images confirms the above findings. NON-VASCULAR Hepatobiliary: Hepatic steatosis.No evidence of biliary obstruction or stone. Pancreas: Unremarkable. Spleen: Heterogeneous enhancement with stable size and shape since prior PET-CT. Adrenals/Urinary Tract: Negative adrenals. No hydronephrosis or stone. Unremarkable bladder. Stomach/Bowel:  No obstruction. No visible bowel inflammation. Lymphatic: No mass or adenopathy. Reproductive:Hysterectomy Other: No ascites or pneumoperitoneum. Musculoskeletal: Treated L4 lesion. Specific lumbar findings are described separately. No acute fracture or subluxation. Review of the MIP images confirms the above findings. IMPRESSION: 1. Negative CTA of the aorta.  No explanation for pain. 2. Hepatic steatosis. Electronically Signed   By: Monte Fantasia M.D.   On: 09/12/2020 07:20     ASSESSMENT:  1. Plasmacytoma of L4 vertebral body: -MRI lumbar spine shows L4 lesion. -PET scan on 06/10/2019 shows mildly increased FDG uptake associated with mixed lytic/sclerotic lesion involving L4 vertebral body SUV 4.4. -Serum immunofixation shows IgG kappa monoclonal protein. SPEP-no M spike. Kappa light chains elevated at 25.3. Ratio 1.12. Lambda light chains 21.9. Beta-2 microglobulin 2.2. -24-hour urine was negative for UPEP and immunofixation. Protein was 130 mg. -L4 needle biopsy on 06/19/2019 showed minute segments of the bone and soft tissue, limited cellularity. IHC for CD138 highlights scattering plasma cells. Cytokeratin is negative. Few kappa positive plasma cells present. Overall material is very  limited and essentially nondiagnostic. -Clinically this is consistent with plasmacytoma. We reviewed bone marrow biopsy results which showed normocellular marrow. Cytogenetics are normal. -XRT to L4 vertebral body from 07/16/2019 through 08/22/2019. -MRI of the lumbar spine on 12/03/2019 showed unchanged size of the L4 vertebral body lesion, slightly decreased contrast-enhancement with no new lesions.  2. Low back pain: -She is taking half tablet of hydrocodone 10/325 every 6 hours as needed which is helping. -Likely this will improve upon completion of radiation.   PLAN:  1. Plasmacytoma of L4 vertebral body: -I have reviewed CT scan of the L-spine and thoracic spine from 09/12/2020 which showed unremarkable appearance of treated plasmacytoma at L4.  Lower lumbar facet osteoarthritis and focal advanced L5-S1 disc degeneration. - Recommend SPEP, free light chains and immunofixation.  2. Low back pain: -She has mild low back pain at the site of previous radiation. - However she developed new onset mid back pain about 1 week ago.  No history of trauma.  She reports pain as "strong hard pain", currently at 6 out of 10.  Pain is worse on deep breathing and certain movements.  She is taking ibuprofen as needed. - Since the pain is in the location of lower thoracic vertebra, I have recommended MRI of the thoracic spine with and without contrast. - RTC past MRI. - We will start her on hydrocodone 5/325 every 6 hours as needed.  3.  Severe hypokalemia: - Labs today showed potassium of 2.7.  She recently had hypokalemia while tested in the ER and took 3 days of potassium which she ran out. - We will start her on potassium 40 mEq daily.   Orders placed this encounter:  No orders of the defined types were placed in this encounter. Total time spent is 30 minutes with more than 50% of the time spent reviewing records from the ER visit,  discussing further management, counseling and coordination of  care.   Derek Jack, MD [place] [phone]   I, Thana Ates, am acting as a scribe for Dr. Derek Jack.  I, Derek Jack MD, have reviewed the above documentation for accuracy and completeness, and I agree with the above.

## 2020-09-12 NOTE — ED Notes (Signed)
Pt called out stating she is vomiting, EDP made aware and orders given

## 2020-09-12 NOTE — ED Notes (Signed)
Pt assisted to bathroom and back to bed 

## 2020-09-12 NOTE — ED Notes (Signed)
Patient transported to CT 

## 2020-09-12 NOTE — ED Triage Notes (Signed)
Pt c/o mid back pain that started about 1 wk ago. Pt has hx of CA in her lower spine. Pt called MD yesterday and was told he would call her in some pain medication but it wasn't ready when she went to pick it up.

## 2020-09-12 NOTE — ED Provider Notes (Signed)
[facility] EMERGENCY DEPARTMENT Provider Note   CSN: [id number] Arrival date & time: 09/12/20  0303     History Chief Complaint  Patient presents with  . Back Pain    Hannah Cooper is a 65 y.o. female.  Patient presents with 1 week of progressively worsening mid back pain.  Denies any fall or trauma.  She has chronic back pain in her lumbar spine from a plasmacytoma and bony tumor.  The pain she is now having is different than this and more superior.  She denies any fall or trauma.  The pain is only worsened over the past 1 week but became acutely severe this morning when she turned in bed.  Does not have any pain medication at home.  She denies any bowel or bladder incontinence.  Denies any radiation of the pain down her buttocks or legs.  No numbness, tingling, or weakness.  No chest pain or shortness of breath.  No abdominal pain. She is never had this kind of pain before and is not similar to her previous plasmacytoma pain. Denies any history of IV drug abuse.  Denies any fever or urinary symptoms.  Denies any chest pain or shortness of breath.   The history is provided by the patient.  Back Pain Associated symptoms: no chest pain, no dysuria, no fever, no headaches and no weakness        Past Medical History:  Diagnosis Date  . Arthritis   . Atypical chest pain    chronic  . Cancer ([place])    bone cancer  . Chronic back pain   . Chronic diastolic CHF (congestive heart failure) ([place])   . COPD (chronic obstructive pulmonary disease) ([place])   . DDD (degenerative disc disease), cervical   . History of radiation therapy 07/16/19-08/22/19   L4 spine ; Dr. Gery Pray  . Hyperlipidemia   . Hypertension   . Lumbar radiculopathy   . Meningitis   . Normal coronary arteries 2014  . Palpitations   . Premature atrial contractions   . PVC's (premature ventricular contractions)     Patient Active Problem List   Diagnosis Date Noted  . S/p left rotator cuff repair distal  clavicle resection 07/03/20  07/07/2020  . Complete tear of left rotator cuff   . 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[place]) 07/10/2019  . Plasma cell disorder 06/10/2019  . Lesion of bone of lumbosacral spine 05/28/2019  . Left-sided weakness 03/13/2016  . Cerebrovascular accident (CVA) ([place])   . Palpitations   . COPD (chronic obstructive pulmonary disease) ([place]) 08/20/2014  . Dyspnea 12/19/2013  . Chronic diastolic CHF (congestive heart failure) ([place]) 07/05/2013  . Lower extremity edema 07/05/2013  . Hyperlipidemia   . Chest pain 07/04/2013  . Hypokalemia 09/04/2012  . Precordial pain 09/04/2012  . Viral meningitis 01/22/2011  . Vomiting 01/22/2011  . PNEUMONIA, LEFT LOWER LOBE 10/10/2006  . DISEASE, ACUTE BRONCHOSPASM 10/10/2006  . Essential hypertension 05/23/2006  . OSTEOARTHRITIS 05/23/2006    Past Surgical History:  Procedure Laterality Date  . ABDOMINAL HYSTERECTOMY    . IR FLUORO GUIDED NEEDLE PLC ASPIRATION/INJECTION LOC  06/19/2019  . LEFT HEART CATHETERIZATION WITH CORONARY ANGIOGRAM N/A 09/05/2012   Procedure: LEFT HEART CATHETERIZATION WITH CORONARY ANGIOGRAM;  Surgeon: Wellington Hampshire, MD;  Location: [facility] CATH LAB;  Service: Cardiovascular;  Laterality: N/A;  . RESECTION DISTAL CLAVICAL Left 07/03/2020   Procedure: RESECTION DISTAL CLAVICAL;  Surgeon: Carole Civil, MD;  Location: [facility] ORS;  Service: Orthopedics;  Laterality: Left;  . SHOULDER OPEN ROTATOR CUFF REPAIR Left 07/03/2020   Procedure: ROTATOR CUFF REPAIR SHOULDER OPEN WITH CHROMEOPLASTY;  Surgeon: Carole Civil, MD;  Location: [facility] ORS;  Service: Orthopedics;  Laterality: Left;  . THYROID SURGERY  2002     OB History    Gravida      Para      Term      Preterm      AB      Living  0     SAB      IAB      Ectopic      Multiple      Live Births              Family History  Problem Relation Age of Onset  . Heart attack Mother 31  . Heart attack Sister 38    Social  History   Tobacco Use  . Smoking status: Never Smoker  . Smokeless tobacco: Never Used  Vaping Use  . Vaping Use: Never used  Substance Use Topics  . Alcohol use: No    Alcohol/week: 0.0 standard drinks  . Drug use: No    Home Medications Prior to Admission medications   Medication Sig Start Date End Date Taking? Authorizing Provider  albuterol (PROVENTIL) (2.5 MG/3ML) 0.083% nebulizer solution Take 2.5 mg by nebulization every 6 (six) hours as needed for wheezing or shortness of breath.     [provider]  albuterol (VENTOLIN HFA) 108 (90 Base) MCG/ACT inhaler Inhale 1-2 puffs into the lungs every 4 (four) hours as needed for wheezing or shortness of breath. 11/13/19   Varney Biles, MD  Apoaequorin (PREVAGEN) 10 MG CAPS Take 10 mg by mouth daily.    [provider]  chlorthalidone (HYGROTON) 50 MG tablet Take 1 tablet (50 mg total) by mouth daily. 04/20/20 07/19/20  Imogene Burn, PA-C  Cholecalciferol (DIALYVITE VITAMIN D 5000) 125 MCG (5000 UT) capsule Take 5,000 Units by mouth daily.    [provider]  cycloSPORINE (RESTASIS) 0.05 % ophthalmic emulsion Place 1 drop into both eyes 2 (two) times daily.    [provider]  diltiazem (CARDIZEM) 30 MG tablet Take 1 tablet by mouth twice daily 07/16/20   Arnoldo Lenis, MD  furosemide (LASIX) 20 MG tablet Take 1 tablet daily as needed may take 2 tablets prn Patient taking differently: Take 20-40 mg by mouth daily as needed for edema. 11/10/16   Arnoldo Lenis, MD  glipiZIDE-metformin (METAGLIP) 5-500 MG tablet Take 1 tablet by mouth in the morning and at bedtime. 09/27/19   [provider]  ibuprofen (ADVIL) 800 MG tablet Take 1 tablet (800 mg total) by mouth every 8 (eight) hours as needed. 08/13/20   Carole Civil, MD  Insulin Glargine [facility]) 100 UNIT/ML Inject 15 Units into the skin at bedtime as needed (blood sugar over 150). 10/04/19   [provider]   isosorbide mononitrate (IMDUR) 30 MG 24 hr tablet TAKE 1 TABLET (30 MG TOTAL) BY MOUTH DAILY. Patient taking differently: Take 30 mg by mouth daily. 04/19/17   Satira Sark, MD  lidocaine (LIDODERM) 5 % Place 2 patches onto the skin daily. Remove & Discard patch within 12 hours or as directed by MD 04/23/20   Ranell Patrick, Clide Deutscher, MD  losartan (COZAAR) 100 MG tablet Take 100 mg by mouth daily.    [provider]  metFORMIN (GLUCOPHAGE) 500 MG tablet Take 500 mg by mouth  2 (two) times daily. 07/13/20   [provider]  metoprolol tartrate (LOPRESSOR) 100 MG tablet Take 100 mg by mouth 2 (two) times daily.    [provider]  Multiple Vitamin (MULTIVITAMIN WITH MINERALS) TABS tablet Take 1 tablet by mouth daily.    [provider]  mupirocin ointment (BACTROBAN) 2 % Apply 1 application topically 3 (three) times daily as needed (wound care). 09/20/19   [provider]  RELION PEN NEEDLES 31G X 6 MM MISC USE 1 PEN NEEDLE TO INJECT INSULIN AT BEDTIME 04/03/20   [provider]  tiZANidine (ZANAFLEX) 4 MG tablet Take 1 tablet (4 mg total) by mouth daily. 09/10/20 09/10/21  Carole Civil, MD  TURMERIC PO Take 1,000 mg by mouth daily.    [provider]    Allergies    Other  Review of Systems   Review of Systems  Constitutional: Negative for activity change, appetite change, fatigue and fever.  HENT: Negative for congestion.   Respiratory: Negative for cough, chest tightness and shortness of breath.   Cardiovascular: Negative for chest pain.  Gastrointestinal: Negative for nausea and vomiting.  Genitourinary: Negative for dysuria and hematuria.  Musculoskeletal: Positive for arthralgias, back pain and myalgias.  Skin: Negative for rash.  Neurological: Negative for dizziness, weakness and headaches.   all other systems are negative except as noted in the HPI and PMH.    Physical Exam Updated Vital Signs BP (!) 147/82   Pulse  63   Temp 98.1 F (36.7 C) (Oral)   Resp 17   Ht 5\' 5"  (1.651 m)   Wt 99.8 kg   SpO2 99%   BMI 36.61 kg/m   Physical Exam Vitals and nursing note reviewed.  Constitutional:      General: She is not in acute distress.    Appearance: She is well-developed.  HENT:     Head: Normocephalic and atraumatic.     Mouth/Throat:     Pharynx: No oropharyngeal exudate.  Eyes:     Conjunctiva/sclera: Conjunctivae normal.     Pupils: Pupils are equal, round, and reactive to light.  Neck:     Comments: No meningismus. Cardiovascular:     Rate and Rhythm: Normal rate and regular rhythm.     Heart sounds: Normal heart sounds. No murmur heard.   Pulmonary:     Effort: Pulmonary effort is normal. No respiratory distress.     Breath sounds: Normal breath sounds.  Abdominal:     Palpations: Abdomen is soft.     Tenderness: There is no abdominal tenderness. There is no guarding or rebound.  Musculoskeletal:        General: No tenderness. Normal range of motion.     Cervical back: Normal range of motion and neck supple.     Comments: Midline tenderness to lower thoracic and upper lumbar spine without step-off or deformity.  5/5 strength in bilateral lower extremities. Ankle plantar and dorsiflexion intact. Great toe extension intact bilaterally. +2 DP and PT pulses. +2 patellar reflexes bilaterally.    Skin:    General: Skin is warm.  Neurological:     Mental Status: She is alert and oriented to person, place, and time.     Cranial Nerves: No cranial nerve deficit.     Motor: No abnormal muscle tone.     Coordination: Coordination normal.     Comments: No ataxia on finger to nose bilaterally. No pronator drift. 5/5 strength throughout. CN 2-12 intact.Equal grip strength. Sensation  intact.   Psychiatric:        Behavior: Behavior normal.     ED Results / Procedures / Treatments   Labs (all labs ordered are listed, but only abnormal results are displayed) Labs Reviewed  CBC WITH  DIFFERENTIAL/PLATELET - Abnormal; Notable for the following components:      Result Value   WBC 3.8 (*)    RBC 3.83 (*)    Hemoglobin 11.7 (*)    HCT 35.4 (*)    All other components within normal limits  COMPREHENSIVE METABOLIC PANEL - Abnormal; Notable for the following components:   Potassium 2.8 (*)    Chloride 96 (*)    Glucose, Bld 145 (*)    All other components within normal limits  TROPONIN I (HIGH SENSITIVITY)  TROPONIN I (HIGH SENSITIVITY)    EKG EKG Interpretation  Date/Time:  Saturday Sep 12 2020 04:36:24 EDT Ventricular Rate:  61 PR Interval:  176 QRS Duration: 87 QT Interval:  468 QTC Calculation: 472 R Axis:   38 Text Interpretation: Sinus rhythm Abnormal R-wave progression, early transition Borderline T abnormalities, inferior leads Nonspecific T wave abnormality Confirmed by Ezequiel Essex [phone]) on 09/12/2020 4:49:19 AM   Radiology CT T-SPINE NO CHARGE  Result Date: 09/12/2020 CLINICAL DATA:  Mid back pain that started 1 week ago. History of cancer (plasmacytoma at L4) in lower spine. EXAM: CT Thoracic and Lumbar spine with contrast TECHNIQUE: Multiplanar CT images of the thoracic and lumbar spine were reconstructed from contemporary CT of the Chest, Abdomen, and Pelvis CONTRAST:  None additional COMPARISON:  Lumbar MRI 12/03/2019 FINDINGS: CT THORACIC SPINE FINDINGS Alignment: Exaggerated kyphosis.  No listhesis Vertebrae: Negative for fracture, erosion, or bone lesion. Paraspinal and other soft tissues: Reported separately. Right thyroid nodule measuring 2.8 cm with atrophy of the remaining gland. Dimensions are similar to a thyroid ultrasound in 2016. There is history of partial thyroidectomy. No follow-up recommended unless clinically warranted (ref: J Am Coll Radiol. 2015 Feb;12(2): 143-50). Disc levels: Generalized spondylitic spurring greatest at the midthoracic levels. Negative facets. CT LUMBAR SPINE FINDINGS Segmentation: 5 lumbar type vertebrae  Alignment: Normal Vertebrae: L4 mixed sclerotic and lucent lesion without acute fracture or evidence of soft tissue tumor extension. Dimensions are similar to a lumbar MR comparison. Paraspinal and other soft tissues: Negative Disc levels: Degenerative facet spurring especially at L3-4 and below. Lower lumbar disc bulging with L5-S1 endplate degeneration that has progressed from 2021 lumbar MRI. IMPRESSION: 1. No acute finding. 2. Unremarkable appearance of treated plasmacytoma at L4. 3. Lower lumbar facet osteoarthritis and focal advanced L5-S1 disc degeneration. Degenerative changes are progressed from 2021. Electronically Signed   By: Monte Fantasia M.D.   On: 09/12/2020 07:09   CT L-SPINE NO CHARGE  Result Date: 09/12/2020 CLINICAL DATA:  Mid back pain that started 1 week ago. History of cancer (plasmacytoma at L4) in lower spine. EXAM: CT Thoracic and Lumbar spine with contrast TECHNIQUE: Multiplanar CT images of the thoracic and lumbar spine were reconstructed from contemporary CT of the Chest, Abdomen, and Pelvis CONTRAST:  None additional COMPARISON:  Lumbar MRI 12/03/2019 FINDINGS: CT THORACIC SPINE FINDINGS Alignment: Exaggerated kyphosis.  No listhesis Vertebrae: Negative for fracture, erosion, or bone lesion. Paraspinal and other soft tissues: Reported separately. Right thyroid nodule measuring 2.8 cm with atrophy of the remaining gland. Dimensions are similar to a thyroid ultrasound in 2016. There is history of partial thyroidectomy. No follow-up recommended unless clinically warranted (ref: J Am Coll Radiol. 2015 Feb;12(2): 143-50). Disc  levels: Generalized spondylitic spurring greatest at the midthoracic levels. Negative facets. CT LUMBAR SPINE FINDINGS Segmentation: 5 lumbar type vertebrae Alignment: Normal Vertebrae: L4 mixed sclerotic and lucent lesion without acute fracture or evidence of soft tissue tumor extension. Dimensions are similar to a lumbar MR comparison. Paraspinal and other soft  tissues: Negative Disc levels: Degenerative facet spurring especially at L3-4 and below. Lower lumbar disc bulging with L5-S1 endplate degeneration that has progressed from 2021 lumbar MRI. IMPRESSION: 1. No acute finding. 2. Unremarkable appearance of treated plasmacytoma at L4. 3. Lower lumbar facet osteoarthritis and focal advanced L5-S1 disc degeneration. Degenerative changes are progressed from 2021. Electronically Signed   By: Monte Fantasia M.D.   On: 09/12/2020 07:09   CT Angio Chest/Abd/Pel for Dissection W and/or Wo Contrast  Result Date: 09/12/2020 CLINICAL DATA:  Mid back pain that started 1 week ago EXAM: CT ANGIOGRAPHY CHEST, ABDOMEN AND PELVIS TECHNIQUE: Non-contrast CT of the chest was initially obtained. Multidetector CT imaging through the chest, abdomen and pelvis was performed using the standard protocol during bolus administration of intravenous contrast. Multiplanar reconstructed images and MIPs were obtained and reviewed to evaluate the vascular anatomy. CONTRAST:  148mL OMNIPAQUE IOHEXOL 350 MG/ML SOLN COMPARISON:  PET CT 06/10/2019 FINDINGS: CTA CHEST FINDINGS Cardiovascular: Preferential opacification of the thoracic aorta. No evidence of thoracic aortic aneurysm or dissection. Normal heart size. No pericardial effusion. Mediastinum/Nodes: Residual thymus without nodularity or thickening. No adenopathy. Right thyroid nodule superimposed on background postoperative changes. Nodule measures 28 mm, similar to a thyroid ultrasound report from 2016. No follow-up recommended unless clinically warranted (ref: J Am Coll Radiol. 2015 Feb;12(2): 143-50). Lungs/Pleura: There is no edema, consolidation, effusion, or pneumothorax. Musculoskeletal: Spondylosis.  No acute finding. Review of the MIP images confirms the above findings. CTA ABDOMEN AND PELVIS FINDINGS VASCULAR Aorta: Unremarkable Celiac: Joint celiac and SMA except for the left gastric artery which arises proximally from the aorta. No  stenosis or beading SMA: No stenosis or beading Renals: Normal IMA: Patent Inflow: Unremarkable Veins: Unremarkable in the arterial phase Review of the MIP images confirms the above findings. NON-VASCULAR Hepatobiliary: Hepatic steatosis.No evidence of biliary obstruction or stone. Pancreas: Unremarkable. Spleen: Heterogeneous enhancement with stable size and shape since prior PET-CT. Adrenals/Urinary Tract: Negative adrenals. No hydronephrosis or stone. Unremarkable bladder. Stomach/Bowel:  No obstruction. No visible bowel inflammation. Lymphatic: No mass or adenopathy. Reproductive:Hysterectomy Other: No ascites or pneumoperitoneum. Musculoskeletal: Treated L4 lesion. Specific lumbar findings are described separately. No acute fracture or subluxation. Review of the MIP images confirms the above findings. IMPRESSION: 1. Negative CTA of the aorta.  No explanation for pain. 2. Hepatic steatosis. Electronically Signed   By: Monte Fantasia M.D.   On: 09/12/2020 07:20    Procedures Procedures   Medications Ordered in ED Medications  HYDROmorphone (DILAUDID) injection 1 mg (has no administration in time range)    ED Course  I have reviewed the triage vital signs and the nursing notes.  Pertinent labs & imaging results that were available during my care of the patient were reviewed by me and considered in my medical decision making (see chart for details).    MDM Rules/Calculators/A&P                         Patient with a history of plasmacytoma to L4 and chronic low back pain here with pain higher up in her spine that became acutely worse this morning. She has no bowel or bladder incontinence.  No focal weakness, numbness or tingling.  No chest pain or shortness of breath.  Patient describes pain that is more superior in her back than she is used to it radiates to her abdomen and chest.  She does appear to be quite comfortable and is normotensive. However given her atypical nature of pain with  tearing to her back will obtain CT angiogram to rule out aortic pathology including dissection.  Will also expect to evaluate bones for possible compression fracture.  Low suspicion for cord compression or cauda equina.  She is neurologically intact with intact strength, sensation, pulses and reflexes.  Imaging shows no aortic dissection or other acute pathology.  No explanation for patient's pain.  Her L4 lesion appears to be stable.  No new bony lesions identified.  Patient feels improved and is tolerating p.o. and ambulatory.  She has pain medication called in by her oncologist but was not available when she went to get yesterday. No definitive cause of pain identified but surgical and emergent pathology appears to be ruled out.  Doubt ACS with negative troponins and unchanged EKG. Gallbladder appears normal on imaging. Low suspicion for cord compression or cauda equina. No apparent indication for emergent MRI which is currently not available at this facility.  Followup with her oncologist. Return precautions discussed.     Final Clinical Impression(s) / ED Diagnoses Final diagnoses:  Back pain  Back pain    Rx / DC Orders ED Discharge Orders    None       Naseem Adler, Annie Main, MD 09/12/20 [id number]

## 2020-09-12 NOTE — Discharge Instructions (Addendum)
Please take Tylenol and topical over-the-counter pain medication for your discomfort.  I have also called in some nausea medication.  Keep your outpatient appointments and return to the emergency department any new or suddenly worsening symptoms.

## 2020-09-12 NOTE — ED Provider Notes (Signed)
Blood pressure 109/88, pulse 63, temperature 98.1 F (36.7 C), temperature source Oral, resp. rate 17, height 5\' 5"  (1.651 m), weight 99.8 kg, SpO2 95 %.  Assuming care from Dr. Wyvonnia Dusky.  In short, Hannah Cooper is a 65 y.o. female with a chief complaint of Back Pain .  Refer to the original H&P for additional details.  The current plan of care is to follow up with repeat troponin.  Patient's labs and imaging reviewed.  Troponin negative x2.  She does have some hypokalemia and Dr. Wyvonnia Dusky sent supplemental potassium to the pharmacy.  Patient had some vomiting after CT scan.  She was given Zofran in the ED and feeling improved.  Sent a prescription for Zofran as well to the pharmacy.  Discussed follow-up plan and ED return precautions.    Margette Fast, MD 09/12/20 [phone]

## 2020-09-15 ENCOUNTER — Inpatient Hospital Stay (HOSPITAL_COMMUNITY): Payer: HMO

## 2020-09-15 ENCOUNTER — Inpatient Hospital Stay (HOSPITAL_COMMUNITY): Payer: HMO | Attending: Hematology | Admitting: Hematology

## 2020-09-15 ENCOUNTER — Encounter (HOSPITAL_COMMUNITY): Payer: Self-pay | Admitting: Occupational Therapy

## 2020-09-15 ENCOUNTER — Other Ambulatory Visit (HOSPITAL_COMMUNITY): Payer: Self-pay | Admitting: *Deleted

## 2020-09-15 ENCOUNTER — Other Ambulatory Visit: Payer: Self-pay

## 2020-09-15 ENCOUNTER — Ambulatory Visit (HOSPITAL_COMMUNITY): Payer: PRIVATE HEALTH INSURANCE | Admitting: Occupational Therapy

## 2020-09-15 ENCOUNTER — Telehealth (HOSPITAL_COMMUNITY): Payer: Self-pay | Admitting: *Deleted

## 2020-09-15 VITALS — BP 125/71 | HR 80 | Temp 96.9°F | Resp 18 | Wt 221.7 lb

## 2020-09-15 DIAGNOSIS — C9030 Solitary plasmacytoma not having achieved remission: Secondary | ICD-10-CM | POA: Diagnosis not present

## 2020-09-15 DIAGNOSIS — E785 Hyperlipidemia, unspecified: Secondary | ICD-10-CM | POA: Insufficient documentation

## 2020-09-15 DIAGNOSIS — Z923 Personal history of irradiation: Secondary | ICD-10-CM | POA: Insufficient documentation

## 2020-09-15 DIAGNOSIS — I110 Hypertensive heart disease with heart failure: Secondary | ICD-10-CM | POA: Insufficient documentation

## 2020-09-15 DIAGNOSIS — J449 Chronic obstructive pulmonary disease, unspecified: Secondary | ICD-10-CM | POA: Diagnosis not present

## 2020-09-15 DIAGNOSIS — M25612 Stiffness of left shoulder, not elsewhere classified: Secondary | ICD-10-CM

## 2020-09-15 DIAGNOSIS — E041 Nontoxic single thyroid nodule: Secondary | ICD-10-CM | POA: Diagnosis not present

## 2020-09-15 DIAGNOSIS — I5032 Chronic diastolic (congestive) heart failure: Secondary | ICD-10-CM | POA: Insufficient documentation

## 2020-09-15 DIAGNOSIS — E876 Hypokalemia: Secondary | ICD-10-CM | POA: Insufficient documentation

## 2020-09-15 DIAGNOSIS — R29898 Other symptoms and signs involving the musculoskeletal system: Secondary | ICD-10-CM

## 2020-09-15 DIAGNOSIS — Z79899 Other long term (current) drug therapy: Secondary | ICD-10-CM | POA: Diagnosis not present

## 2020-09-15 DIAGNOSIS — M25512 Pain in left shoulder: Secondary | ICD-10-CM

## 2020-09-15 DIAGNOSIS — M899 Disorder of bone, unspecified: Secondary | ICD-10-CM

## 2020-09-15 LAB — CBC WITH DIFFERENTIAL/PLATELET
Abs Immature Granulocytes: 0.01 10*3/uL (ref 0.00–0.07)
Basophils Absolute: 0 10*3/uL (ref 0.0–0.1)
Basophils Relative: 0 %
Eosinophils Absolute: 0.2 10*3/uL (ref 0.0–0.5)
Eosinophils Relative: 5 %
HCT: 33.7 % — ABNORMAL LOW (ref 36.0–46.0)
Hemoglobin: 11.6 g/dL — ABNORMAL LOW (ref 12.0–15.0)
Immature Granulocytes: 0 %
Lymphocytes Relative: 26 %
Lymphs Abs: 0.9 10*3/uL (ref 0.7–4.0)
MCH: 31.4 pg (ref 26.0–34.0)
MCHC: 34.4 g/dL (ref 30.0–36.0)
MCV: 91.3 fL (ref 80.0–100.0)
Monocytes Absolute: 0.4 10*3/uL (ref 0.1–1.0)
Monocytes Relative: 11 %
Neutro Abs: 2.1 10*3/uL (ref 1.7–7.7)
Neutrophils Relative %: 58 %
Platelets: 310 10*3/uL (ref 150–400)
RBC: 3.69 MIL/uL — ABNORMAL LOW (ref 3.87–5.11)
RDW: 13.2 % (ref 11.5–15.5)
WBC: 3.6 10*3/uL — ABNORMAL LOW (ref 4.0–10.5)
nRBC: 0 % (ref 0.0–0.2)

## 2020-09-15 LAB — COMPREHENSIVE METABOLIC PANEL
ALT: 27 U/L (ref 0–44)
AST: 24 U/L (ref 15–41)
Albumin: 4.2 g/dL (ref 3.5–5.0)
Alkaline Phosphatase: 62 U/L (ref 38–126)
Anion gap: 5 (ref 5–15)
BUN: 16 mg/dL (ref 8–23)
CO2: 32 mmol/L (ref 22–32)
Calcium: 9.3 mg/dL (ref 8.9–10.3)
Chloride: 98 mmol/L (ref 98–111)
Creatinine, Ser: 0.82 mg/dL (ref 0.44–1.00)
GFR, Estimated: >60 mL/min (ref >60)
Glucose, Bld: 139 mg/dL — ABNORMAL HIGH (ref 70–99)
Potassium: 2.7 mmol/L — CL (ref 3.5–5.1)
Sodium: 135 mmol/L (ref 135–145)
Total Bilirubin: 0.6 mg/dL (ref 0.3–1.2)
Total Protein: 8.1 g/dL (ref 6.5–8.1)

## 2020-09-15 LAB — LACTATE DEHYDROGENASE: LDH: 168 U/L (ref 98–192)

## 2020-09-15 MED ORDER — POTASSIUM CHLORIDE CRYS ER 20 MEQ PO TBCR: 40.0000 meq | EXTENDED_RELEASE_TABLET | Freq: Every day | ORAL | 3 refills | Status: DC

## 2020-09-15 MED ORDER — HYDROCODONE-ACETAMINOPHEN 5-325 MG PO TABS: 1.0000 | ORAL_TABLET | Freq: Four times a day (QID) | ORAL | 0 refills | Status: DC | PRN

## 2020-09-15 NOTE — Telephone Encounter (Signed)
Has appointment today

## 2020-09-15 NOTE — Patient Instructions (Addendum)
[place] at [facility] Discharge Instructions  You were seen today by Dr. Delton Coombes. He went over your recent results. You will be scheduled for an MRI of your thoracic spine prior to your next visit. You have been prescribed Hydrocodone to be taken every 6 hours as needed for pain. Dr. Delton Coombes will see you back in after your MRI for follow up.   Thank you for choosing [place] at [facility] to provide your oncology and hematology care.  To afford each patient quality time with our provider, please arrive at least 15 minutes before your scheduled appointment time.   If you have a lab appointment with the [facility] please come in thru the Main Entrance and check in at the main information desk  You need to re-schedule your appointment should you arrive 10 or more minutes late.  We strive to give you quality time with our providers, and arriving late affects you and other patients whose appointments are after yours.  Also, if you no show three or more times for appointments you may be dismissed from the clinic at the providers discretion.     Again, thank you for choosing [facility].  Our hope is that these requests will decrease the amount of time that you wait before being seen by our physicians.       _____________________________________________________________  Should you have questions after your visit to [facility], please contact our office at [phone] between the hours of 8:00 a.m. and 4:30 p.m.  Voicemails left after 4:00 p.m. will not be returned until the following business day.  For prescription refill requests, have your pharmacy contact our office and allow 72 hours.    Cancer Center Support Programs:   > Cancer Support Group  2nd Tuesday of the month 1pm-2pm, Journey Room

## 2020-09-15 NOTE — Telephone Encounter (Signed)
Potassium 2.7 today.  Clarified that patient has not been taking last couple of days, as she was only given 6 tablets on the 28th.  Script sent in for 40 meq daily per Dr Delton Coombes.  Patient made aware and understands to seek medical attention for chest pain, palpitations or SOB.

## 2020-09-15 NOTE — Therapy (Signed)
[place], [place], [address] Phone: [phone]   Fax:  [phone]  Occupational Therapy Treatment  Patient Details  Name: Hannah Cooper MRN: [id number] Date of Birth: [birth year] Referring Provider (OT): Arther Abbott, MD   Encounter Date: 09/15/2020   OT End of Session - 09/15/20 1050    Visit Number 18    Number of Visits 24    Date for OT Re-Evaluation 10/07/20    Authorization Type 1) [employer] 2) [employer]    Authorization Time Period no copay. no visit limit Fax initial evaluation for medical necessity.    Progress Note Due on Visit 27    OT Start Time [phone]    OT Stop Time 1030    OT Time Calculation (min) 39 min    Activity Tolerance Patient tolerated treatment well    Behavior During Therapy WFL for tasks assessed/performed           Past Medical History:  Diagnosis Date  . Arthritis   . Atypical chest pain    chronic  . Cancer ([place])    bone cancer  . Chronic back pain   . Chronic diastolic CHF (congestive heart failure) ([place])   . COPD (chronic obstructive pulmonary disease) ([place])   . DDD (degenerative disc disease), cervical   . History of radiation therapy 07/16/19-08/22/19   L4 spine ; Dr. Gery Pray  . Hyperlipidemia   . Hypertension   . Lumbar radiculopathy   . Meningitis   . Normal coronary arteries 2014  . Palpitations   . Premature atrial contractions   . PVC's (premature ventricular contractions)     Past Surgical History:  Procedure Laterality Date  . ABDOMINAL HYSTERECTOMY    . IR FLUORO GUIDED NEEDLE PLC ASPIRATION/INJECTION LOC  06/19/2019  . LEFT HEART CATHETERIZATION WITH CORONARY ANGIOGRAM N/A 09/05/2012   Procedure: LEFT HEART CATHETERIZATION WITH CORONARY ANGIOGRAM;  Surgeon: Wellington Hampshire, MD;  Location: [facility];  Service: Cardiovascular;  Laterality: N/A;  . RESECTION DISTAL CLAVICAL Left 07/03/2020   Procedure: RESECTION DISTAL  CLAVICAL;  Surgeon: Carole Civil, MD;  Location: [facility];  Service: Orthopedics;  Laterality: Left;  . SHOULDER OPEN ROTATOR CUFF REPAIR Left 07/03/2020   Procedure: ROTATOR CUFF REPAIR SHOULDER OPEN WITH CHROMEOPLASTY;  Surgeon: Carole Civil, MD;  Location: [facility];  Service: Orthopedics;  Laterality: Left;  . THYROID SURGERY  2002    There were no vitals filed for this visit.   Subjective Assessment - 09/15/20 0953    Subjective  S: I'm using it at home but not picking up anything heavy.    Currently in Pain? Yes    Pain Score 4     Pain Location Shoulder    Pain Orientation Left    Pain Descriptors / Indicators Aching;Sore    Pain Type Acute pain    Pain Radiating Towards down arm    Pain Onset Yesterday    Pain Frequency Constant    Aggravating Factors  positioning    Pain Relieving Factors pain medication, ice    Effect of Pain on Daily Activities mod effect on ADLs    Multiple Pain Sites No              [facility] OT Assessment - 09/15/20 0951      Assessment   Medical Diagnosis Left RTC repair      Precautions   Precautions Shoulder;Other (comment)    Type of Shoulder  Precautions Standard RCR protocol   Sling on 4-6 weeks   0-6 weeks (3/18-4/29)-: PROM, pendulum   6-12 weeks (4/29-6/10): AAROM progressing to AROM   12-24 weeks: Strengthening exercises    Precaution Comments History of Plasmocytoma.                    OT Treatments/Exercises (OP) - 09/15/20 0955      Exercises   Exercises Shoulder      Shoulder Exercises: Supine   Protraction PROM;5 reps;AROM;10 reps    Horizontal ABduction PROM;5 reps;AROM;10 reps    External Rotation PROM;5 reps;AROM;10 reps    Internal Rotation PROM;5 reps;AROM;10 reps    Flexion PROM;5 reps;AROM;10 reps    ABduction PROM;5 reps;AROM;10 reps      Shoulder Exercises: Standing   Protraction AROM;10 reps    Horizontal ABduction AROM;10 reps    External Rotation AROM;10 reps    Internal Rotation AROM;10  reps    Flexion AROM;10 reps    ABduction AROM;10 reps    Extension Theraband;10 reps    Theraband Level (Shoulder Extension) Level 2 (Red)    Row Theraband;10 reps    Theraband Level (Shoulder Row) Level 2 (Red)    Retraction Theraband;10 reps    Theraband Level (Shoulder Retraction) Level 2 (Red)      Shoulder Exercises: ROM/Strengthening   Proximal Shoulder Strengthening, Supine 10X each, no rest breaks    Proximal Shoulder Strengthening, Seated 10X each, no rest breaks    Other ROM/Strengthening Exercises proximal shoulder strengthening on doorway using washcloth, 1' flexion      Functional Reaching Activities   High Level Pt placing 10 cones on top shelf of overhead cabinet, removing in abduction      Manual Therapy   Manual Therapy Myofascial release    Manual therapy comments manual therapy completed seperately from all other interventions this date    Myofascial Release myofascial release and manual stretching to left upper arm, trapezius, and scapular regions to decrease pain and fascial restrictions and increase joint ROM                    OT Short Term Goals - 09/09/20 0805      OT SHORT TERM GOAL #1   Title Patient will be educated and independent with HEP in order to facilitate her progress in therapy and begin to use her LUE as her non dominant extremity for 50% or more of daily tasks.    Time 6    Period Weeks    Status On-going    Target Date 08/26/20      OT SHORT TERM GOAL #2   Title patient will increase her LUE P/ROM to [facility] in order to increase ability to complete upper body dressing tasks.    Time 6    Period Weeks    Status Achieved      OT SHORT TERM GOAL #3   Title Patient will increase her LUE strength to 3/5 in order to complete functional waist level self care tasks with less difficulty.    Time 6    Period Weeks    Status Achieved      OT SHORT TERM GOAL #4   Title Patient will report a decrease in pain for her LUE of approximately  6/10 or less while completing dressing and bathing tasks.    Time 6    Period Weeks    Status Achieved      OT SHORT TERM GOAL #5  Title Patient will decrease LUE fascial restrictions to moderate amount in order to increase her functional mobility needed to complete low level reaching tasks.    Time 6    Period Weeks    Status Achieved             OT Long Term Goals - 09/09/20 1443      OT LONG TERM GOAL #1   Title Patient will increase her LUE A/ROM to [facility] in order to complete reaching tasks at or above her shoulder level.    Time 12    Period Weeks    Status On-going      OT LONG TERM GOAL #2   Title Patient will increase her LUE strength to 4/5 or better in order to return to household lifting tasks while using her LUE.    Time 12    Period Weeks    Status On-going      OT LONG TERM GOAL #3   Title Patient will report a decrease in pain level in her LUE to 3/10 or less while completing daily tasks.    Time 12    Period Weeks    Status On-going      OT LONG TERM GOAL #4   Title Patient will decrease her LUE fascial restrictions to min amount or less in order to increase the functional mobility needed to complete reaching tasks.    Time 12    Period Weeks    Status On-going                 Plan - 09/15/20 1050    Clinical Impression Statement A: Pt reports MD is pleased with her progress. Continued with manual techniques, passive stretching, and A/ROM today. Pt completing functional reaching task and added scapular strengthening with red theraband. Also added proximal shoulder strengthening at doorway. Verbal cuing for form and technique.    Plan P: Continue with scapular strengthening, add UBE, attempt ball on wall    OT Home Exercise Plan eval: A/ROM elbow, wrist, forearm, seated A/ROM scapular exercises; 4/6: pendulums; 4/15: table slides 4/27: AA/ROM er supine 5/5: AA/ROm supine except abduction 5/10: standing AA/ROM protraction, abduction, flexion 5/12:  AA/ROM standing (all); 5/25: A/ROM           Patient will benefit from skilled therapeutic intervention in order to improve the following deficits and impairments:           Visit Diagnosis: Stiffness of left shoulder, not elsewhere classified  Acute pain of left shoulder  Other symptoms and signs involving the musculoskeletal system    Problem List Patient Active Problem List   Diagnosis Date Noted  . S/p left rotator cuff repair distal clavicle resection 07/03/20  07/07/2020  . Complete tear of left rotator cuff   . 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[place]) 07/10/2019  . Plasma cell disorder 06/10/2019  . Lesion of bone of lumbosacral spine 05/28/2019  . Left-sided weakness 03/13/2016  . Cerebrovascular accident (CVA) ([place])   . Palpitations   . COPD (chronic obstructive pulmonary disease) ([place]) 08/20/2014  . Dyspnea 12/19/2013  . Chronic diastolic CHF (congestive heart failure) ([place]) 07/05/2013  . Lower extremity edema 07/05/2013  . Hyperlipidemia   . Chest pain 07/04/2013  . Hypokalemia 09/04/2012  . Precordial pain 09/04/2012  . Viral meningitis 01/22/2011  . Vomiting 01/22/2011  . PNEUMONIA, LEFT LOWER LOBE 10/10/2006  . DISEASE, ACUTE BRONCHOSPASM 10/10/2006  . Essential hypertension 05/23/2006  . OSTEOARTHRITIS 05/23/2006   Hannah Cooper,  OTR/L  [phone] 09/15/2020, 10:52 AM  [facility] [address] [place], [place], [address] Phone: [phone]   Fax:  [phone]  Name: Hannah Cooper MRN: [id number] Date of Birth: 01-03-1956

## 2020-09-16 LAB — KAPPA/LAMBDA LIGHT CHAINS
Kappa free light chain: 36.2 mg/L — ABNORMAL HIGH (ref 3.3–19.4)
Kappa, lambda light chain ratio: 1.73 — ABNORMAL HIGH (ref 0.26–1.65)
Lambda free light chains: 20.9 mg/L (ref 5.7–26.3)

## 2020-09-16 NOTE — Progress Notes (Signed)
CRITICAL VALUE STICKER  CRITICAL VALUE:  K+ 2.7  RECEIVER (on-site recipient of call):  A. Ouida Sills, RN  DATE & TIME NOTIFIED: 09/15/2020 at 58  MD NOTIFIED: Delton Coombes  RESPONSE: Rx sent for K-Dur 40 mEq daily

## 2020-09-17 ENCOUNTER — Encounter (HOSPITAL_COMMUNITY): Payer: Self-pay

## 2020-09-17 ENCOUNTER — Other Ambulatory Visit: Payer: Self-pay

## 2020-09-17 ENCOUNTER — Ambulatory Visit (HOSPITAL_COMMUNITY): Payer: PRIVATE HEALTH INSURANCE | Attending: Orthopedic Surgery

## 2020-09-17 DIAGNOSIS — M25612 Stiffness of left shoulder, not elsewhere classified: Secondary | ICD-10-CM | POA: Insufficient documentation

## 2020-09-17 DIAGNOSIS — R29898 Other symptoms and signs involving the musculoskeletal system: Secondary | ICD-10-CM | POA: Insufficient documentation

## 2020-09-17 DIAGNOSIS — M25512 Pain in left shoulder: Secondary | ICD-10-CM | POA: Diagnosis not present

## 2020-09-17 LAB — PROTEIN ELECTROPHORESIS, SERUM
A/G Ratio: 1.1 (ref 0.7–1.7)
Albumin ELP: 4 g/dL (ref 2.9–4.4)
Alpha-1-Globulin: 0.2 g/dL (ref 0.0–0.4)
Alpha-2-Globulin: 0.6 g/dL (ref 0.4–1.0)
Beta Globulin: 1.1 g/dL (ref 0.7–1.3)
Gamma Globulin: 1.8 g/dL (ref 0.4–1.8)
Globulin, Total: 3.7 g/dL (ref 2.2–3.9)
Total Protein ELP: 7.7 g/dL (ref 6.0–8.5)

## 2020-09-17 LAB — IMMUNOFIXATION ELECTROPHORESIS
IgA: 260 mg/dL (ref 87–352)
IgA: 267 mg/dL (ref 87–352)
IgG (Immunoglobin G), Serum: 1820 mg/dL — ABNORMAL HIGH (ref 586–1602)
IgG (Immunoglobin G), Serum: 1835 mg/dL — ABNORMAL HIGH (ref 586–1602)
IgM (Immunoglobulin M), Srm: 134 mg/dL (ref 26–217)
IgM (Immunoglobulin M), Srm: 137 mg/dL (ref 26–217)
Total Protein ELP: 7.7 g/dL (ref 6.0–8.5)
Total Protein ELP: 7.9 g/dL (ref 6.0–8.5)

## 2020-09-17 NOTE — Therapy (Signed)
[facility] [place], [place], [address] Phone: [phone]   Fax:  [phone]  Occupational Therapy Treatment  Patient Details  Name: Hannah Cooper MRN: [id number] Date of Birth: April 30, 1955 Referring Provider (OT): Arther Abbott, MD   Encounter Date: 09/17/2020   OT End of Session - 09/17/20 0838    Visit Number 19    Number of Visits 24    Date for OT Re-Evaluation 10/07/20    Authorization Type 1) [employer] 2) [employer]    Authorization Time Period no copay. no visit limit Fax initial evaluation for medical necessity.    Progress Note Due on Visit 27    OT Start Time [phone]    OT Stop Time 0859    OT Time Calculation (min) 38 min    Activity Tolerance Patient tolerated treatment well    Behavior During Therapy [facility] for tasks assessed/performed           Past Medical History:  Diagnosis Date  . Arthritis   . Atypical chest pain    chronic  . Cancer ([place])    bone cancer  . Chronic back pain   . Chronic diastolic CHF (congestive heart failure) ([place])   . COPD (chronic obstructive pulmonary disease) ([place])   . DDD (degenerative disc disease), cervical   . History of radiation therapy 07/16/19-08/22/19   L4 spine ; Dr. Gery Pray  . Hyperlipidemia   . Hypertension   . Lumbar radiculopathy   . Meningitis   . Normal coronary arteries 2014  . Palpitations   . Premature atrial contractions   . PVC's (premature ventricular contractions)     Past Surgical History:  Procedure Laterality Date  . ABDOMINAL HYSTERECTOMY    . IR FLUORO GUIDED NEEDLE PLC ASPIRATION/INJECTION LOC  06/19/2019  . LEFT HEART CATHETERIZATION WITH CORONARY ANGIOGRAM N/A 09/05/2012   Procedure: LEFT HEART CATHETERIZATION WITH CORONARY ANGIOGRAM;  Surgeon: Wellington Hampshire, MD;  Location: [facility];  Service: Cardiovascular;  Laterality: N/A;  . RESECTION DISTAL CLAVICAL Left 07/03/2020   Procedure: RESECTION DISTAL  CLAVICAL;  Surgeon: Carole Civil, MD;  Location: [facility];  Service: Orthopedics;  Laterality: Left;  . SHOULDER OPEN ROTATOR CUFF REPAIR Left 07/03/2020   Procedure: ROTATOR CUFF REPAIR SHOULDER OPEN WITH CHROMEOPLASTY;  Surgeon: Carole Civil, MD;  Location: [facility];  Service: Orthopedics;  Laterality: Left;  . THYROID SURGERY  2002    There were no vitals filed for this visit.   Subjective Assessment - 09/17/20 0825    Currently in Pain? Yes    Pain Score 4     Pain Location Shoulder    Pain Orientation Left    Pain Descriptors / Indicators Sore    Pain Type Acute pain    Pain Onset In the past 7 days    Pain Frequency Occasional    Aggravating Factors  increased use    Pain Relieving Factors pain medication and ice as needed.    Effect of Pain on Daily Activities mod effect    Multiple Pain Sites No              [facility] OT Assessment - 09/17/20 0837      Assessment   Medical Diagnosis Left RTC repair      Precautions   Precautions Shoulder;Other (comment)    Type of Shoulder Precautions Standard RCR protocol   Sling on 4-6 weeks   0-6 weeks (3/18-4/29)-: PROM, pendulum  6-12 weeks (4/29-6/10): AAROM progressing to AROM   12-24 weeks: Strengthening exercises    Precaution Comments History of Plasmocytoma.                    OT Treatments/Exercises (OP) - 09/17/20 0836      Exercises   Exercises Shoulder      Shoulder Exercises: Supine   Protraction PROM;5 reps    Horizontal ABduction PROM;5 reps    External Rotation PROM;5 reps    Internal Rotation PROM;5 reps    Flexion PROM;5 reps    ABduction PROM;5 reps      Shoulder Exercises: Standing   Protraction AROM;12 reps    Horizontal ABduction AROM;12 reps    External Rotation AROM;12 reps    Internal Rotation AROM;12 reps    Flexion AROM;12 reps    ABduction AROM;12 reps      Shoulder Exercises: ROM/Strengthening   UBE (Upper Arm Bike) level 1 2' forward 2' reverse   pace: 5.0   Over  Head Lace seated. Pt laced chain starting at top then removed  unlaced once reaching the bottom.    Proximal Shoulder Strengthening, Seated 10X each, no rest breaks    Ball on Wall 1' flexion 1' abduction green ball      Manual Therapy   Manual Therapy Myofascial release    Manual therapy comments manual therapy completed seperately from all other interventions this date    Myofascial Release myofascial release and manual stretching to left upper arm, trapezius, and scapular regions to decrease pain and fascial restrictions and increase joint ROM                    OT Short Term Goals - 09/17/20 0839      OT SHORT TERM GOAL #1   Title Patient will be educated and independent with HEP in order to facilitate her progress in therapy and begin to use her LUE as her non dominant extremity for 50% or more of daily tasks.    Time 6    Period Weeks    Status On-going    Target Date 08/26/20      OT SHORT TERM GOAL #2   Title patient will increase her LUE P/ROM to [facility] in order to increase ability to complete upper body dressing tasks.    Time 6    Period Weeks      OT SHORT TERM GOAL #3   Title Patient will increase her LUE strength to 3/5 in order to complete functional waist level self care tasks with less difficulty.    Time 6    Period Weeks      OT SHORT TERM GOAL #4   Title Patient will report a decrease in pain for her LUE of approximately 6/10 or less while completing dressing and bathing tasks.    Time 6    Period Weeks      OT SHORT TERM GOAL #5   Title Patient will decrease LUE fascial restrictions to moderate amount in order to increase her functional mobility needed to complete low level reaching tasks.    Time 6    Period Weeks             OT Long Term Goals - 09/09/20 0806      OT LONG TERM GOAL #1   Title Patient will increase her LUE A/ROM to [facility] in order to complete reaching tasks at or above her shoulder level.    Time  12    Period Weeks     Status On-going      OT LONG TERM GOAL #2   Title Patient will increase her LUE strength to 4/5 or better in order to return to household lifting tasks while using her LUE.    Time 12    Period Weeks    Status On-going      OT LONG TERM GOAL #3   Title Patient will report a decrease in pain level in her LUE to 3/10 or less while completing daily tasks.    Time 12    Period Weeks    Status On-going      OT LONG TERM GOAL #4   Title Patient will decrease her LUE fascial restrictions to min amount or less in order to increase the functional mobility needed to complete reaching tasks.    Time 12    Period Weeks    Status On-going                 Plan - 09/17/20 0900    Clinical Impression Statement A: Manual techniques completed to address min fascial restrictions located in the upper trapezius region. Patient completed ball on the wall for scapular and shoulder stability. UBE bike was completed for shoulder strength and endurance. VC for form and technique were provided. patient demonstrating close to full range of motion with active and passive ROM.    Body Structure / Function / Physical Skills ADL;UE functional use;Fascial restriction;Pain;ROM;Decreased knowledge of precautions;Strength;Mobility    Plan P: Continue with A/ROM. Continue with scapular strengthening.    Consulted and Agree with Plan of Care Patient           Patient will benefit from skilled therapeutic intervention in order to improve the following deficits and impairments:   Body Structure / Function / Physical Skills: ADL,UE functional use,Fascial restriction,Pain,ROM,Decreased knowledge of precautions,Strength,Mobility       Visit Diagnosis: Stiffness of left shoulder, not elsewhere classified  Acute pain of left shoulder  Other symptoms and signs involving the musculoskeletal system    Problem List Patient Active Problem List   Diagnosis Date Noted  . S/p left rotator cuff repair distal  clavicle resection 07/03/20  07/07/2020  . Complete tear of left rotator cuff   . 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[place]) 07/10/2019  . Plasma cell disorder 06/10/2019  . Lesion of bone of lumbosacral spine 05/28/2019  . Left-sided weakness 03/13/2016  . Cerebrovascular accident (CVA) ([place])   . Palpitations   . COPD (chronic obstructive pulmonary disease) ([place]) 08/20/2014  . Dyspnea 12/19/2013  . Chronic diastolic CHF (congestive heart failure) ([place]) 07/05/2013  . Lower extremity edema 07/05/2013  . Hyperlipidemia   . Chest pain 07/04/2013  . Hypokalemia 09/04/2012  . Precordial pain 09/04/2012  . Viral meningitis 01/22/2011  . Vomiting 01/22/2011  . PNEUMONIA, LEFT LOWER LOBE 10/10/2006  . DISEASE, ACUTE BRONCHOSPASM 10/10/2006  . Essential hypertension 05/23/2006  . OSTEOARTHRITIS 05/23/2006    Ailene Ravel, OTR/L,CBIS  [phone]  09/17/2020, 9:27 AM  [facility] [address] [place], [place], [address] Phone: [phone]   Fax:  [phone]  Name: YULIZA CARA MRN: [id number] Date of Birth: [birth year]

## 2020-09-22 ENCOUNTER — Encounter (HOSPITAL_COMMUNITY): Payer: Self-pay | Admitting: Occupational Therapy

## 2020-09-22 ENCOUNTER — Other Ambulatory Visit: Payer: Self-pay

## 2020-09-22 ENCOUNTER — Ambulatory Visit (HOSPITAL_COMMUNITY): Payer: PRIVATE HEALTH INSURANCE | Admitting: Occupational Therapy

## 2020-09-22 DIAGNOSIS — R29898 Other symptoms and signs involving the musculoskeletal system: Secondary | ICD-10-CM

## 2020-09-22 DIAGNOSIS — M25612 Stiffness of left shoulder, not elsewhere classified: Secondary | ICD-10-CM | POA: Diagnosis not present

## 2020-09-22 DIAGNOSIS — M25512 Pain in left shoulder: Secondary | ICD-10-CM

## 2020-09-22 NOTE — Therapy (Signed)
[facility] [place], [place], [address] Phone: [phone]   Fax:  [phone]  Occupational Therapy Treatment  Patient Details  Name: Hannah Cooper MRN: [id number] Date of Birth: [birth year] Referring Provider (OT): Arther Abbott, MD   Encounter Date: 09/22/2020   OT End of Session - 09/22/20 0813    Visit Number 20    Number of Visits 24    Date for OT Re-Evaluation 10/07/20    Authorization Type 1) [employer] 2) [employer]    Authorization Time Period no copay. no visit limit Fax initial evaluation for medical necessity.    Progress Note Due on Visit 27    OT Start Time [phone]    OT Stop Time 0813    OT Time Calculation (min) 38 min    Activity Tolerance Patient tolerated treatment well    Behavior During Therapy WFL for tasks assessed/performed           Past Medical History:  Diagnosis Date  . Arthritis   . Atypical chest pain    chronic  . Cancer ([place])    bone cancer  . Chronic back pain   . Chronic diastolic CHF (congestive heart failure) ([place])   . COPD (chronic obstructive pulmonary disease) ([place])   . DDD (degenerative disc disease), cervical   . History of radiation therapy 07/16/19-08/22/19   L4 spine ; Dr. Gery Pray  . Hyperlipidemia   . Hypertension   . Lumbar radiculopathy   . Meningitis   . Normal coronary arteries 2014  . Palpitations   . Premature atrial contractions   . PVC's (premature ventricular contractions)     Past Surgical History:  Procedure Laterality Date  . ABDOMINAL HYSTERECTOMY    . IR FLUORO GUIDED NEEDLE PLC ASPIRATION/INJECTION LOC  06/19/2019  . LEFT HEART CATHETERIZATION WITH CORONARY ANGIOGRAM N/A 09/05/2012   Procedure: LEFT HEART CATHETERIZATION WITH CORONARY ANGIOGRAM;  Surgeon: Wellington Hampshire, MD;  Location: [facility];  Service: Cardiovascular;  Laterality: N/A;  . RESECTION DISTAL CLAVICAL Left 07/03/2020   Procedure: RESECTION DISTAL  CLAVICAL;  Surgeon: Carole Civil, MD;  Location: [facility];  Service: Orthopedics;  Laterality: Left;  . SHOULDER OPEN ROTATOR CUFF REPAIR Left 07/03/2020   Procedure: ROTATOR CUFF REPAIR SHOULDER OPEN WITH CHROMEOPLASTY;  Surgeon: Carole Civil, MD;  Location: [facility];  Service: Orthopedics;  Laterality: Left;  . THYROID SURGERY  2002    There were no vitals filed for this visit.   Subjective Assessment - 09/22/20 0736    Subjective  S: It's just a little sore today.    Currently in Pain? Yes    Pain Score 3     Pain Location Shoulder    Pain Orientation Left    Pain Descriptors / Indicators Sore    Pain Type Acute pain    Pain Radiating Towards down arm    Pain Onset In the past 7 days    Pain Frequency Occasional    Aggravating Factors  increased use    Pain Relieving Factors pain medication and ice as needed    Effect of Pain on Daily Activities mod effect on ADLs    Multiple Pain Sites No              [facility] OT Assessment - 09/22/20 0735      Assessment   Medical Diagnosis Left RTC repair      Precautions   Precautions Shoulder;Other (comment)  Type of Shoulder Precautions Standard RCR protocol   Sling on 4-6 weeks   0-6 weeks (3/18-4/29)-: PROM, pendulum   6-12 weeks (4/29-6/10): AAROM progressing to AROM   12-24 weeks: Strengthening exercises    Precaution Comments History of Plasmocytoma.                    OT Treatments/Exercises (OP) - 09/22/20 0737      Exercises   Exercises Shoulder      Shoulder Exercises: Supine   Protraction PROM;5 reps    Horizontal ABduction PROM;5 reps    External Rotation PROM;5 reps    Internal Rotation PROM;5 reps    Flexion PROM;5 reps    ABduction PROM;5 reps      Shoulder Exercises: Standing   Protraction AROM;12 reps    Horizontal ABduction AROM;12 reps    External Rotation AROM;12 reps    Internal Rotation AROM;12 reps    Flexion AROM;12 reps    ABduction AROM;12 reps    Extension Theraband;10  reps    Theraband Level (Shoulder Extension) Level 2 (Red)    Row Theraband;10 reps    Theraband Level (Shoulder Row) Level 2 (Red)    Retraction Theraband;10 reps    Theraband Level (Shoulder Retraction) Level 2 (Red)      Shoulder Exercises: ROM/Strengthening   Over Head Lace 2' seated    Proximal Shoulder Strengthening, Seated 10X each, no rest breaks      Shoulder Exercises: Stretch   Internal Rotation Stretch 2 reps   10" horizontal towel   External Rotation Stretch 2 reps;10 seconds    Wall Stretch - Flexion 2 reps;10 seconds    Wall Stretch - ABduction 2 reps;10 seconds    Other Shoulder Stretches doorway stretch, 10", 2X      Manual Therapy   Manual Therapy Myofascial release    Manual therapy comments manual therapy completed seperately from all other interventions this date    Myofascial Release myofascial release and manual stretching to left upper arm, trapezius, and scapular regions to decrease pain and fascial restrictions and increase joint ROM                    OT Short Term Goals - 09/17/20 6644      OT SHORT TERM GOAL #1   Title Patient will be educated and independent with HEP in order to facilitate her progress in therapy and begin to use her LUE as her non dominant extremity for 50% or more of daily tasks.    Time 6    Period Weeks    Status On-going    Target Date 08/26/20      OT SHORT TERM GOAL #2   Title patient will increase her LUE P/ROM to [facility] in order to increase ability to complete upper body dressing tasks.    Time 6    Period Weeks      OT SHORT TERM GOAL #3   Title Patient will increase her LUE strength to 3/5 in order to complete functional waist level self care tasks with less difficulty.    Time 6    Period Weeks      OT SHORT TERM GOAL #4   Title Patient will report a decrease in pain for her LUE of approximately 6/10 or less while completing dressing and bathing tasks.    Time 6    Period Weeks      OT SHORT TERM GOAL  #5   Title Patient  will decrease LUE fascial restrictions to moderate amount in order to increase her functional mobility needed to complete low level reaching tasks.    Time 6    Period Weeks             OT Long Term Goals - 09/09/20 0806      OT LONG TERM GOAL #1   Title Patient will increase her LUE A/ROM to [facility] in order to complete reaching tasks at or above her shoulder level.    Time 12    Period Weeks    Status On-going      OT LONG TERM GOAL #2   Title Patient will increase her LUE strength to 4/5 or better in order to return to household lifting tasks while using her LUE.    Time 12    Period Weeks    Status On-going      OT LONG TERM GOAL #3   Title Patient will report a decrease in pain level in her LUE to 3/10 or less while completing daily tasks.    Time 12    Period Weeks    Status On-going      OT LONG TERM GOAL #4   Title Patient will decrease her LUE fascial restrictions to min amount or less in order to increase the functional mobility needed to complete reaching tasks.    Time 12    Period Weeks    Status On-going                 Plan - 09/22/20 0813    Clinical Impression Statement A: Myofascial release to LUE to address fascial restrictions, continued with passive stretching and A/ROM. Added shoulder stretches to improve end ROM, pt with ROM WNL during sustained stretches. Continued with scapular theraband and overhead lacing. Verbal cuing for form and technique.    Body Structure / Function / Physical Skills ADL;UE functional use;Fascial restriction;Pain;ROM;Decreased knowledge of precautions;Strength;Mobility    Plan P: Continue with shoulder stretches and scapular strengthening and add to HEP    OT Home Exercise Plan eval: A/ROM elbow, wrist, forearm, seated A/ROM scapular exercises; 4/6: pendulums; 4/15: table slides 4/27: AA/ROM er supine 5/5: AA/ROm supine except abduction 5/10: standing AA/ROM protraction, abduction, flexion 5/12: AA/ROM  standing (all); 5/25: A/ROM    Consulted and Agree with Plan of Care Patient           Patient will benefit from skilled therapeutic intervention in order to improve the following deficits and impairments:   Body Structure / Function / Physical Skills: ADL,UE functional use,Fascial restriction,Pain,ROM,Decreased knowledge of precautions,Strength,Mobility       Visit Diagnosis: Stiffness of left shoulder, not elsewhere classified  Acute pain of left shoulder  Other symptoms and signs involving the musculoskeletal system    Problem List Patient Active Problem List   Diagnosis Date Noted  . S/p left rotator cuff repair distal clavicle resection 07/03/20  07/07/2020  . Complete tear of left rotator cuff   . 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 Plasma cell disorder 06/10/2019  . Lesion of bone of lumbosacral spine 05/28/2019  . Left-sided weakness 03/13/2016  . Cerebrovascular accident (CVA) ([place])   . Palpitations   . COPD (chronic obstructive pulmonary disease) ([place]) 08/20/2014  . Dyspnea 12/19/2013  . Chronic diastolic CHF (congestive heart failure) ([place]) 07/05/2013  . Lower extremity edema 07/05/2013  . Hyperlipidemia   . Chest pain 07/04/2013  . Hypokalemia 09/04/2012  . Precordial pain 09/04/2012  .  Viral meningitis 01/22/2011  . Vomiting 01/22/2011  . PNEUMONIA, LEFT LOWER LOBE 10/10/2006  . DISEASE, ACUTE BRONCHOSPASM 10/10/2006  . Essential hypertension 05/23/2006  . OSTEOARTHRITIS 05/23/2006   Guadelupe Sabin, OTR/L  [phone] 09/22/2020, 8:14 AM  [place], [place], [address] Phone: [phone]   Fax:  [phone]  Name: KENDELLE SCHWEERS MRN: [id number] Date of Birth: [birth year]

## 2020-09-24 ENCOUNTER — Ambulatory Visit (HOSPITAL_COMMUNITY): Payer: PRIVATE HEALTH INSURANCE | Admitting: Occupational Therapy

## 2020-09-24 ENCOUNTER — Other Ambulatory Visit: Payer: Self-pay

## 2020-09-24 ENCOUNTER — Encounter (HOSPITAL_COMMUNITY): Payer: Self-pay | Admitting: Occupational Therapy

## 2020-09-24 DIAGNOSIS — M25612 Stiffness of left shoulder, not elsewhere classified: Secondary | ICD-10-CM | POA: Diagnosis not present

## 2020-09-24 DIAGNOSIS — R29898 Other symptoms and signs involving the musculoskeletal system: Secondary | ICD-10-CM

## 2020-09-24 DIAGNOSIS — M25512 Pain in left shoulder: Secondary | ICD-10-CM

## 2020-09-24 NOTE — Patient Instructions (Signed)
  1) Flexion Wall Stretch    Face wall, place affected handon wall in front of you. Slide hand up the wall  and lean body in towards the wall. Hold for 10 seconds. Repeat 3-5 times. 1-2 times/day.     2) Towel Stretch with Internal Rotation   Or     Gently pull up (or to the side) your affected arm  behind your back with the assist of a towel. Hold 10 seconds, repeat 3-5 times. 1-2 times/day.             3) Corner Stretch    Stand at a corner of a wall, place your arms on the walls with elbows bent. Lean into the corner until a stretch is felt along the front of your chest and/or shoulders. Hold for 10 seconds. Repeat 3-5X, 1-2 times/day.    4) Posterior Capsule Stretch    Bring the involved arm across chest. Grasp elbow and pull toward chest until you feel a stretch in the back of the upper arm and shoulder. Hold 10 seconds. Repeat 3-5X. Complete 1-2 times/day.    5) Scapular Retraction    Tuck chin back as you pinch shoulder blades together.  Hold 5 seconds. Repeat 3-5X. Complete 1-2 times/day.    6) External Rotation Stretch:     Place your affected hand on the wall with the elbow bent and gently turn your body the opposite direction until a stretch is felt. Hold 10 seconds, repeat 3-5X. Complete 1-2 times/day.     Theraband Execises:   1) (Home) Extension: Isometric / Bilateral Arm Retraction - Sitting   Facing anchor, hold hands and elbow at shoulder height, with elbow bent.  Pull arms back to squeeze shoulder blades together. Repeat 10-15 times. 1-3 times/day.   2) (Clinic) Extension / Flexion (Assist)   Face anchor, pull arms back, keeping elbow straight, and squeze shoulder blades together. Repeat 10-15 times. 1-3 times/day.   Copyright  VHI. All rights reserved.   3) (Home) Retraction: Row - Bilateral (Anchor)   Facing anchor, arms reaching forward, pull hands toward stomach, keeping elbows bent and at your sides and pinching shoulder  blades together. Repeat 10-15 times. 1-3 times/day.   Copyright  VHI. All rights reserved.

## 2020-09-24 NOTE — Therapy (Signed)
[place], [place], [address] Phone: [phone]   Fax:  [phone]  Occupational Therapy Treatment  Patient Details  Name: Hannah Cooper MRN: [id number] Date of Birth: 1955/10/16 Referring Provider (OT): Arther Abbott, MD   Encounter Date: 09/24/2020   OT End of Session - 09/24/20 0902     Visit Number 21    Number of Visits 24    Date for OT Re-Evaluation 10/07/20    Authorization Type 1) [employer] 2) [employer]    Authorization Time Period no copay. no visit limit Fax initial evaluation for medical necessity.    Progress Note Due on Visit 70    OT Start Time 0820    OT Stop Time 0859    OT Time Calculation (min) 39 min    Activity Tolerance Patient tolerated treatment well    Behavior During Therapy WFL for tasks assessed/performed             Past Medical History:  Diagnosis Date   Arthritis    Atypical chest pain    chronic   Cancer ([facility])    bone cancer   Chronic back pain    Chronic diastolic CHF (congestive heart failure) ([facility])    COPD (chronic obstructive pulmonary disease) ([facility])    DDD (degenerative disc disease), cervical    History of radiation therapy 07/16/19-08/22/19   L4 spine ; Dr. Gery Pray   Hyperlipidemia    Hypertension    Lumbar radiculopathy    Meningitis    Normal coronary arteries 2014   Palpitations    Premature atrial contractions    PVC's (premature ventricular contractions)     Past Surgical History:  Procedure Laterality Date   ABDOMINAL HYSTERECTOMY     IR FLUORO GUIDED NEEDLE PLC ASPIRATION/INJECTION LOC  06/19/2019   LEFT HEART CATHETERIZATION WITH CORONARY ANGIOGRAM N/A 09/05/2012   Procedure: LEFT HEART CATHETERIZATION WITH CORONARY ANGIOGRAM;  Surgeon: Wellington Hampshire, MD;  Location: [facility] CATH [facility];  Service: Cardiovascular;  Laterality: N/A;   RESECTION DISTAL CLAVICAL Left 07/03/2020   Procedure: RESECTION DISTAL CLAVICAL;  Surgeon:  Carole Civil, MD;  Location: [facility];  Service: Orthopedics;  Laterality: Left;   SHOULDER OPEN ROTATOR CUFF REPAIR Left 07/03/2020   Procedure: ROTATOR CUFF REPAIR SHOULDER OPEN WITH CHROMEOPLASTY;  Surgeon: Carole Civil, MD;  Location: [facility];  Service: Orthopedics;  Laterality: Left;   THYROID SURGERY  2002    There were no vitals filed for this visit.   Subjective Assessment - 09/24/20 0822     Subjective  S: It feels good.    Currently in Pain? No/denies                [facility] OT Assessment - 09/24/20 0175       Assessment   Medical Diagnosis Left RTC repair      Precautions   Precautions Shoulder;Other (comment)    Type of Shoulder Precautions Standard RCR protocol   Sling on 4-6 weeks   0-6 weeks (3/18-4/29)-: PROM, pendulum   6-12 weeks (4/29-6/10): AAROM progressing to AROM   12-24 weeks: Strengthening exercises    Precaution Comments History of Plasmocytoma.                      OT Treatments/Exercises (OP) - 09/24/20 1025       Exercises   Exercises Shoulder      Shoulder Exercises: Supine   Protraction PROM;5 reps  Horizontal ABduction PROM;5 reps    External Rotation PROM;5 reps    Internal Rotation PROM;5 reps    Flexion PROM;5 reps    ABduction PROM;5 reps      Shoulder Exercises: Standing   Protraction AROM;12 reps    Horizontal ABduction AROM;12 reps    External Rotation AROM;12 reps    Internal Rotation AROM;12 reps    Flexion AROM;12 reps    ABduction AROM;12 reps    Extension Theraband;10 reps    Theraband Level (Shoulder Extension) Level 2 (Red)    Row Theraband;10 reps    Theraband Level (Shoulder Row) Level 2 (Red)    Retraction Theraband;10 reps    Theraband Level (Shoulder Retraction) Level 2 (Red)      Shoulder Exercises: ROM/Strengthening   UBE (Upper Arm Bike) level 1 2' forward 2' reverse, pace: 5.0    Proximal Shoulder Strengthening, Supine 10X each, no rest breaks    Proximal Shoulder Strengthening,  Seated 10X each, no rest breaks    Ball on Wall 1' flexion 1' abduction red ball      Shoulder Exercises: Stretch   Internal Rotation Stretch 2 reps   10" vertical towel   External Rotation Stretch 2 reps;10 seconds    Wall Stretch - Flexion 2 reps;10 seconds    Wall Stretch - ABduction 2 reps;10 seconds    Other Shoulder Stretches doorway stretch, 10", 2X      Functional Reaching Activities   High Level Pt removing items from top shelf of kitchen cabinet, moving items on middle shelf to top shelf, then placing original items on middle shelf.      Manual Therapy   Manual Therapy Myofascial release    Manual therapy comments manual therapy completed seperately from all other interventions this date    Myofascial Release myofascial release and manual stretching to left upper arm, trapezius, and scapular regions to decrease pain and fascial restrictions and increase joint ROM                    OT Education - 09/24/20 0836     Education provided Yes    Education Details red scapular theraband, shoulder stretches    Person(s) Educated Patient    Methods Explanation;Demonstration;Verbal cues;Handout    Comprehension Verbalized understanding;Returned demonstration              OT Short Term Goals - 09/17/20 0839       OT SHORT TERM GOAL #1   Title Patient will be educated and independent with HEP in order to facilitate her progress in therapy and begin to use her LUE as her non dominant extremity for 50% or more of daily tasks.    Time 6    Period Weeks    Status On-going    Target Date 08/26/20      OT SHORT TERM GOAL #2   Title patient will increase her LUE P/ROM to [facility] in order to increase ability to complete upper body dressing tasks.    Time 6    Period Weeks      OT SHORT TERM GOAL #3   Title Patient will increase her LUE strength to 3/5 in order to complete functional waist level self care tasks with less difficulty.    Time 6    Period Weeks      OT  SHORT TERM GOAL #4   Title Patient will report a decrease in pain for her LUE of approximately 6/10 or less while completing  dressing and bathing tasks.    Time 6    Period Weeks      OT SHORT TERM GOAL #5   Title Patient will decrease LUE fascial restrictions to moderate amount in order to increase her functional mobility needed to complete low level reaching tasks.    Time 6    Period Weeks               OT Long Term Goals - 09/09/20 0806       OT LONG TERM GOAL #1   Title Patient will increase her LUE A/ROM to [facility] in order to complete reaching tasks at or above her shoulder level.    Time 12    Period Weeks    Status On-going      OT LONG TERM GOAL #2   Title Patient will increase her LUE strength to 4/5 or better in order to return to household lifting tasks while using her LUE.    Time 12    Period Weeks    Status On-going      OT LONG TERM GOAL #3   Title Patient will report a decrease in pain level in her LUE to 3/10 or less while completing daily tasks.    Time 12    Period Weeks    Status On-going      OT LONG TERM GOAL #4   Title Patient will decrease her LUE fascial restrictions to min amount or less in order to increase the functional mobility needed to complete reaching tasks.    Time 12    Period Weeks    Status On-going                   Plan - 09/24/20 0903     Clinical Impression Statement A: Pt with min fascial restrictions in upper arm and trapezius, myofascial release completed to address. Continued with passive stretching, A/ROM, and shoulder stretches today. Updated HEP for shoulder stretches and scapular theraband. Completed functional reaching and scapular strengthening tasks. Min fatigue noted during session. Verbal cuing for form and technique.    Body Structure / Function / Physical Skills ADL;UE functional use;Fascial restriction;Pain;ROM;Decreased knowledge of precautions;Strength;Mobility    Plan P: Follow up on HEP, add green  therapy ball strengthening    OT Home Exercise Plan eval: A/ROM elbow, wrist, forearm, seated A/ROM scapular exercises; 4/6: pendulums; 4/15: table slides 4/27: AA/ROM er supine 5/5: AA/ROm supine except abduction 5/10: standing AA/ROM protraction, abduction, flexion 5/12: AA/ROM standing (all); 5/25: A/ROM; 6/9: red scapular theraband, shoulder stretches    Consulted and Agree with Plan of Care Patient             Patient will benefit from skilled therapeutic intervention in order to improve the following deficits and impairments:   Body Structure / Function / Physical Skills: ADL, UE functional use, Fascial restriction, Pain, ROM, Decreased knowledge of precautions, Strength, Mobility       Visit Diagnosis: Stiffness of left shoulder, not elsewhere classified  Acute pain of left shoulder  Other symptoms and signs involving the musculoskeletal system    Problem List Patient Active Problem List   Diagnosis Date Noted   S/p left rotator cuff repair distal clavicle resection 07/03/20  07/07/2020   Complete tear of left rotator cuff    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[place]) 07/10/2019   Plasma cell disorder 06/10/2019   Lesion of bone of lumbosacral spine 05/28/2019   Left-sided weakness 03/13/2016   Cerebrovascular accident (  CVA) ([place])    Palpitations    COPD (chronic obstructive pulmonary disease) ([place]) 08/20/2014   Dyspnea 12/19/2013   Chronic diastolic CHF (congestive heart failure) ([place]) 07/05/2013   Lower extremity edema 07/05/2013   Hyperlipidemia    Chest pain 07/04/2013   Hypokalemia 09/04/2012   Precordial pain 09/04/2012   Viral meningitis 01/22/2011   Vomiting 01/22/2011   PNEUMONIA, LEFT LOWER LOBE 10/10/2006   DISEASE, ACUTE BRONCHOSPASM 10/10/2006   Essential hypertension 05/23/2006   OSTEOARTHRITIS 05/23/2006   Guadelupe Sabin, OTR/L  [phone] 09/24/2020, 9:05 AM  [place], [place], [address] Phone: [phone]   Fax:  [phone]  Name: Hannah Cooper MRN: [id number] Date of Birth: 05-19-1955

## 2020-09-28 ENCOUNTER — Ambulatory Visit (HOSPITAL_COMMUNITY): Payer: HMO

## 2020-09-29 ENCOUNTER — Other Ambulatory Visit: Payer: Self-pay

## 2020-09-29 ENCOUNTER — Encounter (HOSPITAL_COMMUNITY): Payer: Self-pay | Admitting: Occupational Therapy

## 2020-09-29 ENCOUNTER — Ambulatory Visit (HOSPITAL_COMMUNITY): Payer: PRIVATE HEALTH INSURANCE | Admitting: Occupational Therapy

## 2020-09-29 DIAGNOSIS — M25612 Stiffness of left shoulder, not elsewhere classified: Secondary | ICD-10-CM | POA: Diagnosis not present

## 2020-09-29 DIAGNOSIS — R29898 Other symptoms and signs involving the musculoskeletal system: Secondary | ICD-10-CM

## 2020-09-29 DIAGNOSIS — M25512 Pain in left shoulder: Secondary | ICD-10-CM

## 2020-09-29 NOTE — Therapy (Signed)
[place], [place], [address] Phone: [phone]   Fax:  [phone]  Occupational Therapy Treatment  Patient Details  Name: Hannah Cooper MRN: [id number] Date of Birth: Sep 24, 1955 Referring Provider (OT): Arther Abbott, MD   Encounter Date: 09/29/2020   OT End of Session - 09/29/20 0801     Visit Number 22    Number of Visits 24    Date for OT Re-Evaluation 10/07/20    Authorization Type 1) [employer] 2) [employer]    Authorization Time Period no copay. no visit limit Fax initial evaluation for medical necessity.    Progress Note Due on Visit 18    OT Start Time [phone]   pt requesting to leave at 8:00   OT Stop Time 0800    OT Time Calculation (min) 28 min    Activity Tolerance Patient tolerated treatment well    Behavior During Therapy WFL for tasks assessed/performed             Past Medical History:  Diagnosis Date   Arthritis    Atypical chest pain    chronic   Cancer ([place])    bone cancer   Chronic back pain    Chronic diastolic CHF (congestive heart failure) (HCC)    COPD (chronic obstructive pulmonary disease) (HCC)    DDD (degenerative disc disease), cervical    History of radiation therapy 07/16/19-08/22/19   L4 spine ; Dr. Gery Pray   Hyperlipidemia    Hypertension    Lumbar radiculopathy    Meningitis    Normal coronary arteries 2014   Palpitations    Premature atrial contractions    PVC's (premature ventricular contractions)     Past Surgical History:  Procedure Laterality Date   ABDOMINAL HYSTERECTOMY     IR FLUORO GUIDED NEEDLE PLC ASPIRATION/INJECTION LOC  06/19/2019   LEFT HEART CATHETERIZATION WITH CORONARY ANGIOGRAM N/A 09/05/2012   Procedure: LEFT HEART CATHETERIZATION WITH CORONARY ANGIOGRAM;  Surgeon: Wellington Hampshire, MD;  Location: [facility];  Service: Cardiovascular;  Laterality: N/A;   RESECTION DISTAL CLAVICAL Left 07/03/2020   Procedure:  RESECTION DISTAL CLAVICAL;  Surgeon: Carole Civil, MD;  Location: [facility];  Service: Orthopedics;  Laterality: Left;   SHOULDER OPEN ROTATOR CUFF REPAIR Left 07/03/2020   Procedure: ROTATOR CUFF REPAIR SHOULDER OPEN WITH CHROMEOPLASTY;  Surgeon: Carole Civil, MD;  Location: [facility];  Service: Orthopedics;  Laterality: Left;   THYROID SURGERY  2002    There were no vitals filed for this visit.   Subjective Assessment - 09/29/20 0733     Subjective  S: I've been doing ok I think.    Currently in Pain? No/denies                [facility] OT Assessment - 09/29/20 0732       Assessment   Medical Diagnosis Left RTC repair      Precautions   Precautions Shoulder;Other (comment)    Type of Shoulder Precautions Standard RCR protocol   Sling on 4-6 weeks   0-6 weeks (3/18-4/29)-: PROM, pendulum   6-12 weeks (4/29-6/10): AAROM progressing to AROM   12-24 weeks: Strengthening exercises    Precaution Comments History of Plasmocytoma.                      OT Treatments/Exercises (OP) - 09/29/20 0740       Exercises   Exercises Shoulder  Shoulder Exercises: Supine   Protraction PROM;5 reps    Horizontal ABduction PROM;5 reps    External Rotation PROM;5 reps    Internal Rotation PROM;5 reps    Flexion PROM;5 reps    ABduction PROM;5 reps      Shoulder Exercises: Standing   Protraction AROM;12 reps    Horizontal ABduction AROM;12 reps    External Rotation AROM;12 reps    Internal Rotation AROM;12 reps    Flexion AROM;12 reps    ABduction AROM;12 reps    Extension Theraband;10 reps    Theraband Level (Shoulder Extension) Level 2 (Red)    Row Theraband;10 reps    Theraband Level (Shoulder Row) Level 2 (Red)    Retraction Theraband;10 reps    Theraband Level (Shoulder Retraction) Level 2 (Red)      Shoulder Exercises: Therapy Ball   Other Therapy Ball Exercises green therapy ball: chest press, flexion, circles each direction, 10X      Shoulder  Exercises: ROM/Strengthening   Proximal Shoulder Strengthening, Seated 12X each, no rest breaks      Shoulder Exercises: Stretch   Internal Rotation Stretch 2 reps   20" vertical towel   Wall Stretch - Flexion 2 reps;20 seconds    Wall Stretch - ABduction 2 reps;20 seconds                      OT Short Term Goals - 09/17/20 0839       OT SHORT TERM GOAL #1   Title Patient will be educated and independent with HEP in order to facilitate her progress in therapy and begin to use her LUE as her non dominant extremity for 50% or more of daily tasks.    Time 6    Period Weeks    Status On-going    Target Date 08/26/20      OT SHORT TERM GOAL #2   Title patient will increase her LUE P/ROM to [facility] in order to increase ability to complete upper body dressing tasks.    Time 6    Period Weeks      OT SHORT TERM GOAL #3   Title Patient will increase her LUE strength to 3/5 in order to complete functional waist level self care tasks with less difficulty.    Time 6    Period Weeks      OT SHORT TERM GOAL #4   Title Patient will report a decrease in pain for her LUE of approximately 6/10 or less while completing dressing and bathing tasks.    Time 6    Period Weeks      OT SHORT TERM GOAL #5   Title Patient will decrease LUE fascial restrictions to moderate amount in order to increase her functional mobility needed to complete low level reaching tasks.    Time 6    Period Weeks               OT Long Term Goals - 09/09/20 0806       OT LONG TERM GOAL #1   Title Patient will increase her LUE A/ROM to [facility] in order to complete reaching tasks at or above her shoulder level.    Time 12    Period Weeks    Status On-going      OT LONG TERM GOAL #2   Title Patient will increase her LUE strength to 4/5 or better in order to return to household lifting tasks while using her LUE.  Time 12    Period Weeks    Status On-going      OT LONG TERM GOAL #3   Title Patient  will report a decrease in pain level in her LUE to 3/10 or less while completing daily tasks.    Time 12    Period Weeks    Status On-going      OT LONG TERM GOAL #4   Title Patient will decrease her LUE fascial restrictions to min amount or less in order to increase the functional mobility needed to complete reaching tasks.    Time 12    Period Weeks    Status On-going                   Plan - 09/29/20 0801     Clinical Impression Statement A: Pt requesting to leave early today due to another appointment she needed to attend. Continued with passive stretching and standing shoulder stretches, A/ROM in standing. Added therapy ball strengthening and continued with scapular strengthening exercises. Verbal cuing for form and technique.    Body Structure / Function / Physical Skills ADL;UE functional use;Fascial restriction;Pain;ROM;Decreased knowledge of precautions;Strength;Mobility    Plan P: Progress to standing strengthening with low weight, continue with therapy ball exercises and resume ball on wall    OT Home Exercise Plan eval: A/ROM elbow, wrist, forearm, seated A/ROM scapular exercises; 4/6: pendulums; 4/15: table slides 4/27: AA/ROM er supine 5/5: AA/ROm supine except abduction 5/10: standing AA/ROM protraction, abduction, flexion 5/12: AA/ROM standing (all); 5/25: A/ROM; 6/9: red scapular theraband, shoulder stretches    Consulted and Agree with Plan of Care Patient             Patient will benefit from skilled therapeutic intervention in order to improve the following deficits and impairments:   Body Structure / Function / Physical Skills: ADL, UE functional use, Fascial restriction, Pain, ROM, Decreased knowledge of precautions, Strength, Mobility       Visit Diagnosis: Stiffness of left shoulder, not elsewhere classified  Acute pain of left shoulder  Other symptoms and signs involving the musculoskeletal system    Problem List Patient Active Problem List    Diagnosis Date Noted   S/p left rotator cuff repair distal clavicle resection 07/03/20  07/07/2020   Complete tear of left rotator cuff    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[place]) 07/10/2019   Plasma cell disorder 06/10/2019   Lesion of bone of lumbosacral spine 05/28/2019   Left-sided weakness 03/13/2016   Cerebrovascular accident (CVA) ([place])    Palpitations    COPD (chronic obstructive pulmonary disease) ([place]) 08/20/2014   Dyspnea 12/19/2013   Chronic diastolic CHF (congestive heart failure) ([place]) 07/05/2013   Lower extremity edema 07/05/2013   Hyperlipidemia    Chest pain 07/04/2013   Hypokalemia 09/04/2012   Precordial pain 09/04/2012   Viral meningitis 01/22/2011   Vomiting 01/22/2011   PNEUMONIA, LEFT LOWER LOBE 10/10/2006   DISEASE, ACUTE BRONCHOSPASM 10/10/2006   Essential hypertension 05/23/2006   OSTEOARTHRITIS 05/23/2006    Guadelupe Sabin, OTR/L  [phone] 09/29/2020, 8:03 AM  [place], [place], [address] Phone: [phone]   Fax:  [phone]  Name: Hannah Cooper MRN: [id number] Date of Birth: 1956-03-11

## 2020-09-30 ENCOUNTER — Ambulatory Visit (HOSPITAL_COMMUNITY): Payer: HMO | Admitting: Hematology

## 2020-10-01 ENCOUNTER — Other Ambulatory Visit: Payer: Self-pay

## 2020-10-01 ENCOUNTER — Encounter (HOSPITAL_COMMUNITY): Payer: Self-pay | Admitting: Occupational Therapy

## 2020-10-01 ENCOUNTER — Ambulatory Visit (HOSPITAL_COMMUNITY): Payer: PRIVATE HEALTH INSURANCE | Admitting: Occupational Therapy

## 2020-10-01 DIAGNOSIS — M25612 Stiffness of left shoulder, not elsewhere classified: Secondary | ICD-10-CM | POA: Diagnosis not present

## 2020-10-01 DIAGNOSIS — M25512 Pain in left shoulder: Secondary | ICD-10-CM

## 2020-10-01 DIAGNOSIS — R29898 Other symptoms and signs involving the musculoskeletal system: Secondary | ICD-10-CM

## 2020-10-01 NOTE — Therapy (Signed)
[place], [place], [address] Phone: [phone]   Fax:  [phone]  Occupational Therapy Treatment  Patient Details  Name: Hannah Cooper MRN: [id number] Date of Birth: October 28, 1955 Referring Provider (OT): Arther Abbott, MD   Encounter Date: 10/01/2020   OT End of Session - 10/01/20 0814     Visit Number 23    Number of Visits 24    Date for OT Re-Evaluation 10/07/20    Authorization Type 1) [employer] 2) [employer]    Authorization Time Period no copay. no visit limit Fax initial evaluation for medical necessity.    Progress Note Due on Visit 37    OT Start Time [phone]    OT Stop Time 0813    OT Time Calculation (min) 39 min    Activity Tolerance Patient tolerated treatment well    Behavior During Therapy WFL for tasks assessed/performed             Past Medical History:  Diagnosis Date   Arthritis    Atypical chest pain    chronic   Cancer (HCC)    bone cancer   Chronic back pain    Chronic diastolic CHF (congestive heart failure) (HCC)    COPD (chronic obstructive pulmonary disease) (HCC)    DDD (degenerative disc disease), cervical    History of radiation therapy 07/16/19-08/22/19   L4 spine ; Dr. Gery Pray   Hyperlipidemia    Hypertension    Lumbar radiculopathy    Meningitis    Normal coronary arteries 2014   Palpitations    Premature atrial contractions    PVC's (premature ventricular contractions)     Past Surgical History:  Procedure Laterality Date   ABDOMINAL HYSTERECTOMY     IR FLUORO GUIDED NEEDLE PLC ASPIRATION/INJECTION LOC  06/19/2019   LEFT HEART CATHETERIZATION WITH CORONARY ANGIOGRAM N/A 09/05/2012   Procedure: LEFT HEART CATHETERIZATION WITH CORONARY ANGIOGRAM;  Surgeon: Wellington Hampshire, MD;  Location: [facility];  Service: Cardiovascular;  Laterality: N/A;   RESECTION DISTAL CLAVICAL Left 07/03/2020   Procedure: RESECTION DISTAL CLAVICAL;  Surgeon:  Carole Civil, MD;  Location: [facility];  Service: Orthopedics;  Laterality: Left;   SHOULDER OPEN ROTATOR CUFF REPAIR Left 07/03/2020   Procedure: ROTATOR CUFF REPAIR SHOULDER OPEN WITH CHROMEOPLASTY;  Surgeon: Carole Civil, MD;  Location: [facility];  Service: Orthopedics;  Laterality: Left;   THYROID SURGERY  2002    There were no vitals filed for this visit.   Subjective Assessment - 10/01/20 0734     Subjective  S: It was hurting last night so I took an ibuprofen.    Currently in Pain? No/denies                [facility] OT Assessment - 10/01/20 0734       Assessment   Medical Diagnosis Left RTC repair      Precautions   Precautions Shoulder;Other (comment)    Type of Shoulder Precautions Standard RCR protocol   Sling on 4-6 weeks   0-6 weeks (3/18-4/29)-: PROM, pendulum   6-12 weeks (4/29-6/10): AAROM progressing to AROM   12-24 weeks: Strengthening exercises    Precaution Comments History of Plasmocytoma.                      OT Treatments/Exercises (OP) - 10/01/20 0735       Exercises   Exercises Shoulder      Shoulder  Exercises: Supine   Protraction PROM;5 reps;Strengthening;10 reps    Protraction Weight (lbs) 1    Horizontal ABduction PROM;5 reps;Strengthening;10 reps    Horizontal ABduction Weight (lbs) 1    External Rotation PROM;5 reps;Strengthening;10 reps    External Rotation Weight (lbs) 1    Internal Rotation PROM;5 reps;Strengthening;10 reps    Internal Rotation Weight (lbs) 1    Flexion PROM;5 reps;Strengthening;10 reps    Shoulder Flexion Weight (lbs) 1    ABduction PROM;5 reps;Strengthening;10 reps    Shoulder ABduction Weight (lbs) 1      Shoulder Exercises: Standing   Protraction Strengthening;10 reps    Protraction Weight (lbs) 1    Horizontal ABduction Strengthening;10 reps    Horizontal ABduction Weight (lbs) 1    External Rotation Strengthening;10 reps    External Rotation Weight (lbs) 1    Internal Rotation  Strengthening;10 reps    Internal Rotation Weight (lbs) 1    Flexion Strengthening;10 reps    Shoulder Flexion Weight (lbs) 1    ABduction Strengthening;10 reps    Shoulder ABduction Weight (lbs) 1    Extension Theraband;15 reps    Theraband Level (Shoulder Extension) Level 2 (Red)    Row Theraband;15 reps    Theraband Level (Shoulder Row) Level 2 (Red)    Retraction Theraband;15 reps    Theraband Level (Shoulder Retraction) Level 2 (Red)      Shoulder Exercises: Therapy Ball   Other Therapy Ball Exercises green therapy ball: chest press, flexion, circles each direction, 10X      Shoulder Exercises: ROM/Strengthening   UBE (Upper Arm Bike) Level 2 2' reverse, pace 7.0    Over Head Lace 2' seated    X to V Arms 10X    Proximal Shoulder Strengthening, Supine 10X each, 1#, no rest breaks    Ball on Wall 1' flexion 1' abduction green ball      Manual Therapy   Manual Therapy Myofascial release    Manual therapy comments manual therapy completed seperately from all other interventions this date    Myofascial Release myofascial release and manual stretching to left upper arm, trapezius, and scapular regions to decrease pain and fascial restrictions and increase joint ROM                      OT Short Term Goals - 09/17/20 0839       OT SHORT TERM GOAL #1   Title Patient will be educated and independent with HEP in order to facilitate her progress in therapy and begin to use her LUE as her non dominant extremity for 50% or more of daily tasks.    Time 6    Period Weeks    Status On-going    Target Date 08/26/20      OT SHORT TERM GOAL #2   Title patient will increase her LUE P/ROM to [facility] in order to increase ability to complete upper body dressing tasks.    Time 6    Period Weeks      OT SHORT TERM GOAL #3   Title Patient will increase her LUE strength to 3/5 in order to complete functional waist level self care tasks with less difficulty.    Time 6    Period  Weeks      OT SHORT TERM GOAL #4   Title Patient will report a decrease in pain for her LUE of approximately 6/10 or less while completing dressing and bathing tasks.    Time  6    Period Weeks      OT SHORT TERM GOAL #5   Title Patient will decrease LUE fascial restrictions to moderate amount in order to increase her functional mobility needed to complete low level reaching tasks.    Time 6    Period Weeks               OT Long Term Goals - 09/09/20 0806       OT LONG TERM GOAL #1   Title Patient will increase her LUE A/ROM to [facility] in order to complete reaching tasks at or above her shoulder level.    Time 12    Period Weeks    Status On-going      OT LONG TERM GOAL #2   Title Patient will increase her LUE strength to 4/5 or better in order to return to household lifting tasks while using her LUE.    Time 12    Period Weeks    Status On-going      OT LONG TERM GOAL #3   Title Patient will report a decrease in pain level in her LUE to 3/10 or less while completing daily tasks.    Time 12    Period Weeks    Status On-going      OT LONG TERM GOAL #4   Title Patient will decrease her LUE fascial restrictions to min amount or less in order to increase the functional mobility needed to complete reaching tasks.    Time 12    Period Weeks    Status On-going                   Plan - 10/01/20 0748     Clinical Impression Statement A: Pt reports she usually goes to bed sore but after taking Ibuprofen last night she did not have any soreness this morning when she woke up. Continued with myofascial release to upper trapezius and scapular regions to address fascial restrictions. Progressed to strengthening using 1# weights in supine and standing. Added x to v arms, continued with therapy ball strengthening. Increased to 15 reps with scapular theraband. Verbal cuing for form and technique.    Body Structure / Function / Physical Skills ADL;UE functional use;Fascial  restriction;Pain;ROM;Decreased knowledge of precautions;Strength;Mobility    Plan P: Ressessment, discharge with strengthening HEP    OT Home Exercise Plan eval: A/ROM elbow, wrist, forearm, seated A/ROM scapular exercises; 4/6: pendulums; 4/15: table slides 4/27: AA/ROM er supine 5/5: AA/ROm supine except abduction 5/10: standing AA/ROM protraction, abduction, flexion 5/12: AA/ROM standing (all); 5/25: A/ROM; 6/9: red scapular theraband, shoulder stretches    Consulted and Agree with Plan of Care Patient             Patient will benefit from skilled therapeutic intervention in order to improve the following deficits and impairments:   Body Structure / Function / Physical Skills: ADL, UE functional use, Fascial restriction, Pain, ROM, Decreased knowledge of precautions, Strength, Mobility       Visit Diagnosis: Stiffness of left shoulder, not elsewhere classified  Acute pain of left shoulder  Other symptoms and signs involving the musculoskeletal system    Problem List Patient Active Problem List   Diagnosis Date Noted   S/p left rotator cuff repair distal clavicle resection 07/03/20  07/07/2020   Complete tear of left rotator cuff    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[place]) 07/10/2019   Plasma cell disorder 06/10/2019   Lesion of bone of lumbosacral  spine 05/28/2019   Left-sided weakness 03/13/2016   Cerebrovascular accident (CVA) ([place])    Palpitations    COPD (chronic obstructive pulmonary disease) ([place]) 08/20/2014   Dyspnea 12/19/2013   Chronic diastolic CHF (congestive heart failure) ([place]) 07/05/2013   Lower extremity edema 07/05/2013   Hyperlipidemia    Chest pain 07/04/2013   Hypokalemia 09/04/2012   Precordial pain 09/04/2012   Viral meningitis 01/22/2011   Vomiting 01/22/2011   PNEUMONIA, LEFT LOWER LOBE 10/10/2006   DISEASE, ACUTE BRONCHOSPASM 10/10/2006   Essential hypertension 05/23/2006   OSTEOARTHRITIS 05/23/2006    Guadelupe Sabin, OTR/L   [phone] 10/01/2020, 8:15 AM  [place], [place], [address] Phone: [phone]   Fax:  [phone]  Name: CHAYANNE FILIPPI MRN: [id number] Date of Birth: 09-30-1955

## 2020-10-08 ENCOUNTER — Ambulatory Visit (HOSPITAL_COMMUNITY)
Admission: RE | Admit: 2020-10-08 | Discharge: 2020-10-08 | Disposition: A | Discharge status: Home / Self Care | Payer: PRIVATE HEALTH INSURANCE | Source: Ambulatory Visit | Attending: Hematology | Admitting: Hematology

## 2020-10-08 DIAGNOSIS — D1809 Hemangioma of other sites: Secondary | ICD-10-CM | POA: Diagnosis not present

## 2020-10-08 DIAGNOSIS — M5124 Other intervertebral disc displacement, thoracic region: Secondary | ICD-10-CM | POA: Diagnosis not present

## 2020-10-08 DIAGNOSIS — C9030 Solitary plasmacytoma not having achieved remission: Secondary | ICD-10-CM | POA: Insufficient documentation

## 2020-10-08 DIAGNOSIS — M899 Disorder of bone, unspecified: Secondary | ICD-10-CM | POA: Diagnosis not present

## 2020-10-08 DIAGNOSIS — M47814 Spondylosis without myelopathy or radiculopathy, thoracic region: Secondary | ICD-10-CM | POA: Diagnosis not present

## 2020-10-08 MED ORDER — GADOBUTROL 1 MMOL/ML IV SOLN
10.0000 mL | Freq: Once | INTRAVENOUS | Status: AC | PRN
  Administered 2020-10-08: 10 mL via INTRAVENOUS

## 2020-10-09 ENCOUNTER — Encounter (HOSPITAL_COMMUNITY): Payer: Self-pay | Admitting: Specialist

## 2020-10-09 ENCOUNTER — Ambulatory Visit (HOSPITAL_COMMUNITY): Payer: PRIVATE HEALTH INSURANCE | Admitting: Specialist

## 2020-10-09 ENCOUNTER — Other Ambulatory Visit: Payer: Self-pay

## 2020-10-09 DIAGNOSIS — M25612 Stiffness of left shoulder, not elsewhere classified: Secondary | ICD-10-CM

## 2020-10-09 DIAGNOSIS — M25512 Pain in left shoulder: Secondary | ICD-10-CM

## 2020-10-09 DIAGNOSIS — R29898 Other symptoms and signs involving the musculoskeletal system: Secondary | ICD-10-CM

## 2020-10-09 NOTE — Therapy (Signed)
[place], [place], [address] Phone: [phone]   Fax:  [phone]  Occupational Therapy Treatment  Patient Details  Name: Hannah Cooper MRN: [id number] Date of Birth: [birth year] Referring Provider (OT): Arther Abbott, MD   Encounter Date: 10/09/2020   OT End of Session - 10/09/20 0944     Visit Number 24    Number of Visits 24    Date for OT Re-Evaluation 10/09/20    Authorization Type 1) [employer] 2) [employer]    Authorization Time Period no copay. no visit limit Fax initial evaluation for medical necessity.    OT Start Time [phone]    OT Stop Time 0940    OT Time Calculation (min) 30 min    Activity Tolerance Patient tolerated treatment well    Behavior During Therapy WFL for tasks assessed/performed             Past Medical History:  Diagnosis Date   Arthritis    Atypical chest pain    chronic   Cancer ([place])    bone cancer   Chronic back pain    Chronic diastolic CHF (congestive heart failure) (HCC)    COPD (chronic obstructive pulmonary disease) (HCC)    DDD (degenerative disc disease), cervical    History of radiation therapy 07/16/19-08/22/19   L4 spine ; Dr. Gery Pray   Hyperlipidemia    Hypertension    Lumbar radiculopathy    Meningitis    Normal coronary arteries 2014   Palpitations    Premature atrial contractions    PVC's (premature ventricular contractions)     Past Surgical History:  Procedure Laterality Date   ABDOMINAL HYSTERECTOMY     IR FLUORO GUIDED NEEDLE PLC ASPIRATION/INJECTION LOC  06/19/2019   LEFT HEART CATHETERIZATION WITH CORONARY ANGIOGRAM N/A 09/05/2012   Procedure: LEFT HEART CATHETERIZATION WITH CORONARY ANGIOGRAM;  Surgeon: Wellington Hampshire, MD;  Location: [facility];  Service: Cardiovascular;  Laterality: N/A;   RESECTION DISTAL CLAVICAL Left 07/03/2020   Procedure: RESECTION DISTAL CLAVICAL;  Surgeon: Carole Civil, MD;   Location: [facility];  Service: Orthopedics;  Laterality: Left;   SHOULDER OPEN ROTATOR CUFF REPAIR Left 07/03/2020   Procedure: ROTATOR CUFF REPAIR SHOULDER OPEN WITH CHROMEOPLASTY;  Surgeon: Carole Civil, MD;  Location: [facility];  Service: Orthopedics;  Laterality: Left;   THYROID SURGERY  2002    There were no vitals filed for this visit.   Subjective Assessment - 10/09/20 0943     Subjective  S:  I can do pretty much anything I want to with my left arm.    Currently in Pain? No/denies                [facility] OT Assessment - 10/09/20 0001       Assessment   Medical Diagnosis Left RTC repair    Referring Provider (OT) Arther Abbott, MD    Onset Date/Surgical Date 07/03/20      Precautions   Precautions Shoulder;Other (comment)    Type of Shoulder Precautions Standard RCR protocol   Sling on 4-6 weeks   0-6 weeks (3/18-4/29)-: PROM, pendulum   6-12 weeks (4/29-6/10): AAROM progressing to AROM   12-24 weeks: Strengthening exercises      ADL   ADL comments able to use LUE with all desired daily tasks without difficulty or pain      Observation/Other Assessments   Focus on Therapeutic Outcomes (FOTO)  72% independent  AROM   Left Shoulder Flexion 140 Degrees   124   Left Shoulder ABduction 160 Degrees   135   Left Shoulder Internal Rotation 90 Degrees   90   Left Shoulder External Rotation 81 Degrees   50     PROM   Left Shoulder Flexion 170 Degrees   131   Left Shoulder ABduction 144 Degrees   144   Left Shoulder Internal Rotation 90 Degrees   90   Left Shoulder External Rotation 60 Degrees   60     Strength   Left Shoulder Flexion 4+/5   4-/5   Left Shoulder ABduction 4+/5   4-/5   Left Shoulder Internal Rotation 5/5   5/5   Left Shoulder External Rotation 4+/5   4/5                     OT Treatments/Exercises (OP) - 10/09/20 0001       Manual Therapy   Manual Therapy Myofascial release    Manual therapy comments manual therapy completed  seperately from all other interventions this date    Myofascial Release myofascial release and manual stretching to left upper arm, trapezius, and scapular regions to decrease pain and fascial restrictions and increase joint ROM                    OT Education - 10/09/20 0943     Education provided Yes    Education Details updated HEP to green theraband for scapular strengthening and recommended adding 1# to HEP A/ROM exercises    Person(s) Educated Patient    Methods Explanation    Comprehension Verbalized understanding              OT Short Term Goals - 10/09/20 0933       OT SHORT TERM GOAL #1   Title Patient will be educated and independent with HEP in order to facilitate her progress in therapy and begin to use her LUE as her non dominant extremity for 50% or more of daily tasks.    Time 6    Period Weeks    Status Achieved    Target Date 08/26/20      OT SHORT TERM GOAL #2   Title patient will increase her LUE P/ROM to [facility] in order to increase ability to complete upper body dressing tasks.    Time 6    Period Weeks    Status Achieved      OT SHORT TERM GOAL #3   Title Patient will increase her LUE strength to 3/5 in order to complete functional waist level self care tasks with less difficulty.    Time 6    Period Weeks    Status Achieved      OT SHORT TERM GOAL #4   Title Patient will report a decrease in pain for her LUE of approximately 6/10 or less while completing dressing and bathing tasks.    Time 6    Period Weeks    Status Achieved      OT SHORT TERM GOAL #5   Title Patient will decrease LUE fascial restrictions to moderate amount in order to increase her functional mobility needed to complete low level reaching tasks.    Time 6    Period Weeks    Status Achieved               OT Long Term Goals - 10/09/20 9150       OT  LONG TERM GOAL #1   Title Patient will increase her LUE A/ROM to [facility] in order to complete reaching tasks at or  above her shoulder level.    Time 12    Period Weeks    Status Achieved      OT LONG TERM GOAL #2   Title Patient will increase her LUE strength to 4/5 or better in order to return to household lifting tasks while using her LUE.    Time 12    Period Weeks    Status Achieved      OT LONG TERM GOAL #3   Title Patient will report a decrease in pain level in her LUE to 3/10 or less while completing daily tasks.    Time 12    Period Weeks    Status Achieved      OT LONG TERM GOAL #4   Title Patient will decrease her LUE fascial restrictions to min amount or less in order to increase the functional mobility needed to complete reaching tasks.    Time 12    Period Weeks    Status Achieved                   Plan - 10/09/20 0945     Clinical Impression Statement A:  Patient has progressed to [facility] for A/ROM, strength, and completion of all desired daily tasks with her LUE.  Her FOTO score has increased to 72% independent.  Mrs. Kehoe is pleased with her current functional status and desires to be dc this date.    Body Structure / Function / Physical Skills ADL;UE functional use;Fascial restriction;Pain;ROM;Decreased knowledge of precautions;Strength;Mobility    OT Frequency 1x / week    OT Duration --   1 week   OT Treatment/Interventions Self-care/ADL training;Ultrasound;Patient/family education;DME and/or AE instruction;Passive range of motion;Cryotherapy;Electrical Stimulation;Moist Heat;Neuromuscular education;Therapeutic exercise;Manual Therapy;Therapeutic activities    Plan P:  DC from skilled OT intervention this date. Patient has met all OT goals.    OT Home Exercise Plan eval: A/ROM elbow, wrist, forearm, seated A/ROM scapular exercises; 4/6: pendulums; 4/15: table slides 4/27: AA/ROM er supine 5/5: AA/ROm supine except abduction 5/10: standing AA/ROM protraction, abduction, flexion 5/12: AA/ROM standing (all); 5/25: A/ROM; 6/9: red scapular theraband, shoulder stretches,  10/09/20:  green theraband and 1# strengthening    Consulted and Agree with Plan of Care Patient             Patient will benefit from skilled therapeutic intervention in order to improve the following deficits and impairments:   Body Structure / Function / Physical Skills: ADL, UE functional use, Fascial restriction, Pain, ROM, Decreased knowledge of precautions, Strength, Mobility       Visit Diagnosis: Stiffness of left shoulder, not elsewhere classified - Plan: Ot plan of care cert/re-cert  Acute pain of left shoulder - Plan: Ot plan of care cert/re-cert  Other symptoms and signs involving the musculoskeletal system - Plan: Ot plan of care cert/re-cert    Problem List Patient Active Problem List   Diagnosis Date Noted   S/p left rotator cuff repair distal clavicle resection 07/03/20  07/07/2020   Complete tear of left rotator cuff    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/2019   Plasma cell disorder 06/10/2019   Lesion of bone of lumbosacral spine 05/28/2019   Left-sided weakness 03/13/2016   Cerebrovascular accident (CVA) ([place])    Palpitations    COPD (chronic obstructive pulmonary disease) ([place]) 08/20/2014   Dyspnea 12/19/2013  Chronic diastolic CHF (congestive heart failure) ([place]) 07/05/2013   Lower extremity edema 07/05/2013   Hyperlipidemia    Chest pain 07/04/2013   Hypokalemia 09/04/2012   Precordial pain 09/04/2012   Viral meningitis 01/22/2011   Vomiting 01/22/2011   PNEUMONIA, LEFT LOWER LOBE 10/10/2006   DISEASE, ACUTE BRONCHOSPASM 10/10/2006   Essential hypertension 05/23/2006   OSTEOARTHRITIS 05/23/2006    Vangie Bicker, MHA, OTR/L [phone] OCCUPATIONAL THERAPY DISCHARGE SUMMARY  Visits from Start of Care: 24  Current functional level related to goals / functional outcomes: Independent    Remaining deficits: N/a   Education / Equipment: See ab0ve   Patient agrees to discharge. Patient goals were met. Patient is  being discharged due to meeting the stated rehab goals.Vangie Bicker, [place], OTR/L [phone]   10/09/2020, 10:12 AM  [place], [place], [address] Phone: [phone]   Fax:  [phone]  Name: Hannah Cooper MRN: [id number] Date of Birth: 08-29-1955

## 2020-10-12 ENCOUNTER — Inpatient Hospital Stay (HOSPITAL_COMMUNITY): Payer: HMO | Admitting: Hematology

## 2020-10-14 NOTE — Progress Notes (Signed)
MRI results do not show any lesions concerning for bone cancer or malignancy; no evidence of acute fracture.  Back pain is likely secondary to degenerative disc disease as noted on MRI.  If back pain is severe or associated with neurologic deficits, patient should present to the ED.  For mild to moderate pain, she should follow-up with her primary care doctor for chronic back pain and degenerative disc disease.  Otherwise, she can follow-up as previously scheduled with Dr. Delton Coombes.

## 2020-10-22 ENCOUNTER — Encounter: Payer: HMO | Admitting: Orthopedic Surgery

## 2020-10-25 NOTE — Progress Notes (Signed)
[place] [place], [place] [address]   CLINIC:  Medical Oncology/Hematology  PCP:  Scherrie Bateman [address] / [place] [address] [phone]   REASON FOR VISIT:  Follow-up for L4 plasmacytoma  PRIOR THERAPY: XRT in 27 fractions from 07/17/2019 to 08/22/2019  NGS Results: Not done  CURRENT THERAPY: Surveillance  BRIEF ONCOLOGIC HISTORY:  Oncology History   No history exists.    CANCER STAGING: Cancer Staging No matching staging information was found for the patient.  INTERVAL HISTORY:  [name], a 65 y.o. female, returns for routine follow-up of her L4 plasmacytoma. Saya was last seen on 09/15/2020 by Dr. Delton Coombes.  At today's visit, she reports feeling fair.  She denies any recent hospitalizations, surgeries, or changes in her baseline health status.  Unfortunately, her father passed away unexpectedly last week, so she is dealing with significant amount of grief at this time.  At her last visit, she had complained of some new and worsening back pain.  MRI performed showed degenerative disc disease but was negative for any malignant-appearing lesions.  Her mid back pain has now resolved.  She continues to have chronic low back pain associated with plasmacytoma at her L4 region.  It is rated as 8/10, relieved by medication.    No new fatigue, fever, chills, night sweats, unintentional weight loss.  She denies shortness of breath, cough, chest pain, nausea, vomiting, abdominal pain.  Denies any signs or symptoms of blood loss.  No current signs or symptoms of blood clots.  She currently reports 50% energy and 75% appetite.  She is maintaining a stable weight at this time.  REVIEW OF SYSTEMS:  Review of Systems  Constitutional:  Positive for fatigue. Negative for appetite change, chills, diaphoresis, fever and unexpected weight change.  HENT:   Negative for lump/mass and nosebleeds.   Eyes:  Negative for  eye problems.  Respiratory:  Positive for cough (Secondary to allergies). Negative for hemoptysis and shortness of breath.   Cardiovascular:  Negative for chest pain, leg swelling and palpitations.  Gastrointestinal:  Negative for abdominal pain, blood in stool, constipation, diarrhea, nausea and vomiting.  Genitourinary:  Negative for hematuria.   Musculoskeletal:  Positive for back pain.  Skin: Negative.   Neurological:  Positive for dizziness, headaches and numbness (Bilateral hands). Negative for light-headedness.  Hematological:  Does not bruise/bleed easily.   PAST MEDICAL/SURGICAL HISTORY:  Past Medical History:  Diagnosis Date   Arthritis    Atypical chest pain    chronic   Cancer (HCC)    bone cancer   Chronic back pain    Chronic diastolic CHF (congestive heart failure) (HCC)    COPD (chronic obstructive pulmonary disease) (HCC)    DDD (degenerative disc disease), cervical    History of radiation therapy 07/16/19-08/22/19   L4 spine ; Dr. Gery Pray   Hyperlipidemia    Hypertension    Lumbar radiculopathy    Meningitis    Normal coronary arteries 2014   Palpitations    Premature atrial contractions    PVC's (premature ventricular contractions)    Past Surgical History:  Procedure Laterality Date   ABDOMINAL HYSTERECTOMY     IR FLUORO GUIDED NEEDLE PLC ASPIRATION/INJECTION LOC  06/19/2019   LEFT HEART CATHETERIZATION WITH CORONARY ANGIOGRAM N/A 09/05/2012   Procedure: LEFT HEART CATHETERIZATION WITH CORONARY ANGIOGRAM;  Surgeon: Wellington Hampshire, MD;  Location: [facility];  Service: Cardiovascular;  Laterality: N/A;   RESECTION  DISTAL CLAVICAL Left 07/03/2020   Procedure: RESECTION DISTAL CLAVICAL;  Surgeon: Carole Civil, MD;  Location: [facility];  Service: Orthopedics;  Laterality: Left;   SHOULDER OPEN ROTATOR CUFF REPAIR Left 07/03/2020   Procedure: ROTATOR CUFF REPAIR SHOULDER OPEN WITH CHROMEOPLASTY;  Surgeon: Carole Civil, MD;  Location: [facility];   Service: Orthopedics;  Laterality: Left;   THYROID SURGERY  2002    SOCIAL HISTORY:  Social History   Socioeconomic History   Marital status: Married    Spouse name: Not on file   Number of children: Not on file   Years of education: Not on file   Highest education level: Not on file  Occupational History   Not on file  Tobacco Use   Smoking status: Never   Smokeless tobacco: Never  Vaping Use   Vaping Use: Never used  Substance and Sexual Activity   Alcohol use: No    Alcohol/week: 0.0 standard drinks   Drug use: No   Sexual activity: Yes    Partners: Male  Other Topics Concern   Not on file  Social History Narrative   Not on file   Social Determinants of Health   Financial Resource Strain: Not on file  Food Insecurity: Not on file  Transportation Needs: Not on file  Physical Activity: Not on file  Stress: Not on file  Social Connections: Not on file  Intimate Partner Violence: Not on file    FAMILY HISTORY:  Family History  Problem Relation Age of Onset   Heart attack Mother 78   Heart attack Sister 67    CURRENT MEDICATIONS:  Current Outpatient Medications  Medication Sig Dispense Refill   albuterol (PROVENTIL) (2.5 MG/3ML) 0.083% nebulizer solution Take 2.5 mg by nebulization every 6 (six) hours as needed for wheezing or shortness of breath.  (Patient not taking: Reported on 09/15/2020)     albuterol (VENTOLIN HFA) 108 (90 Base) MCG/ACT inhaler Inhale 1-2 puffs into the lungs every 4 (four) hours as needed for wheezing or shortness of breath. (Patient not taking: Reported on 09/15/2020) 18 g 0   Apoaequorin (PREVAGEN) 10 MG CAPS Take 10 mg by mouth daily.     chlorthalidone (HYGROTON) 50 MG tablet Take 1 tablet (50 mg total) by mouth daily. 90 tablet 3   Cholecalciferol (DIALYVITE VITAMIN D 5000) 125 MCG (5000 UT) capsule Take 5,000 Units by mouth daily.     cycloSPORINE (RESTASIS) 0.05 % ophthalmic emulsion Place 1 drop into both eyes 2 (two) times daily.      diltiazem (CARDIZEM) 30 MG tablet Take 1 tablet by mouth twice daily 180 tablet 3   furosemide (LASIX) 20 MG tablet Take 1 tablet daily as needed may take 2 tablets prn (Patient taking differently: Take 20-40 mg by mouth daily as needed for edema.) 60 tablet 1   glipiZIDE-metformin (METAGLIP) 5-500 MG tablet Take 1 tablet by mouth in the morning and at bedtime.     HYDROcodone-acetaminophen (NORCO/VICODIN) 5-325 MG tablet Take 1 tablet by mouth every 6 (six) hours as needed for moderate pain. 60 tablet 0   ibuprofen (ADVIL) 800 MG tablet Take 1 tablet (800 mg total) by mouth every 8 (eight) hours as needed. 90 tablet 1   Insulin Glargine (BASAGLAR KWIKPEN) 100 UNIT/ML Inject 15 Units into the skin at bedtime as needed (blood sugar over 150).     isosorbide mononitrate (IMDUR) 30 MG 24 hr tablet TAKE 1 TABLET (30 MG TOTAL) BY MOUTH DAILY. (Patient taking differently: Take  30 mg by mouth daily.) 90 tablet 0   lidocaine (LIDODERM) 5 % Place 2 patches onto the skin daily. Remove & Discard patch within 12 hours or as directed by MD 60 patch 1   losartan (COZAAR) 100 MG tablet Take 100 mg by mouth daily.     metFORMIN (GLUCOPHAGE) 500 MG tablet Take 500 mg by mouth 2 (two) times daily.     metoprolol tartrate (LOPRESSOR) 100 MG tablet Take 100 mg by mouth 2 (two) times daily.     Multiple Vitamin (MULTIVITAMIN WITH MINERALS) TABS tablet Take 1 tablet by mouth daily.     mupirocin ointment (BACTROBAN) 2 % Apply 1 application topically 3 (three) times daily as needed (wound care).     ondansetron (ZOFRAN) 4 MG tablet Take 1 tablet (4 mg total) by mouth every 8 (eight) hours as needed for nausea or vomiting. 12 tablet 0   potassium chloride SA (KLOR-CON) 20 MEQ tablet Take 2 tablets (40 mEq total) by mouth daily. 60 tablet 3   RELION PEN NEEDLES 31G X 6 MM MISC USE 1 PEN NEEDLE TO INJECT INSULIN AT BEDTIME     tiZANidine (ZANAFLEX) 4 MG tablet Take 1 tablet (4 mg total) by mouth daily. 30 tablet 1    TURMERIC PO Take 1,000 mg by mouth daily.     No current facility-administered medications for this visit.    ALLERGIES:  Allergies  Allergen Reactions   Other Swelling    [place]on lipstick    PHYSICAL EXAM:  Performance status (ECOG): 1 - Symptomatic but completely ambulatory  There were no vitals filed for this visit. Wt Readings from Last 3 Encounters:  09/15/20 221 lb 11.2 oz (100.6 kg)  09/12/20 220 lb (99.8 kg)  07/30/20 222 lb 3.2 oz (100.8 kg)   Physical Exam Constitutional:      Appearance: Normal appearance.  HENT:     Head: Normocephalic and atraumatic.     Mouth/Throat:     Mouth: Mucous membranes are moist.  Eyes:     Extraocular Movements: Extraocular movements intact.     Pupils: Pupils are equal, round, and reactive to light.  Cardiovascular:     Rate and Rhythm: Normal rate and regular rhythm.     Pulses: Normal pulses.     Heart sounds: Normal heart sounds.  Pulmonary:     Effort: Pulmonary effort is normal.     Breath sounds: Normal breath sounds.  Abdominal:     General: Bowel sounds are normal.     Palpations: Abdomen is soft.     Tenderness: There is no abdominal tenderness.  Musculoskeletal:        General: No swelling.     Right lower leg: No edema.     Left lower leg: No edema.  Lymphadenopathy:     Cervical: No cervical adenopathy.  Skin:    General: Skin is warm and dry.  Neurological:     General: No focal deficit present.     Mental Status: She is alert and oriented to person, place, and time.  Psychiatric:        Mood and Affect: Mood normal.        Behavior: Behavior normal.     LABORATORY DATA:  I have reviewed the labs as listed.  CBC Latest Ref Rng & Units 09/15/2020 09/12/2020 07/02/2020  WBC 4.0 - 10.5 K/uL 3.6(L) 3.8(L) 3.2(L)  Hemoglobin 12.0 - 15.0 g/dL 11.6(L) 11.7(L) 11.6(L)  Hematocrit 36.0 - 46.0 % 33.7(L) 35.4(L) 34.8(L)  Platelets 150 - 400 K/uL 310 318 284   CMP Latest Ref Rng & Units 09/15/2020 09/12/2020  07/02/2020  Glucose 70 - 99 mg/dL 139(H) 145(H) 142(H)  BUN 8 - 23 mg/dL 16 19 19   Creatinine 0.44 - 1.00 mg/dL 0.82 0.86 0.86  Sodium 135 - 145 mmol/L 135 135 138  Potassium 3.5 - 5.1 mmol/L 2.7(LL) 2.8(L) 3.3(L)  Chloride 98 - 111 mmol/L 98 96(L) 101  CO2 22 - 32 mmol/L 32 32 26  Calcium 8.9 - 10.3 mg/dL 9.3 9.2 9.6  Total Protein 6.5 - 8.1 g/dL 8.1 8.1 -  Total Bilirubin 0.3 - 1.2 mg/dL 0.6 0.6 -  Alkaline Phos 38 - 126 U/L 62 68 -  AST 15 - 41 U/L 24 19 -  ALT 0 - 44 U/L 27 24 -    DIAGNOSTIC IMAGING:  I have independently reviewed the scans and discussed with the patient. MR Thoracic Spine W Wo Contrast  Result Date: 10/09/2020 CLINICAL DATA:  New onset mid back pain.  History of L4 plasmacytoma EXAM: MRI THORACIC WITHOUT AND WITH CONTRAST TECHNIQUE: Multiplanar and multiecho pulse sequences of the thoracic spine were obtained without and with intravenous contrast. CONTRAST:  58m GADAVIST GADOBUTROL 1 MMOL/ML IV SOLN COMPARISON:  CT 09/12/2020, MRI 06/07/2019 FINDINGS: Alignment:  Physiologic. Vertebrae: Vertebral body heights are maintained without evidence of fracture. No evidence of discitis. Diffuse marrow heterogeneity without discrete marrow replacing lesion. Findings are nonspecific but can be seen in the setting of chronic anemia, smoking, and/or obesity. There are a few scattered benign T1/T2 hyperintense intraosseous hemangiomas within the thoracic spine. Cord: Normal signal and morphology. No epidural mass or fluid collection. Paraspinal and other soft tissues: Negative. Disc levels: Similar degree of mild degenerative changes of the thoracic spine with a few tiny disc protrusions including at the T3-4 and T5-6 levels. No more than mild facet arthropathy within the thoracic spine. There is no canal or foraminal stenosis at any level. IMPRESSION: 1. No evidence of acute fracture or suspicious marrow replacing bone lesion of the thoracic spine. 2. Similar degree of mild degenerative  changes within the thoracic spine. No canal or foraminal stenosis at any level. 3. Mild diffuse marrow heterogeneity, nonspecific but can be seen in the setting of chronic anemia, smoking, and/or obesity. Electronically Signed   By: NDavina PokeD.O.   On: 10/09/2020 11:14     ASSESSMENT & PLAN: 1.  Plasmacytoma of L4 vertebral body: -MRI lumbar spine (05/23/2019) shows L4 lesion.  (Was initially seen on imaging in 2018 but thought to be benign at that time not followed up on that until February 2021) -PET scan on 06/10/2019 shows mildly increased FDG uptake associated with mixed lytic/sclerotic lesion involving L4 vertebral body SUV 4.4. -Serum immunofixation shows IgG kappa monoclonal protein.  SPEP- no M spike.  Kappa light chains elevated at 25.3.  Ratio 1.12.  Lambda light chains 21.9.  Beta-2 microglobulin 2.2. -24-hour urine was negative for UPEP and immunofixation.  Protein was 130 mg. -L4 needle biopsy on 06/19/2019 showed minute segments of the bone and soft tissue, limited cellularity.  IHC for CD138 highlights scattering plasma cells.  Cytokeratin is negative.  Few kappa positive plasma cells present.  Overall material is very limited and essentially nondiagnostic. -Clinically this is consistent with plasmacytoma.  We reviewed bone marrow biopsy results which showed normocellular marrow.  Cytogenetics are normal. -XRT to L4 vertebral body from 07/16/2019 through 08/22/2019. -MRI of the lumbar spine on 12/03/2019 showed unchanged size  of the L4 vertebral body lesion, slightly decreased contrast-enhancement with no new lesions. - CT scan of the L-spine and thoracic spine from 09/12/2020 showed unremarkable appearance of treated plasmacytoma at L4.  Lower lumbar facet osteoarthritis and focal advanced L5-S1 disc degeneration. - Most recent SPEP (09/15/2020) negative for M spike, immunofixation shows polyclonal increase in immunoglobulins; light chains show mildly elevated kappa 36.2, lambda 20.9, with  increased free light chain ratio 1.73 - PLAN: Due to increased light chain ratio, will follow closely with repeat myeloma labs and office visit in 3 months.  If light chains continue to increase, we will consider PET CT scan at that point.   2.  Low back pain: - Patient has mild low back pain at site of previous radiation - However, in May 2022 she developed new onset mid back pain, no history of trauma - MRI thoracic spine (10/09/2019) did not show any lesions concerning for bone cancer or malignancy; no evidence of acute fracture.  Back pain is likely secondary to degenerative disc disease as noted on MRI.   - Patient has prescription for hydrocodone 5/325 every 6 hours as needed for pain, which she reports she has been taking anywhere between 2 to 4 per day - PLAN: Continue hydrocodone as needed for low back pain related to malignancy.  If she requires increased dosage of narcotic pain medication, would consider referral to pain management doctor.  3.  Severe hypokalemia: - Labs at last visit showed potassium of 2.7.  Prior to that visit she was found to have hypokalemia while tested in the ER and took 3 days of potassium which she ran out. - She was started on potassium 40 mEq daily at her last visit (May 2022). - PLAN: Continue potassium 40 M EQ daily, repeat CMP in 3 months.   PLAN SUMMARY & DISPOSITION: -Labs in 3 months - RTC for MD visit in 3 months  All questions were answered. The patient knows to call the clinic with any problems, questions or concerns.  Medical decision making: Moderate  Time spent on visit: I spent 20 minutes counseling the patient face to face. The total time spent in the appointment was 30 minutes and more than 50% was on counseling.   Harriett Rush, PA-C  10/26/2020 2:36 PM

## 2020-10-26 ENCOUNTER — Inpatient Hospital Stay (HOSPITAL_COMMUNITY): Payer: HMO | Attending: Hematology | Admitting: Physician Assistant

## 2020-10-26 ENCOUNTER — Other Ambulatory Visit: Payer: Self-pay

## 2020-10-26 VITALS — BP 124/74 | HR 95 | Temp 98.3°F | Resp 16 | Wt 219.0 lb

## 2020-10-26 DIAGNOSIS — E876 Hypokalemia: Secondary | ICD-10-CM | POA: Diagnosis not present

## 2020-10-26 DIAGNOSIS — I5032 Chronic diastolic (congestive) heart failure: Secondary | ICD-10-CM | POA: Diagnosis not present

## 2020-10-26 DIAGNOSIS — Z923 Personal history of irradiation: Secondary | ICD-10-CM | POA: Insufficient documentation

## 2020-10-26 DIAGNOSIS — M545 Low back pain, unspecified: Secondary | ICD-10-CM | POA: Diagnosis not present

## 2020-10-26 DIAGNOSIS — E785 Hyperlipidemia, unspecified: Secondary | ICD-10-CM | POA: Insufficient documentation

## 2020-10-26 DIAGNOSIS — Z79899 Other long term (current) drug therapy: Secondary | ICD-10-CM | POA: Diagnosis not present

## 2020-10-26 DIAGNOSIS — J449 Chronic obstructive pulmonary disease, unspecified: Secondary | ICD-10-CM | POA: Diagnosis not present

## 2020-10-26 DIAGNOSIS — I110 Hypertensive heart disease with heart failure: Secondary | ICD-10-CM | POA: Insufficient documentation

## 2020-10-26 DIAGNOSIS — C9030 Solitary plasmacytoma not having achieved remission: Secondary | ICD-10-CM | POA: Insufficient documentation

## 2020-10-26 DIAGNOSIS — G8929 Other chronic pain: Secondary | ICD-10-CM | POA: Diagnosis not present

## 2020-10-26 MED ORDER — HYDROCODONE-ACETAMINOPHEN 5-325 MG PO TABS: 1.0000 | ORAL_TABLET | Freq: Four times a day (QID) | ORAL | 0 refills | Status: DC | PRN

## 2020-10-26 NOTE — Patient Instructions (Signed)
[place] at [facility] Discharge Instructions  You were seen today by Tarri Abernethy PA-C for your plasmacytoma.    LABS: Return in 3 months for repeat labs  MEDICATIONS: Refill has been sent for hydrocodone/acetaminophen 5 mg / 325 mg, take this every 6 hours as needed for pain, no more than 4 times per day.  FOLLOW-UP APPOINTMENT: Visit with Dr. Delton Coombes in 3 months (about 1 week after labs were checked)   Thank you for choosing [place] at [facility] to provide your oncology and hematology care.  To afford each patient quality time with our provider, please arrive at least 15 minutes before your scheduled appointment time.   If you have a lab appointment with the [facility] please come in thru the Main Entrance and check in at the main information desk.  You need to re-schedule your appointment should you arrive 10 or more minutes late.  We strive to give you quality time with our providers, and arriving late affects you and other patients whose appointments are after yours.  Also, if you no show three or more times for appointments you may be dismissed from the clinic at the providers discretion.     Again, thank you for choosing [facility].  Our hope is that these requests will decrease the amount of time that you wait before being seen by our physicians.       _____________________________________________________________  Should you have questions after your visit to [facility], please contact our office at [phone] and follow the prompts.  Our office hours are 8:00 a.m. and 4:30 p.m. Monday - Friday.  Please note that voicemails left after 4:00 p.m. may not be returned until the following business day.  We are closed weekends and major holidays.  You do have access to a nurse 24-7, just call the main number to the clinic [phone] and do not press any options, hold on the line and a nurse will  answer the phone.    For prescription refill requests, have your pharmacy contact our office and allow 72 hours.    Due to Covid, you will need to wear a mask upon entering the hospital. If you do not have a mask, a mask will be given to you at the Main Entrance upon arrival. For doctor visits, patients may have 1 support person age 1 or older with them. For treatment visits, patients can not have anyone with them due to social distancing guidelines and our immunocompromised population.

## 2020-11-02 ENCOUNTER — Encounter: Payer: Self-pay | Admitting: Orthopedic Surgery

## 2020-11-02 ENCOUNTER — Ambulatory Visit (INDEPENDENT_AMBULATORY_CARE_PROVIDER_SITE_OTHER): Payer: PRIVATE HEALTH INSURANCE | Admitting: Orthopedic Surgery

## 2020-11-02 ENCOUNTER — Other Ambulatory Visit: Payer: Self-pay

## 2020-11-02 VITALS — BP 150/84 | HR 69 | Ht 65.0 in | Wt 222.0 lb

## 2020-11-02 DIAGNOSIS — Z9889 Other specified postprocedural states: Secondary | ICD-10-CM | POA: Diagnosis not present

## 2020-11-02 DIAGNOSIS — M75122 Complete rotator cuff tear or rupture of left shoulder, not specified as traumatic: Secondary | ICD-10-CM

## 2020-11-02 NOTE — Progress Notes (Signed)
FOLLOW UP   Encounter Diagnosis  Name Primary?   S/p left rotator cuff repair distal clavicle resection 07/03/20  Yes     Chief Complaint  Patient presents with   S/P left rotator cuff repair distal clavicle resection    DOS 07/03/20  follow up     Hannah Cooper has had the unfortunate life event that she lost her father  She has been doing a lot of activity but not a lot of exercise  She was released from physical therapy and she may have lost some of the motion that she had actively but her passive motion today is good though painful at 120 degrees of flexion  I advised her to continue with her home exercise program at least 3 times a week and see me in about 2 months

## 2020-11-03 ENCOUNTER — Other Ambulatory Visit: Payer: Self-pay | Admitting: Family Medicine

## 2020-11-03 DIAGNOSIS — Z139 Encounter for screening, unspecified: Secondary | ICD-10-CM

## 2020-11-05 ENCOUNTER — Ambulatory Visit
Admission: RE | Admit: 2020-11-05 | Discharge: 2020-11-05 | Disposition: A | Discharge status: Home / Self Care | Payer: HMO | Source: Ambulatory Visit | Attending: Family Medicine | Admitting: Family Medicine

## 2020-11-05 ENCOUNTER — Other Ambulatory Visit: Payer: Self-pay

## 2020-11-05 DIAGNOSIS — Z139 Encounter for screening, unspecified: Secondary | ICD-10-CM

## 2020-11-09 ENCOUNTER — Other Ambulatory Visit: Payer: Self-pay | Admitting: Family Medicine

## 2020-11-09 DIAGNOSIS — R928 Other abnormal and inconclusive findings on diagnostic imaging of breast: Secondary | ICD-10-CM

## 2020-11-16 DIAGNOSIS — Z0001 Encounter for general adult medical examination with abnormal findings: Secondary | ICD-10-CM | POA: Diagnosis not present

## 2020-11-16 DIAGNOSIS — Z1389 Encounter for screening for other disorder: Secondary | ICD-10-CM | POA: Diagnosis not present

## 2020-11-16 DIAGNOSIS — I10 Essential (primary) hypertension: Secondary | ICD-10-CM | POA: Diagnosis not present

## 2020-11-17 ENCOUNTER — Other Ambulatory Visit (HOSPITAL_COMMUNITY): Payer: Self-pay | Admitting: Family Medicine

## 2020-11-17 DIAGNOSIS — E2839 Other primary ovarian failure: Secondary | ICD-10-CM

## 2020-11-23 ENCOUNTER — Other Ambulatory Visit (HOSPITAL_COMMUNITY): Payer: Self-pay | Admitting: Family Medicine

## 2020-11-23 DIAGNOSIS — R928 Other abnormal and inconclusive findings on diagnostic imaging of breast: Secondary | ICD-10-CM

## 2020-11-24 ENCOUNTER — Other Ambulatory Visit: Payer: Self-pay

## 2020-11-24 ENCOUNTER — Other Ambulatory Visit (HOSPITAL_COMMUNITY): Payer: Self-pay | Admitting: Family Medicine

## 2020-11-24 ENCOUNTER — Ambulatory Visit (HOSPITAL_COMMUNITY)
Admission: RE | Admit: 2020-11-24 | Discharge: 2020-11-24 | Disposition: A | Discharge status: Home / Self Care | Payer: PRIVATE HEALTH INSURANCE | Source: Ambulatory Visit | Attending: Family Medicine | Admitting: Family Medicine

## 2020-11-24 DIAGNOSIS — R928 Other abnormal and inconclusive findings on diagnostic imaging of breast: Secondary | ICD-10-CM | POA: Insufficient documentation

## 2020-11-26 ENCOUNTER — Ambulatory Visit (HOSPITAL_COMMUNITY)
Admission: RE | Admit: 2020-11-26 | Discharge: 2020-11-26 | Disposition: A | Discharge status: Home / Self Care | Payer: HMO | Source: Ambulatory Visit | Attending: Family Medicine | Admitting: Family Medicine

## 2020-11-26 ENCOUNTER — Other Ambulatory Visit: Payer: Self-pay

## 2020-11-26 DIAGNOSIS — Z780 Asymptomatic menopausal state: Secondary | ICD-10-CM | POA: Diagnosis not present

## 2020-11-26 DIAGNOSIS — M85851 Other specified disorders of bone density and structure, right thigh: Secondary | ICD-10-CM | POA: Diagnosis not present

## 2020-11-26 DIAGNOSIS — E2839 Other primary ovarian failure: Secondary | ICD-10-CM | POA: Diagnosis not present

## 2020-12-01 ENCOUNTER — Encounter (HOSPITAL_COMMUNITY): Payer: Self-pay

## 2020-12-01 ENCOUNTER — Other Ambulatory Visit: Payer: Self-pay

## 2020-12-01 ENCOUNTER — Ambulatory Visit (HOSPITAL_COMMUNITY)
Admission: RE | Admit: 2020-12-01 | Discharge: 2020-12-01 | Disposition: A | Discharge status: Home / Self Care | Payer: PRIVATE HEALTH INSURANCE | Source: Ambulatory Visit | Attending: Family Medicine | Admitting: Family Medicine

## 2020-12-01 ENCOUNTER — Other Ambulatory Visit (HOSPITAL_COMMUNITY): Payer: Self-pay | Admitting: Family Medicine

## 2020-12-01 DIAGNOSIS — N6314 Unspecified lump in the right breast, lower inner quadrant: Secondary | ICD-10-CM | POA: Diagnosis not present

## 2020-12-01 DIAGNOSIS — R928 Other abnormal and inconclusive findings on diagnostic imaging of breast: Secondary | ICD-10-CM

## 2020-12-01 MED ORDER — LIDOCAINE-EPINEPHRINE (PF) 2 %-1:200000 IJ SOLN
INTRAMUSCULAR | Status: AC
  Administered 2020-12-01: 10 mL
  Filled 2020-12-01: qty 10

## 2020-12-01 NOTE — Sedation Documentation (Signed)
PT tolerated right breast biopsy well today with NAD noted. PT verbalized understanding of discharge instructions and given ice packs and printed d/c instructions. PT ambulated back to the mammogram area this time.

## 2020-12-02 ENCOUNTER — Ambulatory Visit (INDEPENDENT_AMBULATORY_CARE_PROVIDER_SITE_OTHER): Payer: PRIVATE HEALTH INSURANCE | Admitting: Cardiology

## 2020-12-02 ENCOUNTER — Encounter: Payer: Self-pay | Admitting: *Deleted

## 2020-12-02 ENCOUNTER — Encounter: Payer: Self-pay | Admitting: Cardiology

## 2020-12-02 VITALS — BP 120/80 | HR 52 | Ht 65.0 in | Wt 225.0 lb

## 2020-12-02 DIAGNOSIS — I5032 Chronic diastolic (congestive) heart failure: Secondary | ICD-10-CM

## 2020-12-02 DIAGNOSIS — I10 Essential (primary) hypertension: Secondary | ICD-10-CM

## 2020-12-02 DIAGNOSIS — R002 Palpitations: Secondary | ICD-10-CM

## 2020-12-02 MED ORDER — DILTIAZEM HCL 30 MG PO TABS: 30.0000 mg | ORAL_TABLET | Freq: Two times a day (BID) | ORAL | 3 refills | Status: DC

## 2020-12-02 NOTE — Progress Notes (Signed)
Clinical Summary Hannah Cooper is a 65 y.o.female seen today for follow up of the following medical problems.    1. Palpitations - 2018 monitor PAC and PVCs - has been on diltiazem and lopressor - some ongoing palpitatoins at times - no caffeine, no EtOH. Has had some recent stress due to loss of family members including recently her father.       2. HTN - she is compliant with meds   3. Chest pain - long history of atypical chest pain - cath 2014 with patent coronaries. -  admit 12/2014 with chest pain, negative evaluation for ACS.     - chronic symptoms unchanged since last visit  - 06/2020 no clear ischemia.    4. COPD - noted on 12/2013 PFTs - followed Dr Luan Pulling       6. Chronic diastolic heart failure - echo 06/2013 LVEF 55-60%, abnormal diastolic function.    - no recent edema. Takes lasix prn, about 1-2 times per week. She is on daily chorlthalidone for bp   6. Dyspnea - had CPX 06/2014, showed mild to mod reduced functoinal capacity      7. IgG kappa monoclonal gammopathy and bony lesion - both followed by heme/onc  8. Hypokalemia - she reports addressed by pcp with recent normal labs     SH: works at [employer] for kids. She has 3 adopted kids (9,10,11). Great grand baby is 72 year old   Father just passed away 12/01/2020   Past Medical History:  Diagnosis Date   Arthritis    Atypical chest pain    chronic   Cancer (HCC)    bone cancer   Chronic back pain    Chronic diastolic CHF (congestive heart failure) (HCC)    COPD (chronic obstructive pulmonary disease) (HCC)    DDD (degenerative disc disease), cervical    History of radiation therapy 07/16/19-08/22/19   L4 spine ; Dr. Gery Pray   Hyperlipidemia    Hypertension    Lumbar radiculopathy    Meningitis    Normal coronary arteries 2014   Palpitations    Premature atrial contractions    PVC's (premature ventricular contractions)      Allergies  Allergen Reactions   Other  Swelling    Avon lipstick     Current Outpatient Medications  Medication Sig Dispense Refill   albuterol (PROVENTIL) (2.5 MG/3ML) 0.083% nebulizer solution Take 2.5 mg by nebulization every 6 (six) hours as needed for wheezing or shortness of breath.     albuterol (VENTOLIN HFA) 108 (90 Base) MCG/ACT inhaler Inhale 1-2 puffs into the lungs every 4 (four) hours as needed for wheezing or shortness of breath. 18 g 0   Apoaequorin (PREVAGEN) 10 MG CAPS Take 10 mg by mouth daily.     chlorthalidone (HYGROTON) 50 MG tablet Take 1 tablet (50 mg total) by mouth daily. 90 tablet 3   Cholecalciferol (DIALYVITE VITAMIN D 5000) 125 MCG (5000 UT) capsule Take 5,000 Units by mouth daily.     cycloSPORINE (RESTASIS) 0.05 % ophthalmic emulsion Place 1 drop into both eyes 2 (two) times daily.     diltiazem (CARDIZEM) 30 MG tablet Take 1 tablet by mouth twice daily 180 tablet 3   furosemide (LASIX) 20 MG tablet Take 1 tablet daily as needed may take 2 tablets prn (Patient taking differently: Take 20-40 mg by mouth daily as needed for edema.) 60 tablet 1   glipiZIDE-metformin (METAGLIP) 5-500 MG tablet Take 1 tablet  by mouth in the morning and at bedtime.     HYDROcodone-acetaminophen (NORCO/VICODIN) 5-325 MG tablet Take 1 tablet by mouth every 6 (six) hours as needed for moderate pain. 60 tablet 0   ibuprofen (ADVIL) 800 MG tablet Take 1 tablet (800 mg total) by mouth every 8 (eight) hours as needed. 90 tablet 1   Insulin Glargine (BASAGLAR KWIKPEN) 100 UNIT/ML Inject 15 Units into the skin at bedtime as needed (blood sugar over 150).     isosorbide mononitrate (IMDUR) 30 MG 24 hr tablet TAKE 1 TABLET (30 MG TOTAL) BY MOUTH DAILY. (Patient taking differently: Take 30 mg by mouth daily.) 90 tablet 0   lidocaine (LIDODERM) 5 % Place 2 patches onto the skin daily. Remove & Discard patch within 12 hours or as directed by MD 60 patch 1   losartan (COZAAR) 100 MG tablet Take 100 mg by mouth daily.     metFORMIN  (GLUCOPHAGE) 500 MG tablet Take 500 mg by mouth 2 (two) times daily.     metoprolol tartrate (LOPRESSOR) 100 MG tablet Take 100 mg by mouth 2 (two) times daily.     Multiple Vitamin (MULTIVITAMIN WITH MINERALS) TABS tablet Take 1 tablet by mouth daily.     mupirocin ointment (BACTROBAN) 2 % Apply 1 application topically 3 (three) times daily as needed (wound care).     ondansetron (ZOFRAN) 4 MG tablet Take 1 tablet (4 mg total) by mouth every 8 (eight) hours as needed for nausea or vomiting. 12 tablet 0   potassium chloride SA (KLOR-CON) 20 MEQ tablet Take 2 tablets (40 mEq total) by mouth daily. 60 tablet 3   RELION PEN NEEDLES 31G X 6 MM MISC USE 1 PEN NEEDLE TO INJECT INSULIN AT BEDTIME     RELION PEN NEEDLES 32G X 4 MM MISC      tiZANidine (ZANAFLEX) 4 MG tablet Take 1 tablet (4 mg total) by mouth daily. 30 tablet 1   TURMERIC PO Take 1,000 mg by mouth daily.     No current facility-administered medications for this visit.     Past Surgical History:  Procedure Laterality Date   ABDOMINAL HYSTERECTOMY     IR FLUORO GUIDED NEEDLE PLC ASPIRATION/INJECTION LOC  06/19/2019   LEFT HEART CATHETERIZATION WITH CORONARY ANGIOGRAM N/A 09/05/2012   Procedure: LEFT HEART CATHETERIZATION WITH CORONARY ANGIOGRAM;  Surgeon: Wellington Hampshire, MD;  Location: [facility];  Service: Cardiovascular;  Laterality: N/A;   RESECTION DISTAL CLAVICAL Left 07/03/2020   Procedure: RESECTION DISTAL CLAVICAL;  Surgeon: Carole Civil, MD;  Location: [facility];  Service: Orthopedics;  Laterality: Left;   SHOULDER OPEN ROTATOR CUFF REPAIR Left 07/03/2020   Procedure: ROTATOR CUFF REPAIR SHOULDER OPEN WITH CHROMEOPLASTY;  Surgeon: Carole Civil, MD;  Location: [facility];  Service: Orthopedics;  Laterality: Left;   THYROID SURGERY  2002     Allergies  Allergen Reactions   Other Swelling    Avon lipstick      Family History  Problem Relation Age of Onset   Heart attack Mother 68   Heart attack Sister 73    Breast cancer Maternal Grandmother    Breast cancer Cousin      Social History Hannah Cooper reports that she has never smoked. She has never used smokeless tobacco. Hannah Cooper reports no history of alcohol use.   Review of Systems CONSTITUTIONAL: No weight loss, fever, chills, weakness or fatigue.  HEENT: Eyes: No visual loss, blurred vision, double vision or yellow sclerae.No  hearing loss, sneezing, congestion, runny nose or sore throat.  SKIN: No rash or itching.  CARDIOVASCULAR: per hpi RESPIRATORY: No shortness of breath, cough or sputum.  GASTROINTESTINAL: No anorexia, nausea, vomiting or diarrhea. No abdominal pain or blood.  GENITOURINARY: No burning on urination, no polyuria NEUROLOGICAL: No headache, dizziness, syncope, paralysis, ataxia, numbness or tingling in the extremities. No change in bowel or bladder control.  MUSCULOSKELETAL: No muscle, back pain, joint pain or stiffness.  LYMPHATICS: No enlarged nodes. No history of splenectomy.  PSYCHIATRIC: No history of depression or anxiety.  ENDOCRINOLOGIC: No reports of sweating, cold or heat intolerance. No polyuria or polydipsia.  Marland Kitchen   Physical Examination Today's Vitals   12/02/20 1054  BP: 120/80  Pulse: (!) 52  SpO2: 98%  Weight: 225 lb (102.1 kg)  Height: '5\' 5"'$  (1.651 m)   Body mass index is 37.44 kg/m.  Gen: resting comfortably, no acute distress HEENT: no scleral icterus, pupils equal round and reactive, no palptable cervical adenopathy,  CV: RRR, no m/r/g no jvd Resp: Clear to auscultation bilaterally GI: abdomen is soft, non-tender, non-distended, normal bowel sounds, no hepatosplenomegaly MSK: extremities are warm, no edema.  Skin: warm, no rash Neuro:  no focal deficits Psych: appropriate affect   Diagnostic Studies    08/2012 cath Hemodynamics: AO:  164/82   mmHg LV:  169/14    mmHg LVEDP: 24  mmHg   Coronary angiography: Coronary dominance: Right    Left Main:  Normal Left Anterior  Descending (LAD):  Normal in size with no significant disease. 1st diagonal (D1):  Small in size with minor irregularities. 2nd diagonal (D2):  Normal in size with no significant disease. 3rd diagonal (D3):  Normal in size with no significant disease. Circumflex (LCx):  Normal in size and nondominant. The vessel has no significant disease. 1st obtuse marginal:  Small in size with no significant disease. 2nd obtuse marginal:  Small in size with no significant disease.  3rd obtuse marginal:  Large in size with no significant disease.   Right Coronary Artery: Normal in size and dominant. The vessel has no significant disease. posterior descending artery: Normal in size with no significant disease. posterior lateral branchs:  Normal in size with no significant disease.   Left ventriculography: Left ventricular systolic function is normal , LVEF is estimated at 60-65% %, there is no significant mitral regurgitation    Final Conclusions:    1. Normal coronary arteries. 2. Normal LV systolic function. 3. Moderately elevated left ventricular end-diastolic pressure likely due to diastolic heart failure.     06/2013 echo Study Conclusions  - Procedure narrative: Transthoracic echocardiography. Image   quality was suboptimal. The study was technically   difficult, as a result of poor sound wave transmission and   restricted patient mobility. - Left ventricle: The cavity size was normal. Wall thickness   was increased in a pattern of mild to moderate LVH.   Diastolic dysfunction noted, grade indeterminant. Systolic   function was normal. The estimated ejection fraction was   in the range of 55% to 60%. Wall motion was normal; there   were no regional wall motion abnormalities.     Jan 2017 Event monitor: no arrhythmias   06/2014 CPX Conclusion: Exercise testing with gas exchange demonstrates a mild-moderately reduced functional capacity when compared to matched sedentary norms. There was an  in-adequate HR response to the exercise response and flat O2 pulse. This high HR reserve was likely high due to pulmonary limitations based on  restrictive spirometry and RR 45 at peak exercise and reaching maximum ventilatory limits. However, it could have easily been due to poor effort. The sub-maximal effort is clouding interpretation of the test. Nevertheless, correlation of resting spirometry test  Showing restriction with outpatient pulmonary function test is warranted     05/2016 heart monitor Telemetry tracings predominately show sinus rhythm Reported symptoms correlate with occasional PVCs and PACs       02/2016 echo Study Conclusions   - Left ventricle: The cavity size was normal. Systolic function was   vigorous. The estimated ejection fraction was in the range of 65%   to 70%. Wall motion was normal; there were no regional wall   motion abnormalities. There was an increased relative   contribution of atrial contraction to ventricular filling.   Doppler parameters are consistent with abnormal left ventricular   relaxation (grade 1 diastolic dysfunction). - Pulmonic valve: There was trivial regurgitation. - Pulmonary arteries: Systolic pressure could not be accurately   estimated.   Assessment and Plan  1. Palpitations - symptoms reasonably controlled, low HRs today cannot titrate her daily av nodal agents. Will write dilt as '30mg'$  bid may take additional tablet prn     2.HTN - at goal, continue current meds  3. Chronic diastolic HF - euvolemic today without symptoms, conitnue current meds     Arnoldo Lenis, M.D.

## 2020-12-02 NOTE — Patient Instructions (Addendum)
Medication Instructions:  May take an additional tab of the Diltiazem '30mg'$  as needed for palpitations.  Continue all other medications.     Labwork: none  Testing/Procedures: none  Follow-Up: 6 months   Any Other Special Instructions Will Be Listed Below (If Applicable).    If you need a refill on your cardiac medications before your next appointment, please call your pharmacy.

## 2020-12-03 LAB — SURGICAL PATHOLOGY

## 2020-12-31 ENCOUNTER — Other Ambulatory Visit (HOSPITAL_COMMUNITY): Payer: Self-pay | Admitting: *Deleted

## 2020-12-31 DIAGNOSIS — M545 Low back pain, unspecified: Secondary | ICD-10-CM

## 2020-12-31 MED ORDER — HYDROCODONE-ACETAMINOPHEN 5-325 MG PO TABS: 1.0000 | ORAL_TABLET | Freq: Four times a day (QID) | ORAL | 0 refills | Status: DC | PRN

## 2021-01-04 ENCOUNTER — Ambulatory Visit (INDEPENDENT_AMBULATORY_CARE_PROVIDER_SITE_OTHER): Payer: HMO | Admitting: Orthopedic Surgery

## 2021-01-04 ENCOUNTER — Other Ambulatory Visit: Payer: Self-pay

## 2021-01-04 ENCOUNTER — Encounter: Payer: Self-pay | Admitting: Orthopedic Surgery

## 2021-01-04 VITALS — BP 119/78 | HR 61 | Ht 65.0 in | Wt 225.0 lb

## 2021-01-04 DIAGNOSIS — G8929 Other chronic pain: Secondary | ICD-10-CM

## 2021-01-04 DIAGNOSIS — M25512 Pain in left shoulder: Secondary | ICD-10-CM

## 2021-01-04 DIAGNOSIS — Z9889 Other specified postprocedural states: Secondary | ICD-10-CM | POA: Diagnosis not present

## 2021-01-04 MED ORDER — IBUPROFEN 800 MG PO TABS
800.0000 mg | ORAL_TABLET | Freq: Three times a day (TID) | ORAL | 1 refills | Status: AC | PRN
Start: 2021-01-04 — End: ?

## 2021-01-04 NOTE — Progress Notes (Signed)
Chief Complaint  Patient presents with   Shoulder Problem    SURGERY 07/03/20    65 year old female 26-monthstatus post rotator cuff repair distal clavicle excision presents with pain left shoulder and weakness  Patient says she was doing very well until she reached for her grandchild who was falling off the bed and since that time she has had increased pain decreased range of motion and weakness in the left shoulder.  She has tried to do some of her exercises on occasion  Exam of the left shoulder shows tenderness posteriorly laterally and anteriorly she has decreased active range of motion she has abduction weakness  Recommend she try a home exercise program she did not want an injection so Ragona try ibuprofen 803 times a day  Patient is diabetic  Recommended 6-week program of home exercise including stretching strengthening and then if no improvement will probably need a repeat MRI with contrast  Meds ordered this encounter  Medications   ibuprofen (ADVIL) 800 MG tablet    Sig: Take 1 tablet (800 mg total) by mouth every 8 (eight) hours as needed.    Dispense:  90 tablet    Refill:  1

## 2021-01-05 ENCOUNTER — Encounter: Payer: Self-pay | Admitting: General Surgery

## 2021-01-05 ENCOUNTER — Other Ambulatory Visit (HOSPITAL_COMMUNITY): Payer: Self-pay | Admitting: General Surgery

## 2021-01-05 ENCOUNTER — Ambulatory Visit: Payer: HMO | Admitting: General Surgery

## 2021-01-05 VITALS — BP 102/65 | HR 80 | Temp 98.3°F | Resp 14 | Ht 65.0 in | Wt 223.0 lb

## 2021-01-05 DIAGNOSIS — R928 Other abnormal and inconclusive findings on diagnostic imaging of breast: Secondary | ICD-10-CM

## 2021-01-05 DIAGNOSIS — D241 Benign neoplasm of right breast: Secondary | ICD-10-CM | POA: Diagnosis not present

## 2021-01-05 MED ORDER — ALPRAZOLAM 1 MG PO TABS: 1.0000 mg | ORAL_TABLET | ORAL | 0 refills | Status: DC

## 2021-01-05 NOTE — Progress Notes (Signed)
[facility] History and Physical  Reason for Referral: Intraductal papilloma right breast   Referring Physician: [place]   Chief Complaint   New Patient (Initial Visit)     Hannah Cooper is a 65 y.o. female.  HPI: Hannah Cooper is a very sweet 65 yo who has a history of COPD, PVCs, HTN, some atypical chest pain in the past with catheterization in 2014 that was negative. She had her normal screening mammogram and was called back for an area on the right side. This has been biopsied and came back as intraductal papilloma which was concordant with the imaging.   The patient has no history of any masses, lumps, bumps, nipple changes or discharge. She had menarche at age 81. She underwent a hysterectomy at age 35 and said that she also had a left breast cyst excised but nothing was cancerous.  She has a very distant history of any family breast cancer in 2nd and 3rd cousins. She has never had any previous biopsies or concerning areas on mammogram.  She has not had any chest radiation.  She is very nervous about needles and the biopsy was very difficult for her.    Past Medical History:  Diagnosis Date   Arthritis    Atypical chest pain    chronic   Cancer (HCC)    bone cancer   Chronic back pain    Chronic diastolic CHF (congestive heart failure) (HCC)    COPD (chronic obstructive pulmonary disease) (HCC)    DDD (degenerative disc disease), cervical    History of radiation therapy 07/16/19-08/22/19   L4 spine ; Dr. Gery Pray   Hyperlipidemia    Hypertension    Lumbar radiculopathy    Meningitis    Normal coronary arteries 2014   Palpitations    Premature atrial contractions    PVC's (premature ventricular contractions)     Past Surgical History:  Procedure Laterality Date   ABDOMINAL HYSTERECTOMY     IR FLUORO GUIDED NEEDLE PLC ASPIRATION/INJECTION LOC  06/19/2019   LEFT HEART CATHETERIZATION WITH CORONARY ANGIOGRAM N/A 09/05/2012   Procedure: LEFT  HEART CATHETERIZATION WITH CORONARY ANGIOGRAM;  Surgeon: Wellington Hampshire, MD;  Location: [facility];  Service: Cardiovascular;  Laterality: N/A;   RESECTION DISTAL CLAVICAL Left 07/03/2020   Procedure: RESECTION DISTAL CLAVICAL;  Surgeon: Carole Civil, MD;  Location: [facility];  Service: Orthopedics;  Laterality: Left;   SHOULDER OPEN ROTATOR CUFF REPAIR Left 07/03/2020   Procedure: ROTATOR CUFF REPAIR SHOULDER OPEN WITH CHROMEOPLASTY;  Surgeon: Carole Civil, MD;  Location: [facility];  Service: Orthopedics;  Laterality: Left;   THYROID SURGERY  2002    Family History  Problem Relation Age of Onset   Heart attack Mother 65   Heart attack Sister 95   Breast cancer Maternal Grandmother    Breast cancer Cousin     Social History   Tobacco Use   Smoking status: Never   Smokeless tobacco: Never  Vaping Use   Vaping Use: Never used  Substance Use Topics   Alcohol use: No    Alcohol/week: 0.0 standard drinks   Drug use: No    Medications: I have reviewed the patient's current medications. Allergies as of 01/05/2021       Reactions   Other Swelling   Avon lipstick        Medication List        Accurate as of January 05, 2021  9:59 AM. If you have any  questions, ask your nurse or doctor.          albuterol (2.5 MG/3ML) 0.083% nebulizer solution Commonly known as: PROVENTIL Take 2.5 mg by nebulization every 6 (six) hours as needed for wheezing or shortness of breath.   albuterol 108 (90 Base) MCG/ACT inhaler Commonly known as: VENTOLIN HFA Inhale 1-2 puffs into the lungs every 4 (four) hours as needed for wheezing or shortness of breath.   Basaglar KwikPen 100 UNIT/ML Inject 15 Units into the skin at bedtime as needed (blood sugar over 150).   chlorthalidone 50 MG tablet Commonly known as: HYGROTON Take 1 tablet (50 mg total) by mouth daily.   cycloSPORINE 0.05 % ophthalmic emulsion Commonly known as: RESTASIS Place 1 drop into both eyes 2 (two) times  daily.   Dialyvite Vitamin D 5000 125 MCG (5000 UT) capsule Generic drug: Cholecalciferol Take 5,000 Units by mouth daily.   diltiazem 30 MG tablet Commonly known as: CARDIZEM Take 1 tablet (30 mg total) by mouth 2 (two) times daily. (MAY TAKE AN ADDITIONAL TAB AS NEEDED FOR PALPITATIONS)   furosemide 20 MG tablet Commonly known as: LASIX Take 1 tablet daily as needed may take 2 tablets prn What changed:  how much to take how to take this when to take this reasons to take this additional instructions   glipiZIDE-metformin 5-500 MG tablet Commonly known as: METAGLIP Take 1 tablet by mouth in the morning and at bedtime.   HYDROcodone-acetaminophen 5-325 MG tablet Commonly known as: NORCO/VICODIN Take 1 tablet by mouth every 6 (six) hours as needed for moderate pain.   ibuprofen 800 MG tablet Commonly known as: ADVIL Take 1 tablet (800 mg total) by mouth every 8 (eight) hours as needed.   isosorbide mononitrate 30 MG 24 hr tablet Commonly known as: IMDUR TAKE 1 TABLET (30 MG TOTAL) BY MOUTH DAILY.   lidocaine 5 % Commonly known as: LIDODERM Place 2 patches onto the skin daily. Remove & Discard patch within 12 hours or as directed by MD   losartan 100 MG tablet Commonly known as: COZAAR Take 100 mg by mouth daily.   metoprolol tartrate 100 MG tablet Commonly known as: LOPRESSOR Take 100 mg by mouth 2 (two) times daily.   multivitamin with minerals Tabs tablet Take 1 tablet by mouth daily.   mupirocin ointment 2 % Commonly known as: BACTROBAN Apply 1 application topically 3 (three) times daily as needed (wound care).   ondansetron 4 MG tablet Commonly known as: ZOFRAN Take 1 tablet (4 mg total) by mouth every 8 (eight) hours as needed for nausea or vomiting.   potassium chloride SA 20 MEQ tablet Commonly known as: KLOR-CON Take 2 tablets (40 mEq total) by mouth daily.   Prevagen 10 MG Caps Generic drug: Apoaequorin Take 10 mg by mouth daily.   ReliOn Pen  Needles 31G X 6 MM Misc Generic drug: Insulin Pen Needle USE 1 PEN NEEDLE TO INJECT INSULIN AT BEDTIME   ReliOn Pen Needles 32G X 4 MM Misc Generic drug: Insulin Pen Needle   tiZANidine 4 MG tablet Commonly known as: Zanaflex Take 1 tablet (4 mg total) by mouth daily.   TURMERIC PO Take 1,000 mg by mouth daily.         ROS:  A comprehensive review of systems was negative except for: Eyes: positive for blurred vision Musculoskeletal: positive for back pain Endocrine: positive for diabetes  Blood pressure 102/65, pulse 80, temperature 98.3 F (36.8 C), temperature source Oral, resp. rate 14,  height 5\' 5"  (1.651 m), weight 223 lb (101.2 kg). Physical Exam Vitals reviewed.  Constitutional:      Appearance: Normal appearance.  HENT:     Head: Normocephalic.     Nose: Nose normal.     Mouth/Throat:     Mouth: Mucous membranes are moist.  Eyes:     Extraocular Movements: Extraocular movements intact.  Cardiovascular:     Rate and Rhythm: Normal rate and regular rhythm.  Pulmonary:     Effort: Pulmonary effort is normal.     Breath sounds: Normal breath sounds.  Chest:  Breasts:    Right: No mass, nipple discharge, skin change or tenderness.     Left: No mass, nipple discharge, skin change or tenderness.  Abdominal:     General: There is no distension.     Palpations: Abdomen is soft.     Tenderness: There is no abdominal tenderness.  Musculoskeletal:        General: Normal range of motion.  Lymphadenopathy:     Upper Body:     Right upper body: No axillary adenopathy.     Left upper body: No axillary adenopathy.  Skin:    General: Skin is warm.  Neurological:     General: No focal deficit present.     Mental Status: She is alert and oriented to person, place, and time.  Psychiatric:        Mood and Affect: Mood normal.        Behavior: Behavior normal.        Thought Content: Thought content normal.    Results: ADDENDUM REPORT: 12/07/2020 14:55    ADDENDUM: PATHOLOGY revealed: A. BREAST, 3:00, RIGHT, BIOPSY: - Intraductal papilloma with florid usual ductal hyperplasia and sclerosing fibrosis, see comment. COMMENT: Immunohistochemical stain for CK5/6 does not show evidence of atypical hyperplasia.   Pathology results are CONCORDANT with imaging findings, per Dr. Everlean Alstrom with excision recommended.   Pathology results and recommendations were discussed with patient via telephone on 12/07/2020. Patient reported doing well after the biopsy with no adverse symptoms, and only slight tenderness at the site. Post biopsy care instructions were reviewed, questions were answered and my direct phone number was provided. Patient was instructed to call [facility] Mammography Department for any additional questions or concerns related to biopsy site.   Recommend surgical consultation for consideration of excision. Request for surgical consultation was relayed to Kathi Der RT at [facility] Mammography Department by Electa Sniff RN on 12/07/2020.   Pathology results reported by Electa Sniff RN on 12/07/2020.     Electronically Signed   By: Everlean Alstrom M.D.   On: 12/07/2020 14:55  CLINICAL DATA:  Screening recall for a possible right breast mass.   EXAM: DIGITAL DIAGNOSTIC UNILATERAL RIGHT MAMMOGRAM WITH TOMOSYNTHESIS AND CAD; ULTRASOUND RIGHT BREAST LIMITED   TECHNIQUE: Right digital diagnostic mammography and breast tomosynthesis was performed. The images were evaluated with computer-aided detection.; Targeted ultrasound examination of the right breast was performed   COMPARISON:  Previous exam(s).   ACR Breast Density Category b: There are scattered areas of fibroglandular density.   FINDINGS: The possible mass, noted in the lower inner quadrant of the right breast the current screening study, persists on diagnostic imaging as a 8-9 mm mass with partly circumscribed partly  ill-defined margins and the suggestion associated architectural distortion. This projects at approximately 4 o'clock in the right breast, middle depth.   Targeted right breast ultrasound is performed, showing  a hypoechoic mass with mildly lobulated, mostly circumscribed margins, at 3 o'clock, retroareolar, measuring 9 x 7 x 9 mm, consistent in size, shape and location to the mammographic mass. Subtle associated architectural distortion is suggested on the radial views. Sonographic evaluation of the right axilla shows no enlarged or abnormal lymph nodes.   IMPRESSION: 1. Suspicious 9 mm mass in the 3 o'clock, retroareolar right breast. Tissue sampling is recommended. No evidence of right axillary lymphadenopathy.   RECOMMENDATION: 1. Ultrasound-guided core needle biopsy of the right breast mass. This has been scheduled for December 01, 2020 at 1 p.m.   I have discussed the findings and recommendations with the patient. If applicable, a reminder letter will be sent to the patient regarding the next appointment.   BI-RADS CATEGORY  4: Suspicious.     Electronically Signed   By: Lajean Manes M.D.   On: 11/24/2020 09:34     Assessment & Plan:  DENISIA HARPOLE is a 65 y.o. female with a right breast intraductal papilloma that was found on imaging and biopsy. Discussed that this could harbor atypical or cancerous cells.   Discussed excisional biopsy after tag placement. Discussed risk of bleeding, infection, cosmetic changes, needing more surgery, finding cancer.    She will take Xanax before her tag placement to help with her anxiety. Xanax 1-2 mg before the procedure prescribed.   All questions were answered to the satisfaction of the patient.   Virl Cagey 01/05/2021, 9:59 AM

## 2021-01-05 NOTE — Patient Instructions (Signed)
Breast Biopsy A breast biopsy is a procedure in which a sample of breast tissue is removed from the breast and examined under a microscope to see if cancerous cells are present. You may need a breast biopsy if you have: Any undiagnosed breast mass (tumor). Nipple abnormalities, dimpling, crusting, or ulcerations. Abnormal discharge from the nipple, especially blood. Redness, swelling, and pain of the breast. Calcium deposits (calcifications) or abnormalities seen on a mammogram, ultrasound results, or MRI results. Abnormal changes in the breast seen on your mammogram. If the breast abnormality is found to be cancerous (malignant), a breast biopsy can help to determine what the best treatment is for you. There are many different types of breast biopsies. Talk with your health care provider about your options and which type is best for you. Tell a health care provider about: Any allergies you have. All medicines you are taking, including vitamins, herbs, eye drops, creams, and over-the-counter medicines. Any problems you or family members have had with anesthetic medicines. Any blood disorders you have. Any surgeries you have had. Any medical conditions you have. Whether you are pregnant or may be pregnant. What are the risks? Generally, this is a safe procedure. However, problems may occur, including: Discomfort. This is temporary. Bruising and swelling of the breast. Changes in the shape of the breast. Bleeding. Infection. Damage to other tissues. Allergic reactions to medicines. Needing more surgery. What happens before the procedure? Medicines Ask your health care provider about: Changing or stopping your regular medicines. This is especially important if you are taking diabetes medicines or blood thinners. Taking medicines such as aspirin and ibuprofen. These medicines can thin your blood. Do not take these medicines unless your health care provider tells you to take them. Taking  over-the-counter medicines, vitamins, herbs, and supplements. Lifestyle Do not use any products that contain nicotine or tobacco, such as cigarettes, e-cigarettes, and chewing tobacco. If you need help quitting, ask your health care provider. Do not drink alcohol for 24 hours before the procedure. Wear a good support bra to the procedure. Eating and drinking restrictions Talk to your health care provider about when you should stop eating and drinking. You may be asked not to drink or eat for 2-8 hours before the breast biopsy. In some cases, you may be allowed to eat a light breakfast. General instructions Plan to have someone take you home from the hospital or clinic. Ask your health care provider how your surgical site will be marked or identified. Ask your health care provider what steps will be taken to help prevent infection. These may include: Removing hair at the surgery site. Washing skin with a germ-killing soap. Your health care provider may do a procedure to locate and mark the tumor area in your breast (localization). This will help guide your surgeon to where the biopsy or incision is made. This may be done with: Imaging, such as a mammogram, ultrasound, or MRI. Insertion of special wire, clip, seed, or radar reflector implant in the tumor area. What happens during the procedure?  You may be given one or more of the following: A medicine to numb the breast area (local anesthetic). A medicine to help you relax (sedative). A medicine to make you fall asleep (general anesthetic). Your health care provider will perform the biopsy using only one of the following methods. He or she will do: Fine needle aspiration. A thin needle with a syringe will be inserted into a breast cyst. Fluid and cells will be removed. Core  needle biopsy. A wide, hollow needle (core needle) will be inserted into a breast lump multiple times to remove tissue samples or cores. Stereotactic biopsy. X-rays and  a computer will be used to locate the breast lump. The surgeon will use the X-ray images to collect several samples of tissue using a needle. Vacuum-assisted biopsy. A small incision will be made in your breast. A hollow needle and vacuum will be passed through the incision and into the breast tissue. The vacuum will gently draw abnormal breast tissue into the needle to remove it. Ultrasound-guided core needle biopsy. An ultrasound will be used to help guide the core needle to the area of the mass or abnormality. An incision will be made to insert the needle. Then tissue samples will be removed. Surgical biopsy. An incision will be made in the breast to remove part or all of the abnormal tissue. After the tissue is removed, the skin over the area will be closed with sutures and covered with a dressing. There are two types of surgical biopsies: Incisional biopsy. The surgeon will remove part of the breast lump. Excisional biopsy. The surgeon will attempt to remove the whole breast lump or as much of it as possible. After any of these procedures, the tissue or fluid that was removed will be examined under a microscope. The procedure may vary among health care providers and hospitals. What happens after the procedure? You will be taken to the recovery area. If you are doing well and have no problems, you will be allowed to go home. You may have a pressure dressing applied on your breast for 24-48 hours. You may also be advised to wear a supportive bra during this time. Do not drive for 24 hours if you were given a sedative during your procedure. Summary A breast biopsy is a procedure in which a sample of breast tissue is removed from the breast and examined under a microscope to see if cancerous cells are present. This is a safe procedure, but problems can occur, including bleeding, infection, pain, and bruising. Ask your health care provider about changing or stopping your regular medicines. Plan to  have someone take you home from the hospital or clinic. This information is not intended to replace advice given to you by your health care provider. Make sure you discuss any questions you have with your health care provider. Document Revised: 12/16/2019 Document Reviewed: 09/20/2017 Elsevier Patient Education  [place].

## 2021-01-07 NOTE — H&P (Signed)
[facility] History and Physical   Reason for Referral: Intraductal papilloma right breast   Referring Physician: Lubeck    Chief Complaint   New Patient (Initial Visit)        Hannah Cooper is a 65 y.o. female.  HPI: Hannah Cooper is a very sweet 65 yo who has a history of COPD, PVCs, HTN, some atypical chest pain in the past with catheterization in 2014 that was negative. She had her normal screening mammogram and was called back for an area on the right side. This has been biopsied and came back as intraductal papilloma which was concordant with the imaging.    The patient has no history of any masses, lumps, bumps, nipple changes or discharge. She had menarche at age 42. She underwent a hysterectomy at age 46 and said that she also had a left breast cyst excised but nothing was cancerous.  She has a very distant history of any family breast cancer in 2nd and 3rd cousins. She has never had any previous biopsies or concerning areas on mammogram.  She has not had any chest radiation.   She is very nervous about needles and the biopsy was very difficult for her.          Past Medical History:  Diagnosis Date   Arthritis     Atypical chest pain      chronic   Cancer (HCC)      bone cancer   Chronic back pain     Chronic diastolic CHF (congestive heart failure) (HCC)     COPD (chronic obstructive pulmonary disease) (HCC)     DDD (degenerative disc disease), cervical     History of radiation therapy 07/16/19-08/22/19    L4 spine ; Dr. Gery Pray   Hyperlipidemia     Hypertension     Lumbar radiculopathy     Meningitis     Normal coronary arteries 2014   Palpitations     Premature atrial contractions     PVC's (premature ventricular contractions)             Past Surgical History:  Procedure Laterality Date   ABDOMINAL HYSTERECTOMY       IR FLUORO GUIDED NEEDLE PLC ASPIRATION/INJECTION LOC   06/19/2019   LEFT HEART CATHETERIZATION WITH CORONARY  ANGIOGRAM N/A 09/05/2012    Procedure: LEFT HEART CATHETERIZATION WITH CORONARY ANGIOGRAM;  Surgeon: Wellington Hampshire, MD;  Location: [facility];  Service: Cardiovascular;  Laterality: N/A;   RESECTION DISTAL CLAVICAL Left 07/03/2020    Procedure: RESECTION DISTAL CLAVICAL;  Surgeon: Carole Civil, MD;  Location: [facility];  Service: Orthopedics;  Laterality: Left;   SHOULDER OPEN ROTATOR CUFF REPAIR Left 07/03/2020    Procedure: ROTATOR CUFF REPAIR SHOULDER OPEN WITH CHROMEOPLASTY;  Surgeon: Carole Civil, MD;  Location: [facility];  Service: Orthopedics;  Laterality: Left;   THYROID SURGERY   2002           Family History  Problem Relation Age of Onset   Heart attack Mother 82   Heart attack Sister 42   Breast cancer Maternal Grandmother     Breast cancer Cousin        Social History         Tobacco Use   Smoking status: Never   Smokeless tobacco: Never  Vaping Use   Vaping Use: Never used  Substance Use Topics   Alcohol use: No      Alcohol/week: 0.0 standard drinks  Drug use: No      Medications: I have reviewed the patient's current medications. Allergies as of 01/05/2021         Reactions    Other Swelling    [place] lipstick            Medication List           Accurate as of January 05, 2021  9:59 AM. If you have any questions, ask your nurse or doctor.              albuterol (2.5 MG/3ML) 0.083% nebulizer solution Commonly known as: PROVENTIL Take 2.5 mg by nebulization every 6 (six) hours as needed for wheezing or shortness of breath.    albuterol 108 (90 Base) MCG/ACT inhaler Commonly known as: VENTOLIN HFA Inhale 1-2 puffs into the lungs every 4 (four) hours as needed for wheezing or shortness of breath.    Basaglar KwikPen 100 UNIT/ML Inject 15 Units into the skin at bedtime as needed (blood sugar over 150).    chlorthalidone 50 MG tablet Commonly known as: HYGROTON Take 1 tablet (50 mg total) by mouth daily.    cycloSPORINE 0.05 %  ophthalmic emulsion Commonly known as: RESTASIS Place 1 drop into both eyes 2 (two) times daily.    Dialyvite Vitamin D 5000 125 MCG (5000 UT) capsule Generic drug: Cholecalciferol Take 5,000 Units by mouth daily.    diltiazem 30 MG tablet Commonly known as: CARDIZEM Take 1 tablet (30 mg total) by mouth 2 (two) times daily. (MAY TAKE AN ADDITIONAL TAB AS NEEDED FOR PALPITATIONS)    furosemide 20 MG tablet Commonly known as: LASIX Take 1 tablet daily as needed may take 2 tablets prn What changed:  how much to take how to take this when to take this reasons to take this additional instructions    glipiZIDE-metformin 5-500 MG tablet Commonly known as: METAGLIP Take 1 tablet by mouth in the morning and at bedtime.    HYDROcodone-acetaminophen 5-325 MG tablet Commonly known as: NORCO/VICODIN Take 1 tablet by mouth every 6 (six) hours as needed for moderate pain.    ibuprofen 800 MG tablet Commonly known as: ADVIL Take 1 tablet (800 mg total) by mouth every 8 (eight) hours as needed.    isosorbide mononitrate 30 MG 24 hr tablet Commonly known as: IMDUR TAKE 1 TABLET (30 MG TOTAL) BY MOUTH DAILY.    lidocaine 5 % Commonly known as: LIDODERM Place 2 patches onto the skin daily. Remove & Discard patch within 12 hours or as directed by MD    losartan 100 MG tablet Commonly known as: COZAAR Take 100 mg by mouth daily.    metoprolol tartrate 100 MG tablet Commonly known as: LOPRESSOR Take 100 mg by mouth 2 (two) times daily.    multivitamin with minerals Tabs tablet Take 1 tablet by mouth daily.    mupirocin ointment 2 % Commonly known as: BACTROBAN Apply 1 application topically 3 (three) times daily as needed (wound care).    ondansetron 4 MG tablet Commonly known as: ZOFRAN Take 1 tablet (4 mg total) by mouth every 8 (eight) hours as needed for nausea or vomiting.    potassium chloride SA 20 MEQ tablet Commonly known as: KLOR-CON Take 2 tablets (40 mEq total) by  mouth daily.    Prevagen 10 MG Caps Generic drug: Apoaequorin Take 10 mg by mouth daily.    ReliOn Pen Needles 31G X 6 MM Misc Generic drug: Insulin Pen Needle USE 1 PEN NEEDLE  TO INJECT INSULIN AT BEDTIME    ReliOn Pen Needles 32G X 4 MM Misc Generic drug: Insulin Pen Needle    tiZANidine 4 MG tablet Commonly known as: Zanaflex Take 1 tablet (4 mg total) by mouth daily.    TURMERIC PO Take 1,000 mg by mouth daily.               ROS:  A comprehensive review of systems was negative except for: Eyes: positive for blurred vision Musculoskeletal: positive for back pain Endocrine: positive for diabetes   Blood pressure 102/65, pulse 80, temperature 98.3 F (36.8 C), temperature source Oral, resp. rate 14, height 5\' 5"  (1.651 m), weight 223 lb (101.2 kg). Physical Exam Vitals reviewed.  Constitutional:      Appearance: Normal appearance.  HENT:     Head: Normocephalic.     Nose: Nose normal.     Mouth/Throat:     Mouth: Mucous membranes are moist.  Eyes:     Extraocular Movements: Extraocular movements intact.  Cardiovascular:     Rate and Rhythm: Normal rate and regular rhythm.  Pulmonary:     Effort: Pulmonary effort is normal.     Breath sounds: Normal breath sounds.  Chest:  Breasts:    Right: No mass, nipple discharge, skin change or tenderness.     Left: No mass, nipple discharge, skin change or tenderness.  Abdominal:     General: There is no distension.     Palpations: Abdomen is soft.     Tenderness: There is no abdominal tenderness.  Musculoskeletal:        General: Normal range of motion.  Lymphadenopathy:     Upper Body:     Right upper body: No axillary adenopathy.     Left upper body: No axillary adenopathy.  Skin:    General: Skin is warm.  Neurological:     General: No focal deficit present.     Mental Status: She is alert and oriented to person, place, and time.  Psychiatric:        Mood and Affect: Mood normal.        Behavior: Behavior  normal.        Thought Content: Thought content normal.      Results: ADDENDUM REPORT: 12/07/2020 14:55   ADDENDUM: PATHOLOGY revealed: A. BREAST, 3:00, RIGHT, BIOPSY: - Intraductal papilloma with florid usual ductal hyperplasia and sclerosing fibrosis, see comment. COMMENT: Immunohistochemical stain for CK5/6 does not show evidence of atypical hyperplasia.   Pathology results are CONCORDANT with imaging findings, per Dr. Everlean Alstrom with excision recommended.   Pathology results and recommendations were discussed with patient via telephone on 12/07/2020. Patient reported doing well after the biopsy with no adverse symptoms, and only slight tenderness at the site. Post biopsy care instructions were reviewed, questions were answered and my direct phone number was provided. Patient was instructed to call [facility] Mammography Department for any additional questions or concerns related to biopsy site.   Recommend surgical consultation for consideration of excision. Request for surgical consultation was relayed to Kathi Der RT at [facility] Department by Electa Sniff RN on 12/07/2020.   Pathology results reported by Electa Sniff RN on 12/07/2020.     Electronically Signed   By: Everlean Alstrom M.D.   On: 12/07/2020 14:55   CLINICAL DATA:  Screening recall for a possible right breast mass.   EXAM: DIGITAL DIAGNOSTIC UNILATERAL RIGHT MAMMOGRAM WITH TOMOSYNTHESIS AND CAD; ULTRASOUND RIGHT BREAST  LIMITED   TECHNIQUE: Right digital diagnostic mammography and breast tomosynthesis was performed. The images were evaluated with computer-aided detection.; Targeted ultrasound examination of the right breast was performed   COMPARISON:  Previous exam(s).   ACR Breast Density Category b: There are scattered areas of fibroglandular density.   FINDINGS: The possible mass, noted in the lower inner quadrant of the right breast the  current screening study, persists on diagnostic imaging as a 8-9 mm mass with partly circumscribed partly ill-defined margins and the suggestion associated architectural distortion. This projects at approximately 4 o'clock in the right breast, middle depth.   Targeted right breast ultrasound is performed, showing a hypoechoic mass with mildly lobulated, mostly circumscribed margins, at 3 o'clock, retroareolar, measuring 9 x 7 x 9 mm, consistent in size, shape and location to the mammographic mass. Subtle associated architectural distortion is suggested on the radial views. Sonographic evaluation of the right axilla shows no enlarged or abnormal lymph nodes.   IMPRESSION: 1. Suspicious 9 mm mass in the 3 o'clock, retroareolar right breast. Tissue sampling is recommended. No evidence of right axillary lymphadenopathy.   RECOMMENDATION: 1. Ultrasound-guided core needle biopsy of the right breast mass. This has been scheduled for December 01, 2020 at 1 p.m.   I have discussed the findings and recommendations with the patient. If applicable, a reminder letter will be sent to the patient regarding the next appointment.   BI-RADS CATEGORY  4: Suspicious.     Electronically Signed   By: Lajean Manes M.D.   On: 11/24/2020 09:34     Assessment & Plan:  Hannah Cooper is a 65 y.o. female with a right breast intraductal papilloma that was found on imaging and biopsy. Discussed that this could harbor atypical or cancerous cells.    Discussed excisional biopsy after tag placement. Discussed risk of bleeding, infection, cosmetic changes, needing more surgery, finding cancer.     She will take Xanax before her tag placement to help with her anxiety. Xanax 1-2 mg before the procedure prescribed.    All questions were answered to the satisfaction of the patient.     Virl Cagey 01/05/2021, 9:59 AM

## 2021-01-19 ENCOUNTER — Other Ambulatory Visit (HOSPITAL_COMMUNITY): Payer: Self-pay | Admitting: General Surgery

## 2021-01-19 ENCOUNTER — Other Ambulatory Visit: Payer: Self-pay

## 2021-01-19 ENCOUNTER — Ambulatory Visit (HOSPITAL_COMMUNITY)
Admission: RE | Admit: 2021-01-19 | Discharge: 2021-01-19 | Disposition: A | Discharge status: Home / Self Care | Payer: PRIVATE HEALTH INSURANCE | Source: Ambulatory Visit | Attending: General Surgery | Admitting: General Surgery

## 2021-01-19 DIAGNOSIS — R928 Other abnormal and inconclusive findings on diagnostic imaging of breast: Secondary | ICD-10-CM

## 2021-01-19 DIAGNOSIS — N6315 Unspecified lump in the right breast, overlapping quadrants: Secondary | ICD-10-CM | POA: Diagnosis not present

## 2021-01-19 MED ORDER — LIDOCAINE-EPINEPHRINE (PF) 2 %-1:200000 IJ SOLN
INTRAMUSCULAR | Status: AC
  Filled 2021-01-19: qty 10

## 2021-01-19 NOTE — Patient Instructions (Signed)
Zoya YAZLYNN BIRKELAND  01/19/2021     @PREFPERIOPPHARMACY @   Your procedure is scheduled on  01/22/2021.   Report to [facility] at  [phone]  A.M.   Call this number if you have problems the morning of surgery:  [phone]   Remember:  Do not eat or drink after midnight.    Use your nebulizer and your inhaler before you come and bring your rescue inhaler with you.    Take 7.5 units of your insulin the night before your procedure.       DO NOT take any medications for diabetes the morning of your procedure.      Take these medicines the morning of surgery with A SIP OF WATER        xanax (if needed), diltiazem, hydrocodone (if needed), isosorbide, metoprolol.    Do not wear jewelry, make-up or nail polish.  Do not wear lotions, powders, or perfumes, or deodorant.  Do not shave 48 hours prior to surgery.  Men may shave face and neck.  Do not bring valuables to the hospital.  [facility] is not responsible for any belongings or valuables.  Contacts, dentures or bridgework may not be worn into surgery.  Leave your suitcase in the car.  After surgery it may be brought to your room.  For patients admitted to the hospital, discharge time will be determined by your treatment team.  Patients discharged the day of surgery will not be allowed to drive home and must have someone with them for 24 hours.    Special instructions:   DO NOT smoke tobacco or vape for 24 hours before your procedure.  Please read over the following fact sheets that you were given. Anesthesia Post-op Instructions and Care and Recovery After Surgery      Breast Biopsy, Care After These instructions give you information about caring for yourself after your procedure. Your doctor may also give you more specific instructions. Call your doctor if you have any problems or questions after your procedure. What can I expect after the procedure? After your procedure, it is common to have: Bruising on your  breast. Numbness, tingling, or pain near your biopsy site. Follow these instructions at home: Medicines Take over-the-counter and prescription medicines only as told by your doctor. Do not drive for 24 hours if you were given a medicine to help you relax (sedative) during your procedure. Do not drink alcohol while taking pain medicine. Do not drive or use heavy machinery while taking prescription pain medicine. Biopsy site care   Follow instructions from your doctor about how to take care of your cut from surgery (incision) or your puncture area. Make sure you: Wash your hands with soap and water before you change your bandage (dressing). If you cannot use soap and water, use hand sanitizer. Change your bandage as told by your doctor. Leave stitches (sutures), skin glue, or skin tape (adhesive strips) in place. They may need to stay in place for 2 weeks or longer. If tape strips get loose and curl up, you may trim the loose edges. Do not remove tape strips completely unless your doctor says it is okay. If you have stitches, keep them dry when you take a bath or a shower. Check your cut or puncture area every day for signs of infection. Check for: Redness, swelling, or pain. Fluid or blood. Warmth. Pus or a bad smell. Protect the biopsy area. Do not let the area get bumped.  Activity If you had a cut during your procedure, avoid activities that could pull your cut open. These include: Stretching. Reaching over your head. Exercise. Sports. Lifting anything that weighs more than 3 lb (1.4 kg). Return to your normal activities as told by your doctor. Ask your doctor what activities are safe for you. Managing pain, stiffness, and swelling If told, put ice on the biopsy site to relieve swelling: Put ice in a plastic bag. Place a towel between your skin and the bag. Leave the ice on for 20 minutes, 2-3 times a day. General instructions Continue your normal diet. Wear a good support bra  for as long as told by your doctor. Get checked for extra fluid around your lymph nodes (lymphedema) as often as told by your doctor. Keep all follow-up visits as told by your doctor. This is important. Contact a doctor if: You notice any of the following at the biopsy site: More redness, swelling, or pain. More fluid or blood coming from the site. The site feels warm to the touch. Pus or a bad smell coming from the site. The site breaks open after the stitches or skin tape strips have been removed. You have a rash. You have a fever. Get help right away if: You have more bleeding from the biopsy site. Get help right away if bleeding is more than a small spot. You have trouble breathing. You have red streaks around the biopsy site. Summary After your procedure, it is common to have bruising, numbness, tingling, or pain near the biopsy site. Do not drive or use heavy machinery while taking prescription pain medicine. Wear a good support bra for as long as told by your doctor. If you had a cut during your procedure, avoid activities that may pull the cut open. Ask your doctor what activities are safe for you. This information is not intended to replace advice given to you by your health care provider. Make sure you discuss any questions you have with your health care provider. Document Revised: 12/16/2019 Document Reviewed: 09/21/2017 Elsevier Patient Education  2022 [place] Anesthesia, Adult, Care After This sheet gives you information about how to care for yourself after your procedure. Your health care provider may also give you more specific instructions. If you have problems or questions, contact your health care provider. What can I expect after the procedure? After the procedure, the following side effects are common: Pain or discomfort at the IV site. Nausea. Vomiting. Sore throat. Trouble concentrating. Feeling cold or chills. Feeling weak or tired. Sleepiness  and fatigue. Soreness and body aches. These side effects can affect parts of the body that were not involved in surgery. Follow these instructions at home: For the time period you were told by your health care provider:  Rest. Do not participate in activities where you could fall or become injured. Do not drive or use machinery. Do not drink alcohol. Do not take sleeping pills or medicines that cause drowsiness. Do not make important decisions or sign legal documents. Do not take care of children on your own. Eating and drinking Follow any instructions from your health care provider about eating or drinking restrictions. When you feel hungry, start by eating small amounts of foods that are soft and easy to digest (bland), such as toast. Gradually return to your regular diet. Drink enough fluid to keep your urine pale yellow. If you vomit, rehydrate by drinking water, juice, or clear broth. General instructions If you have sleep apnea, surgery  and certain medicines can increase your risk for breathing problems. Follow instructions from your health care provider about wearing your sleep device: Anytime you are sleeping, including during daytime naps. While taking prescription pain medicines, sleeping medicines, or medicines that make you drowsy. Have a responsible adult stay with you for the time you are told. It is important to have someone help care for you until you are awake and alert. Return to your normal activities as told by your health care provider. Ask your health care provider what activities are safe for you. Take over-the-counter and prescription medicines only as told by your health care provider. If you smoke, do not smoke without supervision. Keep all follow-up visits as told by your health care provider. This is important. Contact a health care provider if: You have nausea or vomiting that does not get better with medicine. You cannot eat or drink without vomiting. You  have pain that does not get better with medicine. You are unable to pass urine. You develop a skin rash. You have a fever. You have redness around your IV site that gets worse. Get help right away if: You have difficulty breathing. You have chest pain. You have blood in your urine or stool, or you vomit blood. Summary After the procedure, it is common to have a sore throat or nausea. It is also common to feel tired. Have a responsible adult stay with you for the time you are told. It is important to have someone help care for you until you are awake and alert. When you feel hungry, start by eating small amounts of foods that are soft and easy to digest (bland), such as toast. Gradually return to your regular diet. Drink enough fluid to keep your urine pale yellow. Return to your normal activities as told by your health care provider. Ask your health care provider what activities are safe for you. This information is not intended to replace advice given to you by your health care provider. Make sure you discuss any questions you have with your health care provider. Document Revised: 12/19/2019 Document Reviewed: 07/18/2019 Elsevier Patient Education  2022 [place]. How to Use Chlorhexidine for Bathing Chlorhexidine gluconate (CHG) is a germ-killing (antiseptic) solution that is used to clean the skin. It can get rid of the bacteria that normally live on the skin and can keep them away for about 24 hours. To clean your skin with CHG, you may be given: A CHG solution to use in the shower or as part of a sponge bath. A prepackaged cloth that contains CHG. Cleaning your skin with CHG may help lower the risk for infection: While you are staying in the intensive care unit of the hospital. If you have a vascular access, such as a central line, to provide short-term or long-term access to your veins. If you have a catheter to drain urine from your bladder. If you are on a ventilator. A  ventilator is a machine that helps you breathe by moving air in and out of your lungs. After surgery. What are the risks? Risks of using CHG include: A skin reaction. Hearing loss, if CHG gets in your ears and you have a perforated eardrum. Eye injury, if CHG gets in your eyes and is not rinsed out. The CHG product catching fire. Make sure that you avoid smoking and flames after applying CHG to your skin. Do not use CHG: If you have a chlorhexidine allergy or have previously reacted to chlorhexidine. On babies  younger than 78 months of age. How to use CHG solution Use CHG only as told by your health care provider, and follow the instructions on the label. Use the full amount of CHG as directed. Usually, this is one bottle. During a shower Follow these steps when using CHG solution during a shower (unless your health care provider gives you different instructions): Start the shower. Use your normal soap and shampoo to wash your face and hair. Turn off the shower or move out of the shower stream. Pour the CHG onto a clean washcloth. Do not use any type of brush or rough-edged sponge. Starting at your neck, lather your body down to your toes. Make sure you follow these instructions: If you will be having surgery, pay special attention to the part of your body where you will be having surgery. Scrub this area for at least 1 minute. Do not use CHG on your head or face. If the solution gets into your ears or eyes, rinse them well with water. Avoid your genital area. Avoid any areas of skin that have broken skin, cuts, or scrapes. Scrub your back and under your arms. Make sure to wash skin folds. Let the lather sit on your skin for 1-2 minutes or as long as told by your health care provider. Thoroughly rinse your entire body in the shower. Make sure that all body creases and crevices are rinsed well. Dry off with a clean towel. Do not put any substances on your body afterward--such as powder,  lotion, or perfume--unless you are told to do so by your health care provider. Only use lotions that are recommended by the manufacturer. Put on clean clothes or pajamas. If it is the night before your surgery, sleep in clean sheets.  During a sponge bath Follow these steps when using CHG solution during a sponge bath (unless your health care provider gives you different instructions): Use your normal soap and shampoo to wash your face and hair. Pour the CHG onto a clean washcloth. Starting at your neck, lather your body down to your toes. Make sure you follow these instructions: If you will be having surgery, pay special attention to the part of your body where you will be having surgery. Scrub this area for at least 1 minute. Do not use CHG on your head or face. If the solution gets into your ears or eyes, rinse them well with water. Avoid your genital area. Avoid any areas of skin that have broken skin, cuts, or scrapes. Scrub your back and under your arms. Make sure to wash skin folds. Let the lather sit on your skin for 1-2 minutes or as long as told by your health care provider. Using a different clean, wet washcloth, thoroughly rinse your entire body. Make sure that all body creases and crevices are rinsed well. Dry off with a clean towel. Do not put any substances on your body afterward--such as powder, lotion, or perfume--unless you are told to do so by your health care provider. Only use lotions that are recommended by the manufacturer. Put on clean clothes or pajamas. If it is the night before your surgery, sleep in clean sheets. How to use CHG prepackaged cloths Only use CHG cloths as told by your health care provider, and follow the instructions on the label. Use the CHG cloth on clean, dry skin. Do not use the CHG cloth on your head or face unless your health care provider tells you to. When washing with the CHG  cloth: Avoid your genital area. Avoid any areas of skin that have  broken skin, cuts, or scrapes. Before surgery Follow these steps when using a CHG cloth to clean before surgery (unless your health care provider gives you different instructions): Using the CHG cloth, vigorously scrub the part of your body where you will be having surgery. Scrub using a back-and-forth motion for 3 minutes. The area on your body should be completely wet with CHG when you are done scrubbing. Do not rinse. Discard the cloth and let the area air-dry. Do not put any substances on the area afterward, such as powder, lotion, or perfume. Put on clean clothes or pajamas. If it is the night before your surgery, sleep in clean sheets.  For general bathing Follow these steps when using CHG cloths for general bathing (unless your health care provider gives you different instructions). Use a separate CHG cloth for each area of your body. Make sure you wash between any folds of skin and between your fingers and toes. Wash your body in the following order, switching to a new cloth after each step: The front of your neck, shoulders, and chest. Both of your arms, under your arms, and your hands. Your stomach and groin area, avoiding the genitals. Your right leg and foot. Your left leg and foot. The back of your neck, your back, and your buttocks. Do not rinse. Discard the cloth and let the area air-dry. Do not put any substances on your body afterward--such as powder, lotion, or perfume--unless you are told to do so by your health care provider. Only use lotions that are recommended by the manufacturer. Put on clean clothes or pajamas. Contact a health care provider if: Your skin gets irritated after scrubbing. You have questions about using your solution or cloth. You swallow any chlorhexidine. Call your local poison control center (1-(512)852-7654 in the [place] Get help right away if: Your eyes itch badly, or they become very red or swollen. Your skin itches badly and is red or  swollen. Your hearing changes. You have trouble seeing. You have swelling or tingling in your mouth or throat. You have trouble breathing. These symptoms may represent a serious problem that is an emergency. Do not wait to see if the symptoms will go away. Get medical help right away. Call your local emergency services (911 in the [place] Do not drive yourself to the hospital. Summary Chlorhexidine gluconate (CHG) is a germ-killing (antiseptic) solution that is used to clean the skin. Cleaning your skin with CHG may help to lower your risk for infection. You may be given CHG to use for bathing. It may be in a bottle or in a prepackaged cloth to use on your skin. Carefully follow your health care provider's instructions and the instructions on the product label. Do not use CHG if you have a chlorhexidine allergy. Contact your health care provider if your skin gets irritated after scrubbing. This information is not intended to replace advice given to you by your health care provider. Make sure you discuss any questions you have with your health care provider. Document Revised: 06/15/2020 Document Reviewed: 06/15/2020 Elsevier Patient Education  2022 [employer].

## 2021-01-20 ENCOUNTER — Encounter (HOSPITAL_COMMUNITY): Payer: Self-pay

## 2021-01-20 ENCOUNTER — Encounter (HOSPITAL_COMMUNITY)
Admission: RE | Admit: 2021-01-20 | Discharge: 2021-01-20 | Disposition: A | Discharge status: Home / Self Care | Payer: PRIVATE HEALTH INSURANCE | Source: Ambulatory Visit | Attending: General Surgery | Admitting: General Surgery

## 2021-01-20 VITALS — BP 129/81 | HR 74 | Temp 98.4°F | Resp 18 | Ht 65.0 in | Wt 223.0 lb

## 2021-01-20 DIAGNOSIS — E119 Type 2 diabetes mellitus without complications: Secondary | ICD-10-CM

## 2021-01-20 DIAGNOSIS — R7989 Other specified abnormal findings of blood chemistry: Secondary | ICD-10-CM | POA: Insufficient documentation

## 2021-01-20 DIAGNOSIS — Z01812 Encounter for preprocedural laboratory examination: Secondary | ICD-10-CM | POA: Diagnosis not present

## 2021-01-20 LAB — BASIC METABOLIC PANEL
Anion gap: 7 (ref 5–15)
BUN: 14 mg/dL (ref 8–23)
CO2: 30 mmol/L (ref 22–32)
Calcium: 9.6 mg/dL (ref 8.9–10.3)
Chloride: 102 mmol/L (ref 98–111)
Creatinine, Ser: 0.77 mg/dL (ref 0.44–1.00)
GFR, Estimated: >60 mL/min (ref >60)
Glucose, Bld: 93 mg/dL (ref 70–99)
Potassium: 3.5 mmol/L (ref 3.5–5.1)
Sodium: 139 mmol/L (ref 135–145)

## 2021-01-20 LAB — HEMOGLOBIN A1C
Hgb A1c MFr Bld: 6.8 % — ABNORMAL HIGH (ref 4.8–5.6)
Mean Plasma Glucose: 148.46 mg/dL

## 2021-01-22 ENCOUNTER — Other Ambulatory Visit: Payer: Self-pay

## 2021-01-22 ENCOUNTER — Encounter (HOSPITAL_COMMUNITY)
Admission: RE | Admit: 2021-01-22 | Discharge: 2021-01-22 | Disposition: A | Discharge status: Home / Self Care | Payer: HMO | Source: Ambulatory Visit | Attending: General Surgery | Admitting: General Surgery

## 2021-01-22 DIAGNOSIS — D241 Benign neoplasm of right breast: Secondary | ICD-10-CM | POA: Diagnosis present

## 2021-01-25 ENCOUNTER — Encounter (HOSPITAL_COMMUNITY)
Admission: RE | Disposition: A | Discharge status: Home / Self Care | Payer: Self-pay | Source: Home / Self Care | Attending: General Surgery

## 2021-01-25 ENCOUNTER — Ambulatory Visit (HOSPITAL_COMMUNITY)
Admission: RE | Admit: 2021-01-25 | Discharge: 2021-01-25 | Disposition: A | Discharge status: Home / Self Care | Payer: PRIVATE HEALTH INSURANCE | Attending: General Surgery | Admitting: General Surgery

## 2021-01-25 ENCOUNTER — Ambulatory Visit (HOSPITAL_COMMUNITY): Payer: PRIVATE HEALTH INSURANCE | Admitting: Anesthesiology

## 2021-01-25 ENCOUNTER — Ambulatory Visit (HOSPITAL_COMMUNITY): Payer: PRIVATE HEALTH INSURANCE

## 2021-01-25 ENCOUNTER — Encounter (HOSPITAL_COMMUNITY): Payer: Self-pay | Admitting: General Surgery

## 2021-01-25 DIAGNOSIS — D241 Benign neoplasm of right breast: Secondary | ICD-10-CM | POA: Diagnosis not present

## 2021-01-25 DIAGNOSIS — I5032 Chronic diastolic (congestive) heart failure: Secondary | ICD-10-CM | POA: Insufficient documentation

## 2021-01-25 DIAGNOSIS — N6310 Unspecified lump in the right breast, unspecified quadrant: Secondary | ICD-10-CM

## 2021-01-25 DIAGNOSIS — I110 Hypertensive heart disease with heart failure: Secondary | ICD-10-CM | POA: Insufficient documentation

## 2021-01-25 DIAGNOSIS — Z923 Personal history of irradiation: Secondary | ICD-10-CM | POA: Insufficient documentation

## 2021-01-25 DIAGNOSIS — Z803 Family history of malignant neoplasm of breast: Secondary | ICD-10-CM | POA: Insufficient documentation

## 2021-01-25 DIAGNOSIS — J449 Chronic obstructive pulmonary disease, unspecified: Secondary | ICD-10-CM | POA: Insufficient documentation

## 2021-01-25 DIAGNOSIS — R928 Other abnormal and inconclusive findings on diagnostic imaging of breast: Secondary | ICD-10-CM | POA: Diagnosis not present

## 2021-01-25 DIAGNOSIS — Z85830 Personal history of malignant neoplasm of bone: Secondary | ICD-10-CM | POA: Insufficient documentation

## 2021-01-25 DIAGNOSIS — I493 Ventricular premature depolarization: Secondary | ICD-10-CM | POA: Insufficient documentation

## 2021-01-25 DIAGNOSIS — Z79899 Other long term (current) drug therapy: Secondary | ICD-10-CM | POA: Insufficient documentation

## 2021-01-25 HISTORY — PX: BREAST LUMPECTOMY WITH RADIOFREQUENCY TAG IDENTIFICATION: SHX6884

## 2021-01-25 HISTORY — PX: EXCISION OF BREAST BIOPSY: SHX5822

## 2021-01-25 LAB — GLUCOSE, CAPILLARY
Glucose-Capillary: 116 mg/dL — ABNORMAL HIGH (ref 70–99)
Glucose-Capillary: 117 mg/dL — ABNORMAL HIGH (ref 70–99)

## 2021-01-25 SURGERY — EXCISION OF BREAST BIOPSY
Anesthesia: General | Site: Breast | Laterality: Right

## 2021-01-25 MED ORDER — ONDANSETRON HCL 4 MG/2ML IJ SOLN
4.0000 mg | Freq: Once | INTRAMUSCULAR | Status: DC | PRN
Start: 2021-01-25 — End: 2021-01-25

## 2021-01-25 MED ORDER — GLYCOPYRROLATE 0.2 MG/ML IJ SOLN
INTRAMUSCULAR | Status: DC | PRN
  Administered 2021-01-25: .2 mg via INTRAVENOUS

## 2021-01-25 MED ORDER — LIDOCAINE HCL (CARDIAC) PF 100 MG/5ML IV SOSY
PREFILLED_SYRINGE | INTRAVENOUS | Status: DC | PRN
  Administered 2021-01-25: 80 mg via INTRATRACHEAL

## 2021-01-25 MED ORDER — BUPIVACAINE HCL (PF) 0.5 % IJ SOLN
INTRAMUSCULAR | Status: AC
  Filled 2021-01-25: qty 30

## 2021-01-25 MED ORDER — ONDANSETRON HCL 4 MG/2ML IJ SOLN
INTRAMUSCULAR | Status: AC
  Filled 2021-01-25: qty 2

## 2021-01-25 MED ORDER — CHLORHEXIDINE GLUCONATE CLOTH 2 % EX PADS: 6.0000 | MEDICATED_PAD | Freq: Once | CUTANEOUS | Status: DC

## 2021-01-25 MED ORDER — BUPIVACAINE HCL (PF) 0.5 % IJ SOLN
INTRAMUSCULAR | Status: DC | PRN
  Administered 2021-01-25: 10 mL

## 2021-01-25 MED ORDER — EPHEDRINE SULFATE 50 MG/ML IJ SOLN
INTRAMUSCULAR | Status: DC | PRN
  Administered 2021-01-25: 5 mg via INTRAVENOUS

## 2021-01-25 MED ORDER — MIDAZOLAM HCL 2 MG/2ML IJ SOLN
INTRAMUSCULAR | Status: AC
  Filled 2021-01-25: qty 2

## 2021-01-25 MED ORDER — PROPOFOL 10 MG/ML IV BOLUS
INTRAVENOUS | Status: AC
  Filled 2021-01-25: qty 20

## 2021-01-25 MED ORDER — CEFAZOLIN SODIUM-DEXTROSE 2-4 GM/100ML-% IV SOLN
2.0000 g | INTRAVENOUS | Status: AC
  Administered 2021-01-25: 2 g via INTRAVENOUS
  Filled 2021-01-25: qty 100

## 2021-01-25 MED ORDER — LACTATED RINGERS IV SOLN
INTRAVENOUS | Status: DC
  Administered 2021-01-25: 1000 mL via INTRAVENOUS

## 2021-01-25 MED ORDER — MIDAZOLAM HCL 5 MG/5ML IJ SOLN
INTRAMUSCULAR | Status: DC | PRN
  Administered 2021-01-25: 2 mg via INTRAVENOUS

## 2021-01-25 MED ORDER — ONDANSETRON HCL 4 MG/2ML IJ SOLN
INTRAMUSCULAR | Status: DC | PRN
  Administered 2021-01-25: 4 mg via INTRAVENOUS

## 2021-01-25 MED ORDER — ORAL CARE MOUTH RINSE: 15.0000 mL | Freq: Once | OROMUCOSAL | Status: AC

## 2021-01-25 MED ORDER — FENTANYL CITRATE (PF) 250 MCG/5ML IJ SOLN
INTRAMUSCULAR | Status: AC
  Filled 2021-01-25: qty 5

## 2021-01-25 MED ORDER — EPHEDRINE 5 MG/ML INJ
INTRAVENOUS | Status: AC
  Filled 2021-01-25: qty 5

## 2021-01-25 MED ORDER — DEXAMETHASONE SODIUM PHOSPHATE 10 MG/ML IJ SOLN
INTRAMUSCULAR | Status: AC
  Filled 2021-01-25: qty 1

## 2021-01-25 MED ORDER — FENTANYL CITRATE PF 50 MCG/ML IJ SOSY: 25.0000 ug | PREFILLED_SYRINGE | INTRAMUSCULAR | Status: DC | PRN

## 2021-01-25 MED ORDER — FENTANYL CITRATE (PF) 100 MCG/2ML IJ SOLN
INTRAMUSCULAR | Status: DC | PRN
  Administered 2021-01-25 (×3): 50 ug via INTRAVENOUS

## 2021-01-25 MED ORDER — GLYCOPYRROLATE PF 0.2 MG/ML IJ SOSY
PREFILLED_SYRINGE | INTRAMUSCULAR | Status: AC
  Filled 2021-01-25: qty 1

## 2021-01-25 MED ORDER — OXYCODONE HCL 5 MG PO TABS: 5.0000 mg | ORAL_TABLET | ORAL | 0 refills | Status: DC | PRN

## 2021-01-25 MED ORDER — PROPOFOL 10 MG/ML IV BOLUS
INTRAVENOUS | Status: DC | PRN
  Administered 2021-01-25: 150 mg via INTRAVENOUS

## 2021-01-25 MED ORDER — CHLORHEXIDINE GLUCONATE 0.12 % MT SOLN
15.0000 mL | Freq: Once | OROMUCOSAL | Status: AC
  Administered 2021-01-25: 15 mL via OROMUCOSAL

## 2021-01-25 MED ORDER — 0.9 % SODIUM CHLORIDE (POUR BTL) OPTIME
TOPICAL | Status: DC | PRN
  Administered 2021-01-25: 1000 mL

## 2021-01-25 MED ORDER — LIDOCAINE HCL (PF) 2 % IJ SOLN
INTRAMUSCULAR | Status: AC
  Filled 2021-01-25: qty 5

## 2021-01-25 MED ORDER — MEPERIDINE HCL 50 MG/ML IJ SOLN: 6.2500 mg | INTRAMUSCULAR | Status: DC | PRN

## 2021-01-25 SURGICAL SUPPLY — 35 items
ADH SKN CLS APL DERMABOND .7 (GAUZE/BANDAGES/DRESSINGS) ×1
APL PRP STRL LF DISP 70% ISPRP (MISCELLANEOUS) ×1
BLADE SURG 15 STRL LF DISP TIS (BLADE) ×1 IMPLANT
BLADE SURG 15 STRL SS (BLADE) ×2
CHLORAPREP W/TINT 26 (MISCELLANEOUS) ×2 IMPLANT
CLOTH BEACON ORANGE TIMEOUT ST (SAFETY) ×2 IMPLANT
COVER LIGHT HANDLE STERIS (MISCELLANEOUS) ×4 IMPLANT
DERMABOND ADVANCED (GAUZE/BANDAGES/DRESSINGS) ×1
DERMABOND ADVANCED .7 DNX12 (GAUZE/BANDAGES/DRESSINGS) ×1 IMPLANT
ELECT REM PT RETURN 9FT ADLT (ELECTROSURGICAL) ×2
ELECTRODE REM PT RTRN 9FT ADLT (ELECTROSURGICAL) ×1 IMPLANT
GAUZE 4X4 16PLY ~~LOC~~+RFID DBL (SPONGE) ×1 IMPLANT
GLOVE SURG ENC MOIS LTX SZ6.5 (GLOVE) ×4 IMPLANT
GLOVE SURG UNDER POLY LF SZ6.5 (GLOVE) ×2 IMPLANT
GLOVE SURG UNDER POLY LF SZ7 (GLOVE) ×4 IMPLANT
GOWN STRL REUS W/TWL LRG LVL3 (GOWN DISPOSABLE) ×4 IMPLANT
KIT MARKER MARGIN INK (KITS) ×2 IMPLANT
KIT TURNOVER KIT A (KITS) ×2 IMPLANT
MANIFOLD NEPTUNE II (INSTRUMENTS) ×2 IMPLANT
MARKER SKIN DUAL TIP RULER LAB (MISCELLANEOUS) ×2 IMPLANT
NDL HYPO 18GX1.5 BLUNT FILL (NEEDLE) IMPLANT
NDL HYPO 25X1 1.5 SAFETY (NEEDLE) ×1 IMPLANT
NEEDLE HYPO 18GX1.5 BLUNT FILL (NEEDLE) ×2 IMPLANT
NEEDLE HYPO 25X1 1.5 SAFETY (NEEDLE) ×2 IMPLANT
NS IRRIG 1000ML POUR BTL (IV SOLUTION) ×2 IMPLANT
PACK MINOR (CUSTOM PROCEDURE TRAY) ×2 IMPLANT
PAD ARMBOARD 7.5X6 YLW CONV (MISCELLANEOUS) ×2 IMPLANT
SET BASIN LINEN APH (SET/KITS/TRAYS/PACK) ×2 IMPLANT
SET LOCALIZER 20 PROBE US (MISCELLANEOUS) ×2 IMPLANT
SUT MNCRL AB 4-0 PS2 18 (SUTURE) ×2 IMPLANT
SUT SILK 2 0 SH (SUTURE) ×2 IMPLANT
SUT VIC AB 3-0 SH 27 (SUTURE) ×2
SUT VIC AB 3-0 SH 27X BRD (SUTURE) ×1 IMPLANT
SYR 30ML LL (SYRINGE) ×1 IMPLANT
SYR CONTROL 10ML LL (SYRINGE) ×2 IMPLANT

## 2021-01-25 NOTE — Anesthesia Preprocedure Evaluation (Signed)
Anesthesia Evaluation  Patient identified by MRN, date of birth, ID band Patient awake    Reviewed: Allergy & Precautions, NPO status , Patient's Chart, lab work & pertinent test results, reviewed documented beta blocker date and time   Airway Mallampati: II  TM Distance: >3 FB Neck ROM: Full    Dental  (+) Dental Advisory Given, Missing   Pulmonary shortness of breath and with exertion, pneumonia, COPD,  COPD inhaler,    Pulmonary exam normal breath sounds clear to auscultation       Cardiovascular hypertension, Pt. on medications and Pt. on home beta blockers +CHF  Normal cardiovascular exam+ dysrhythmias (PVCs)  Rhythm:Regular Rate:Normal  1. Left ventricular ejection fraction, by estimation, is 65 to 70%. The left ventricle has normal function. The left ventricle has no regional wall motion abnormalities. There is mild left ventricular hypertrophy.  Left ventricular diastolic parameters are indeterminate.  2. Right ventricular systolic function is normal. The right ventricular size is normal. Tricuspid regurgitation signal is inadequate for assessing PA pressure.  3. The mitral valve is grossly normal. Mild mitral valve regurgitation.  4. The aortic valve is tricuspid. Aortic valve regurgitation is not visualized.  5. The inferior vena cava is normal in size with greater than 50% respiratory variability, suggesting right atrial pressure of 3 mmHg.    Neuro/Psych  Neuromuscular disease negative psych ROS   GI/Hepatic negative GI ROS, Neg liver ROS,   Endo/Other  diabetes, Well Controlled, Type 2, Insulin Dependent, Oral Hypoglycemic Agents  Renal/GU negative Renal ROS  negative genitourinary   Musculoskeletal  (+) Arthritis ,   Abdominal   Peds negative pediatric ROS (+)  Hematology negative hematology ROS (+)   Anesthesia Other Findings 12-Sep-2020 04:36:24 [facility] ROUTINE  RECORD [id number] (49 yr) Female Black Vent. rate 61 BPM PR interval 176 ms QRS duration 87 ms QT/QTcB 468/472 ms P-R-T axes 50 38 -36 Sinus rhythm Abnormal R-wave progression, early transition Borderline T abnormalities, inferior leads Nonspecific T wave abnormality Confirmed by Ezequiel Essex [phone]) on 09/12/2020 4:49:19 AM  Reproductive/Obstetrics negative OB ROS                             Anesthesia Physical Anesthesia Plan  ASA: 3  Anesthesia Plan: General   Post-op Pain Management:    Induction: Intravenous  PONV Risk Score and Plan: 4 or greater and Ondansetron  Airway Management Planned: LMA  Additional Equipment:   Intra-op Plan:   Post-operative Plan: Extubation in OR  Informed Consent: I have reviewed the patients History and Physical, chart, labs and discussed the procedure including the risks, benefits and alternatives for the proposed anesthesia with the patient or authorized representative who has indicated his/her understanding and acceptance.     Dental advisory given  Plan Discussed with: CRNA and Surgeon  Anesthesia Plan Comments:         Anesthesia Quick Evaluation

## 2021-01-25 NOTE — Transfer of Care (Signed)
Immediate Anesthesia Transfer of Care Note  Patient: Hannah Cooper  Procedure(s) Performed: EXCISION OF BREAST BIOPSY (Right: Breast) BREAST LUMPECTOMY WITH RADIOFREQUENCY TAG IDENTIFICATION (Right: Breast)  Patient Location: PACU  Anesthesia Type:General  Level of Consciousness: awake, alert  and oriented  Airway & Oxygen Therapy: Patient Spontanous Breathing and Patient connected to face mask oxygen  Post-op Assessment: Report given to RN, Post -op Vital signs reviewed and stable and Patient moving all extremities X 4  Post vital signs: Reviewed and stable  Last Vitals:  Vitals Value Taken Time  BP 128/70 01/25/21 0848  Temp    Pulse 62 01/25/21 0850  Resp 9 01/25/21 0850  SpO2 100 % 01/25/21 0850  Vitals shown include unvalidated device data.  Last Pain:  Vitals:   01/25/21 0726  TempSrc: Oral  PainSc: 8       Patients Stated Pain Goal: 8 (26/37/85 8850)  Complications: No notable events documented.

## 2021-01-25 NOTE — Op Note (Signed)
[facility] Operative Note  01/25/21  Preoperative Diagnosis: Right breast intraductal papilloma    Postoperative Diagnosis: Same   Procedure(s) Performed: Excisional right breast biopsy after radiofrequency tag placement    Surgeon: Lanell Matar. Constance Haw, MD   Assistants: No qualified resident was available    Anesthesia: General endotracheal   Anesthesiologist: Denese Killings, MD    Specimens: Right breast (marked with paint)   Estimated Blood Loss: Minimal   Blood Replacement: None    Complications: None   Wound Class: Clean    Operative Indications: Hannah Cooper is a 65 yo with intraductual papilloma on breast biopsy who needs further surgical excision to confirm the diagnosis. We discussed bleeding, infection, finding cancer, needing further surgery or treatment.   Findings: Radiofrequency tag and biopsy in specimen    Procedure: The patient was taken to the operating room and placed supine. General endotracheal anesthesia was induced. Intravenous antibiotics were  administered per protocol.  The radiofrequency probe was used to detect the spot on the breast that was the shortest distance.  The mammogram after radiofrequency tag placement was reviewed.  The right breast was prepared and draped in the usual sterile fashion.   In the lower inner quadrant a radial incision was made from the lower border of the areola inferiorly.  An excision of the breast tissue was performed using the radiofrequency probe to ensure adequate margins around the area of interest. The breast tissue was excised and painted. The specimen was sent for mammogram with confirmation of the clip and radiofrequency tag. The cavity was made hemostatic and local was injected. The space was left open for seroma formation for cosmesis. The dermal layer was closed with interrupted 3-0 Vicryl and the skin was closed with 4-0 subcuticular Monocyrl  and dermabond.   Final inspection revealed  acceptable hemostasis. All counts were correct at the end of the case. The patient was awakened from anesthesia and extubated without complication.  The patient went to the PACU in stable condition.   [name], MD [facility] [address] [place], [place] [address] [phone] (office)

## 2021-01-25 NOTE — Discharge Instructions (Signed)
Discharge instructions after breast tissue excision:   Common Complaints: Pain and bruising at the incision sites.  Swelling at the incision sites.  Diet/ Activity: Diet as tolerated.  You may shower but do not take hot showers as this can disrupt the glue. Rest and listen to your body, but do not remain in bed all day.  Walk everyday for at least 15-20 minutes. Deep cough and move around every 1-2 hours in the first few days after surgery.  Do not lift > 10 lbs for the first 2 weeks after surgery. Do not do anything that makes you feel like you are putting unnecessary pull or stretch on the incision sites.  Do move your arm and shoulder. If you do not move then you can get stiff and hurt more.  Do not pick at the dermabond glue on your incision sites.  This glue film will remain in place for 1-2 weeks and will start to peel off.  Do not place lotions or balms on your incision unless instructed to specifically by Dr. Constance Haw.   Pain Expectations and Narcotics: -After surgery you will have pain associated with your incisions and this is normal. The pain is muscular and nerve pain, and will get better with time. -You are encouraged and expected to take non narcotic medications like tylenol and ibuprofen (when able) to treat pain as multiple modalities can aid with pain treatment. -Narcotics are only used when pain is severe or there is breakthrough pain. -You are not expected to have a pain score of 0 after surgery, as we cannot prevent pain. A pain score of 3-4 that allows you to be functional, move, walk, and tolerate some activity is the goal. The pain will continue to improve over the days after surgery and is dependent on your surgery. -Due to  law, we are only able to give a certain amount of pain medication to treat post operative pain, and we only give additional narcotics on a patient by patient basis.  -For most laparoscopic surgery, studies have shown that the majority of patients  only need 10-15 narcotic pills, and for open surgeries most patients only need 15-20.   -Having appropriate expectations of pain and knowledge of pain management with non narcotics is important as we do not want anyone to become addicted to narcotic pain medication.  -Using ice packs in the first 48 hours and heating pads after 48 hours, wearing an abdominal binder (when recommended), and using over the counter medications are all ways to help with pain management.   -Simple acts like meditation and mindfulness practices after surgery can also help with pain control and research has proven the benefit of these practices.  Medication: Take tylenol and ibuprofen as needed for pain control, alternating every 4-6 hours.  Example:  Tylenol 1000mg  @ 6am, 12noon, 6pm, 68midnight (Do not exceed 4000mg  of tylenol a day). Ibuprofen 800mg  @ 9am, 3pm, 9pm, 3am (Do not exceed 3600mg  of ibuprofen a day).  Take Roxicodone for breakthrough pain every 4 hours.  Take Colace for constipation related to narcotic pain medication. If you do not have a bowel movement in 2 days, take Miralax over the counter.  Drink plenty of water to also prevent constipation.   Contact Information: If you have questions or concerns, please call our office, [phone], Monday- Thursday 8AM-5PM and Friday 8AM-12Noon.  If it is after hours or on the weekend, please call [facility], [phone], [phone], and ask to speak to the surgeon on  call for Dr. Constance Haw at [facility].

## 2021-01-25 NOTE — Anesthesia Postprocedure Evaluation (Signed)
Anesthesia Post Note  Patient: Hannah Cooper  Procedure(s) Performed: EXCISION OF BREAST BIOPSY (Right: Breast) BREAST LUMPECTOMY WITH RADIOFREQUENCY TAG IDENTIFICATION (Right: Breast)  Patient location during evaluation: PACU Anesthesia Type: General Level of consciousness: awake and alert and oriented Pain management: pain level controlled Vital Signs Assessment: post-procedure vital signs reviewed and stable Respiratory status: spontaneous breathing and respiratory function stable Cardiovascular status: blood pressure returned to baseline and stable Postop Assessment: no apparent nausea or vomiting Anesthetic complications: no   No notable events documented.   Last Vitals:  Vitals:   01/25/21 0923 01/25/21 0928  BP: 133/79 127/69  Pulse: 61 64  Resp: 13 16  Temp:  36.4 C  SpO2: 99% 95%    Last Pain:  Vitals:   01/25/21 0928  TempSrc: Oral  PainSc: 0-No pain                 Cristian Davitt C Abryanna Musolino

## 2021-01-25 NOTE — Progress Notes (Signed)
[facility]  Updated husband. Will see back 10/27 for wound check. Will call with pathology results. Rx sent to [facility].  [name], MD [facility] [address] [place], [place] [address] [phone] (office)

## 2021-01-25 NOTE — Interval H&P Note (Signed)
History and Physical Interval Note:  01/25/2021 7:23 AM  Hannah Cooper  has presented today for surgery, with the diagnosis of intraductal papilloma of right breast.  The various methods of treatment have been discussed with the patient and family. After consideration of risks, benefits and other options for treatment, the patient has consented to  Procedure(s): EXCISION OF BREAST BIOPSY (Right) BREAST LUMPECTOMY WITH RADIOFREQUENCY TAG IDENTIFICATION (Right) as a surgical intervention.  The patient's history has been reviewed, patient examined, no change in status, stable for surgery.  I have reviewed the patient's chart and labs.  Questions were answered to the patient's satisfaction.    No changes. Marked  Virl Cagey

## 2021-01-26 ENCOUNTER — Emergency Department (HOSPITAL_COMMUNITY)
Admission: EM | Admit: 2021-01-26 | Discharge: 2021-01-26 | Disposition: A | Discharge status: Home / Self Care | Payer: PRIVATE HEALTH INSURANCE | Attending: Emergency Medicine | Admitting: Emergency Medicine

## 2021-01-26 ENCOUNTER — Telehealth: Payer: Self-pay | Admitting: Family Medicine

## 2021-01-26 ENCOUNTER — Emergency Department (HOSPITAL_COMMUNITY): Payer: PRIVATE HEALTH INSURANCE

## 2021-01-26 ENCOUNTER — Encounter (HOSPITAL_COMMUNITY): Payer: Self-pay | Admitting: General Surgery

## 2021-01-26 ENCOUNTER — Other Ambulatory Visit: Payer: Self-pay

## 2021-01-26 DIAGNOSIS — Z79899 Other long term (current) drug therapy: Secondary | ICD-10-CM | POA: Insufficient documentation

## 2021-01-26 DIAGNOSIS — E041 Nontoxic single thyroid nodule: Secondary | ICD-10-CM | POA: Insufficient documentation

## 2021-01-26 DIAGNOSIS — Z794 Long term (current) use of insulin: Secondary | ICD-10-CM | POA: Diagnosis not present

## 2021-01-26 DIAGNOSIS — I5032 Chronic diastolic (congestive) heart failure: Secondary | ICD-10-CM | POA: Insufficient documentation

## 2021-01-26 DIAGNOSIS — R079 Chest pain, unspecified: Secondary | ICD-10-CM | POA: Diagnosis not present

## 2021-01-26 DIAGNOSIS — I110 Hypertensive heart disease with heart failure: Secondary | ICD-10-CM | POA: Insufficient documentation

## 2021-01-26 DIAGNOSIS — Z955 Presence of coronary angioplasty implant and graft: Secondary | ICD-10-CM | POA: Diagnosis not present

## 2021-01-26 DIAGNOSIS — Z85830 Personal history of malignant neoplasm of bone: Secondary | ICD-10-CM | POA: Insufficient documentation

## 2021-01-26 DIAGNOSIS — J9811 Atelectasis: Secondary | ICD-10-CM | POA: Diagnosis not present

## 2021-01-26 DIAGNOSIS — J449 Chronic obstructive pulmonary disease, unspecified: Secondary | ICD-10-CM | POA: Diagnosis not present

## 2021-01-26 DIAGNOSIS — Z7984 Long term (current) use of oral hypoglycemic drugs: Secondary | ICD-10-CM | POA: Insufficient documentation

## 2021-01-26 DIAGNOSIS — R0789 Other chest pain: Secondary | ICD-10-CM | POA: Insufficient documentation

## 2021-01-26 LAB — BASIC METABOLIC PANEL
Anion gap: 7 (ref 5–15)
BUN: 16 mg/dL (ref 8–23)
CO2: 30 mmol/L (ref 22–32)
Calcium: 9 mg/dL (ref 8.9–10.3)
Chloride: 100 mmol/L (ref 98–111)
Creatinine, Ser: 0.88 mg/dL (ref 0.44–1.00)
GFR, Estimated: >60 mL/min (ref >60)
Glucose, Bld: 120 mg/dL — ABNORMAL HIGH (ref 70–99)
Potassium: 3.6 mmol/L (ref 3.5–5.1)
Sodium: 137 mmol/L (ref 135–145)

## 2021-01-26 LAB — HEPATIC FUNCTION PANEL
ALT: 28 U/L (ref 0–44)
AST: 22 U/L (ref 15–41)
Albumin: 4.1 g/dL (ref 3.5–5.0)
Alkaline Phosphatase: 66 U/L (ref 38–126)
Bilirubin, Direct: 0.1 mg/dL (ref 0.0–0.2)
Indirect Bilirubin: 0.4 mg/dL (ref 0.3–0.9)
Total Bilirubin: 0.5 mg/dL (ref 0.3–1.2)
Total Protein: 7.7 g/dL (ref 6.5–8.1)

## 2021-01-26 LAB — CBC
HCT: 35.2 % — ABNORMAL LOW (ref 36.0–46.0)
Hemoglobin: 11.7 g/dL — ABNORMAL LOW (ref 12.0–15.0)
MCH: 30.6 pg (ref 26.0–34.0)
MCHC: 33.2 g/dL (ref 30.0–36.0)
MCV: 92.1 fL (ref 80.0–100.0)
Platelets: 263 10*3/uL (ref 150–400)
RBC: 3.82 MIL/uL — ABNORMAL LOW (ref 3.87–5.11)
RDW: 13.7 % (ref 11.5–15.5)
WBC: 3.9 10*3/uL — ABNORMAL LOW (ref 4.0–10.5)
nRBC: 0 % (ref 0.0–0.2)

## 2021-01-26 LAB — TROPONIN I (HIGH SENSITIVITY)
Troponin I (High Sensitivity): 3 ng/L (ref <18)
Troponin I (High Sensitivity): 3 ng/L (ref <18)

## 2021-01-26 LAB — D-DIMER, QUANTITATIVE: D-Dimer, Quant: 0.87 ug/mL-FEU — ABNORMAL HIGH (ref 0.00–0.50)

## 2021-01-26 MED ORDER — IOHEXOL 350 MG/ML SOLN
80.0000 mL | Freq: Once | INTRAVENOUS | Status: AC | PRN
  Administered 2021-01-26: 80 mL via INTRAVENOUS

## 2021-01-26 MED ORDER — ALUM & MAG HYDROXIDE-SIMETH 200-200-20 MG/5ML PO SUSP
30.0000 mL | Freq: Once | ORAL | Status: AC
  Administered 2021-01-26: 30 mL via ORAL
  Filled 2021-01-26: qty 30

## 2021-01-26 MED ORDER — LIDOCAINE VISCOUS HCL 2 % MT SOLN
15.0000 mL | Freq: Once | OROMUCOSAL | Status: AC
  Administered 2021-01-26: 15 mL via ORAL
  Filled 2021-01-26: qty 15

## 2021-01-26 MED ORDER — FAMOTIDINE IN NACL 20-0.9 MG/50ML-% IV SOLN
20.0000 mg | Freq: Once | INTRAVENOUS | Status: AC
  Administered 2021-01-26: 20 mg via INTRAVENOUS
  Filled 2021-01-26: qty 50

## 2021-01-26 NOTE — ED Triage Notes (Signed)
Pt reports mid sternal CP that started this morning. Pt reports having surgery on her breast yesterday.

## 2021-01-26 NOTE — Telephone Encounter (Signed)
Pt called stating that she woke up this am with her chest hurting. Pain is midline with no SOB. Patient states that the pain is a sharpe, stabbing pain that is constant. Recommend she go to the ER to be checked based on her description of her chest pain.  Pt verbalized understanding and will proceed to ER.

## 2021-01-26 NOTE — Discharge Instructions (Addendum)
You will need a non-emergent thyroid ultrasound to further evaluate your thyroid.

## 2021-01-26 NOTE — ED Provider Notes (Signed)
[facility] EMERGENCY DEPARTMENT Provider Note   CSN: [id number] Arrival date & time: 01/26/21  1032     History Chief Complaint  Patient presents with   Chest Pain    Hannah Cooper is a 65 y.o. female.  Pt presents to the ED today with cp.  Pt had a right breast intraductal papilloma that was found on imaging and biopsy.  She had a lumpectomy.  This was done yesterday under general anesthesia by Dr. Constance Haw.  Pt said she vomited several times after the surgery.  Last night, she noticed some pain in the center of her chest.  She called Dr. Sarajane Jews office this am and was told to come to the ED.  Pt said the pain is very sharp.       Past Medical History:  Diagnosis Date   Arthritis    Atypical chest pain    chronic   Cancer (HCC)    bone cancer   Chronic back pain    Chronic diastolic CHF (congestive heart failure) (HCC)    COPD (chronic obstructive pulmonary disease) (HCC)    DDD (degenerative disc disease), cervical    History of radiation therapy 07/16/19-08/22/19   L4 spine ; Dr. 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   Plasma cell disorder 06/10/2019   Lesion of bone of lumbosacral spine 05/28/2019   Left-sided weakness 03/13/2016   Cerebrovascular accident (CVA) ([place])    Palpitations    COPD (chronic obstructive pulmonary disease) ([place]) 08/20/2014   Dyspnea 12/19/2013   Chronic diastolic CHF (congestive heart failure) ([place]) 07/05/2013   Lower extremity edema 07/05/2013   Hyperlipidemia    Chest pain  07/04/2013   Hypokalemia 09/04/2012   Precordial pain 09/04/2012   Viral meningitis 01/22/2011   Vomiting 01/22/2011   PNEUMONIA, LEFT LOWER LOBE 10/10/2006   DISEASE, ACUTE BRONCHOSPASM 10/10/2006   Essential hypertension 05/23/2006   OSTEOARTHRITIS 05/23/2006    Past Surgical History:  Procedure Laterality Date   ABDOMINAL HYSTERECTOMY     BREAST LUMPECTOMY WITH RADIOFREQUENCY TAG IDENTIFICATION Right 01/25/2021   Procedure: BREAST LUMPECTOMY WITH RADIOFREQUENCY TAG IDENTIFICATION;  Surgeon: Virl Cagey, MD;  Location: [facility];  Service: General;  Laterality: Right;   EXCISION OF BREAST BIOPSY Right 01/25/2021   Procedure: EXCISION OF BREAST BIOPSY;  Surgeon: Virl Cagey, MD;  Location: [facility];  Service: General;  Laterality: Right;   IR FLUORO GUIDED NEEDLE PLC ASPIRATION/INJECTION LOC  06/19/2019   LEFT HEART CATHETERIZATION WITH CORONARY ANGIOGRAM N/A 09/05/2012   Procedure: LEFT HEART CATHETERIZATION WITH CORONARY ANGIOGRAM;  Surgeon: Wellington Hampshire, MD;  Location: [facility] CATH [facility];  Service: Cardiovascular;  Laterality: N/A;   RESECTION DISTAL CLAVICAL Left 07/03/2020   Procedure: RESECTION DISTAL CLAVICAL;  Surgeon: Carole Civil, MD;  Location: [facility];  Service: Orthopedics;  Laterality: Left;   SHOULDER OPEN ROTATOR CUFF REPAIR Left 07/03/2020   Procedure: ROTATOR CUFF REPAIR SHOULDER OPEN WITH CHROMEOPLASTY;  Surgeon: Carole Civil, MD;  Location: [facility];  Service: Orthopedics;  Laterality: Left;   THYROID SURGERY  2002     OB History     Gravida      Para      Term      Preterm      AB      Living  0      SAB      IAB      Ectopic      Multiple      Live Births              Family History  Problem Relation Age of Onset   Heart attack Mother 11   Heart attack Sister 41   Breast cancer Maternal Grandmother    Breast cancer Cousin     Social History   Tobacco Use   Smoking status: Never   Smokeless tobacco: Never   Vaping Use   Vaping Use: Never used  Substance Use Topics   Alcohol use: No    Alcohol/week: 0.0 standard drinks   Drug use: No    Home Medications Prior to Admission medications   Medication Sig Start Date End Date Taking? Authorizing Provider  albuterol (PROVENTIL) (2.5 MG/3ML) 0.083% nebulizer solution Take 2.5 mg by nebulization every 6 (six) hours as needed for wheezing or shortness of breath.    [provider]  albuterol (VENTOLIN HFA) 108 (90 Base) MCG/ACT inhaler Inhale 1-2 puffs into the lungs every 4 (four) hours as needed for wheezing or shortness of breath. 11/13/19   Varney Biles, MD  ALPRAZolam Duanne Moron) 1 MG tablet Take 1-2 tablets (1-2 mg total) by mouth as directed. Take 1-2 mg prior to your radiology tag placement appointment. 01/05/21   Virl Cagey, MD  Apoaequorin (PREVAGEN) 10 MG CAPS Take 10 mg by mouth daily.    [provider]  chlorthalidone (HYGROTON) 50 MG tablet Take 1 tablet (50 mg total) by mouth daily. 04/20/20 01/25/21  Imogene Burn, PA-C  Cholecalciferol (DIALYVITE VITAMIN D 5000) 125 MCG (5000 UT) capsule Take 5,000 Units by mouth daily.    [provider]  cycloSPORINE (RESTASIS) 0.05 % ophthalmic emulsion Place 1 drop into both eyes 2 (two) times daily.    [provider]  diltiazem (CARDIZEM) 30 MG tablet Take 1 tablet (30 mg total) by mouth 2 (two) times daily. (MAY TAKE AN ADDITIONAL TAB AS NEEDED FOR PALPITATIONS) 12/02/20   Arnoldo Lenis, MD  furosemide (LASIX) 20 MG tablet Take 1 tablet daily as needed may take 2 tablets prn Patient taking differently: Take 20-40 mg by mouth daily as needed for edema. 11/10/16   Arnoldo Lenis, MD  glipiZIDE-metformin (METAGLIP) 5-500 MG tablet Take 1 tablet by mouth in the morning and at bedtime. 09/27/19   [provider]  ibuprofen (ADVIL) 800 MG tablet Take 1 tablet (800 mg total) by mouth every 8 (eight) hours as needed. 01/04/21   Carole Civil, MD   Insulin Glargine [facility]) 100 UNIT/ML Inject 15 Units into the skin at bedtime as needed (blood sugar over 150). 10/04/19   [provider]  isosorbide mononitrate (IMDUR) 30 MG 24 hr tablet TAKE 1 TABLET (30 MG TOTAL) BY MOUTH DAILY. 04/19/17   Satira Sark, MD  lidocaine (LIDODERM) 5 % Place 2 patches onto the skin daily. Remove & Discard patch within 12 hours or as directed by MD Patient taking differently: Place 2 patches onto the skin daily as needed (pain). Remove & Discard patch within  12 hours or as directed by MD 04/23/20   Ranell Patrick, Clide Deutscher, MD  losartan (COZAAR) 100 MG tablet Take 100 mg by mouth daily.    [provider]  metoprolol tartrate (LOPRESSOR) 100 MG tablet Take 100 mg by mouth 2 (two) times daily.    [provider]  Multiple Vitamin (MULTIVITAMIN WITH MINERALS) TABS tablet Take 1 tablet by mouth daily.    [provider]  ondansetron (ZOFRAN) 4 MG tablet Take 1 tablet (4 mg total) by mouth every 8 (eight) hours as needed for nausea or vomiting. 09/12/20   Long, Wonda Olds, MD  oxyCODONE (ROXICODONE) 5 MG immediate release tablet Take 1 tablet (5 mg total) by mouth every 4 (four) hours as needed for severe pain or breakthrough pain. 01/25/21 01/25/22  Virl Cagey, MD  potassium chloride SA (KLOR-CON) 20 MEQ tablet Take 2 tablets (40 mEq total) by mouth daily. 09/15/20   Derek Jack, MD  RELION PEN NEEDLES 31G X 6 MM MISC USE 1 PEN NEEDLE TO INJECT INSULIN AT BEDTIME 04/03/20   [provider]  RELION PEN NEEDLES 32G X 4 MM MISC  10/14/20   [provider]  tiZANidine (ZANAFLEX) 4 MG tablet Take 1 tablet (4 mg total) by mouth daily. Patient taking differently: Take 4 mg by mouth at bedtime. 09/10/20 09/10/21  Carole Civil, MD  TURMERIC PO Take 1,000 mg by mouth daily.    [provider]    Allergies    Other  Review of Systems   Review of Systems  Cardiovascular:  Positive for  chest pain.  All other systems reviewed and are negative.  Physical Exam Updated Vital Signs BP 118/72 (BP Location: Left Arm)   Pulse (!) 52   Temp 98.6 F (37 C) (Oral)   Resp 16   Ht 5\' 5"  (1.651 m)   Wt 103.9 kg   SpO2 98%   BMI 38.11 kg/m   Physical Exam Vitals and nursing note reviewed.  Constitutional:      Appearance: She is well-developed.  HENT:     Head: Normocephalic and atraumatic.  Eyes:     Extraocular Movements: Extraocular movements intact.     Pupils: Pupils are equal, round, and reactive to light.  Cardiovascular:     Rate and Rhythm: Normal rate and regular rhythm.     Heart sounds: Normal heart sounds.  Pulmonary:     Effort: Pulmonary effort is normal.     Breath sounds: Normal breath sounds.  Chest:     Comments: Surgical site to right breast looks good. Abdominal:     General: Bowel sounds are normal.     Palpations: Abdomen is soft.  Musculoskeletal:        General: Normal range of motion.     Cervical back: Normal range of motion and neck supple.  Skin:    General: Skin is warm.     Capillary Refill: Capillary refill takes less than 2 seconds.  Neurological:     General: No focal deficit present.     Mental Status: She is alert.    ED Results / Procedures / Treatments   Labs (all labs ordered are listed, but only abnormal results are displayed) Labs Reviewed  BASIC METABOLIC PANEL - Abnormal; Notable for the following components:      Result Value   Glucose, Bld 120 (*)    All other components within normal limits  CBC - Abnormal; Notable for the following components:  WBC 3.9 (*)    RBC 3.82 (*)    Hemoglobin 11.7 (*)    HCT 35.2 (*)    All other components within normal limits  D-DIMER, QUANTITATIVE - Abnormal; Notable for the following components:   D-Dimer, Quant 0.87 (*)    All other components within normal limits  HEPATIC FUNCTION PANEL  TROPONIN I (HIGH SENSITIVITY)  TROPONIN I (HIGH SENSITIVITY)    EKG EKG  Interpretation  Date/Time:  Tuesday January 26 2021 10:48:38 EDT Ventricular Rate:  53 PR Interval:  178 QRS Duration: 62 QT Interval:  448 QTC Calculation: 420 R Axis:   20 Text Interpretation: Sinus bradycardia Low voltage QRS Nonspecific T wave abnormality Abnormal ECG No significant change since last tracing Confirmed by Isla Pence [phone]) on 01/26/2021 10:54:41 AM  Radiology DG Chest 2 View  Result Date: 01/26/2021 CLINICAL DATA:  Chest pain EXAM: CHEST - 2 VIEW COMPARISON:  11/13/2019 FINDINGS: Bibasilar atelectasis. Heart is normal size. No effusions or acute bony abnormality. IMPRESSION: Bibasilar atelectasis. Electronically Signed   By: Rolm Baptise M.D.   On: 01/26/2021 11:39   CT Angio Chest PE W and/or Wo Contrast  Result Date: 01/26/2021 CLINICAL DATA:  PE suspected.  High probability. EXAM: CT ANGIOGRAPHY CHEST WITH CONTRAST TECHNIQUE: Multidetector CT imaging of the chest was performed using the standard protocol during bolus administration of intravenous contrast. Multiplanar CT image reconstructions and MIPs were obtained to evaluate the vascular anatomy. CONTRAST:  42mL OMNIPAQUE IOHEXOL 350 MG/ML SOLN COMPARISON:  Chest radiograph, concurrent.  CT chest, 09/12/2020. FINDINGS: Cardiovascular: Satisfactory opacification of the pulmonary arteries to the segmental level. No evidence of segmental or larger pulmonary embolism. Normal heart size. No pericardial effusion. Mediastinum/Nodes: No enlarged mediastinal, hilar, or axillary lymph nodes. Enlarged, nodular RIGHT thyroid gland., trachea, and esophagus demonstrate no significant findings. Lungs/Pleura: Lungs are clear. Trace linear basilar atelectasis. Punctate RIGHT basilar calcified granuloma. No suspicious pulmonary nodule or mass. No pleural effusion or pneumothorax. Upper Abdomen: No acute abnormality. Incidental, colonic interposition at the LEFT upper quadrant. Musculoskeletal: 3.7 x 2.9 cm RIGHT retro areolar  collection with air-fluid level (see key image). No acute osseous abnormality. Review of the MIP images confirms the above findings. IMPRESSION: 1. No segmental or larger pulmonary embolus. 2. 4 cm RIGHT retro areolar air-fluid collection. Correlate with potential recent breast instrumentation post surgery. Differential diagnosis includes breast abscess. 3. Enlarged, nodular RIGHT thyroid gland. Recommend nonemergent, outpatient thyroid ultrasound further evaluation if not already performed. These results will be called to the ordering clinician or representative by the Radiologist Assistant, and communication documented in the PACS or [employer]. Michaelle Birks, MD [facility] Radiology Electronically Signed   By: Michaelle Birks M.D.   On: 01/26/2021 14:25   MM BREAST SURGICAL SPECIMEN  Result Date: 01/25/2021 CLINICAL DATA:  Right EXAM: SPECIMEN RADIOGRAPH OF THE RIGHT BREAST COMPARISON:  Previous exam(s). FINDINGS: Status post excision of the right breast. The radiofrequency tag and ribbon shaped clip are present within the specimen. IMPRESSION: Specimen radiograph of the right breast. Electronically Signed   By: Dorise Bullion III M.D.   On: 01/25/2021 08:35   Procedures Procedures   Medications Ordered in ED Medications  alum & mag hydroxide-simeth (MAALOX/MYLANTA) 200-200-20 MG/5ML suspension 30 mL (30 mLs Oral Given 01/26/21 1151)    And  lidocaine (XYLOCAINE) 2 % viscous mouth solution 15 mL (15 mLs Oral Given 01/26/21 1151)  famotidine (PEPCID) IVPB 20 mg premix (0 mg Intravenous Stopped 01/26/21  1309)  iohexol (OMNIPAQUE) 350 MG/ML injection 80 mL (80 mLs Intravenous Contrast Given 01/26/21 1352)    ED Course  I have reviewed the triage vital signs and the nursing notes.  Pertinent labs & imaging results that were available during my care of the patient were reviewed by me and considered in my medical decision making (see chart for  details).    MDM Rules/Calculators/A&P                           Pt is feeling better.  Cardiac eval negative. CXR is clear.  CT chest shows no pe.  She does have a fluid collection in her breast, but just had surgery yesterday. With no redness and such a short time, I doubt abscess.   Pt also has a nodular right thyroid gland.  She is told to get a non-emergent thyroid US.  Pt's cp is likely from vomiting.    Pt is stable for d/c.  Return if worse.  F/u with pcp. Final Clinical Impression(s) / ED Diagnoses Final diagnoses:  Atypical chest pain  Thyroid nodule    Rx / DC Orders ED Discharge Orders     None        Isla Pence, MD 01/26/21 1455

## 2021-01-27 ENCOUNTER — Encounter: Payer: Self-pay | Admitting: Family Medicine

## 2021-01-27 ENCOUNTER — Inpatient Hospital Stay (HOSPITAL_COMMUNITY): Payer: HMO | Attending: Hematology

## 2021-01-27 DIAGNOSIS — N6341 Unspecified lump in right breast, subareolar: Secondary | ICD-10-CM | POA: Insufficient documentation

## 2021-01-27 DIAGNOSIS — Z79899 Other long term (current) drug therapy: Secondary | ICD-10-CM | POA: Diagnosis not present

## 2021-01-27 DIAGNOSIS — C9030 Solitary plasmacytoma not having achieved remission: Secondary | ICD-10-CM | POA: Insufficient documentation

## 2021-01-27 DIAGNOSIS — M545 Low back pain, unspecified: Secondary | ICD-10-CM | POA: Diagnosis not present

## 2021-01-27 DIAGNOSIS — E876 Hypokalemia: Secondary | ICD-10-CM | POA: Diagnosis not present

## 2021-01-27 LAB — COMPREHENSIVE METABOLIC PANEL
ALT: 28 U/L (ref 0–44)
AST: 22 U/L (ref 15–41)
Albumin: 4.2 g/dL (ref 3.5–5.0)
Alkaline Phosphatase: 66 U/L (ref 38–126)
Anion gap: 8 (ref 5–15)
BUN: 15 mg/dL (ref 8–23)
CO2: 30 mmol/L (ref 22–32)
Calcium: 9 mg/dL (ref 8.9–10.3)
Chloride: 100 mmol/L (ref 98–111)
Creatinine, Ser: 0.79 mg/dL (ref 0.44–1.00)
GFR, Estimated: >60 mL/min (ref >60)
Glucose, Bld: 141 mg/dL — ABNORMAL HIGH (ref 70–99)
Potassium: 3.9 mmol/L (ref 3.5–5.1)
Sodium: 138 mmol/L (ref 135–145)
Total Bilirubin: 0.8 mg/dL (ref 0.3–1.2)
Total Protein: 7.8 g/dL (ref 6.5–8.1)

## 2021-01-27 LAB — CBC WITH DIFFERENTIAL/PLATELET
Abs Immature Granulocytes: 0.01 10*3/uL (ref 0.00–0.07)
Basophils Absolute: 0 10*3/uL (ref 0.0–0.1)
Basophils Relative: 0 %
Eosinophils Absolute: 0.1 10*3/uL (ref 0.0–0.5)
Eosinophils Relative: 3 %
HCT: 35.7 % — ABNORMAL LOW (ref 36.0–46.0)
Hemoglobin: 11.6 g/dL — ABNORMAL LOW (ref 12.0–15.0)
Immature Granulocytes: 0 %
Lymphocytes Relative: 27 %
Lymphs Abs: 0.8 10*3/uL (ref 0.7–4.0)
MCH: 29.9 pg (ref 26.0–34.0)
MCHC: 32.5 g/dL (ref 30.0–36.0)
MCV: 92 fL (ref 80.0–100.0)
Monocytes Absolute: 0.3 10*3/uL (ref 0.1–1.0)
Monocytes Relative: 11 %
Neutro Abs: 1.7 10*3/uL (ref 1.7–7.7)
Neutrophils Relative %: 59 %
Platelets: 268 10*3/uL (ref 150–400)
RBC: 3.88 MIL/uL (ref 3.87–5.11)
RDW: 13.4 % (ref 11.5–15.5)
WBC: 2.9 10*3/uL — ABNORMAL LOW (ref 4.0–10.5)
nRBC: 0 % (ref 0.0–0.2)

## 2021-01-27 LAB — SURGICAL PATHOLOGY

## 2021-01-27 LAB — LACTATE DEHYDROGENASE: LDH: 144 U/L (ref 98–192)

## 2021-01-28 LAB — KAPPA/LAMBDA LIGHT CHAINS
Kappa free light chain: 35.6 mg/L — ABNORMAL HIGH (ref 3.3–19.4)
Kappa, lambda light chain ratio: 2.02 — ABNORMAL HIGH (ref 0.26–1.65)
Lambda free light chains: 17.6 mg/L (ref 5.7–26.3)

## 2021-01-29 LAB — PROTEIN ELECTROPHORESIS, SERUM
A/G Ratio: 1.1 (ref 0.7–1.7)
Albumin ELP: 3.8 g/dL (ref 2.9–4.4)
Alpha-1-Globulin: 0.2 g/dL (ref 0.0–0.4)
Alpha-2-Globulin: 0.6 g/dL (ref 0.4–1.0)
Beta Globulin: 1.1 g/dL (ref 0.7–1.3)
Gamma Globulin: 1.7 g/dL (ref 0.4–1.8)
Globulin, Total: 3.6 g/dL (ref 2.2–3.9)
M-Spike, %: 0.4 g/dL — ABNORMAL HIGH
Total Protein ELP: 7.4 g/dL (ref 6.0–8.5)

## 2021-02-02 NOTE — Progress Notes (Signed)
[place], [place] [address]   CLINIC:  Medical Oncology/Hematology  PCP:  Hannah Cooper [address] / [place] [address] [phone]   REASON FOR VISIT:  Follow-up for L4 plasmacytoma  PRIOR THERAPY: XRT in 27 fractions from 07/17/2019 to 08/22/2019  NGS Results: not done  CURRENT THERAPY: surveillance  BRIEF ONCOLOGIC HISTORY:  Oncology History   No history exists.    CANCER STAGING: Cancer Staging No matching staging information was found for the patient.  INTERVAL HISTORY:  [name], a 65 y.o. female, returns for routine follow-up of her L4 plasmacytoma. [name] was last seen on 10/26/2020.   Today she reports feeling fair. She takes oxycodone prn for lower back pain; she takes up to 2 pills daily. She takes a stool softener twice a week to prevent constipation. She denies numbness/tingling in her hands and feet. She recently underwent a breast biopsy with Dr. Constance Cooper on 12/01/2020 which showed intraductal papilloma with florid usual ductal hyperplasia and sclerosing fibrosis. She reports soreness at the excision site in her right breast.   REVIEW OF SYSTEMS:  Review of Systems  Constitutional:  Positive for fatigue (depleted). Negative for appetite change (50%).  Cardiovascular:  Positive for chest pain (R breast at biopsy site).  Gastrointestinal:  Negative for constipation.  Musculoskeletal:  Positive for back pain (7/10).  Neurological:  Negative for numbness.  All other systems reviewed and are negative.  PAST MEDICAL/SURGICAL HISTORY:  Past Medical History:  Diagnosis Date   Arthritis    Atypical chest pain    chronic   Cancer (HCC)    bone cancer   Chronic back pain    Chronic diastolic CHF (congestive heart failure) (HCC)    COPD (chronic obstructive pulmonary disease) (HCC)    DDD (degenerative disc disease), cervical    History of radiation therapy 07/16/19-08/22/19   L4  spine ; Dr. Gery Cooper   Hyperlipidemia    Hypertension    Lumbar radiculopathy    Meningitis    Normal coronary arteries 2014   Palpitations    Premature atrial contractions    PVC's (premature ventricular contractions)    Past Surgical History:  Procedure Laterality Date   ABDOMINAL HYSTERECTOMY     BREAST LUMPECTOMY WITH RADIOFREQUENCY TAG IDENTIFICATION Right 01/25/2021   Procedure: BREAST LUMPECTOMY WITH RADIOFREQUENCY TAG IDENTIFICATION;  Surgeon: Hannah Cagey, MD;  Location: [facility];  Service: General;  Laterality: Right;   EXCISION OF BREAST BIOPSY Right 01/25/2021   Procedure: EXCISION OF BREAST BIOPSY;  Surgeon: Hannah Cagey, MD;  Location: [facility];  Service: General;  Laterality: Right;   IR FLUORO GUIDED NEEDLE PLC ASPIRATION/INJECTION LOC  06/19/2019   LEFT HEART CATHETERIZATION WITH CORONARY ANGIOGRAM N/A 09/05/2012   Procedure: LEFT HEART CATHETERIZATION WITH CORONARY ANGIOGRAM;  Surgeon: Hannah Hampshire, MD;  Location: [facility];  Service: Cardiovascular;  Laterality: N/A;   RESECTION DISTAL CLAVICAL Left 07/03/2020   Procedure: RESECTION DISTAL CLAVICAL;  Surgeon: Hannah Civil, MD;  Location: [facility];  Service: Orthopedics;  Laterality: Left;   SHOULDER OPEN ROTATOR CUFF REPAIR Left 07/03/2020   Procedure: ROTATOR CUFF REPAIR SHOULDER OPEN WITH CHROMEOPLASTY;  Surgeon: Hannah Civil, MD;  Location: [facility];  Service: Orthopedics;  Laterality: Left;   THYROID SURGERY  2002    SOCIAL HISTORY:  Social History   Socioeconomic History   Marital status: Married    Spouse name: Not on file  Number of children: Not on file   Years of education: Not on file   Highest education level: Not on file  Occupational History   Not on file  Tobacco Use   Smoking status: Never   Smokeless tobacco: Never  Vaping Use   Vaping Use: Never used  Substance and Sexual Activity   Alcohol use: No    Alcohol/week: 0.0 standard drinks   Drug use: No   Sexual  activity: Yes    Partners: Male  Other Topics Concern   Not on file  Social History Narrative   Not on file   Social Determinants of Health   Financial Resource Strain: Not on file  Food Insecurity: Not on file  Transportation Needs: Not on file  Physical Activity: Not on file  Stress: Not on file  Social Connections: Not on file  Intimate Partner Violence: Not on file    FAMILY HISTORY:  Family History  Problem Relation Age of Onset   Heart attack Mother 57   Heart attack Sister 9   Breast cancer Maternal Grandmother    Breast cancer Cousin     CURRENT MEDICATIONS:  Current Outpatient Medications  Medication Sig Dispense Refill   albuterol (PROVENTIL) (2.5 MG/3ML) 0.083% nebulizer solution Take 2.5 mg by nebulization every 6 (six) hours as needed for wheezing or shortness of breath.     albuterol (VENTOLIN HFA) 108 (90 Base) MCG/ACT inhaler Inhale 1-2 puffs into the lungs every 4 (four) hours as needed for wheezing or shortness of breath. 18 g 0   ALPRAZolam (XANAX) 1 MG tablet Take 1-2 tablets (1-2 mg total) by mouth as directed. Take 1-2 mg prior to your radiology tag placement appointment. 4 tablet 0   Apoaequorin (PREVAGEN) 10 MG CAPS Take 10 mg by mouth daily.     chlorthalidone (HYGROTON) 50 MG tablet Take 1 tablet (50 mg total) by mouth daily. 90 tablet 3   Cholecalciferol (DIALYVITE VITAMIN D 5000) 125 MCG (5000 UT) capsule Take 5,000 Units by mouth daily.     cycloSPORINE (RESTASIS) 0.05 % ophthalmic emulsion Place 1 drop into both eyes 2 (two) times daily.     diltiazem (CARDIZEM) 30 MG tablet Take 1 tablet (30 mg total) by mouth 2 (two) times daily. (MAY TAKE AN ADDITIONAL TAB AS NEEDED FOR PALPITATIONS) 270 tablet 3   furosemide (LASIX) 20 MG tablet Take 1 tablet daily as needed may take 2 tablets prn (Patient taking differently: Take 20-40 mg by mouth daily as needed for edema.) 60 tablet 1   glipiZIDE-metformin (METAGLIP) 5-500 MG tablet Take 1 tablet by mouth  in the morning and at bedtime.     ibuprofen (ADVIL) 800 MG tablet Take 1 tablet (800 mg total) by mouth every 8 (eight) hours as needed. 90 tablet 1   Insulin Glargine (BASAGLAR KWIKPEN) 100 UNIT/ML Inject 15 Units into the skin at bedtime as needed (blood sugar over 150).     isosorbide mononitrate (IMDUR) 30 MG 24 hr tablet TAKE 1 TABLET (30 MG TOTAL) BY MOUTH DAILY. 90 tablet 0   lidocaine (LIDODERM) 5 % Place 2 patches onto the skin daily. Remove & Discard patch within 12 hours or as directed by MD (Patient taking differently: Place 2 patches onto the skin daily as needed (pain). Remove & Discard patch within 12 hours or as directed by MD) 60 patch 1   losartan (COZAAR) 100 MG tablet Take 100 mg by mouth daily.     metoprolol tartrate (LOPRESSOR) 100 MG  tablet Take 100 mg by mouth 2 (two) times daily.     Multiple Vitamin (MULTIVITAMIN WITH MINERALS) TABS tablet Take 1 tablet by mouth daily.     ondansetron (ZOFRAN) 4 MG tablet Take 1 tablet (4 mg total) by mouth every 8 (eight) hours as needed for nausea or vomiting. 12 tablet 0   oxyCODONE (ROXICODONE) 5 MG immediate release tablet Take 1 tablet (5 mg total) by mouth every 4 (four) hours as needed for severe pain or breakthrough pain. 10 tablet 0   potassium chloride SA (KLOR-CON) 20 MEQ tablet Take 2 tablets (40 mEq total) by mouth daily. 60 tablet 3   RELION PEN NEEDLES 31G X 6 MM MISC USE 1 PEN NEEDLE TO INJECT INSULIN AT BEDTIME     RELION PEN NEEDLES 32G X 4 MM MISC      tiZANidine (ZANAFLEX) 4 MG tablet Take 1 tablet (4 mg total) by mouth daily. (Patient taking differently: Take 4 mg by mouth at bedtime.) 30 tablet 1   TURMERIC PO Take 1,000 mg by mouth daily.     No current facility-administered medications for this visit.    ALLERGIES:  Allergies  Allergen Reactions   Other Swelling    [place] lipstick    PHYSICAL EXAM:  Performance status (ECOG): 1 - Symptomatic but completely ambulatory  There were no vitals filed for this  visit. Wt Readings from Last 3 Encounters:  01/26/21 229 lb (103.9 kg)  01/20/21 223 lb (101.2 kg)  01/05/21 223 lb (101.2 kg)   Physical Exam Vitals reviewed.  Constitutional:      Appearance: Normal appearance.  Cardiovascular:     Rate and Rhythm: Normal rate and regular rhythm.     Pulses: Normal pulses.     Heart sounds: Normal heart sounds.  Pulmonary:     Effort: Pulmonary effort is normal.     Breath sounds: Normal breath sounds.  Neurological:     General: No focal deficit present.     Mental Status: She is alert and oriented to person, place, and time.  Psychiatric:        Mood and Affect: Mood normal.        Behavior: Behavior normal.     LABORATORY DATA:  I have reviewed the labs as listed.  CBC Latest Ref Rng & Units 01/27/2021 01/26/2021 09/15/2020  WBC 4.0 - 10.5 K/uL 2.9(L) 3.9(L) 3.6(L)  Hemoglobin 12.0 - 15.0 g/dL 11.6(L) 11.7(L) 11.6(L)  Hematocrit 36.0 - 46.0 % 35.7(L) 35.2(L) 33.7(L)  Platelets 150 - 400 K/uL 268 263 310   CMP Latest Ref Rng & Units 01/27/2021 01/26/2021 01/20/2021  Glucose 70 - 99 mg/dL 141(H) 120(H) 93  BUN 8 - 23 mg/dL $Remove'15 16 14  'IfoqRnp$ Creatinine 0.44 - 1.00 mg/dL 0.79 0.88 0.77  Sodium 135 - 145 mmol/L 138 137 139  Potassium 3.5 - 5.1 mmol/L 3.9 3.6 3.5  Chloride 98 - 111 mmol/L 100 100 102  CO2 22 - 32 mmol/L $RemoveB'30 30 30  'obpUPUvQ$ Calcium 8.9 - 10.3 mg/dL 9.0 9.0 9.6  Total Protein 6.5 - 8.1 g/dL 7.8 7.7 -  Total Bilirubin 0.3 - 1.2 mg/dL 0.8 0.5 -  Alkaline Phos 38 - 126 U/L 66 66 -  AST 15 - 41 U/L 22 22 -  ALT 0 - 44 U/L 28 28 -    DIAGNOSTIC IMAGING:  I have independently reviewed the scans and discussed with the patient. DG Chest 2 View  Result Date: 01/26/2021 CLINICAL DATA:  Chest pain EXAM:  CHEST - 2 VIEW COMPARISON:  11/13/2019 FINDINGS: Bibasilar atelectasis. Heart is normal size. No effusions or acute bony abnormality. IMPRESSION: Bibasilar atelectasis. Electronically Signed   By: Rolm Baptise M.D.   On: 01/26/2021 11:39   CT  Angio Chest PE W and/or Wo Contrast  Result Date: 01/26/2021 CLINICAL DATA:  PE suspected.  High probability. EXAM: CT ANGIOGRAPHY CHEST WITH CONTRAST TECHNIQUE: Multidetector CT imaging of the chest was performed using the standard protocol during bolus administration of intravenous contrast. Multiplanar CT image reconstructions and MIPs were obtained to evaluate the vascular anatomy. CONTRAST:  37mL OMNIPAQUE IOHEXOL 350 MG/ML SOLN COMPARISON:  Chest radiograph, concurrent.  CT chest, 09/12/2020. FINDINGS: Cardiovascular: Satisfactory opacification of the pulmonary arteries to the segmental level. No evidence of segmental or larger pulmonary embolism. Normal heart size. No pericardial effusion. Mediastinum/Nodes: No enlarged mediastinal, hilar, or axillary lymph nodes. Enlarged, nodular RIGHT thyroid gland., trachea, and esophagus demonstrate no significant findings. Lungs/Pleura: Lungs are clear. Trace linear basilar atelectasis. Punctate RIGHT basilar calcified granuloma. No suspicious pulmonary nodule or mass. No pleural effusion or pneumothorax. Upper Abdomen: No acute abnormality. Incidental, colonic interposition at the LEFT upper quadrant. Musculoskeletal: 3.7 x 2.9 cm RIGHT retro areolar collection with air-fluid level (see key image). No acute osseous abnormality. Review of the MIP images confirms the above findings. IMPRESSION: 1. No segmental or larger pulmonary embolus. 2. 4 cm RIGHT retro areolar air-fluid collection. Correlate with potential recent breast instrumentation post surgery. Differential diagnosis includes breast abscess. 3. Enlarged, nodular RIGHT thyroid gland. Recommend nonemergent, outpatient thyroid ultrasound further evaluation if not already performed. These results will be called to the ordering clinician or representative by the Radiologist Assistant, and communication documented in the PACS or [employer]. Michaelle Birks, MD [facility]  [facility] Radiology Electronically Signed   By: Michaelle Birks M.D.   On: 01/26/2021 14:25   MM BREAST SURGICAL SPECIMEN  Result Date: 01/25/2021 CLINICAL DATA:  Right EXAM: SPECIMEN RADIOGRAPH OF THE RIGHT BREAST COMPARISON:  Previous exam(s). FINDINGS: Status post excision of the right breast. The radiofrequency tag and ribbon shaped clip are present within the specimen. IMPRESSION: Specimen radiograph of the right breast. Electronically Signed   By: Dorise Bullion III M.D.   On: 01/25/2021 08:35  MM DIAG BREAST TOMO UNI RIGHT  Result Date: 01/19/2021 CLINICAL DATA:  Post ultrasound-guided RADIOFREQUENCY tag placement in the right breast at 3 o'clock subareolar at site of biopsy-proven intraductal papilloma. EXAM: DIAGNOSTIC RIGHT MAMMOGRAM POST ULTRASOUND-GUIDED RADIOFREQUENCY TAG PLACEMENT NEEDLE LOCALIZATION OF THE RIGHT BREAST WITH ULTRASOUND GUIDANCE COMPARISON:  Previous exams. FINDINGS: Mammographic images were obtained following ultrasound-guided RF tag placement. The RF tag is present at the site of the biopsied mass in the right breast at 3 o'clock subareolar and adjacent to ribbon shaped biopsy marking clip. IMPRESSION: Appropriate location of the RF tag. Final Assessment: Post Procedure Mammograms for Seed Placement Electronically Signed   By: Everlean Alstrom M.D.   On: 01/19/2021 10:06  [place] RT RADIO FREQUENCY TAG LOC US GUIDE  Result Date: 01/19/2021 CLINICAL DATA:  65 year old female with recently diagnosed papilloma in the right breast at 3 o'clock subareolar presents for preoperative RF tag localization EXAM: RF TAG LOCALIZATION OF THE RIGHT BREAST WITH ULTRASOUND GUIDANCE COMPARISON:  Previous exams. FINDINGS: Patient presents for needle localization prior to right breast excision. I met with the patient and we discussed the procedure of needle localization including benefits and alternatives. We discussed the high likelihood of a successful procedure. We  discussed the risks of the  procedure, including infection, bleeding, tissue injury, and further surgery. Informed, written consent was given. The usual time-out protocol was performed immediately prior to the procedure. Using ultrasound guidance, sterile technique, 1% lidocaine and a 7 RF tag needle, the small mass in the right breast at 3 o'clock subareolar was localized using inferior to superior approach. The images were marked for Dr. Constance Cooper. IMPRESSION: Radiofrequency tag localization right breast. No apparent complications. Electronically Signed   By: Everlean Alstrom M.D.   On: 01/19/2021 10:07    ASSESSMENT:  1.  Plasmacytoma of L4 vertebral body: -MRI lumbar spine shows L4 lesion. -PET scan on 06/10/2019 shows mildly increased FDG uptake associated with mixed lytic/sclerotic lesion involving L4 vertebral body SUV 4.4. -Serum immunofixation shows IgG kappa monoclonal protein.  SPEP-no M spike.  Kappa light chains elevated at 25.3.  Ratio 1.12.  Lambda light chains 21.9.  Beta-2 microglobulin 2.2. -24-hour urine was negative for UPEP and immunofixation.  Protein was 130 mg. -L4 needle biopsy on 06/19/2019 showed minute segments of the bone and soft tissue, limited cellularity.  IHC for CD138 highlights scattering plasma cells.  Cytokeratin is negative.  Few kappa positive plasma cells present.  Overall material is very limited and essentially nondiagnostic. -Clinically this is consistent with plasmacytoma.  We reviewed bone marrow biopsy results which showed normocellular marrow.  Cytogenetics are normal. -XRT to L4 vertebral body from 07/16/2019 through 08/22/2019. -MRI of the lumbar spine on 12/03/2019 showed unchanged size of the L4 vertebral body lesion, slightly decreased contrast-enhancement with no new lesions.   2.  Low back pain: -She is taking half tablet of hydrocodone 10/325 every 6 hours as needed which is helping. -Likely this will improve upon completion of radiation.  3.  Right breast intraductal  papilloma: - Biopsy on 12/01/2020 at 3 o'clock position of the right breast with intraductal papilloma with florid usual ductal hyperplasia and sclerosing fibrosis. - Right breast lumpectomy on 01/25/2021 with intraductal papilloma with florid UDH and calcifications 1.2 cm.  Margins negative.   PLAN:  1.  Plasmacytoma of L4 vertebral body: -We have reviewed labs from 01/27/2021. - SPEP showed 0.4 g of M spike for the first time. - Kappa light chains at 35.6, lambda light chain 17.6 and ratio is elevated at 2.02. - She does not have any "crab" features.  Creatinine is 0.7 and calcium 9.  Hemoglobin is stable around 11.6.  It is likely that she is developing MGUS. - I plan to repeat her SPEP, immunofixation and free light chains along with routine labs in 3 months.   2.  Low back pain: -She reports that her lower back pain is stable. - She is taking hydrocodone 5/325 up to 2 tablets daily.  Yesterday she did not require any. - She reports some drowsiness after taking the pain medication.   3.  Severe hypokalemia: -Continue potassium 40 mEq daily.  Potassium today is 3.9.  4.  Right breast intraductal papilloma and florid UDH: - We reviewed pathology reports and discussed diagnosis. - Says still has some tenderness in the breast area and slight pain. - No further management is needed.   Orders placed this encounter:  No orders of the defined types were placed in this encounter.    Derek Jack, MD [place] [phone]   I, Thana Ates, am acting as a scribe for Dr. Derek Jack.  I, Derek Jack MD, have reviewed the above documentation for accuracy and completeness, and  I agree with the above.

## 2021-02-03 ENCOUNTER — Other Ambulatory Visit: Payer: Self-pay

## 2021-02-03 ENCOUNTER — Inpatient Hospital Stay (HOSPITAL_BASED_OUTPATIENT_CLINIC_OR_DEPARTMENT_OTHER): Payer: HMO | Admitting: Hematology

## 2021-02-03 ENCOUNTER — Telehealth: Payer: Self-pay | Admitting: *Deleted

## 2021-02-03 VITALS — BP 137/85 | HR 65 | Temp 96.8°F | Resp 18 | Wt 229.9 lb

## 2021-02-03 DIAGNOSIS — C9030 Solitary plasmacytoma not having achieved remission: Secondary | ICD-10-CM

## 2021-02-03 DIAGNOSIS — M545 Low back pain, unspecified: Secondary | ICD-10-CM | POA: Diagnosis not present

## 2021-02-03 NOTE — Patient Instructions (Addendum)
[place] at [facility] Discharge Instructions  You were seen and examined toady by Dr. Delton Coombes. Dr. Delton Coombes discussed your recent lab results. On your SPEP, which is the lab test for Multiple Myeloma, revealed a M-Spike of 0.4. This is not highly concerning, but it needs to be rechecked in about 3 months.  Follow-up as scheduled.   Thank you for choosing [place] at [facility] to provide your oncology and hematology care.  To afford each patient quality time with our provider, please arrive at least 15 minutes before your scheduled appointment time.   If you have a lab appointment with the [facility] please come in thru the Main Entrance and check in at the main information desk.  You need to re-schedule your appointment should you arrive 10 or more minutes late.  We strive to give you quality time with our providers, and arriving late affects you and other patients whose appointments are after yours.  Also, if you no show three or more times for appointments you may be dismissed from the clinic at the providers discretion.     Again, thank you for choosing [facility].  Our hope is that these requests will decrease the amount of time that you wait before being seen by our physicians.       _____________________________________________________________  Should you have questions after your visit to [facility], please contact our office at [phone] and follow the prompts.  Our office hours are 8:00 a.m. and 4:30 p.m. Monday - Friday.  Please note that voicemails left after 4:00 p.m. may not be returned until the following business day.  We are closed weekends and major holidays.  You do have access to a nurse 24-7, just call the main number to the clinic [phone] and do not press any options, hold on the line and a nurse will answer the phone.    For prescription refill requests, have your pharmacy  contact our office and allow 72 hours.    Due to Covid, you will need to wear a mask upon entering the hospital. If you do not have a mask, a mask will be given to you at the Main Entrance upon arrival. For doctor visits, patients may have 1 support person age 48 or older with them. For treatment visits, patients can not have anyone with them due to social distancing guidelines and our immunocompromised population.

## 2021-02-03 NOTE — Telephone Encounter (Signed)
RETURNED PATIENT'S PHONE CALL, SPOKE WITH PATIENT. ?

## 2021-02-04 ENCOUNTER — Ambulatory Visit: Payer: HMO | Admitting: Radiation Oncology

## 2021-02-10 NOTE — Progress Notes (Signed)
Radiation Oncology         [phone] ________________________________  Name: Hannah Cooper MRN: [id number]  Date: 02/11/2021  DOB: 1955/10/28  Follow-Up Visit Note  CC: Avis Epley, PA-C  Avis Epley, [place]*    ICD-10-CM   1. 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 Since her last visit, the patient presented for a routine screening mammogram on 11/05/20 which demonstrated a possible mass in the right breast. Diagnostic right mammogram and right breast US on 11/24/20 further demonstrated a suspicious 9 mm mass in the 3 o'clock retroareolar right breast.  No evidence of right axillary lymphadenopathy was seen.  DXA for bone mineral density on 11/26/20 revealed the patient to be considered osteopenic, with a femoral neck T score of -1.3.  Accordingly, the patient underwent right breast biopsy at the 3 o'clock position on 12/01/20. Pathology from the procedure revealed intraductal papilloma with florid usual ductal hyperplasia and sclerosing fibrosis.   The patient opted to proceed with right intraductal papilloma lumpectomy on 01/25/21 under the care of Dr. Henreitta Leber. Pathology from the procedure revealed intraductal papilloma with florid usual ductal hyperplasia and calcifications, measuring 1.2 cm. Margins not involved by carcinoma.   Following her procedure, the patient presented to the [facility] ED on 01/26/21 with sharp  chest pain. The patient reported at this time vomiting several times following her procedure. Patient stated that CP began the night prior in the center of her chest. CXR and chest CT were both negative for PE though did show  fluid collection in her breast. Also appreciated on imaging was a a nodular right thyroid gland. Patient was reccommended to get a non-emergent thyroid US.  Of note: the patient presented to the [facility] ED on 09/12/20: presented with 1 week of progressively worsening mid back pain. (To review: the patient has chronic back pain in her lumbar spine from plasmacytoma and bony tumor). The patient described the pain as different than her usual chronic pain and more superior. The patient also reported the pain as something that she has never had before. CTA performed during ED course demonstrated negative CTA of the aorta. CT of the lumbar and thoracic spine demonstrated unremarkable appearance of treated plasmacytoma at L4 and lower lumbar facet osteoarthritis and focal advanced L5-S1 disc degeneration. Degenerative changes were overall noted to have progressed from 2021, though per ED encounter note, the patient was noted to have no new bony lesions or explanation for her pain.   She continues to have pain in her lower back region for which she takes pain medication twice daily.  She denies any weakness in her left lower extremity.  She is ambulating without the assistance of a cane or walker at this time.  The past couple of weeks have been difficult for the patient as she lost her sister as well as her father.    Allergies:  is allergic to other.  Meds: Current Outpatient Medications  Medication Sig Dispense Refill   albuterol (PROVENTIL) (2.5 MG/3ML) 0.083% nebulizer solution Take 2.5 mg by nebulization every 6 (six) hours as needed for wheezing or shortness of breath.     albuterol (VENTOLIN HFA) 108 (90 Base) MCG/ACT inhaler Inhale 1-2 puffs into the lungs every 4 (four)  hours as needed for wheezing or shortness of breath. 18 g 0   Apoaequorin (PREVAGEN) 10 MG CAPS Take 10 mg by mouth daily.     Cholecalciferol (DIALYVITE VITAMIN D 5000) 125 MCG (5000 UT) capsule Take 5,000 Units by mouth daily.     cycloSPORINE (RESTASIS) 0.05 % ophthalmic emulsion Place 1 drop into both eyes 2 (two) times daily.     diltiazem (CARDIZEM) 30 MG tablet Take 1 tablet (30 mg total) by mouth 2 (two) times daily. (MAY TAKE AN ADDITIONAL TAB AS NEEDED FOR PALPITATIONS) 270 tablet 3   furosemide (LASIX) 20 MG tablet Take 1 tablet daily as needed may take 2 tablets prn (Patient taking differently: Take 20-40 mg by mouth daily as needed for edema.) 60 tablet 1   glipiZIDE-metformin (METAGLIP) 5-500 MG tablet Take 1 tablet by mouth in the morning and at bedtime.     ibuprofen (ADVIL) 800 MG tablet Take 1 tablet (800 mg total) by mouth every 8 (eight) hours as needed. 90 tablet 1   Insulin Glargine (BASAGLAR KWIKPEN) 100 UNIT/ML Inject 15 Units into the skin at bedtime as needed (blood sugar over 150).     isosorbide mononitrate (IMDUR) 30 MG 24 hr tablet TAKE 1 TABLET (30 MG TOTAL) BY MOUTH DAILY. 90 tablet 0   lidocaine (LIDODERM) 5 % Place 2 patches onto the skin daily. Remove & Discard patch within 12 hours or as directed by MD (Patient taking differently: Place 2 patches onto the skin daily as needed (pain). Remove & Discard patch within 12 hours or as directed by MD) 60 patch 1   losartan (COZAAR) 100 MG tablet Take 100 mg by mouth daily.     metoprolol tartrate (LOPRESSOR) 100 MG tablet Take 100 mg by mouth 2 (two) times daily.     Multiple Vitamin (MULTIVITAMIN WITH MINERALS) TABS tablet Take 1 tablet by mouth daily.     ondansetron (ZOFRAN) 4 MG tablet Take 1 tablet (4 mg total) by mouth every 8 (eight) hours as needed for nausea or vomiting. 12 tablet 0   oxyCODONE (ROXICODONE) 5 MG immediate release tablet Take 1 tablet (5 mg total) by mouth every 4 (four) hours as needed for severe  pain or breakthrough pain. 10 tablet 0   potassium chloride SA (KLOR-CON) 20 MEQ tablet Take 2 tablets (40 mEq total) by mouth daily. 60 tablet 3   RELION PEN NEEDLES 32G X 4 MM MISC      tiZANidine (ZANAFLEX) 4 MG tablet Take 1 tablet (4 mg total) by mouth daily. (Patient taking differently: Take 4 mg by mouth at bedtime.) 30 tablet 1   TURMERIC PO Take 1,000 mg by mouth daily.     ALPRAZolam (XANAX) 1 MG tablet Take 0.5-1 tablets (0.5-1 mg total) by mouth 3 (three) times daily as needed for anxiety or sleep. 20 tablet 0   chlorthalidone (HYGROTON) 50 MG tablet Take 1 tablet (50 mg total) by mouth daily. 90 tablet 3   No current facility-administered medications for this encounter.    Physical Findings: The patient is in no acute distress. Patient is alert and oriented.  height is _0  (1.651 m) and weight is 229 lb (103.9  kg). Her temperature is 98.5 F (36.9 C). Her blood pressure is 144/86 (abnormal) and her pulse is 65. Her respiration is 20 and oxygen saturation is 99%. .  No significant changes. Lungs are clear to auscultation bilaterally. Heart has regular rate and rhythm. No palpable cervical, supraclavicular, or axillary adenopathy. Abdomen soft, non-tender, normal bowel sounds. Lower motor strength is 5 out of 5 in the proximal and distal muscle groups.      Lab Findings: Lab Results  Component Value Date   WBC 2.9 (L) 01/27/2021   HGB 11.6 (L) 01/27/2021   HCT 35.7 (L) 01/27/2021   MCV 92.0 01/27/2021   PLT 268 01/27/2021    Radiographic Findings: DG Chest 2 View  Result Date: 01/26/2021 CLINICAL DATA:  Chest pain EXAM: CHEST - 2 VIEW COMPARISON:  11/13/2019 FINDINGS: Bibasilar atelectasis. Heart is normal size. No effusions or acute bony abnormality. IMPRESSION: Bibasilar atelectasis. Electronically Signed   By: Rolm Baptise M.D.   On: 01/26/2021 11:39   CT Angio Chest PE W and/or Wo Contrast  Result Date: 01/26/2021 CLINICAL DATA:  PE suspected.  High  probability. EXAM: CT ANGIOGRAPHY CHEST WITH CONTRAST TECHNIQUE: Multidetector CT imaging of the chest was performed using the standard protocol during bolus administration of intravenous contrast. Multiplanar CT image reconstructions and MIPs were obtained to evaluate the vascular anatomy. CONTRAST:  15m OMNIPAQUE IOHEXOL 350 MG/ML SOLN COMPARISON:  Chest radiograph, concurrent.  CT chest, 09/12/2020. FINDINGS: Cardiovascular: Satisfactory opacification of the pulmonary arteries to the segmental level. No evidence of segmental or larger pulmonary embolism. Normal heart size. No pericardial effusion. Mediastinum/Nodes: No enlarged mediastinal, hilar, or axillary lymph nodes. Enlarged, nodular RIGHT thyroid gland., trachea, and esophagus demonstrate no significant findings. Lungs/Pleura: Lungs are clear. Trace linear basilar atelectasis. Punctate RIGHT basilar calcified granuloma. No suspicious pulmonary nodule or mass. No pleural effusion or pneumothorax. Upper Abdomen: No acute abnormality. Incidental, colonic interposition at the LEFT upper quadrant. Musculoskeletal: 3.7 x 2.9 cm RIGHT retro areolar collection with air-fluid level (see key image). No acute osseous abnormality. Review of the MIP images confirms the above findings. IMPRESSION: 1. No segmental or larger pulmonary embolus. 2. 4 cm RIGHT retro areolar air-fluid collection. Correlate with potential recent breast instrumentation post surgery. Differential diagnosis includes breast abscess. 3. Enlarged, nodular RIGHT thyroid gland. Recommend nonemergent, outpatient thyroid ultrasound further evaluation if not already performed. These results will be called to the ordering clinician or representative by the Radiologist Assistant, and communication documented in the PACS or [employer] JMichaelle Birks MD [facility] Electronically Signed   By: JMichaelle BirksM.D.   On: 01/26/2021 14:25   MM  BREAST SURGICAL SPECIMEN  Result Date: 01/25/2021 CLINICAL DATA:  Right EXAM: SPECIMEN RADIOGRAPH OF THE RIGHT BREAST COMPARISON:  Previous exam(s). FINDINGS: Status post excision of the right breast. The radiofrequency tag and ribbon shaped clip are present within the specimen. IMPRESSION: Specimen radiograph of the right breast. Electronically Signed   By: DDorise BullionIII M.D.   On: 01/25/2021 08:35  MM DIAG BREAST TOMO UNI RIGHT  Result Date: 01/19/2021 CLINICAL DATA:  Post ultrasound-guided RADIOFREQUENCY tag placement in the right breast at 3 o'clock subareolar at site of biopsy-proven intraductal papilloma. EXAM: DIAGNOSTIC RIGHT MAMMOGRAM POST ULTRASOUND-GUIDED RADIOFREQUENCY TAG PLACEMENT NEEDLE LOCALIZATION OF THE RIGHT BREAST WITH ULTRASOUND GUIDANCE COMPARISON:  Previous exams. FINDINGS: Mammographic images were obtained following ultrasound-guided RF tag placement. The RF tag is present at the site of the biopsied mass in  the right breast at 3 o'clock subareolar and adjacent to ribbon shaped biopsy marking clip. IMPRESSION: Appropriate location of the RF tag. Final Assessment: Post Procedure Mammograms for Seed Placement Electronically Signed   By: Everlean Alstrom M.D.   On: 01/19/2021 10:06  [place] RT RADIO FREQUENCY TAG LOC US GUIDE  Result Date: 01/19/2021 CLINICAL DATA:  65 year old female with recently diagnosed papilloma in the right breast at 3 o'clock subareolar presents for preoperative RF tag localization EXAM: RF TAG LOCALIZATION OF THE RIGHT BREAST WITH ULTRASOUND GUIDANCE COMPARISON:  Previous exams. FINDINGS: Patient presents for needle localization prior to right breast excision. I met with the patient and we discussed the procedure of needle localization including benefits and alternatives. We discussed the high likelihood of a successful procedure. We discussed the risks of the procedure, including infection, bleeding, tissue injury, and further surgery. Informed, written  consent was given. The usual time-out protocol was performed immediately prior to the procedure. Using ultrasound guidance, sterile technique, 1% lidocaine and a 7 RF tag needle, the small mass in the right breast at 3 o'clock subareolar was localized using inferior to superior approach. The images were marked for Dr. Constance Haw. IMPRESSION: Radiofrequency tag localization right breast. No apparent complications. Electronically Signed   By: Everlean Alstrom M.D.   On: 01/19/2021 10:07   Impression:  Plasmocytoma of L4 vertebral body, with new diagnosis of right breast intraductal papilloma with florid usual ductal hyperplasia  Overall the patient's pain in the lower back area is better but she does require prescription pain medication for management of this issue.  Plan: Routine follow-up in 6 months.  The patient will see Dr. Delton Coombes in January with blood work.  She will follow-up with her surgeon later today concerning her recent lumpectomy.   20 minutes of total time was spent for this patient encounter, including preparation, face-to-face counseling with the patient and coordination of care, physical exam, and documentation of the encounter. ____________________________________  Blair Promise, PhD, MD   This document serves as a record of services personally performed by Gery Pray, MD. It was created on his behalf by Roney Mans, a trained medical scribe. The creation of this record is based on the scribe's personal observations and the provider's statements to them. This document has been checked and approved by the attending provider.

## 2021-02-11 ENCOUNTER — Ambulatory Visit
Admission: RE | Admit: 2021-02-11 | Discharge: 2021-02-11 | Disposition: A | Discharge status: Home / Self Care | Payer: HMO | Source: Ambulatory Visit | Attending: Radiation Oncology | Admitting: Radiation Oncology

## 2021-02-11 ENCOUNTER — Other Ambulatory Visit: Payer: Self-pay

## 2021-02-11 ENCOUNTER — Ambulatory Visit (INDEPENDENT_AMBULATORY_CARE_PROVIDER_SITE_OTHER): Payer: HMO | Admitting: General Surgery

## 2021-02-11 ENCOUNTER — Encounter: Payer: Self-pay | Admitting: Radiation Oncology

## 2021-02-11 VITALS — BP 126/80 | HR 68 | Temp 98.6°F | Resp 14 | Ht 65.0 in | Wt 230.0 lb

## 2021-02-11 VITALS — BP 144/86 | HR 65 | Temp 98.5°F | Resp 20 | Ht 65.0 in | Wt 229.0 lb

## 2021-02-11 DIAGNOSIS — Z79899 Other long term (current) drug therapy: Secondary | ICD-10-CM | POA: Diagnosis not present

## 2021-02-11 DIAGNOSIS — N6341 Unspecified lump in right breast, subareolar: Secondary | ICD-10-CM | POA: Diagnosis not present

## 2021-02-11 DIAGNOSIS — Z923 Personal history of irradiation: Secondary | ICD-10-CM | POA: Diagnosis not present

## 2021-02-11 DIAGNOSIS — M545 Low back pain, unspecified: Secondary | ICD-10-CM | POA: Insufficient documentation

## 2021-02-11 DIAGNOSIS — C9030 Solitary plasmacytoma not having achieved remission: Secondary | ICD-10-CM | POA: Diagnosis not present

## 2021-02-11 DIAGNOSIS — D241 Benign neoplasm of right breast: Secondary | ICD-10-CM

## 2021-02-11 DIAGNOSIS — Z08 Encounter for follow-up examination after completed treatment for malignant neoplasm: Secondary | ICD-10-CM | POA: Diagnosis not present

## 2021-02-11 MED ORDER — HYDROCODONE-ACETAMINOPHEN 10-325 MG PO TABS: 1.0000 | ORAL_TABLET | Freq: Two times a day (BID) | ORAL | 0 refills | Status: DC | PRN

## 2021-02-11 MED ORDER — ALPRAZOLAM 1 MG PO TABS: 0.5000 mg | ORAL_TABLET | Freq: Three times a day (TID) | ORAL | 0 refills | Status: DC | PRN

## 2021-02-11 NOTE — Progress Notes (Signed)
Hannah Cooper is here today for follow up post radiation to the lumbar spine.   They completed their radiation on: 08/22/2019   Does the patient complain of any of the following: Appetite good/fair/poor: good Pain on a scale of 0-10 is: 7 to low back Bowel/Bladder retention or incontinence (please describe): constipation Numbness or weakness in extremities (please describe): denies Current Decadron regimen, if applicable: n/a Ambulatory status? Walker? Wheelchair?: Ambulatory  Additional comments if applicable: fatigue  Vitals:   02/11/21 0837  BP: (!) 144/86  Pulse: 65  Resp: 20  Temp: 98.5 F (36.9 C)  SpO2: 99%  Weight: 229 lb (103.9 kg)  Height: 5\' 5"  (1.651 m)

## 2021-02-11 NOTE — Patient Instructions (Signed)
Activity as tolerated. Breast incision is healing. Get some rest and relax, try meditation to help with anxiety. Xanax 1/2 pill to 1 pill up to three times a day for anxiety and sleep sent to [facility].
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
## 2021-02-15 ENCOUNTER — Ambulatory Visit (INDEPENDENT_AMBULATORY_CARE_PROVIDER_SITE_OTHER): Payer: PRIVATE HEALTH INSURANCE | Admitting: Orthopedic Surgery

## 2021-02-15 ENCOUNTER — Encounter: Payer: Self-pay | Admitting: Orthopedic Surgery

## 2021-02-15 ENCOUNTER — Other Ambulatory Visit: Payer: Self-pay

## 2021-02-15 VITALS — BP 121/59 | HR 83 | Ht 65.0 in | Wt 228.8 lb

## 2021-02-15 DIAGNOSIS — M25512 Pain in left shoulder: Secondary | ICD-10-CM

## 2021-02-15 DIAGNOSIS — G8929 Other chronic pain: Secondary | ICD-10-CM

## 2021-02-15 DIAGNOSIS — Z9889 Other specified postprocedural states: Secondary | ICD-10-CM

## 2021-02-15 NOTE — Patient Instructions (Addendum)
While we are working on your approval for MRI please go ahead and call to schedule your appointment with [facility] within at least one (1) week.    [phone]

## 2021-02-15 NOTE — Progress Notes (Signed)
Chief Complaint  Patient presents with   Shoulder Pain    LEFT/ pt having some weakness   Encounter Diagnoses  Name Primary?   S/p left rotator cuff repair distal clavicle resection 07/03/20  Yes   Chronic left shoulder pain     65 year old female 7 month status post rotator cuff repair distal clavicle excision presents with pain left shoulder and weakness  Patient says she was doing very well until she reached for her grandchild who was falling off the bed and since that time she has had increased pain decreased range of motion and weakness in the left shoulder.  She has tried to do some of her exercises on occasion  We tried a self-directed home exercise program she still having weakness in abduction and some in flexion with pain    PATIENT:  Hannah Cooper [name]  65 y.o. female  PRE-OPERATIVE DIAGNOSIS:  left rotator cuff tear  POST-OPERATIVE DIAGNOSIS:  left rotator cuff tear  PROCEDURE:  Procedure(s): OPEN ROTATOR CUFF REPAIR LEFT SHOULDER  OPEN WITH ACROMIOPLASTY AND  RESECTION DISTAL CLAVICAL   FINDINGS: Torn rotator cuff supraspinatus tendon full-thickness tear.  Delamination was noted.  1-1/2 cm front to back.  It was a U-shaped tear.  Hypertrophy of the acromion with grade 4 degenerative arthritis of the distal clavicle and inferior osteophyte.  Implants: [employer] speed bridge   Focused left shoulder exam  Abduction weakness especially against resistance grade 3  Flexion weakness 4 out of 5  Range of motion is painful as well  Recommend repeat MRI to check repair for retear versus bursitis

## 2021-02-22 ENCOUNTER — Telehealth: Payer: Self-pay

## 2021-02-22 NOTE — Telephone Encounter (Signed)
Patient called stating that [facility] has not received her referral. They told her to have it faxed to [phone].

## 2021-02-23 NOTE — Telephone Encounter (Signed)
Faxed again have already done this multiple times So has Arbie Cookey.  Asked them to call patient, will continue to fax until they receive.

## 2021-03-09 ENCOUNTER — Other Ambulatory Visit: Payer: Self-pay

## 2021-03-09 NOTE — Telephone Encounter (Signed)
Patient is asking for something for pain.  PATIENT USES [facility]

## 2021-03-25 ENCOUNTER — Ambulatory Visit: Payer: HMO | Admitting: Orthopedic Surgery

## 2021-03-26 DIAGNOSIS — J201 Acute bronchitis due to Hemophilus influenzae: Secondary | ICD-10-CM | POA: Diagnosis not present

## 2021-03-26 DIAGNOSIS — I10 Essential (primary) hypertension: Secondary | ICD-10-CM | POA: Diagnosis not present

## 2021-04-08 ENCOUNTER — Other Ambulatory Visit (HOSPITAL_COMMUNITY): Payer: Self-pay

## 2021-04-08 MED ORDER — HYDROCODONE-ACETAMINOPHEN 10-325 MG PO TABS: 1.0000 | ORAL_TABLET | Freq: Two times a day (BID) | ORAL | 0 refills | Status: DC | PRN

## 2021-04-11 ENCOUNTER — Encounter (HOSPITAL_COMMUNITY): Payer: Self-pay

## 2021-04-11 ENCOUNTER — Other Ambulatory Visit: Payer: Self-pay

## 2021-04-11 ENCOUNTER — Emergency Department (HOSPITAL_COMMUNITY): Payer: HMO

## 2021-04-11 ENCOUNTER — Emergency Department (HOSPITAL_COMMUNITY)
Admission: EM | Admit: 2021-04-11 | Discharge: 2021-04-11 | Disposition: A | Discharge status: Home / Self Care | Payer: HMO | Attending: Emergency Medicine | Admitting: Emergency Medicine

## 2021-04-11 DIAGNOSIS — I5032 Chronic diastolic (congestive) heart failure: Secondary | ICD-10-CM | POA: Diagnosis not present

## 2021-04-11 DIAGNOSIS — S8002XA Contusion of left knee, initial encounter: Secondary | ICD-10-CM | POA: Diagnosis not present

## 2021-04-11 DIAGNOSIS — S0990XA Unspecified injury of head, initial encounter: Secondary | ICD-10-CM | POA: Insufficient documentation

## 2021-04-11 DIAGNOSIS — Z85830 Personal history of malignant neoplasm of bone: Secondary | ICD-10-CM | POA: Insufficient documentation

## 2021-04-11 DIAGNOSIS — I110 Hypertensive heart disease with heart failure: Secondary | ICD-10-CM | POA: Insufficient documentation

## 2021-04-11 DIAGNOSIS — S8000XA Contusion of unspecified knee, initial encounter: Secondary | ICD-10-CM

## 2021-04-11 DIAGNOSIS — S8001XA Contusion of right knee, initial encounter: Secondary | ICD-10-CM | POA: Diagnosis not present

## 2021-04-11 DIAGNOSIS — Z79899 Other long term (current) drug therapy: Secondary | ICD-10-CM | POA: Diagnosis not present

## 2021-04-11 DIAGNOSIS — Z794 Long term (current) use of insulin: Secondary | ICD-10-CM | POA: Diagnosis not present

## 2021-04-11 DIAGNOSIS — M25562 Pain in left knee: Secondary | ICD-10-CM | POA: Diagnosis not present

## 2021-04-11 DIAGNOSIS — Z955 Presence of coronary angioplasty implant and graft: Secondary | ICD-10-CM | POA: Insufficient documentation

## 2021-04-11 DIAGNOSIS — S161XXA Strain of muscle, fascia and tendon at neck level, initial encounter: Secondary | ICD-10-CM | POA: Diagnosis not present

## 2021-04-11 DIAGNOSIS — Z7984 Long term (current) use of oral hypoglycemic drugs: Secondary | ICD-10-CM | POA: Diagnosis not present

## 2021-04-11 DIAGNOSIS — J449 Chronic obstructive pulmonary disease, unspecified: Secondary | ICD-10-CM | POA: Diagnosis not present

## 2021-04-11 DIAGNOSIS — M25561 Pain in right knee: Secondary | ICD-10-CM | POA: Diagnosis not present

## 2021-04-11 DIAGNOSIS — S80911A Unspecified superficial injury of right knee, initial encounter: Secondary | ICD-10-CM | POA: Diagnosis present

## 2021-04-11 MED ORDER — METHOCARBAMOL 500 MG PO TABS: 500.0000 mg | ORAL_TABLET | Freq: Two times a day (BID) | ORAL | 0 refills | Status: DC | PRN

## 2021-04-11 MED ORDER — NAPROXEN 500 MG PO TABS: 500.0000 mg | ORAL_TABLET | Freq: Two times a day (BID) | ORAL | 0 refills | Status: DC
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
## 2021-04-14 ENCOUNTER — Other Ambulatory Visit (HOSPITAL_COMMUNITY): Payer: Self-pay | Admitting: *Deleted

## 2021-04-14 DIAGNOSIS — S0990XA Unspecified injury of head, initial encounter: Secondary | ICD-10-CM | POA: Diagnosis not present

## 2021-04-14 DIAGNOSIS — S8002XA Contusion of left knee, initial encounter: Secondary | ICD-10-CM | POA: Diagnosis not present

## 2021-04-14 DIAGNOSIS — S161XXA Strain of muscle, fascia and tendon at neck level, initial encounter: Secondary | ICD-10-CM | POA: Diagnosis not present

## 2021-04-14 DIAGNOSIS — S8001XA Contusion of right knee, initial encounter: Secondary | ICD-10-CM | POA: Diagnosis not present

## 2021-04-14 MED ORDER — HYDROCODONE-ACETAMINOPHEN 10-325 MG PO TABS: 1.0000 | ORAL_TABLET | Freq: Two times a day (BID) | ORAL | 0 refills | Status: DC | PRN

## 2021-04-16 DIAGNOSIS — M25551 Pain in right hip: Secondary | ICD-10-CM | POA: Diagnosis not present

## 2021-04-16 DIAGNOSIS — Z6839 Body mass index (BMI) 39.0-39.9, adult: Secondary | ICD-10-CM | POA: Diagnosis not present

## 2021-04-16 DIAGNOSIS — I10 Essential (primary) hypertension: Secondary | ICD-10-CM | POA: Diagnosis not present

## 2021-04-16 DIAGNOSIS — S134XXA Sprain of ligaments of cervical spine, initial encounter: Secondary | ICD-10-CM | POA: Diagnosis not present

## 2021-04-16 DIAGNOSIS — M25561 Pain in right knee: Secondary | ICD-10-CM | POA: Diagnosis not present

## 2021-04-22 DIAGNOSIS — M19012 Primary osteoarthritis, left shoulder: Secondary | ICD-10-CM | POA: Diagnosis not present

## 2021-04-22 DIAGNOSIS — M75122 Complete rotator cuff tear or rupture of left shoulder, not specified as traumatic: Secondary | ICD-10-CM | POA: Diagnosis not present

## 2021-04-22 DIAGNOSIS — Z9889 Other specified postprocedural states: Secondary | ICD-10-CM | POA: Diagnosis not present

## 2021-04-22 DIAGNOSIS — M62512 Muscle wasting and atrophy, not elsewhere classified, left shoulder: Secondary | ICD-10-CM | POA: Diagnosis not present

## 2021-04-26 NOTE — Progress Notes (Signed)
MRI Shoulder Left  WO IV Contrast  Anatomical Region Laterality Modality  Shoulder -- Magnetic Resonance  Chest -- --  Arm -- --   Impression  IMPRESSION:  Status post rotator cuff repair with suture anchors in the greater tuberosity. Full-thickness, full width recurrent supraspinatus tendon tear with retraction and moderate muscle atrophy. Moderate muscle atrophy of the infraspinatus tendon.   Early/mild rotator cuff arthropathy of the glenohumeral joint   Long head biceps tendon not visualized consistent with full-thickness tear.   Electronically Signed by: Carrington Clamp on 04/23/2021 8:17 AM Narrative  TECHNIQUE: Multiplanar, multisequence MR imaging of the left shoulder without contrast.   COMPARISON: None   INDICATION: Other specified postprocedural states   FINDINGS:  . Bones: No evidence of fracture, marrow edema, or osteonecrosis.  . Soft tissues: No fluid collections or soft tissue mass. Small amount fluid in the subacromial subdeltoid bursa  . Glenohumeral joint: Mild joint space narrowing. Mild superior migration of the humeral head. Small joint effusion.  . Acromioclavicular joint: Status post distal clavicular resection and acromioplasty.   . Supraspinatus tendon: Status post rotator cuff repair with suture anchors in the greater tuberosity. Full-thickness, full width tear with retraction of the torn tendon to the level of the mid humeral head. Moderate muscle atrophy.  . Infraspinatus tendon: Tendinosis. Moderate muscle atrophy  . Subscapularis tendon: Intact.  . Teres minor tendon: Intact  . Long head of biceps tendon: Not visualized.  . Capsulolabral structures: Intact  . Articular cartilage: Intact   . Additional shoulder girdle muscles: Intact  . Additional findings: None  Procedure Note  Carrington Clamp, MD - 04/23/2021  Formatting of this note might be different from the original.  TECHNIQUE: Multiplanar, multisequence MR imaging of the left shoulder  without contrast.   COMPARISON: None   INDICATION: Other specified postprocedural states   FINDINGS:  . Bones: No evidence of fracture, marrow edema, or osteonecrosis.  . Soft tissues: No fluid collections or soft tissue mass. Small amount fluid in the subacromial subdeltoid bursa  . Glenohumeral joint: Mild joint space narrowing. Mild superior migration of the humeral head. Small joint effusion.  . Acromioclavicular joint: Status post distal clavicular resection and acromioplasty.   . Supraspinatus tendon: Status post rotator cuff repair with suture anchors in the greater tuberosity. Full-thickness, full width tear with retraction of the torn tendon to the level of the mid humeral head. Moderate muscle atrophy.  . Infraspinatus tendon: Tendinosis. Moderate muscle atrophy  . Subscapularis tendon: Intact.  . Teres minor tendon: Intact  . Long head of biceps tendon: Not visualized.  . Capsulolabral structures: Intact  . Articular cartilage: Intact   . Additional shoulder girdle muscles: Intact  . Additional findings: None     IMPRESSION:  Status post rotator cuff repair with suture anchors in the greater tuberosity. Full-thickness, full width recurrent supraspinatus tendon tear with retraction and moderate muscle atrophy. Moderate muscle atrophy of the infraspinatus tendon.   Early/mild rotator cuff arthropathy of the glenohumeral joint   Long head biceps tendon not visualized consistent with full-thickness tear.   Electronically Signed by: Carrington Clamp on 04/23/2021 8:17 AM Resulting Agency Comment  [id number] Exam End: 04/22/21 08:51

## 2021-04-28 ENCOUNTER — Encounter: Payer: Self-pay | Admitting: Orthopedic Surgery

## 2021-04-28 ENCOUNTER — Other Ambulatory Visit: Payer: Self-pay

## 2021-04-28 ENCOUNTER — Ambulatory Visit (INDEPENDENT_AMBULATORY_CARE_PROVIDER_SITE_OTHER): Payer: HMO | Admitting: Orthopedic Surgery

## 2021-04-28 DIAGNOSIS — M25512 Pain in left shoulder: Secondary | ICD-10-CM | POA: Diagnosis not present

## 2021-04-28 DIAGNOSIS — G8929 Other chronic pain: Secondary | ICD-10-CM | POA: Diagnosis not present

## 2021-04-28 DIAGNOSIS — Z9889 Other specified postprocedural states: Secondary | ICD-10-CM | POA: Diagnosis not present
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
## 2021-05-03 ENCOUNTER — Other Ambulatory Visit: Payer: Self-pay

## 2021-05-03 ENCOUNTER — Inpatient Hospital Stay (HOSPITAL_COMMUNITY): Payer: HMO | Attending: Hematology

## 2021-05-03 DIAGNOSIS — Z7984 Long term (current) use of oral hypoglycemic drugs: Secondary | ICD-10-CM | POA: Insufficient documentation

## 2021-05-03 DIAGNOSIS — I5032 Chronic diastolic (congestive) heart failure: Secondary | ICD-10-CM | POA: Insufficient documentation

## 2021-05-03 DIAGNOSIS — I110 Hypertensive heart disease with heart failure: Secondary | ICD-10-CM | POA: Insufficient documentation

## 2021-05-03 DIAGNOSIS — M545 Low back pain, unspecified: Secondary | ICD-10-CM | POA: Diagnosis not present

## 2021-05-03 DIAGNOSIS — C9030 Solitary plasmacytoma not having achieved remission: Secondary | ICD-10-CM

## 2021-05-03 DIAGNOSIS — D241 Benign neoplasm of right breast: Secondary | ICD-10-CM | POA: Diagnosis not present

## 2021-05-03 DIAGNOSIS — Z79899 Other long term (current) drug therapy: Secondary | ICD-10-CM | POA: Insufficient documentation

## 2021-05-03 DIAGNOSIS — E876 Hypokalemia: Secondary | ICD-10-CM | POA: Diagnosis not present

## 2021-05-03 LAB — CBC WITH DIFFERENTIAL/PLATELET
Abs Immature Granulocytes: 0.01 10*3/uL (ref 0.00–0.07)
Basophils Absolute: 0 10*3/uL (ref 0.0–0.1)
Basophils Relative: 0 %
Eosinophils Absolute: 0.2 10*3/uL (ref 0.0–0.5)
Eosinophils Relative: 4 %
HCT: 36.5 % (ref 36.0–46.0)
Hemoglobin: 11.8 g/dL — ABNORMAL LOW (ref 12.0–15.0)
Immature Granulocytes: 0 %
Lymphocytes Relative: 31 %
Lymphs Abs: 1.1 10*3/uL (ref 0.7–4.0)
MCH: 29.1 pg (ref 26.0–34.0)
MCHC: 32.3 g/dL (ref 30.0–36.0)
MCV: 90.1 fL (ref 80.0–100.0)
Monocytes Absolute: 0.5 10*3/uL (ref 0.1–1.0)
Monocytes Relative: 14 %
Neutro Abs: 1.8 10*3/uL (ref 1.7–7.7)
Neutrophils Relative %: 51 %
Platelets: 345 10*3/uL (ref 150–400)
RBC: 4.05 MIL/uL (ref 3.87–5.11)
RDW: 13.7 % (ref 11.5–15.5)
WBC: 3.5 10*3/uL — ABNORMAL LOW (ref 4.0–10.5)
nRBC: 0 % (ref 0.0–0.2)

## 2021-05-03 LAB — COMPREHENSIVE METABOLIC PANEL
ALT: 26 U/L (ref 0–44)
AST: 20 U/L (ref 15–41)
Albumin: 4.1 g/dL (ref 3.5–5.0)
Alkaline Phosphatase: 61 U/L (ref 38–126)
Anion gap: 8 (ref 5–15)
BUN: 15 mg/dL (ref 8–23)
CO2: 30 mmol/L (ref 22–32)
Calcium: 9.7 mg/dL (ref 8.9–10.3)
Chloride: 99 mmol/L (ref 98–111)
Creatinine, Ser: 0.87 mg/dL (ref 0.44–1.00)
GFR, Estimated: >60 mL/min (ref >60)
Glucose, Bld: 117 mg/dL — ABNORMAL HIGH (ref 70–99)
Potassium: 3.4 mmol/L — ABNORMAL LOW (ref 3.5–5.1)
Sodium: 137 mmol/L (ref 135–145)
Total Bilirubin: 0.6 mg/dL (ref 0.3–1.2)
Total Protein: 7.9 g/dL (ref 6.5–8.1)

## 2021-05-03 LAB — LACTATE DEHYDROGENASE: LDH: 159 U/L (ref 98–192)

## 2021-05-04 LAB — PROTEIN ELECTROPHORESIS, SERUM
A/G Ratio: 1 (ref 0.7–1.7)
Albumin ELP: 3.8 g/dL (ref 2.9–4.4)
Alpha-1-Globulin: 0.3 g/dL (ref 0.0–0.4)
Alpha-2-Globulin: 0.7 g/dL (ref 0.4–1.0)
Beta Globulin: 1.1 g/dL (ref 0.7–1.3)
Gamma Globulin: 1.7 g/dL (ref 0.4–1.8)
Globulin, Total: 3.8 g/dL (ref 2.2–3.9)
M-Spike, %: 0.5 g/dL — ABNORMAL HIGH
Total Protein ELP: 7.6 g/dL (ref 6.0–8.5)

## 2021-05-04 LAB — KAPPA/LAMBDA LIGHT CHAINS
Kappa free light chain: 41 mg/L — ABNORMAL HIGH (ref 3.3–19.4)
Kappa, lambda light chain ratio: 1.58 (ref 0.26–1.65)
Lambda free light chains: 26 mg/L (ref 5.7–26.3)

## 2021-05-07 LAB — IMMUNOFIXATION ELECTROPHORESIS
IgA: 245 mg/dL (ref 87–352)
IgG (Immunoglobin G), Serum: 1605 mg/dL — ABNORMAL HIGH (ref 586–1602)
IgM (Immunoglobulin M), Srm: 146 mg/dL (ref 26–217)
Total Protein ELP: 7.6 g/dL (ref 6.0–8.5)

## 2021-05-10 ENCOUNTER — Inpatient Hospital Stay (HOSPITAL_BASED_OUTPATIENT_CLINIC_OR_DEPARTMENT_OTHER): Payer: HMO | Admitting: Hematology

## 2021-05-10 ENCOUNTER — Other Ambulatory Visit (HOSPITAL_COMMUNITY): Payer: Self-pay | Admitting: *Deleted

## 2021-05-10 ENCOUNTER — Other Ambulatory Visit: Payer: Self-pay

## 2021-05-10 VITALS — BP 132/87 | HR 64 | Temp 97.0°F | Resp 20 | Ht 65.0 in | Wt 222.9 lb

## 2021-05-10 DIAGNOSIS — D472 Monoclonal gammopathy: Secondary | ICD-10-CM | POA: Diagnosis not present

## 2021-05-10 DIAGNOSIS — C9030 Solitary plasmacytoma not having achieved remission: Secondary | ICD-10-CM

## 2021-05-10 DIAGNOSIS — M545 Low back pain, unspecified: Secondary | ICD-10-CM | POA: Diagnosis not present

## 2021-05-10 MED ORDER — OXYCODONE HCL 5 MG PO TABS: 5.0000 mg | ORAL_TABLET | Freq: Every day | ORAL | 0 refills | Status: DC | PRN
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
## 2021-05-10 NOTE — Patient Instructions (Addendum)
[place] at [facility] Discharge Instructions   You were seen and examined today by Dr. Delton Coombes.  He reviewed the results of your lab work.  Your m-spike has increased slightly.  All other lab work was normal/stable.  We will just continue to monitor your blood work for now.  Return as scheduled in 6 months.    Thank you for choosing [facility] at [facility] to provide your oncology and hematology care.  To afford each patient quality time with our provider, please arrive at least 15 minutes before your scheduled appointment time.   If you have a lab appointment with the [facility] please come in thru the Main Entrance and check in at the main information desk.  You need to re-schedule your appointment should you arrive 10 or more minutes late.  We strive to give you quality time with our providers, and arriving late affects you and other patients whose appointments are after yours.  Also, if you no show three or more times for appointments you may be dismissed from the clinic at the providers discretion.     Again, thank you for choosing [facility].  Our hope is that these requests will decrease the amount of time that you wait before being seen by our physicians.       _____________________________________________________________  Should you have questions after your visit to [facility], please contact our office at [phone] and follow the prompts.  Our office hours are 8:00 a.m. and 4:30 p.m. Monday - Friday.  Please note that voicemails left after 4:00 p.m. may not be returned until the following business day.  We are closed weekends and major holidays.  You do have access to a nurse 24-7, just call the main number to the clinic [phone] and do not press any options, hold on the line and a nurse will answer the phone.    For prescription refill requests, have your pharmacy contact our office and  allow 72 hours.    Due to Covid, you will need to wear a mask upon entering the hospital. If you do not have a mask, a mask will be given to you at the Main Entrance upon arrival. For doctor visits, patients may have 1 support person age 80 or older with them. For treatment visits, patients can not have anyone with them due to social distancing guidelines and our immunocompromised population.

## 2021-05-19 ENCOUNTER — Encounter (HOSPITAL_COMMUNITY): Payer: Self-pay | Admitting: Physical Therapy

## 2021-05-19 ENCOUNTER — Other Ambulatory Visit: Payer: Self-pay

## 2021-05-19 ENCOUNTER — Ambulatory Visit (HOSPITAL_COMMUNITY): Payer: 59 | Attending: Family Medicine | Admitting: Physical Therapy

## 2021-05-19 DIAGNOSIS — M6281 Muscle weakness (generalized): Secondary | ICD-10-CM | POA: Insufficient documentation

## 2021-05-19 DIAGNOSIS — M542 Cervicalgia: Secondary | ICD-10-CM | POA: Diagnosis not present

## 2021-05-19 DIAGNOSIS — R2689 Other abnormalities of gait and mobility: Secondary | ICD-10-CM | POA: Insufficient documentation

## 2021-05-19 DIAGNOSIS — M25551 Pain in right hip: Secondary | ICD-10-CM | POA: Diagnosis not present

## 2021-05-19 DIAGNOSIS — R29898 Other symptoms and signs involving the musculoskeletal system: Secondary | ICD-10-CM | POA: Insufficient documentation

## 2021-05-19 DIAGNOSIS — M545 Low back pain, unspecified: Secondary | ICD-10-CM | POA: Insufficient documentation
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
## 2021-05-19 NOTE — Patient Instructions (Signed)
Access Code: [email or web address] URL: [email or web address] Date: 05/19/2021 Prepared by: Mitzi Hansen Beronica [place]dale  Exercises Seated Cervical Retraction - 3 x daily - 7 x weekly - 2 sets - 10 reps Seated Isometric Hip Abduction with Belt - 3 x daily - 7 x weekly - 10 reps - 5 second hold

## 2021-05-20 ENCOUNTER — Encounter (HOSPITAL_COMMUNITY): Payer: Self-pay | Admitting: Physical Therapy

## 2021-05-20 ENCOUNTER — Ambulatory Visit (HOSPITAL_COMMUNITY): Payer: 59 | Admitting: Physical Therapy

## 2021-05-20 DIAGNOSIS — M542 Cervicalgia: Secondary | ICD-10-CM

## 2021-05-20 DIAGNOSIS — M545 Low back pain, unspecified: Secondary | ICD-10-CM

## 2021-05-20 DIAGNOSIS — L501 Idiopathic urticaria: Secondary | ICD-10-CM | POA: Diagnosis not present

## 2021-05-20 DIAGNOSIS — Z6838 Body mass index (BMI) 38.0-38.9, adult: Secondary | ICD-10-CM | POA: Diagnosis not present

## 2021-05-20 DIAGNOSIS — R2689 Other abnormalities of gait and mobility: Secondary | ICD-10-CM

## 2021-05-20 DIAGNOSIS — M25551 Pain in right hip: Secondary | ICD-10-CM

## 2021-05-20 DIAGNOSIS — E669 Obesity, unspecified: Secondary | ICD-10-CM | POA: Diagnosis not present

## 2021-05-20 DIAGNOSIS — M6281 Muscle weakness (generalized): Secondary | ICD-10-CM

## 2021-05-20 DIAGNOSIS — S134XXA Sprain of ligaments of cervical spine, initial encounter: Secondary | ICD-10-CM | POA: Diagnosis not present
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
## 2021-05-25 ENCOUNTER — Ambulatory Visit (HOSPITAL_COMMUNITY): Payer: 59

## 2021-05-25 ENCOUNTER — Encounter (HOSPITAL_COMMUNITY): Payer: Self-pay

## 2021-05-25 ENCOUNTER — Other Ambulatory Visit: Payer: Self-pay

## 2021-05-25 DIAGNOSIS — R29898 Other symptoms and signs involving the musculoskeletal system: Secondary | ICD-10-CM

## 2021-05-25 DIAGNOSIS — R2689 Other abnormalities of gait and mobility: Secondary | ICD-10-CM

## 2021-05-25 DIAGNOSIS — M542 Cervicalgia: Secondary | ICD-10-CM

## 2021-05-25 DIAGNOSIS — M25551 Pain in right hip: Secondary | ICD-10-CM | POA: Diagnosis not present

## 2021-05-25 DIAGNOSIS — M6281 Muscle weakness (generalized): Secondary | ICD-10-CM

## 2021-05-25 DIAGNOSIS — M545 Low back pain, unspecified: Secondary | ICD-10-CM
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
## 2021-05-27 ENCOUNTER — Other Ambulatory Visit: Payer: Self-pay

## 2021-05-27 ENCOUNTER — Ambulatory Visit (HOSPITAL_COMMUNITY): Payer: 59 | Admitting: Physical Therapy

## 2021-05-27 DIAGNOSIS — M25551 Pain in right hip: Secondary | ICD-10-CM

## 2021-05-27 DIAGNOSIS — M545 Low back pain, unspecified: Secondary | ICD-10-CM
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
## 2021-06-01 ENCOUNTER — Ambulatory Visit (HOSPITAL_COMMUNITY): Payer: 59

## 2021-06-01 ENCOUNTER — Encounter (HOSPITAL_COMMUNITY): Payer: Self-pay

## 2021-06-01 ENCOUNTER — Other Ambulatory Visit: Payer: Self-pay

## 2021-06-01 DIAGNOSIS — R2689 Other abnormalities of gait and mobility: Secondary | ICD-10-CM

## 2021-06-01 DIAGNOSIS — M25551 Pain in right hip: Secondary | ICD-10-CM

## 2021-06-01 DIAGNOSIS — R29898 Other symptoms and signs involving the musculoskeletal system: Secondary | ICD-10-CM

## 2021-06-01 DIAGNOSIS — M6281 Muscle weakness (generalized): Secondary | ICD-10-CM

## 2021-06-01 DIAGNOSIS — M545 Low back pain, unspecified: Secondary | ICD-10-CM

## 2021-06-01 DIAGNOSIS — M542 Cervicalgia: Secondary | ICD-10-CM
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
## 2021-06-03 ENCOUNTER — Encounter (HOSPITAL_COMMUNITY): Payer: Self-pay | Admitting: Physical Therapy

## 2021-06-03 ENCOUNTER — Other Ambulatory Visit: Payer: Self-pay

## 2021-06-03 ENCOUNTER — Ambulatory Visit (HOSPITAL_COMMUNITY): Payer: 59 | Admitting: Physical Therapy

## 2021-06-03 DIAGNOSIS — R2689 Other abnormalities of gait and mobility: Secondary | ICD-10-CM

## 2021-06-03 DIAGNOSIS — M545 Low back pain, unspecified: Secondary | ICD-10-CM

## 2021-06-03 DIAGNOSIS — M25551 Pain in right hip: Secondary | ICD-10-CM

## 2021-06-03 DIAGNOSIS — M6281 Muscle weakness (generalized): Secondary | ICD-10-CM

## 2021-06-03 DIAGNOSIS — R29898 Other symptoms and signs involving the musculoskeletal system: Secondary | ICD-10-CM

## 2021-06-03 DIAGNOSIS — M542 Cervicalgia: Secondary | ICD-10-CM
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
## 2021-06-03 NOTE — Patient Instructions (Signed)
Access Code: [email or web address] URL: [email or web address] Date: 06/03/2021 Prepared by: Mitzi Hansen Jadelin Eng  Exercises Standing Hip Abduction with Counter Support - 2 x daily - 7 x weekly - 2 sets - 10 reps Seated Isometric Hip Abduction with Belt - 2 x daily - 7 x weekly - 10 reps - 10 second hold Seated Isometric Hip Adduction with Ball - 2 x daily - 7 x weekly - 10 reps - 10 second hold

## 2021-06-07 ENCOUNTER — Telehealth: Payer: Self-pay | Admitting: Orthopedic Surgery

## 2021-06-07 NOTE — Telephone Encounter (Signed)
Patient left message 4:45pm; I returned call - patient requests to schedule the surgery for sometime after Sunday, 06/20/21. If patient needs appointment in office as pre-op, please advise. Patient states has coverage, [employer], under self, and [employer] \, under spouse - states no card yet.

## 2021-06-08 ENCOUNTER — Ambulatory Visit (HOSPITAL_COMMUNITY): Payer: 59

## 2021-06-08 ENCOUNTER — Other Ambulatory Visit: Payer: Self-pay

## 2021-06-08 ENCOUNTER — Encounter (HOSPITAL_COMMUNITY): Payer: Self-pay

## 2021-06-08 DIAGNOSIS — M25551 Pain in right hip: Secondary | ICD-10-CM

## 2021-06-08 DIAGNOSIS — M545 Low back pain, unspecified: Secondary | ICD-10-CM

## 2021-06-08 NOTE — Telephone Encounter (Signed)
Patient reached; scheduled; aware.

## 2021-06-08 NOTE — Telephone Encounter (Signed)
I called her she wants to do the surgery on March 17th will see you on March 2nd I need to put in orders  How do you want this posted and do you need any specials ?  Your note said repeat surgery with possible SCR??

## 2021-06-08 NOTE — Telephone Encounter (Signed)
Will you call her to make a preop appointment on March 2nd ?  I will call her to schedule surgery  We need copy of her [employer] card to make sure surgery approval is done correctly
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
## 2021-06-09 ENCOUNTER — Other Ambulatory Visit: Payer: Self-pay | Admitting: Orthopedic Surgery

## 2021-06-10 ENCOUNTER — Ambulatory Visit (HOSPITAL_COMMUNITY): Payer: 59 | Admitting: Physical Therapy

## 2021-06-10 ENCOUNTER — Other Ambulatory Visit: Payer: Self-pay | Admitting: Orthopedic Surgery

## 2021-06-10 ENCOUNTER — Other Ambulatory Visit: Payer: Self-pay

## 2021-06-10 ENCOUNTER — Encounter (HOSPITAL_COMMUNITY): Payer: Self-pay | Admitting: Physical Therapy

## 2021-06-10 DIAGNOSIS — M25551 Pain in right hip: Secondary | ICD-10-CM

## 2021-06-10 DIAGNOSIS — M75122 Complete rotator cuff tear or rupture of left shoulder, not specified as traumatic: Secondary | ICD-10-CM

## 2021-06-10 DIAGNOSIS — R2689 Other abnormalities of gait and mobility: Secondary | ICD-10-CM

## 2021-06-10 DIAGNOSIS — I10 Essential (primary) hypertension: Secondary | ICD-10-CM

## 2021-06-10 DIAGNOSIS — M545 Low back pain, unspecified: Secondary | ICD-10-CM

## 2021-06-10 DIAGNOSIS — M6281 Muscle weakness (generalized): Secondary | ICD-10-CM

## 2021-06-10 DIAGNOSIS — M542 Cervicalgia: Secondary | ICD-10-CM

## 2021-06-10 DIAGNOSIS — Z01818 Encounter for other preprocedural examination: Secondary | ICD-10-CM

## 2021-06-10 DIAGNOSIS — R29898 Other symptoms and signs involving the musculoskeletal system: Secondary | ICD-10-CM

## 2021-06-10 NOTE — Patient Instructions (Signed)
Access Code: [email or web address] URL: [email or web address] Date: 06/10/2021 Prepared by: Mitzi Hansen Leahna Hewson  Exercises Standing Marching - 1 x daily - 7 x weekly - 2 sets - 10 reps Standing Hip Abduction with Counter Support - 1 x daily - 7 x weekly - 2 sets - 10 reps
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
## 2021-06-10 NOTE — Telephone Encounter (Signed)
Will you help me find a CPT code for the [facility] ??

## 2021-06-15 ENCOUNTER — Ambulatory Visit: Payer: PRIVATE HEALTH INSURANCE | Admitting: Cardiology

## 2021-06-15 ENCOUNTER — Other Ambulatory Visit: Payer: Self-pay

## 2021-06-15 ENCOUNTER — Encounter (HOSPITAL_COMMUNITY): Payer: Self-pay | Admitting: Physical Therapy

## 2021-06-15 ENCOUNTER — Ambulatory Visit (HOSPITAL_COMMUNITY): Payer: 59 | Admitting: Physical Therapy

## 2021-06-15 DIAGNOSIS — M25551 Pain in right hip: Secondary | ICD-10-CM | POA: Diagnosis not present

## 2021-06-15 DIAGNOSIS — M6281 Muscle weakness (generalized): Secondary | ICD-10-CM

## 2021-06-15 DIAGNOSIS — R29898 Other symptoms and signs involving the musculoskeletal system: Secondary | ICD-10-CM

## 2021-06-15 DIAGNOSIS — M545 Low back pain, unspecified: Secondary | ICD-10-CM

## 2021-06-15 DIAGNOSIS — R2689 Other abnormalities of gait and mobility: Secondary | ICD-10-CM

## 2021-06-15 DIAGNOSIS — M542 Cervicalgia: Secondary | ICD-10-CM
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
## 2021-06-16 ENCOUNTER — Other Ambulatory Visit: Payer: Self-pay

## 2021-06-16 DIAGNOSIS — M75122 Complete rotator cuff tear or rupture of left shoulder, not specified as traumatic: Secondary | ICD-10-CM

## 2021-06-17 ENCOUNTER — Encounter (HOSPITAL_COMMUNITY): Payer: Self-pay | Admitting: Physical Therapy

## 2021-06-17 ENCOUNTER — Other Ambulatory Visit: Payer: Self-pay

## 2021-06-17 ENCOUNTER — Ambulatory Visit (HOSPITAL_COMMUNITY): Payer: 59 | Attending: Family Medicine | Admitting: Physical Therapy

## 2021-06-17 ENCOUNTER — Ambulatory Visit: Payer: 59 | Admitting: Orthopedic Surgery

## 2021-06-17 DIAGNOSIS — R29898 Other symptoms and signs involving the musculoskeletal system: Secondary | ICD-10-CM | POA: Diagnosis not present

## 2021-06-17 DIAGNOSIS — R2689 Other abnormalities of gait and mobility: Secondary | ICD-10-CM | POA: Diagnosis not present

## 2021-06-17 DIAGNOSIS — M542 Cervicalgia: Secondary | ICD-10-CM | POA: Insufficient documentation

## 2021-06-17 DIAGNOSIS — M6281 Muscle weakness (generalized): Secondary | ICD-10-CM | POA: Insufficient documentation

## 2021-06-17 DIAGNOSIS — M25551 Pain in right hip: Secondary | ICD-10-CM | POA: Diagnosis not present

## 2021-06-17 DIAGNOSIS — M545 Low back pain, unspecified: Secondary | ICD-10-CM | POA: Insufficient documentation
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
## 2021-06-17 NOTE — Patient Instructions (Signed)
Access Code: [email or web address] ?URL: [email or web address] ?Date: 06/17/2021 ?Prepared by: Mitzi Hansen Kalyn Dimattia ? ?Exercises ?Seated Long Arc Quad - 3 x daily - 7 x weekly - 1 sets - 10 reps - 5-10 second hold ? ?

## 2021-06-18 ENCOUNTER — Other Ambulatory Visit: Payer: Self-pay | Admitting: *Deleted

## 2021-06-18 ENCOUNTER — Other Ambulatory Visit (HOSPITAL_COMMUNITY): Payer: Self-pay

## 2021-06-18 MED ORDER — HYDROCODONE-ACETAMINOPHEN 10-325 MG PO TABS: 1.0000 | ORAL_TABLET | Freq: Two times a day (BID) | ORAL | 0 refills | Status: DC | PRN

## 2021-06-18 MED ORDER — OXYCODONE HCL 5 MG PO TABS: 5.0000 mg | ORAL_TABLET | Freq: Every day | ORAL | 0 refills | Status: DC | PRN

## 2021-06-22 ENCOUNTER — Encounter (HOSPITAL_COMMUNITY): Payer: Self-pay | Admitting: Physical Therapy

## 2021-06-22 ENCOUNTER — Ambulatory Visit (HOSPITAL_COMMUNITY): Payer: 59 | Admitting: Physical Therapy

## 2021-06-22 ENCOUNTER — Other Ambulatory Visit: Payer: Self-pay

## 2021-06-22 DIAGNOSIS — M545 Low back pain, unspecified: Secondary | ICD-10-CM

## 2021-06-22 DIAGNOSIS — M25551 Pain in right hip: Secondary | ICD-10-CM

## 2021-06-22 DIAGNOSIS — R2689 Other abnormalities of gait and mobility: Secondary | ICD-10-CM

## 2021-06-22 DIAGNOSIS — M542 Cervicalgia: Secondary | ICD-10-CM

## 2021-06-22 DIAGNOSIS — M6281 Muscle weakness (generalized): Secondary | ICD-10-CM

## 2021-06-22 DIAGNOSIS — R29898 Other symptoms and signs involving the musculoskeletal system: Secondary | ICD-10-CM
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
## 2021-06-24 ENCOUNTER — Ambulatory Visit (HOSPITAL_COMMUNITY): Payer: 59 | Admitting: Physical Therapy

## 2021-06-24 ENCOUNTER — Other Ambulatory Visit: Payer: Self-pay

## 2021-06-24 ENCOUNTER — Encounter (HOSPITAL_COMMUNITY): Payer: Self-pay | Admitting: Physical Therapy

## 2021-06-24 DIAGNOSIS — M6281 Muscle weakness (generalized): Secondary | ICD-10-CM

## 2021-06-24 DIAGNOSIS — M542 Cervicalgia: Secondary | ICD-10-CM

## 2021-06-24 DIAGNOSIS — M25551 Pain in right hip: Secondary | ICD-10-CM | POA: Diagnosis not present

## 2021-06-24 DIAGNOSIS — M545 Low back pain, unspecified: Secondary | ICD-10-CM

## 2021-06-24 DIAGNOSIS — R2689 Other abnormalities of gait and mobility: Secondary | ICD-10-CM

## 2021-06-24 DIAGNOSIS — R29898 Other symptoms and signs involving the musculoskeletal system: Secondary | ICD-10-CM
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
## 2021-06-28 NOTE — Patient Instructions (Signed)
Hannah Cooper  06/28/2021     '@[email or web address]'$ @   Your procedure is scheduled on 07/02/2021.   [facility] to [facility]   Call this number if you have problems the morning of surgery:  [phone]   Remember:  Do not eat or drink after midnight.  Take 7.5 units of your night time insulin the night before your procedure.  DO NOT take any medications for diabetes the morning of your procedure.    Use your nebulizer and your inhaler before you come and bring your rescue inhaler with you.    Take these medicines the morning of surgery with A SIP OF WATER   zyrtec, diltiazem, hydrocodone or oxycodone (if needed), imdur, metoprolol.    Do not wear jewelry, make-up or nail polish.  Do not wear lotions, powders, or perfumes, or deodorant.  Do not shave 48 hours prior to surgery.  Men may shave face and neck.  Do not bring valuables to the hospital.  [facility] is not responsible for any belongings or valuables.  Contacts, dentures or bridgework may not be worn into surgery.  Leave your suitcase in the car.  After surgery it may be brought to your room.  For patients admitted to the hospital, discharge time will be determined by your treatment team.  Patients discharged the day of surgery will not be allowed to drive home and must have someone with them for 24 horus.    Special instructions:   DO NOT smoke tobacco or vape for 24 hours before your procedure.  Please read over the following fact sheets that you were given. Coughing and Deep Breathing, Surgical Site Infection Prevention, Anesthesia Post-op Instructions, and Care and Recovery After Surgery        Surgery for Rotator Cuff Tear, Care After This sheet gives you information about how to care for yourself after your procedure. Your health care provider may also give you more specific instructions. If you have problems or questions, contact your health care provider. What can I expect  after the procedure? After the procedure, it is common to have: Redness. Swelling. A small amount of fluid or blood in the incision area. Pain. Stiffness. Follow these instructions at home: If you have a sling or a shoulder immobilizer: Wear it as told by your health care provider. Remove it only as told by your health care provider. Loosen it if your fingers tingle, become numb, or turn cold and blue. Keep it clean and dry. Bathing Do not take baths, swim, or use a hot tub until your health care provider approves. Ask your health care provider if you may take showers. You may only be allowed to take sponge baths. Keep your bandage (dressing) dry until your health care provider says it can be removed. If your sling or shoulder immobilizer is not waterproof: Do not let it get wet. Remove it when you take a bath or shower as told by your health care provider. Once the sling or shoulder immobilizer is removed, try not to move your shoulder until your health care provider says that you can. Incision care  Follow instructions from your health care provider about how to take care of your incision. Make sure you: Wash your hands with soap and water for at least 20 seconds before and after you change your dressing. If soap and water are not available, use hand sanitizer. Change your dressing as told by your health care  provider. Leave stitches (sutures), skin glue, or adhesive strips in place. These skin closures may need to stay in place for 2 weeks or longer. If adhesive strip edges start to loosen and curl up, you may trim the loose edges. Do not remove adhesive strips completely unless your health care provider tells you to do that. Check your incision area every day for signs of infection. Check for: More redness, swelling, or pain. More fluid or blood. Warmth. Pus or a bad smell. Managing pain, stiffness, and swelling  If directed, put ice on your shoulder area. To do this: Put ice in a  plastic bag. Place a towel between your skin and the bag. Leave the ice on for 20 minutes, 2-3 times a day. Remove the ice if your skin turns bright red. This is very important. If you cannot feel pain, heat, or cold, you have a greater risk of damage to the area. Move your fingers often to reduce stiffness and swelling. Raise (elevate) your upper body on pillows when you lie down and when you sleep. Do not sleep on the front of your body (abdomen). Do not sleep on the side that your surgery was performed on. Medicines Take over-the-counter and prescription medicines only as told by your health care provider. Ask your health care provider if the medicine prescribed to you: Requires you to avoid driving or using machinery. Can cause constipation. You may need to take these actions to prevent or treat constipation: Drink enough fluid to keep your urine pale yellow. Take over-the-counter or prescription medicines. Eat foods that are high in fiber, such as beans, whole grains, and fresh fruits and vegetables. Limit foods that are high in fat and processed sugars, such as fried or sweet foods. Driving If you were given a sedative during the procedure, it can affect you for several hours. Do not drive or operate machinery until your health care provider says that it is safe. Do not drive while wearing a sling or a shoulder immobilizer. Ask your health care provider when it is safe to drive. Activity Do not use your arm to support your body weight until your health care provider says that you can. Do not lift or hold anything with your arm until your health care provider approves. Return to your normal activities as told by your health care provider. Ask your health care provider what activities are safe for you. Do exercises as told by your health care provider. General instructions Do not use any products that contain nicotine or tobacco, such as cigarettes, e-cigarettes, and chewing tobacco.  These can delay healing after surgery. If you need help quitting, ask your health care provider. Keep all follow-up visits. This is important. Contact a health care provider if: You have a fever or chills. You have more redness, swelling, or pain around your incision. You have more fluid or blood coming from your incision. Your incision feels warm to the touch. You have pus or a bad smell coming from your incision. You have pain that gets worse or does not get better with medicine. Get help right away if: You have severe pain. You lose feeling in your arm or hand. Your hand or fingers turn very pale or blue. Summary If you have a sling or shoulder immobilizer, wear it as told by your health care provider. Remove it only as told by your health care provider. Change your dressing as told by your health care provider. Check the incision area every day for signs  of infection. If directed, put ice on your shoulder area 2-3 times a day. Do not use your arm to lift anything or to support your body weight until your health care provider says that you can. This information is not intended to replace advice given to you by your health care provider. Make sure you discuss any questions you have with your health care provider. Document Revised: 08/07/2019 Document Reviewed: 08/07/2019 Elsevier Patient Education  2022 [place] Anesthesia, Adult, Care After This sheet gives you information about how to care for yourself after your procedure. Your health care provider may also give you more specific instructions. If you have problems or questions, contact your health care provider. What can I expect after the procedure? After the procedure, the following side effects are common: Pain or discomfort at the IV site. Nausea. Vomiting. Sore throat. Trouble concentrating. Feeling cold or chills. Feeling weak or tired. Sleepiness and fatigue. Soreness and body aches. These side effects can  affect parts of the body that were not involved in surgery. Follow these instructions at home: For the time period you were told by your health care provider:  Rest. Do not participate in activities where you could fall or become injured. Do not drive or use machinery. Do not drink alcohol. Do not take sleeping pills or medicines that cause drowsiness. Do not make important decisions or sign legal documents. Do not take care of children on your own. Eating and drinking Follow any instructions from your health care provider about eating or drinking restrictions. When you feel hungry, start by eating small amounts of foods that are soft and easy to digest (bland), such as toast. Gradually return to your regular diet. Drink enough fluid to keep your urine pale yellow. If you vomit, rehydrate by drinking water, juice, or clear broth. General instructions If you have sleep apnea, surgery and certain medicines can increase your risk for breathing problems. Follow instructions from your health care provider about wearing your sleep device: Anytime you are sleeping, including during daytime naps. While taking prescription pain medicines, sleeping medicines, or medicines that make you drowsy. Have a responsible adult stay with you for the time you are told. It is important to have someone help care for you until you are awake and alert. Return to your normal activities as told by your health care provider. Ask your health care provider what activities are safe for you. Take over-the-counter and prescription medicines only as told by your health care provider. If you smoke, do not smoke without supervision. Keep all follow-up visits as told by your health care provider. This is important. Contact a health care provider if: You have nausea or vomiting that does not get better with medicine. You cannot eat or drink without vomiting. You have pain that does not get better with medicine. You are  unable to pass urine. You develop a skin rash. You have a fever. You have redness around your IV site that gets worse. Get help right away if: You have difficulty breathing. You have chest pain. You have blood in your urine or stool, or you vomit blood. Summary After the procedure, it is common to have a sore throat or nausea. It is also common to feel tired. Have a responsible adult stay with you for the time you are told. It is important to have someone help care for you until you are awake and alert. When you feel hungry, start by eating small amounts of foods that are  soft and easy to digest (bland), such as toast. Gradually return to your regular diet. Drink enough fluid to keep your urine pale yellow. Return to your normal activities as told by your health care provider. Ask your health care provider what activities are safe for you. This information is not intended to replace advice given to you by your health care provider. Make sure you discuss any questions you have with your health care provider. Document Revised: 12/19/2019 Document Reviewed: 07/18/2019 Elsevier Patient Education  2022 [place]. How to Use Chlorhexidine for Bathing Chlorhexidine gluconate (CHG) is a germ-killing (antiseptic) solution that is used to clean the skin. It can get rid of the bacteria that normally live on the skin and can keep them away for about 24 hours. To clean your skin with CHG, you may be given: A CHG solution to use in the shower or as part of a sponge bath. A prepackaged cloth that contains CHG. Cleaning your skin with CHG may help lower the risk for infection: While you are staying in the intensive care unit of the hospital. If you have a vascular access, such as a central line, to provide short-term or long-term access to your veins. If you have a catheter to drain urine from your bladder. If you are on a ventilator. A ventilator is a machine that helps you breathe by moving air in and  out of your lungs. After surgery. What are the risks? Risks of using CHG include: A skin reaction. Hearing loss, if CHG gets in your ears and you have a perforated eardrum. Eye injury, if CHG gets in your eyes and is not rinsed out. The CHG product catching fire. Make sure that you avoid smoking and flames after applying CHG to your skin. Do not use CHG: If you have a chlorhexidine allergy or have previously reacted to chlorhexidine. On babies younger than 55 months of age. How to use CHG solution Use CHG only as told by your health care provider, and follow the instructions on the label. Use the full amount of CHG as directed. Usually, this is one bottle. During a shower Follow these steps when using CHG solution during a shower (unless your health care provider gives you different instructions): Start the shower. Use your normal soap and shampoo to wash your face and hair. Turn off the shower or move out of the shower stream. Pour the CHG onto a clean washcloth. Do not use any type of brush or rough-edged sponge. Starting at your neck, lather your body down to your toes. Make sure you follow these instructions: If you will be having surgery, pay special attention to the part of your body where you will be having surgery. Scrub this area for at least 1 minute. Do not use CHG on your head or face. If the solution gets into your ears or eyes, rinse them well with water. Avoid your genital area. Avoid any areas of skin that have broken skin, cuts, or scrapes. Scrub your back and under your arms. Make sure to wash skin folds. Let the lather sit on your skin for 1-2 minutes or as long as told by your health care provider. Thoroughly rinse your entire body in the shower. Make sure that all body creases and crevices are rinsed well. Dry off with a clean towel. Do not put any substances on your body afterward--such as powder, lotion, or perfume--unless you are told to do so by your health care  provider. Only use lotions that  are recommended by the manufacturer. Put on clean clothes or pajamas. If it is the night before your surgery, sleep in clean sheets.  During a sponge bath Follow these steps when using CHG solution during a sponge bath (unless your health care provider gives you different instructions): Use your normal soap and shampoo to wash your face and hair. Pour the CHG onto a clean washcloth. Starting at your neck, lather your body down to your toes. Make sure you follow these instructions: If you will be having surgery, pay special attention to the part of your body where you will be having surgery. Scrub this area for at least 1 minute. Do not use CHG on your head or face. If the solution gets into your ears or eyes, rinse them well with water. Avoid your genital area. Avoid any areas of skin that have broken skin, cuts, or scrapes. Scrub your back and under your arms. Make sure to wash skin folds. Let the lather sit on your skin for 1-2 minutes or as long as told by your health care provider. Using a different clean, wet washcloth, thoroughly rinse your entire body. Make sure that all body creases and crevices are rinsed well. Dry off with a clean towel. Do not put any substances on your body afterward--such as powder, lotion, or perfume--unless you are told to do so by your health care provider. Only use lotions that are recommended by the manufacturer. Put on clean clothes or pajamas. If it is the night before your surgery, sleep in clean sheets. How to use CHG prepackaged cloths Only use CHG cloths as told by your health care provider, and follow the instructions on the label. Use the CHG cloth on clean, dry skin. Do not use the CHG cloth on your head or face unless your health care provider tells you to. When washing with the CHG cloth: Avoid your genital area. Avoid any areas of skin that have broken skin, cuts, or scrapes. Before surgery Follow these steps  when using a CHG cloth to clean before surgery (unless your health care provider gives you different instructions): Using the CHG cloth, vigorously scrub the part of your body where you will be having surgery. Scrub using a back-and-forth motion for 3 minutes. The area on your body should be completely wet with CHG when you are done scrubbing. Do not rinse. Discard the cloth and let the area air-dry. Do not put any substances on the area afterward, such as powder, lotion, or perfume. Put on clean clothes or pajamas. If it is the night before your surgery, sleep in clean sheets.  For general bathing Follow these steps when using CHG cloths for general bathing (unless your health care provider gives you different instructions). Use a separate CHG cloth for each area of your body. Make sure you wash between any folds of skin and between your fingers and toes. Wash your body in the following order, switching to a new cloth after each step: The front of your neck, shoulders, and chest. Both of your arms, under your arms, and your hands. Your stomach and groin area, avoiding the genitals. Your right leg and foot. Your left leg and foot. The back of your neck, your back, and your buttocks. Do not rinse. Discard the cloth and let the area air-dry. Do not put any substances on your body afterward--such as powder, lotion, or perfume--unless you are told to do so by your health care provider. Only use lotions that are recommended by  the manufacturer. Put on clean clothes or pajamas. Contact a health care provider if: Your skin gets irritated after scrubbing. You have questions about using your solution or cloth. You swallow any chlorhexidine. Call your local poison control center (1-843-576-1725 in the [place] Get help right away if: Your eyes itch badly, or they become very red or swollen. Your skin itches badly and is red or swollen. Your hearing changes. You have trouble seeing. You have swelling or  tingling in your mouth or throat. You have trouble breathing. These symptoms may represent a serious problem that is an emergency. Do not wait to see if the symptoms will go away. Get medical help right away. Call your local emergency services (911 in the [place] Do not drive yourself to the hospital. Summary Chlorhexidine gluconate (CHG) is a germ-killing (antiseptic) solution that is used to clean the skin. Cleaning your skin with CHG may help to lower your risk for infection. You may be given CHG to use for bathing. It may be in a bottle or in a prepackaged cloth to use on your skin. Carefully follow your health care provider's instructions and the instructions on the product label. Do not use CHG if you have a chlorhexidine allergy. Contact your health care provider if your skin gets irritated after scrubbing. This information is not intended to replace advice given to you by your health care provider. Make sure you discuss any questions you have with your health care provider. Document Revised: 06/15/2020 Document Reviewed: 06/15/2020 Elsevier Patient Education  2022 [employer].

## 2021-06-29 ENCOUNTER — Encounter (HOSPITAL_COMMUNITY): Payer: Self-pay | Admitting: Physical Therapy

## 2021-06-29 ENCOUNTER — Encounter (HOSPITAL_COMMUNITY)
Admission: RE | Admit: 2021-06-29 | Discharge: 2021-06-29 | Disposition: A | Discharge status: Home / Self Care | Payer: 59 | Source: Ambulatory Visit | Attending: Orthopedic Surgery | Admitting: Orthopedic Surgery

## 2021-06-29 ENCOUNTER — Ambulatory Visit (HOSPITAL_COMMUNITY): Payer: 59 | Admitting: Physical Therapy

## 2021-06-29 ENCOUNTER — Encounter (HOSPITAL_COMMUNITY): Payer: Self-pay

## 2021-06-29 ENCOUNTER — Other Ambulatory Visit: Payer: Self-pay

## 2021-06-29 VITALS — BP 115/74 | HR 69 | Temp 98.6°F | Resp 18 | Ht 65.0 in | Wt 223.0 lb

## 2021-06-29 DIAGNOSIS — R2689 Other abnormalities of gait and mobility: Secondary | ICD-10-CM

## 2021-06-29 DIAGNOSIS — M6281 Muscle weakness (generalized): Secondary | ICD-10-CM

## 2021-06-29 DIAGNOSIS — M545 Low back pain, unspecified: Secondary | ICD-10-CM

## 2021-06-29 DIAGNOSIS — R29898 Other symptoms and signs involving the musculoskeletal system: Secondary | ICD-10-CM

## 2021-06-29 DIAGNOSIS — M25551 Pain in right hip: Secondary | ICD-10-CM | POA: Diagnosis not present

## 2021-06-29 DIAGNOSIS — I10 Essential (primary) hypertension: Secondary | ICD-10-CM | POA: Insufficient documentation

## 2021-06-29 DIAGNOSIS — Z01818 Encounter for other preprocedural examination: Secondary | ICD-10-CM

## 2021-06-29 DIAGNOSIS — Z01812 Encounter for preprocedural laboratory examination: Secondary | ICD-10-CM | POA: Diagnosis not present

## 2021-06-29 DIAGNOSIS — M75122 Complete rotator cuff tear or rupture of left shoulder, not specified as traumatic: Secondary | ICD-10-CM | POA: Diagnosis not present

## 2021-06-29 DIAGNOSIS — M542 Cervicalgia: Secondary | ICD-10-CM

## 2021-06-29 DIAGNOSIS — E119 Type 2 diabetes mellitus without complications: Secondary | ICD-10-CM | POA: Diagnosis not present

## 2021-06-29 HISTORY — DX: Type 2 diabetes mellitus without complications: E11.9

## 2021-06-29 LAB — BASIC METABOLIC PANEL
Anion gap: 9 (ref 5–15)
BUN: 14 mg/dL (ref 8–23)
CO2: 30 mmol/L (ref 22–32)
Calcium: 9.6 mg/dL (ref 8.9–10.3)
Chloride: 100 mmol/L (ref 98–111)
Creatinine, Ser: 0.87 mg/dL (ref 0.44–1.00)
GFR, Estimated: >60 mL/min (ref >60)
Glucose, Bld: 113 mg/dL — ABNORMAL HIGH (ref 70–99)
Potassium: 3.6 mmol/L (ref 3.5–5.1)
Sodium: 139 mmol/L (ref 135–145)

## 2021-06-29 LAB — CBC WITH DIFFERENTIAL/PLATELET
Abs Immature Granulocytes: 0.01 10*3/uL (ref 0.00–0.07)
Basophils Absolute: 0 10*3/uL (ref 0.0–0.1)
Basophils Relative: 0 %
Eosinophils Absolute: 0.1 10*3/uL (ref 0.0–0.5)
Eosinophils Relative: 5 %
HCT: 35.6 % — ABNORMAL LOW (ref 36.0–46.0)
Hemoglobin: 11.9 g/dL — ABNORMAL LOW (ref 12.0–15.0)
Immature Granulocytes: 0 %
Lymphocytes Relative: 28 %
Lymphs Abs: 0.9 10*3/uL (ref 0.7–4.0)
MCH: 30.4 pg (ref 26.0–34.0)
MCHC: 33.4 g/dL (ref 30.0–36.0)
MCV: 91 fL (ref 80.0–100.0)
Monocytes Absolute: 0.4 10*3/uL (ref 0.1–1.0)
Monocytes Relative: 11 %
Neutro Abs: 1.7 10*3/uL (ref 1.7–7.7)
Neutrophils Relative %: 56 %
Platelets: 319 10*3/uL (ref 150–400)
RBC: 3.91 MIL/uL (ref 3.87–5.11)
RDW: 14.3 % (ref 11.5–15.5)
WBC: 3.1 10*3/uL — ABNORMAL LOW (ref 4.0–10.5)
nRBC: 0 % (ref 0.0–0.2)
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
## 2021-06-29 NOTE — Patient Instructions (Signed)
Access Code: [email or web address] ?URL: [email or web address] ?Date: 06/29/2021 ?Prepared by: Mitzi Hansen Ruthel Martine ? ?Exercises ?Step Up (Mirrored) - 2-3 x daily - 7 x weekly - 2-3 sets - 10 reps ? ?

## 2021-06-30 LAB — HEMOGLOBIN A1C
Hgb A1c MFr Bld: 6.6 % — ABNORMAL HIGH (ref 4.8–5.6)
Mean Plasma Glucose: 143 mg/dL

## 2021-07-01 ENCOUNTER — Telehealth: Payer: Self-pay | Admitting: Orthopedic Surgery

## 2021-07-01 ENCOUNTER — Encounter (HOSPITAL_COMMUNITY): Payer: 59 | Admitting: Physical Therapy

## 2021-07-01 NOTE — Telephone Encounter (Signed)
See below.  Thank you

## 2021-07-01 NOTE — Telephone Encounter (Signed)
Spoke with patient. Advised her that anti anxiety is not recommended per provider. Verbalized understanding.  ?

## 2021-07-01 NOTE — H&P (Signed)
Outpatient history and physical  ? ?Hpi ? ?66 year old female 63 -month status post rotator cuff repair distal clavicle excision presents with pain left shoulder and weakness ? ?Patient says she was doing very well until she reached for her grandchild who was falling off the bed and since that time she has had increased pain decreased range of motion and weakness in the left shoulder.  She has tried to do some of her exercises on occasion ? ?She initially opted for non surgical treatment but later changed her mind and requested operative intervention  ? ?Review of Systems  ?Constitutional:  Negative for chills and fever.  ?HENT:  Negative for hearing loss.   ?Eyes:  Negative for discharge and redness.  ?Respiratory:  Negative for cough and shortness of breath.   ?Cardiovascular:  Negative for chest pain.  ?Gastrointestinal:  Negative for constipation and diarrhea.  ?Genitourinary:  Negative for frequency and urgency.  ?Musculoskeletal:  Positive for joint pain.  ?Skin:  Negative for rash.  ?Neurological:  Negative for tingling.  ?Endo/Heme/Allergies:  Negative for environmental allergies and polydipsia. Does not bruise/bleed easily.  ?Psychiatric/Behavioral:  Negative for depression.   ? ? ?Past Medical History:  ?Diagnosis Date  ? Arthritis   ? Atypical chest pain   ? chronic  ? Cancer [facility])   ? bone cancer  ? Chronic back pain   ? Chronic diastolic CHF (congestive heart failure) ([place])   ? COPD (chronic obstructive pulmonary disease) ([place])   ? DDD (degenerative disc disease), cervical   ? Diabetes mellitus without complication ([place])   ? History of radiation therapy 07/16/19-08/22/19  ? L4 spine ; Dr. Gery Pray  ? Hyperlipidemia   ? Hypertension   ? Lumbar radiculopathy   ? Meningitis   ? Normal coronary arteries 2014  ? Palpitations   ? Premature atrial contractions   ? PVC's (premature ventricular contractions)   ? ?Past Surgical History:  ?Procedure Laterality Date  ? ABDOMINAL HYSTERECTOMY    ? BREAST  LUMPECTOMY WITH RADIOFREQUENCY TAG IDENTIFICATION Right 01/25/2021  ? Procedure: BREAST LUMPECTOMY WITH RADIOFREQUENCY TAG IDENTIFICATION;  Surgeon: Virl Cagey, MD;  Location: [facility];  Service: General;  Laterality: Right;  ? EXCISION OF BREAST BIOPSY Right 01/25/2021  ? Procedure: EXCISION OF BREAST BIOPSY;  Surgeon: Virl Cagey, MD;  Location: [facility];  Service: General;  Laterality: Right;  ? IR FLUORO GUIDED NEEDLE PLC ASPIRATION/INJECTION LOC  06/19/2019  ? LEFT HEART CATHETERIZATION WITH CORONARY ANGIOGRAM N/A 09/05/2012  ? Procedure: LEFT HEART CATHETERIZATION WITH CORONARY ANGIOGRAM;  Surgeon: Wellington Hampshire, MD;  Location: [place] CATH LAB;  Service: Cardiovascular;  Laterality: N/A;  ? RESECTION DISTAL CLAVICAL Left 07/03/2020  ? Procedure: RESECTION DISTAL CLAVICAL;  Surgeon: Carole Civil, MD;  Location: [facility];  Service: Orthopedics;  Laterality: Left;  ? SHOULDER OPEN ROTATOR CUFF REPAIR Left 07/03/2020  ? Procedure: ROTATOR CUFF REPAIR SHOULDER OPEN WITH CHROMEOPLASTY;  Surgeon: Carole Civil, MD;  Location: [facility];  Service: Orthopedics;  Laterality: Left;  ? THYROID SURGERY  2002  ? ?Family History  ?Problem Relation Age of Onset  ? Heart attack Mother 45  ? Heart attack Sister 21  ? Breast cancer Maternal Grandmother   ? Breast cancer Cousin   ? ? ?Social History  ? ?Tobacco Use  ? Smoking status: Never  ? Smokeless tobacco: Never  ?Vaping Use  ? Vaping Use: Never used  ?Substance Use Topics  ? Alcohol use: No  ?  Alcohol/week: 0.0  standard drinks  ? Drug use: No  ? ? ?Allergies  ?Allergen Reactions  ? Other Swelling  ?  [place]on lipstick  ? ? ? ?Gen: normal appearance but bmi 37 ? ?Cdv normal pulse color and temperature  ? ?Neuro normal sensation/normal reflexes ? ?Psych normal alert and awake  ? ?Exam of the left shoulder  ? ?Skin incx are normal  ? ?shows tenderness posteriorly laterally and anteriorly s ? ?he has decreased active range of motion  ? ?she has abduction  weakness ? ?But normal stability ? ?Images: ? ?MRI at [facility] ? ?That MRI shows that she has tearing of the supraspinatus with retraction to the humeral head it is a full-thickness tear approximately 8 mm front to back there is long head of biceps tendon tearing as well  ? ?The procedure has been fully reviewed with the patient; The risks and benefits of surgery have been discussed and explained and understood. Alternative treatment has also been reviewed, questions were encouraged and answered. The postoperative plan is also been reviewed. ? ? ?Re-tear of left rotator cuff repair left shoulder  ? ? ?Open rotator cuff repair left shoulder with SCR  ? ?Note  ?07/03/2020 ? ?10:56 AM ? ?PATIENT:  Hannah Cooper  66 y.o. female ? ?PRE-OPERATIVE DIAGNOSIS:  left rotator cuff tear ? ?POST-OPERATIVE DIAGNOSIS:  left rotator cuff tear ? ?PROCEDURE:  Procedure(s): ?OPEN ROTATOR CUFF REPAIR LEFT SHOULDER  ?OPEN WITH ACROMIOPLASTY AND  ?RESECTION DISTAL CLAVICAL  ? ?FINDINGS: Torn rotator cuff supraspinatus tendon full-thickness tear.  Delamination was noted.  1-1/2 cm front to back.  It was a U-shaped tear.  Hypertrophy of the acromion with grade 4 degenerative arthritis of the distal clavicle and inferior osteophyte. ? ?Implants: Arthrex speed bridge ? ? ?

## 2021-07-01 NOTE — Telephone Encounter (Signed)
Patient is calling requesting something to calm her down before her surgery tomorrow.  ? ?Patient request it be sent to [facility]  ? ?Please call her back and advise ?

## 2021-07-02 ENCOUNTER — Encounter (HOSPITAL_COMMUNITY)
Admission: RE | Disposition: A | Discharge status: Home / Self Care | Payer: Self-pay | Source: Ambulatory Visit | Attending: Orthopedic Surgery

## 2021-07-02 ENCOUNTER — Encounter (HOSPITAL_COMMUNITY): Payer: Self-pay | Admitting: Orthopedic Surgery

## 2021-07-02 ENCOUNTER — Other Ambulatory Visit: Payer: Self-pay

## 2021-07-02 ENCOUNTER — Ambulatory Visit (HOSPITAL_BASED_OUTPATIENT_CLINIC_OR_DEPARTMENT_OTHER): Payer: 59 | Admitting: Certified Registered Nurse Anesthetist

## 2021-07-02 ENCOUNTER — Ambulatory Visit (HOSPITAL_COMMUNITY)
Admission: RE | Admit: 2021-07-02 | Discharge: 2021-07-02 | Disposition: A | Discharge status: Home / Self Care | Payer: 59 | Source: Ambulatory Visit | Attending: Orthopedic Surgery | Admitting: Orthopedic Surgery

## 2021-07-02 ENCOUNTER — Ambulatory Visit (HOSPITAL_COMMUNITY): Payer: 59 | Admitting: Certified Registered Nurse Anesthetist

## 2021-07-02 DIAGNOSIS — I509 Heart failure, unspecified: Secondary | ICD-10-CM | POA: Diagnosis not present

## 2021-07-02 DIAGNOSIS — M75112 Incomplete rotator cuff tear or rupture of left shoulder, not specified as traumatic: Secondary | ICD-10-CM

## 2021-07-02 DIAGNOSIS — M5416 Radiculopathy, lumbar region: Secondary | ICD-10-CM | POA: Diagnosis not present

## 2021-07-02 DIAGNOSIS — I5032 Chronic diastolic (congestive) heart failure: Secondary | ICD-10-CM | POA: Diagnosis not present

## 2021-07-02 DIAGNOSIS — J449 Chronic obstructive pulmonary disease, unspecified: Secondary | ICD-10-CM | POA: Diagnosis not present

## 2021-07-02 DIAGNOSIS — S46012A Strain of muscle(s) and tendon(s) of the rotator cuff of left shoulder, initial encounter: Secondary | ICD-10-CM

## 2021-07-02 DIAGNOSIS — S46012D Strain of muscle(s) and tendon(s) of the rotator cuff of left shoulder, subsequent encounter: Secondary | ICD-10-CM

## 2021-07-02 DIAGNOSIS — I110 Hypertensive heart disease with heart failure: Secondary | ICD-10-CM | POA: Diagnosis not present

## 2021-07-02 DIAGNOSIS — E119 Type 2 diabetes mellitus without complications: Secondary | ICD-10-CM | POA: Insufficient documentation

## 2021-07-02 DIAGNOSIS — G8918 Other acute postprocedural pain: Secondary | ICD-10-CM | POA: Diagnosis not present

## 2021-07-02 DIAGNOSIS — G709 Myoneural disorder, unspecified: Secondary | ICD-10-CM | POA: Insufficient documentation

## 2021-07-02 HISTORY — PX: SHOULDER OPEN ROTATOR CUFF REPAIR: SHX2407

## 2021-07-02 LAB — GLUCOSE, CAPILLARY
Glucose-Capillary: 107 mg/dL — ABNORMAL HIGH (ref 70–99)
Glucose-Capillary: 111 mg/dL — ABNORMAL HIGH (ref 70–99)

## 2021-07-02 SURGERY — REPAIR, ROTATOR CUFF, OPEN
Anesthesia: General | Site: Shoulder | Laterality: Left

## 2021-07-02 MED ORDER — BUPIVACAINE-EPINEPHRINE (PF) 0.5% -1:200000 IJ SOLN
INTRAMUSCULAR | Status: DC | PRN
  Administered 2021-07-02 (×2): 15 mL via PERINEURAL

## 2021-07-02 MED ORDER — CHLORHEXIDINE GLUCONATE 0.12 % MT SOLN
15.0000 mL | Freq: Once | OROMUCOSAL | Status: AC
  Administered 2021-07-02: 15 mL via OROMUCOSAL

## 2021-07-02 MED ORDER — MIDAZOLAM HCL 2 MG/2ML IJ SOLN
INTRAMUSCULAR | Status: AC
  Filled 2021-07-02: qty 2

## 2021-07-02 MED ORDER — CEFAZOLIN SODIUM-DEXTROSE 2-4 GM/100ML-% IV SOLN
2.0000 g | INTRAVENOUS | Status: AC
  Administered 2021-07-02: 2 g via INTRAVENOUS

## 2021-07-02 MED ORDER — DIPHENHYDRAMINE HCL 50 MG/ML IJ SOLN
INTRAMUSCULAR | Status: DC | PRN
  Administered 2021-07-02: 25 mg via INTRAVENOUS

## 2021-07-02 MED ORDER — BUPIVACAINE-EPINEPHRINE (PF) 0.5% -1:200000 IJ SOLN
INTRAMUSCULAR | Status: AC
  Filled 2021-07-02: qty 30

## 2021-07-02 MED ORDER — DIPHENHYDRAMINE HCL 50 MG/ML IJ SOLN
INTRAMUSCULAR | Status: AC
  Filled 2021-07-02: qty 1

## 2021-07-02 MED ORDER — PHENYLEPHRINE HCL-NACL 20-0.9 MG/250ML-% IV SOLN
INTRAVENOUS | Status: AC
  Filled 2021-07-02: qty 250

## 2021-07-02 MED ORDER — PHENYLEPHRINE HCL-NACL 20-0.9 MG/250ML-% IV SOLN
INTRAVENOUS | Status: DC | PRN
  Administered 2021-07-02: 40 ug/min via INTRAVENOUS

## 2021-07-02 MED ORDER — CEFAZOLIN SODIUM-DEXTROSE 2-4 GM/100ML-% IV SOLN
INTRAVENOUS | Status: AC
  Filled 2021-07-02: qty 100

## 2021-07-02 MED ORDER — LIDOCAINE HCL (PF) 2 % IJ SOLN
INTRAMUSCULAR | Status: AC
  Filled 2021-07-02: qty 25

## 2021-07-02 MED ORDER — 0.9 % SODIUM CHLORIDE (POUR BTL) OPTIME
TOPICAL | Status: DC | PRN
  Administered 2021-07-02: 1000 mL

## 2021-07-02 MED ORDER — LACTATED RINGERS IV SOLN: INTRAVENOUS | Status: DC

## 2021-07-02 MED ORDER — DEXAMETHASONE SODIUM PHOSPHATE 4 MG/ML IJ SOLN
INTRAMUSCULAR | Status: DC | PRN
  Administered 2021-07-02: 10 mg via INTRAVENOUS

## 2021-07-02 MED ORDER — ROPIVACAINE HCL 5 MG/ML IJ SOLN
INTRAMUSCULAR | Status: AC
  Filled 2021-07-02: qty 30

## 2021-07-02 MED ORDER — FENTANYL CITRATE PF 50 MCG/ML IJ SOSY: 25.0000 ug | PREFILLED_SYRINGE | INTRAMUSCULAR | Status: DC | PRN

## 2021-07-02 MED ORDER — SUGAMMADEX SODIUM 200 MG/2ML IV SOLN
INTRAVENOUS | Status: DC | PRN
  Administered 2021-07-02: 404.8 mg via INTRAVENOUS

## 2021-07-02 MED ORDER — LIDOCAINE HCL (CARDIAC) PF 100 MG/5ML IV SOSY
PREFILLED_SYRINGE | INTRAVENOUS | Status: DC | PRN
  Administered 2021-07-02: 60 mg via INTRATRACHEAL

## 2021-07-02 MED ORDER — PROPOFOL 10 MG/ML IV BOLUS
INTRAVENOUS | Status: AC
  Filled 2021-07-02: qty 20

## 2021-07-02 MED ORDER — ROCURONIUM BROMIDE 100 MG/10ML IV SOLN
INTRAVENOUS | Status: DC | PRN
  Administered 2021-07-02: 70 mg via INTRAVENOUS

## 2021-07-02 MED ORDER — MIDAZOLAM HCL 5 MG/5ML IJ SOLN
INTRAMUSCULAR | Status: DC | PRN
  Administered 2021-07-02: 4 mg via INTRAVENOUS

## 2021-07-02 MED ORDER — PROPOFOL 10 MG/ML IV BOLUS
INTRAVENOUS | Status: DC | PRN
  Administered 2021-07-02: 200 mg via INTRAVENOUS

## 2021-07-02 MED ORDER — ONDANSETRON HCL 4 MG/2ML IJ SOLN
INTRAMUSCULAR | Status: DC | PRN
  Administered 2021-07-02: 4 mg via INTRAVENOUS

## 2021-07-02 MED ORDER — ONDANSETRON HCL 4 MG/2ML IJ SOLN: 4.0000 mg | Freq: Once | INTRAMUSCULAR | Status: DC | PRN

## 2021-07-02 MED ORDER — ORAL CARE MOUTH RINSE: 15.0000 mL | Freq: Once | OROMUCOSAL | Status: AC

## 2021-07-02 SURGICAL SUPPLY — 47 items
APL PRP STRL LF DISP 70% ISPRP (MISCELLANEOUS) ×1
APL PRP STRL LF ISPRP CHG 10.5 (MISCELLANEOUS) ×1
APL SKNCLS STERI-STRIP NONHPOA (GAUZE/BANDAGES/DRESSINGS) ×1
APPLICATOR CHLORAPREP 10.5 ORG (MISCELLANEOUS) ×1 IMPLANT
BENZOIN TINCTURE PRP APPL 2/3 (GAUZE/BANDAGES/DRESSINGS) ×3 IMPLANT
BLADE HEX COATED 2.75 (ELECTRODE) ×3 IMPLANT
BLADE SURG 15 STRL LF DISP TIS (BLADE) IMPLANT
BLADE SURG 15 STRL SS (BLADE) ×2
CHLORAPREP W/TINT 26 (MISCELLANEOUS) ×3 IMPLANT
CLOTH BEACON ORANGE TIMEOUT ST (SAFETY) ×3 IMPLANT
COVER LIGHT HANDLE STERIS (MISCELLANEOUS) ×6 IMPLANT
DECANTER SPIKE VIAL GLASS SM (MISCELLANEOUS) ×1 IMPLANT
DRAPE ORTHO 2.5IN SPLIT 77X108 (DRAPES) ×4 IMPLANT
DRAPE ORTHO SPLIT 77X108 STRL (DRAPES) ×4
DRSG MEPILEX FLEX 6X6 (GAUZE/BANDAGES/DRESSINGS) ×1 IMPLANT
ELECT REM PT RETURN 9FT ADLT (ELECTROSURGICAL) ×2
ELECTRODE REM PT RTRN 9FT ADLT (ELECTROSURGICAL) ×2 IMPLANT
GLOVE SS N UNI LF 8.5 STRL (GLOVE) ×3 IMPLANT
GLOVE SURG POLYISO LF SZ8 (GLOVE) ×3 IMPLANT
GLOVE SURG UNDER POLY LF SZ7 (GLOVE) ×9 IMPLANT
GOWN STRL REUS W/TWL LRG LVL3 (GOWN DISPOSABLE) ×6 IMPLANT
GOWN STRL REUS W/TWL XL LVL3 (GOWN DISPOSABLE) ×3 IMPLANT
GRAFT TISS 40X70 1.26-1.75 THK (Tissue) IMPLANT
INST SET MINOR BONE (KITS) ×3 IMPLANT
KIT BLADEGUARD II DBL (SET/KITS/TRAYS/PACK) ×3 IMPLANT
KIT TURNOVER KIT A (KITS) ×3 IMPLANT
MANIFOLD NEPTUNE II (INSTRUMENTS) ×3 IMPLANT
MARKER SKIN DUAL TIP RULER LAB (MISCELLANEOUS) ×3 IMPLANT
NDL HYPO 21X1.5 SAFETY (NEEDLE) ×2 IMPLANT
NDL SCORPION MULTI FIRE (NEEDLE) IMPLANT
NEEDLE HYPO 21X1.5 SAFETY (NEEDLE) ×2 IMPLANT
NEEDLE SCORPION MULTI FIRE (NEEDLE) ×2 IMPLANT
NS IRRIG 1000ML POUR BTL (IV SOLUTION) ×3 IMPLANT
PACK TOTAL JOINT (CUSTOM PROCEDURE TRAY) ×3 IMPLANT
PAD ARMBOARD 7.5X6 YLW CONV (MISCELLANEOUS) ×3 IMPLANT
SET BASIN LINEN APH (SET/KITS/TRAYS/PACK) ×3 IMPLANT
SLING ARM IMMOBILIZER LRG (SOFTGOODS) ×1 IMPLANT
SPONGE T-LAP 18X18 ~~LOC~~+RFID (SPONGE) ×2 IMPLANT
STRIP CLOSURE SKIN 1/2X4 (GAUZE/BANDAGES/DRESSINGS) ×2 IMPLANT
SUT ETHIBOND NAB OS 4 #2 30IN (SUTURE) ×4 IMPLANT
SUT MON AB 0 CT1 (SUTURE) ×3 IMPLANT
SUT MON AB 2-0 CT1 36 (SUTURE) ×3 IMPLANT
SUT VICRYL 0 UR6 27IN ABS (SUTURE) ×1 IMPLANT
SYR 30ML LL (SYRINGE) ×1 IMPLANT
SYR BULB IRRIG 60ML STRL (SYRINGE) ×3 IMPLANT
TISSUE ARTHOFLEX  1.26-1.75MM (Tissue) ×2 IMPLANT
TISSUE ARTHOFLEX 1.26-1.75MM (Tissue) ×1 IMPLANT

## 2021-07-02 NOTE — Op Note (Signed)
07/02/2021 ? ?11:54 AM ? ?PATIENT:  Hannah Cooper  66 y.o. female ? ?PRE-OPERATIVE DIAGNOSIS:  Rotator cuff tear left shoulder ? ?POST-OPERATIVE DIAGNOSIS:  Partial Rotator cuff tear left shoulder ? ?Findings at surgery mostly an intact rotator cuff tear except for the very anterior fibers of the supraspinatus which had pending without full-thickness tearing of the supraspinatus tendon of the rotator cuff ? ? ? ?PROCEDURE:  Procedure(s): ?ROTATOR CUFF REPAIR SHOULDER OPEN with Patch Graft (Left) ? ?Patch graft ? ?AFLEX, 1.25 mm thickness cut down to fit the defect ?Human dermis ? ?Details of procedure ? ?The patient was cleared for surgery after evaluation in the preop area.  A review of the MRI report as the films were not available on the computer but had previously been viewed in the office, review of the skin and examination.  She had a preop paracervical block ? ?She was taken to the operating room where it took some time to place an IV after that was done patient was intubated and a second IV was placed in the nonoperative arm she was placed in the beachchair position the arm was prepped and draped including the shoulder and chest.  Sterile draping technique was performed and timeout was completed ? ?I injected the subcutaneous tissue with Marcaine with epinephrine injecting the previous incision ? ?I used the previous incision divided down to the deltoid fascia undermined the skin flaps and then split the deltoid fascia up to the acromion.  I did not take the deltoid off the acromion ? ?The cuff was evaluated by internally and externally rotating the arm the previous sutures were intact the rotator cuff was completely covering the head.  At the anterior lateral margin there was thinning of the cuff and this was treated with a patch dermal graft which was cut down to appropriate thickness and then repaired with approximately nine #1 Vicryl sutures as the gun device deployed twice but did not work ? ?Wound  was irrigated I did a small acromioplasty and then check range of motion of the arm to make sure that the graft did not impinge. ? ?Irrigated the wound and then closed with #2 Ethibond for the deltoid split 0 Monocryl for the subcutaneous tissue and then a 2-0 running Monocryl with benzoin and Steri-Strips to close the skin sterile dressing applied and then a sling and swathe was applied ? ?Patient was extubated taken to recovery room in stable condition ? ?SURGEON:  Surgeon(s) and Role: ?   Carole Civil, MD - Primary ? ?PHYSICIAN ASSISTANT:  ? ?ASSISTANTS: Nikki  ? ?ANESTHESIA:   general and paracervical block ? ?EBL:  min  ? ?BLOOD ADMINISTERED:none ? ?DRAINS: none  ? ?LOCAL MEDICATIONS USED:  MARCAINE    ? ?SPECIMEN:  No Specimen ? ?DISPOSITION OF SPECIMEN:  N/A ? ?COUNTS:  YES ? ?TOURNIQUET:  * No tourniquets in log * ? ?DICTATION: .Dragon Dictation ? ?PLAN OF CARE: Discharge to home after PACU ? ?PATIENT DISPOSITION:  PACU - hemodynamically stable. ?  ?Delay start of Pharmacological VTE agent (>24hrs) due to surgical blood loss or risk of bleeding: not applicable ? ? ? ?Postop plan ? ?It is okay to remove the sling no active exercises or strengthening exercises for 2 weeks ? ?At 2 weeks the patient can start full range of motion exercises strengthening exercises involving the deltoid and then cuff exercises with the arm at her side for internal and external rotation ? ?After 6 weeks she can start abduction  and flexion exercises past 90 degrees ? ? ?

## 2021-07-02 NOTE — Brief Op Note (Signed)
07/02/2021 ? ?11:48 AM ? ?PATIENT:  Hannah Cooper  66 y.o. female ? ?PRE-OPERATIVE DIAGNOSIS:  Rotator cuff tear left shoulder ? ?POST-OPERATIVE DIAGNOSIS:  Partial Rotator cuff tear left shoulder ? ?Findings at surgery mostly an intact rotator cuff tear except for the very anterior fibers of the supraspinatus which had pending without full-thickness tearing of the supraspinatus tendon of the rotator cuff ? ? ? ?PROCEDURE:  Procedure(s): ?ROTATOR CUFF REPAIR SHOULDER OPEN with Patch Graft (Left) ? ?Patch graft ? ?AFLEX, 1.25 mm thickness cut down to fit the defect ?Human dermis ? ?Details of procedure ? ?The patient was cleared for surgery after evaluation in the preop area.  A review of the MRI report as the films were not available on the computer but had previously been viewed in the office, review of the skin and examination.  She had a preop paracervical block ? ?She was taken to the operating room where it took some time to place an IV after that was done patient was intubated and a second IV was placed in the nonoperative arm she was placed in the beachchair position the arm was prepped and draped including the shoulder and chest.  Sterile draping technique was performed and timeout was completed ? ?I injected the subcutaneous tissue with Marcaine with epinephrine injecting the previous incision ? ?I used the previous incision divided down to the deltoid fascia undermined the skin flaps and then split the deltoid fascia up to the acromion.  I did not take the deltoid off the acromion ? ?The cuff was evaluated by internally and externally rotating the arm the previous sutures were intact the rotator cuff was completely covering the head.  At the anterior lateral margin there was thinning of the cuff and this was treated with a patch dermal graft which was cut down to appropriate thickness and then repaired with approximately nine #1 Vicryl sutures as the gun device deployed twice but did not work ? ?Wound  was irrigated I did a small acromioplasty and then check range of motion of the arm to make sure that the graft did not impinge. ? ?Irrigated the wound and then closed with #2 Ethibond for the deltoid split 0 Monocryl for the subcutaneous tissue and then a 2-0 running Monocryl with benzoin and Steri-Strips to close the skin sterile dressing applied and then a sling and swathe was applied ? ?Patient was extubated taken to recovery room in stable condition ? ?SURGEON:  Surgeon(s) and Role: ?   Carole Civil, MD - Primary ? ?PHYSICIAN ASSISTANT:  ? ?ASSISTANTS: Nikki  ? ?ANESTHESIA:   general and paracervical block ? ?EBL:  min  ? ?BLOOD ADMINISTERED:none ? ?DRAINS: none  ? ?LOCAL MEDICATIONS USED:  MARCAINE    ? ?SPECIMEN:  No Specimen ? ?DISPOSITION OF SPECIMEN:  N/A ? ?COUNTS:  YES ? ?TOURNIQUET:  * No tourniquets in log * ? ?DICTATION: .Dragon Dictation ? ?PLAN OF CARE: Discharge to home after PACU ? ?PATIENT DISPOSITION:  PACU - hemodynamically stable. ?  ?Delay start of Pharmacological VTE agent (>24hrs) due to surgical blood loss or risk of bleeding: not applicable ? ? ? ?Postop plan ? ?It is okay to remove the sling no active exercises or strengthening exercises for 2 weeks ? ?At 2 weeks the patient can start full range of motion exercises strengthening exercises involving the deltoid and then cuff exercises with the arm at her side for internal and external rotation ? ?After 6 weeks she can start abduction  and flexion exercises past 90 degrees ? ? ? ?

## 2021-07-02 NOTE — Interval H&P Note (Signed)
History and Physical Interval Note: ? ?07/02/2021 ?9:18 AM ? ?Hannah Cooper  has presented today for surgery, with the diagnosis of Rotator cuff tear left shoulder.  The various methods of treatment have been discussed with the patient and family. After consideration of risks, benefits and other options for treatment, the patient has consented to  Procedure(s): ?ROTATOR CUFF REPAIR SHOULDER OPEN with possible superior Capsular Reconstruction (Left) as a surgical intervention.  The patient's history has been reviewed, patient examined, no change in status, stable for surgery.  I have reviewed the patient's chart and labs.  Questions were answered to the patient's satisfaction.   ? ? ?Arther Abbott ? ? ?

## 2021-07-02 NOTE — Anesthesia Procedure Notes (Signed)
Procedure Name: Intubation ?Date/Time: 07/02/2021 10:20 AM ?Performed by: Minerva Ends, CRNA ?Pre-anesthesia Checklist: Patient identified, Emergency Drugs available, Suction available and Patient being monitored ?Patient Re-evaluated:Patient Re-evaluated prior to induction ?Oxygen Delivery Method: Circle system utilized ?Preoxygenation: Pre-oxygenation with 100% oxygen ?Induction Type: IV induction ?Ventilation: Mask ventilation without difficulty ?Laryngoscope Size: Mac, 3 and Glidescope ?Grade View: Grade III ?Tube type: Oral ?Tube size: 7.0 mm ?Number of attempts: 2 ?Airway Equipment and Method: Stylet and Oral airway ?Placement Confirmation: ETT inserted through vocal cords under direct vision, positive ETCO2 and breath sounds checked- equal and bilateral ?Secured at: 22 cm ?Tube secured with: Tape ?Dental Injury: Teeth and Oropharynx as per pre-operative assessment  ?Difficulty Due To: Difficulty was unanticipated ?Comments: Attempt 1: unsuccessful grade 3/4 view with mac 3 DL ?Attempt 2: successful grade 1 view glidescope intubation mac 3 ? ? ? ? ?

## 2021-07-02 NOTE — Anesthesia Preprocedure Evaluation (Signed)
Anesthesia Evaluation  ?Patient identified by MRN, date of birth, ID band ?Patient awake ? ? ? ?Reviewed: ?Allergy & Precautions, H&P , NPO status , Patient's Chart, lab work & pertinent test results, reviewed documented beta blocker date and time  ? ?Airway ?Mallampati: II ? ?TM Distance: >3 FB ?Neck ROM: full ? ? ? Dental ?no notable dental hx. ?(+) Dental Advisory Given, Missing ?  ?Pulmonary ?shortness of breath and with exertion, pneumonia, COPD,  COPD inhaler,  ?  ?Pulmonary exam normal ?breath sounds clear to auscultation ? ? ? ? ? ? Cardiovascular ?Exercise Tolerance: Good ?hypertension, Pt. on medications ?+CHF  ?Normal cardiovascular exam+ dysrhythmias (PACs,PVCs)  ?Rhythm:regular Rate:Normal ? ? ?  ?Neuro/Psych ? Neuromuscular disease negative psych ROS  ? GI/Hepatic ?negative GI ROS, Neg liver ROS,   ?Endo/Other  ?negative endocrine ROSdiabetes, Type 2 ? Renal/GU ?negative Renal ROS  ?negative genitourinary ?  ?Musculoskeletal ? ?(+) Arthritis  (lumbar radiculopathy),  ? Abdominal ?  ?Peds ? Hematology ?negative hematology ROS ?(+)   ?Anesthesia Other Findings ?as per patient never had CVA, left sided weakness after radiation, risk of nerve injury and temporary SOB was explained to the patient and patient agreed to get nerve block for postop pain. ? Reproductive/Obstetrics ?negative OB ROS ? ?  ? ? ? ? ? ? ? ? ? ? ? ? ? ?  ?  ? ? ? ? ? ? ? ? ?Anesthesia Physical ? ?Anesthesia Plan ? ?ASA: 3 ? ?Anesthesia Plan: General and General ETT  ? ?Post-op Pain Management: Regional block*  ? ?Induction: Intravenous ? ?PONV Risk Score and Plan: 4 or greater and Ondansetron, Dexamethasone and Midazolam ? ?Airway Management Planned: Oral ETT ? ?Additional Equipment:  ? ?Intra-op Plan:  ? ?Post-operative Plan: Extubation in OR ? ?Informed Consent: I have reviewed the patients History and Physical, chart, labs and discussed the procedure including the risks, benefits and alternatives  for the proposed anesthesia with the patient or authorized representative who has indicated his/her understanding and acceptance.  ? ? ? ?Dental Advisory Given ? ?Plan Discussed with: CRNA ? ?Anesthesia Plan Comments:   ? ? ? ? ? ? ?Anesthesia Quick Evaluation ? ?

## 2021-07-02 NOTE — Anesthesia Postprocedure Evaluation (Signed)
Anesthesia Post Note ? ?Patient: Hannah Cooper ? ?Procedure(s) Performed: ROTATOR CUFF REPAIR SHOULDER OPEN with Patch Graft (Left: Shoulder) ? ?Patient location during evaluation: Phase II ?Anesthesia Type: General ?Level of consciousness: awake ?Pain management: pain level controlled ?Vital Signs Assessment: post-procedure vital signs reviewed and stable ?Respiratory status: spontaneous breathing and respiratory function stable ?Cardiovascular status: blood pressure returned to baseline and stable ?Postop Assessment: no headache and no apparent nausea or vomiting ?Anesthetic complications: no ?Comments: Late entry ? ? ?No notable events documented. ? ? ?Last Vitals:  ?Vitals:  ? 07/02/21 1245 07/02/21 1300  ?BP: (!) 143/74   ?Pulse: (!) 57 60  ?Resp: 17 18  ?Temp:    ?SpO2: 98% 97%  ?  ?Last Pain:  ?Vitals:  ? 07/02/21 1245  ?PainSc: 0-No pain  ? ? ?  ?  ?  ?  ?  ?  ? ?Louann Sjogren ? ? ? ? ?

## 2021-07-02 NOTE — Brief Op Note (Signed)
07/02/2021 ? ?11:54 AM ? ?PATIENT:  Hannah Cooper  66 y.o. female ? ?PRE-OPERATIVE DIAGNOSIS:  Rotator cuff tear left shoulder ? ?POST-OPERATIVE DIAGNOSIS:  Partial Rotator cuff tear left shoulder ? ?Findings at surgery mostly an intact rotator cuff tear except for the very anterior fibers of the supraspinatus which had pending without full-thickness tearing of the supraspinatus tendon of the rotator cuff ? ? ? ?PROCEDURE:  Procedure(s): ?ROTATOR CUFF REPAIR SHOULDER OPEN with Patch Graft (Left) ? ?Patch graft ? ?AFLEX, 1.25 mm thickness cut down to fit the defect ?Human dermis ? ?Details of procedure ? ?The patient was cleared for surgery after evaluation in the preop area.  A review of the MRI report as the films were not available on the computer but had previously been viewed in the office, review of the skin and examination.  She had a preop paracervical block ? ?She was taken to the operating room where it took some time to place an IV after that was done patient was intubated and a second IV was placed in the nonoperative arm she was placed in the beachchair position the arm was prepped and draped including the shoulder and chest.  Sterile draping technique was performed and timeout was completed ? ?I injected the subcutaneous tissue with Marcaine with epinephrine injecting the previous incision ? ?I used the previous incision divided down to the deltoid fascia undermined the skin flaps and then split the deltoid fascia up to the acromion.  I did not take the deltoid off the acromion ? ?The cuff was evaluated by internally and externally rotating the arm the previous sutures were intact the rotator cuff was completely covering the head.  At the anterior lateral margin there was thinning of the cuff and this was treated with a patch dermal graft which was cut down to appropriate thickness and then repaired with approximately nine #1 Vicryl sutures as the gun device deployed twice but did not work ? ?Wound  was irrigated I did a small acromioplasty and then check range of motion of the arm to make sure that the graft did not impinge. ? ?Irrigated the wound and then closed with #2 Ethibond for the deltoid split 0 Monocryl for the subcutaneous tissue and then a 2-0 running Monocryl with benzoin and Steri-Strips to close the skin sterile dressing applied and then a sling and swathe was applied ? ?Patient was extubated taken to recovery room in stable condition ? ?SURGEON:  Surgeon(s) and Role: ?   Carole Civil, MD - Primary ? ?PHYSICIAN ASSISTANT:  ? ?ASSISTANTS: Nikki  ? ?ANESTHESIA:   general and paracervical block ? ?EBL:  min  ? ?BLOOD ADMINISTERED:none ? ?DRAINS: none  ? ?LOCAL MEDICATIONS USED:  MARCAINE    ? ?SPECIMEN:  No Specimen ? ?DISPOSITION OF SPECIMEN:  N/A ? ?COUNTS:  YES ? ?TOURNIQUET:  * No tourniquets in log * ? ?DICTATION: .Dragon Dictation ? ?PLAN OF CARE: Discharge to home after PACU ? ?PATIENT DISPOSITION:  PACU - hemodynamically stable. ?  ?Delay start of Pharmacological VTE agent (>24hrs) due to surgical blood loss or risk of bleeding: not applicable ? ? ? ?Postop plan ? ?It is okay to remove the sling no active exercises or strengthening exercises for 2 weeks ? ?At 2 weeks the patient can start full range of motion exercises strengthening exercises involving the deltoid and then cuff exercises with the arm at her side for internal and external rotation ? ?After 6 weeks she can start abduction  and flexion exercises past 90 degrees ? ? ? ?

## 2021-07-02 NOTE — Anesthesia Procedure Notes (Signed)
Anesthesia Regional Block: Interscalene brachial plexus block  ? ?Pre-Anesthetic Checklist: , timeout performed,  Correct Patient, Correct Site, Correct Laterality,  Correct Procedure, Correct Position, site marked,  Risks and benefits discussed,  Surgical consent,  Pre-op evaluation,  At surgeon's request and post-op pain management ? ?Laterality: Left ? ?Prep: chloraprep     ?  ?Needles:  ?Injection technique: Single-shot ? ?Needle Type: Stimulator Needle - 80   ? ? ?Needle Length: 9cm  ?Needle Gauge: 22  ? ? ? ?Additional Needles: ? ? ?Procedures:, nerve stimulator,,, ultrasound used (permanent image in chart),,    ? ?Nerve Stimulator or Paresthesia:  ?Response: Thenar twitch, 0.4 mA ? ?Additional Responses:  ? ?Narrative:  ?Start time: 07/02/2021 7:54 AM ?End time: 07/02/2021 8:04 AM ?Injection made incrementally with aspirations every 5 mL. ? ?Performed by: Personally  ? ? ? ?

## 2021-07-02 NOTE — Progress Notes (Signed)
Instructed on incentive spirometer. 1500 ml obtained. Tolerated well. 

## 2021-07-02 NOTE — Transfer of Care (Signed)
Immediate Anesthesia Transfer of Care Note ? ?Patient: Hannah Cooper ? ?Procedure(s) Performed: ROTATOR CUFF REPAIR SHOULDER OPEN with Patch Graft (Left: Shoulder) ? ?Patient Location: PACU ? ?Anesthesia Type:GA combined with regional for post-op pain ? ?Level of Consciousness: sedated ? ?Airway & Oxygen Therapy: Patient Spontanous Breathing and Patient connected to nasal cannula oxygen ? ?Post-op Assessment: Report given to RN and Post -op Vital signs reviewed and stable ? ?Post vital signs: Reviewed and stable ? ?Last Vitals:  ?Vitals Value Taken Time  ?BP 135/62 07/02/21 1157  ?Temp    ?Pulse 55 07/02/21 1159  ?Resp 10 07/02/21 1159  ?SpO2 100 % 07/02/21 1159  ?Vitals shown include unvalidated device data. ? ?Last Pain:  ?Vitals:  ? 07/02/21 0824  ?PainSc: 0-No pain  ?   ? ?  ? ?Complications: No notable events documented. ?

## 2021-07-04 ENCOUNTER — Other Ambulatory Visit: Payer: Self-pay | Admitting: Physician Assistant

## 2021-07-05 ENCOUNTER — Telehealth: Payer: Self-pay | Admitting: Orthopedic Surgery

## 2021-07-05 NOTE — Telephone Encounter (Signed)
Patient called at 12:16 pm left voicemail wanting to know what medicine she needs to be taking.   ? ?Please call her back at [phone]. ?

## 2021-07-05 NOTE — Telephone Encounter (Signed)
Patient called stating she wasn't given a prescription for pain medication or an antibiotic. She would like to know if she needs to be taking those. If so, please send prescriptions to [facility] in [place].  ? ?Thank you.  ?

## 2021-07-06 ENCOUNTER — Encounter (HOSPITAL_COMMUNITY): Payer: Self-pay | Admitting: Orthopedic Surgery

## 2021-07-15 ENCOUNTER — Ambulatory Visit (INDEPENDENT_AMBULATORY_CARE_PROVIDER_SITE_OTHER): Payer: HMO | Admitting: Orthopedic Surgery

## 2021-07-15 DIAGNOSIS — Z9889 Other specified postprocedural states: Secondary | ICD-10-CM

## 2021-07-15 NOTE — Progress Notes (Signed)
FOLLOW UP  ? ?Encounter Diagnosis  ?Name Primary?  ? S/P left rotator cuff repair Yes  ? ? ? ?Chief Complaint  ?Patient presents with  ? Shoulder Pain  ?  S/p RCR LT shoulder ?DOS 07/02/21  ? ? ? ?66 year old female status post revision rotator cuff repair found to have a partial tear and was treated with rotator cuff patch ? ?We used an a flex 1.25 mm thickness human dermis patch ? ?We found mostly intact rotator cuff except for the very anterior fibers of the supraspinatus which had partial tearing ? ?She complains of soreness her wound looks fine she can start passive range of motion and do that for the next 4 weeks ? ?Continue the sling but can be removed 3 times a day while she is eating.  It is okay to do the tabletop activities ? ?Follow-up in 4 weeks ? ?The patient has pain medication from her cancer doctor and is taking either hydrocodone or oxycodone but she will call me in 4 to 5 days when that runs out for a refill ?

## 2021-07-16 ENCOUNTER — Telehealth: Payer: Self-pay

## 2021-07-16 NOTE — Telephone Encounter (Signed)
Patient called asking if Dr. Aline Brochure would do an order for her to do home health therapy  ?due to her lack of transportation. Please call [phone] more information is needed. ?

## 2021-07-19 ENCOUNTER — Telehealth: Payer: Self-pay | Admitting: Orthopedic Surgery

## 2021-07-19 ENCOUNTER — Other Ambulatory Visit: Payer: Self-pay

## 2021-07-19 ENCOUNTER — Other Ambulatory Visit: Payer: Self-pay | Admitting: Orthopedic Surgery

## 2021-07-19 DIAGNOSIS — M75122 Complete rotator cuff tear or rupture of left shoulder, not specified as traumatic: Secondary | ICD-10-CM

## 2021-07-19 DIAGNOSIS — Z9889 Other specified postprocedural states: Secondary | ICD-10-CM

## 2021-07-19 DIAGNOSIS — G8918 Other acute postprocedural pain: Secondary | ICD-10-CM

## 2021-07-19 MED ORDER — HYDROCODONE-ACETAMINOPHEN 10-325 MG PO TABS: 1.0000 | ORAL_TABLET | Freq: Four times a day (QID) | ORAL | 0 refills | Status: DC | PRN

## 2021-07-19 NOTE — Telephone Encounter (Signed)
Patient called back; aware of the referral/request made to home therapy. ?

## 2021-07-19 NOTE — Telephone Encounter (Signed)
Done

## 2021-07-19 NOTE — Progress Notes (Signed)
Meds ordered this encounter  ?Medications  ? HYDROcodone-acetaminophen (NORCO) 10-325 MG tablet  ?  Sig: Take 1 tablet by mouth every 6 (six) hours as needed.  ?  Dispense:  30 tablet  ?  Refill:  0  ? ? ?

## 2021-07-19 NOTE — Telephone Encounter (Signed)
Patient called, she had requested the Rx go to [facility], not [facility].  Can you resend it for her?  To [facility].   ?

## 2021-07-19 NOTE — Telephone Encounter (Signed)
Please see note and advise  

## 2021-07-19 NOTE — Telephone Encounter (Signed)
Patient called for refill of pain medication - states not sure if Dr Aline Brochure would want it to be Hydrocodone or Oxycodone,  states prescription from other provider for her cancer runs out today. Pharmacy: ? [facility] ?  ?Also has question about bandage - should she change it?  Aware of next scheduled appointment: 08/12/21 ?

## 2021-07-19 NOTE — Telephone Encounter (Signed)
HHPT order entered per provider OK ?

## 2021-07-20 ENCOUNTER — Encounter (HOSPITAL_COMMUNITY): Payer: 59 | Admitting: Physical Therapy

## 2021-07-22 ENCOUNTER — Ambulatory Visit (HOSPITAL_COMMUNITY): Payer: 59 | Attending: Family Medicine | Admitting: Physical Therapy

## 2021-07-22 ENCOUNTER — Encounter (HOSPITAL_COMMUNITY): Payer: Self-pay | Admitting: Physical Therapy

## 2021-07-22 DIAGNOSIS — M545 Low back pain, unspecified: Secondary | ICD-10-CM | POA: Diagnosis not present

## 2021-07-22 DIAGNOSIS — R29898 Other symptoms and signs involving the musculoskeletal system: Secondary | ICD-10-CM | POA: Diagnosis not present

## 2021-07-22 DIAGNOSIS — M25551 Pain in right hip: Secondary | ICD-10-CM | POA: Insufficient documentation

## 2021-07-22 DIAGNOSIS — M542 Cervicalgia: Secondary | ICD-10-CM | POA: Diagnosis not present

## 2021-07-22 DIAGNOSIS — M6281 Muscle weakness (generalized): Secondary | ICD-10-CM | POA: Diagnosis not present

## 2021-07-22 DIAGNOSIS — R2689 Other abnormalities of gait and mobility: Secondary | ICD-10-CM | POA: Insufficient documentation

## 2021-07-22 DIAGNOSIS — M25612 Stiffness of left shoulder, not elsewhere classified: Secondary | ICD-10-CM | POA: Insufficient documentation

## 2021-07-22 DIAGNOSIS — M25512 Pain in left shoulder: Secondary | ICD-10-CM | POA: Insufficient documentation
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
## 2021-07-27 ENCOUNTER — Ambulatory Visit (HOSPITAL_COMMUNITY): Payer: 59 | Admitting: Physical Therapy

## 2021-07-27 ENCOUNTER — Encounter (HOSPITAL_COMMUNITY): Payer: Self-pay | Admitting: Physical Therapy

## 2021-07-27 DIAGNOSIS — M25551 Pain in right hip: Secondary | ICD-10-CM

## 2021-07-27 DIAGNOSIS — M545 Low back pain, unspecified: Secondary | ICD-10-CM

## 2021-07-27 DIAGNOSIS — R29898 Other symptoms and signs involving the musculoskeletal system: Secondary | ICD-10-CM

## 2021-07-27 DIAGNOSIS — M6281 Muscle weakness (generalized): Secondary | ICD-10-CM

## 2021-07-27 DIAGNOSIS — R2689 Other abnormalities of gait and mobility: Secondary | ICD-10-CM

## 2021-07-27 DIAGNOSIS — M542 Cervicalgia: Secondary | ICD-10-CM

## 2021-07-27 NOTE — Patient Instructions (Signed)
Access Code: [email or web address] ?URL: [email or web address] ?Date: 07/27/2021 ?Prepared by: Mitzi Hansen Athony Coppa ? ?Exercises ?- Standing Hip Abduction with Resistance at Ankles and Counter Support  - 1 x daily - 7 x weekly - 3 sets - 10 reps ?
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
## 2021-07-29 ENCOUNTER — Encounter (HOSPITAL_COMMUNITY): Payer: Self-pay | Admitting: Physical Therapy

## 2021-07-29 ENCOUNTER — Ambulatory Visit (HOSPITAL_COMMUNITY): Payer: 59 | Admitting: Physical Therapy

## 2021-07-29 DIAGNOSIS — E669 Obesity, unspecified: Secondary | ICD-10-CM | POA: Diagnosis not present

## 2021-07-29 DIAGNOSIS — E782 Mixed hyperlipidemia: Secondary | ICD-10-CM | POA: Diagnosis not present

## 2021-07-29 DIAGNOSIS — I10 Essential (primary) hypertension: Secondary | ICD-10-CM | POA: Diagnosis not present

## 2021-07-29 DIAGNOSIS — Z1331 Encounter for screening for depression: Secondary | ICD-10-CM | POA: Diagnosis not present

## 2021-07-29 DIAGNOSIS — Z6838 Body mass index (BMI) 38.0-38.9, adult: Secondary | ICD-10-CM | POA: Diagnosis not present

## 2021-07-29 DIAGNOSIS — M25551 Pain in right hip: Secondary | ICD-10-CM

## 2021-07-29 DIAGNOSIS — E118 Type 2 diabetes mellitus with unspecified complications: Secondary | ICD-10-CM | POA: Diagnosis not present

## 2021-07-29 DIAGNOSIS — M545 Low back pain, unspecified: Secondary | ICD-10-CM

## 2021-07-29 DIAGNOSIS — M6281 Muscle weakness (generalized): Secondary | ICD-10-CM

## 2021-07-29 DIAGNOSIS — Z0001 Encounter for general adult medical examination with abnormal findings: Secondary | ICD-10-CM | POA: Diagnosis not present

## 2021-07-29 DIAGNOSIS — I509 Heart failure, unspecified: Secondary | ICD-10-CM | POA: Diagnosis not present

## 2021-07-29 DIAGNOSIS — C9030 Solitary plasmacytoma not having achieved remission: Secondary | ICD-10-CM | POA: Diagnosis not present

## 2021-07-29 DIAGNOSIS — R29898 Other symptoms and signs involving the musculoskeletal system: Secondary | ICD-10-CM

## 2021-07-29 DIAGNOSIS — M542 Cervicalgia: Secondary | ICD-10-CM

## 2021-07-29 DIAGNOSIS — Z9089 Acquired absence of other organs: Secondary | ICD-10-CM | POA: Diagnosis not present

## 2021-07-29 DIAGNOSIS — R2689 Other abnormalities of gait and mobility: Secondary | ICD-10-CM
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
## 2021-08-02 ENCOUNTER — Ambulatory Visit (HOSPITAL_COMMUNITY): Payer: 59 | Admitting: Occupational Therapy

## 2021-08-02 ENCOUNTER — Encounter (HOSPITAL_COMMUNITY): Payer: Self-pay | Admitting: Occupational Therapy

## 2021-08-02 DIAGNOSIS — M25512 Pain in left shoulder: Secondary | ICD-10-CM

## 2021-08-02 DIAGNOSIS — R29898 Other symptoms and signs involving the musculoskeletal system: Secondary | ICD-10-CM

## 2021-08-02 DIAGNOSIS — M25551 Pain in right hip: Secondary | ICD-10-CM | POA: Diagnosis not present

## 2021-08-02 DIAGNOSIS — M25612 Stiffness of left shoulder, not elsewhere classified: Secondary | ICD-10-CM

## 2021-08-02 NOTE — Therapy (Signed)
?OUTPATIENT OCCUPATIONAL THERAPY ORTHO EVALUATION ? ?Patient Name: Hannah Cooper ?MRN: [id number] ?DOB:1955/11/15, 66 y.o., female ?Today's Date: 08/02/2021 ? ?PCP: Jake Samples, PA-C ?REFERRING PROVIDER: Dr. Arther Abbott ? ? OT End of Session - 08/02/21 1031   ? ? Visit Number 1   ? Number of Visits 16   ? Date for OT Re-Evaluation 10/01/21   mini-reassessment 08/30/2021  ? Authorization Type 1) [employer], $20 copay 2) Generic [employer]   ? Authorization Time Period [employer]-60 visit limit combined (PT/OT/ST), 16 used on eval   ? Authorization - Visit Number 17   ? Authorization - Number of Visits 60   ? Progress Note Due on Visit 10   ? OT Start Time [phone]   ? OT Stop Time 1028   ? OT Time Calculation (min) 40 min   ? Activity Tolerance Patient tolerated treatment well   ? Behavior During Therapy [facility] for tasks assessed/performed   ? ?  ?  ? ?  ? ? ?Past Medical History:  ?Diagnosis Date  ? Arthritis   ? Atypical chest pain   ? chronic  ? Cancer [facility])   ? bone cancer  ? Chronic back pain   ? Chronic diastolic CHF (congestive heart failure) ([place])   ? COPD (chronic obstructive pulmonary disease) ([place])   ? DDD (degenerative disc disease), cervical   ? Diabetes mellitus without complication ([place])   ? History of radiation therapy 07/16/19-08/22/19  ? L4 spine ; Dr. Gery Pray  ? Hyperlipidemia   ? Hypertension   ? Lumbar radiculopathy   ? Meningitis   ? Normal coronary arteries 2014  ? Palpitations   ? Premature atrial contractions   ? PVC's (premature ventricular contractions)   ? ?Past Surgical History:  ?Procedure Laterality Date  ? ABDOMINAL HYSTERECTOMY    ? BREAST LUMPECTOMY WITH RADIOFREQUENCY TAG IDENTIFICATION Right 01/25/2021  ? Procedure: BREAST LUMPECTOMY WITH RADIOFREQUENCY TAG IDENTIFICATION;  Surgeon: Virl Cagey, MD;  Location: [facility];  Service: General;  Laterality: Right;  ? EXCISION OF BREAST BIOPSY Right 01/25/2021  ? Procedure: EXCISION OF BREAST BIOPSY;  Surgeon: Virl Cagey, MD;  Location: [facility];  Service: General;  Laterality: Right;  ? IR FLUORO GUIDED NEEDLE PLC ASPIRATION/INJECTION LOC  06/19/2019  ? LEFT HEART CATHETERIZATION WITH CORONARY ANGIOGRAM N/A 09/05/2012  ? Procedure: LEFT HEART CATHETERIZATION WITH CORONARY ANGIOGRAM;  Surgeon: Wellington Hampshire, MD;  Location: [facility] CATH LAB;  Service: Cardiovascular;  Laterality: N/A;  ? RESECTION DISTAL CLAVICAL Left 07/03/2020  ? Procedure: RESECTION DISTAL CLAVICAL;  Surgeon: Carole Civil, MD;  Location: [facility];  Service: Orthopedics;  Laterality: Left;  ? SHOULDER OPEN ROTATOR CUFF REPAIR Left 07/03/2020  ? Procedure: ROTATOR CUFF REPAIR SHOULDER OPEN WITH CHROMEOPLASTY;  Surgeon: Carole Civil, MD;  Location: [facility];  Service: Orthopedics;  Laterality: Left;  ? SHOULDER OPEN ROTATOR CUFF REPAIR Left 07/02/2021  ? Procedure: ROTATOR CUFF REPAIR SHOULDER OPEN with Patch Graft;  Surgeon: Carole Civil, MD;  Location: [facility];  Service: Orthopedics;  Laterality: Left;  ? THYROID SURGERY  2002  ? ?Patient Active Problem List  ? Diagnosis Date Noted  ? Traumatic incomplete tear of left rotator cuff   ? Intraductal papilloma of right breast 01/05/2021  ? S/p left rotator cuff repair distal clavicle resection 07/03/20  07/07/2020  ? Complete tear of left rotator cuff   ? 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[place]) 07/10/2019  ? Plasma cell disorder 06/10/2019  ?  Lesion of bone of lumbosacral spine 05/28/2019  ? Left-sided weakness 03/13/2016  ? Cerebrovascular accident (CVA) ([place])   ? Palpitations   ? COPD (chronic obstructive pulmonary disease) ([place]) 08/20/2014  ? Dyspnea 12/19/2013  ? Chronic diastolic CHF (congestive heart failure) ([place]) 07/05/2013  ? Lower extremity edema 07/05/2013  ? Hyperlipidemia   ? Chest pain 07/04/2013  ? Hypokalemia 09/04/2012  ? Precordial pain 09/04/2012  ? Viral meningitis 01/22/2011  ? PNEUMONIA, LEFT LOWER LOBE 10/10/2006  ? DISEASE, ACUTE BRONCHOSPASM 10/10/2006  ? Essential  hypertension 05/23/2006  ? OSTEOARTHRITIS 05/23/2006  ? ? ?ONSET DATE: 07/02/2021 ? ?REFERRING DIAG: s/p left RCR ? ?THERAPY DIAG:  ?Acute pain of left shoulder ? ?Stiffness of left shoulder, not elsewhere classified ? ?Other symptoms and signs involving the musculoskeletal system ? ?SUBJECTIVE:  ? ?SUBJECTIVE STATEMENT: ?S: I think the pain medication just makes me go to sleep.  ?Pt accompanied by: self ? ?PERTINENT HISTORY: Pt is a 66 y/o female s/p left RCR on 07/02/21. Pt previously had a RCR in 2022, however in March of 2023 pt reached to catch her grandchild falling of the bed and felt a sharp pain, had a supraspinatus tear. Pt was referred to occupational therapy for evaluation and treatment by Dr. Arther Abbott. ? ?PRECAUTIONS: Shoulder ? ?WEIGHT BEARING RESTRICTIONS Yes NWB ? ?PAIN:  ?Are you having pain? Yes: NPRS scale: 6/10 ?Pain location: left shoulder ?Pain description: sore ?Aggravating factors: grandchild laying on it ?Relieving factors: pain medication ? ?FALLS: Has patient fallen in last 6 months? No ? ?LIVING ENVIRONMENT: ?Lives with: lives with their family ?Lives in: House/apartment ?Stairs: Does not use ?Has following equipment at home: None ? ?PLOF: Independent ? ?PATIENT GOALS To be able to use LUE during functional tasks.  ? ?OBJECTIVE:  ? ?HAND DOMINANCE: Right ? ?ADLs: ?Overall ADLs: Pt with max difficulty using LUE for ADLs, has been removing sling and bending elbow, using hands for simple meal preparation assist. Sleeping is difficulty. She was able to do her daughters hair while bent over without moving arm to much or too high. Pt is unable to complete functional reaching tasks behind back and overhead. Unable to complete lifting tasks.  ? ? ?FUNCTIONAL OUTCOME MEASURES: ?FOTO: 30/100 ? ?UE ROM    ? ?Assessed supine, er/IR adducted ? ?Passive ROM Left ?08/02/2021  ?Shoulder flexion 77  ?Shoulder abduction 62  ?Shoulder internal rotation 90  ?Shoulder external rotation 26  ?(Blank rows =  not tested) ? ?Not assessed due to pain/precautions ? ?Active ROM Left ?08/02/2021  ?Shoulder flexion   ?Shoulder abduction   ?Shoulder internal rotation   ?Shoulder external rotation   ?(Blank rows = not tested) ? ? ?UE MMT:    ? ?Not assessed due to pain/precautions ? ?MMT Left ?08/02/2021  ?Shoulder flexion   ?Shoulder abduction   ?Shoulder internal rotation   ?Shoulder external rotation   ?(Blank rows = not tested) ? ? ?COGNITION: ?Overall cognitive status: Within functional limits for tasks assessed ? ? ?PATIENT EDUCATION: ?Education details: Table slides ?Person educated: Patient ?Education method: Explanation, Demonstration, and Handouts ?Education comprehension: verbalized understanding and returned demonstration ? ? ?HOME EXERCISE PROGRAM: ?4/17: Table slides ? ?GOALS: ?Goals reviewed with patient? Yes ? ?SHORT TERM GOALS: Target date: 08/30/2021 ? ?Pt will be provided with and educated on HEP to improve mobility of LUE required for use as dominant during ADL completion.  ? ?Goal status: INITIAL ? ?2.  Pt will increase LUE P/ROM to [facility] to improve ability to  complete dressing and bathing tasks with minimal compensatory strategies.  ? ?Goal status: INITIAL ? ?3.  Pt will increase LUE strength to 3/5 or greater to improve ability to reach for items at waist to chest height during ADLs such as meal preparation and eating.   ? ?Goal status: INITIAL ? ? ? ? ?LONG TERM GOALS: Target date: 09/27/2021 ? ?Pt will return to highest functional level using LUE as dominant during ADL and leisure tasks.  ? ?Goal status: INITIAL ? ?2.  Pt will decrease pain in LUE to 3/10 or less to improve ability to sleep for 3+ consecutive hours without waking due to pain.  ? ?Goal status: INITIAL ? ?3.  Pt will decrease LUE fascial restrictions to trace amounts to improve mobility required for functional reaching tasks.  ? ?Goal status: INITIAL ? ?4.  Pt will increase LUE A/ROM to [facility] to improve ability to reach overhead and behind back  when bathing and dressing.  ? ?Goal status: INITIAL ? ?5.  Pt will increase LUE strength to 4+/5 or greater to improve ability to perform lifting tasks required for caring great grandchildren.  ? ?Goal status:

## 2021-08-02 NOTE — Patient Instructions (Signed)
1) SHOULDER: Flexion On Table   Place hands on towel placed on table, elbows straight. Lean forward with you upper body, pushing towel away from body.  _10-15__ reps per set, __3_ sets per day  2) Abduction (Passive)   With arm out to side, resting on towel placed on table with palm DOWN, keeping trunk away from table, lean to the side while pushing towel away from body.  Repeat _10-15___ times. Do __3__ sessions per day.  Copyright  VHI. All rights reserved.     3) Internal Rotation (Assistive)   Seated with elbow bent at right angle and held against side, slide arm on table surface in an inward arc keeping elbow anchored in place. Repeat __10-15__ times. Do __3__ sessions per day. Activity: Use this motion to brush crumbs off the table.  Copyright  VHI. All rights reserved.   

## 2021-08-03 ENCOUNTER — Encounter (HOSPITAL_COMMUNITY): Payer: Self-pay | Admitting: Physical Therapy

## 2021-08-03 ENCOUNTER — Telehealth: Payer: Self-pay | Admitting: *Deleted

## 2021-08-03 ENCOUNTER — Ambulatory Visit (HOSPITAL_COMMUNITY): Payer: 59 | Admitting: Physical Therapy

## 2021-08-03 DIAGNOSIS — M542 Cervicalgia: Secondary | ICD-10-CM

## 2021-08-03 DIAGNOSIS — R2689 Other abnormalities of gait and mobility: Secondary | ICD-10-CM

## 2021-08-03 DIAGNOSIS — M25551 Pain in right hip: Secondary | ICD-10-CM

## 2021-08-03 DIAGNOSIS — R29898 Other symptoms and signs involving the musculoskeletal system: Secondary | ICD-10-CM

## 2021-08-03 DIAGNOSIS — M6281 Muscle weakness (generalized): Secondary | ICD-10-CM

## 2021-08-03 DIAGNOSIS — M545 Low back pain, unspecified: Secondary | ICD-10-CM

## 2021-08-03 NOTE — Telephone Encounter (Signed)
CALLED PATIENT TO ASK ABOUT RESCHEDULING FU APPT. ON 08-12-21 DUE TO DR. KINARD BEING ON VACATION, SPOKE WITH PATIENT AND SHE AGREED TO COME ON 08-19-21 @ 9:15 AM ?
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
## 2021-08-05 ENCOUNTER — Encounter (HOSPITAL_COMMUNITY): Payer: Self-pay | Admitting: Physical Therapy

## 2021-08-05 ENCOUNTER — Encounter (HOSPITAL_COMMUNITY): Payer: Self-pay

## 2021-08-05 ENCOUNTER — Ambulatory Visit (HOSPITAL_COMMUNITY): Payer: 59

## 2021-08-05 ENCOUNTER — Ambulatory Visit (HOSPITAL_COMMUNITY): Payer: 59 | Admitting: Physical Therapy

## 2021-08-05 DIAGNOSIS — R29898 Other symptoms and signs involving the musculoskeletal system: Secondary | ICD-10-CM

## 2021-08-05 DIAGNOSIS — M545 Low back pain, unspecified: Secondary | ICD-10-CM

## 2021-08-05 DIAGNOSIS — M25612 Stiffness of left shoulder, not elsewhere classified: Secondary | ICD-10-CM

## 2021-08-05 DIAGNOSIS — M25551 Pain in right hip: Secondary | ICD-10-CM | POA: Diagnosis not present

## 2021-08-05 DIAGNOSIS — M6281 Muscle weakness (generalized): Secondary | ICD-10-CM

## 2021-08-05 DIAGNOSIS — M542 Cervicalgia: Secondary | ICD-10-CM

## 2021-08-05 DIAGNOSIS — R2689 Other abnormalities of gait and mobility: Secondary | ICD-10-CM

## 2021-08-05 DIAGNOSIS — M25512 Pain in left shoulder: Secondary | ICD-10-CM

## 2021-08-05 NOTE — Therapy (Signed)
?OUTPATIENT OCCUPATIONAL THERAPY TREATMENT NOTE ? ? ?Patient Name: Hannah Cooper ?MRN: [id number] ?DOB:14-Aug-1955, 66 y.o., female ?Today's Date: 08/05/2021 ? ?PCP: Jake Samples, PA-C ?REFERRING PROVIDER: Carole Civil, MD ? ?END OF SESSION:  ? OT End of Session - 08/05/21 1335   ? ? Visit Number 2   ? Number of Visits 16   ? Date for OT Re-Evaluation 10/01/21   mini-reassessment 08/30/2021  ? Authorization Type 1) [employer], $20 copay 2) Generic [employer]   ? Authorization Time Period [employer]-60 visit limit combined (PT/OT/ST), 3 used on eval   ? Authorization - Visit Number 5   ? Authorization - Number of Visits 60   ? Progress Note Due on Visit 10   ? OT Start Time 1300   ? OT Stop Time 1338   ? OT Time Calculation (min) 38 min   ? Activity Tolerance Patient tolerated treatment well   ? Behavior During Therapy [facility] for tasks assessed/performed   ? ?  ?  ? ?  ? ? ?Past Medical History:  ?Diagnosis Date  ? Arthritis   ? Atypical chest pain   ? chronic  ? Cancer [facility])   ? bone cancer  ? Chronic back pain   ? Chronic diastolic CHF (congestive heart failure) ([place])   ? COPD (chronic obstructive pulmonary disease) ([place])   ? DDD (degenerative disc disease), cervical   ? Diabetes mellitus without complication ([place])   ? History of radiation therapy 07/16/19-08/22/19  ? L4 spine ; Dr. Gery Pray  ? Hyperlipidemia   ? Hypertension   ? Lumbar radiculopathy   ? Meningitis   ? Normal coronary arteries 2014  ? Palpitations   ? Premature atrial contractions   ? PVC's (premature ventricular contractions)   ? ?Past Surgical History:  ?Procedure Laterality Date  ? ABDOMINAL HYSTERECTOMY    ? BREAST LUMPECTOMY WITH RADIOFREQUENCY TAG IDENTIFICATION Right 01/25/2021  ? Procedure: BREAST LUMPECTOMY WITH RADIOFREQUENCY TAG IDENTIFICATION;  Surgeon: Virl Cagey, MD;  Location: [facility];  Service: General;  Laterality: Right;  ? EXCISION OF BREAST BIOPSY Right 01/25/2021  ? Procedure: EXCISION OF BREAST BIOPSY;   Surgeon: Virl Cagey, MD;  Location: [facility];  Service: General;  Laterality: Right;  ? IR FLUORO GUIDED NEEDLE PLC ASPIRATION/INJECTION LOC  06/19/2019  ? LEFT HEART CATHETERIZATION WITH CORONARY ANGIOGRAM N/A 09/05/2012  ? Procedure: LEFT HEART CATHETERIZATION WITH CORONARY ANGIOGRAM;  Surgeon: Wellington Hampshire, MD;  Location: [facility];  Service: Cardiovascular;  Laterality: N/A;  ? RESECTION DISTAL CLAVICAL Left 07/03/2020  ? Procedure: RESECTION DISTAL CLAVICAL;  Surgeon: Carole Civil, MD;  Location: [facility];  Service: Orthopedics;  Laterality: Left;  ? SHOULDER OPEN ROTATOR CUFF REPAIR Left 07/03/2020  ? Procedure: ROTATOR CUFF REPAIR SHOULDER OPEN WITH CHROMEOPLASTY;  Surgeon: Carole Civil, MD;  Location: [facility];  Service: Orthopedics;  Laterality: Left;  ? SHOULDER OPEN ROTATOR CUFF REPAIR Left 07/02/2021  ? Procedure: ROTATOR CUFF REPAIR SHOULDER OPEN with Patch Graft;  Surgeon: Carole Civil, MD;  Location: [facility];  Service: Orthopedics;  Laterality: Left;  ? THYROID SURGERY  2002  ? ?Patient Active Problem List  ? Diagnosis Date Noted  ? Traumatic incomplete tear of left rotator cuff   ? Intraductal papilloma of right breast 01/05/2021  ? S/p left rotator cuff repair distal clavicle resection 07/03/20  07/07/2020  ? Complete tear of left rotator cuff   ? 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  Plasma cell disorder 06/10/2019  ? Lesion of bone of lumbosacral spine 05/28/2019  ? Left-sided weakness 03/13/2016  ? Cerebrovascular accident (CVA) ([place])   ? Palpitations   ? COPD (chronic obstructive pulmonary disease) ([place]) 08/20/2014  ? Dyspnea 12/19/2013  ? Chronic diastolic CHF (congestive heart failure) ([place]) 07/05/2013  ? Lower extremity edema 07/05/2013  ? Hyperlipidemia   ? Chest pain 07/04/2013  ? Hypokalemia 09/04/2012  ? Precordial pain 09/04/2012  ? Viral meningitis 01/22/2011  ? PNEUMONIA, LEFT LOWER LOBE 10/10/2006  ? DISEASE, ACUTE BRONCHOSPASM 10/10/2006   ? Essential hypertension 05/23/2006  ? OSTEOARTHRITIS 05/23/2006  ? ? ?ONSET DATE: 07/02/21 ? ?REFERRING DIAG: s/p left RCR ? ?THERAPY DIAG:  ?Stiffness of left shoulder, not elsewhere classified ? ?Acute pain of left shoulder ? ?Other symptoms and signs involving the musculoskeletal system ? ? ?PERTINENT HISTORY: 07/02/2021 ? ?PRECAUTIONS: Shoulder;  ?Standard RCR protocol - from start of therapy date.  ?Sling on 4-6 weeks  ?0-6 weeks: PROM, pendulum (08/02/21-09/13/21) ?6-12 weeks: AAROM progressing to AROM  (09/13/21 - 09/25/21) ?12-24 weeks: Strengthening exercises  ? ?SUBJECTIVE: S: I didn't take anything for pain before coming.  ? ?PAIN:  ?Are you having pain? Yes: NPRS scale: 7/10 ?Pain location: left shoulder ?Pain description: achy, sore, constant ?Aggravating factors: nothing  ?Relieving factors: pain medication, ice ? ? ? ? ?OBJECTIVE:  ?  ?Assessed supine, er/IR adducted ?  ?Passive ROM Left ?08/02/2021  ?Shoulder flexion 77  ?Shoulder abduction 62  ?Shoulder internal rotation 90  ?Shoulder external rotation 26  ?(Blank rows = not tested) ?  ?Not assessed due to pain/precautions ?  ?Active ROM Left ?08/02/2021  ?Shoulder flexion    ?Shoulder abduction    ?Shoulder internal rotation    ?Shoulder external rotation    ?(Blank rows = not tested) ?  ?  ?UE MMT:    ?  ?Not assessed due to pain/precautions ?  ?MMT Left ?08/02/2021  ?Shoulder flexion    ?Shoulder abduction    ?Shoulder internal rotation    ?Shoulder external rotation    ?(Blank rows = not tested) ? ? ? PATIENT EDUCATION: ?Education details: reviewed therapy goals ?Person educated: patient ?Education method: verbal ?Education comprehension: verbalized understanding ?  ?  ?HOME EXERCISE PROGRAM: ?4/17: Table slides ?GOALS: ?Goals reviewed with patient? Yes ?  ?SHORT TERM GOALS: Target date: 08/30/2021 ?  ?Pt will be provided with and educated on HEP to improve mobility of LUE required for use as dominant during ADL completion.  ?  ?Goal status: on-going ?   ?2.  Pt will increase LUE P/ROM to [facility] to improve ability to complete dressing and bathing tasks with minimal compensatory strategies.  ?  ?Goal status: on-going ?  ?3.  Pt will increase LUE strength to 3/5 or greater to improve ability to reach for items at waist to chest height during ADLs such as meal preparation and eating.   ?  ?Goal status: on-going ?  ?  ?  ?  ?LONG TERM GOALS: Target date: 09/27/2021 ?  ?Pt will return to highest functional level using LUE as non-dominant during ADL and leisure tasks.  ?  ?Goal status: on-going ?  ?2.  Pt will decrease pain in LUE to 3/10 or less to improve ability to sleep for 3+ consecutive hours without waking due to pain.  ?  ?Goal status: on-going ?  ?3.  Pt will decrease LUE fascial restrictions to trace amounts to improve mobility required for functional reaching tasks.  ?  ?  Goal status: on-going ?  ?4.  Pt will increase LUE A/ROM to [facility] to improve ability to reach overhead and behind back when bathing and dressing.  ?  ?Goal status: on-going ?  ?5.  Pt will increase LUE strength to 4+/5 or greater to improve ability to perform lifting tasks required for caring great grandchildren.  ?  ?Goal status: on-going ?  ?TODAY'S TREATMENT: ?08/05/21 ?- Manual therapy: completed prior to exercises. Myofascial release and manual stretching completed to left upper arm, upper trapezius, and scapularis region. ?- P/ROM: shoulder, supine, flexion, IR/er, abduction 10X ?- A/ROM: scapular row, depression, extension, 10X ?- Therapy Ball: 1' flexion with 2" hold at end stretch. ? ? ?ASSESSMENT: ?  ?CLINICAL IMPRESSION: A:Initiated myofascial release to left upper arm, upper trapezius, and scapularis region to address fascial restrictions. Education provided regarding pain management techniques prior to therapy session and post therapy session. Modification recommendation provided for table slide abduction: stand versus sitting to see if able to tolerate. VC were provided for all  exercises for form and technique.  ? ? ?PLAN: ? ? ?OT FREQUENCY: 2x/week ? ?OT DURATION: 8 weeks ? ?PLANNED INTERVENTIONS: self care/ADL training, therapeutic exercise, therapeutic activity, neuromuscular re-ed
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
## 2021-08-10 ENCOUNTER — Ambulatory Visit (HOSPITAL_COMMUNITY): Payer: 59 | Admitting: Occupational Therapy

## 2021-08-10 ENCOUNTER — Encounter (HOSPITAL_COMMUNITY): Payer: Self-pay | Admitting: Occupational Therapy

## 2021-08-10 ENCOUNTER — Ambulatory Visit (INDEPENDENT_AMBULATORY_CARE_PROVIDER_SITE_OTHER): Payer: HMO | Admitting: Cardiology

## 2021-08-10 ENCOUNTER — Encounter (HOSPITAL_COMMUNITY): Payer: 59 | Admitting: Physical Therapy

## 2021-08-10 ENCOUNTER — Encounter: Payer: Self-pay | Admitting: Cardiology

## 2021-08-10 VITALS — BP 120/80 | HR 68 | Ht 65.0 in | Wt 227.8 lb

## 2021-08-10 DIAGNOSIS — I5032 Chronic diastolic (congestive) heart failure: Secondary | ICD-10-CM | POA: Diagnosis not present

## 2021-08-10 DIAGNOSIS — I10 Essential (primary) hypertension: Secondary | ICD-10-CM | POA: Diagnosis not present

## 2021-08-10 DIAGNOSIS — M25551 Pain in right hip: Secondary | ICD-10-CM | POA: Diagnosis not present

## 2021-08-10 DIAGNOSIS — R002 Palpitations: Secondary | ICD-10-CM

## 2021-08-10 DIAGNOSIS — M25512 Pain in left shoulder: Secondary | ICD-10-CM

## 2021-08-10 DIAGNOSIS — M25612 Stiffness of left shoulder, not elsewhere classified: Secondary | ICD-10-CM

## 2021-08-10 DIAGNOSIS — R29898 Other symptoms and signs involving the musculoskeletal system: Secondary | ICD-10-CM

## 2021-08-10 MED ORDER — METOPROLOL SUCCINATE ER 50 MG PO TB24: 50.0000 mg | ORAL_TABLET | Freq: Every day | ORAL | 3 refills | Status: DC

## 2021-08-10 MED ORDER — DILTIAZEM HCL 60 MG PO TABS: 60.0000 mg | ORAL_TABLET | Freq: Two times a day (BID) | ORAL | 3 refills | Status: DC

## 2021-08-10 NOTE — Patient Instructions (Signed)
Medication Instructions:  ?Your physician has recommended you make the following change in your medication:  ?Increase diltiazem to 60 mg twice a day ?Decrease metoprolol 50 mg once a day ?Continue all other medications as prescribed ? ?Labwork: ?none ? ?Testing/Procedures: ?none ? ?Follow-Up: ?Your physician recommends that you schedule a follow-up appointment in: 4 months ? ?Any Other Special Instructions Will Be Listed Below (If Applicable). ? ?If you need a refill on your cardiac medications before your next appointment, please call your pharmacy. ? ?

## 2021-08-10 NOTE — Progress Notes (Signed)
Clinical Summary Hannah Cooper is a 66 y.o.female seen today for follow up of the following medical problems.      1. Palpitations - 2018 monitor PAC and PVCs - has been on diltiazem and lopressor - some ongoing palpitatoins at times - no caffeine, no EtOH. Has had some recent stress due to loss of family members including recently her father.    - occasional palpitations, 2-3 times per week for short time.       2. HTN - remains compliant with meds   3. Chest pain - long history of atypical chest pain - cath 2014 with patent coronaries. -  admit 12/2014 with chest pain, negative evaluation for ACS.     - chronic symptoms unchanged since last visit  - 06/2020 nuclear stresss no clear ischemia.    4. COPD - noted on 12/2013 PFTs - followed Dr Luan Pulling          6. Chronic diastolic heart failure - echo 06/2013 LVEF 55-60%, abnormal diastolic function.     - no recent edema. Takes lasix prn, about 1-2 times per week. She is on daily chorlthalidone for bp   6. Dyspnea - had CPX 06/2014, showed mild to mod reduced functoinal capacity       7. IgG kappa monoclonal gammopathy and bony lesion - both followed by heme/onc   8. Hypokalemia - she reports addressed by pcp with recent normal labs    9. Fatigue - no issues sleeping throught night -    SH: works at [employer] for kids. She has 3 adopted kids (9,10,11). Great grand baby is 27 year old   Father just passed away 11/26/2020   Past Medical History:  Diagnosis Date   Arthritis    Atypical chest pain    chronic   Cancer (HCC)    bone cancer   Chronic back pain    Chronic diastolic CHF (congestive heart failure) (HCC)    COPD (chronic obstructive pulmonary disease) (HCC)    DDD (degenerative disc disease), cervical    Diabetes mellitus without complication ([place])    History of radiation therapy 07/16/19-08/22/19   L4 spine ; Dr. Gery Pray   Hyperlipidemia    Hypertension    Lumbar radiculopathy     Meningitis    Normal coronary arteries 2014   Palpitations    Premature atrial contractions    PVC's (premature ventricular contractions)      Allergies  Allergen Reactions   Other Swelling    Avon lipstick     Current Outpatient Medications  Medication Sig Dispense Refill   albuterol (PROVENTIL) (2.5 MG/3ML) 0.083% nebulizer solution Take 2.5 mg by nebulization every 6 (six) hours as needed for wheezing or shortness of breath.     albuterol (VENTOLIN HFA) 108 (90 Base) MCG/ACT inhaler Inhale 1-2 puffs into the lungs every 4 (four) hours as needed for wheezing or shortness of breath. 18 g 0   Apoaequorin (PREVAGEN) 10 MG CAPS Take 10 mg by mouth daily.     cetirizine (ZYRTEC) 10 MG tablet Take 10 mg by mouth daily.     chlorthalidone (HYGROTON) 50 MG tablet Take 1 tablet by mouth once daily 90 tablet 1   Cholecalciferol (DIALYVITE VITAMIN D 5000) 125 MCG (5000 UT) capsule Take 5,000 Units by mouth daily.     diltiazem (CARDIZEM) 30 MG tablet Take 1 tablet (30 mg total) by mouth 2 (two) times daily. (MAY TAKE AN ADDITIONAL TAB AS NEEDED FOR  PALPITATIONS) 270 tablet 3   furosemide (LASIX) 20 MG tablet Take 1 tablet daily as needed may take 2 tablets prn (Patient taking differently: Take 20-40 mg by mouth daily as needed for edema.) 60 tablet 1   glipiZIDE-metformin (METAGLIP) 5-500 MG tablet Take 1 tablet by mouth in the morning and at bedtime.     HYDROcodone-acetaminophen (NORCO) 10-325 MG tablet Take 1 tablet by mouth every 6 (six) hours as needed. 30 tablet 0   ibuprofen (ADVIL) 800 MG tablet Take 1 tablet (800 mg total) by mouth every 8 (eight) hours as needed. 90 tablet 1   Insulin Glargine (BASAGLAR KWIKPEN) 100 UNIT/ML Inject 15 Units into the skin at bedtime as needed (blood sugar over 150).     isosorbide mononitrate (IMDUR) 30 MG 24 hr tablet TAKE 1 TABLET (30 MG TOTAL) BY MOUTH DAILY. 90 tablet 0   lidocaine (LIDODERM) 5 % Place 2 patches onto the skin daily. Remove &  Discard patch within 12 hours or as directed by MD (Patient taking differently: Place 2 patches onto the skin daily as needed (pain). Remove & Discard patch within 12 hours or as directed by MD) 60 patch 1   losartan (COZAAR) 100 MG tablet Take 100 mg by mouth daily.     metoprolol tartrate (LOPRESSOR) 100 MG tablet Take 100 mg by mouth 2 (two) times daily.     Multiple Vitamin (MULTIVITAMIN WITH MINERALS) TABS tablet Take 1 tablet by mouth daily.     nitroGLYCERIN (NITROSTAT) 0.4 MG SL tablet Place 0.4 mg under the tongue every 5 (five) minutes as needed for chest pain.     oxyCODONE (ROXICODONE) 5 MG immediate release tablet Take 1 tablet (5 mg total) by mouth daily as needed for severe pain or breakthrough pain. 30 tablet 0   RELION PEN NEEDLES 32G X 4 MM MISC      tiZANidine (ZANAFLEX) 4 MG tablet Take 1 tablet (4 mg total) by mouth daily. 30 tablet 1   TURMERIC PO Take 600 mg by mouth daily.     No current facility-administered medications for this visit.     Past Surgical History:  Procedure Laterality Date   ABDOMINAL HYSTERECTOMY     BREAST LUMPECTOMY WITH RADIOFREQUENCY TAG IDENTIFICATION Right 01/25/2021   Procedure: BREAST LUMPECTOMY WITH RADIOFREQUENCY TAG IDENTIFICATION;  Surgeon: Virl Cagey, MD;  Location: [facility];  Service: General;  Laterality: Right;   EXCISION OF BREAST BIOPSY Right 01/25/2021   Procedure: EXCISION OF BREAST BIOPSY;  Surgeon: Virl Cagey, MD;  Location: [facility];  Service: General;  Laterality: Right;   IR FLUORO GUIDED NEEDLE PLC ASPIRATION/INJECTION LOC  06/19/2019   LEFT HEART CATHETERIZATION WITH CORONARY ANGIOGRAM N/A 09/05/2012   Procedure: LEFT HEART CATHETERIZATION WITH CORONARY ANGIOGRAM;  Surgeon: Wellington Hampshire, MD;  Location: [facility];  Service: Cardiovascular;  Laterality: N/A;   RESECTION DISTAL CLAVICAL Left 07/03/2020   Procedure: RESECTION DISTAL CLAVICAL;  Surgeon: Carole Civil, MD;  Location: [facility];  Service:  Orthopedics;  Laterality: Left;   SHOULDER OPEN ROTATOR CUFF REPAIR Left 07/03/2020   Procedure: ROTATOR CUFF REPAIR SHOULDER OPEN WITH CHROMEOPLASTY;  Surgeon: Carole Civil, MD;  Location: [facility];  Service: Orthopedics;  Laterality: Left;   SHOULDER OPEN ROTATOR CUFF REPAIR Left 07/02/2021   Procedure: ROTATOR CUFF REPAIR SHOULDER OPEN with Patch Graft;  Surgeon: Carole Civil, MD;  Location: [facility];  Service: Orthopedics;  Laterality: Left;   THYROID SURGERY  2002  Allergies  Allergen Reactions   Other Swelling    Avon lipstick      Family History  Problem Relation Age of Onset   Heart attack Mother 58   Heart attack Sister 72   Breast cancer Maternal Grandmother    Breast cancer Cousin      Social History Ms. Fuelling reports that she has never smoked. She has never used smokeless tobacco. Ms. Appleton reports no history of alcohol use.   Review of Systems CONSTITUTIONAL: No weight loss, fever, chills, weakness or fatigue.  HEENT: Eyes: No visual loss, blurred vision, double vision or yellow sclerae.No hearing loss, sneezing, congestion, runny nose or sore throat.  SKIN: No rash or itching.  CARDIOVASCULAR: per hpi RESPIRATORY: per hpi GASTROINTESTINAL: No anorexia, nausea, vomiting or diarrhea. No abdominal pain or blood.  GENITOURINARY: No burning on urination, no polyuria NEUROLOGICAL: No headache, dizziness, syncope, paralysis, ataxia, numbness or tingling in the extremities. No change in bowel or bladder control.  MUSCULOSKELETAL: No muscle, back pain, joint pain or stiffness.  LYMPHATICS: No enlarged nodes. No history of splenectomy.  PSYCHIATRIC: No history of depression or anxiety.  ENDOCRINOLOGIC: No reports of sweating, cold or heat intolerance. No polyuria or polydipsia.  Marland Kitchen   Physical Examination Today's Vitals   08/10/21 1120  BP: 120/80  Pulse: 68  Weight: 227 lb 12.8 oz (103.3 kg)  Height: '5\' 5"'$  (1.651 m)   Body mass index is 37.91  kg/m.  Gen: resting comfortably, no acute distress HEENT: no scleral icterus, pupils equal round and reactive, no palptable cervical adenopathy,  CV: RRR, no m/r/g, no jvd Resp: Clear to auscultation bilaterally GI: abdomen is soft, non-tender, non-distended, normal bowel sounds, no hepatosplenomegaly MSK: extremities are warm, no edema.  Skin: warm, no rash Neuro:  no focal deficits Psych: appropriate affect   Diagnostic Studies  08/2012 cath Hemodynamics: AO:  164/82   mmHg LV:  169/14    mmHg LVEDP: 24  mmHg   Coronary angiography: Coronary dominance: Right    Left Main:  Normal Left Anterior Descending (LAD):  Normal in size with no significant disease. 1st diagonal (D1):  Small in size with minor irregularities. 2nd diagonal (D2):  Normal in size with no significant disease. 3rd diagonal (D3):  Normal in size with no significant disease. Circumflex (LCx):  Normal in size and nondominant. The vessel has no significant disease. 1st obtuse marginal:  Small in size with no significant disease. 2nd obtuse marginal:  Small in size with no significant disease.  3rd obtuse marginal:  Large in size with no significant disease.   Right Coronary Artery: Normal in size and dominant. The vessel has no significant disease. posterior descending artery: Normal in size with no significant disease. posterior lateral branchs:  Normal in size with no significant disease.   Left ventriculography: Left ventricular systolic function is normal , LVEF is estimated at 60-65% %, there is no significant mitral regurgitation    Final Conclusions:    1. Normal coronary arteries. 2. Normal LV systolic function. 3. Moderately elevated left ventricular end-diastolic pressure likely due to diastolic heart failure.     06/2013 echo Study Conclusions  - Procedure narrative: Transthoracic echocardiography. Image   quality was suboptimal. The study was technically   difficult, as a result of poor  sound wave transmission and   restricted patient mobility. - Left ventricle: The cavity size was normal. Wall thickness   was increased in a pattern of mild to moderate LVH.   Diastolic  dysfunction noted, grade indeterminant. Systolic   function was normal. The estimated ejection fraction was   in the range of 55% to 60%. Wall motion was normal; there   were no regional wall motion abnormalities.     Jan 2017 Event monitor: no arrhythmias   06/2014 CPX Conclusion: Exercise testing with gas exchange demonstrates a mild-moderately reduced functional capacity when compared to matched sedentary norms. There was an in-adequate HR response to the exercise response and flat O2 pulse. This high HR reserve was likely high due to pulmonary limitations based on restrictive spirometry and RR 45 at peak exercise and reaching maximum ventilatory limits. However, it could have easily been due to poor effort. The sub-maximal effort is clouding interpretation of the test. Nevertheless, correlation of resting spirometry test  Showing restriction with outpatient pulmonary function test is warranted     05/2016 heart monitor Telemetry tracings predominately show sinus rhythm Reported symptoms correlate with occasional PVCs and PACs       02/2016 echo Study Conclusions   - Left ventricle: The cavity size was normal. Systolic function was   vigorous. The estimated ejection fraction was in the range of 65%   to 70%. Wall motion was normal; there were no regional wall   motion abnormalities. There was an increased relative   contribution of atrial contraction to ventricular filling.   Doppler parameters are consistent with abnormal left ventricular   relaxation (grade 1 diastolic dysfunction). - Pulmonic valve: There was trivial regurgitation. - Pulmonary arteries: Systolic pressure could not be accurately   estimated.   Assessment and Plan  1. Palpitations - some recent palpitations. Some  fatigue,unclear if related to beta blocker. Will increase dilt to '60mg'$  bid and low toprol to '50mg'$  daily      2.HTN - she is at goal, continue current meds   3. Chronic diastolic HF - no symptoms, continue current meds  F/u 6 months     Arnoldo Lenis, M.D.

## 2021-08-10 NOTE — Therapy (Signed)
?OUTPATIENT OCCUPATIONAL THERAPY TREATMENT NOTE ? ? ?Patient Name: Hannah Cooper ?MRN: [id number] ?DOB:[birth year], 66 y.o., female ?Today's Date: 08/10/2021 ? ?PCP: Jake Samples, PA-C ?REFERRING PROVIDER: Carole Civil, MD ? ?END OF SESSION:  ? OT End of Session - 08/10/21 1518   ? ? Visit Number 3   ? Number of Visits 16   ? Date for OT Re-Evaluation 10/01/21   mini-reassessment 08/30/2021  ? Authorization Type 1) [employer], $20 copay 2) Generic [employer]   ? Authorization Time Period [employer]-60 visit limit combined (PT/OT/ST), 3 used on eval   ? Authorization - Visit Number 6   ? Authorization - Number of Visits 60   ? Progress Note Due on Visit 10   ? OT Start Time 1346   ? OT Stop Time 5885   Pt requested to leave early due to need to pick up relatives.  ? OT Time Calculation (min) 34 min   ? Activity Tolerance Patient tolerated treatment well   ? Behavior During Therapy [facility] for tasks assessed/performed   ? ?  ?  ? ?  ? ? ? ?Past Medical History:  ?Diagnosis Date  ? Arthritis   ? Atypical chest pain   ? chronic  ? Cancer [facility])   ? bone cancer  ? Chronic back pain   ? Chronic diastolic CHF (congestive heart failure) ([place])   ? COPD (chronic obstructive pulmonary disease) ([place])   ? DDD (degenerative disc disease), cervical   ? Diabetes mellitus without complication ([place])   ? History of radiation therapy 07/16/19-08/22/19  ? L4 spine ; Dr. Gery Pray  ? Hyperlipidemia   ? Hypertension   ? Lumbar radiculopathy   ? Meningitis   ? Normal coronary arteries 2014  ? Palpitations   ? Premature atrial contractions   ? PVC's (premature ventricular contractions)   ? ?Past Surgical History:  ?Procedure Laterality Date  ? ABDOMINAL HYSTERECTOMY    ? BREAST LUMPECTOMY WITH RADIOFREQUENCY TAG IDENTIFICATION Right 01/25/2021  ? Procedure: BREAST LUMPECTOMY WITH RADIOFREQUENCY TAG IDENTIFICATION;  Surgeon: Virl Cagey, MD;  Location: [facility];  Service: General;  Laterality: Right;  ? EXCISION OF BREAST  BIOPSY Right 01/25/2021  ? Procedure: EXCISION OF BREAST BIOPSY;  Surgeon: Virl Cagey, MD;  Location: [facility];  Service: General;  Laterality: Right;  ? IR FLUORO GUIDED NEEDLE PLC ASPIRATION/INJECTION LOC  06/19/2019  ? LEFT HEART CATHETERIZATION WITH CORONARY ANGIOGRAM N/A 09/05/2012  ? Procedure: LEFT HEART CATHETERIZATION WITH CORONARY ANGIOGRAM;  Surgeon: Wellington Hampshire, MD;  Location: [facility];  Service: Cardiovascular;  Laterality: N/A;  ? RESECTION DISTAL CLAVICAL Left 07/03/2020  ? Procedure: RESECTION DISTAL CLAVICAL;  Surgeon: Carole Civil, MD;  Location: [facility];  Service: Orthopedics;  Laterality: Left;  ? SHOULDER OPEN ROTATOR CUFF REPAIR Left 07/03/2020  ? Procedure: ROTATOR CUFF REPAIR SHOULDER OPEN WITH CHROMEOPLASTY;  Surgeon: Carole Civil, MD;  Location: [facility];  Service: Orthopedics;  Laterality: Left;  ? SHOULDER OPEN ROTATOR CUFF REPAIR Left 07/02/2021  ? Procedure: ROTATOR CUFF REPAIR SHOULDER OPEN with Patch Graft;  Surgeon: Carole Civil, MD;  Location: [facility];  Service: Orthopedics;  Laterality: Left;  ? THYROID SURGERY  2002  ? ?Patient Active Problem List  ? Diagnosis Date Noted  ? Traumatic incomplete tear of left rotator cuff   ? Intraductal papilloma of right breast 01/05/2021  ? S/p left rotator cuff repair distal clavicle resection 07/03/20  07/07/2020  ? Complete tear of left  rotator cuff   ? 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 Plasma cell disorder 06/10/2019  ? Lesion of bone of lumbosacral spine 05/28/2019  ? Left-sided weakness 03/13/2016  ? Cerebrovascular accident (CVA) ([place])   ? Palpitations   ? COPD (chronic obstructive pulmonary disease) ([place]) 08/20/2014  ? Dyspnea 12/19/2013  ? Chronic diastolic CHF (congestive heart failure) ([place]) 07/05/2013  ? Lower extremity edema 07/05/2013  ? Hyperlipidemia   ? Chest pain 07/04/2013  ? Hypokalemia 09/04/2012  ? Precordial pain 09/04/2012  ? Viral meningitis 01/22/2011  ? PNEUMONIA,  LEFT LOWER LOBE 10/10/2006  ? DISEASE, ACUTE BRONCHOSPASM 10/10/2006  ? Essential hypertension 05/23/2006  ? OSTEOARTHRITIS 05/23/2006  ? ? ?ONSET DATE: 07/02/21 ? ?REFERRING DIAG: s/p left RCR ? ?THERAPY DIAG:  ?Stiffness of left shoulder, not elsewhere classified ? ?Acute pain of left shoulder ? ?Other symptoms and signs involving the musculoskeletal system ? ? ?PERTINENT HISTORY: 07/02/2021 ? ?PRECAUTIONS: Shoulder;  ?Standard RCR protocol - from start of therapy date.  ?Sling on 4-6 weeks  ?0-6 weeks: PROM, pendulum (08/02/21-09/13/21) ?6-12 weeks: AAROM progressing to AROM  (09/13/21 - 09/25/21) ?12-24 weeks: Strengthening exercises  ? ?SUBJECTIVE: S: Right now I feel like I have pins jabbing in it.  ? ?PAIN:  ?Are you having pain? Yes: NPRS scale: 7/10 ?Pain location: left shoulder ?Pain description: achy, sore, constant ?Aggravating factors: nothing  ?Relieving factors: pain medication, ice ? ? ? ? ?OBJECTIVE:  ?  ?Assessed supine, er/IR adducted ?  ?Passive ROM Left ?08/02/2021  ?Shoulder flexion 77  ?Shoulder abduction 62  ?Shoulder internal rotation 90  ?Shoulder external rotation 26  ?(Blank rows = not tested) ?  ?Not assessed due to pain/precautions ?  ?Active ROM Left ?08/02/2021  ?Shoulder flexion    ?Shoulder abduction    ?Shoulder internal rotation    ?Shoulder external rotation    ?(Blank rows = not tested) ?  ?  ?UE MMT:    ?  ?Not assessed due to pain/precautions ?  ?MMT Left ?08/02/2021  ?Shoulder flexion    ?Shoulder abduction    ?Shoulder internal rotation    ?Shoulder external rotation    ?(Blank rows = not tested) ? ? ? PATIENT EDUCATION: ?Education details: reviewed therapy goals ?Person educated: patient ?Education method: verbal ?Education comprehension: verbalized understanding ?  ?  ?HOME EXERCISE PROGRAM: ?4/17: Table slides ?GOALS: ?Goals reviewed with patient? Yes ?  ?SHORT TERM GOALS: Target date: 08/30/2021 ?  ?Pt will be provided with and educated on HEP to improve mobility of LUE required  for use as dominant during ADL completion.  ?  ?Goal status: on-going ?  ?2.  Pt will increase LUE P/ROM to [facility] to improve ability to complete dressing and bathing tasks with minimal compensatory strategies.  ?  ?Goal status: on-going ?  ?3.  Pt will increase LUE strength to 3/5 or greater to improve ability to reach for items at waist to chest height during ADLs such as meal preparation and eating.   ?  ?Goal status: on-going ?  ?  ?  ?  ?LONG TERM GOALS: Target date: 09/27/2021 ?  ?Pt will return to highest functional level using LUE as non-dominant during ADL and leisure tasks.  ?  ?Goal status: on-going ?  ?2.  Pt will decrease pain in LUE to 3/10 or less to improve ability to sleep for 3+ consecutive hours without waking due to pain.  ?  ?Goal status: on-going ?  ?3.  Pt will decrease  LUE fascial restrictions to trace amounts to improve mobility required for functional reaching tasks.  ?  ?Goal status: on-going ?  ?4.  Pt will increase LUE A/ROM to [facility] to improve ability to reach overhead and behind back when bathing and dressing.  ?  ?Goal status: on-going ?  ?5.  Pt will increase LUE strength to 4+/5 or greater to improve ability to perform lifting tasks required for caring great grandchildren.  ?  ?Goal status: on-going ?  ?TODAY'S TREATMENT: ? 08/10/21 ?- Manual therapy: completed prior to exercises. Myofascial release and manual stretching completed to left upper arm, upper trapezius, and scapularis region. ?- P/ROM: shoulder, supine, flexion, IR/er, abduction, horizontal abduction 10X ?  ?08/05/21 ?- Manual therapy: completed prior to exercises. Myofascial release and manual stretching completed to left upper arm, upper trapezius, and scapularis region. ?- P/ROM: shoulder, supine, flexion, IR/er, abduction 10X ?- A/ROM: scapular row, depression, extension, 10X ?- Therapy Ball: 1' flexion with 2" hold at end stretch. ? ? ?ASSESSMENT: ?  ?CLINICAL IMPRESSION: A:Continued myofascial release to left upper arm,  upper trapezius, and scapularis region to address fascial restrictions. P/ROM in supine followed with frequent verbal cuing to relax L UE. Pt encouraged to continue table slides and ask doctor about discontin

## 2021-08-11 ENCOUNTER — Encounter (HOSPITAL_COMMUNITY): Payer: Self-pay

## 2021-08-11 ENCOUNTER — Ambulatory Visit (HOSPITAL_COMMUNITY): Payer: 59

## 2021-08-11 DIAGNOSIS — M25512 Pain in left shoulder: Secondary | ICD-10-CM

## 2021-08-11 DIAGNOSIS — M25612 Stiffness of left shoulder, not elsewhere classified: Secondary | ICD-10-CM

## 2021-08-11 DIAGNOSIS — R29898 Other symptoms and signs involving the musculoskeletal system: Secondary | ICD-10-CM

## 2021-08-11 DIAGNOSIS — M25551 Pain in right hip: Secondary | ICD-10-CM | POA: Diagnosis not present

## 2021-08-11 NOTE — Patient Instructions (Signed)
Scar/Incision Desensitization ? ?Use cotton ball/tissue to brush lightly over scar/incision for desensitization. Set timer for 1-2 minutes. Complete 2-3 times a day or more.  ? ? ?

## 2021-08-11 NOTE — Therapy (Signed)
?OUTPATIENT OCCUPATIONAL THERAPY TREATMENT NOTE ? ? ?Patient Name: Hannah Cooper ?MRN: [id number] ?DOB:1955/06/20, 66 y.o., female ?Today's Date: 08/11/2021 ? ?PCP: Jake Samples, PA-C ?REFERRING PROVIDER: Carole Civil, MD ? ?Assessed supine, er/IR adducted ?  ?Passive ROM Left ?08/02/2021 Left  ?08/11/21  ?Shoulder flexion 77 82  ?Shoulder abduction 62 70  ?Shoulder internal rotation 90 90  ?Shoulder external rotation 26 40  ?(Blank rows = not tested) ?  ?Not assessed due to pain/precautions ?  ?Active ROM Left ?08/02/2021  ?Shoulder flexion    ?Shoulder abduction    ?Shoulder internal rotation    ?Shoulder external rotation    ?(Blank rows = not tested) ?  ?  ?UE MMT:    ?  ?Not assessed due to pain/precautions ?  ?MMT Left ?08/02/2021  ?Shoulder flexion    ?Shoulder abduction    ?Shoulder internal rotation    ?Shoulder external rotation    ?(Blank rows = not tested) ?END OF SESSION:  ? OT End of Session - 08/11/21 1348   ? ? Visit Number 4   ? Number of Visits 16   ? Date for OT Re-Evaluation 10/01/21   mini-reassessment 08/30/2021  ? Authorization Type 1) [employer], $20 copay 2) Generic [employer]   ? Authorization Time Period [employer]-60 visit limit combined (PT/OT/ST), 3 used on eval   ? Authorization - Visit Number 7   ? Authorization - Number of Visits 60   ? Progress Note Due on Visit 10   ? OT Start Time 1300   ? OT Stop Time 1338   ? OT Time Calculation (min) 38 min   ? Activity Tolerance Patient tolerated treatment well   ? Behavior During Therapy [facility] for tasks assessed/performed   ? ?  ?  ? ?  ? ? ? ? ?Past Medical History:  ?Diagnosis Date  ? Arthritis   ? Atypical chest pain   ? chronic  ? Cancer [facility])   ? bone cancer  ? Chronic back pain   ? Chronic diastolic CHF (congestive heart failure) ([place])   ? COPD (chronic obstructive pulmonary disease) ([place])   ? DDD (degenerative disc disease), cervical   ? Diabetes mellitus without complication ([place])   ? History of radiation therapy  07/16/19-08/22/19  ? L4 spine ; Dr. Gery Pray  ? Hyperlipidemia   ? Hypertension   ? Lumbar radiculopathy   ? Meningitis   ? Normal coronary arteries 2014  ? Palpitations   ? Premature atrial contractions   ? PVC's (premature ventricular contractions)   ? ?Past Surgical History:  ?Procedure Laterality Date  ? ABDOMINAL HYSTERECTOMY    ? BREAST LUMPECTOMY WITH RADIOFREQUENCY TAG IDENTIFICATION Right 01/25/2021  ? Procedure: BREAST LUMPECTOMY WITH RADIOFREQUENCY TAG IDENTIFICATION;  Surgeon: Virl Cagey, MD;  Location: [facility];  Service: General;  Laterality: Right;  ? EXCISION OF BREAST BIOPSY Right 01/25/2021  ? Procedure: EXCISION OF BREAST BIOPSY;  Surgeon: Virl Cagey, MD;  Location: [facility];  Service: General;  Laterality: Right;  ? IR FLUORO GUIDED NEEDLE PLC ASPIRATION/INJECTION LOC  06/19/2019  ? LEFT HEART CATHETERIZATION WITH CORONARY ANGIOGRAM N/A 09/05/2012  ? Procedure: LEFT HEART CATHETERIZATION WITH CORONARY ANGIOGRAM;  Surgeon: Wellington Hampshire, MD;  Location: [place] CATH LAB;  Service: Cardiovascular;  Laterality: N/A;  ? RESECTION DISTAL CLAVICAL Left 07/03/2020  ? Procedure: RESECTION DISTAL CLAVICAL;  Surgeon: Carole Civil, MD;  Location: [facility];  Service: Orthopedics;  Laterality: Left;  ? SHOULDER OPEN ROTATOR CUFF  REPAIR Left 07/03/2020  ? Procedure: ROTATOR CUFF REPAIR SHOULDER OPEN WITH CHROMEOPLASTY;  Surgeon: Carole Civil, MD;  Location: [facility];  Service: Orthopedics;  Laterality: Left;  ? SHOULDER OPEN ROTATOR CUFF REPAIR Left 07/02/2021  ? Procedure: ROTATOR CUFF REPAIR SHOULDER OPEN with Patch Graft;  Surgeon: Carole Civil, MD;  Location: [facility];  Service: Orthopedics;  Laterality: Left;  ? THYROID SURGERY  2002  ? ?Patient Active Problem List  ? Diagnosis Date Noted  ? Traumatic incomplete tear of left rotator cuff   ? Intraductal papilloma of right breast 01/05/2021  ? S/p left rotator cuff repair distal clavicle resection 07/03/20  07/07/2020  ? Complete tear  of left rotator cuff   ? 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[place]) 07/10/2019  ? Plasma cell disorder 06/10/2019  ? Lesion of bone of lumbosacral spine 05/28/2019  ? Left-sided weakness 03/13/2016  ? Cerebrovascular accident (CVA) ([place])   ? Palpitations   ? COPD (chronic obstructive pulmonary disease) ([place]) 08/20/2014  ? Dyspnea 12/19/2013  ? Chronic diastolic CHF (congestive heart failure) ([place]) 07/05/2013  ? Lower extremity edema 07/05/2013  ? Hyperlipidemia   ? Chest pain 07/04/2013  ? Hypokalemia 09/04/2012  ? Precordial pain 09/04/2012  ? Viral meningitis 01/22/2011  ? PNEUMONIA, LEFT LOWER LOBE 10/10/2006  ? DISEASE, ACUTE BRONCHOSPASM 10/10/2006  ? Essential hypertension 05/23/2006  ? OSTEOARTHRITIS 05/23/2006  ? ? ?ONSET DATE: 07/02/21 ? ?REFERRING DIAG: s/p left RCR ? ?THERAPY DIAG:  ?Acute pain of left shoulder ? ?Other symptoms and signs involving the musculoskeletal system ? ?Stiffness of left shoulder, not elsewhere classified ? ? ?PERTINENT HISTORY: 07/02/2021 ? ?PRECAUTIONS: Shoulder;  ?Standard RCR protocol - from start of therapy date.  ?Sling on 4-6 weeks  ?0-6 weeks: PROM, pendulum (08/02/21-09/13/21) ?6-12 weeks: AAROM progressing to AROM  (09/13/21 - 09/25/21) ?12-24 weeks: Strengthening exercises  ? ?SUBJECTIVE: S: Right now I feel like I have pins jabbing in it.  ? ?PAIN:  ?Are you having pain? Yes: NPRS scale: 7/10 ?Pain location: left shoulder ?Pain description: achy, sore, constant ?Aggravating factors: nothing  ?Relieving factors: pain medication, ice ? ? ? ? ?OBJECTIVE:  ?  ?Assessed supine, er/IR adducted ?  ?Passive ROM Left ?08/02/2021 Left  ?08/11/21  ?Shoulder flexion 77 82  ?Shoulder abduction 62 70  ?Shoulder internal rotation 90 90  ?Shoulder external rotation 26 40  ?(Blank rows = not tested) ?  ?Not assessed due to pain/precautions ?  ?Active ROM Left ?08/02/2021  ?Shoulder flexion    ?Shoulder abduction    ?Shoulder internal rotation    ?Shoulder external rotation    ?(Blank rows  = not tested) ?  ?  ?UE MMT:    ?  ?Not assessed due to pain/precautions ?  ?MMT Left ?08/02/2021  ?Shoulder flexion    ?Shoulder abduction    ?Shoulder internal rotation    ?Shoulder external rotation    ?(Blank rows = not tested) ? ? ? PATIENT EDUCATION: ?Education details: reviewed therapy goals ?Person educated: patient ?Education method: verbal ?Education comprehension: verbalized understanding ?  ?  ?HOME EXERCISE PROGRAM: ?4/17: Table slides ?GOALS: ?Goals reviewed with patient? Yes ?  ?SHORT TERM GOALS: Target date: 08/30/2021 ?  ?Pt will be provided with and educated on HEP to improve mobility of LUE required for use as dominant during ADL completion.  ?  ?Goal status: on-going ?  ?2.  Pt will increase LUE P/ROM to [facility] to improve ability to complete dressing and bathing tasks with minimal compensatory  strategies.  ?  ?Goal status: on-going ?  ?3.  Pt will increase LUE strength to 3/5 or greater to improve ability to reach for items at waist to chest height during ADLs such as meal preparation and eating.   ?  ?Goal status: on-going ?  ?  ?  ?  ?LONG TERM GOALS: Target date: 09/27/2021 ?  ?Pt will return to highest functional level using LUE as non-dominant during ADL and leisure tasks.  ?  ?Goal status: on-going ?  ?2.  Pt will decrease pain in LUE to 3/10 or less to improve ability to sleep for 3+ consecutive hours without waking due to pain.  ?  ?Goal status: on-going ?  ?3.  Pt will decrease LUE fascial restrictions to trace amounts to improve mobility required for functional reaching tasks.  ?  ?Goal status: on-going ?  ?4.  Pt will increase LUE A/ROM to [facility] to improve ability to reach overhead and behind back when bathing and dressing.  ?  ?Goal status: on-going ?  ?5.  Pt will increase LUE strength to 4+/5 or greater to improve ability to perform lifting tasks required for caring great grandchildren.  ?  ?Goal status: on-going ?  ?TODAY'S TREATMENT: ? 08/11/21  ?- Manual therapy: completed prior to  exercises. Myofascial release and manual stretching completed to left upper arm, upper trapezius, and scapularis region. ?- P/ROM: shoulder, supine, flexion, IR/er, abduction, horizontal abduction 10X ?- Therapy ball:

## 2021-08-12 ENCOUNTER — Encounter (HOSPITAL_COMMUNITY): Payer: 59

## 2021-08-12 ENCOUNTER — Ambulatory Visit (INDEPENDENT_AMBULATORY_CARE_PROVIDER_SITE_OTHER): Payer: HMO | Admitting: Orthopedic Surgery

## 2021-08-12 ENCOUNTER — Ambulatory Visit: Payer: Self-pay | Admitting: Radiation Oncology

## 2021-08-12 VITALS — Ht 65.0 in | Wt 227.0 lb

## 2021-08-12 DIAGNOSIS — Z9889 Other specified postprocedural states: Secondary | ICD-10-CM

## 2021-08-12 NOTE — Patient Instructions (Addendum)
Follow up in 5 weeks s/p LEFT RCR ?You may take your sling off now ?Dr. Aline Brochure will send your medication in ? ?If you need [place] call us!!! ?

## 2021-08-15 DIAGNOSIS — E118 Type 2 diabetes mellitus with unspecified complications: Secondary | ICD-10-CM | POA: Diagnosis not present

## 2021-08-15 DIAGNOSIS — E782 Mixed hyperlipidemia: Secondary | ICD-10-CM | POA: Diagnosis not present

## 2021-08-15 DIAGNOSIS — I10 Essential (primary) hypertension: Secondary | ICD-10-CM | POA: Diagnosis not present

## 2021-08-16 ENCOUNTER — Encounter (HOSPITAL_COMMUNITY): Payer: Self-pay | Admitting: Occupational Therapy

## 2021-08-16 ENCOUNTER — Ambulatory Visit (HOSPITAL_COMMUNITY): Payer: 59 | Attending: Family Medicine | Admitting: Occupational Therapy

## 2021-08-16 DIAGNOSIS — M25612 Stiffness of left shoulder, not elsewhere classified: Secondary | ICD-10-CM | POA: Diagnosis not present

## 2021-08-16 DIAGNOSIS — R29898 Other symptoms and signs involving the musculoskeletal system: Secondary | ICD-10-CM | POA: Diagnosis not present

## 2021-08-16 DIAGNOSIS — M25512 Pain in left shoulder: Secondary | ICD-10-CM | POA: Insufficient documentation

## 2021-08-16 NOTE — Therapy (Signed)
?OUTPATIENT OCCUPATIONAL THERAPY TREATMENT NOTE ? ? ?Patient Name: Hannah Cooper ?MRN: [id number] ?DOB:30-Apr-1955, 66 y.o., female ?Today's Date: 08/16/2021 ? ?PCP: Jake Samples, PA-C ?REFERRING PROVIDER: Carole Civil, MD ? ? ?END OF SESSION:  ? OT End of Session - 08/16/21 1152   ? ? Visit Number 5   ? Number of Visits 16   ? Date for OT Re-Evaluation 10/01/21   mini-reassessment 08/30/2021  ? Authorization Type 1) [employer], $20 copay 2) Generic [employer]   ? Authorization Time Period [employer]-60 visit limit combined (PT/OT/ST), 3 used on eval   ? Authorization - Visit Number 8   ? Authorization - Number of Visits 60   ? Progress Note Due on Visit 10   ? OT Start Time 1116   ? OT Stop Time 1154   ? OT Time Calculation (min) 38 min   ? Activity Tolerance Patient tolerated treatment well   ? Behavior During Therapy [facility] for tasks assessed/performed   ? ?  ?  ? ?  ? ? ? ? ? ?Past Medical History:  ?Diagnosis Date  ? Arthritis   ? Atypical chest pain   ? chronic  ? Cancer [facility])   ? bone cancer  ? Chronic back pain   ? Chronic diastolic CHF (congestive heart failure) ([place])   ? COPD (chronic obstructive pulmonary disease) ([place])   ? DDD (degenerative disc disease), cervical   ? Diabetes mellitus without complication ([place])   ? History of radiation therapy 07/16/19-08/22/19  ? L4 spine ; Dr. Gery Pray  ? Hyperlipidemia   ? Hypertension   ? Lumbar radiculopathy   ? Meningitis   ? Normal coronary arteries 2014  ? Palpitations   ? Premature atrial contractions   ? PVC's (premature ventricular contractions)   ? ?Past Surgical History:  ?Procedure Laterality Date  ? ABDOMINAL HYSTERECTOMY    ? BREAST LUMPECTOMY WITH RADIOFREQUENCY TAG IDENTIFICATION Right 01/25/2021  ? Procedure: BREAST LUMPECTOMY WITH RADIOFREQUENCY TAG IDENTIFICATION;  Surgeon: Virl Cagey, MD;  Location: [facility];  Service: General;  Laterality: Right;  ? EXCISION OF BREAST BIOPSY Right 01/25/2021  ? Procedure: EXCISION OF BREAST  BIOPSY;  Surgeon: Virl Cagey, MD;  Location: [facility];  Service: General;  Laterality: Right;  ? IR FLUORO GUIDED NEEDLE PLC ASPIRATION/INJECTION LOC  06/19/2019  ? LEFT HEART CATHETERIZATION WITH CORONARY ANGIOGRAM N/A 09/05/2012  ? Procedure: LEFT HEART CATHETERIZATION WITH CORONARY ANGIOGRAM;  Surgeon: Wellington Hampshire, MD;  Location: [place] CATH LAB;  Service: Cardiovascular;  Laterality: N/A;  ? RESECTION DISTAL CLAVICAL Left 07/03/2020  ? Procedure: RESECTION DISTAL CLAVICAL;  Surgeon: Carole Civil, MD;  Location: [facility];  Service: Orthopedics;  Laterality: Left;  ? SHOULDER OPEN ROTATOR CUFF REPAIR Left 07/03/2020  ? Procedure: ROTATOR CUFF REPAIR SHOULDER OPEN WITH CHROMEOPLASTY;  Surgeon: Carole Civil, MD;  Location: [facility];  Service: Orthopedics;  Laterality: Left;  ? SHOULDER OPEN ROTATOR CUFF REPAIR Left 07/02/2021  ? Procedure: ROTATOR CUFF REPAIR SHOULDER OPEN with Patch Graft;  Surgeon: Carole Civil, MD;  Location: [facility];  Service: Orthopedics;  Laterality: Left;  ? THYROID SURGERY  2002  ? ?Patient Active Problem List  ? Diagnosis Date Noted  ? Traumatic incomplete tear of left rotator cuff   ? Intraductal papilloma of right breast 01/05/2021  ? S/p left rotator cuff repair distal clavicle resection 07/03/20  07/07/2020  ? Complete tear of left rotator cuff   ? 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 Plasma cell disorder 06/10/2019  ? Lesion of bone of lumbosacral spine 05/28/2019  ? Left-sided weakness 03/13/2016  ? Cerebrovascular accident (CVA) ([place])   ? Palpitations   ? COPD (chronic obstructive pulmonary disease) ([place]) 08/20/2014  ? Dyspnea 12/19/2013  ? Chronic diastolic CHF (congestive heart failure) ([place]) 07/05/2013  ? Lower extremity edema 07/05/2013  ? Hyperlipidemia   ? Chest pain 07/04/2013  ? Hypokalemia 09/04/2012  ? Precordial pain 09/04/2012  ? Viral meningitis 01/22/2011  ? PNEUMONIA, LEFT LOWER LOBE 10/10/2006  ? DISEASE, ACUTE BRONCHOSPASM  10/10/2006  ? Essential hypertension 05/23/2006  ? OSTEOARTHRITIS 05/23/2006  ? ? ?ONSET DATE: 07/02/21 ? ?REFERRING DIAG: s/p left RCR ? ?THERAPY DIAG:  ?Acute pain of left shoulder ? ?Other symptoms and signs involving the musculoskeletal system ? ?Stiffness of left shoulder, not elsewhere classified ? ? ?PERTINENT HISTORY: 07/02/2021 ? ?PRECAUTIONS: Shoulder;  ?Standard RCR protocol - from start of therapy date.  ?Sling on 4-6 weeks  ?0-6 weeks: PROM, pendulum (07/02/21-08/13/21) ?6-12 weeks: AAROM progressing to AROM  (08/13/21 - 09/24/21) ?12-24 weeks: Strengthening exercises  ? ?SUBJECTIVE: S: It's doing pretty good.  ? ?PAIN:  ?Are you having pain? Yes: NPRS scale: 3/10 ?Pain location: left shoulder ?Pain description: achy, sore, constant ?Aggravating factors: nothing  ?Relieving factors: pain medication, ice ? ? ? ? ?OBJECTIVE:  ?  ?Assessed supine, er/IR adducted ?  ?Passive ROM Left ?08/02/2021 Left  ?08/11/21  ?Shoulder flexion 77 82  ?Shoulder abduction 62 70  ?Shoulder internal rotation 90 90  ?Shoulder external rotation 26 40  ?(Blank rows = not tested) ?  ?Not assessed due to pain/precautions ?  ?Active ROM Left ?08/02/2021  ?Shoulder flexion    ?Shoulder abduction    ?Shoulder internal rotation    ?Shoulder external rotation    ?(Blank rows = not tested) ?  ?  ?UE MMT:    ?  ?Not assessed due to pain/precautions ?  ?MMT Left ?08/02/2021  ?Shoulder flexion    ?Shoulder abduction    ?Shoulder internal rotation    ?Shoulder external rotation    ?(Blank rows = not tested) ? ? ? PATIENT EDUCATION: ?Education details: reviewed therapy goals ?Person educated: patient ?Education method: verbal ?Education comprehension: verbalized understanding ?  ?  ?HOME EXERCISE PROGRAM: ?4/17: Table slides ?GOALS: ?Goals reviewed with patient? Yes ?  ?SHORT TERM GOALS: Target date: 08/30/2021 ?  ?Pt will be provided with and educated on HEP to improve mobility of LUE required for use as dominant during ADL completion.  ?  ?Goal  status: on-going ?  ?2.  Pt will increase LUE P/ROM to [facility] to improve ability to complete dressing and bathing tasks with minimal compensatory strategies.  ?  ?Goal status: on-going ?  ?3.  Pt will increase LUE strength to 3/5 or greater to improve ability to reach for items at waist to chest height during ADLs such as meal preparation and eating.   ?  ?Goal status: on-going ?  ?  ?  ?  ?LONG TERM GOALS: Target date: 09/27/2021 ?  ?Pt will return to highest functional level using LUE as non-dominant during ADL and leisure tasks.  ?  ?Goal status: on-going ?  ?2.  Pt will decrease pain in LUE to 3/10 or less to improve ability to sleep for 3+ consecutive hours without waking due to pain.  ?  ?Goal status: on-going ?  ?3.  Pt will decrease LUE fascial restrictions to trace amounts to improve mobility required  for functional reaching tasks.  ?  ?Goal status: on-going ?  ?4.  Pt will increase LUE A/ROM to [facility] to improve ability to reach overhead and behind back when bathing and dressing.  ?  ?Goal status: on-going ?  ?5.  Pt will increase LUE strength to 4+/5 or greater to improve ability to perform lifting tasks required for caring great grandchildren.  ?  ?Goal status: on-going ?  ?TODAY'S TREATMENT: ? 08/16/2021 ?- Manual therapy: completed prior to exercises. Myofascial release and manual stretching completed to left upper arm, upper trapezius, and scapularis region. ?- P/ROM: shoulder, supine, flexion, IR/er, abduction, horizontal abduction 10X ?-Scapular A/ROM: row, extension, scapular depression, 10X each ?- Therapy ball: 10X abduction, 10X flexion with 2" hold at end stretch. ?-Low level thumb tacks: 1'  ?-Pendulums: side to side, forward/back, 1' each ? ? ?08/11/21  ?- Manual therapy: completed prior to exercises. Myofascial release and manual stretching completed to left upper arm, upper trapezius, and scapularis region. ?- P/ROM: shoulder, supine, flexion, IR/er, abduction, horizontal abduction 10X ?- Therapy  ball: 10X abduction, 10X flexion with 2" hold at end stretch. ? ? ?08/10/21 ?- Manual therapy: completed prior to exercises. Myofascial release and manual stretching completed to left upper arm, upper trapezius, and

## 2021-08-17 ENCOUNTER — Encounter (HOSPITAL_COMMUNITY): Payer: 59 | Admitting: Physical Therapy

## 2021-08-18 ENCOUNTER — Encounter (HOSPITAL_COMMUNITY): Payer: Self-pay | Admitting: Occupational Therapy

## 2021-08-18 ENCOUNTER — Ambulatory Visit (HOSPITAL_COMMUNITY): Payer: 59 | Admitting: Occupational Therapy

## 2021-08-18 DIAGNOSIS — E1165 Type 2 diabetes mellitus with hyperglycemia: Secondary | ICD-10-CM | POA: Diagnosis not present

## 2021-08-18 DIAGNOSIS — I209 Angina pectoris, unspecified: Secondary | ICD-10-CM | POA: Diagnosis not present

## 2021-08-18 DIAGNOSIS — C9030 Solitary plasmacytoma not having achieved remission: Secondary | ICD-10-CM | POA: Diagnosis not present

## 2021-08-18 DIAGNOSIS — M25612 Stiffness of left shoulder, not elsewhere classified: Secondary | ICD-10-CM

## 2021-08-18 DIAGNOSIS — E1169 Type 2 diabetes mellitus with other specified complication: Secondary | ICD-10-CM | POA: Diagnosis not present

## 2021-08-18 DIAGNOSIS — R29898 Other symptoms and signs involving the musculoskeletal system: Secondary | ICD-10-CM

## 2021-08-18 DIAGNOSIS — M25512 Pain in left shoulder: Secondary | ICD-10-CM | POA: Diagnosis not present

## 2021-08-18 DIAGNOSIS — G8929 Other chronic pain: Secondary | ICD-10-CM | POA: Diagnosis not present

## 2021-08-18 DIAGNOSIS — E669 Obesity, unspecified: Secondary | ICD-10-CM | POA: Diagnosis not present

## 2021-08-18 DIAGNOSIS — E785 Hyperlipidemia, unspecified: Secondary | ICD-10-CM | POA: Diagnosis not present

## 2021-08-18 DIAGNOSIS — G63 Polyneuropathy in diseases classified elsewhere: Secondary | ICD-10-CM | POA: Diagnosis not present

## 2021-08-18 DIAGNOSIS — C9020 Extramedullary plasmacytoma not having achieved remission: Secondary | ICD-10-CM | POA: Diagnosis not present

## 2021-08-18 DIAGNOSIS — I4891 Unspecified atrial fibrillation: Secondary | ICD-10-CM | POA: Diagnosis not present

## 2021-08-18 DIAGNOSIS — D8481 Immunodeficiency due to conditions classified elsewhere: Secondary | ICD-10-CM | POA: Diagnosis not present

## 2021-08-18 DIAGNOSIS — J449 Chronic obstructive pulmonary disease, unspecified: Secondary | ICD-10-CM | POA: Diagnosis not present

## 2021-08-18 NOTE — Therapy (Signed)
?OUTPATIENT OCCUPATIONAL THERAPY TREATMENT NOTE ? ? ?Patient Name: Hannah Cooper ?MRN: [id number] ?DOB:[birth year], 66 y.o., female ?Today's Date: 08/18/2021 ? ?PCP: Jake Samples, PA-C ?REFERRING PROVIDER: Carole Civil, MD ? ? ?END OF SESSION:  ? OT End of Session - 08/18/21 1026   ? ? Visit Number 6   ? Number of Visits 16   ? Date for OT Re-Evaluation 10/01/21   mini-reassessment 08/30/2021  ? Authorization Type 1) [employer], $20 copay 2) Generic [employer]   ? Authorization Time Period [employer]-60 visit limit combined (PT/OT/ST), 3 used on eval   ? Authorization - Visit Number 9   ? Authorization - Number of Visits 60   ? Progress Note Due on Visit 10   ? OT Start Time [id number]   ? OT Stop Time 1028   ? OT Time Calculation (min) 39 min   ? Activity Tolerance Patient tolerated treatment well   ? Behavior During Therapy [facility] for tasks assessed/performed   ? ?  ?  ? ?  ? ? ? ? ? ? ?Past Medical History:  ?Diagnosis Date  ? Arthritis   ? Atypical chest pain   ? chronic  ? Cancer [facility])   ? bone cancer  ? Chronic back pain   ? Chronic diastolic CHF (congestive heart failure) ([place])   ? COPD (chronic obstructive pulmonary disease) ([place])   ? DDD (degenerative disc disease), cervical   ? Diabetes mellitus without complication ([place])   ? History of radiation therapy 07/16/19-08/22/19  ? L4 spine ; Dr. Gery Pray  ? Hyperlipidemia   ? Hypertension   ? Lumbar radiculopathy   ? Meningitis   ? Normal coronary arteries 2014  ? Palpitations   ? Premature atrial contractions   ? PVC's (premature ventricular contractions)   ? ?Past Surgical History:  ?Procedure Laterality Date  ? ABDOMINAL HYSTERECTOMY    ? BREAST LUMPECTOMY WITH RADIOFREQUENCY TAG IDENTIFICATION Right 01/25/2021  ? Procedure: BREAST LUMPECTOMY WITH RADIOFREQUENCY TAG IDENTIFICATION;  Surgeon: Virl Cagey, MD;  Location: [facility];  Service: General;  Laterality: Right;  ? EXCISION OF BREAST BIOPSY Right 01/25/2021  ? Procedure: EXCISION OF BREAST  BIOPSY;  Surgeon: Virl Cagey, MD;  Location: [facility];  Service: General;  Laterality: Right;  ? IR FLUORO GUIDED NEEDLE PLC ASPIRATION/INJECTION LOC  06/19/2019  ? LEFT HEART CATHETERIZATION WITH CORONARY ANGIOGRAM N/A 09/05/2012  ? Procedure: LEFT HEART CATHETERIZATION WITH CORONARY ANGIOGRAM;  Surgeon: Wellington Hampshire, MD;  Location: [place] CATH LAB;  Service: Cardiovascular;  Laterality: N/A;  ? RESECTION DISTAL CLAVICAL Left 07/03/2020  ? Procedure: RESECTION DISTAL CLAVICAL;  Surgeon: Carole Civil, MD;  Location: [facility];  Service: Orthopedics;  Laterality: Left;  ? SHOULDER OPEN ROTATOR CUFF REPAIR Left 07/03/2020  ? Procedure: ROTATOR CUFF REPAIR SHOULDER OPEN WITH CHROMEOPLASTY;  Surgeon: Carole Civil, MD;  Location: [facility];  Service: Orthopedics;  Laterality: Left;  ? SHOULDER OPEN ROTATOR CUFF REPAIR Left 07/02/2021  ? Procedure: ROTATOR CUFF REPAIR SHOULDER OPEN with Patch Graft;  Surgeon: Carole Civil, MD;  Location: [facility];  Service: Orthopedics;  Laterality: Left;  ? THYROID SURGERY  2002  ? ?Patient Active Problem List  ? Diagnosis Date Noted  ? Traumatic incomplete tear of left rotator cuff   ? Intraductal papilloma of right breast 01/05/2021  ? S/p left rotator cuff repair distal clavicle resection 07/03/20  07/07/2020  ? Complete tear of left rotator cuff   ? 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 Plasma cell disorder 06/10/2019  ? Lesion of bone of lumbosacral spine 05/28/2019  ? Left-sided weakness 03/13/2016  ? Cerebrovascular accident (CVA) ([place])   ? Palpitations   ? COPD (chronic obstructive pulmonary disease) ([place]) 08/20/2014  ? Dyspnea 12/19/2013  ? Chronic diastolic CHF (congestive heart failure) ([place]) 07/05/2013  ? Lower extremity edema 07/05/2013  ? Hyperlipidemia   ? Chest pain 07/04/2013  ? Hypokalemia 09/04/2012  ? Precordial pain 09/04/2012  ? Viral meningitis 01/22/2011  ? PNEUMONIA, LEFT LOWER LOBE 10/10/2006  ? DISEASE, ACUTE BRONCHOSPASM  10/10/2006  ? Essential hypertension 05/23/2006  ? OSTEOARTHRITIS 05/23/2006  ? ? ?ONSET DATE: 07/02/21 ? ?REFERRING DIAG: s/p left RCR ? ?THERAPY DIAG:  ?Other symptoms and signs involving the musculoskeletal system ? ?Acute pain of left shoulder ? ?Stiffness of left shoulder, not elsewhere classified ? ? ?PERTINENT HISTORY: 07/02/2021 ? ?PRECAUTIONS: Shoulder;  ?Standard RCR protocol - from start of therapy date.  ?Sling on 4-6 weeks  ?0-6 weeks: PROM, pendulum (07/02/21-08/13/21) ?6-12 weeks: AAROM progressing to AROM  (08/13/21 - 09/24/21) ?12-24 weeks: Strengthening exercises  ? ?SUBJECTIVE: S: It felt like pins and needles when I woke up this morning but it's mostly gone now.  ? ?PAIN:  ?Are you having pain? Yes: NPRS scale: 0/10 ?Pain location:   ?Pain description:   ?Aggravating factors:   ?Relieving factors:   ? ? ? ? ?OBJECTIVE:  ?  ?Assessed supine, er/IR adducted ?  ?Passive ROM Left ?08/02/2021 Left  ?08/11/21  ?Shoulder flexion 77 82  ?Shoulder abduction 62 70  ?Shoulder internal rotation 90 90  ?Shoulder external rotation 26 40  ?(Blank rows = not tested) ?  ?Not assessed due to pain/precautions ?  ?Active ROM Left ?08/02/2021  ?Shoulder flexion    ?Shoulder abduction    ?Shoulder internal rotation    ?Shoulder external rotation    ?(Blank rows = not tested) ?  ?  ?UE MMT:    ?  ?Not assessed due to pain/precautions ?  ?MMT Left ?08/02/2021  ?Shoulder flexion    ?Shoulder abduction    ?Shoulder internal rotation    ?Shoulder external rotation    ?(Blank rows = not tested) ? ? ? PATIENT EDUCATION: ?Education details: reviewed therapy goals ?Person educated: patient ?Education method: verbal ?Education comprehension: verbalized understanding ?  ?  ?HOME EXERCISE PROGRAM: ?4/17: Table slides ?GOALS: ?Goals reviewed with patient? Yes ?  ?SHORT TERM GOALS: Target date: 08/30/2021 ?  ?Pt will be provided with and educated on HEP to improve mobility of LUE required for use as dominant during ADL completion.  ?  ?Goal  status: on-going ?  ?2.  Pt will increase LUE P/ROM to [facility] to improve ability to complete dressing and bathing tasks with minimal compensatory strategies.  ?  ?Goal status: on-going ?  ?3.  Pt will increase LUE strength to 3/5 or greater to improve ability to reach for items at waist to chest height during ADLs such as meal preparation and eating.   ?  ?Goal status: on-going ?  ?  ?  ?  ?LONG TERM GOALS: Target date: 09/27/2021 ?  ?Pt will return to highest functional level using LUE as non-dominant during ADL and leisure tasks.  ?  ?Goal status: on-going ?  ?2.  Pt will decrease pain in LUE to 3/10 or less to improve ability to sleep for 3+ consecutive hours without waking due to pain.  ?  ?Goal status: on-going ?  ?3.  Pt  will decrease LUE fascial restrictions to trace amounts to improve mobility required for functional reaching tasks.  ?  ?Goal status: on-going ?  ?4.  Pt will increase LUE A/ROM to [facility] to improve ability to reach overhead and behind back when bathing and dressing.  ?  ?Goal status: on-going ?  ?5.  Pt will increase LUE strength to 4+/5 or greater to improve ability to perform lifting tasks required for caring great grandchildren.  ?  ?Goal status: on-going ?  ?TODAY'S TREATMENT: ? 08/18/2021 ?- Manual therapy: completed prior to exercises. Myofascial release and manual stretching completed to left upper arm, upper trapezius, and scapularis region. ?- P/ROM: shoulder, supine, flexion, IR/er, abduction, horizontal abduction 10X ?-AA/ROM: supine-protraction, flexion, horizontal abduction, er/IR, abduction, 10X each ?-AA/ROM: standing-protraction, flexion, er/IR, abduction, 10X each ? ? ?08/16/2021 ?- Manual therapy: completed prior to exercises. Myofascial release and manual stretching completed to left upper arm, upper trapezius, and scapularis region. ?- P/ROM: shoulder, supine, flexion, IR/er, abduction, horizontal abduction 10X ?-Scapular A/ROM: row, extension, scapular depression, 10X each ?-  Therapy ball: 10X abduction, 10X flexion with 2" hold at end stretch. ?-Low level thumb tacks: 1'  ?-Pendulums: side to side, forward/back, 1' each ? ? ?08/11/21  ?- Manual therapy: completed prior to exercises. M
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
## 2021-08-19 ENCOUNTER — Other Ambulatory Visit: Payer: Self-pay

## 2021-08-19 ENCOUNTER — Ambulatory Visit
Admission: RE | Admit: 2021-08-19 | Discharge: 2021-08-19 | Disposition: A | Discharge status: Home / Self Care | Payer: HMO | Source: Ambulatory Visit | Attending: Radiation Oncology | Admitting: Radiation Oncology

## 2021-08-19 ENCOUNTER — Encounter (HOSPITAL_COMMUNITY): Payer: 59 | Admitting: Physical Therapy

## 2021-08-19 ENCOUNTER — Encounter: Payer: Self-pay | Admitting: Radiation Oncology

## 2021-08-19 VITALS — BP 121/71 | HR 57 | Temp 97.7°F | Resp 20 | Ht 65.0 in | Wt 229.0 lb

## 2021-08-19 DIAGNOSIS — Z923 Personal history of irradiation: Secondary | ICD-10-CM | POA: Diagnosis not present

## 2021-08-19 DIAGNOSIS — C9030 Solitary plasmacytoma not having achieved remission: Secondary | ICD-10-CM

## 2021-08-19 DIAGNOSIS — Z7984 Long term (current) use of oral hypoglycemic drugs: Secondary | ICD-10-CM | POA: Insufficient documentation

## 2021-08-19 DIAGNOSIS — Z79899 Other long term (current) drug therapy: Secondary | ICD-10-CM | POA: Insufficient documentation

## 2021-08-19 DIAGNOSIS — G8918 Other acute postprocedural pain: Secondary | ICD-10-CM | POA: Diagnosis not present

## 2021-08-19 DIAGNOSIS — C9020 Extramedullary plasmacytoma not having achieved remission: Secondary | ICD-10-CM | POA: Insufficient documentation

## 2021-08-19 MED ORDER — HYDROCODONE-ACETAMINOPHEN 10-325 MG PO TABS: 1.0000 | ORAL_TABLET | Freq: Four times a day (QID) | ORAL | 0 refills | Status: DC | PRN

## 2021-08-19 NOTE — Progress Notes (Signed)
Hannah Cooper is here today for follow up post radiation to the lumbar spine ? ?They completed their radiation on: 08/22/2019  ? ?Does the patient complain of any of the following: ? ?Pain:7/10 low back pain, left shoulder pain following rotator cuff repair ?Diarrhea/Constipation: denies ?Nausea/Vomiting: denies ?Urinary Issues (dysuria/incomplete emptying/ incontinence/ increased frequency/urgency): frequency ?Post radiation skin changes: rough, hyperpigmented area on low back ?Neuropathy in lower extremities has resolved. ? ? ?Additional comments if applicable: nothing of note ? ?Vitals:  ? 08/19/21 0917  ?BP: 121/71  ?Pulse: (!) 57  ?Resp: 20  ?Temp: 97.7 ?F (36.5 ?C)  ?SpO2: 100%  ?Weight: 229 lb (103.9 kg)  ?Height: '5\' 5"'$  (1.651 m)  ? ? ?

## 2021-08-24 ENCOUNTER — Encounter (HOSPITAL_COMMUNITY): Payer: 59 | Admitting: Physical Therapy

## 2021-08-24 ENCOUNTER — Encounter (HOSPITAL_COMMUNITY): Payer: Self-pay

## 2021-08-24 ENCOUNTER — Ambulatory Visit (HOSPITAL_COMMUNITY): Payer: 59

## 2021-08-24 DIAGNOSIS — M25512 Pain in left shoulder: Secondary | ICD-10-CM | POA: Diagnosis not present

## 2021-08-24 DIAGNOSIS — R29898 Other symptoms and signs involving the musculoskeletal system: Secondary | ICD-10-CM

## 2021-08-24 DIAGNOSIS — M25612 Stiffness of left shoulder, not elsewhere classified: Secondary | ICD-10-CM

## 2021-08-24 NOTE — Patient Instructions (Signed)

## 2021-08-24 NOTE — Therapy (Addendum)
?OUTPATIENT OCCUPATIONAL THERAPY TREATMENT NOTE ? ? ?Patient Name: Hannah Cooper ?MRN: [id number] ?DOB:01/23/1956, 66 y.o., female ?Today's Date: 08/24/2021 ? ?PCP: Jake Samples, PA-C ?REFERRING PROVIDER: Carole Civil, MD ? ? ?END OF SESSION:  ? OT End of Session - 08/24/21 0804   ? ? Visit Number 7   ? Number of Visits 16   ? Date for OT Re-Evaluation 10/01/21   mini-reassessment 08/30/2021  ? Authorization Type 1) [employer], $20 copay 2) Generic [employer]   ? Authorization Time Period [employer]-60 visit limit combined (PT/OT/ST), 3 used on eval   ? Authorization - Visit Number 10   ? Authorization - Number of Visits 60   ? Progress Note Due on Visit 10   ? Activity Tolerance Patient tolerated treatment well   ? Behavior During Therapy [facility] for tasks assessed/performed   ? ?  ?  ? ?  ? ?Time in: 7:30AM ?Time out: 8:08AM ?Total: 88' ? ? ? ? ? ?Past Medical History:  ?Diagnosis Date  ? Arthritis   ? Atypical chest pain   ? chronic  ? Cancer [facility])   ? bone cancer  ? Chronic back pain   ? Chronic diastolic CHF (congestive heart failure) ([place])   ? COPD (chronic obstructive pulmonary disease) ([place])   ? DDD (degenerative disc disease), cervical   ? Diabetes mellitus without complication ([place])   ? History of radiation therapy 07/16/19-08/22/19  ? L4 spine ; Dr. Gery Pray  ? Hyperlipidemia   ? Hypertension   ? Lumbar radiculopathy   ? Meningitis   ? Normal coronary arteries 2014  ? Palpitations   ? Premature atrial contractions   ? PVC's (premature ventricular contractions)   ? ?Past Surgical History:  ?Procedure Laterality Date  ? ABDOMINAL HYSTERECTOMY    ? BREAST LUMPECTOMY WITH RADIOFREQUENCY TAG IDENTIFICATION Right 01/25/2021  ? Procedure: BREAST LUMPECTOMY WITH RADIOFREQUENCY TAG IDENTIFICATION;  Surgeon: Virl Cagey, MD;  Location: [facility];  Service: General;  Laterality: Right;  ? EXCISION OF BREAST BIOPSY Right 01/25/2021  ? Procedure: EXCISION OF BREAST BIOPSY;  Surgeon: Virl Cagey, MD;  Location: [facility];  Service: General;  Laterality: Right;  ? IR FLUORO GUIDED NEEDLE PLC ASPIRATION/INJECTION LOC  06/19/2019  ? LEFT HEART CATHETERIZATION WITH CORONARY ANGIOGRAM N/A 09/05/2012  ? Procedure: LEFT HEART CATHETERIZATION WITH CORONARY ANGIOGRAM;  Surgeon: Wellington Hampshire, MD;  Location: [facility] CATH [facility];  Service: Cardiovascular;  Laterality: N/A;  ? RESECTION DISTAL CLAVICAL Left 07/03/2020  ? Procedure: RESECTION DISTAL CLAVICAL;  Surgeon: Carole Civil, MD;  Location: [facility];  Service: Orthopedics;  Laterality: Left;  ? SHOULDER OPEN ROTATOR CUFF REPAIR Left 07/03/2020  ? Procedure: ROTATOR CUFF REPAIR SHOULDER OPEN WITH CHROMEOPLASTY;  Surgeon: Carole Civil, MD;  Location: [facility];  Service: Orthopedics;  Laterality: Left;  ? SHOULDER OPEN ROTATOR CUFF REPAIR Left 07/02/2021  ? Procedure: ROTATOR CUFF REPAIR SHOULDER OPEN with Patch Graft;  Surgeon: Carole Civil, MD;  Location: [facility];  Service: Orthopedics;  Laterality: Left;  ? THYROID SURGERY  2002  ? ?Patient Active Problem List  ? Diagnosis Date Noted  ? Traumatic incomplete tear of left rotator cuff   ? Intraductal papilloma of right breast 01/05/2021  ? S/p left rotator cuff repair distal clavicle resection 07/03/20  07/07/2020  ? Complete tear of left rotator cuff   ? 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[place]) 07/10/2019  ? Plasma cell disorder 06/10/2019  ? Lesion of bone of  lumbosacral spine 05/28/2019  ? Left-sided weakness 03/13/2016  ? Cerebrovascular accident (CVA) ([place])   ? Palpitations   ? COPD (chronic obstructive pulmonary disease) ([place]) 08/20/2014  ? Dyspnea 12/19/2013  ? Chronic diastolic CHF (congestive heart failure) ([place]) 07/05/2013  ? Lower extremity edema 07/05/2013  ? Hyperlipidemia   ? Chest pain 07/04/2013  ? Hypokalemia 09/04/2012  ? Precordial pain 09/04/2012  ? Viral meningitis 01/22/2011  ? PNEUMONIA, LEFT LOWER LOBE 10/10/2006  ? DISEASE, ACUTE BRONCHOSPASM 10/10/2006  ? Essential hypertension  05/23/2006  ? OSTEOARTHRITIS 05/23/2006  ? ? ?ONSET DATE: 07/02/21 ? ?REFERRING DIAG: s/p left RCR ? ?THERAPY DIAG:  ?Other symptoms and signs involving the musculoskeletal system ? ?Acute pain of left shoulder ? ?Stiffness of left shoulder, not elsewhere classified ? ? ?PERTINENT HISTORY: 07/02/2021 ? ?PRECAUTIONS: Shoulder;  ?Standard RCR protocol - from start of therapy date.  ?Sling on 4-6 weeks  ?0-6 weeks: PROM, pendulum (07/02/21-08/13/21) ?6-12 weeks: AAROM progressing to AROM  (08/13/21 - 09/24/21) ?12-24 weeks: Strengthening exercises  ? ?SUBJECTIVE: S: It's ok today. ? ?PAIN:  ?Are you having pain? No ? NPRS scale: 0/10 ?Pain location:  ?Pain description:  ?Aggravating factors:  ?Relieving factors:  ? ? ? ? ?OBJECTIVE:  ?  ?Assessed supine, er/IR adducted ?  ?Passive ROM Left ?08/02/2021 Left  ?08/11/21  ?Shoulder flexion 77 82  ?Shoulder abduction 62 70  ?Shoulder internal rotation 90 90  ?Shoulder external rotation 26 40  ?(Blank rows = not tested) ?  ?Not assessed due to pain/precautions ?  ?Active ROM Left ?08/02/2021  ?Shoulder flexion    ?Shoulder abduction    ?Shoulder internal rotation    ?Shoulder external rotation    ?(Blank rows = not tested) ?  ?  ?UE MMT:    ?  ?Not assessed due to pain/precautions ?  ?MMT Left ?08/02/2021  ?Shoulder flexion    ?Shoulder abduction    ?Shoulder internal rotation    ?Shoulder external rotation    ?(Blank rows = not tested) ? ? ? PATIENT EDUCATION: ?Education details: AA/ROM standing ?Person educated: patient ?Education method: verbal, handout ?Education comprehension: verbalized understanding ?  ?  ?HOME EXERCISE PROGRAM: ?4/17: Table slides 5/9: AA/ROM ?GOALS: ?Goals reviewed with patient? Yes ?  ?SHORT TERM GOALS: Target date: 08/30/2021 ?  ?Pt will be provided with and educated on HEP to improve mobility of LUE required for use as dominant during ADL completion.  ?  ?Goal status: on-going ?  ?2.  Pt will increase LUE P/ROM to [facility] to improve ability to complete dressing  and bathing tasks with minimal compensatory strategies.  ?  ?Goal status: on-going ?  ?3.  Pt will increase LUE strength to 3/5 or greater to improve ability to reach for items at waist to chest height during ADLs such as meal preparation and eating.   ?  ?Goal status: on-going ?  ?  ?  ?  ?LONG TERM GOALS: Target date: 09/27/2021 ?  ?Pt will return to highest functional level using LUE as non-dominant during ADL and leisure tasks.  ?  ?Goal status: on-going ?  ?2.  Pt will decrease pain in LUE to 3/10 or less to improve ability to sleep for 3+ consecutive hours without waking due to pain.  ?  ?Goal status: on-going ?  ?3.  Pt will decrease LUE fascial restrictions to trace amounts to improve mobility required for functional reaching tasks.  ?  ?Goal status: on-going ?  ?4.  Pt will increase LUE A/ROM  to [employer] to improve ability to reach overhead and behind back when bathing and dressing.  ?  ?Goal status: on-going ?  ?5.  Pt will increase LUE strength to 4+/5 or greater to improve ability to perform lifting tasks required for caring great grandchildren.  ?  ?Goal status: on-going ?  ?TODAY'S TREATMENT: ? 08/24/21 ?- Manual therapy: completed prior to exercises. Myofascial release and manual stretching completed to left upper arm, upper trapezius, and scapularis region. ?- P/ROM: shoulder, supine, flexion, IR/er, abduction, horizontal abduction 10X ?- AA/ROM: supine-protraction, flexion, horizontal abduction, er/IR, abduction, 10X each ?- AA/ROM: standing-protraction, flexion, er/IR, abduction, 10X each ?- pulleys: flexion 1' standing  ? ?08/18/2021 ?- Manual therapy: completed prior to exercises. Myofascial release and manual stretching completed to left upper arm, upper trapezius, and scapularis region. ?- P/ROM: shoulder, supine, flexion, IR/er, abduction, horizontal abduction 10X ?-AA/ROM: supine-protraction, flexion, horizontal abduction, er/IR, abduction, 10X each ?-AA/ROM: standing-protraction, flexion, er/IR,  abduction, 10X each ? ? ? ? ? ? ?ASSESSMENT: ?  ?CLINICAL IMPRESSION: A: Myofacial release completed to address fascial restrictions in the LUE. Patient presents with no restrictions noted in the upper arm

## 2021-08-26 ENCOUNTER — Encounter (HOSPITAL_COMMUNITY): Payer: 59 | Admitting: Physical Therapy

## 2021-08-26 ENCOUNTER — Ambulatory Visit (HOSPITAL_COMMUNITY): Payer: 59

## 2021-08-26 ENCOUNTER — Encounter (HOSPITAL_COMMUNITY): Payer: Self-pay

## 2021-08-26 DIAGNOSIS — M25512 Pain in left shoulder: Secondary | ICD-10-CM

## 2021-08-26 DIAGNOSIS — M25612 Stiffness of left shoulder, not elsewhere classified: Secondary | ICD-10-CM

## 2021-08-26 DIAGNOSIS — R29898 Other symptoms and signs involving the musculoskeletal system: Secondary | ICD-10-CM

## 2021-08-26 NOTE — Therapy (Addendum)
?OUTPATIENT OCCUPATIONAL THERAPY TREATMENT NOTE ? ? ?Patient Name: Hannah Cooper ?MRN: [id number] ?DOB:July 03, 1955, 66 y.o., female ?Today's Date: 08/26/2021 ? ?PCP: Jake Samples, PA-C ?REFERRING PROVIDER: Carole Civil, MD ? ? ?END OF SESSION:  ? OT End of Session - 08/26/21 0816   ? ? Visit Number 8   ? Number of Visits 16   ? Date for OT Re-Evaluation 10/01/21   mini-reassessment 08/30/2021  ? Authorization Type 1) [employer], $20 copay 2) Generic [employer]   ? Authorization Time Period [employer]-60 visit limit combined (PT/OT/ST), 3 used on eval   ? Authorization - Visit Number 11   ? Authorization - Number of Visits 60   ? Progress Note Due on Visit 10   ? OT Start Time [phone]   checked in late  ? OT Stop Time 0810   ? OT Time Calculation (min) 35 min   ? Activity Tolerance Patient tolerated treatment well   ? Behavior During Therapy [facility] for tasks assessed/performed   ? ?  ?  ? ?  ? ? ? ? ? ? ? ? ?Past Medical History:  ?Diagnosis Date  ? Arthritis   ? Atypical chest pain   ? chronic  ? Cancer [facility])   ? bone cancer  ? Chronic back pain   ? Chronic diastolic CHF (congestive heart failure) ([place])   ? COPD (chronic obstructive pulmonary disease) ([place])   ? DDD (degenerative disc disease), cervical   ? Diabetes mellitus without complication ([place])   ? History of radiation therapy 07/16/19-08/22/19  ? L4 spine ; Dr. Gery Pray  ? Hyperlipidemia   ? Hypertension   ? Lumbar radiculopathy   ? Meningitis   ? Normal coronary arteries 2014  ? Palpitations   ? Premature atrial contractions   ? PVC's (premature ventricular contractions)   ? ?Past Surgical History:  ?Procedure Laterality Date  ? ABDOMINAL HYSTERECTOMY    ? BREAST LUMPECTOMY WITH RADIOFREQUENCY TAG IDENTIFICATION Right 01/25/2021  ? Procedure: BREAST LUMPECTOMY WITH RADIOFREQUENCY TAG IDENTIFICATION;  Surgeon: Virl Cagey, MD;  Location: [facility];  Service: General;  Laterality: Right;  ? EXCISION OF BREAST BIOPSY Right 01/25/2021  ?  Procedure: EXCISION OF BREAST BIOPSY;  Surgeon: Virl Cagey, MD;  Location: [facility];  Service: General;  Laterality: Right;  ? IR FLUORO GUIDED NEEDLE PLC ASPIRATION/INJECTION LOC  06/19/2019  ? LEFT HEART CATHETERIZATION WITH CORONARY ANGIOGRAM N/A 09/05/2012  ? Procedure: LEFT HEART CATHETERIZATION WITH CORONARY ANGIOGRAM;  Surgeon: Wellington Hampshire, MD;  Location: [facility] CATH LAB;  Service: Cardiovascular;  Laterality: N/A;  ? RESECTION DISTAL CLAVICAL Left 07/03/2020  ? Procedure: RESECTION DISTAL CLAVICAL;  Surgeon: Carole Civil, MD;  Location: [facility];  Service: Orthopedics;  Laterality: Left;  ? SHOULDER OPEN ROTATOR CUFF REPAIR Left 07/03/2020  ? Procedure: ROTATOR CUFF REPAIR SHOULDER OPEN WITH CHROMEOPLASTY;  Surgeon: Carole Civil, MD;  Location: [facility];  Service: Orthopedics;  Laterality: Left;  ? SHOULDER OPEN ROTATOR CUFF REPAIR Left 07/02/2021  ? Procedure: ROTATOR CUFF REPAIR SHOULDER OPEN with Patch Graft;  Surgeon: Carole Civil, MD;  Location: [facility];  Service: Orthopedics;  Laterality: Left;  ? THYROID SURGERY  2002  ? ?Patient Active Problem List  ? Diagnosis Date Noted  ? Traumatic incomplete tear of left rotator cuff   ? Intraductal papilloma of right breast 01/05/2021  ? S/p left rotator cuff repair distal clavicle resection 07/03/20  07/07/2020  ? Complete tear of left rotator cuff   ?  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[place]) 07/10/2019  ? Plasma cell disorder 06/10/2019  ? Lesion of bone of lumbosacral spine 05/28/2019  ? Left-sided weakness 03/13/2016  ? Cerebrovascular accident (CVA) ([place])   ? Palpitations   ? COPD (chronic obstructive pulmonary disease) ([place]) 08/20/2014  ? Dyspnea 12/19/2013  ? Chronic diastolic CHF (congestive heart failure) ([place]) 07/05/2013  ? Lower extremity edema 07/05/2013  ? Hyperlipidemia   ? Chest pain 07/04/2013  ? Hypokalemia 09/04/2012  ? Precordial pain 09/04/2012  ? Viral meningitis 01/22/2011  ? PNEUMONIA, LEFT LOWER LOBE 10/10/2006  ?  DISEASE, ACUTE BRONCHOSPASM 10/10/2006  ? Essential hypertension 05/23/2006  ? OSTEOARTHRITIS 05/23/2006  ? ? ?ONSET DATE: 07/02/21 ? ?REFERRING DIAG: s/p left RCR ? ?THERAPY DIAG:  ?Other symptoms and signs involving the musculoskeletal system ? ?Stiffness of left shoulder, not elsewhere classified ? ?Acute pain of left shoulder ? ? ?PERTINENT HISTORY: 07/02/2021 ? ?PRECAUTIONS: Shoulder;  ?Standard RCR protocol - from start of therapy date.  ?Sling on 4-6 weeks  ?0-6 weeks: PROM, pendulum (07/02/21-08/13/21) ?6-12 weeks: AAROM progressing to AROM  (08/13/21 - 09/24/21) ?12-24 weeks: Strengthening exercises  ? ?SUBJECTIVE: S: It's sore but no pain. ? ?PAIN:  ?Are you having pain? No ?NPRS scale: 0/10 ?Pain location:  ?Pain description:  ?Aggravating factors:  ?Relieving factors:  ? ? ? ? ?OBJECTIVE:  ?  ?Assessed supine, er/IR adducted ?  ?Passive ROM Left ?08/02/2021 Left  ?08/11/21  ?Shoulder flexion 77 82  ?Shoulder abduction 62 70  ?Shoulder internal rotation 90 90  ?Shoulder external rotation 26 40  ?(Blank rows = not tested) ?  ?Not assessed due to pain/precautions ?  ?Active ROM Left ?08/02/2021  ?Shoulder flexion    ?Shoulder abduction    ?Shoulder internal rotation    ?Shoulder external rotation    ?(Blank rows = not tested) ?  ?  ?UE MMT:    ?  ?Not assessed due to pain/precautions ?  ?MMT Left ?08/02/2021  ?Shoulder flexion    ?Shoulder abduction    ?Shoulder internal rotation    ?Shoulder external rotation    ?(Blank rows = not tested) ? ? ? PATIENT EDUCATION: ?Education details:  ?Person educated:  ?IT sales professional:  ?Education comprehension:  ?  ?  ?HOME EXERCISE PROGRAM: ?4/17: Table slides 5/9: AA/ROM ?GOALS: ?Goals reviewed with patient? Yes ?  ?SHORT TERM GOALS: Target date: 08/30/2021 ?  ?Pt will be provided with and educated on HEP to improve mobility of LUE required for use as dominant during ADL completion.  ?  ?Goal status: on-going ?  ?2.  Pt will increase LUE P/ROM to [facility] to improve ability to  complete dressing and bathing tasks with minimal compensatory strategies.  ?  ?Goal status: on-going ?  ?3.  Pt will increase LUE strength to 3/5 or greater to improve ability to reach for items at waist to chest height during ADLs such as meal preparation and eating.   ?  ?Goal status: on-going ?  ?  ?  ?  ?LONG TERM GOALS: Target date: 09/27/2021 ?  ?Pt will return to highest functional level using LUE as non-dominant during ADL and leisure tasks.  ?  ?Goal status: on-going ?  ?2.  Pt will decrease pain in LUE to 3/10 or less to improve ability to sleep for 3+ consecutive hours without waking due to pain.  ?  ?Goal status: on-going ?  ?3.  Pt will decrease LUE fascial restrictions to trace amounts to improve mobility required for functional  reaching tasks.  ?  ?Goal status: on-going ?  ?4.  Pt will increase LUE A/ROM to [facility] to improve ability to reach overhead and behind back when bathing and dressing.  ?  ?Goal status: on-going ?  ?5.  Pt will increase LUE strength to 4+/5 or greater to improve ability to perform lifting tasks required for caring great grandchildren.  ?  ?Goal status: on-going ?  ?TODAY'S TREATMENT: ? 08/26/21 ?- P/ROM: shoulder, supine, flexion, IR/er, abduction, horizontal abduction 5X ?- AA/ROM: supine-protraction, flexion, horizontal abduction, er/IR, abduction, 12X each ? - AA/ROM: standing-protraction, flexion, er/IR, abduction, 12X each ?- Proximal shoulder strengthening: supine,  12X each no rest breaks ? ? ?08/24/21 ?- Manual therapy: completed prior to exercises. Myofascial release and manual stretching completed to left upper arm, upper trapezius, and scapularis region. ?- P/ROM: shoulder, supine, flexion, IR/er, abduction, horizontal abduction 10X ?- AA/ROM: supine-protraction, flexion, horizontal abduction, er/IR, abduction, 10X each ?- AA/ROM: standing-protraction, flexion, er/IR, abduction, 10X each ?- pulleys: flexion 1' standing  ? ?08/18/2021 ?- Manual therapy: completed prior to  exercises. Myofascial release and manual stretching completed to left upper arm, upper trapezius, and scapularis region. ?- P/ROM: shoulder, supine, flexion, IR/er, abduction, horizontal abduction 10X ?-AA/ROM: supine-pr

## 2021-08-31 NOTE — Progress Notes (Signed)
Chief Complaint  ?Patient presents with  ? Routine Post Op  ?  LT RCR DOS 07/02/21  ? Medication Refill  ? ? ?Postop repeat open left rotator cuff repair ? ?Patient had the following findings ? ?Findings at surgery mostly an intact rotator cuff tear except for the very anterior fibers of the supraspinatus which had pending without full-thickness tearing of the supraspinatus tendon of the rotator cuff ? ?  ?  ?PROCEDURE:  Procedure(s): ?ROTATOR CUFF REPAIR SHOULDER OPEN with Patch Graft (Left) ?  ?Patch graft ?  ?AFLEX, 1.25 mm thickness cut down to fit the defect ?Human dermis ? ?She has been slow with rehab.  She is somewhat noncompliant and assents ? ?She will continue physical therapy try to improve her range of motion so she can start progressive resistance exercises ?

## 2021-09-01 ENCOUNTER — Ambulatory Visit (HOSPITAL_COMMUNITY): Payer: 59 | Admitting: Occupational Therapy

## 2021-09-01 ENCOUNTER — Encounter (HOSPITAL_COMMUNITY): Payer: Self-pay | Admitting: Occupational Therapy

## 2021-09-01 ENCOUNTER — Telehealth: Payer: Self-pay | Admitting: Orthopedic Surgery

## 2021-09-01 DIAGNOSIS — R29898 Other symptoms and signs involving the musculoskeletal system: Secondary | ICD-10-CM

## 2021-09-01 DIAGNOSIS — M25512 Pain in left shoulder: Secondary | ICD-10-CM | POA: Diagnosis not present

## 2021-09-01 DIAGNOSIS — M25612 Stiffness of left shoulder, not elsewhere classified: Secondary | ICD-10-CM
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
## 2021-09-01 NOTE — Therapy (Signed)
?OUTPATIENT OCCUPATIONAL THERAPY TREATMENT NOTE ? ? ?Patient Name: Hannah Cooper ?MRN: [id number] ?DOB:08-18-1955, 66 y.o., female ?Today's Date: 09/01/2021 ? ?PCP: Jake Samples, PA-C ?REFERRING PROVIDER: Carole Civil, MD ? ? ?END OF SESSION:  ? OT End of Session - 09/01/21 0943   ? ? Visit Number 9   ? Number of Visits 16   ? Date for OT Re-Evaluation 10/01/21   ? Authorization Type 1) [employer], $20 copay 2) Generic [employer]   ? Authorization Time Period [employer]-60 visit limit combined (PT/OT/ST), 3 used on eval   ? Authorization - Visit Number 12   ? Authorization - Number of Visits 60   ? Progress Note Due on Visit 19   ? OT Start Time 0911   pt arrived late  ? OT Stop Time 0944   ? OT Time Calculation (min) 33 min   ? Activity Tolerance Patient tolerated treatment well   ? Behavior During Therapy [facility] for tasks assessed/performed   ? ?  ?  ? ?  ? ? ? ? ? ? ? ? ?Progress Note ?Reporting Period 08/02/2021 to 09/01/2021 ? ?See note below for Objective Data and Assessment of Progress/Goals.  ? ?  ?Past Medical History:  ?Diagnosis Date  ? Arthritis   ? Atypical chest pain   ? chronic  ? Cancer [facility])   ? bone cancer  ? Chronic back pain   ? Chronic diastolic CHF (congestive heart failure) ([place])   ? COPD (chronic obstructive pulmonary disease) ([place])   ? DDD (degenerative disc disease), cervical   ? Diabetes mellitus without complication ([place])   ? History of radiation therapy 07/16/19-08/22/19  ? L4 spine ; Dr. Gery Pray  ? Hyperlipidemia   ? Hypertension   ? Lumbar radiculopathy   ? Meningitis   ? Normal coronary arteries 2014  ? Palpitations   ? Premature atrial contractions   ? PVC's (premature ventricular contractions)   ? ?Past Surgical History:  ?Procedure Laterality Date  ? ABDOMINAL HYSTERECTOMY    ? BREAST LUMPECTOMY WITH RADIOFREQUENCY TAG IDENTIFICATION Right 01/25/2021  ? Procedure: BREAST LUMPECTOMY WITH RADIOFREQUENCY TAG IDENTIFICATION;  Surgeon: Virl Cagey, MD;  [facility]  [facility];  Service: General;  Laterality: Right;  ? EXCISION OF BREAST BIOPSY Right 01/25/2021  ? Procedure: EXCISION OF BREAST BIOPSY;  Surgeon: Virl Cagey, MD;  Location: [facility];  Service: General;  Laterality: Right;  ? IR FLUORO GUIDED NEEDLE PLC ASPIRATION/INJECTION LOC  06/19/2019  ? LEFT HEART CATHETERIZATION WITH CORONARY ANGIOGRAM N/A 09/05/2012  ? Procedure: LEFT HEART CATHETERIZATION WITH CORONARY ANGIOGRAM;  Surgeon: Wellington Hampshire, MD;  Location: [place] CATH LAB;  Service: Cardiovascular;  Laterality: N/A;  ? RESECTION DISTAL CLAVICAL Left 07/03/2020  ? Procedure: RESECTION DISTAL CLAVICAL;  Surgeon: Carole Civil, MD;  Location: [facility];  Service: Orthopedics;  Laterality: Left;  ? SHOULDER OPEN ROTATOR CUFF REPAIR Left 07/03/2020  ? Procedure: ROTATOR CUFF REPAIR SHOULDER OPEN WITH CHROMEOPLASTY;  Surgeon: Carole Civil, MD;  Location: [facility];  Service: Orthopedics;  Laterality: Left;  ? SHOULDER OPEN ROTATOR CUFF REPAIR Left 07/02/2021  ? Procedure: ROTATOR CUFF REPAIR SHOULDER OPEN with Patch Graft;  Surgeon: Carole Civil, MD;  Location: [facility];  Service: Orthopedics;  Laterality: Left;  ? THYROID SURGERY  2002  ? ?Patient Active Problem List  ? Diagnosis Date Noted  ? Traumatic incomplete tear of left rotator cuff   ? Intraductal papilloma of right breast 01/05/2021  ? S/p  left rotator cuff repair distal clavicle resection 07/03/20  07/07/2020  ? Complete tear of left rotator cuff   ? 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[place]) 07/10/2019  ? Plasma cell disorder 06/10/2019  ? Lesion of bone of lumbosacral spine 05/28/2019  ? Left-sided weakness 03/13/2016  ? Cerebrovascular accident (CVA) ([place])   ? Palpitations   ? COPD (chronic obstructive pulmonary disease) ([place]) 08/20/2014  ? Dyspnea 12/19/2013  ? Chronic diastolic CHF (congestive heart failure) ([place]) 07/05/2013  ? Lower extremity edema 07/05/2013  ? Hyperlipidemia   ? Chest pain 07/04/2013  ? Hypokalemia 09/04/2012  ?  Precordial pain 09/04/2012  ? Viral meningitis 01/22/2011  ? PNEUMONIA, LEFT LOWER LOBE 10/10/2006  ? DISEASE, ACUTE BRONCHOSPASM 10/10/2006  ? Essential hypertension 05/23/2006  ? OSTEOARTHRITIS 05/23/2006  ? ? ?ONSET DATE: 07/02/21 ? ?REFERRING DIAG: s/p left RCR ? ?THERAPY DIAG:  ?Other symptoms and signs involving the musculoskeletal system ? ?Stiffness of left shoulder, not elsewhere classified ? ?Acute pain of left shoulder ? ? ?PERTINENT HISTORY: 07/02/2021 ? ?PRECAUTIONS: Shoulder;  ?Standard RCR protocol - from start of therapy date.  ?Sling on 4-6 weeks  ?0-6 weeks: PROM, pendulum (07/02/21-08/13/21) ?6-12 weeks: AAROM progressing to AROM  (08/13/21 - 09/24/21) ?12-24 weeks: Strengthening exercises  ? ?SUBJECTIVE: S: My hip is hurting really bad.  ? ?PAIN:  ?Are you having pain? Yes ?NPRS scale: 9/10 ?Pain location: right hip ?Pain description: sharp, shooting, dull, ache ?Aggravating factors: movement ?Relieving factors: unsure ? ? ? ? ?OBJECTIVE: ? ?FOTO:  ?58/100 (30/100 previous)  ?  ?Assessed supine, er/IR adducted ?  ?Passive ROM Left ?08/02/2021 Left  ?08/11/21 Left ?09/01/21  ?Shoulder flexion 77 82 135  ?Shoulder abduction 62 70 112  ?Shoulder internal rotation 90 90 90  ?Shoulder external rotation 26 40 53  ?(Blank rows = not tested) ?  ?Not assessed due to pain/precautions ?  ?Active ROM Left ?08/02/2021 Left ?09/01/2021  ?Shoulder flexion   112  ?Shoulder abduction   106  ?Shoulder internal rotation   90  ?Shoulder external rotation   62  ?(Blank rows = not tested) ?  ?  ?UE MMT:    ?  ?Not assessed due to pain/precautions ?  ?MMT Left ?08/02/2021 Left ?09/01/2021  ?Shoulder flexion   3-/5  ?Shoulder abduction   3-/5  ?Shoulder internal rotation   3/5  ?Shoulder external rotation   3/5  ?(Blank rows = not tested) ? ? ? ? PATIENT EDUCATION: ?Education details:  ?Person educated:  ?IT sales professional:  ?Education comprehension:  ?  ?  ?HOME EXERCISE PROGRAM: ?4/17: Table slides 5/9: AA/ROM ?GOALS: ?Goals  reviewed with patient? Yes ?  ?SHORT TERM GOALS: Target date: 08/30/2021 ?  ?Pt will be provided with and educated on HEP to improve mobility of LUE required for use as dominant during ADL completion.  ?  ?Goal status: Achieved ?  ?2.  Pt will increase LUE P/ROM to [facility] to improve ability to complete dressing and bathing tasks with minimal compensatory strategies.  ?  ?Goal status: on-going ?  ?3.  Pt will increase LUE strength to 3/5 or greater to improve ability to reach for items at waist to chest height during ADLs such as meal preparation and eating.   ?  ?Goal status: Partially Met ?  ?  ?  ?  ?LONG TERM GOALS: Target date: 09/27/2021 ?  ?Pt will return to highest functional level using LUE as non-dominant during ADL and leisure tasks.  ?  ?Goal status: on-going ?  ?  2.  Pt will decrease pain in LUE to 3/10 or less to improve ability to sleep for 3+ consecutive hours without waking due to pain.  ?  ?Goal status: on-going ?  ?3.  Pt will decrease LUE fascial restrictions to trace amounts to improve mobility required for functional reaching tasks.  ?  ?Goal status: on-going ?  ?4.  Pt will increase LUE A/ROM to [facility] to improve ability to reach overhead and behind back when bathing and dressing.  ?  ?Goal status: on-going ?  ?5.  Pt will increase LUE strength to 4+/5 or greater to improve ability to perform lifting tasks required for caring great grandchildren.  ?  ?Goal status: on-going ?  ? ?TODAY'S TREATMENT: ? 09/01/2021 ?- P/ROM: shoulder, supine, flexion, IR/er, abduction, horizontal abduction 5X ?- AA/ROM: seated-protraction, flexion, horizontal abduction, er/IR, abduction, 10X each ? -UBE: Level 1' 3' forward 3' reverse, pace: 5.0 ? ?08/26/21 ?- P/ROM: shoulder, supine, flexion, IR/er, abduction, horizontal abduction 5X ?- AA/ROM: supine-protraction, flexion, horizontal abduction, er/IR, abduction, 12X each ? - AA/ROM: standing-protraction, flexion, er/IR, abduction, 12X each ?- Proximal shoulder strengthening:  supine,  12X each no rest breaks ? ? ?08/24/21 ?- Manual therapy: completed prior to exercises. Myofascial release and manual stretching completed to left upper arm, upper trapezius, and scapularis region. ?- P/ROM:

## 2021-09-03 ENCOUNTER — Telehealth: Payer: Self-pay | Admitting: Orthopedic Surgery

## 2021-09-03 ENCOUNTER — Encounter (HOSPITAL_COMMUNITY): Payer: HMO | Admitting: Occupational Therapy

## 2021-09-03 NOTE — Telephone Encounter (Signed)
Patient came to office to request refill on: HYDROcodone-acetaminophen (NORCO) 10-325 MG tablet (Discontinued) 30 tablet       [place]  - please review / advise.

## 2021-09-07 ENCOUNTER — Encounter (HOSPITAL_COMMUNITY): Payer: Self-pay | Admitting: Occupational Therapy

## 2021-09-07 ENCOUNTER — Ambulatory Visit (HOSPITAL_COMMUNITY): Payer: 59 | Admitting: Occupational Therapy

## 2021-09-07 DIAGNOSIS — M25512 Pain in left shoulder: Secondary | ICD-10-CM | POA: Diagnosis not present

## 2021-09-07 DIAGNOSIS — Z0001 Encounter for general adult medical examination with abnormal findings: Secondary | ICD-10-CM | POA: Diagnosis not present

## 2021-09-07 DIAGNOSIS — E118 Type 2 diabetes mellitus with unspecified complications: Secondary | ICD-10-CM | POA: Diagnosis not present

## 2021-09-07 DIAGNOSIS — M25612 Stiffness of left shoulder, not elsewhere classified: Secondary | ICD-10-CM

## 2021-09-07 DIAGNOSIS — R29898 Other symptoms and signs involving the musculoskeletal system: Secondary | ICD-10-CM

## 2021-09-07 DIAGNOSIS — Z9089 Acquired absence of other organs: Secondary | ICD-10-CM | POA: Diagnosis not present

## 2021-09-07 NOTE — Therapy (Signed)
OUTPATIENT OCCUPATIONAL THERAPY TREATMENT NOTE   Patient Name: Hannah Cooper MRN: [id number] DOB:[birth year], 66 y.o., female Today's Date: 09/07/2021  PCP: Jake Samples, PA-C REFERRING PROVIDER: Carole Civil, MD   END OF SESSION:   OT End of Session - 09/07/21 1030     Visit Number 10    Number of Visits 16    Date for OT Re-Evaluation 10/01/21    Authorization Type 1) [employer], $20 copay 2) Generic [employer]    Authorization Time Period [employer]-60 visit limit combined (PT/OT/ST), 3 used on eval    Authorization - Visit Number 13    Authorization - Number of Visits 60    Progress Note Due on Visit 19    OT Start Time 0957    OT Stop Time 1029    OT Time Calculation (min) 32 min    Activity Tolerance Patient tolerated treatment well    Behavior During Therapy [facility] for tasks assessed/performed                    Progress Note Reporting Period 08/02/2021 to 09/01/2021  See note below for Objective Data and Assessment of Progress/Goals.     Past Medical History:  Diagnosis Date   Arthritis    Atypical chest pain    chronic   Cancer (HCC)    bone cancer   Chronic back pain    Chronic diastolic CHF (congestive heart failure) (HCC)    COPD (chronic obstructive pulmonary disease) (HCC)    DDD (degenerative disc disease), cervical    Diabetes mellitus without complication ([place])    History of radiation therapy 07/16/19-08/22/19   L4 spine ; Dr. Gery Pray   Hyperlipidemia    Hypertension    Lumbar radiculopathy    Meningitis    Normal coronary arteries 2014   Palpitations    Premature atrial contractions    PVC's (premature ventricular contractions)    Past Surgical History:  Procedure Laterality Date   ABDOMINAL HYSTERECTOMY     BREAST LUMPECTOMY WITH RADIOFREQUENCY TAG IDENTIFICATION Right 01/25/2021   Procedure: BREAST LUMPECTOMY WITH RADIOFREQUENCY TAG IDENTIFICATION;  Surgeon: Virl Cagey, MD;  Location: [facility];   Service: General;  Laterality: Right;   EXCISION OF BREAST BIOPSY Right 01/25/2021   Procedure: EXCISION OF BREAST BIOPSY;  Surgeon: Virl Cagey, MD;  Location: [facility];  Service: General;  Laterality: Right;   IR FLUORO GUIDED NEEDLE PLC ASPIRATION/INJECTION LOC  06/19/2019   LEFT HEART CATHETERIZATION WITH CORONARY ANGIOGRAM N/A 09/05/2012   Procedure: LEFT HEART CATHETERIZATION WITH CORONARY ANGIOGRAM;  Surgeon: Wellington Hampshire, MD;  Location: [place] CATH LAB;  Service: Cardiovascular;  Laterality: N/A;   RESECTION DISTAL CLAVICAL Left 07/03/2020   Procedure: RESECTION DISTAL CLAVICAL;  Surgeon: Carole Civil, MD;  Location: [facility];  Service: Orthopedics;  Laterality: Left;   SHOULDER OPEN ROTATOR CUFF REPAIR Left 07/03/2020   Procedure: ROTATOR CUFF REPAIR SHOULDER OPEN WITH CHROMEOPLASTY;  Surgeon: Carole Civil, MD;  Location: [facility];  Service: Orthopedics;  Laterality: Left;   SHOULDER OPEN ROTATOR CUFF REPAIR Left 07/02/2021   Procedure: ROTATOR CUFF REPAIR SHOULDER OPEN with Patch Graft;  Surgeon: Carole Civil, MD;  Location: [facility];  Service: Orthopedics;  Laterality: Left;   THYROID SURGERY  2002   Patient Active Problem List   Diagnosis Date Noted   Traumatic incomplete tear of left rotator cuff    Intraductal papilloma of right breast 01/05/2021   S/p left rotator cuff  repair distal clavicle resection 07/03/20  07/07/2020   Complete tear of left rotator cuff    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[place]) 07/10/2019   Plasma cell disorder 06/10/2019   Lesion of bone of lumbosacral spine 05/28/2019   Left-sided weakness 03/13/2016   Cerebrovascular accident (CVA) ([place])    Palpitations    COPD (chronic obstructive pulmonary disease) ([place]) 08/20/2014   Dyspnea 12/19/2013   Chronic diastolic CHF (congestive heart failure) ([place]) 07/05/2013   Lower extremity edema 07/05/2013   Hyperlipidemia    Chest pain 07/04/2013   Hypokalemia 09/04/2012   Precordial pain  09/04/2012   Viral meningitis 01/22/2011   PNEUMONIA, LEFT LOWER LOBE 10/10/2006   DISEASE, ACUTE BRONCHOSPASM 10/10/2006   Essential hypertension 05/23/2006   OSTEOARTHRITIS 05/23/2006    ONSET DATE: 07/02/21  REFERRING DIAG: s/p left RCR  THERAPY DIAG:  Other symptoms and signs involving the musculoskeletal system  Stiffness of left shoulder, not elsewhere classified  Acute pain of left shoulder   PERTINENT HISTORY: 07/02/2021  PRECAUTIONS: Shoulder;  Standard RCR protocol -  6-12 weeks: AAROM progressing to AROM  (08/13/21 - 09/24/21) 12-24 weeks: Strengthening exercises   SUBJECTIVE: S: My shoulder feels ok, I'm doing things with it.  PAIN:  Are you having pain? No NPRS scale:  Pain location:  Pain description:  Aggravating factors:  Relieving factors:      OBJECTIVE:  FOTO:  58/100 (30/100 previous)    Assessed supine, er/IR adducted   Passive ROM Left 08/02/2021 Left  08/11/21 Left 09/01/21  Shoulder flexion 77 82 135  Shoulder abduction 62 70 112  Shoulder internal rotation 90 90 90  Shoulder external rotation 26 40 53  (Blank rows = not tested)   Not assessed due to pain/precautions   Active ROM Left 08/02/2021 Left 09/01/2021  Shoulder flexion   112  Shoulder abduction   106  Shoulder internal rotation   90  Shoulder external rotation   62  (Blank rows = not tested)     UE MMT:      Not assessed due to pain/precautions   MMT Left 08/02/2021 Left 09/01/2021  Shoulder flexion   3-/5  Shoulder abduction   3-/5  Shoulder internal rotation   3/5  Shoulder external rotation   3/5  (Blank rows = not tested)     PATIENT EDUCATION: Education details: encouraged continued daily completion of HEP and incorporating LUE into ADLs Person educated: patient Education method: explanation Education comprehension: verbalized understanding     HOME EXERCISE PROGRAM: 4/17: Table slides 5/9: AA/ROM GOALS: Goals reviewed with patient? Yes   SHORT  TERM GOALS: Target date: 08/30/2021   Pt will be provided with and educated on HEP to improve mobility of LUE required for use as dominant during ADL completion.    Goal status: Achieved   2.  Pt will increase LUE P/ROM to [facility] to improve ability to complete dressing and bathing tasks with minimal compensatory strategies.    Goal status: on-going   3.  Pt will increase LUE strength to 3/5 or greater to improve ability to reach for items at waist to chest height during ADLs such as meal preparation and eating.     Goal status: Partially Met         LONG TERM GOALS: Target date: 09/27/2021   Pt will return to highest functional level using LUE as non-dominant during ADL and leisure tasks.    Goal status: on-going   2.  Pt will decrease pain in  LUE to 3/10 or less to improve ability to sleep for 3+ consecutive hours without waking due to pain.    Goal status: on-going   3.  Pt will decrease LUE fascial restrictions to trace amounts to improve mobility required for functional reaching tasks.    Goal status: on-going   4.  Pt will increase LUE A/ROM to [facility] to improve ability to reach overhead and behind back when bathing and dressing.    Goal status: on-going   5.  Pt will increase LUE strength to 4+/5 or greater to improve ability to perform lifting tasks required for caring great grandchildren.    Goal status: on-going    TODAY'S TREATMENT:  09/07/21 - P/ROM: shoulder, supine, flexion, IR/er, abduction, horizontal abduction 5X - AA/ROM: supine-protraction, flexion, horizontal abduction, er/IR, abduction, 12X each -proximal shoulder strengthening in supine: 4 positions, 10X each, no rest breaks  -AA/ROM: sitting-protraction, flexion, er/IR using washcloth, abduction, horizontal abduction, 12X each -Proximal shoulder strengthening in sitting: 4 positions, 10X each, no rest breaks -Proximal shoulder strengthening on doorway using washcloth: 1' flexion -Red scapular theraband:  row, extension, retraction, 10X each -Functional Reaching: Pt placing 10 cones on top of the refrigerator in flexion, removing in abduction    09/01/2021 - P/ROM: shoulder, supine, flexion, IR/er, abduction, horizontal abduction 5X - AA/ROM: seated-protraction, flexion, horizontal abduction, er/IR, abduction, 10X each  -UBE: Level 1' 3' forward 3' reverse, pace: 5.0   08/26/21 - P/ROM: shoulder, supine, flexion, IR/er, abduction, horizontal abduction 5X - AA/ROM: supine-protraction, flexion, horizontal abduction, er/IR, abduction, 12X each  - AA/ROM: standing-protraction, flexion, er/IR, abduction, 12X each - Proximal shoulder strengthening: supine,  12X each no rest breaks       ASSESSMENT:   CLINICAL IMPRESSION: A: Pt reports her shoulder is beginning to feel better and she is able to use it to do some things around the house. Pt arrived late for session, therefore no manual techniques completed today. Continued with passive stretching with pt tolerating approximately 75% ROM. AA/ROM completed in supine and sitting. Added proximal shoulder strengthening in supine, also added scapular theraband strengthening. Added functional reaching task today. Rest breaks provided as needed for fatigue, verbal cuing for form and technique.    PLAN:   OT FREQUENCY: 2x/week  OT DURATION: 8 weeks  PLANNED INTERVENTIONS: self care/ADL training, therapeutic exercise, therapeutic activity, neuromuscular re-education, manual therapy, passive range of motion, electrical stimulation, ultrasound, moist heat, cryotherapy, patient/family education, and DME and/or AE instructions    CONSULTED AND AGREED WITH PLAN OF CARE: Patient  PLAN FOR NEXT SESSION: P:  Begin progressing to A/ROM supine. Continue with scapular theraband and add to HEP if pt completing with good form     Guadelupe Sabin, OTR/L  [phone] 09/07/2021, 10:30 AM

## 2021-09-09 ENCOUNTER — Other Ambulatory Visit: Payer: Self-pay | Admitting: Radiation Oncology

## 2021-09-09 ENCOUNTER — Ambulatory Visit (HOSPITAL_COMMUNITY): Payer: 59 | Admitting: Occupational Therapy

## 2021-09-09 ENCOUNTER — Telehealth: Payer: Self-pay

## 2021-09-09 ENCOUNTER — Encounter (HOSPITAL_COMMUNITY): Payer: Self-pay | Admitting: Occupational Therapy

## 2021-09-09 DIAGNOSIS — R29898 Other symptoms and signs involving the musculoskeletal system: Secondary | ICD-10-CM

## 2021-09-09 DIAGNOSIS — M25512 Pain in left shoulder: Secondary | ICD-10-CM

## 2021-09-09 DIAGNOSIS — M25612 Stiffness of left shoulder, not elsewhere classified: Secondary | ICD-10-CM

## 2021-09-09 DIAGNOSIS — G8918 Other acute postprocedural pain: Secondary | ICD-10-CM

## 2021-09-09 MED ORDER — HYDROCODONE-ACETAMINOPHEN 10-325 MG PO TABS: 1.0000 | ORAL_TABLET | Freq: Four times a day (QID) | ORAL | 0 refills | Status: DC | PRN

## 2021-09-09 NOTE — Therapy (Signed)
OUTPATIENT OCCUPATIONAL THERAPY TREATMENT NOTE   Patient Name: Hannah Cooper MRN: [id number] DOB:1955/08/07, 66 y.o., female Today's Date: 09/09/2021  PCP: Jake Samples, PA-C REFERRING PROVIDER: Carole Civil, MD   END OF SESSION:   OT End of Session - 09/09/21 1345     Visit Number 11    Number of Visits 16    Date for OT Re-Evaluation 10/01/21    Authorization Type 1) [employer], $20 copay 2) Generic [employer]    Authorization Time Period [employer]-60 visit limit combined (PT/OT/ST), 3 used on eval    Authorization - Visit Number 14    Authorization - Number of Visits 60    Progress Note Due on Visit 19    OT Start Time 1305    OT Stop Time 1344    OT Time Calculation (min) 39 min    Activity Tolerance Patient tolerated treatment well    Behavior During Therapy [facility] for tasks assessed/performed                     Progress Note Reporting Period 08/02/2021 to 09/01/2021  See note below for Objective Data and Assessment of Progress/Goals.     Past Medical History:  Diagnosis Date   Arthritis    Atypical chest pain    chronic   Cancer (HCC)    bone cancer   Chronic back pain    Chronic diastolic CHF (congestive heart failure) (HCC)    COPD (chronic obstructive pulmonary disease) (HCC)    DDD (degenerative disc disease), cervical    Diabetes mellitus without complication ([place])    History of radiation therapy 07/16/19-08/22/19   L4 spine ; Dr. Gery Pray   Hyperlipidemia    Hypertension    Lumbar radiculopathy    Meningitis    Normal coronary arteries 2014   Palpitations    Premature atrial contractions    PVC's (premature ventricular contractions)    Past Surgical History:  Procedure Laterality Date   ABDOMINAL HYSTERECTOMY     BREAST LUMPECTOMY WITH RADIOFREQUENCY TAG IDENTIFICATION Right 01/25/2021   Procedure: BREAST LUMPECTOMY WITH RADIOFREQUENCY TAG IDENTIFICATION;  Surgeon: Virl Cagey, MD;  Location: [facility];   Service: General;  Laterality: Right;   EXCISION OF BREAST BIOPSY Right 01/25/2021   Procedure: EXCISION OF BREAST BIOPSY;  Surgeon: Virl Cagey, MD;  Location: [facility];  Service: General;  Laterality: Right;   IR FLUORO GUIDED NEEDLE PLC ASPIRATION/INJECTION LOC  06/19/2019   LEFT HEART CATHETERIZATION WITH CORONARY ANGIOGRAM N/A 09/05/2012   Procedure: LEFT HEART CATHETERIZATION WITH CORONARY ANGIOGRAM;  Surgeon: Wellington Hampshire, MD;  Location: [facility];  Service: Cardiovascular;  Laterality: N/A;   RESECTION DISTAL CLAVICAL Left 07/03/2020   Procedure: RESECTION DISTAL CLAVICAL;  Surgeon: Carole Civil, MD;  Location: [facility];  Service: Orthopedics;  Laterality: Left;   SHOULDER OPEN ROTATOR CUFF REPAIR Left 07/03/2020   Procedure: ROTATOR CUFF REPAIR SHOULDER OPEN WITH CHROMEOPLASTY;  Surgeon: Carole Civil, MD;  Location: [facility];  Service: Orthopedics;  Laterality: Left;   SHOULDER OPEN ROTATOR CUFF REPAIR Left 07/02/2021   Procedure: ROTATOR CUFF REPAIR SHOULDER OPEN with Patch Graft;  Surgeon: Carole Civil, MD;  Location: [facility];  Service: Orthopedics;  Laterality: Left;   THYROID SURGERY  2002   Patient Active Problem List   Diagnosis Date Noted   Traumatic incomplete tear of left rotator cuff    Intraductal papilloma of right breast 01/05/2021   S/p left rotator  cuff repair distal clavicle resection 07/03/20  07/07/2020   Complete tear of left rotator cuff    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[place]) 07/10/2019   Plasma cell disorder 06/10/2019   Lesion of bone of lumbosacral spine 05/28/2019   Left-sided weakness 03/13/2016   Cerebrovascular accident (CVA) ([place])    Palpitations    COPD (chronic obstructive pulmonary disease) ([place]) 08/20/2014   Dyspnea 12/19/2013   Chronic diastolic CHF (congestive heart failure) ([place]) 07/05/2013   Lower extremity edema 07/05/2013   Hyperlipidemia    Chest pain 07/04/2013   Hypokalemia 09/04/2012   Precordial pain  09/04/2012   Viral meningitis 01/22/2011   PNEUMONIA, LEFT LOWER LOBE 10/10/2006   DISEASE, ACUTE BRONCHOSPASM 10/10/2006   Essential hypertension 05/23/2006   OSTEOARTHRITIS 05/23/2006    ONSET DATE: 07/02/21  REFERRING DIAG: s/p left RCR  THERAPY DIAG:  Other symptoms and signs involving the musculoskeletal system  Stiffness of left shoulder, not elsewhere classified  Acute pain of left shoulder   PERTINENT HISTORY: 07/02/2021  PRECAUTIONS: Shoulder;  Standard RCR protocol -  6-12 weeks: AAROM progressing to AROM  (08/13/21 - 09/24/21) 12-24 weeks: Strengthening exercises   SUBJECTIVE: S: My shoulder is feeling pretty good.   PAIN:  Are you having pain? No NPRS scale:  Pain location:  Pain description:  Aggravating factors:  Relieving factors:      OBJECTIVE:  FOTO:  58/100 (30/100 previous)    Assessed supine, er/IR adducted   Passive ROM Left 08/02/2021 Left  08/11/21 Left 09/01/21  Shoulder flexion 77 82 135  Shoulder abduction 62 70 112  Shoulder internal rotation 90 90 90  Shoulder external rotation 26 40 53  (Blank rows = not tested)   Not assessed due to pain/precautions   Active ROM Left 08/02/2021 Left 09/01/2021  Shoulder flexion   112  Shoulder abduction   106  Shoulder internal rotation   90  Shoulder external rotation   62  (Blank rows = not tested)     UE MMT:      Not assessed due to pain/precautions   MMT Left 08/02/2021 Left 09/01/2021  Shoulder flexion   3-/5  Shoulder abduction   3-/5  Shoulder internal rotation   3/5  Shoulder external rotation   3/5  (Blank rows = not tested)     GOALS: Goals reviewed with patient? Yes   SHORT TERM GOALS: Target date: 08/30/2021   Pt will be provided with and educated on HEP to improve mobility of LUE required for use as dominant during ADL completion.    Goal status: Achieved   2.  Pt will increase LUE P/ROM to [facility] to improve ability to complete dressing and bathing tasks with  minimal compensatory strategies.    Goal status: on-going   3.  Pt will increase LUE strength to 3/5 or greater to improve ability to reach for items at waist to chest height during ADLs such as meal preparation and eating.     Goal status: Partially Met         LONG TERM GOALS: Target date: 09/27/2021   Pt will return to highest functional level using LUE as non-dominant during ADL and leisure tasks.    Goal status: on-going   2.  Pt will decrease pain in LUE to 3/10 or less to improve ability to sleep for 3+ consecutive hours without waking due to pain.    Goal status: on-going   3.  Pt will decrease LUE fascial restrictions to trace amounts  to improve mobility required for functional reaching tasks.    Goal status: on-going   4.  Pt will increase LUE A/ROM to [facility] to improve ability to reach overhead and behind back when bathing and dressing.    Goal status: on-going   5.  Pt will increase LUE strength to 4+/5 or greater to improve ability to perform lifting tasks required for caring great grandchildren.    Goal status: on-going    TODAY'S TREATMENT:  09/09/21 - P/ROM: shoulder, supine, flexion, IR/er, abduction, horizontal abduction 5X - A/ROM: supine-protraction, flexion, horizontal abduction, er/IR, abduction, 10X each  -Proximal shoulder strengthening in supine: 4 positions, 10X each, no rest breaks  -A/ROM: sitting-protraction, flexion, er/IR using washcloth, abduction, horizontal abduction, 10X each -Proximal shoulder strengthening in sitting: 4 positions, 10X each, no rest breaks -Scapular theraband: red, row, retraction, extension, 10X  -Overhead lacing: lacing from top down then reversing -Functional reaching: pt removing items from top shelf of kitchen cabinet, moving items from middle shelf to top shelf, then replacing original items onto middle shelf.  -UBE: level 1, 2' forward 2' reverse, pac: 3.5   09/07/21 - P/ROM: shoulder, supine, flexion, IR/er,  abduction, horizontal abduction 5X - AA/ROM: supine-protraction, flexion, horizontal abduction, er/IR, abduction, 12X each -proximal shoulder strengthening in supine: 4 positions, 10X each, no rest breaks  -AA/ROM: sitting-protraction, flexion, er/IR using washcloth, abduction, horizontal abduction, 12X each -Proximal shoulder strengthening in sitting: 4 positions, 10X each, no rest breaks -Proximal shoulder strengthening on doorway using washcloth: 1' flexion -Red scapular theraband: row, extension, retraction, 10X each -Functional Reaching: Pt placing 10 cones on top of the refrigerator in flexion, removing in abduction    09/01/2021 - P/ROM: shoulder, supine, flexion, IR/er, abduction, horizontal abduction 5X - AA/ROM: seated-protraction, flexion, horizontal abduction, er/IR, abduction, 10X each  -UBE: Level 1' 3' forward 3' reverse, pace: 5.0    PATIENT EDUCATION: Education details: red scapular theraband Person educated: patient Education method: explanation, demonstration Education comprehension: verbalized understanding, returned demonstration     HOME EXERCISE PROGRAM: 4/17: Table slides 5/9: AA/ROM; 5/25: red scapular theraband     ASSESSMENT:   CLINICAL IMPRESSION: A: Pt reports her shoulder is feeling pretty good. Passive stretching completed prior to beginning exercises, progressed to A/ROM in supine and sitting today. Continued with proximal shoulder strengthening and functional reaching. Added scapular theraband exercises and added to HEP. Added overhead lacing and continued UBE. Verbal cuing for form and technique during exercises. Mod fatigue during functional reaching task.    PLAN:   OT FREQUENCY: 2x/week  OT DURATION: 8 weeks  PLANNED INTERVENTIONS: self care/ADL training, therapeutic exercise, therapeutic activity, neuromuscular re-education, manual therapy, passive range of motion, electrical stimulation, ultrasound, moist heat, cryotherapy, patient/family  education, and DME and/or AE instructions    CONSULTED AND AGREED WITH PLAN OF CARE: Patient  PLAN FOR NEXT SESSION: P:  Follow up on HEP, continue with A/ROM and update HEP     Guadelupe Sabin, OTR/L  [phone] 09/09/2021, 1:45 PM

## 2021-09-09 NOTE — Telephone Encounter (Signed)
Received call from patient requesting refill on Hydrocodone- APAP 10-'325mg'$ . Patient requesting medication be sent to [facility].

## 2021-09-09 NOTE — Patient Instructions (Signed)

## 2021-09-10 NOTE — Telephone Encounter (Signed)
Called to make patient aware that Dr. Sondra Come refilled prescription for Hydrocodone-APAP, however moving forward the patient should have her PCP mange pain medications. Patient voiced understanding.

## 2021-09-14 ENCOUNTER — Ambulatory Visit (HOSPITAL_COMMUNITY): Payer: 59 | Attending: Family Medicine | Admitting: Occupational Therapy

## 2021-09-14 ENCOUNTER — Encounter (HOSPITAL_COMMUNITY): Payer: Self-pay | Admitting: Occupational Therapy

## 2021-09-14 DIAGNOSIS — M25612 Stiffness of left shoulder, not elsewhere classified: Secondary | ICD-10-CM

## 2021-09-14 DIAGNOSIS — M75122 Complete rotator cuff tear or rupture of left shoulder, not specified as traumatic: Secondary | ICD-10-CM | POA: Insufficient documentation

## 2021-09-14 DIAGNOSIS — R29898 Other symptoms and signs involving the musculoskeletal system: Secondary | ICD-10-CM

## 2021-09-14 DIAGNOSIS — M25512 Pain in left shoulder: Secondary | ICD-10-CM

## 2021-09-14 DIAGNOSIS — Z9889 Other specified postprocedural states: Secondary | ICD-10-CM | POA: Insufficient documentation

## 2021-09-14 NOTE — Patient Instructions (Signed)

## 2021-09-14 NOTE — Therapy (Signed)
OUTPATIENT OCCUPATIONAL THERAPY TREATMENT NOTE   Patient Name: Hannah Cooper MRN: [id number] DOB:08-23-1955, 66 y.o., female Today's Date: 09/14/2021  PCP: Jake Samples, PA-C REFERRING PROVIDER: Carole Civil, MD   END OF SESSION:   OT End of Session - 09/14/21 1207     Visit Number 12    Number of Visits 16    Date for OT Re-Evaluation 10/01/21    Authorization Type 1) [employer], $20 copay 2) Generic [employer]    Authorization Time Period [employer]-60 visit limit combined (PT/OT/ST), 3 used on eval    Authorization - Visit Number 15    Authorization - Number of Visits 60    Progress Note Due on Visit 32    OT Start Time 1128    OT Stop Time 1201    OT Time Calculation (min) 33 min    Activity Tolerance Patient tolerated treatment well    Behavior During Therapy WFL for tasks assessed/performed                  Past Medical History:  Diagnosis Date   Arthritis    Atypical chest pain    chronic   Cancer ([place])    bone cancer   Chronic back pain    Chronic diastolic CHF (congestive heart failure) (HCC)    COPD (chronic obstructive pulmonary disease) ([place])    DDD (degenerative disc disease), cervical    Diabetes mellitus without complication ([place])    History of radiation therapy 07/16/19-08/22/19   L4 spine ; Dr. Gery Pray   Hyperlipidemia    Hypertension    Lumbar radiculopathy    Meningitis    Normal coronary arteries 2014   Palpitations    Premature atrial contractions    PVC's (premature ventricular contractions)    Past Surgical History:  Procedure Laterality Date   ABDOMINAL HYSTERECTOMY     BREAST LUMPECTOMY WITH RADIOFREQUENCY TAG IDENTIFICATION Right 01/25/2021   Procedure: BREAST LUMPECTOMY WITH RADIOFREQUENCY TAG IDENTIFICATION;  Surgeon: Virl Cagey, MD;  Location: [facility];  Service: General;  Laterality: Right;   EXCISION OF BREAST BIOPSY Right 01/25/2021   Procedure: EXCISION OF BREAST BIOPSY;  Surgeon: Virl Cagey, MD;  Location: [facility];  Service: General;  Laterality: Right;   IR FLUORO GUIDED NEEDLE PLC ASPIRATION/INJECTION LOC  06/19/2019   LEFT HEART CATHETERIZATION WITH CORONARY ANGIOGRAM N/A 09/05/2012   Procedure: LEFT HEART CATHETERIZATION WITH CORONARY ANGIOGRAM;  Surgeon: Wellington Hampshire, MD;  Location: [facility];  Service: Cardiovascular;  Laterality: N/A;   RESECTION DISTAL CLAVICAL Left 07/03/2020   Procedure: RESECTION DISTAL CLAVICAL;  Surgeon: Carole Civil, MD;  Location: [facility];  Service: Orthopedics;  Laterality: Left;   SHOULDER OPEN ROTATOR CUFF REPAIR Left 07/03/2020   Procedure: ROTATOR CUFF REPAIR SHOULDER OPEN WITH CHROMEOPLASTY;  Surgeon: Carole Civil, MD;  Location: [facility];  Service: Orthopedics;  Laterality: Left;   SHOULDER OPEN ROTATOR CUFF REPAIR Left 07/02/2021   Procedure: ROTATOR CUFF REPAIR SHOULDER OPEN with Patch Graft;  Surgeon: Carole Civil, MD;  Location: [facility];  Service: Orthopedics;  Laterality: Left;   THYROID SURGERY  2002   Patient Active Problem List   Diagnosis Date Noted   Traumatic incomplete tear of left rotator cuff    Intraductal papilloma of right breast 01/05/2021   S/p left rotator cuff repair distal clavicle resection 07/03/20  07/07/2020   Complete tear of left rotator cuff    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[place])  07/10/2019   Plasma cell disorder 06/10/2019   Lesion of bone of lumbosacral spine 05/28/2019   Left-sided weakness 03/13/2016   Cerebrovascular accident (CVA) ([place])    Palpitations    COPD (chronic obstructive pulmonary disease) ([place]) 08/20/2014   Dyspnea 12/19/2013   Chronic diastolic CHF (congestive heart failure) ([place]) 07/05/2013   Lower extremity edema 07/05/2013   Hyperlipidemia    Chest pain 07/04/2013   Hypokalemia 09/04/2012   Precordial pain 09/04/2012   Viral meningitis 01/22/2011   PNEUMONIA, LEFT LOWER LOBE 10/10/2006   DISEASE, ACUTE BRONCHOSPASM 10/10/2006   Essential  hypertension 05/23/2006   OSTEOARTHRITIS 05/23/2006    ONSET DATE: 07/02/21  REFERRING DIAG: s/p left RCR  THERAPY DIAG:  Other symptoms and signs involving the musculoskeletal system  Stiffness of left shoulder, not elsewhere classified  Acute pain of left shoulder   PERTINENT HISTORY: 07/02/2021  PRECAUTIONS: Shoulder;  Standard RCR protocol -  6-12 weeks: AAROM progressing to AROM  (08/13/21 - 09/24/21) 12-24 weeks: Strengthening exercises   Rationale for Evaluation and Treatment Rehabilitation   SUBJECTIVE: S: I've been exercising.   PAIN:  Are you having pain? No NPRS scale:  Pain location:  Pain description:  Aggravating factors:  Relieving factors:      OBJECTIVE:  FOTO:  58/100 (30/100 previous)    Assessed supine, er/IR adducted   Passive ROM Left 08/02/2021 Left  08/11/21 Left 09/01/21  Shoulder flexion 77 82 135  Shoulder abduction 62 70 112  Shoulder internal rotation 90 90 90  Shoulder external rotation 26 40 53  (Blank rows = not tested)   Not assessed due to pain/precautions   Active ROM Left 08/02/2021 Left 09/01/2021  Shoulder flexion   112  Shoulder abduction   106  Shoulder internal rotation   90  Shoulder external rotation   62  (Blank rows = not tested)     UE MMT:      Not assessed due to pain/precautions   MMT Left 08/02/2021 Left 09/01/2021  Shoulder flexion   3-/5  Shoulder abduction   3-/5  Shoulder internal rotation   3/5  Shoulder external rotation   3/5  (Blank rows = not tested)     GOALS: Goals reviewed with patient? Yes   SHORT TERM GOALS: Target date: 08/30/2021   Pt will be provided with and educated on HEP to improve mobility of LUE required for use as dominant during ADL completion.    Goal status: Achieved   2.  Pt will increase LUE P/ROM to [facility] to improve ability to complete dressing and bathing tasks with minimal compensatory strategies.    Goal status: on-going   3.  Pt will increase LUE  strength to 3/5 or greater to improve ability to reach for items at waist to chest height during ADLs such as meal preparation and eating.     Goal status: Partially Met         LONG TERM GOALS: Target date: 09/27/2021   Pt will return to highest functional level using LUE as non-dominant during ADL and leisure tasks.    Goal status: on-going   2.  Pt will decrease pain in LUE to 3/10 or less to improve ability to sleep for 3+ consecutive hours without waking due to pain.    Goal status: on-going   3.  Pt will decrease LUE fascial restrictions to trace amounts to improve mobility required for functional reaching tasks.    Goal status: on-going   4.  Pt will  increase LUE A/ROM to [facility] to improve ability to reach overhead and behind back when bathing and dressing.    Goal status: on-going   5.  Pt will increase LUE strength to 4+/5 or greater to improve ability to perform lifting tasks required for caring great grandchildren.    Goal status: on-going    TODAY'S TREATMENT:  09/14/21 - P/ROM: shoulder, supine, flexion, IR/er, abduction, horizontal abduction 5X - A/ROM: supine-protraction, flexion, horizontal abduction, er/IR, abduction, 12X each   -Proximal shoulder strengthening in supine: 4 positions, 10X each, no rest breaks -A/ROM: standing-protraction, flexion, er/IR using washcloth, abduction, horizontal abduction, 12X each -X to V arms: 10X each -ball on wall: 1' flexion, 1' abduction -Overhead lacing: lacing from top down and then bottom up -Rolling green therapy ball up the wall and then bringing back down, 5X, working on flexion stretch   09/09/21 - P/ROM: shoulder, supine, flexion, IR/er, abduction, horizontal abduction 5X - A/ROM: supine-protraction, flexion, horizontal abduction, er/IR, abduction, 10X each  -Proximal shoulder strengthening in supine: 4 positions, 10X each, no rest breaks  -A/ROM: sitting-protraction, flexion, er/IR using washcloth, abduction,  horizontal abduction, 10X each -Proximal shoulder strengthening in sitting: 4 positions, 10X each, no rest breaks -Scapular theraband: red, row, retraction, extension, 10X  -Overhead lacing: lacing from top down then reversing -Functional reaching: pt removing items from top shelf of kitchen cabinet, moving items from middle shelf to top shelf, then replacing original items onto middle shelf.  -UBE: level 1, 2' forward 2' reverse, pac: 3.5   09/07/21 - P/ROM: shoulder, supine, flexion, IR/er, abduction, horizontal abduction 5X - AA/ROM: supine-protraction, flexion, horizontal abduction, er/IR, abduction, 12X each -proximal shoulder strengthening in supine: 4 positions, 10X each, no rest breaks  -AA/ROM: sitting-protraction, flexion, er/IR using washcloth, abduction, horizontal abduction, 12X each -Proximal shoulder strengthening in sitting: 4 positions, 10X each, no rest breaks -Proximal shoulder strengthening on doorway using washcloth: 1' flexion -Red scapular theraband: row, extension, retraction, 10X each -Functional Reaching: Pt placing 10 cones on top of the refrigerator in flexion, removing in abduction       PATIENT EDUCATION: Education details:A/ROM  Person educated: patient Education method: explanation, demonstration Education comprehension: verbalized understanding, returned demonstration     HOME EXERCISE PROGRAM: 4/17: Table slides 5/9: AA/ROM; 5/25: red scapular theraband; 5/30: A/ROM     ASSESSMENT:   CLINICAL IMPRESSION: A: Pt reports she is feeling good, has been completing her HEP. Continued with passive stretching and A/ROM, increasing A/ROM repetitions to 12 in both supine and standing. Added x to v arms and ball on the wall today. Continued with activity tolerance work, added ball rolling flexion stretch. Verbal cuing for form and technique during exercises.    PLAN:   OT FREQUENCY: 2x/week  OT DURATION: 8 weeks  PLANNED INTERVENTIONS: self care/ADL  training, therapeutic exercise, therapeutic activity, neuromuscular re-education, manual therapy, passive range of motion, electrical stimulation, ultrasound, moist heat, cryotherapy, patient/family education, and DME and/or AE instructions    CONSULTED AND AGREED WITH PLAN OF CARE: Patient  PLAN FOR NEXT SESSION: P:  Follow up on HEP, continue with A/ROM-attempt in sidelying for scapular stability     Guadelupe Sabin, OTR/L  [phone] 09/14/2021, 12:07 PM

## 2021-09-16 ENCOUNTER — Encounter (HOSPITAL_COMMUNITY): Payer: Self-pay | Admitting: Occupational Therapy

## 2021-09-16 ENCOUNTER — Ambulatory Visit (HOSPITAL_COMMUNITY): Payer: 59 | Attending: Orthopedic Surgery | Admitting: Occupational Therapy

## 2021-09-16 DIAGNOSIS — M25512 Pain in left shoulder: Secondary | ICD-10-CM | POA: Insufficient documentation

## 2021-09-16 DIAGNOSIS — M25612 Stiffness of left shoulder, not elsewhere classified: Secondary | ICD-10-CM | POA: Insufficient documentation

## 2021-09-16 DIAGNOSIS — R29898 Other symptoms and signs involving the musculoskeletal system: Secondary | ICD-10-CM | POA: Insufficient documentation

## 2021-09-16 NOTE — Therapy (Signed)
OUTPATIENT OCCUPATIONAL THERAPY TREATMENT NOTE   Patient Name: Hannah Cooper MRN: [id number] DOB:1955-08-22, 66 y.o., female Today's Date: 09/16/2021  PCP: Jake Samples, PA-C REFERRING PROVIDER: Carole Civil, MD   END OF SESSION:   OT End of Session - 09/16/21 1201     Visit Number 13    Number of Visits 16    Date for OT Re-Evaluation 10/01/21    Authorization Type 1) [employer], $20 copay 2) Generic [employer]    Authorization Time Period [employer]-60 visit limit combined (PT/OT/ST), 3 used on eval    Authorization - Visit Number 16    Authorization - Number of Visits 60    Progress Note Due on Visit 19    OT Start Time 1125   pt arrived late   OT Stop Time 1200    OT Time Calculation (min) 35 min    Activity Tolerance Patient tolerated treatment well    Behavior During Therapy WFL for tasks assessed/performed                   Past Medical History:  Diagnosis Date   Arthritis    Atypical chest pain    chronic   Cancer ([place])    bone cancer   Chronic back pain    Chronic diastolic CHF (congestive heart failure) (HCC)    COPD (chronic obstructive pulmonary disease) ([place])    DDD (degenerative disc disease), cervical    Diabetes mellitus without complication ([place])    History of radiation therapy 07/16/19-08/22/19   L4 spine ; Dr. Gery Pray   Hyperlipidemia    Hypertension    Lumbar radiculopathy    Meningitis    Normal coronary arteries 2014   Palpitations    Premature atrial contractions    PVC's (premature ventricular contractions)    Past Surgical History:  Procedure Laterality Date   ABDOMINAL HYSTERECTOMY     BREAST LUMPECTOMY WITH RADIOFREQUENCY TAG IDENTIFICATION Right 01/25/2021   Procedure: BREAST LUMPECTOMY WITH RADIOFREQUENCY TAG IDENTIFICATION;  Surgeon: Virl Cagey, MD;  Location: [facility];  Service: General;  Laterality: Right;   EXCISION OF BREAST BIOPSY Right 01/25/2021   Procedure: EXCISION OF BREAST BIOPSY;   Surgeon: Virl Cagey, MD;  Location: [facility];  Service: General;  Laterality: Right;   IR FLUORO GUIDED NEEDLE PLC ASPIRATION/INJECTION LOC  06/19/2019   LEFT HEART CATHETERIZATION WITH CORONARY ANGIOGRAM N/A 09/05/2012   Procedure: LEFT HEART CATHETERIZATION WITH CORONARY ANGIOGRAM;  Surgeon: Wellington Hampshire, MD;  Location: [place] CATH LAB;  Service: Cardiovascular;  Laterality: N/A;   RESECTION DISTAL CLAVICAL Left 07/03/2020   Procedure: RESECTION DISTAL CLAVICAL;  Surgeon: Carole Civil, MD;  Location: [facility];  Service: Orthopedics;  Laterality: Left;   SHOULDER OPEN ROTATOR CUFF REPAIR Left 07/03/2020   Procedure: ROTATOR CUFF REPAIR SHOULDER OPEN WITH CHROMEOPLASTY;  Surgeon: Carole Civil, MD;  Location: [facility];  Service: Orthopedics;  Laterality: Left;   SHOULDER OPEN ROTATOR CUFF REPAIR Left 07/02/2021   Procedure: ROTATOR CUFF REPAIR SHOULDER OPEN with Patch Graft;  Surgeon: Carole Civil, MD;  Location: [facility];  Service: Orthopedics;  Laterality: Left;   THYROID SURGERY  2002   Patient Active Problem List   Diagnosis Date Noted   Traumatic incomplete tear of left rotator cuff    Intraductal papilloma of right breast 01/05/2021   S/p left rotator cuff repair distal clavicle resection 07/03/20  07/07/2020   Complete tear of left rotator cuff    Plasmacytoma  not having achieved remission ([place]) 07/10/2019   Plasma cell disorder 06/10/2019   Lesion of bone of lumbosacral spine 05/28/2019   Left-sided weakness 03/13/2016   Cerebrovascular accident (CVA) ([place])    Palpitations    COPD (chronic obstructive pulmonary disease) ([place]) 08/20/2014   Dyspnea 12/19/2013   Chronic diastolic CHF (congestive heart failure) ([place]) 07/05/2013   Lower extremity edema 07/05/2013   Hyperlipidemia    Chest pain 07/04/2013   Hypokalemia 09/04/2012   Precordial pain 09/04/2012   Viral meningitis 01/22/2011   PNEUMONIA, LEFT LOWER LOBE 10/10/2006   DISEASE, ACUTE BRONCHOSPASM 10/10/2006    Essential hypertension 05/23/2006   OSTEOARTHRITIS 05/23/2006    ONSET DATE: 07/02/21  REFERRING DIAG: s/p left RCR  THERAPY DIAG:  Other symptoms and signs involving the musculoskeletal system  Stiffness of left shoulder, not elsewhere classified  Acute pain of left shoulder   PERTINENT HISTORY: 07/02/2021  PRECAUTIONS: Shoulder;  Standard RCR protocol -  6-12 weeks: AAROM progressing to AROM  (08/13/21 - 09/24/21) 12-24 weeks: Strengthening exercises   Rationale for Evaluation and Treatment Rehabilitation   SUBJECTIVE: S: I'm doing alright today, no pain.   PAIN:  Are you having pain? No NPRS scale:  Pain location:  Pain description:  Aggravating factors:  Relieving factors:      OBJECTIVE:  FOTO:  58/100 (30/100 previous)    Assessed supine, er/IR adducted   Passive ROM Left 08/02/2021 Left  08/11/21 Left 09/01/21  Shoulder flexion 77 82 135  Shoulder abduction 62 70 112  Shoulder internal rotation 90 90 90  Shoulder external rotation 26 40 53  (Blank rows = not tested)   Not assessed due to pain/precautions   Active ROM Left 08/02/2021 Left 09/01/2021  Shoulder flexion   112  Shoulder abduction   106  Shoulder internal rotation   90  Shoulder external rotation   62  (Blank rows = not tested)     UE MMT:      Not assessed due to pain/precautions   MMT Left 08/02/2021 Left 09/01/2021  Shoulder flexion   3-/5  Shoulder abduction   3-/5  Shoulder internal rotation   3/5  Shoulder external rotation   3/5  (Blank rows = not tested)     GOALS: Goals reviewed with patient? Yes   SHORT TERM GOALS: Target date: 08/30/2021   Pt will be provided with and educated on HEP to improve mobility of LUE required for use as dominant during ADL completion.    Goal status: Achieved   2.  Pt will increase LUE P/ROM to [facility] to improve ability to complete dressing and bathing tasks with minimal compensatory strategies.    Goal status: on-going   3.  Pt  will increase LUE strength to 3/5 or greater to improve ability to reach for items at waist to chest height during ADLs such as meal preparation and eating.     Goal status: Partially Met         LONG TERM GOALS: Target date: 09/27/2021   Pt will return to highest functional level using LUE as non-dominant during ADL and leisure tasks.    Goal status: on-going   2.  Pt will decrease pain in LUE to 3/10 or less to improve ability to sleep for 3+ consecutive hours without waking due to pain.    Goal status: on-going   3.  Pt will decrease LUE fascial restrictions to trace amounts to improve mobility required for functional reaching tasks.    Goal  status: on-going   4.  Pt will increase LUE A/ROM to [facility] to improve ability to reach overhead and behind back when bathing and dressing.    Goal status: on-going   5.  Pt will increase LUE strength to 4+/5 or greater to improve ability to perform lifting tasks required for caring great grandchildren.    Goal status: on-going    TODAY'S TREATMENT:  09/16/21  - P/ROM: shoulder, supine, flexion, IR/er, abduction, horizontal abduction 5X  -A/ROM: Sidelying, shoulder protraction, flexion, er/IR, horizontal abduction, abduction, 10X each -A/ROM: standing-protraction, flexion, er/IR using washcloth, abduction, horizontal abduction, 12X each -X to V arms: 12X each -Proximal shoulder strengthening: standing, 4 positions, 12X each, no rest breaks -Ball on wall: 1' flexion 1' abduction, green ball  -Functional reaching: pt removing items from top shelf of kitchen cabinet, moving items from bottom shelf to top shelf, then replacing original items onto bottom shelf.  -Arms on fire: 2 minutes total, 4 positions, 15" each, 2 rest breaks with hands on top of head -Y lift off: 10X   09/14/21 - P/ROM: shoulder, supine, flexion, IR/er, abduction, horizontal abduction 5X - A/ROM: supine-protraction, flexion, horizontal abduction, er/IR, abduction, 12X  each   -Proximal shoulder strengthening in supine: 4 positions, 10X each, no rest breaks -A/ROM: standing-protraction, flexion, er/IR using washcloth, abduction, horizontal abduction, 12X each -X to V arms: 10X each -ball on wall: 1' flexion, 1' abduction -Overhead lacing: lacing from top down and then bottom up -Rolling green therapy ball up the wall and then bringing back down, 5X, working on flexion stretch   09/09/21 - P/ROM: shoulder, supine, flexion, IR/er, abduction, horizontal abduction 5X - A/ROM: supine-protraction, flexion, horizontal abduction, er/IR, abduction, 10X each  -Proximal shoulder strengthening in supine: 4 positions, 10X each, no rest breaks  -A/ROM: sitting-protraction, flexion, er/IR using washcloth, abduction, horizontal abduction, 10X each -Proximal shoulder strengthening in sitting: 4 positions, 10X each, no rest breaks -Scapular theraband: red, row, retraction, extension, 10X  -Overhead lacing: lacing from top down then reversing -Functional reaching: pt removing items from top shelf of kitchen cabinet, moving items from middle shelf to top shelf, then replacing original items onto middle shelf.  -UBE: level 1, 2' forward 2' reverse, pac: 3.5     PATIENT EDUCATION: Education details:A/ROM  Person educated: patient Education method: explanation, demonstration Education comprehension: verbalized understanding, returned demonstration     HOME EXERCISE PROGRAM: 4/17: Table slides 5/9: AA/ROM; 5/25: red scapular theraband; 5/30: A/ROM     ASSESSMENT:   CLINICAL IMPRESSION: A: Pt reports she is feeling good. Added A/ROM in sidelying working on scapular stability. Pt achieving ROM WFL during task. Continued with A/ROM in standing, x to v arms, and ball on wall. Continued with functional reaching, added activity tolerance work with arms on fire today.  Pt with mod difficulty, rest breaks provided as needed. Verbal cuing for form and technique during exercises.     PLAN:   OT FREQUENCY: 2x/week  OT DURATION: 8 weeks  PLANNED INTERVENTIONS: self care/ADL training, therapeutic exercise, therapeutic activity, neuromuscular re-education, manual therapy, passive range of motion, electrical stimulation, ultrasound, moist heat, cryotherapy, patient/family education, and DME and/or AE instructions    CONSULTED AND AGREED WITH PLAN OF CARE: Patient  PLAN FOR NEXT SESSION: P: Continue with activity tolerance work, progress to green band for scapular strengthening     Guadelupe Sabin, OTR/L  [phone] 09/16/2021, 12:01 PM

## 2021-09-17 ENCOUNTER — Encounter: Payer: 59 | Admitting: Orthopedic Surgery

## 2021-09-20 ENCOUNTER — Ambulatory Visit (INDEPENDENT_AMBULATORY_CARE_PROVIDER_SITE_OTHER): Payer: HMO

## 2021-09-20 ENCOUNTER — Ambulatory Visit (INDEPENDENT_AMBULATORY_CARE_PROVIDER_SITE_OTHER): Payer: 59 | Admitting: Orthopedic Surgery

## 2021-09-20 ENCOUNTER — Encounter: Payer: Self-pay | Admitting: Orthopedic Surgery

## 2021-09-20 VITALS — BP 136/76 | HR 74 | Ht 65.0 in | Wt 229.0 lb

## 2021-09-20 DIAGNOSIS — M545 Low back pain, unspecified: Secondary | ICD-10-CM

## 2021-09-20 DIAGNOSIS — M25551 Pain in right hip: Secondary | ICD-10-CM

## 2021-09-20 DIAGNOSIS — M51369 Other intervertebral disc degeneration, lumbar region without mention of lumbar back pain or lower extremity pain: Secondary | ICD-10-CM

## 2021-09-20 DIAGNOSIS — M5136 Other intervertebral disc degeneration, lumbar region: Secondary | ICD-10-CM | POA: Diagnosis not present

## 2021-09-20 NOTE — Progress Notes (Signed)
Chief Complaint  Patient presents with   Back Pain    States pain is right hip/ but points to right buttocks area as painful since about December    STARTED IN DEC   GOT BETTER   THEN HAD TROUBLE GETTING UP AND WALKING BECAUSE OF SEVERE PAIN   PAIN RIGHT CHEEK   Physical Exam Constitutional:      General: She is not in acute distress.    Appearance: Normal appearance. She is not ill-appearing, toxic-appearing or diaphoretic.  Musculoskeletal:     Right hip: Tenderness present. No deformity. Decreased range of motion.     Left hip: No deformity. Normal range of motion.     Comments: LEG LENGTHS EQUAL  RIGHT HIP LESS MOTION THAN LEFT  SOME PAIN WITH ROTATION   Skin:    Capillary Refill: Capillary refill takes less than 2 seconds.  Neurological:     General: No focal deficit present.     Mental Status: She is alert.     Gait: Gait normal.  Psychiatric:        Mood and Affect: Mood normal.        Behavior: Behavior normal.        Thought Content: Thought content normal.        Judgment: Judgment normal.    XRAYS HIP NORMAL  L SPINE DDD     PT AGAIN THEN HEP  FU PRN BACK

## 2021-09-21 ENCOUNTER — Ambulatory Visit (HOSPITAL_COMMUNITY): Payer: 59 | Attending: Orthopedic Surgery | Admitting: Occupational Therapy

## 2021-09-21 ENCOUNTER — Encounter (HOSPITAL_COMMUNITY): Payer: Self-pay | Admitting: Occupational Therapy

## 2021-09-21 DIAGNOSIS — M1611 Unilateral primary osteoarthritis, right hip: Secondary | ICD-10-CM | POA: Diagnosis not present

## 2021-09-21 DIAGNOSIS — M25512 Pain in left shoulder: Secondary | ICD-10-CM

## 2021-09-21 DIAGNOSIS — M25612 Stiffness of left shoulder, not elsewhere classified: Secondary | ICD-10-CM

## 2021-09-21 DIAGNOSIS — R29898 Other symptoms and signs involving the musculoskeletal system: Secondary | ICD-10-CM

## 2021-09-21 NOTE — Therapy (Signed)
OUTPATIENT OCCUPATIONAL THERAPY TREATMENT NOTE   Patient Name: Hannah Cooper MRN: [id number] DOB:1956-02-13, 66 y.o., female Today's Date: 09/21/2021  PCP: Jake Samples, PA-C REFERRING PROVIDER: Carole Civil, MD   Assessed supine, er/IR adducted   Passive ROM Left 08/02/2021 Left  08/11/21 Left 09/01/21 Left 09/21/21  Shoulder flexion 77 82 135 150  Shoulder abduction 62 70 112 145  Shoulder internal rotation 90 90 90 90  Shoulder external rotation 26 40 53 59  (Blank rows = not tested)   Not assessed due to pain/precautions   Active ROM Left 08/02/2021 Left 09/01/2021 Left  09/21/21  Shoulder flexion   112 141  Shoulder abduction   106 141  Shoulder internal rotation   90 90  Shoulder external rotation   62 72  (Blank rows = not tested)     UE MMT:      Not assessed due to pain/precautions   MMT Left 08/02/2021 Left 09/01/2021 Left  09/21/21  Shoulder flexion   3-/5 4/5  Shoulder abduction   3-/5 4/5  Shoulder internal rotation   3/5 5/5  Shoulder external rotation   3/5 4/5  (Blank rows = not tested)   END OF SESSION:   OT End of Session - 09/21/21 1355     Visit Number 14    Number of Visits 16    Date for OT Re-Evaluation 10/01/21    Authorization Type 1) [employer], $20 copay 2) Generic [employer]    Authorization Time Period [employer]-60 visit limit combined (PT/OT/ST), 3 used on eval    Authorization - Visit Number 38    Authorization - Number of Visits 60    Progress Note Due on Visit 30    OT Start Time 1307    OT Stop Time 1350    OT Time Calculation (min) 43 min    Activity Tolerance Patient tolerated treatment well    Behavior During Therapy WFL for tasks assessed/performed                    Past Medical History:  Diagnosis Date   Arthritis    Atypical chest pain    chronic   Cancer ([place])    bone cancer   Chronic back pain    Chronic diastolic CHF (congestive heart failure) (HCC)    COPD (chronic obstructive  pulmonary disease) ([place])    DDD (degenerative disc disease), cervical    Diabetes mellitus without complication ([place])    History of radiation therapy 07/16/19-08/22/19   L4 spine ; Dr. Gery Pray   Hyperlipidemia    Hypertension    Lumbar radiculopathy    Meningitis    Normal coronary arteries 2014   Palpitations    Premature atrial contractions    PVC's (premature ventricular contractions)    Past Surgical History:  Procedure Laterality Date   ABDOMINAL HYSTERECTOMY     BREAST LUMPECTOMY WITH RADIOFREQUENCY TAG IDENTIFICATION Right 01/25/2021   Procedure: BREAST LUMPECTOMY WITH RADIOFREQUENCY TAG IDENTIFICATION;  Surgeon: Virl Cagey, MD;  Location: [facility];  Service: General;  Laterality: Right;   EXCISION OF BREAST BIOPSY Right 01/25/2021   Procedure: EXCISION OF BREAST BIOPSY;  Surgeon: Virl Cagey, MD;  Location: [facility];  Service: General;  Laterality: Right;   IR FLUORO GUIDED NEEDLE PLC ASPIRATION/INJECTION LOC  06/19/2019   LEFT HEART CATHETERIZATION WITH CORONARY ANGIOGRAM N/A 09/05/2012   Procedure: LEFT HEART CATHETERIZATION WITH CORONARY ANGIOGRAM;  Surgeon: Wellington Hampshire, MD;  Location: [facility]  CATH LAB;  Service: Cardiovascular;  Laterality: N/A;   RESECTION DISTAL CLAVICAL Left 07/03/2020   Procedure: RESECTION DISTAL CLAVICAL;  Surgeon: Carole Civil, MD;  Location: [facility];  Service: Orthopedics;  Laterality: Left;   SHOULDER OPEN ROTATOR CUFF REPAIR Left 07/03/2020   Procedure: ROTATOR CUFF REPAIR SHOULDER OPEN WITH CHROMEOPLASTY;  Surgeon: Carole Civil, MD;  Location: [facility];  Service: Orthopedics;  Laterality: Left;   SHOULDER OPEN ROTATOR CUFF REPAIR Left 07/02/2021   Procedure: ROTATOR CUFF REPAIR SHOULDER OPEN with Patch Graft;  Surgeon: Carole Civil, MD;  Location: [facility];  Service: Orthopedics;  Laterality: Left;   THYROID SURGERY  2002   Patient Active Problem List   Diagnosis Date Noted   Traumatic incomplete tear of left rotator  cuff    Intraductal papilloma of right breast 01/05/2021   S/p left rotator cuff repair distal clavicle resection 07/03/20  07/07/2020   Complete tear of left rotator cuff    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[place]) 07/10/2019   Plasma cell disorder 06/10/2019   Lesion of bone of lumbosacral spine 05/28/2019   Left-sided weakness 03/13/2016   Cerebrovascular accident (CVA) ([place])    Palpitations    COPD (chronic obstructive pulmonary disease) ([place]) 08/20/2014   Dyspnea 12/19/2013   Chronic diastolic CHF (congestive heart failure) ([place]) 07/05/2013   Lower extremity edema 07/05/2013   Hyperlipidemia    Chest pain 07/04/2013   Hypokalemia 09/04/2012   Precordial pain 09/04/2012   Viral meningitis 01/22/2011   PNEUMONIA, LEFT LOWER LOBE 10/10/2006   DISEASE, ACUTE BRONCHOSPASM 10/10/2006   Essential hypertension 05/23/2006   OSTEOARTHRITIS 05/23/2006    ONSET DATE: 07/02/21  REFERRING DIAG: s/p left RCR  THERAPY DIAG:  Other symptoms and signs involving the musculoskeletal system  Stiffness of left shoulder, not elsewhere classified  Acute pain of left shoulder   PERTINENT HISTORY: 07/02/2021  PRECAUTIONS: Shoulder;  Standard RCR protocol -  6-12 weeks: AAROM progressing to AROM  (08/13/21 - 09/24/21) 12-24 weeks: Strengthening exercises   Rationale for Evaluation and Treatment Rehabilitation   SUBJECTIVE: S: Everything has gotten better.  PAIN:  Are you having pain? No NPRS scale:  Pain location:  Pain description:  Aggravating factors:  Relieving factors:      OBJECTIVE:  FOTO:  58/100 (30/100 previous)    Assessed supine, er/IR adducted   Passive ROM Left 08/02/2021 Left  08/11/21 Left 09/01/21 Left 09/21/21  Shoulder flexion 77 82 135 150  Shoulder abduction 62 70 112 145  Shoulder internal rotation 90 90 90 90  Shoulder external rotation 26 40 53 59  (Blank rows = not tested)   Not assessed due to pain/precautions   Active ROM  Left 08/02/2021 Left 09/01/2021 Left  09/21/21  Shoulder flexion   112 141  Shoulder abduction   106 141  Shoulder internal rotation   90 90  Shoulder external rotation   62 72  (Blank rows = not tested)     UE MMT:      Not assessed due to pain/precautions   MMT Left 08/02/2021 Left 09/01/2021 Left  09/21/21  Shoulder flexion   3-/5 4/5  Shoulder abduction   3-/5 4/5  Shoulder internal rotation   3/5 5/5  Shoulder external rotation   3/5 4/5  (Blank rows = not tested)     GOALS: Goals reviewed with patient? Yes   SHORT TERM GOALS: Target date: 08/30/2021   Pt will be provided with and educated on HEP to improve  mobility of LUE required for use as dominant during ADL completion.    Goal status: Met   2.  Pt will increase LUE P/ROM to [facility] to improve ability to complete dressing and bathing tasks with minimal compensatory strategies.    Goal status: Met   3.  Pt will increase LUE strength to 3/5 or greater to improve ability to reach for items at waist to chest height during ADLs such as meal preparation and eating.     Goal status: Met         LONG TERM GOALS: Target date: 09/27/2021   Pt will return to highest functional level using LUE as non-dominant during ADL and leisure tasks.    Goal status: on-going   2.  Pt will decrease pain in LUE to 3/10 or less to improve ability to sleep for 3+ consecutive hours without waking due to pain.    Goal status: on-going   3.  Pt will decrease LUE fascial restrictions to trace amounts to improve mobility required for functional reaching tasks.    Goal status: on-going   4.  Pt will increase LUE A/ROM to [facility] to improve ability to reach overhead and behind back when bathing and dressing.    Goal status: Partially Met   5.  Pt will increase LUE strength to 4+/5 or greater to improve ability to perform lifting tasks required for caring great grandchildren.    Goal status: Partially Met    TODAY'S  TREATMENT:    09/21/21  - P/ROM: shoulder, supine, flexion, IR/er, abduction, horizontal abduction 5X -A/ROM: standing-protraction, flexion, er/IR using washcloth, abduction, horizontal abduction, 15X each - Stretching: standing flexion, abduction on the wall, 3x10" - Functional Reaching: Pt reaching laterally into abduction to grab pegs and then reaching above head in flexion to place pegs, then removing pegs and reaching cross body into horizontal abduction to return pegs.  Diona Foley on wall: 1' flexion 1' abduction, green ball  -Arms on fire: 2 minutes total, 4 positions, 15" each, 1 rest break with hands on top of head       09/16/21  - P/ROM: shoulder, supine, flexion, IR/er, abduction, horizontal abduction 5X  -A/ROM: Sidelying, shoulder protraction, flexion, er/IR, horizontal abduction, abduction, 10X each -A/ROM: standing-protraction, flexion, er/IR using washcloth, abduction, horizontal abduction, 12X each -X to V arms: 12X each -Proximal shoulder strengthening: standing, 4 positions, 12X each, no rest breaks -Ball on wall: 1' flexion 1' abduction, green ball  -Functional reaching: pt removing items from top shelf of kitchen cabinet, moving items from bottom shelf to top shelf, then replacing original items onto bottom shelf.  -Arms on fire: 2 minutes total, 4 positions, 15" each, 2 rest breaks with hands on top of head -Y lift off: 10X  09/14/21 - P/ROM: shoulder, supine, flexion, IR/er, abduction, horizontal abduction 5X - A/ROM: supine-protraction, flexion, horizontal abduction, er/IR, abduction, 12X each   -Proximal shoulder strengthening in supine: 4 positions, 10X each, no rest breaks -A/ROM: standing-protraction, flexion, er/IR using washcloth, abduction, horizontal abduction, 12X each -X to V arms: 10X each -ball on wall: 1' flexion, 1' abduction -Overhead lacing: lacing from top down and then bottom up -Rolling green therapy ball up the wall and then bringing back down, 5X,  working on flexion stretch      PATIENT EDUCATION: Education details: discussed progress Person educated: patient Education method: explanation Education comprehension: verbalized understanding     HOME EXERCISE PROGRAM: 4/17: Table slides 5/9: AA/ROM; 5/25: red scapular theraband; 5/30: A/ROM  ASSESSMENT:   CLINICAL IMPRESSION: A: Pt reports she goes to the MD tomorrow, ROM and MMT completed for MD appt. Pt is now demonstrating ROM and strength near [facility], reports she is using her LUE during ADLs, does have pain at times. Pt has met all STGs, has partially met 2/5 LTGs. Continued with A/ROM and shoulder stability work. Completed functional reaching using standing mirror with pegboard-min difficulty with task. Continued with arms on fire, one rest break today. Verbal cuing for form and technique during exercises.    PLAN:   OT FREQUENCY: 2x/week  OT DURATION: 8 weeks  PLANNED INTERVENTIONS: self care/ADL training, therapeutic exercise, therapeutic activity, neuromuscular re-education, manual therapy, passive range of motion, electrical stimulation, ultrasound, moist heat, cryotherapy, patient/family education, and DME and/or AE instructions    CONSULTED AND AGREED WITH PLAN OF CARE: Patient  PLAN FOR NEXT SESSION: P: Follow up on MD appt. Continue with activity tolerance work, progress to green band for scapular strengthening     Guadelupe Sabin, OTR/L  [phone] 09/21/2021, 1:55 PM

## 2021-09-23 ENCOUNTER — Encounter (HOSPITAL_COMMUNITY): Payer: Self-pay | Admitting: Physical Therapy

## 2021-09-23 ENCOUNTER — Encounter (HOSPITAL_COMMUNITY): Payer: Self-pay | Admitting: Occupational Therapy

## 2021-09-23 ENCOUNTER — Ambulatory Visit (INDEPENDENT_AMBULATORY_CARE_PROVIDER_SITE_OTHER): Payer: HMO | Admitting: Orthopedic Surgery

## 2021-09-23 ENCOUNTER — Ambulatory Visit (HOSPITAL_COMMUNITY): Payer: 59 | Attending: Orthopedic Surgery | Admitting: Occupational Therapy

## 2021-09-23 ENCOUNTER — Ambulatory Visit (HOSPITAL_COMMUNITY): Payer: 59 | Admitting: Physical Therapy

## 2021-09-23 DIAGNOSIS — M6281 Muscle weakness (generalized): Secondary | ICD-10-CM

## 2021-09-23 DIAGNOSIS — M545 Low back pain, unspecified: Secondary | ICD-10-CM

## 2021-09-23 DIAGNOSIS — M25512 Pain in left shoulder: Secondary | ICD-10-CM | POA: Diagnosis not present

## 2021-09-23 DIAGNOSIS — R29898 Other symptoms and signs involving the musculoskeletal system: Secondary | ICD-10-CM

## 2021-09-23 DIAGNOSIS — R2689 Other abnormalities of gait and mobility: Secondary | ICD-10-CM

## 2021-09-23 DIAGNOSIS — M25612 Stiffness of left shoulder, not elsewhere classified: Secondary | ICD-10-CM | POA: Diagnosis not present

## 2021-09-23 DIAGNOSIS — M5136 Other intervertebral disc degeneration, lumbar region: Secondary | ICD-10-CM | POA: Diagnosis not present

## 2021-09-23 DIAGNOSIS — Z9889 Other specified postprocedural states: Secondary | ICD-10-CM

## 2021-09-23 DIAGNOSIS — M25551 Pain in right hip: Secondary | ICD-10-CM

## 2021-09-23 NOTE — Patient Instructions (Signed)
Do home exercises for the shoulder and the back
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
## 2021-09-23 NOTE — Therapy (Signed)
OUTPATIENT OCCUPATIONAL THERAPY TREATMENT NOTE   Patient Name: Hannah Cooper MRN: [id number] DOB:November 01, 1955, 66 y.o., female Today's Date: 09/23/2021  PCP: Jake Samples, PA-C REFERRING PROVIDER: Carole Civil, MD    END OF SESSION:   OT End of Session - 09/23/21 1345     Visit Number 15    Number of Visits 16    Date for OT Re-Evaluation 10/01/21    Authorization Type 1) [employer], $20 copay 2) Generic [employer]    Authorization Time Period [employer]-60 visit limit combined (PT/OT/ST), 3 used on eval    Authorization - Visit Number 18    Authorization - Number of Visits 60    Progress Note Due on Visit 45    OT Start Time 1302    OT Stop Time 1343    OT Time Calculation (min) 41 min    Activity Tolerance Patient tolerated treatment well    Behavior During Therapy WFL for tasks assessed/performed                     Past Medical History:  Diagnosis Date   Arthritis    Atypical chest pain    chronic   Cancer ([place])    bone cancer   Chronic back pain    Chronic diastolic CHF (congestive heart failure) (HCC)    COPD (chronic obstructive pulmonary disease) ([place])    DDD (degenerative disc disease), cervical    Diabetes mellitus without complication ([place])    History of radiation therapy 07/16/19-08/22/19   L4 spine ; Dr. Gery Pray   Hyperlipidemia    Hypertension    Lumbar radiculopathy    Meningitis    Normal coronary arteries 2014   Palpitations    Premature atrial contractions    PVC's (premature ventricular contractions)    Past Surgical History:  Procedure Laterality Date   ABDOMINAL HYSTERECTOMY     BREAST LUMPECTOMY WITH RADIOFREQUENCY TAG IDENTIFICATION Right 01/25/2021   Procedure: BREAST LUMPECTOMY WITH RADIOFREQUENCY TAG IDENTIFICATION;  Surgeon: Virl Cagey, MD;  Location[facility];  Service: General;  Laterality: Right;   EXCISION OF BREAST BIOPSY Right 01/25/2021   Procedure: EXCISION OF BREAST BIOPSY;  Surgeon:  Virl Cagey, MD;  Location: [facility];  Service: General;  Laterality: Right;   IR FLUORO GUIDED NEEDLE PLC ASPIRATION/INJECTION LOC  06/19/2019   LEFT HEART CATHETERIZATION WITH CORONARY ANGIOGRAM N/A 09/05/2012   Procedure: LEFT HEART CATHETERIZATION WITH CORONARY ANGIOGRAM;  Surgeon: Wellington Hampshire, MD;  Location: [place] CATH LAB;  Service: Cardiovascular;  Laterality: N/A;   RESECTION DISTAL CLAVICAL Left 07/03/2020   Procedure: RESECTION DISTAL CLAVICAL;  Surgeon: Carole Civil, MD;  Location: [facility];  Service: Orthopedics;  Laterality: Left;   SHOULDER OPEN ROTATOR CUFF REPAIR Left 07/03/2020   Procedure: ROTATOR CUFF REPAIR SHOULDER OPEN WITH CHROMEOPLASTY;  Surgeon: Carole Civil, MD;  Location: [facility];  Service: Orthopedics;  Laterality: Left;   SHOULDER OPEN ROTATOR CUFF REPAIR Left 07/02/2021   Procedure: ROTATOR CUFF REPAIR SHOULDER OPEN with Patch Graft;  Surgeon: Carole Civil, MD;  Location: [facility];  Service: Orthopedics;  Laterality: Left;   THYROID SURGERY  2002   Patient Active Problem List   Diagnosis Date Noted   Traumatic incomplete tear of left rotator cuff    Intraductal papilloma of right breast 01/05/2021   S/p left rotator cuff repair distal clavicle resection 07/03/20  07/07/2020   Complete tear of left rotator cuff    Plasmacytoma not  having achieved remission ([place]) 07/10/2019   Plasma cell disorder 06/10/2019   Lesion of bone of lumbosacral spine 05/28/2019   Left-sided weakness 03/13/2016   Cerebrovascular accident (CVA) ([place])    Palpitations    COPD (chronic obstructive pulmonary disease) ([place]) 08/20/2014   Dyspnea 12/19/2013   Chronic diastolic CHF (congestive heart failure) ([place]) 07/05/2013   Lower extremity edema 07/05/2013   Hyperlipidemia    Chest pain 07/04/2013   Hypokalemia 09/04/2012   Precordial pain 09/04/2012   Viral meningitis 01/22/2011   PNEUMONIA, LEFT LOWER LOBE 10/10/2006   DISEASE, ACUTE BRONCHOSPASM 10/10/2006    Essential hypertension 05/23/2006   OSTEOARTHRITIS 05/23/2006    ONSET DATE: 07/02/21  REFERRING DIAG: s/p left RCR  THERAPY DIAG:  Other symptoms and signs involving the musculoskeletal system  Stiffness of left shoulder, not elsewhere classified  Acute pain of left shoulder   PERTINENT HISTORY: 07/02/2021  PRECAUTIONS: Shoulder;  Standard RCR protocol -  6-12 weeks: AAROM progressing to AROM  (08/13/21 - 09/24/21) 12-24 weeks: Strengthening exercises   Rationale for Evaluation and Treatment Rehabilitation   SUBJECTIVE: S: Everything has gotten better.  PAIN:  Are you having pain? No NPRS scale:  Pain location:  Pain description:  Aggravating factors:  Relieving factors:      OBJECTIVE:  FOTO:  58/100 (30/100 previous)    Assessed supine, er/IR adducted   Passive ROM Left 08/02/2021 Left  08/11/21 Left 09/01/21 Left 09/21/21  Shoulder flexion 77 82 135 150  Shoulder abduction 62 70 112 145  Shoulder internal rotation 90 90 90 90  Shoulder external rotation 26 40 53 59  (Blank rows = not tested)   Not assessed due to pain/precautions   Active ROM Left 08/02/2021 Left 09/01/2021 Left  09/21/21  Shoulder flexion   112 141  Shoulder abduction   106 141  Shoulder internal rotation   90 90  Shoulder external rotation   62 72  (Blank rows = not tested)     UE MMT:      Not assessed due to pain/precautions   MMT Left 08/02/2021 Left 09/01/2021 Left  09/21/21  Shoulder flexion   3-/5 4/5  Shoulder abduction   3-/5 4/5  Shoulder internal rotation   3/5 5/5  Shoulder external rotation   3/5 4/5  (Blank rows = not tested)     GOALS: Goals reviewed with patient? Yes   SHORT TERM GOALS: Target date: 08/30/2021   Pt will be provided with and educated on HEP to improve mobility of LUE required for use as dominant during ADL completion.    Goal status: Met   2.  Pt will increase LUE P/ROM to [facility] to improve ability to complete dressing and bathing tasks  with minimal compensatory strategies.    Goal status: Met   3.  Pt will increase LUE strength to 3/5 or greater to improve ability to reach for items at waist to chest height during ADLs such as meal preparation and eating.     Goal status: Met         LONG TERM GOALS: Target date: 09/27/2021   Pt will return to highest functional level using LUE as non-dominant during ADL and leisure tasks.    Goal status: on-going   2.  Pt will decrease pain in LUE to 3/10 or less to improve ability to sleep for 3+ consecutive hours without waking due to pain.    Goal status: on-going   3.  Pt will decrease LUE fascial restrictions  to trace amounts to improve mobility required for functional reaching tasks.    Goal status: on-going   4.  Pt will increase LUE A/ROM to [facility] to improve ability to reach overhead and behind back when bathing and dressing.    Goal status: Partially Met   5.  Pt will increase LUE strength to 4+/5 or greater to improve ability to perform lifting tasks required for caring great grandchildren.    Goal status: Partially Met    TODAY'S TREATMENT:  09/23/21  - P/ROM: shoulder, supine, flexion, IR/er, abduction, horizontal abduction 5X -A/ROM: Sidelying, shoulder protraction, flexion, er/IR, horizontal abduction, abduction, 15X each -A/ROM: standing-protraction, flexion, er/IR using washcloth, abduction, horizontal abduction, 15X each -Proximal shoulder strengthening in standing: paddles/criss-cross/circles each direction, 15X each -Arms on fire: 2 minutes total, 4 positions, 15" each, 1 rest break with hands on top of head -Ball on wall: 1' flexion 1' abduction, green ball  -Green scapular theraband: row, extension, retraction, 10X each -Overhead lacing, 1# wrist weight, lacing from top down then reversing -UBE: Level 2, 2' forward 2' reverse, pace: 7.5    09/21/21  - P/ROM: shoulder, supine, flexion, IR/er, abduction, horizontal abduction 5X -A/ROM:  standing-protraction, flexion, er/IR using washcloth, abduction, horizontal abduction, 15X each - Stretching: standing flexion, abduction on the wall, 3x10" - Functional Reaching: Pt reaching laterally into abduction to grab pegs and then reaching above head in flexion to place pegs, then removing pegs and reaching cross body into horizontal abduction to return pegs.  Diona Foley on wall: 1' flexion 1' abduction, green ball  -Arms on fire: 2 minutes total, 4 positions, 15" each, 1 rest break with hands on top of head       09/16/21  - P/ROM: shoulder, supine, flexion, IR/er, abduction, horizontal abduction 5X  -A/ROM: Sidelying, shoulder protraction, flexion, er/IR, horizontal abduction, abduction, 10X each -A/ROM: standing-protraction, flexion, er/IR using washcloth, abduction, horizontal abduction, 12X each -X to V arms: 12X each -Proximal shoulder strengthening: standing, 4 positions, 12X each, no rest breaks -Ball on wall: 1' flexion 1' abduction, green ball  -Functional reaching: pt removing items from top shelf of kitchen cabinet, moving items from bottom shelf to top shelf, then replacing original items onto bottom shelf.  -Arms on fire: 2 minutes total, 4 positions, 15" each, 2 rest breaks with hands on top of head -Y lift off: 10X    PATIENT EDUCATION: Education details: discussed HEP completion Person educated: patient Education method: explanation Education comprehension: verbalized understanding     HOME EXERCISE PROGRAM: 4/17: Table slides 5/9: AA/ROM; 5/25: red scapular theraband; 5/30: A/ROM     ASSESSMENT:   CLINICAL IMPRESSION: A: Pt reports MD said she is still a little weak but she is improving. Continued with shoulder ROM and activity tolerance work, proximal shoulder strengthening. Added 1# wrist weight to overhead lacing task, pt requiring increased time for task compared to completion with no weight. Improving with form during arms on fire activity. Verbal cuing for  form and technique during exercises.    PLAN:   OT FREQUENCY: 2x/week  OT DURATION: 8 weeks  PLANNED INTERVENTIONS: self care/ADL training, therapeutic exercise, therapeutic activity, neuromuscular re-education, manual therapy, passive range of motion, electrical stimulation, ultrasound, moist heat, cryotherapy, patient/family education, and DME and/or AE instructions    CONSULTED AND AGREED WITH PLAN OF CARE: Patient  PLAN FOR NEXT SESSION: P: Progress to strengthening in preparation for discharge with HEP     Guadelupe Sabin, OTR/L  [phone] 09/23/2021, 1:46 PM

## 2021-09-23 NOTE — Progress Notes (Signed)
Chief Complaint  Patient presents with   Routine Post Op    LT RCR DOS 07/02/21   Patient is status post left rotator cuff repair March 2023 which is a revision repair which showed very minor damage although the patient was complaining of quite a bit of pain after a reinjury status post surgery March 2022  Now she has 150 degrees of forward elevation her shoulder pain is decreased  We will see her in about 6 to 8 weeks for reevaluation of her lower back pain and right hip pain as she is undergoing physical therapy  She should continue her home exercises for the shoulder  Encounter Diagnoses  Name Primary?   S/p left rotator cuff repair distal clavicle resection 07/02/20 Yes   Pain in right hip    DDD (degenerative disc disease), lumbar

## 2021-09-27 ENCOUNTER — Encounter (HOSPITAL_COMMUNITY): Payer: Self-pay | Admitting: Occupational Therapy

## 2021-09-27 ENCOUNTER — Ambulatory Visit (HOSPITAL_COMMUNITY): Payer: Managed Care, Other (non HMO) | Attending: Family Medicine | Admitting: Occupational Therapy

## 2021-09-27 DIAGNOSIS — M25512 Pain in left shoulder: Secondary | ICD-10-CM | POA: Diagnosis present

## 2021-09-27 DIAGNOSIS — M25612 Stiffness of left shoulder, not elsewhere classified: Secondary | ICD-10-CM | POA: Diagnosis present

## 2021-09-27 DIAGNOSIS — R29898 Other symptoms and signs involving the musculoskeletal system: Secondary | ICD-10-CM | POA: Diagnosis present

## 2021-09-27 DIAGNOSIS — M25551 Pain in right hip: Secondary | ICD-10-CM | POA: Diagnosis present

## 2021-09-27 DIAGNOSIS — M545 Low back pain, unspecified: Secondary | ICD-10-CM | POA: Insufficient documentation

## 2021-09-27 DIAGNOSIS — R2689 Other abnormalities of gait and mobility: Secondary | ICD-10-CM | POA: Diagnosis present

## 2021-09-27 NOTE — Therapy (Signed)
OUTPATIENT OCCUPATIONAL THERAPY TREATMENT NOTE   Patient Name: Hannah Cooper MRN: [id number] DOB:09-Nov-1955, 66 y.o., female Today's Date: 09/27/2021  PCP: Jake Samples, PA-C REFERRING PROVIDER: Carole Civil, MD    END OF SESSION:   OT End of Session - 09/27/21 1122     Visit Number 16    Number of Visits 16    Date for OT Re-Evaluation 10/01/21    Authorization Type 1) [employer], $20 copay 2) Generic [employer]    Authorization Time Period [employer]-60 visit limit combined (PT/OT/ST), 3 used on eval    Authorization - Visit Number 74    Authorization - Number of Visits 60    Progress Note Due on Visit 38    OT Start Time 1035    OT Stop Time 1113    OT Time Calculation (min) 38 min    Activity Tolerance Patient tolerated treatment well    Behavior During Therapy WFL for tasks assessed/performed                      Past Medical History:  Diagnosis Date   Arthritis    Atypical chest pain    chronic   Cancer ([place])    bone cancer   Chronic back pain    Chronic diastolic CHF (congestive heart failure) (HCC)    COPD (chronic obstructive pulmonary disease) ([place])    DDD (degenerative disc disease), cervical    Diabetes mellitus without complication ([place])    History of radiation therapy 07/16/19-08/22/19   L4 spine ; Dr. Gery Pray   Hyperlipidemia    Hypertension    Lumbar radiculopathy    Meningitis    Normal coronary arteries 2014   Palpitations    Premature atrial contractions    PVC's (premature ventricular contractions)    Past Surgical History:  Procedure Laterality Date   ABDOMINAL HYSTERECTOMY     BREAST LUMPECTOMY WITH RADIOFREQUENCY TAG IDENTIFICATION Right 01/25/2021   Procedure: BREAST LUMPECTOMY WITH RADIOFREQUENCY TAG IDENTIFICATION;  Surgeon: Virl Cagey, MD;  Location: [facility];  Service: General;  Laterality: Right;   EXCISION OF BREAST BIOPSY Right 01/25/2021   Procedure: EXCISION OF BREAST BIOPSY;  Surgeon:  Virl Cagey, MD;  Location: [facility];  Service: General;  Laterality: Right;   IR FLUORO GUIDED NEEDLE PLC ASPIRATION/INJECTION LOC  06/19/2019   LEFT HEART CATHETERIZATION WITH CORONARY ANGIOGRAM N/A 09/05/2012   Procedure: LEFT HEART CATHETERIZATION WITH CORONARY ANGIOGRAM;  Surgeon: Wellington Hampshire, MD;  Location: [facility];  Service: Cardiovascular;  Laterality: N/A;   RESECTION DISTAL CLAVICAL Left 07/03/2020   Procedure: RESECTION DISTAL CLAVICAL;  Surgeon: Carole Civil, MD;  Location: [facility];  Service: Orthopedics;  Laterality: Left;   SHOULDER OPEN ROTATOR CUFF REPAIR Left 07/03/2020   Procedure: ROTATOR CUFF REPAIR SHOULDER OPEN WITH CHROMEOPLASTY;  Surgeon: Carole Civil, MD;  Location: [facility];  Service: Orthopedics;  Laterality: Left;   SHOULDER OPEN ROTATOR CUFF REPAIR Left 07/02/2021   Procedure: ROTATOR CUFF REPAIR SHOULDER OPEN with Patch Graft;  Surgeon: Carole Civil, MD;  Location: [facility];  Service: Orthopedics;  Laterality: Left;   THYROID SURGERY  2002   Patient Active Problem List   Diagnosis Date Noted   Traumatic incomplete tear of left rotator cuff    Intraductal papilloma of right breast 01/05/2021   S/p left rotator cuff repair distal clavicle resection 07/03/20  07/07/2020   Complete tear of left rotator cuff    Plasmacytoma  not having achieved remission ([place]) 07/10/2019   Plasma cell disorder 06/10/2019   Lesion of bone of lumbosacral spine 05/28/2019   Left-sided weakness 03/13/2016   Cerebrovascular accident (CVA) ([place])    Palpitations    COPD (chronic obstructive pulmonary disease) ([place]) 08/20/2014   Dyspnea 12/19/2013   Chronic diastolic CHF (congestive heart failure) ([place]) 07/05/2013   Lower extremity edema 07/05/2013   Hyperlipidemia    Chest pain 07/04/2013   Hypokalemia 09/04/2012   Precordial pain 09/04/2012   Viral meningitis 01/22/2011   PNEUMONIA, LEFT LOWER LOBE 10/10/2006   DISEASE, ACUTE BRONCHOSPASM 10/10/2006    Essential hypertension 05/23/2006   OSTEOARTHRITIS 05/23/2006    ONSET DATE: 07/02/21  REFERRING DIAG: s/p left RCR  THERAPY DIAG:  Other symptoms and signs involving the musculoskeletal system  Stiffness of left shoulder, not elsewhere classified  Acute pain of left shoulder   PERTINENT HISTORY: 07/02/2021  PRECAUTIONS: Shoulder;  Standard RCR protocol -  6-12 weeks: AAROM progressing to AROM  (08/13/21 - 09/24/21) 12-24 weeks: Strengthening exercises   Rationale for Evaluation and Treatment Rehabilitation   SUBJECTIVE: S: I don't even notice the arm.   PAIN:  Are you having pain? No NPRS scale:  Pain location:  Pain description:  Aggravating factors:  Relieving factors:      OBJECTIVE:  FOTO:  58/100 (30/100 previous)    Assessed supine, er/IR adducted   Passive ROM Left 08/02/2021 Left  08/11/21 Left 09/01/21 Left 09/21/21  Shoulder flexion 77 82 135 150  Shoulder abduction 62 70 112 145  Shoulder internal rotation 90 90 90 90  Shoulder external rotation 26 40 53 59  (Blank rows = not tested)   Not assessed due to pain/precautions   Active ROM Left 08/02/2021 Left 09/01/2021 Left  09/21/21  Shoulder flexion   112 141  Shoulder abduction   106 141  Shoulder internal rotation   90 90  Shoulder external rotation   62 72  (Blank rows = not tested)     UE MMT:      Not assessed due to pain/precautions   MMT Left 08/02/2021 Left 09/01/2021 Left  09/21/21  Shoulder flexion   3-/5 4/5  Shoulder abduction   3-/5 4/5  Shoulder internal rotation   3/5 5/5  Shoulder external rotation   3/5 4/5  (Blank rows = not tested)     GOALS: Goals reviewed with patient? Yes   SHORT TERM GOALS: Target date: 08/30/2021   Pt will be provided with and educated on HEP to improve mobility of LUE required for use as dominant during ADL completion.    Goal status: Met   2.  Pt will increase LUE P/ROM to [facility] to improve ability to complete dressing and bathing tasks  with minimal compensatory strategies.    Goal status: Met   3.  Pt will increase LUE strength to 3/5 or greater to improve ability to reach for items at waist to chest height during ADLs such as meal preparation and eating.     Goal status: Met         LONG TERM GOALS: Target date: 09/27/2021   Pt will return to highest functional level using LUE as non-dominant during ADL and leisure tasks.    Goal status: on-going   2.  Pt will decrease pain in LUE to 3/10 or less to improve ability to sleep for 3+ consecutive hours without waking due to pain.    Goal status: on-going   3.  Pt will  decrease LUE fascial restrictions to trace amounts to improve mobility required for functional reaching tasks.    Goal status: on-going   4.  Pt will increase LUE A/ROM to [facility] to improve ability to reach overhead and behind back when bathing and dressing.    Goal status: Partially Met   5.  Pt will increase LUE strength to 4+/5 or greater to improve ability to perform lifting tasks required for caring great grandchildren.    Goal status: Partially Met    TODAY'S TREATMENT:  09/27/21  - P/ROM: shoulder, supine, flexion, IR/er, abduction, horizontal abduction 5X -Shoulder strengthening: 1#, supine-shoulder protraction, flexion, er/IR, horizontal abduction, abduction, 10X each -Arms on fire: 2 minutes total, 4 positions, 15" each, no rest breaks today -Shoulder strengthening, 1#: standing-protraction, flexion, er/IR using washcloth, abduction, horizontal abduction, 10X each  -Therapy ball strengthening: green ball, chest press, overhead press, flexion, circles each direction, 10X each  -Ball on wall: 1' flexion, 1' abduction  -Functional Reaching: 1# wrist weight, pt removing items from top shelf of kitchen cabinet, placing items from middle   Shelf onto top shelf, then placing original items back on top shelf  09/23/21  - P/ROM: shoulder, supine, flexion, IR/er, abduction, horizontal abduction  5X -A/ROM: Sidelying, shoulder protraction, flexion, er/IR, horizontal abduction, abduction, 15X each -A/ROM: standing-protraction, flexion, er/IR using washcloth, abduction, horizontal abduction, 15X each -Proximal shoulder strengthening in standing: paddles/criss-cross/circles each direction, 15X each -Arms on fire: 2 minutes total, 4 positions, 15" each, 1 rest break with hands on top of head -Ball on wall: 1' flexion 1' abduction, green ball  -Green scapular theraband: row, extension, retraction, 10X each -Overhead lacing, 1# wrist weight, lacing from top down then reversing -UBE: Level 2, 2' forward 2' reverse, pace: 7.5    09/21/21  - P/ROM: shoulder, supine, flexion, IR/er, abduction, horizontal abduction 5X -A/ROM: standing-protraction, flexion, er/IR using washcloth, abduction, horizontal abduction, 15X each - Stretching: standing flexion, abduction on the wall, 3x10" - Functional Reaching: Pt reaching laterally into abduction to grab pegs and then reaching above head in flexion to place pegs, then removing pegs and reaching cross body into horizontal abduction to return pegs.  Diona Foley on wall: 1' flexion 1' abduction, green ball  -Arms on fire: 2 minutes total, 4 positions, 15" each, 1 rest break with hands on top of head           PATIENT EDUCATION: Education details: discussed HEP completion Person educated: patient Education method: explanation Education comprehension: verbalized understanding     HOME EXERCISE PROGRAM: 4/17: Table slides 5/9: AA/ROM; 5/25: red scapular theraband; 5/30: A/ROM     ASSESSMENT:   CLINICAL IMPRESSION: A: Pt reports she has been using her arm, does not notice it much. Progressed to strengthening today in preparation for discharge with HEP. Added 1# weights to supine and standing A/ROM exercises, continued with arms on fire, pt able to complete with no rest breaks today and demonstrates improved form throughout exercise. Added therapy ball  strengthening, added 1# wrist weight to functional reaching. Verbal cuing for form, keeping elbows extended. Less rest breaks required today.    PLAN:   OT FREQUENCY: 2x/week  OT DURATION: 8 weeks  PLANNED INTERVENTIONS: self care/ADL training, therapeutic exercise, therapeutic activity, neuromuscular re-education, manual therapy, passive range of motion, electrical stimulation, ultrasound, moist heat, cryotherapy, patient/family education, and DME and/or AE instructions    CONSULTED AND AGREED WITH PLAN OF CARE: Patient  PLAN FOR NEXT SESSION: P: Discharge with HEP  Guadelupe Sabin, OTR/L  [phone] 09/27/2021, 11:23 AM

## 2021-09-28 ENCOUNTER — Encounter (HOSPITAL_COMMUNITY): Payer: HMO

## 2021-09-29 ENCOUNTER — Encounter (HOSPITAL_COMMUNITY): Payer: Self-pay | Admitting: Occupational Therapy

## 2021-09-29 ENCOUNTER — Ambulatory Visit (HOSPITAL_COMMUNITY): Payer: Managed Care, Other (non HMO) | Admitting: Occupational Therapy

## 2021-09-29 DIAGNOSIS — R29898 Other symptoms and signs involving the musculoskeletal system: Secondary | ICD-10-CM

## 2021-09-29 DIAGNOSIS — M25512 Pain in left shoulder: Secondary | ICD-10-CM

## 2021-09-29 DIAGNOSIS — M25612 Stiffness of left shoulder, not elsewhere classified: Secondary | ICD-10-CM

## 2021-09-29 NOTE — Therapy (Signed)
OUTPATIENT OCCUPATIONAL THERAPY REASSESSMENT, TREATMENT NOTE, DISCHARGE SUMMARY   Patient Name: Hannah Cooper MRN: [id number] DOB:1955-12-31, 66 y.o., female Today's Date: 09/29/2021  PCP: Jake Samples, PA-C REFERRING PROVIDER: Carole Civil, MD    END OF SESSION:   OT End of Session - 09/29/21 1004     Visit Number 17    Number of Visits 17    Date for OT Re-Evaluation 10/01/21    Authorization Type 1) [employer], $20 copay 2) Generic [employer]    Authorization Time Period [employer]-60 visit limit combined (PT/OT/ST), 3 used on eval    Authorization - Visit Number 22    Authorization - Number of Visits 60    Progress Note Due on Visit 68    OT Start Time 0908    OT Stop Time 0936    OT Time Calculation (min) 28 min    Activity Tolerance Patient tolerated treatment well    Behavior During Therapy WFL for tasks assessed/performed                       Past Medical History:  Diagnosis Date   Arthritis    Atypical chest pain    chronic   Cancer ([place])    bone cancer   Chronic back pain    Chronic diastolic CHF (congestive heart failure) (HCC)    COPD (chronic obstructive pulmonary disease) ([place])    DDD (degenerative disc disease), cervical    Diabetes mellitus without complication ([place])    History of radiation therapy 07/16/19-08/22/19   L4 spine ; Dr. Gery Pray   Hyperlipidemia    Hypertension    Lumbar radiculopathy    Meningitis    Normal coronary arteries 2014   Palpitations    Premature atrial contractions    PVC's (premature ventricular contractions)    Past Surgical History:  Procedure Laterality Date   ABDOMINAL HYSTERECTOMY     BREAST LUMPECTOMY WITH RADIOFREQUENCY TAG IDENTIFICATION Right 01/25/2021   Procedure: BREAST LUMPECTOMY WITH RADIOFREQUENCY TAG IDENTIFICATION;  Surgeon: Virl Cagey, MD;  Location: [facility];  Service: General;  Laterality: Right;   EXCISION OF BREAST BIOPSY Right 01/25/2021   Procedure:  EXCISION OF BREAST BIOPSY;  Surgeon: Virl Cagey, MD;  Location: [facility];  Service: General;  Laterality: Right;   IR FLUORO GUIDED NEEDLE PLC ASPIRATION/INJECTION LOC  06/19/2019   LEFT HEART CATHETERIZATION WITH CORONARY ANGIOGRAM N/A 09/05/2012   Procedure: LEFT HEART CATHETERIZATION WITH CORONARY ANGIOGRAM;  Surgeon: Wellington Hampshire, MD;  Location: [facility];  Service: Cardiovascular;  Laterality: N/A;   RESECTION DISTAL CLAVICAL Left 07/03/2020   Procedure: RESECTION DISTAL CLAVICAL;  Surgeon: Carole Civil, MD;  Location: [facility];  Service: Orthopedics;  Laterality: Left;   SHOULDER OPEN ROTATOR CUFF REPAIR Left 07/03/2020   Procedure: ROTATOR CUFF REPAIR SHOULDER OPEN WITH CHROMEOPLASTY;  Surgeon: Carole Civil, MD;  Location: [facility];  Service: Orthopedics;  Laterality: Left;   SHOULDER OPEN ROTATOR CUFF REPAIR Left 07/02/2021   Procedure: ROTATOR CUFF REPAIR SHOULDER OPEN with Patch Graft;  Surgeon: Carole Civil, MD;  Location: [facility];  Service: Orthopedics;  Laterality: Left;   THYROID SURGERY  2002   Patient Active Problem List   Diagnosis Date Noted   Traumatic incomplete tear of left rotator cuff    Intraductal papilloma of right breast 01/05/2021   S/p left rotator cuff repair distal clavicle resection 07/03/20  07/07/2020   Complete tear of left rotator cuff  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[place]) 07/10/2019   Plasma cell disorder 06/10/2019   Lesion of bone of lumbosacral spine 05/28/2019   Left-sided weakness 03/13/2016   Cerebrovascular accident (CVA) ([place])    Palpitations    COPD (chronic obstructive pulmonary disease) ([place]) 08/20/2014   Dyspnea 12/19/2013   Chronic diastolic CHF (congestive heart failure) ([place]) 07/05/2013   Lower extremity edema 07/05/2013   Hyperlipidemia    Chest pain 07/04/2013   Hypokalemia 09/04/2012   Precordial pain 09/04/2012   Viral meningitis 01/22/2011   PNEUMONIA, LEFT LOWER LOBE 10/10/2006   DISEASE,  ACUTE BRONCHOSPASM 10/10/2006   Essential hypertension 05/23/2006   OSTEOARTHRITIS 05/23/2006    ONSET DATE: 07/02/21  REFERRING DIAG: s/p left RCR  THERAPY DIAG:  Other symptoms and signs involving the musculoskeletal system  Stiffness of left shoulder, not elsewhere classified  Acute pain of left shoulder   PERTINENT HISTORY: 07/02/2021  PRECAUTIONS: Shoulder;  Standard RCR protocol -  6-12 weeks: AAROM progressing to AROM  (08/13/21 - 09/24/21) 12-24 weeks: Strengthening exercises   Rationale for Evaluation and Treatment Rehabilitation   SUBJECTIVE: S: I don't even notice the arm.   PAIN:  Are you having pain? No NPRS scale:  Pain location:  Pain description:  Aggravating factors:  Relieving factors:      OBJECTIVE:  FOTO:  63/100 (58/100 previous)    Assessed supine, er/IR adducted   Passive ROM Left 08/02/2021 Left  08/11/21 Left 09/01/21 Left 09/21/21 Left  09/29/21  Shoulder flexion 77 82 135 150 150  Shoulder abduction 62 70 112 145 153  Shoulder internal rotation 90 90 90 90 90  Shoulder external rotation 26 40 53 59 55  (Blank rows = not tested)   Not assessed due to pain/precautions   Active ROM Left 08/02/2021 Left 09/01/2021 Left  09/21/21 Left 09/29/21  Shoulder flexion   112 141 148  Shoulder abduction   106 141 141  Shoulder internal rotation   90 90 90  Shoulder external rotation   62 72 73  (Blank rows = not tested)     UE MMT:      Not assessed due to pain/precautions   MMT Left 08/02/2021 Left 09/01/2021 Left  09/21/21 Left 09/29/21  Shoulder flexion   3-/5 4/5 5/5  Shoulder abduction   3-/5 4/5 4+/5  Shoulder internal rotation   3/5 5/5 5/5  Shoulder external rotation   3/5 4/5 4+/5  (Blank rows = not tested)     GOALS: Goals reviewed with patient? Yes   SHORT TERM GOALS: Target date: 08/30/2021   Pt will be provided with and educated on HEP to improve mobility of LUE required for use as dominant during ADL completion.     Goal status: Met   2.  Pt will increase LUE P/ROM to [facility] to improve ability to complete dressing and bathing tasks with minimal compensatory strategies.    Goal status: Met   3.  Pt will increase LUE strength to 3/5 or greater to improve ability to reach for items at waist to chest height during ADLs such as meal preparation and eating.     Goal status: Met         LONG TERM GOALS: Target date: 09/27/2021   Pt will return to highest functional level using LUE as non-dominant during ADL and leisure tasks.    Goal status: Met   2.  Pt will decrease pain in LUE to 3/10 or less to improve ability to sleep for  3+ consecutive hours without waking due to pain.    Goal status: Met   3.  Pt will decrease LUE fascial restrictions to trace amounts to improve mobility required for functional reaching tasks.    Goal status: Met   4.  Pt will increase LUE A/ROM to [facility] to improve ability to reach overhead and behind back when bathing and dressing.    Goal status: Met   5.  Pt will increase LUE strength to 4+/5 or greater to improve ability to perform lifting tasks required for caring great grandchildren.    Goal status: Met    TODAY'S TREATMENT:    09/29/21 -P/ROM: shoulder, supine, flexion, IR/er, abduction, horizontal abduction 5X -Shoulder strengthening: shoulder protraction, flexion, er/IR, horizontal abduction, abduction, 10X each using green theraband   09/27/21  - P/ROM: shoulder, supine, flexion, IR/er, abduction, horizontal abduction 5X -Shoulder strengthening: 1#, supine-shoulder protraction, flexion, er/IR, horizontal abduction, abduction, 10X each -Arms on fire: 2 minutes total, 4 positions, 15" each, no rest breaks today -Shoulder strengthening, 1#: standing-protraction, flexion, er/IR using washcloth, abduction, horizontal abduction, 10X each  -Therapy ball strengthening: green ball, chest press, overhead press, flexion, circles each direction, 10X each  -Ball on wall:  1' flexion, 1' abduction  -Functional Reaching: 1# wrist weight, pt removing items from top shelf of kitchen cabinet, placing items from middle   Shelf onto top shelf, then placing original items back on top shelf  09/23/21  - P/ROM: shoulder, supine, flexion, IR/er, abduction, horizontal abduction 5X -A/ROM: Sidelying, shoulder protraction, flexion, er/IR, horizontal abduction, abduction, 15X each -A/ROM: standing-protraction, flexion, er/IR using washcloth, abduction, horizontal abduction, 15X each -Proximal shoulder strengthening in standing: paddles/criss-cross/circles each direction, 15X each -Arms on fire: 2 minutes total, 4 positions, 15" each, 1 rest break with hands on top of head -Ball on wall: 1' flexion 1' abduction, green ball  -Green scapular theraband: row, extension, retraction, 10X each -Overhead lacing, 1# wrist weight, lacing from top down then reversing -UBE: Level 2, 2' forward 2' reverse, pace: 7.5            PATIENT EDUCATION: Education details: green theraband strengthening Person educated: patient Education method: explanation, demonstration Education comprehension: verbalized understanding, returned demonstration     HOME EXERCISE PROGRAM: 4/17: Table slides 5/9: AA/ROM; 5/25: red scapular theraband; 5/30: A/ROM; 6/14: green theraband strengthening     ASSESSMENT:   CLINICAL IMPRESSION: A: Reassessment completed this session, pt has met all goals, is demonstrating ROM and strength WFL in LUE. Pt reports she is using the LUE during ADLs, is able to sleep without difficulty. Does experience occasional soreness, however nothing that lasts throughout the day, etc. Pt is agreeable to discharge today with HEP for strengthening. Pt completing green theraband shoulder strengthening and stability exercises, verbal cuing for form and technique. Also discussed adding weights to ROM exercises for additional strengthening.   PLAN:   OT FREQUENCY: 2x/week  OT  DURATION: 8 weeks  PLANNED INTERVENTIONS: self care/ADL training, therapeutic exercise, therapeutic activity, neuromuscular re-education, manual therapy, passive range of motion, electrical stimulation, ultrasound, moist heat, cryotherapy, patient/family education, and DME and/or AE instructions    CONSULTED AND AGREED WITH PLAN OF CARE: Patient  PLAN FOR NEXT SESSION: P: Discharge pt     Guadelupe Sabin, OTR/L  [phone] 09/29/2021, 10:05 AM   OCCUPATIONAL THERAPY DISCHARGE SUMMARY  Visits from Start of Care: 17  Current functional level related to goals / functional outcomes: See above. Pt has met all goals, is using LUE as non-dominant  during ADLs.    Remaining deficits: Decreased activity tolerance, occasional soreness   Education / Equipment: HEP for strengthening   Patient agrees to discharge. Patient goals were met. Patient is being discharged due to meeting the stated rehab goals.Marland Kitchen

## 2021-09-29 NOTE — Patient Instructions (Signed)

## 2021-09-30 ENCOUNTER — Encounter (HOSPITAL_COMMUNITY): Payer: Self-pay

## 2021-09-30 ENCOUNTER — Ambulatory Visit (HOSPITAL_COMMUNITY): Payer: Managed Care, Other (non HMO)

## 2021-09-30 DIAGNOSIS — M545 Low back pain, unspecified: Secondary | ICD-10-CM

## 2021-09-30 DIAGNOSIS — R29898 Other symptoms and signs involving the musculoskeletal system: Secondary | ICD-10-CM | POA: Diagnosis not present

## 2021-09-30 DIAGNOSIS — M25551 Pain in right hip: Secondary | ICD-10-CM

## 2021-09-30 DIAGNOSIS — R2689 Other abnormalities of gait and mobility: Secondary | ICD-10-CM
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
## 2021-10-01 DIAGNOSIS — H2511 Age-related nuclear cataract, right eye: Secondary | ICD-10-CM | POA: Diagnosis not present

## 2021-10-05 ENCOUNTER — Encounter (HOSPITAL_COMMUNITY): Payer: HMO | Admitting: Occupational Therapy

## 2021-10-06 ENCOUNTER — Ambulatory Visit (HOSPITAL_COMMUNITY): Payer: 59 | Attending: Family Medicine | Admitting: Physical Therapy

## 2021-10-06 DIAGNOSIS — R29898 Other symptoms and signs involving the musculoskeletal system: Secondary | ICD-10-CM | POA: Diagnosis not present

## 2021-10-06 DIAGNOSIS — R2689 Other abnormalities of gait and mobility: Secondary | ICD-10-CM | POA: Insufficient documentation

## 2021-10-06 DIAGNOSIS — M25551 Pain in right hip: Secondary | ICD-10-CM | POA: Diagnosis not present

## 2021-10-06 DIAGNOSIS — M545 Low back pain, unspecified: Secondary | ICD-10-CM | POA: Insufficient documentation
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
## 2021-10-07 ENCOUNTER — Encounter (HOSPITAL_COMMUNITY): Payer: HMO | Admitting: Occupational Therapy

## 2021-10-13 ENCOUNTER — Telehealth: Payer: Self-pay | Admitting: Oncology

## 2021-10-13 ENCOUNTER — Ambulatory Visit (HOSPITAL_COMMUNITY): Payer: Managed Care, Other (non HMO) | Admitting: Physical Therapy

## 2021-10-13 ENCOUNTER — Other Ambulatory Visit (HOSPITAL_COMMUNITY): Payer: Self-pay

## 2021-10-13 ENCOUNTER — Other Ambulatory Visit: Payer: Self-pay | Admitting: Radiation Oncology

## 2021-10-13 DIAGNOSIS — R29898 Other symptoms and signs involving the musculoskeletal system: Secondary | ICD-10-CM | POA: Diagnosis not present

## 2021-10-13 DIAGNOSIS — C9030 Solitary plasmacytoma not having achieved remission: Secondary | ICD-10-CM

## 2021-10-13 DIAGNOSIS — M545 Low back pain, unspecified: Secondary | ICD-10-CM

## 2021-10-13 DIAGNOSIS — G8918 Other acute postprocedural pain: Secondary | ICD-10-CM

## 2021-10-13 DIAGNOSIS — M25551 Pain in right hip: Secondary | ICD-10-CM

## 2021-10-13 MED ORDER — OXYCODONE HCL 5 MG PO TABS: 5.0000 mg | ORAL_TABLET | Freq: Every day | ORAL | 0 refills | Status: DC | PRN
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
## 2021-10-13 NOTE — Telephone Encounter (Signed)
Called [facility] regarding refill request for [employer]  Advised that Dr. Sondra Come will not be able to refill this medication.

## 2021-10-15 ENCOUNTER — Ambulatory Visit (HOSPITAL_COMMUNITY): Payer: Managed Care, Other (non HMO) | Admitting: Physical Therapy

## 2021-10-15 DIAGNOSIS — M545 Low back pain, unspecified: Secondary | ICD-10-CM

## 2021-10-15 DIAGNOSIS — R29898 Other symptoms and signs involving the musculoskeletal system: Secondary | ICD-10-CM | POA: Diagnosis not present

## 2021-10-15 DIAGNOSIS — M25551 Pain in right hip: Secondary | ICD-10-CM
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
## 2021-10-20 ENCOUNTER — Encounter (HOSPITAL_COMMUNITY): Payer: Self-pay | Admitting: Physical Therapy

## 2021-10-20 ENCOUNTER — Ambulatory Visit (HOSPITAL_COMMUNITY): Payer: HMO | Attending: Family Medicine | Admitting: Physical Therapy

## 2021-10-20 DIAGNOSIS — R2689 Other abnormalities of gait and mobility: Secondary | ICD-10-CM | POA: Diagnosis not present

## 2021-10-20 DIAGNOSIS — M545 Low back pain, unspecified: Secondary | ICD-10-CM | POA: Diagnosis not present

## 2021-10-20 DIAGNOSIS — M25551 Pain in right hip: Secondary | ICD-10-CM | POA: Insufficient documentation

## 2021-10-20 DIAGNOSIS — R29898 Other symptoms and signs involving the musculoskeletal system: Secondary | ICD-10-CM | POA: Diagnosis not present
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
## 2021-10-22 ENCOUNTER — Encounter (HOSPITAL_COMMUNITY): Payer: HMO

## 2021-10-25 ENCOUNTER — Encounter (HOSPITAL_COMMUNITY): Payer: HMO | Admitting: Physical Therapy

## 2021-10-28 ENCOUNTER — Encounter (HOSPITAL_COMMUNITY): Payer: Self-pay | Admitting: Physical Therapy

## 2021-10-28 ENCOUNTER — Ambulatory Visit (HOSPITAL_COMMUNITY): Payer: HMO | Admitting: Physical Therapy

## 2021-10-28 DIAGNOSIS — M25551 Pain in right hip: Secondary | ICD-10-CM | POA: Diagnosis not present

## 2021-10-28 DIAGNOSIS — R2689 Other abnormalities of gait and mobility: Secondary | ICD-10-CM

## 2021-10-28 DIAGNOSIS — R29898 Other symptoms and signs involving the musculoskeletal system: Secondary | ICD-10-CM

## 2021-10-28 DIAGNOSIS — M545 Low back pain, unspecified: Secondary | ICD-10-CM
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
## 2021-11-02 DIAGNOSIS — D509 Iron deficiency anemia, unspecified: Secondary | ICD-10-CM | POA: Diagnosis not present

## 2021-11-04 ENCOUNTER — Ambulatory Visit (INDEPENDENT_AMBULATORY_CARE_PROVIDER_SITE_OTHER): Payer: PPO | Admitting: Orthopedic Surgery

## 2021-11-04 ENCOUNTER — Encounter: Payer: Self-pay | Admitting: Orthopedic Surgery

## 2021-11-04 DIAGNOSIS — Z9889 Other specified postprocedural states: Secondary | ICD-10-CM

## 2021-11-04 NOTE — Progress Notes (Signed)
FOLLOW UP   Encounter Diagnosis  Name Primary?   S/p left rotator cuff repair distal clavicle resection 07/02/21 Yes     Chief Complaint  Patient presents with   Post-op Follow-up    07/02/2021     66 year old female status post rotator cuff repair  She had a posttraumatic injury after her first surgery  She was on hydrocodone 10 which she was prior to surgery  She has active abduction of 85 degrees and flexion 120 degrees she can reach her hand on top of her head she can reach behind her to the L5 region  Recommend continue home physical therapy  Follow-up in September determine if she needs chronic pain management hydrocodone and gentle range of motion

## 2021-11-05 ENCOUNTER — Other Ambulatory Visit: Payer: Self-pay | Admitting: Orthopedic Surgery

## 2021-11-05 ENCOUNTER — Telehealth: Payer: Self-pay | Admitting: Orthopedic Surgery

## 2021-11-05 DIAGNOSIS — G8918 Other acute postprocedural pain: Secondary | ICD-10-CM

## 2021-11-05 MED ORDER — HYDROCODONE-ACETAMINOPHEN 10-325 MG PO TABS: 1.0000 | ORAL_TABLET | Freq: Four times a day (QID) | ORAL | 0 refills | Status: DC | PRN

## 2021-11-05 NOTE — Progress Notes (Signed)
Meds ordered this encounter  Medications   HYDROcodone-acetaminophen (NORCO) 10-325 MG tablet    Sig: Take 1 tablet by mouth every 6 (six) hours as needed for moderate pain.    Dispense:  30 tablet    Refill:  0

## 2021-11-05 NOTE — Telephone Encounter (Signed)
Patient called to relay that [facility] does not have her prescription - states Dr Aline Brochure was to send a refill - per office visit yesterday, 11/04/21 for HYDROcodone-acetaminophen [facility]

## 2021-11-09 ENCOUNTER — Inpatient Hospital Stay (HOSPITAL_COMMUNITY): Payer: 59 | Attending: Hematology

## 2021-11-09 ENCOUNTER — Ambulatory Visit (HOSPITAL_COMMUNITY)
Admission: RE | Admit: 2021-11-09 | Discharge: 2021-11-09 | Disposition: A | Discharge status: Home / Self Care | Payer: 59 | Source: Ambulatory Visit | Attending: Hematology | Admitting: Hematology

## 2021-11-09 DIAGNOSIS — D472 Monoclonal gammopathy: Secondary | ICD-10-CM

## 2021-11-09 DIAGNOSIS — C9030 Solitary plasmacytoma not having achieved remission: Secondary | ICD-10-CM | POA: Insufficient documentation

## 2021-11-09 LAB — CBC WITH DIFFERENTIAL/PLATELET
Abs Immature Granulocytes: 0.01 10*3/uL (ref 0.00–0.07)
Basophils Absolute: 0 10*3/uL (ref 0.0–0.1)
Basophils Relative: 1 %
Eosinophils Absolute: 0.1 10*3/uL (ref 0.0–0.5)
Eosinophils Relative: 3 %
HCT: 36.5 % (ref 36.0–46.0)
Hemoglobin: 11.8 g/dL — ABNORMAL LOW (ref 12.0–15.0)
Immature Granulocytes: 0 %
Lymphocytes Relative: 33 %
Lymphs Abs: 1 10*3/uL (ref 0.7–4.0)
MCH: 29.4 pg (ref 26.0–34.0)
MCHC: 32.3 g/dL (ref 30.0–36.0)
MCV: 91 fL (ref 80.0–100.0)
Monocytes Absolute: 0.3 10*3/uL (ref 0.1–1.0)
Monocytes Relative: 11 %
Neutro Abs: 1.5 10*3/uL — ABNORMAL LOW (ref 1.7–7.7)
Neutrophils Relative %: 52 %
Platelets: 279 10*3/uL (ref 150–400)
RBC: 4.01 MIL/uL (ref 3.87–5.11)
RDW: 14.6 % (ref 11.5–15.5)
WBC: 2.9 10*3/uL — ABNORMAL LOW (ref 4.0–10.5)
nRBC: 0 % (ref 0.0–0.2)

## 2021-11-09 LAB — COMPREHENSIVE METABOLIC PANEL
ALT: 34 U/L (ref 0–44)
AST: 27 U/L (ref 15–41)
Albumin: 4.1 g/dL (ref 3.5–5.0)
Alkaline Phosphatase: 59 U/L (ref 38–126)
Anion gap: 7 (ref 5–15)
BUN: 15 mg/dL (ref 8–23)
CO2: 30 mmol/L (ref 22–32)
Calcium: 9.4 mg/dL (ref 8.9–10.3)
Chloride: 103 mmol/L (ref 98–111)
Creatinine, Ser: 0.87 mg/dL (ref 0.44–1.00)
GFR, Estimated: >60 mL/min (ref >60)
Glucose, Bld: 105 mg/dL — ABNORMAL HIGH (ref 70–99)
Potassium: 3.2 mmol/L — ABNORMAL LOW (ref 3.5–5.1)
Sodium: 140 mmol/L (ref 135–145)
Total Bilirubin: 0.4 mg/dL (ref 0.3–1.2)
Total Protein: 8.1 g/dL (ref 6.5–8.1)

## 2021-11-09 LAB — LACTATE DEHYDROGENASE: LDH: 158 U/L (ref 98–192)

## 2021-11-10 LAB — KAPPA/LAMBDA LIGHT CHAINS
Kappa free light chain: 31.5 mg/L — ABNORMAL HIGH (ref 3.3–19.4)
Kappa, lambda light chain ratio: 1.51 (ref 0.26–1.65)
Lambda free light chains: 20.9 mg/L (ref 5.7–26.3)

## 2021-11-12 LAB — IMMUNOFIXATION ELECTROPHORESIS
IgA: 232 mg/dL (ref 87–352)
IgG (Immunoglobin G), Serum: 1608 mg/dL — ABNORMAL HIGH (ref 586–1602)
IgM (Immunoglobulin M), Srm: 140 mg/dL (ref 26–217)
Total Protein ELP: 7.4 g/dL (ref 6.0–8.5)

## 2021-11-15 DIAGNOSIS — I10 Essential (primary) hypertension: Secondary | ICD-10-CM | POA: Diagnosis not present

## 2021-11-15 DIAGNOSIS — E782 Mixed hyperlipidemia: Secondary | ICD-10-CM | POA: Diagnosis not present

## 2021-11-15 DIAGNOSIS — E118 Type 2 diabetes mellitus with unspecified complications: Secondary | ICD-10-CM | POA: Diagnosis not present

## 2021-11-15 LAB — PROTEIN ELECTROPHORESIS, SERUM
A/G Ratio: 1.1 (ref 0.7–1.7)
Albumin ELP: 3.8 g/dL (ref 2.9–4.4)
Alpha-1-Globulin: 0.2 g/dL (ref 0.0–0.4)
Alpha-2-Globulin: 0.6 g/dL (ref 0.4–1.0)
Beta Globulin: 1.1 g/dL (ref 0.7–1.3)
Gamma Globulin: 1.6 g/dL (ref 0.4–1.8)
Globulin, Total: 3.6 g/dL (ref 2.2–3.9)
Total Protein ELP: 7.4 g/dL (ref 6.0–8.5)

## 2021-11-16 ENCOUNTER — Other Ambulatory Visit: Payer: Self-pay | Admitting: *Deleted

## 2021-11-16 ENCOUNTER — Encounter: Payer: Self-pay | Admitting: Hematology

## 2021-11-16 ENCOUNTER — Inpatient Hospital Stay: Payer: 59 | Attending: Hematology | Admitting: Hematology

## 2021-11-16 VITALS — BP 155/91 | HR 60 | Temp 96.4°F | Resp 20 | Ht 65.16 in | Wt 220.2 lb

## 2021-11-16 DIAGNOSIS — Z79899 Other long term (current) drug therapy: Secondary | ICD-10-CM | POA: Diagnosis not present

## 2021-11-16 DIAGNOSIS — M545 Low back pain, unspecified: Secondary | ICD-10-CM | POA: Diagnosis not present

## 2021-11-16 DIAGNOSIS — C9030 Solitary plasmacytoma not having achieved remission: Secondary | ICD-10-CM | POA: Insufficient documentation

## 2021-11-16 DIAGNOSIS — E876 Hypokalemia: Secondary | ICD-10-CM | POA: Insufficient documentation

## 2021-11-16 DIAGNOSIS — D472 Monoclonal gammopathy: Secondary | ICD-10-CM

## 2021-11-16 NOTE — Patient Instructions (Addendum)
[place] at [facility] Discharge Instructions   You were seen and examined today by Dr. Delton Coombes.  He reviewed the results of your lab work which are normal/stable.   We will see you back in 6 months.   Thank you for choosing [place] at [facility] to provide your oncology and hematology care.  To afford each patient quality time with our provider, please arrive at least 15 minutes before your scheduled appointment time.   If you have a lab appointment with the [facility] please come in thru the Main Entrance and check in at the main information desk.  You need to re-schedule your appointment should you arrive 10 or more minutes late.  We strive to give you quality time with our providers, and arriving late affects you and other patients whose appointments are after yours.  Also, if you no show three or more times for appointments you may be dismissed from the clinic at the providers discretion.     Again, thank you for choosing [facility].  Our hope is that these requests will decrease the amount of time that you wait before being seen by our physicians.       _____________________________________________________________  Should you have questions after your visit to [facility], please contact our office at [phone] and follow the prompts.  Our office hours are 8:00 a.m. and 4:30 p.m. Monday - Friday.  Please note that voicemails left after 4:00 p.m. may not be returned until the following business day.  We are closed weekends and major holidays.  You do have access to a nurse 24-7, just call the main number to the clinic [phone] and do not press any options, hold on the line and a nurse will answer the phone.    For prescription refill requests, have your pharmacy contact our office and allow 72 hours.    Due to Covid, you will need to wear a mask upon entering the hospital. If you do not have a  mask, a mask will be given to you at the Main Entrance upon arrival. For doctor visits, patients may have 1 support person age 13 or older with them. For treatment visits, patients can not have anyone with them due to social distancing guidelines and our immunocompromised population.

## 2021-11-16 NOTE — Progress Notes (Signed)
[place], [place] [address]   CLINIC:  Medical Oncology/Hematology  PCP:  Scherrie Bateman [address] / [place] [address] [phone]   REASON FOR VISIT:  Follow-up for L4 plasmacytoma   PRIOR THERAPY: XRT in 27 fractions from 07/17/2019 to 08/22/2019  NGS Results: not done  CURRENT THERAPY: surveillance  BRIEF ONCOLOGIC HISTORY:  Oncology History   No history exists.    CANCER STAGING: Cancer Staging  No matching staging information was found for the patient.  INTERVAL HISTORY:  Hannah Cooper, a 66 y.o. female, returns for routine follow-up of her L4 plasmacytoma. Hannah Cooper was last seen on 05/10/2021.   Today she reports feeling well. She continues to have back pain for which she is taking Norco prn.   REVIEW OF SYSTEMS:  Review of Systems  Constitutional:  Positive for fatigue. Negative for appetite change.  Musculoskeletal:  Positive for arthralgias (6/10 R leg) and back pain (6/10).  All other systems reviewed and are negative.   PAST MEDICAL/SURGICAL HISTORY:  Past Medical History:  Diagnosis Date   Arthritis    Atypical chest pain    chronic   Cancer (HCC)    bone cancer   Chronic back pain    Chronic diastolic CHF (congestive heart failure) (HCC)    COPD (chronic obstructive pulmonary disease) (HCC)    DDD (degenerative disc disease), cervical    Diabetes mellitus without complication ([place])    History of radiation therapy 07/16/19-08/22/19   L4 spine ; Dr. Gery Pray   Hyperlipidemia    Hypertension    Lumbar radiculopathy    Meningitis    Normal coronary arteries 2014   Palpitations    Premature atrial contractions    PVC's (premature ventricular contractions)    Past Surgical History:  Procedure Laterality Date   ABDOMINAL HYSTERECTOMY     BREAST LUMPECTOMY WITH RADIOFREQUENCY TAG IDENTIFICATION Right 01/25/2021   Procedure: BREAST LUMPECTOMY WITH RADIOFREQUENCY TAG  IDENTIFICATION;  Surgeon: Virl Cagey, MD;  Location: [facility];  Service: General;  Laterality: Right;   EXCISION OF BREAST BIOPSY Right 01/25/2021   Procedure: EXCISION OF BREAST BIOPSY;  Surgeon: Virl Cagey, MD;  Location: [facility];  Service: General;  Laterality: Right;   IR FLUORO GUIDED NEEDLE PLC ASPIRATION/INJECTION LOC  06/19/2019   LEFT HEART CATHETERIZATION WITH CORONARY ANGIOGRAM N/A 09/05/2012   Procedure: LEFT HEART CATHETERIZATION WITH CORONARY ANGIOGRAM;  Surgeon: Wellington Hampshire, MD;  Location: [facility];  Service: Cardiovascular;  Laterality: N/A;   RESECTION DISTAL CLAVICAL Left 07/03/2020   Procedure: RESECTION DISTAL CLAVICAL;  Surgeon: Carole Civil, MD;  Location: [facility];  Service: Orthopedics;  Laterality: Left;   SHOULDER OPEN ROTATOR CUFF REPAIR Left 07/03/2020   Procedure: ROTATOR CUFF REPAIR SHOULDER OPEN WITH CHROMEOPLASTY;  Surgeon: Carole Civil, MD;  Location: [facility];  Service: Orthopedics;  Laterality: Left;   SHOULDER OPEN ROTATOR CUFF REPAIR Left 07/02/2021   Procedure: ROTATOR CUFF REPAIR SHOULDER OPEN with Patch Graft;  Surgeon: Carole Civil, MD;  Location: [facility];  Service: Orthopedics;  Laterality: Left;   THYROID SURGERY  2002    SOCIAL HISTORY:  Social History   Socioeconomic History   Marital status: Married    Spouse name: Not on file   Number of children: Not on file   Years of education: Not on file   Highest education level: Not on file  Occupational History   Not on  file  Tobacco Use   Smoking status: Never   Smokeless tobacco: Never  Vaping Use   Vaping Use: Never used  Substance and Sexual Activity   Alcohol use: No    Alcohol/week: 0.0 standard drinks of alcohol   Drug use: No   Sexual activity: Yes    Partners: Male  Other Topics Concern   Not on file  Social History Narrative   Not on file   Social Determinants of Health   Financial Resource Strain: Low Risk  (05/28/2019)   Overall Financial  Resource Strain (CARDIA)    Difficulty of Paying Living Expenses: Not hard at all  Food Insecurity: No Food Insecurity (05/28/2019)   Hunger Vital Sign    Worried About Running Out of Food in the Last Year: Never true    [place] in the Last Year: Never true  Transportation Needs: No Transportation Needs (05/28/2019)   PRAPARE - Hydrologist (Medical): No    Lack of Transportation (Non-Medical): No  Physical Activity: Inactive (05/28/2019)   Exercise Vital Sign    Days of Exercise per Week: 0 days    Minutes of Exercise per Session: 0 min  Stress: Stress Concern Present (05/28/2019)   [place]    Feeling of Stress : To some extent  Social Connections: Socially Integrated (05/28/2019)   Social Connection and Isolation Panel [NHANES]    Frequency of Communication with Friends and Family: More than three times a week    Frequency of Social Gatherings with Friends and Family: Never    Attends Religious Services: More than 4 times per year    Active Member of [employer] or Organizations: Yes    Attends Music therapist: More than 4 times per year    Marital Status: Married  Human resources officer Violence: Not At Risk (05/28/2019)   Humiliation, Afraid, Rape, and Kick questionnaire    Fear of Current or Ex-Partner: No    Emotionally Abused: No    Physically Abused: No    Sexually Abused: No    FAMILY HISTORY:  Family History  Problem Relation Age of Onset   Heart attack Mother 73   Heart attack Sister 64   Breast cancer Maternal Grandmother    Breast cancer Cousin     CURRENT MEDICATIONS:  Current Outpatient Medications  Medication Sig Dispense Refill   albuterol (PROVENTIL) (2.5 MG/3ML) 0.083% nebulizer solution Take 2.5 mg by nebulization every 6 (six) hours as needed for wheezing or shortness of breath.     albuterol (VENTOLIN HFA) 108 (90 Base) MCG/ACT inhaler Inhale 1-2 puffs  into the lungs every 4 (four) hours as needed for wheezing or shortness of breath. 18 g 0   Apoaequorin (PREVAGEN) 10 MG CAPS Take 10 mg by mouth daily.     benzonatate (TESSALON) 200 MG capsule Take 200 mg by mouth 3 (three) times daily.     cetirizine (ZYRTEC) 10 MG tablet Take 10 mg by mouth daily.     chlorthalidone (HYGROTON) 50 MG tablet Take 1 tablet by mouth once daily 90 tablet 1   diltiazem (CARDIZEM) 60 MG tablet Take 1 tablet (60 mg total) by mouth 2 (two) times daily. (MAY TAKE AN ADDITIONAL TAB AS NEEDED FOR PALPITATIONS) 180 tablet 3   furosemide (LASIX) 20 MG tablet Take 1 tablet daily as needed may take 2 tablets prn (Patient taking differently: Take 20-40 mg by mouth daily as needed  for edema.) 60 tablet 1   glipiZIDE-metformin (METAGLIP) 5-500 MG tablet Take 1 tablet by mouth in the morning and at bedtime.     HYDROcodone-acetaminophen (NORCO) 10-325 MG tablet Take 1 tablet by mouth every 6 (six) hours as needed for moderate pain. 30 tablet 0   ibuprofen (ADVIL) 800 MG tablet Take 1 tablet (800 mg total) by mouth every 8 (eight) hours as needed. 90 tablet 1   Insulin Glargine (BASAGLAR KWIKPEN) 100 UNIT/ML Inject 15 Units into the skin at bedtime as needed (blood sugar over 150).     isosorbide mononitrate (IMDUR) 30 MG 24 hr tablet TAKE 1 TABLET (30 MG TOTAL) BY MOUTH DAILY. 90 tablet 0   lidocaine (LIDODERM) 5 % Place 2 patches onto the skin daily. Remove & Discard patch within 12 hours or as directed by MD (Patient taking differently: Place 2 patches onto the skin daily as needed (pain). Remove & Discard patch within 12 hours or as directed by MD) 60 patch 1   losartan (COZAAR) 100 MG tablet Take 100 mg by mouth daily.     metoprolol succinate (TOPROL-XL) 50 MG 24 hr tablet Take 1 tablet (50 mg total) by mouth daily. 90 tablet 3   Multiple Vitamin (MULTIVITAMIN WITH MINERALS) TABS tablet Take 1 tablet by mouth daily.     nitroGLYCERIN (NITROSTAT) 0.4 MG SL tablet Place 0.4 mg  under the tongue every 5 (five) minutes as needed for chest pain.     oxyCODONE (ROXICODONE) 5 MG immediate release tablet Take 1 tablet (5 mg total) by mouth daily as needed for severe pain or breakthrough pain. 30 tablet 0   potassium chloride SA (KLOR-CON M) 20 MEQ tablet Take 20 mEq by mouth daily.     RELION PEN NEEDLES 32G X 4 MM MISC      rosuvastatin (CRESTOR) 10 MG tablet Take 10 mg by mouth at bedtime.     TURMERIC PO Take 600 mg by mouth daily.     No current facility-administered medications for this visit.    ALLERGIES:  Allergies  Allergen Reactions   Other Swelling    [place] lipstick    PHYSICAL EXAM:  Performance status (ECOG): 1 - Symptomatic but completely ambulatory  There were no vitals filed for this visit. Wt Readings from Last 3 Encounters:  09/20/21 229 lb (103.9 kg)  08/19/21 229 lb (103.9 kg)  08/12/21 227 lb (103 kg)   Physical Exam   LABORATORY DATA:  I have reviewed the labs as listed.     Latest Ref Rng & Units 11/09/2021    8:12 AM 06/29/2021    9:30 AM 05/03/2021    9:58 AM  CBC  WBC 4.0 - 10.5 K/uL 2.9  3.1  3.5   Hemoglobin 12.0 - 15.0 g/dL 11.8  11.9  11.8   Hematocrit 36.0 - 46.0 % 36.5  35.6  36.5   Platelets 150 - 400 K/uL 279  319  345       Latest Ref Rng & Units 11/09/2021    8:12 AM 06/29/2021    9:30 AM 05/03/2021    9:58 AM  CMP  Glucose 70 - 99 mg/dL 105  113  117   BUN 8 - 23 mg/dL 15  14  15    Creatinine 0.44 - 1.00 mg/dL 0.87  0.87  0.87   Sodium 135 - 145 mmol/L 140  139  137   Potassium 3.5 - 5.1 mmol/L 3.2  3.6  3.4   Chloride  98 - 111 mmol/L 103  100  99   CO2 22 - 32 mmol/L 30  30  30    Calcium 8.9 - 10.3 mg/dL 9.4  9.6  9.7   Total Protein 6.5 - 8.1 g/dL 8.1   7.9   Total Bilirubin 0.3 - 1.2 mg/dL 0.4   0.6   Alkaline Phos 38 - 126 U/L 59   61   AST 15 - 41 U/L 27   20   ALT 0 - 44 U/L 34   26     DIAGNOSTIC IMAGING:  I have independently reviewed the scans and discussed with the patient. DG Bone Survey  Met  Result Date: 11/09/2021 CLINICAL DATA:  Monoclonal gammopathy EXAM: METASTATIC BONE SURVEY COMPARISON:  None Available. FINDINGS: No focal lytic lesions are seen. Degenerative changes are noted in cervical, thoracic and lumbar spine. Degenerative changes are noted in both knees. Degenerative changes are noted in right AC joint. Defect in the lateral end of left clavicle may be residual from previous injury. There is no focal pulmonary consolidation in the lung fields. Small transverse linear densities in the right lower lung field may suggest minimal scarring. IMPRESSION: There are no focal lytic or sclerotic lesions in bony structures. Electronically Signed   By: Elmer Picker M.D.   On: 11/09/2021 16:12     ASSESSMENT:  1.  Plasmacytoma of L4 vertebral body: -MRI lumbar spine shows L4 lesion. -PET scan on 06/10/2019 shows mildly increased FDG uptake associated with mixed lytic/sclerotic lesion involving L4 vertebral body SUV 4.4. -Serum immunofixation shows IgG kappa monoclonal protein.  SPEP-no M spike.  Kappa light chains elevated at 25.3.  Ratio 1.12.  Lambda light chains 21.9.  Beta-2 microglobulin 2.2. -24-hour urine was negative for UPEP and immunofixation.  Protein was 130 mg. -L4 needle biopsy on 06/19/2019 showed minute segments of the bone and soft tissue, limited cellularity.  IHC for CD138 highlights scattering plasma cells.  Cytokeratin is negative.  Few kappa positive plasma cells present.  Overall material is very limited and essentially nondiagnostic. -Clinically this is consistent with plasmacytoma.  We reviewed bone marrow biopsy results which showed normocellular marrow.  Cytogenetics are normal. -XRT to L4 vertebral body from 07/16/2019 through 08/22/2019. -MRI of the lumbar spine on 12/03/2019 showed unchanged size of the L4 vertebral body lesion, slightly decreased contrast-enhancement with no new lesions.   2.  Low back pain: -She is taking half tablet of hydrocodone  10/325 every 6 hours as needed which is helping. -Likely this will improve upon completion of radiation.  3.  Right breast intraductal papilloma: - Biopsy on 12/01/2020 at 3 o'clock position of the right breast with intraductal papilloma with florid usual ductal hyperplasia and sclerosing fibrosis. - Right breast lumpectomy on 01/25/2021 with intraductal papilloma with florid UDH and calcifications 1.2 cm.  Margins negative.   PLAN:  1.  Plasmacytoma of L4 vertebral body: - She does not report any new onset pains.  No infections recently. - Reviewed labs from 11/09/2021: SPEP is negative.  Immunofixation was normal.  Kappa light chains are slightly high at 31.5 but improved from last value.  Ratio is normal.  Mild leukopenia is stable.  LFTs are normal with normal creatinine and calcium. - No "crab" features.  Reviewed skeletal survey from 11/09/2021 with no lytic lesions. - Return to clinic in 6 months with repeat labs.   2.  Low back pain: - Continue hydrocodone 5/325 as needed.  She is not requiring it daily.  3.  Severe hypokalemia: - Continue potassium supplements.  4.  Right breast intraductal papilloma and florid UDH: - Continue yearly mammograms.   Orders placed this encounter:  No orders of the defined types were placed in this encounter.    Derek Jack, MD [place] [phone]   I, Thana Ates, am acting as a scribe for Dr. Derek Jack.  I, Derek Jack MD, have reviewed the above documentation for accuracy and completeness, and I agree with the above.

## 2021-12-16 DIAGNOSIS — I10 Essential (primary) hypertension: Secondary | ICD-10-CM | POA: Diagnosis not present

## 2021-12-16 DIAGNOSIS — E118 Type 2 diabetes mellitus with unspecified complications: Secondary | ICD-10-CM | POA: Diagnosis not present

## 2021-12-16 DIAGNOSIS — E782 Mixed hyperlipidemia: Secondary | ICD-10-CM | POA: Diagnosis not present

## 2021-12-22 ENCOUNTER — Other Ambulatory Visit (HOSPITAL_COMMUNITY): Payer: Self-pay | Admitting: Hematology

## 2021-12-22 ENCOUNTER — Other Ambulatory Visit: Payer: Self-pay | Admitting: *Deleted

## 2021-12-22 DIAGNOSIS — C9030 Solitary plasmacytoma not having achieved remission: Secondary | ICD-10-CM

## 2021-12-22 MED ORDER — OXYCODONE HCL 5 MG PO TABS: 5.0000 mg | ORAL_TABLET | Freq: Every day | ORAL | 0 refills | Status: DC | PRN

## 2021-12-23 DIAGNOSIS — M1611 Unilateral primary osteoarthritis, right hip: Secondary | ICD-10-CM | POA: Diagnosis not present

## 2021-12-23 DIAGNOSIS — R7309 Other abnormal glucose: Secondary | ICD-10-CM | POA: Diagnosis not present

## 2021-12-23 DIAGNOSIS — I10 Essential (primary) hypertension: Secondary | ICD-10-CM | POA: Diagnosis not present

## 2021-12-23 DIAGNOSIS — D509 Iron deficiency anemia, unspecified: Secondary | ICD-10-CM | POA: Diagnosis not present

## 2021-12-23 DIAGNOSIS — Z0001 Encounter for general adult medical examination with abnormal findings: Secondary | ICD-10-CM | POA: Diagnosis not present

## 2021-12-23 DIAGNOSIS — E782 Mixed hyperlipidemia: Secondary | ICD-10-CM | POA: Diagnosis not present

## 2021-12-23 DIAGNOSIS — M5432 Sciatica, left side: Secondary | ICD-10-CM | POA: Diagnosis not present

## 2021-12-23 DIAGNOSIS — E119 Type 2 diabetes mellitus without complications: Secondary | ICD-10-CM | POA: Diagnosis not present

## 2021-12-23 DIAGNOSIS — Z1331 Encounter for screening for depression: Secondary | ICD-10-CM | POA: Diagnosis not present

## 2021-12-23 DIAGNOSIS — Z6841 Body Mass Index (BMI) 40.0 and over, adult: Secondary | ICD-10-CM | POA: Diagnosis not present

## 2021-12-30 ENCOUNTER — Encounter: Payer: Self-pay | Admitting: Orthopedic Surgery

## 2021-12-30 ENCOUNTER — Ambulatory Visit (INDEPENDENT_AMBULATORY_CARE_PROVIDER_SITE_OTHER): Payer: 59 | Admitting: Orthopedic Surgery

## 2021-12-30 DIAGNOSIS — Z9889 Other specified postprocedural states: Secondary | ICD-10-CM

## 2021-12-30 DIAGNOSIS — M5136 Other intervertebral disc degeneration, lumbar region: Secondary | ICD-10-CM | POA: Diagnosis not present
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
## 2022-01-15 DIAGNOSIS — E782 Mixed hyperlipidemia: Secondary | ICD-10-CM | POA: Diagnosis not present

## 2022-01-15 DIAGNOSIS — E118 Type 2 diabetes mellitus with unspecified complications: Secondary | ICD-10-CM | POA: Diagnosis not present

## 2022-01-15 DIAGNOSIS — I10 Essential (primary) hypertension: Secondary | ICD-10-CM | POA: Diagnosis not present

## 2022-01-18 ENCOUNTER — Other Ambulatory Visit: Payer: Self-pay | Admitting: Radiology

## 2022-01-18 DIAGNOSIS — M5136 Other intervertebral disc degeneration, lumbar region: Secondary | ICD-10-CM

## 2022-01-18 DIAGNOSIS — M25551 Pain in right hip: Secondary | ICD-10-CM

## 2022-01-18 DIAGNOSIS — M75122 Complete rotator cuff tear or rupture of left shoulder, not specified as traumatic: Secondary | ICD-10-CM

## 2022-01-18 DIAGNOSIS — G8929 Other chronic pain: Secondary | ICD-10-CM

## 2022-01-18 DIAGNOSIS — G8918 Other acute postprocedural pain: Secondary | ICD-10-CM

## 2022-01-18 NOTE — Telephone Encounter (Signed)
Patient called, requesting refill hydrocodone to [facility].

## 2022-01-18 NOTE — Telephone Encounter (Signed)
Patient called back, I told her the medication request was denied.  She asks if there is another medication she can take?  This is for her hip.  She does not want an injection.  Please advise.

## 2022-01-19 NOTE — Telephone Encounter (Signed)
We can do  1rx and send her to pain management

## 2022-01-20 MED ORDER — HYDROCODONE-ACETAMINOPHEN 10-325 MG PO TABS: 1.0000 | ORAL_TABLET | Freq: Four times a day (QID) | ORAL | 0 refills | Status: DC | PRN

## 2022-01-20 NOTE — Addendum Note (Signed)
Addended by: Brand Males E on: 01/20/2022 03:41 PM   Modules accepted: Orders

## 2022-02-17 ENCOUNTER — Other Ambulatory Visit (HOSPITAL_COMMUNITY)
Admission: RE | Admit: 2022-02-17 | Discharge: 2022-02-17 | Disposition: A | Discharge status: Home / Self Care | Payer: HMO | Source: Ambulatory Visit | Attending: Cardiology | Admitting: Cardiology

## 2022-02-17 ENCOUNTER — Ambulatory Visit: Payer: HMO | Attending: Cardiology | Admitting: Cardiology

## 2022-02-17 ENCOUNTER — Encounter: Payer: Self-pay | Admitting: Cardiology

## 2022-02-17 ENCOUNTER — Encounter: Payer: Self-pay | Admitting: Family Medicine

## 2022-02-17 VITALS — BP 130/80 | HR 53 | Ht 65.0 in | Wt 226.0 lb

## 2022-02-17 DIAGNOSIS — R002 Palpitations: Secondary | ICD-10-CM

## 2022-02-17 DIAGNOSIS — I10 Essential (primary) hypertension: Secondary | ICD-10-CM | POA: Diagnosis not present

## 2022-02-17 DIAGNOSIS — I5032 Chronic diastolic (congestive) heart failure: Secondary | ICD-10-CM | POA: Insufficient documentation

## 2022-02-17 LAB — BASIC METABOLIC PANEL
Anion gap: 11 (ref 5–15)
BUN: 22 mg/dL (ref 8–23)
CO2: 29 mmol/L (ref 22–32)
Calcium: 9.4 mg/dL (ref 8.9–10.3)
Chloride: 95 mmol/L — ABNORMAL LOW (ref 98–111)
Creatinine, Ser: 0.95 mg/dL (ref 0.44–1.00)
GFR, Estimated: >60 mL/min (ref >60)
Glucose, Bld: 307 mg/dL — ABNORMAL HIGH (ref 70–99)
Potassium: 3.2 mmol/L — ABNORMAL LOW (ref 3.5–5.1)
Sodium: 135 mmol/L (ref 135–145)

## 2022-02-17 LAB — MAGNESIUM: Magnesium: 1.5 mg/dL — ABNORMAL LOW (ref 1.7–2.4)

## 2022-02-17 MED ORDER — ISOSORBIDE MONONITRATE ER 30 MG PO TB24: 30.0000 mg | ORAL_TABLET | Freq: Every day | ORAL | 3 refills | Status: DC

## 2022-02-17 MED ORDER — CHLORTHALIDONE 50 MG PO TABS: 50.0000 mg | ORAL_TABLET | Freq: Every day | ORAL | 3 refills | Status: DC

## 2022-02-17 MED ORDER — DILTIAZEM HCL 60 MG PO TABS: 60.0000 mg | ORAL_TABLET | Freq: Two times a day (BID) | ORAL | 3 refills | Status: DC

## 2022-02-17 MED ORDER — FUROSEMIDE 20 MG PO TABS: ORAL_TABLET | ORAL | 3 refills | Status: AC

## 2022-02-17 MED ORDER — METOPROLOL SUCCINATE ER 50 MG PO TB24: 50.0000 mg | ORAL_TABLET | Freq: Every day | ORAL | 3 refills | Status: DC

## 2022-02-17 MED ORDER — LOSARTAN POTASSIUM 100 MG PO TABS: 100.0000 mg | ORAL_TABLET | Freq: Every day | ORAL | 3 refills | Status: DC

## 2022-02-17 MED ORDER — ROSUVASTATIN CALCIUM 10 MG PO TABS: 10.0000 mg | ORAL_TABLET | Freq: Every day | ORAL | 3 refills | Status: DC

## 2022-02-17 NOTE — Patient Instructions (Signed)
Medication Instructions:  Your physician recommends that you continue on your current medications as directed. Please refer to the Current Medication list given to you today.   Labwork: None  Testing/Procedures: None  Follow-Up: Follow up in 6 months with Dr. Harl Bowie  Any Other Special Instructions Will Be Listed Below (If Applicable).     If you need a refill on your cardiac medications before your next appointment, please call your pharmacy.

## 2022-02-17 NOTE — Progress Notes (Signed)
Clinical Summary Hannah Cooper is a 66 y.o.female   seen today for follow up of the following medical problems.      1. Palpitations - 2018 monitor PAC and PVCs - has been on diltiazem and lopressor - some ongoing palpitatoins at times - no caffeine, no EtOH. Has had some recent stress due to loss of family members including recently her father.     - occasional palpitations, 2-3 times per week for short time.       last visit we increased dilt to '60mg'$  bid and low toprol to '50mg'$  daily due to palpitations, fatigue   - rare infrequent palpitations. Compliant with diltiazem, taking toprol '50mg'$  daily.    2. HTN - remains compliant with meds   3. Chest pain - long history of atypical chest pain - cath 2014 with patent coronaries. -  admit 12/2014 with chest pain, negative evaluation for ACS.     - chronic symptoms unchanged since last visit  - 06/2020 nuclear stresss no clear ischemia.   - no recent chest pain   4. COPD - noted on 12/2013 PFTs - followed Dr Luan Pulling          6. Chronic diastolic heart failure - echo 06/2013 LVEF 55-60%, abnormal diastolic function.     - no recent edema. Takes lasix prn, about 1-2 times per week. She is on daily chorlthalidone for bp  - some swelling at times, takes lasix about 2 times a week.  11-25-2021 K was    6. Dyspnea - had CPX 06/2014, showed mild to mod reduced functoinal capacity       7. IgG kappa monoclonal gammopathy and bony lesion - both followed by heme/onc   8. Hypokalemia - she reports addressed by pcp with recent normal labs    9. Fatigue - no issues sleeping throught night -    SH: works at [employer] for kids. She has 3 adopted kids (15,16,17). Great grand baby is 25 year old   Father just passed away 2020/11/25 Past Medical History:  Diagnosis Date   Arthritis    Atypical chest pain    chronic   Cancer (HCC)    bone cancer   Chronic back pain    Chronic diastolic CHF (congestive heart  failure) (HCC)    COPD (chronic obstructive pulmonary disease) (HCC)    DDD (degenerative disc disease), cervical    Diabetes mellitus without complication ([place])    History of radiation therapy 07/16/19-08/22/19   L4 spine ; Dr. Gery Pray   Hyperlipidemia    Hypertension    Lumbar radiculopathy    Meningitis    Normal coronary arteries 2014   Palpitations    Premature atrial contractions    PVC's (premature ventricular contractions)      Allergies  Allergen Reactions   Other Swelling    Avon lipstick     Current Outpatient Medications  Medication Sig Dispense Refill   albuterol (PROVENTIL) (2.5 MG/3ML) 0.083% nebulizer solution Take 2.5 mg by nebulization every 6 (six) hours as needed for wheezing or shortness of breath.     albuterol (VENTOLIN HFA) 108 (90 Base) MCG/ACT inhaler Inhale 1-2 puffs into the lungs every 4 (four) hours as needed for wheezing or shortness of breath. 18 g 0   Apoaequorin (PREVAGEN) 10 MG CAPS Take 10 mg by mouth daily.     benzonatate (TESSALON) 200 MG capsule Take 200 mg by mouth 3 (three) times daily.  cetirizine (ZYRTEC) 10 MG tablet Take 10 mg by mouth daily.     chlorthalidone (HYGROTON) 50 MG tablet Take 1 tablet by mouth once daily 90 tablet 1   diltiazem (CARDIZEM) 60 MG tablet Take 1 tablet (60 mg total) by mouth 2 (two) times daily. (MAY TAKE AN ADDITIONAL TAB AS NEEDED FOR PALPITATIONS) 180 tablet 3   furosemide (LASIX) 20 MG tablet Take 1 tablet daily as needed may take 2 tablets prn (Patient taking differently: Take 20-40 mg by mouth daily as needed for edema.) 60 tablet 1   glipiZIDE-metformin (METAGLIP) 5-500 MG tablet Take 1 tablet by mouth in the morning and at bedtime.     HYDROcodone-acetaminophen (NORCO) 10-325 MG tablet Take 1 tablet by mouth every 6 (six) hours as needed for moderate pain. 30 tablet 0   ibuprofen (ADVIL) 800 MG tablet Take 1 tablet (800 mg total) by mouth every 8 (eight) hours as needed. 90 tablet 1   Insulin  Glargine (BASAGLAR KWIKPEN) 100 UNIT/ML Inject 15 Units into the skin at bedtime as needed (blood sugar over 150).     isosorbide mononitrate (IMDUR) 30 MG 24 hr tablet TAKE 1 TABLET (30 MG TOTAL) BY MOUTH DAILY. 90 tablet 0   lidocaine (LIDODERM) 5 % Place 2 patches onto the skin daily. Remove & Discard patch within 12 hours or as directed by MD (Patient taking differently: Place 2 patches onto the skin daily as needed (pain). Remove & Discard patch within 12 hours or as directed by MD) 60 patch 1   losartan (COZAAR) 100 MG tablet Take 100 mg by mouth daily.     metoprolol succinate (TOPROL-XL) 50 MG 24 hr tablet Take 1 tablet (50 mg total) by mouth daily. 90 tablet 3   Multiple Vitamin (MULTIVITAMIN WITH MINERALS) TABS tablet Take 1 tablet by mouth daily.     nitroGLYCERIN (NITROSTAT) 0.4 MG SL tablet Place 0.4 mg under the tongue every 5 (five) minutes as needed for chest pain.     oxyCODONE (ROXICODONE) 5 MG immediate release tablet Take 1 tablet (5 mg total) by mouth daily as needed for severe pain or breakthrough pain. 30 tablet 0   potassium chloride SA (KLOR-CON M) 20 MEQ tablet Take 20 mEq by mouth daily.     RELION PEN NEEDLES 32G X 4 MM MISC      rosuvastatin (CRESTOR) 10 MG tablet Take 10 mg by mouth at bedtime.     TURMERIC PO Take 600 mg by mouth daily.     No current facility-administered medications for this visit.     Past Surgical History:  Procedure Laterality Date   ABDOMINAL HYSTERECTOMY     BREAST LUMPECTOMY WITH RADIOFREQUENCY TAG IDENTIFICATION Right 01/25/2021   Procedure: BREAST LUMPECTOMY WITH RADIOFREQUENCY TAG IDENTIFICATION;  Surgeon: Virl Cagey, MD;  Location: [facility];  Service: General;  Laterality: Right;   EXCISION OF BREAST BIOPSY Right 01/25/2021   Procedure: EXCISION OF BREAST BIOPSY;  Surgeon: Virl Cagey, MD;  Location: [facility];  Service: General;  Laterality: Right;   IR FLUORO GUIDED NEEDLE PLC ASPIRATION/INJECTION LOC  06/19/2019   LEFT  HEART CATHETERIZATION WITH CORONARY ANGIOGRAM N/A 09/05/2012   Procedure: LEFT HEART CATHETERIZATION WITH CORONARY ANGIOGRAM;  Surgeon: Wellington Hampshire, MD;  Location: [facility];  Service: Cardiovascular;  Laterality: N/A;   RESECTION DISTAL CLAVICAL Left 07/03/2020   Procedure: RESECTION DISTAL CLAVICAL;  Surgeon: Carole Civil, MD;  Location: [facility];  Service: Orthopedics;  Laterality: Left;  SHOULDER OPEN ROTATOR CUFF REPAIR Left 07/03/2020   Procedure: ROTATOR CUFF REPAIR SHOULDER OPEN WITH CHROMEOPLASTY;  Surgeon: Carole Civil, MD;  Location: [facility];  Service: Orthopedics;  Laterality: Left;   SHOULDER OPEN ROTATOR CUFF REPAIR Left 07/02/2021   Procedure: ROTATOR CUFF REPAIR SHOULDER OPEN with Patch Graft;  Surgeon: Carole Civil, MD;  Location: [facility];  Service: Orthopedics;  Laterality: Left;   THYROID SURGERY  2002     Allergies  Allergen Reactions   Other Swelling    Avon lipstick      Family History  Problem Relation Age of Onset   Heart attack Mother 53   Heart attack Sister 36   Breast cancer Maternal Grandmother    Breast cancer Cousin      Social History Hannah Cooper reports that she has never smoked. She has never used smokeless tobacco. Hannah Cooper reports no history of alcohol use.   Review of Systems CONSTITUTIONAL: No weight loss, fever, chills, weakness or fatigue.  HEENT: Eyes: No visual loss, blurred vision, double vision or yellow sclerae.No hearing loss, sneezing, congestion, runny nose or sore throat.  SKIN: No rash or itching.  CARDIOVASCULAR: per hpi RESPIRATORY: No shortness of breath, cough or sputum.  GASTROINTESTINAL: No anorexia, nausea, vomiting or diarrhea. No abdominal pain or blood.  GENITOURINARY: No burning on urination, no polyuria NEUROLOGICAL: No headache, dizziness, syncope, paralysis, ataxia, numbness or tingling in the extremities. No change in bowel or bladder control.  MUSCULOSKELETAL: No muscle, back pain, joint  pain or stiffness.  LYMPHATICS: No enlarged nodes. No history of splenectomy.  PSYCHIATRIC: No history of depression or anxiety.  ENDOCRINOLOGIC: No reports of sweating, cold or heat intolerance. No polyuria or polydipsia.  Marland Kitchen   Physical Examination Today's Vitals   02/17/22 0845  BP: 130/80  Pulse: (!) 53  Weight: 226 lb (102.5 kg)  Height: '5\' 5"'$  (1.651 m)   Body mass index is 37.61 kg/m.  Gen: resting comfortably, no acute distress HEENT: no scleral icterus, pupils equal round and reactive, no palptable cervical adenopathy,  CV: regular, brady 55, no m/r/g, no jvd Resp: Clear to auscultation bilaterally GI: abdomen is soft, non-tender, non-distended, normal bowel sounds, no hepatosplenomegaly MSK: extremities are warm, no edema.  Skin: warm, no rash Neuro:  no focal deficits Psych: appropriate affect   Diagnostic Studies  08/2012 cath Hemodynamics: AO:  164/82   mmHg LV:  169/14    mmHg LVEDP: 24  mmHg   Coronary angiography: Coronary dominance: Right    Left Main:  Normal Left Anterior Descending (LAD):  Normal in size with no significant disease. 1st diagonal (D1):  Small in size with minor irregularities. 2nd diagonal (D2):  Normal in size with no significant disease. 3rd diagonal (D3):  Normal in size with no significant disease. Circumflex (LCx):  Normal in size and nondominant. The vessel has no significant disease. 1st obtuse marginal:  Small in size with no significant disease. 2nd obtuse marginal:  Small in size with no significant disease.  3rd obtuse marginal:  Large in size with no significant disease.   Right Coronary Artery: Normal in size and dominant. The vessel has no significant disease. posterior descending artery: Normal in size with no significant disease. posterior lateral branchs:  Normal in size with no significant disease.   Left ventriculography: Left ventricular systolic function is normal , LVEF is estimated at 60-65% %, there is no  significant mitral regurgitation    Final Conclusions:    1. Normal  coronary arteries. 2. Normal LV systolic function. 3. Moderately elevated left ventricular end-diastolic pressure likely due to diastolic heart failure.     06/2013 echo Study Conclusions  - Procedure narrative: Transthoracic echocardiography. Image   quality was suboptimal. The study was technically   difficult, as a result of poor sound wave transmission and   restricted patient mobility. - Left ventricle: The cavity size was normal. Wall thickness   was increased in a pattern of mild to moderate LVH.   Diastolic dysfunction noted, grade indeterminant. Systolic   function was normal. The estimated ejection fraction was   in the range of 55% to 60%. Wall motion was normal; there   were no regional wall motion abnormalities.     Jan 2017 Event monitor: no arrhythmias   06/2014 CPX Conclusion: Exercise testing with gas exchange demonstrates a mild-moderately reduced functional capacity when compared to matched sedentary norms. There was an in-adequate HR response to the exercise response and flat O2 pulse. This high HR reserve was likely high due to pulmonary limitations based on restrictive spirometry and RR 45 at peak exercise and reaching maximum ventilatory limits. However, it could have easily been due to poor effort. The sub-maximal effort is clouding interpretation of the test. Nevertheless, correlation of resting spirometry test  Showing restriction with outpatient pulmonary function test is warranted     05/2016 heart monitor Telemetry tracings predominately show sinus rhythm Reported symptoms correlate with occasional PVCs and PACs       02/2016 echo Study Conclusions   - Left ventricle: The cavity size was normal. Systolic function was   vigorous. The estimated ejection fraction was in the range of 65%   to 70%. Wall motion was normal; there were no regional wall   motion abnormalities. There  was an increased relative   contribution of atrial contraction to ventricular filling.   Doppler parameters are consistent with abnormal left ventricular   relaxation (grade 1 diastolic dysfunction). - Pulmonic valve: There was trivial regurgitation. - Pulmonary arteries: Systolic pressure could not be accurately   estimated.     Assessment and Plan   1. Palpitations - overall doing well on current regimen, will continue current meds     2.HTN - bp is at goal, continue current meds   3. Chronic diastolic HF - euvoelmic today, continue current meds  4. Hypokalemia - needs updated bmet/mg - mixed compliance with KCl due to pill size, advised ok to break in half.    F/u 6 months     Arnoldo Lenis, M.D.

## 2022-03-01 ENCOUNTER — Other Ambulatory Visit: Payer: Self-pay

## 2022-03-01 MED ORDER — MAGNESIUM OXIDE -MG SUPPLEMENT 400 (240 MG) MG PO TABS: ORAL_TABLET | ORAL | 1 refills | Status: DC

## 2022-03-02 ENCOUNTER — Telehealth: Payer: Self-pay

## 2022-03-02 NOTE — Telephone Encounter (Signed)
Error

## 2022-03-08 ENCOUNTER — Telehealth: Payer: Self-pay

## 2022-03-08 MED ORDER — POTASSIUM CHLORIDE CRYS ER 20 MEQ PO TBCR: 20.0000 meq | EXTENDED_RELEASE_TABLET | Freq: Every day | ORAL | 3 refills | Status: DC

## 2022-03-08 NOTE — Telephone Encounter (Signed)
-----   Message from Laurine Blazer, LPN sent at [id number]/41/3244  3:30 PM EST -----  ----- Message ----- From: Arnoldo Lenis, MD Sent: 03/01/2022   2:41 PM EST To: Laurine Blazer, LPN  Potassium and magnesium levels were low. Has she been able to take her potassium more regularly. Can she start magnesium oxide '400mg'$  bid x 5 days, then '400mg'$  daily  Zandra Abts MD

## 2022-03-08 NOTE — Telephone Encounter (Signed)
Patient notified and verbalized understanding. Patient stated she was able to take potassium on a regular basis, but needs r/f. Patient agreeable to starting Mag-Ox 400 mg as directed- refill sent for potassium. PCP copied.

## 2022-04-12 ENCOUNTER — Other Ambulatory Visit: Payer: Self-pay | Admitting: *Deleted

## 2022-04-12 DIAGNOSIS — C9030 Solitary plasmacytoma not having achieved remission: Secondary | ICD-10-CM

## 2022-04-12 MED ORDER — OXYCODONE HCL 5 MG PO TABS: 5.0000 mg | ORAL_TABLET | Freq: Every day | ORAL | 0 refills | Status: DC | PRN

## 2022-05-05 ENCOUNTER — Ambulatory Visit
Admission: EM | Admit: 2022-05-05 | Discharge: 2022-05-05 | Disposition: A | Discharge status: Home / Self Care | Payer: No Typology Code available for payment source | Attending: Family Medicine | Admitting: Family Medicine

## 2022-05-05 DIAGNOSIS — J069 Acute upper respiratory infection, unspecified: Secondary | ICD-10-CM | POA: Diagnosis not present

## 2022-05-05 DIAGNOSIS — J111 Influenza due to unidentified influenza virus with other respiratory manifestations: Secondary | ICD-10-CM | POA: Diagnosis not present

## 2022-05-05 DIAGNOSIS — Z1152 Encounter for screening for COVID-19: Secondary | ICD-10-CM | POA: Insufficient documentation

## 2022-05-05 MED ORDER — ACETAMINOPHEN 500 MG PO TABS
1000.0000 mg | ORAL_TABLET | Freq: Once | ORAL | Status: AC
  Administered 2022-05-05: 1000 mg via ORAL

## 2022-05-05 MED ORDER — PROMETHAZINE-DM 6.25-15 MG/5ML PO SYRP: 5.0000 mL | ORAL_SOLUTION | Freq: Four times a day (QID) | ORAL | 0 refills | Status: DC | PRN

## 2022-05-05 MED ORDER — ACETAMINOPHEN 325 MG PO TABS: 650.0000 mg | ORAL_TABLET | Freq: Once | ORAL | Status: DC

## 2022-05-05 MED ORDER — OSELTAMIVIR PHOSPHATE 75 MG PO CAPS: 75.0000 mg | ORAL_CAPSULE | Freq: Two times a day (BID) | ORAL | 0 refills | Status: DC

## 2022-05-05 NOTE — ED Triage Notes (Signed)
Pt reports body aches, headaches, fever x 1 day.

## 2022-05-05 NOTE — ED Provider Notes (Signed)
[facility]    CSN: [id number] Arrival date & time: 05/05/22  1623      History   Chief Complaint No chief complaint on file.   HPI Hannah Cooper is a 67 y.o. female.   Patient presenting today with 1 day history of high fever, chills, body aches, fatigue, headaches, mild nasal congestion and scratchy throat.  Denies cough, chest pain, shortness of breath, abdominal pain, nausea vomiting or diarrhea.  So far not tried anything over-the-counter for symptoms.  No known sick contacts recently.    Past Medical History:  Diagnosis Date   Arthritis    Atypical chest pain    chronic   Cancer (HCC)    bone cancer   Chronic back pain    Chronic diastolic CHF (congestive heart failure) (HCC)    COPD (chronic obstructive pulmonary disease) (HCC)    DDD (degenerative disc disease), cervical    Diabetes mellitus without complication ([place])    History of radiation therapy 07/16/19-08/22/19   L4 spine ; Dr. 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   Plasma cell disorder 06/10/2019   Lesion of bone of lumbosacral spine 05/28/2019   Left-sided weakness 03/13/2016   Cerebrovascular accident (CVA) ([place])    Palpitations    COPD (chronic obstructive pulmonary disease) ([place]) 08/20/2014   Dyspnea 12/19/2013   Chronic diastolic CHF (congestive heart failure) ([place]) 07/05/2013   Lower extremity edema 07/05/2013   Hyperlipidemia    Chest pain 07/04/2013   Hypokalemia  09/04/2012   Precordial pain 09/04/2012   Viral meningitis 01/22/2011   PNEUMONIA, LEFT LOWER LOBE 10/10/2006   DISEASE, ACUTE BRONCHOSPASM 10/10/2006   Essential hypertension 05/23/2006   OSTEOARTHRITIS 05/23/2006    Past Surgical History:  Procedure Laterality Date   ABDOMINAL HYSTERECTOMY     BREAST LUMPECTOMY WITH RADIOFREQUENCY TAG IDENTIFICATION Right 01/25/2021   Procedure: BREAST LUMPECTOMY WITH RADIOFREQUENCY TAG IDENTIFICATION;  Surgeon: Virl Cagey, MD;  Location: [facility];  Service: General;  Laterality: Right;   EXCISION OF BREAST BIOPSY Right 01/25/2021   Procedure: EXCISION OF BREAST BIOPSY;  Surgeon: Virl Cagey, MD;  Location: [facility];  Service: General;  Laterality: Right;   IR FLUORO GUIDED NEEDLE PLC ASPIRATION/INJECTION LOC  06/19/2019   LEFT HEART CATHETERIZATION WITH CORONARY ANGIOGRAM N/A 09/05/2012   Procedure: LEFT HEART CATHETERIZATION WITH CORONARY ANGIOGRAM;  Surgeon: Wellington Hampshire, MD;  Location: [facility] [facility];  Service: Cardiovascular;  Laterality: N/A;   RESECTION DISTAL CLAVICAL Left 07/03/2020   Procedure: RESECTION DISTAL CLAVICAL;  Surgeon: Carole Civil, MD;  Location: [facility];  Service: Orthopedics;  Laterality: Left;   SHOULDER OPEN ROTATOR CUFF REPAIR Left 07/03/2020   Procedure: ROTATOR CUFF REPAIR SHOULDER OPEN WITH CHROMEOPLASTY;  Surgeon: Carole Civil, MD;  Location: [facility];  Service: Orthopedics;  Laterality: Left;   SHOULDER OPEN ROTATOR CUFF REPAIR Left 07/02/2021   Procedure: ROTATOR CUFF REPAIR SHOULDER OPEN with Patch Graft;  Surgeon: Carole Civil, MD;  Location: [facility];  Service: Orthopedics;  Laterality: Left;   THYROID SURGERY  2002    OB History     Gravida      Para      Term      Preterm      AB      Living  0      SAB      IAB      Ectopic      Multiple      Live Births               Home Medications    Prior to Admission medications   Medication Sig Start Date End Date  Taking? Authorizing Provider  magnesium oxide (MAG-OX) 400 (240 Mg) MG tablet Take 1 tablet (400 mg total) by mouth 2 (two) times daily for 5 days, THEN 1 tablet (400 mg total) daily. 03/01/22 03/06/23  Arnoldo Lenis, MD  oseltamivir (TAMIFLU) 75 MG capsule Take 1 capsule (75 mg total) by mouth every 12 (twelve) hours. 05/05/22  Yes Volney American, PA-C  promethazine-dextromethorphan (PROMETHAZINE-DM) 6.25-15 MG/5ML syrup Take 5 mLs by mouth 4 (four) times daily as needed. 05/05/22  Yes Volney American, PA-C  albuterol (PROVENTIL) (2.5 MG/3ML) 0.083% nebulizer solution Take 2.5 mg by nebulization every 6 (six) hours as needed for wheezing or shortness of breath.    [provider]  albuterol (VENTOLIN HFA) 108 (90 Base) MCG/ACT inhaler Inhale 1-2 puffs into the lungs every 4 (four) hours as needed for wheezing or shortness of breath. 11/13/19   Varney Biles, MD  Apoaequorin (PREVAGEN) 10 MG CAPS Take 10 mg by mouth daily. Patient not taking: Reported on 02/17/2022    [provider]  benzonatate (TESSALON) 200 MG capsule Take 200 mg by mouth 3 (three) times daily. 08/11/21   [provider]  cetirizine (ZYRTEC) 10 MG tablet Take 10 mg by mouth daily. 05/20/21   [provider]  chlorthalidone (HYGROTON) 50 MG tablet Take 1 tablet (50 mg total) by mouth daily. 02/17/22   Arnoldo Lenis, MD  diltiazem (CARDIZEM) 60 MG tablet Take 1 tablet (60 mg total) by mouth 2 (two) times daily. (MAY TAKE AN ADDITIONAL TAB AS NEEDED FOR PALPITATIONS) 02/17/22   Arnoldo Lenis, MD  furosemide (LASIX) 20 MG tablet Take 1 tablet daily as needed may take 2 tablets prn 02/17/22   Arnoldo Lenis, MD  glipiZIDE-metformin (METAGLIP) 5-500 MG tablet Take 1 tablet by mouth in the morning and at bedtime. Patient not taking: Reported on 02/17/2022 09/27/19   [provider]  HYDROcodone-acetaminophen (NORCO) 10-325 MG tablet Take 1 tablet by mouth every 6 (six)  hours as needed for moderate pain. 01/20/22   Carole Civil, MD  ibuprofen (ADVIL) 800 MG tablet Take 1 tablet (800 mg total) by mouth every 8 (eight) hours as needed. 01/04/21   Carole Civil, MD  Insulin Glargine [facility]) 100 UNIT/ML Inject 15 Units into the skin at bedtime as needed (blood sugar over 150). 10/04/19   [provider]  isosorbide mononitrate (IMDUR) 30 MG 24 hr tablet Take 1 tablet (30 mg total) by mouth daily. 02/17/22   Arnoldo Lenis, MD  lidocaine (LIDODERM) 5 % Place 2 patches onto the skin daily. Remove & Discard patch within 12 hours or as directed by MD Patient taking differently: Place 2 patches onto the skin daily as needed (pain). Remove & Discard patch  within 12 hours or as directed by MD 04/23/20   Raulkar, Clide Deutscher, MD  losartan (COZAAR) 100 MG tablet Take 1 tablet (100 mg total) by mouth daily. 02/17/22   Arnoldo Lenis, MD  metFORMIN (GLUCOPHAGE) 500 MG tablet Take 500 mg by mouth 2 (two) times daily. 12/10/21   [provider]  metoprolol succinate (TOPROL-XL) 50 MG 24 hr tablet Take 1 tablet (50 mg total) by mouth daily. 02/17/22   Arnoldo Lenis, MD  Multiple Vitamin (MULTIVITAMIN WITH MINERALS) TABS tablet Take 1 tablet by mouth daily.    [provider]  nitroGLYCERIN (NITROSTAT) 0.4 MG SL tablet Place 0.4 mg under the tongue every 5 (five) minutes as needed for chest pain. 04/10/21   [provider]  oxyCODONE (ROXICODONE) 5 MG immediate release tablet Take 1 tablet (5 mg total) by mouth daily as needed for severe pain or breakthrough pain. 04/12/22 04/12/23  Derek Jack, MD  potassium chloride SA (KLOR-CON M) 20 MEQ tablet Take 1 tablet (20 mEq total) by mouth daily. 03/08/22   Arnoldo Lenis, MD  RELION PEN NEEDLES 32G X 4 MM MISC  10/14/20   [provider]  rosuvastatin (CRESTOR) 10 MG tablet Take 1 tablet (10 mg total) by mouth at bedtime. 02/17/22   Arnoldo Lenis, MD   TURMERIC PO Take 600 mg by mouth daily.    [provider]    Family History Family History  Problem Relation Age of Onset   Heart attack Mother 12   Heart attack Sister 66   Breast cancer Maternal Grandmother    Breast cancer Cousin     Social History Social History   Tobacco Use   Smoking status: Never    Passive exposure: Never   Smokeless tobacco: Never  Vaping Use   Vaping Use: Never used  Substance Use Topics   Alcohol use: No    Alcohol/week: 0.0 standard drinks of alcohol   Drug use: No     Allergies   Other   Review of Systems Review of Systems Per HPI  Physical Exam Triage Vital Signs ED Triage Vitals  Enc Vitals Group     BP 05/05/22 1700 136/76     Pulse Rate 05/05/22 1700 94     Resp 05/05/22 1700 20     Temp 05/05/22 1700 (!) 102.7 F (39.3 C)     Temp Source 05/05/22 1700 Oral     SpO2 05/05/22 1700 99 %     Weight --      Height --      Head Circumference --      Peak Flow --      Pain Score 05/05/22 1707 10     Pain Loc --      Pain Edu? --      Excl. in [place]? --    No data found.  Updated Vital Signs BP 136/76 (BP Location: Right Arm)   Pulse 94   Temp (!) 102.7 F (39.3 C) (Oral)   Resp 20   SpO2 99%   Visual Acuity Right Eye Distance:   Left Eye Distance:   Bilateral Distance:    Right Eye Near:   Left Eye Near:    Bilateral Near:     Physical Exam Vitals and nursing note reviewed.  Constitutional:      Appearance: Normal appearance.  HENT:     Head: Atraumatic.     Right Ear: Tympanic membrane and external ear normal.  Left Ear: Tympanic membrane and external ear normal.     Nose: Rhinorrhea present.     Mouth/Throat:     Mouth: Mucous membranes are moist.     Pharynx: No posterior oropharyngeal erythema.  Eyes:     Extraocular Movements: Extraocular movements intact.     Conjunctiva/sclera: Conjunctivae normal.  Cardiovascular:     Rate and Rhythm: Normal rate and regular rhythm.     Heart  sounds: Normal heart sounds.  Pulmonary:     Effort: Pulmonary effort is normal.     Breath sounds: Normal breath sounds. No wheezing or rales.  Musculoskeletal:        General: Normal range of motion.     Cervical back: Normal range of motion and neck supple.  Skin:    General: Skin is warm and dry.  Neurological:     Mental Status: She is alert and oriented to person, place, and time.  Psychiatric:        Mood and Affect: Mood normal.        Thought Content: Thought content normal.      UC Treatments / Results  Labs (all labs ordered are listed, but only abnormal results are displayed) Labs Reviewed  SARS CORONAVIRUS 2 (TAT 6-24 HRS)    EKG   Radiology No results found.  Procedures Procedures (including critical care time)  Medications Ordered in UC Medications  acetaminophen (TYLENOL) tablet 1,000 mg (1,000 mg Oral Given 05/05/22 1710)    Initial Impression / Assessment and Plan / UC Course  I have reviewed the triage vital signs and the nursing notes.  Pertinent labs & imaging results that were available during my care of the patient were reviewed by me and considered in my medical decision making (see chart for details).     Febrile in triage, Tylenol given at this time.  High suspicion for influenza so Tamiflu started as well as Phenergan DM for symptomatic benefit.  Discussed supportive over-the-counter medications and home care and COVID testing pending for further rule out.  Return for worsening symptoms.  Final Clinical Impressions(s) / UC Diagnoses   Final diagnoses:  Viral URI with cough  Influenza-like illness   Discharge Instructions   None    ED Prescriptions     Medication Sig Dispense Auth. Provider   oseltamivir (TAMIFLU) 75 MG capsule Take 1 capsule (75 mg total) by mouth every 12 (twelve) hours. 10 capsule Volney American, [place]   promethazine-dextromethorphan (PROMETHAZINE-DM) 6.25-15 MG/5ML syrup Take 5 mLs by mouth 4 (four)  times daily as needed. 100 mL Volney American, [place]      PDMP not reviewed this encounter.   Volney American, [place] 05/05/22 1725

## 2022-05-06 LAB — SARS CORONAVIRUS 2 (TAT 6-24 HRS): SARS Coronavirus 2: NEGATIVE

## 2022-05-16 ENCOUNTER — Ambulatory Visit
Admission: EM | Admit: 2022-05-16 | Discharge: 2022-05-16 | Disposition: A | Discharge status: Home / Self Care | Payer: Managed Care, Other (non HMO) | Attending: Nurse Practitioner | Admitting: Nurse Practitioner

## 2022-05-16 ENCOUNTER — Inpatient Hospital Stay: Payer: No Typology Code available for payment source

## 2022-05-16 DIAGNOSIS — L509 Urticaria, unspecified: Secondary | ICD-10-CM | POA: Diagnosis not present

## 2022-05-16 MED ORDER — CETIRIZINE HCL 10 MG PO TABS: 10.0000 mg | ORAL_TABLET | Freq: Every day | ORAL | 0 refills | Status: DC

## 2022-05-16 MED ORDER — TRIAMCINOLONE ACETONIDE 0.1 % EX OINT: 1.0000 | TOPICAL_OINTMENT | Freq: Two times a day (BID) | CUTANEOUS | 0 refills | Status: DC

## 2022-05-16 MED ORDER — FAMOTIDINE 20 MG PO TABS: 20.0000 mg | ORAL_TABLET | Freq: Two times a day (BID) | ORAL | 0 refills | Status: DC

## 2022-05-16 NOTE — ED Triage Notes (Signed)
Pt reports itching rash all over the body x 1 day. Benadryl gives some relief.

## 2022-05-16 NOTE — Discharge Instructions (Addendum)
As we discussed I suspect the rash is an allergic reaction to the over-the-counter medication you started on Friday.  Please start taking the cetirizine daily in the morning -this will help with itching and the rash.  You can continue Benadryl at nighttime as needed to help with itching.  Please also start Pepcid twice daily to help with the allergic response inside her body.  You can use the topical ointment I sent to the pharmacy twice daily as needed for itching.  Seek care in emergency room if you develop throat or tongue swelling or new shortness of breath.

## 2022-05-16 NOTE — ED Provider Notes (Signed)
[facility]    CSN: [id number] Arrival date & time: 05/16/22  8338      History   Chief Complaint Chief Complaint  Patient presents with   Rash    HPI Hannah Cooper is a 67 y.o. female.   Patient presents today for 3-day history of rash.  Reports the rash is red, itchy, and raised.  Reports when she takes Benadryl, it helps, but then it comes back worse when it wears off.  Reports she started a new medication that she ordered online for her sciatic nerve pain and the rash started Saturday morning.  No throat or tongue swelling, lip swelling, or shortness of breath.  No recent change in detergents, soaps, personal care products.  Reports she has a history of sensitive skin and is very careful what products she uses.    Past Medical History:  Diagnosis Date   Arthritis    Atypical chest pain    chronic   Cancer (HCC)    bone cancer   Chronic back pain    Chronic diastolic CHF (congestive heart failure) (HCC)    COPD (chronic obstructive pulmonary disease) (HCC)    DDD (degenerative disc disease), cervical    Diabetes mellitus without complication ([place])    History of radiation therapy 07/16/19-08/22/19   L4 spine ; Dr. 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   Plasma cell disorder 06/10/2019   Lesion of bone of lumbosacral spine 05/28/2019   Left-sided weakness 03/13/2016   Cerebrovascular accident (CVA) ([place])    Palpitations    COPD (chronic  obstructive pulmonary disease) ([place]) 08/20/2014   Dyspnea 12/19/2013   Chronic diastolic CHF (congestive heart failure) ([place]) 07/05/2013   Lower extremity edema 07/05/2013   Hyperlipidemia    Chest pain 07/04/2013   Hypokalemia 09/04/2012   Precordial pain 09/04/2012   Viral meningitis 01/22/2011   PNEUMONIA, LEFT LOWER LOBE 10/10/2006   DISEASE, ACUTE BRONCHOSPASM 10/10/2006   Essential hypertension 05/23/2006   OSTEOARTHRITIS 05/23/2006    Past Surgical History:  Procedure Laterality Date   ABDOMINAL HYSTERECTOMY     BREAST LUMPECTOMY WITH RADIOFREQUENCY TAG IDENTIFICATION Right 01/25/2021   Procedure: BREAST LUMPECTOMY WITH RADIOFREQUENCY TAG IDENTIFICATION;  Surgeon: Virl Cagey, MD;  Location: [facility];  Service: General;  Laterality: Right;   EXCISION OF BREAST BIOPSY Right 01/25/2021   Procedure: EXCISION OF BREAST BIOPSY;  Surgeon: Virl Cagey, MD;  Location: [facility];  Service: General;  Laterality: Right;   IR FLUORO GUIDED NEEDLE PLC ASPIRATION/INJECTION LOC  06/19/2019   LEFT HEART CATHETERIZATION WITH CORONARY ANGIOGRAM N/A 09/05/2012   Procedure: LEFT HEART CATHETERIZATION WITH CORONARY ANGIOGRAM;  Surgeon: Wellington Hampshire, MD;  Location: [facility];  Service: Cardiovascular;  Laterality: N/A;   RESECTION DISTAL CLAVICAL Left 07/03/2020   Procedure: RESECTION DISTAL CLAVICAL;  Surgeon: Carole Civil, MD;  Location: [facility];  Service: Orthopedics;  Laterality: Left;   SHOULDER OPEN  ROTATOR CUFF REPAIR Left 07/03/2020   Procedure: ROTATOR CUFF REPAIR SHOULDER OPEN WITH CHROMEOPLASTY;  Surgeon: Carole Civil, MD;  Location: [facility];  Service: Orthopedics;  Laterality: Left;   SHOULDER OPEN ROTATOR CUFF REPAIR Left 07/02/2021   Procedure: ROTATOR CUFF REPAIR SHOULDER OPEN with Patch Graft;  Surgeon: Carole Civil, MD;  Location: [facility];  Service: Orthopedics;  Laterality: Left;   THYROID SURGERY  2002    OB History     Gravida      Para       Term      Preterm      AB      Living  0      SAB      IAB      Ectopic      Multiple      Live Births               Home Medications    Prior to Admission medications   Medication Sig Start Date End Date Taking? Authorizing Provider  cetirizine (ZYRTEC) 10 MG tablet Take 1 tablet (10 mg total) by mouth daily. 05/16/22  Yes Eulogio Bear, NP  famotidine (PEPCID) 20 MG tablet Take 1 tablet (20 mg total) by mouth 2 (two) times daily for 7 days. 05/16/22 05/23/22 Yes Eulogio Bear, NP  magnesium oxide (MAG-OX) 400 (240 Mg) MG tablet Take 1 tablet (400 mg total) by mouth 2 (two) times daily for 5 days, THEN 1 tablet (400 mg total) daily. 03/01/22 03/06/23  Arnoldo Lenis, MD  triamcinolone ointment (KENALOG) 0.1 % Apply 1 Application topically 2 (two) times daily. Apply sparingly twice daily to clean skin as needed 05/16/22  Yes Noemi Chapel A, NP  albuterol (PROVENTIL) (2.5 MG/3ML) 0.083% nebulizer solution Take 2.5 mg by nebulization every 6 (six) hours as needed for wheezing or shortness of breath.    [provider]  albuterol (VENTOLIN HFA) 108 (90 Base) MCG/ACT inhaler Inhale 1-2 puffs into the lungs every 4 (four) hours as needed for wheezing or shortness of breath. 11/13/19   Varney Biles, MD  Apoaequorin (PREVAGEN) 10 MG CAPS Take 10 mg by mouth daily. Patient not taking: Reported on 02/17/2022    [provider]  chlorthalidone (HYGROTON) 50 MG tablet Take 1 tablet (50 mg total) by mouth daily. 02/17/22   Arnoldo Lenis, MD  diltiazem (CARDIZEM) 60 MG tablet Take 1 tablet (60 mg total) by mouth 2 (two) times daily. (MAY TAKE AN ADDITIONAL TAB AS NEEDED FOR PALPITATIONS) 02/17/22   Arnoldo Lenis, MD  furosemide (LASIX) 20 MG tablet Take 1 tablet daily as needed may take 2 tablets prn 02/17/22   Arnoldo Lenis, MD  glipiZIDE-metformin (METAGLIP) 5-500 MG tablet Take 1 tablet by mouth in the morning and at bedtime. Patient not  taking: Reported on 02/17/2022 09/27/19   [provider]  HYDROcodone-acetaminophen (NORCO) 10-325 MG tablet Take 1 tablet by mouth every 6 (six) hours as needed for moderate pain. 01/20/22   Carole Civil, MD  ibuprofen (ADVIL) 800 MG tablet Take 1 tablet (800 mg total) by mouth every 8 (eight) hours as needed. 01/04/21   Carole Civil, MD  Insulin Glargine [facility]) 100 UNIT/ML Inject 15 Units into the skin at bedtime as needed (blood sugar over 150). 10/04/19   [provider]  isosorbide mononitrate (IMDUR) 30 MG 24 hr tablet Take 1 tablet (30 mg total) by mouth daily. 02/17/22   Branch,  Alphonse Guild, MD  lidocaine (LIDODERM) 5 % Place 2 patches onto the skin daily. Remove & Discard patch within 12 hours or as directed by MD Patient taking differently: Place 2 patches onto the skin daily as needed (pain). Remove & Discard patch within 12 hours or as directed by MD 04/23/20   Raulkar, Clide Deutscher, MD  losartan (COZAAR) 100 MG tablet Take 1 tablet (100 mg total) by mouth daily. 02/17/22   Arnoldo Lenis, MD  metFORMIN (GLUCOPHAGE) 500 MG tablet Take 500 mg by mouth 2 (two) times daily. 12/10/21   [provider]  metoprolol succinate (TOPROL-XL) 50 MG 24 hr tablet Take 1 tablet (50 mg total) by mouth daily. 02/17/22   Arnoldo Lenis, MD  Multiple Vitamin (MULTIVITAMIN WITH MINERALS) TABS tablet Take 1 tablet by mouth daily.    [provider]  nitroGLYCERIN (NITROSTAT) 0.4 MG SL tablet Place 0.4 mg under the tongue every 5 (five) minutes as needed for chest pain. 04/10/21   [provider]  oxyCODONE (ROXICODONE) 5 MG immediate release tablet Take 1 tablet (5 mg total) by mouth daily as needed for severe pain or breakthrough pain. 04/12/22 04/12/23  Derek Jack, MD  potassium chloride SA (KLOR-CON M) 20 MEQ tablet Take 1 tablet (20 mEq total) by mouth daily. 03/08/22   Arnoldo Lenis, MD  RELION PEN NEEDLES 32G X 4 MM MISC   10/14/20   [provider]  rosuvastatin (CRESTOR) 10 MG tablet Take 1 tablet (10 mg total) by mouth at bedtime. 02/17/22   Arnoldo Lenis, MD  TURMERIC PO Take 600 mg by mouth daily.    [provider]    Family History Family History  Problem Relation Age of Onset   Heart attack Mother 47   Heart attack Sister 11   Breast cancer Maternal Grandmother    Breast cancer Cousin     Social History Social History   Tobacco Use   Smoking status: Never    Passive exposure: Never   Smokeless tobacco: Never  Vaping Use   Vaping Use: Never used  Substance Use Topics   Alcohol use: No    Alcohol/week: 0.0 standard drinks of alcohol   Drug use: No     Allergies   Other   Review of Systems Review of Systems Per HPI  Physical Exam Triage Vital Signs ED Triage Vitals  Enc Vitals Group     BP 05/16/22 1150 (!) 146/76     Pulse Rate 05/16/22 1150 (!) 59     Resp 05/16/22 1150 18     Temp 05/16/22 1150 98 F (36.7 C)     Temp Source 05/16/22 1150 Oral     SpO2 05/16/22 1150 99 %     Weight --      Height --      Head Circumference --      Peak Flow --      Pain Score 05/16/22 1156 0     Pain Loc --      Pain Edu? --      Excl. in [place]? --    No data found.  Updated Vital Signs BP (!) 146/76 (BP Location: Right Arm)   Pulse (!) 59   Temp 98 F (36.7 C) (Oral)   Resp 18   SpO2 99%   Visual Acuity Right Eye Distance:   Left Eye Distance:   Bilateral Distance:    Right Eye Near:   Left Eye Near:  Bilateral Near:     Physical Exam Vitals and nursing note reviewed.  Constitutional:      General: She is not in acute distress.    Appearance: Normal appearance. She is not ill-appearing or toxic-appearing.  HENT:     Mouth/Throat:     Mouth: Mucous membranes are moist.     Pharynx: Oropharynx is clear.  Cardiovascular:     Rate and Rhythm: Normal rate and regular rhythm.  Pulmonary:     Effort: Pulmonary effort is normal. No respiratory  distress.     Breath sounds: Normal breath sounds. No wheezing, rhonchi or rales.  Skin:    General: Skin is warm and dry.     Capillary Refill: Capillary refill takes less than 2 seconds.     Findings: Erythema and rash present. Rash is urticarial.     Comments: Erythematous, urticarial rash to bilateral inner thighs, bilateral forearms.  No warmth, surrounding fluctuance, drainage.  Neurological:     Mental Status: She is alert and oriented to person, place, and time.  Psychiatric:        Behavior: Behavior is cooperative.      UC Treatments / Results  Labs (all labs ordered are listed, but only abnormal results are displayed) Labs Reviewed - No data to display  EKG   Radiology No results found.  Procedures Procedures (including critical care time)  Medications Ordered in UC Medications - No data to display  Initial Impression / Assessment and Plan / UC Course  I have reviewed the triage vital signs and the nursing notes.  Pertinent labs & imaging results that were available during my care of the patient were reviewed by me and considered in my medical decision making (see chart for details).   Patient is well-appearing, normotensive, afebrile, not tachycardic, not tachypneic, oxygenating well on room air.    1. Localized hives Treat with twice daily oral antihistamine as needed for itching Also recommended use of Pepcid 20 mg twice daily Stop online OTC supplement  Can use topical triamcinolone ointment to help with itching Seek care if symptoms persist or worsen despite treatment Strict ER precautions discussed with patient  The patient was given the opportunity to ask questions.  All questions answered to their satisfaction.  The patient is in agreement to this plan.    Final Clinical Impressions(s) / UC Diagnoses   Final diagnoses:  Localized hives     Discharge Instructions      As we discussed I suspect the rash is an allergic reaction to the  over-the-counter medication you started on Friday.  Please start taking the cetirizine daily in the morning -this will help with itching and the rash.  You can continue Benadryl at nighttime as needed to help with itching.  Please also start Pepcid twice daily to help with the allergic response inside her body.  You can use the topical ointment I sent to the pharmacy twice daily as needed for itching.  Seek care in emergency room if you develop throat or tongue swelling or new shortness of breath.     ED Prescriptions     Medication Sig Dispense Auth. Provider   cetirizine (ZYRTEC) 10 MG tablet Take 1 tablet (10 mg total) by mouth daily. 30 tablet Noemi Chapel A, NP   famotidine (PEPCID) 20 MG tablet Take 1 tablet (20 mg total) by mouth 2 (two) times daily for 7 days. 14 tablet Noemi Chapel A, NP   triamcinolone ointment (KENALOG) 0.1 % Apply 1  Application topically 2 (two) times daily. Apply sparingly twice daily to clean skin as needed 30 g Eulogio Bear, NP      PDMP not reviewed this encounter.   Eulogio Bear, NP 05/16/22 1230

## 2022-05-23 ENCOUNTER — Ambulatory Visit: Payer: PPO | Admitting: Hematology

## 2022-06-13 ENCOUNTER — Inpatient Hospital Stay: Payer: Managed Care, Other (non HMO) | Attending: Hematology | Admitting: Hematology

## 2022-06-13 ENCOUNTER — Inpatient Hospital Stay: Payer: Managed Care, Other (non HMO)

## 2022-06-13 DIAGNOSIS — C9030 Solitary plasmacytoma not having achieved remission: Secondary | ICD-10-CM | POA: Diagnosis not present

## 2022-06-13 DIAGNOSIS — D472 Monoclonal gammopathy: Secondary | ICD-10-CM

## 2022-06-13 LAB — COMPREHENSIVE METABOLIC PANEL
ALT: 41 U/L (ref 0–44)
AST: 34 U/L (ref 15–41)
Albumin: 4 g/dL (ref 3.5–5.0)
Alkaline Phosphatase: 61 U/L (ref 38–126)
Anion gap: 12 (ref 5–15)
BUN: 21 mg/dL (ref 8–23)
CO2: 24 mmol/L (ref 22–32)
Calcium: 9.1 mg/dL (ref 8.9–10.3)
Chloride: 101 mmol/L (ref 98–111)
Creatinine, Ser: 0.99 mg/dL (ref 0.44–1.00)
GFR, Estimated: >60 mL/min (ref >60)
Glucose, Bld: 206 mg/dL — ABNORMAL HIGH (ref 70–99)
Potassium: 2.8 mmol/L — ABNORMAL LOW (ref 3.5–5.1)
Sodium: 137 mmol/L (ref 135–145)
Total Bilirubin: 0.7 mg/dL (ref 0.3–1.2)
Total Protein: 7.8 g/dL (ref 6.5–8.1)

## 2022-06-13 LAB — CBC WITH DIFFERENTIAL/PLATELET
Abs Immature Granulocytes: 0.01 10*3/uL (ref 0.00–0.07)
Basophils Absolute: 0 10*3/uL (ref 0.0–0.1)
Basophils Relative: 0 %
Eosinophils Absolute: 0.2 10*3/uL (ref 0.0–0.5)
Eosinophils Relative: 4 %
HCT: 35.1 % — ABNORMAL LOW (ref 36.0–46.0)
Hemoglobin: 11.7 g/dL — ABNORMAL LOW (ref 12.0–15.0)
Immature Granulocytes: 0 %
Lymphocytes Relative: 37 %
Lymphs Abs: 1.5 10*3/uL (ref 0.7–4.0)
MCH: 29.6 pg (ref 26.0–34.0)
MCHC: 33.3 g/dL (ref 30.0–36.0)
MCV: 88.9 fL (ref 80.0–100.0)
Monocytes Absolute: 0.4 10*3/uL (ref 0.1–1.0)
Monocytes Relative: 10 %
Neutro Abs: 1.9 10*3/uL (ref 1.7–7.7)
Neutrophils Relative %: 49 %
Platelets: 308 10*3/uL (ref 150–400)
RBC: 3.95 MIL/uL (ref 3.87–5.11)
RDW: 13.3 % (ref 11.5–15.5)
WBC: 3.9 10*3/uL — ABNORMAL LOW (ref 4.0–10.5)
nRBC: 0 % (ref 0.0–0.2)

## 2022-06-13 NOTE — Progress Notes (Signed)
Per Dr. Delton Coombes, patient advised to take (3) 20 meq K+ today (2) tomorrow and resume usual daily dose of 20 meq thereafter.  Verbalized understanding.  She voiced concerns about her blood sugars and was advised to contact PCP regarding abnormal values, as medication may need to be adjusted.

## 2022-06-14 LAB — KAPPA/LAMBDA LIGHT CHAINS
Kappa free light chain: 40.1 mg/L — ABNORMAL HIGH (ref 3.3–19.4)
Kappa, lambda light chain ratio: 1.64 (ref 0.26–1.65)
Lambda free light chains: 24.5 mg/L (ref 5.7–26.3)

## 2022-06-16 ENCOUNTER — Encounter: Payer: Self-pay | Admitting: Radiology

## 2022-06-16 LAB — PROTEIN ELECTROPHORESIS, SERUM
A/G Ratio: 1.2 (ref 0.7–1.7)
Albumin ELP: 4.1 g/dL (ref 2.9–4.4)
Alpha-1-Globulin: 0.2 g/dL (ref 0.0–0.4)
Alpha-2-Globulin: 0.6 g/dL (ref 0.4–1.0)
Beta Globulin: 1 g/dL (ref 0.7–1.3)
Gamma Globulin: 1.6 g/dL (ref 0.4–1.8)
Globulin, Total: 3.5 g/dL (ref 2.2–3.9)
Total Protein ELP: 7.6 g/dL (ref 6.0–8.5)

## 2022-06-19 NOTE — Progress Notes (Signed)
[facility] [address], [place] [address]    Clinic Day:  06/20/2022  Referring physician: Jake Samples, PA*  Patient Care Team: Scherrie Bateman as PCP - General (Family Medicine) Harl Bowie Alphonse Guild, MD as PCP - Cardiology (Cardiology) Lendon Colonel, NP as Nurse Practitioner (Nurse Practitioner) Donetta Potts, RN as Oncology Nurse Navigator (Oncology) Derek Jack, MD as Medical Oncologist (Oncology)   ASSESSMENT & PLAN:   Assessment: 1.  Plasmacytoma of L4 vertebral body: -MRI lumbar spine shows L4 lesion. -PET scan on 06/10/2019 shows mildly increased FDG uptake associated with mixed lytic/sclerotic lesion involving L4 vertebral body SUV 4.4. -Serum immunofixation shows IgG kappa monoclonal protein.  SPEP-no M spike.  Kappa light chains elevated at 25.3.  Ratio 1.12.  Lambda light chains 21.9.  Beta-2 microglobulin 2.2. -24-hour urine was negative for UPEP and immunofixation.  Protein was 130 mg. -L4 needle biopsy on 06/19/2019 showed minute segments of the bone and soft tissue, limited cellularity.  IHC for CD138 highlights scattering plasma cells.  Cytokeratin is negative.  Few kappa positive plasma cells present.  Overall material is very limited and essentially nondiagnostic. -Clinically this is consistent with plasmacytoma.  We reviewed bone marrow biopsy results which showed normocellular marrow.  Cytogenetics are normal. -XRT to L4 vertebral body from 07/16/2019 through 08/22/2019. -MRI of the lumbar spine on 12/03/2019 showed unchanged size of the L4 vertebral body lesion, slightly decreased contrast-enhancement with no new lesions.   2.  Low back pain: -She is taking half tablet of hydrocodone 10/325 every 6 hours as needed which is helping. -Likely this will improve upon completion of radiation.  3.  Right breast intraductal papilloma: - Biopsy on 12/01/2020 at 3 o'clock position of the right breast with intraductal  papilloma with florid usual ductal hyperplasia and sclerosing fibrosis. - Right breast lumpectomy on 01/25/2021 with intraductal papilloma with florid UDH and calcifications 1.2 cm.  Margins negative.  Plan: 1.  Plasmacytoma of L4 vertebral body: - She does not have any new onset pains. - Reviewed labs from 06/13/2022: M spike is not observed.  Kappa light chains are elevated and stable.  Immunofixation shows polyclonal increase.  Free light chain ratio is normal at 1.64.  Hemoglobin is 11.7 and normal calcium and creatinine. - She does not have any "crab" features. - RTC 6 months for follow-up with repeat labs and skeletal survey.   2.  Low back pain: - She is taking oxycodone 5 mg daily as needed.  Sometimes she needs twice daily.  I will increase the prescription number to 45 tablets.   3.  Severe hypokalemia: - Continue potassium supplements as directed.  Last potassium was 2.8.  She has taken additional supplements.  4.  Right breast intraductal papilloma and florid UDH: - Continue yearly mammograms.  Orders Placed This Encounter  Procedures   DG Bone Survey Met    Standing Status:   Future    Standing Expiration Date:   06/20/2023    Order Specific Question:   Reason for Exam (SYMPTOM  OR DIAGNOSIS REQUIRED)    Answer:   plasmocytoma, MGUS    Order Specific Question:   Preferred imaging location?    Answer:   [facility]    Order Specific Question:   Release to patient    Answer:   Immediate   CBC with Differential    Standing Status:   Future    Standing Expiration Date:   06/20/2023  Comprehensive metabolic panel    Standing Status:   Future    Standing Expiration Date:   06/20/2023   Protein electrophoresis, serum    Standing Status:   Future    Standing Expiration Date:   06/20/2023   Kappa/lambda light chains    Standing Status:   Future    Standing Expiration Date:   06/20/2023   Immunofixation electrophoresis    Standing Status:   Future    Standing Expiration  Date:   06/20/2023      I,Alexis Herring,acting as a scribe for Derek Jack, MD.,have documented all relevant documentation on the behalf of Derek Jack, MD,as directed by  Derek Jack, MD while in the presence of Derek Jack, MD.   I, Derek Jack MD, have reviewed the above documentation for accuracy and completeness, and I agree with the above.   Derek Jack, MD   3/4/20247:19 PM  CHIEF COMPLAINT:   Diagnosis: L4 plasmacytoma   Cancer Staging  No matching staging information was found for the patient.   Prior Therapy: XRT in 27 fractions from 07/17/2019 to 08/22/2019   Current Therapy:  surveillance   HISTORY OF PRESENT ILLNESS:   Oncology History   No history exists.     INTERVAL HISTORY:   Hannah Cooper is a 67 y.o. female presenting to clinic today for follow up of L4 plasmacytoma. She was last seen by me on 06/13/22.  Today, she states that she is doing well overall. Her appetite level is at 100%. Her energy level is at 25%.  She reports back pain is stable.  Occasionally she requires 2 tablets of oxycodone daily.  No new onset pains.   PAST MEDICAL HISTORY:   Past Medical History: Past Medical History:  Diagnosis Date   Arthritis    Atypical chest pain    chronic   Cancer (HCC)    bone cancer   Chronic back pain    Chronic diastolic CHF (congestive heart failure) (HCC)    COPD (chronic obstructive pulmonary disease) (HCC)    DDD (degenerative disc disease), cervical    Diabetes mellitus without complication ([place])    History of radiation therapy 07/16/19-08/22/19   L4 spine ; Dr. Gery Pray   Hyperlipidemia    Hypertension    Lumbar radiculopathy    Meningitis    Normal coronary arteries 2014   Palpitations    Premature atrial contractions    PVC's (premature ventricular contractions)     Surgical History: Past Surgical History:  Procedure Laterality Date   ABDOMINAL HYSTERECTOMY     BREAST LUMPECTOMY  WITH RADIOFREQUENCY TAG IDENTIFICATION Right 01/25/2021   Procedure: BREAST LUMPECTOMY WITH RADIOFREQUENCY TAG IDENTIFICATION;  Surgeon: Virl Cagey, MD;  Location: [facility];  Service: General;  Laterality: Right;   EXCISION OF BREAST BIOPSY Right 01/25/2021   Procedure: EXCISION OF BREAST BIOPSY;  Surgeon: Virl Cagey, MD;  Location: [facility];  Service: General;  Laterality: Right;   IR FLUORO GUIDED NEEDLE PLC ASPIRATION/INJECTION LOC  06/19/2019   LEFT HEART CATHETERIZATION WITH CORONARY ANGIOGRAM N/A 09/05/2012   Procedure: LEFT HEART CATHETERIZATION WITH CORONARY ANGIOGRAM;  Surgeon: Wellington Hampshire, MD;  Location: [facility];  Service: Cardiovascular;  Laterality: N/A;   RESECTION DISTAL CLAVICAL Left 07/03/2020   Procedure: RESECTION DISTAL CLAVICAL;  Surgeon: Carole Civil, MD;  Location: [facility];  Service: Orthopedics;  Laterality: Left;   SHOULDER OPEN ROTATOR CUFF REPAIR Left 07/03/2020   Procedure: ROTATOR CUFF REPAIR SHOULDER OPEN WITH CHROMEOPLASTY;  Surgeon: Carole Civil, MD;  Location: [facility];  Service: Orthopedics;  Laterality: Left;   SHOULDER OPEN ROTATOR CUFF REPAIR Left 07/02/2021   Procedure: ROTATOR CUFF REPAIR SHOULDER OPEN with Patch Graft;  Surgeon: Carole Civil, MD;  Location: [facility];  Service: Orthopedics;  Laterality: Left;   THYROID SURGERY  2002    Social History: Social History   Socioeconomic History   Marital status: Married    Spouse name: Not on file   Number of children: Not on file   Years of education: Not on file   Highest education level: Not on file  Occupational History   Not on file  Tobacco Use   Smoking status: Never    Passive exposure: Never   Smokeless tobacco: Never  Vaping Use   Vaping Use: Never used  Substance and Sexual Activity   Alcohol use: No    Alcohol/week: 0.0 standard drinks of alcohol   Drug use: No   Sexual activity: Yes    Partners: Male  Other Topics Concern   Not on file  Social  History Narrative   Not on file   Social Determinants of Health   Financial Resource Strain: Low Risk  (05/28/2019)   Overall Financial Resource Strain (CARDIA)    Difficulty of Paying Living Expenses: Not hard at all  Food Insecurity: No Food Insecurity (05/28/2019)   Hunger Vital Sign    Worried About Running Out of Food in the Last Year: Never true    [place] in the Last Year: Never true  Transportation Needs: No Transportation Needs (05/28/2019)   PRAPARE - Hydrologist (Medical): No    Lack of Transportation (Non-Medical): No  Physical Activity: Inactive (05/28/2019)   Exercise Vital Sign    Days of Exercise per Week: 0 days    Minutes of Exercise per Session: 0 min  Stress: Stress Concern Present (05/28/2019)   [place]    Feeling of Stress : To some extent  Social Connections: Socially Integrated (05/28/2019)   Social Connection and Isolation Panel [NHANES]    Frequency of Communication with Friends and Family: More than three times a week    Frequency of Social Gatherings with Friends and Family: Never    Attends Religious Services: More than 4 times per year    Active Member of [employer] or Organizations: Yes    Attends Music therapist: More than 4 times per year    Marital Status: Married  Human resources officer Violence: Not At Risk (05/28/2019)   Humiliation, Afraid, Rape, and Kick questionnaire    Fear of Current or Ex-Partner: No    Emotionally Abused: No    Physically Abused: No    Sexually Abused: No    Family History: Family History  Problem Relation Age of Onset   Heart attack Mother 41   Heart attack Sister 48   Breast cancer Maternal Grandmother    Breast cancer Cousin     Current Medications:  Current Outpatient Medications:    albuterol (PROVENTIL) (2.5 MG/3ML) 0.083% nebulizer solution, Take 2.5 mg by nebulization every 6 (six) hours as needed for  wheezing or shortness of breath., Disp: , Rfl:    albuterol (VENTOLIN HFA) 108 (90 Base) MCG/ACT inhaler, Inhale 1-2 puffs into the lungs every 4 (four) hours as needed for wheezing or shortness of breath., Disp: 18 g, Rfl: 0   cetirizine (ZYRTEC) 10 MG tablet, Take  1 tablet (10 mg total) by mouth daily., Disp: 30 tablet, Rfl: 0   chlorthalidone (HYGROTON) 50 MG tablet, Take 1 tablet (50 mg total) by mouth daily., Disp: 90 tablet, Rfl: 3   diltiazem (CARDIZEM) 60 MG tablet, Take 1 tablet (60 mg total) by mouth 2 (two) times daily. (MAY TAKE AN ADDITIONAL TAB AS NEEDED FOR PALPITATIONS), Disp: 180 tablet, Rfl: 3   famotidine (PEPCID) 20 MG tablet, Take 1 tablet (20 mg total) by mouth 2 (two) times daily for 7 days., Disp: 14 tablet, Rfl: 0   furosemide (LASIX) 20 MG tablet, Take 1 tablet daily as needed may take 2 tablets prn, Disp: 90 tablet, Rfl: 3   HYDROcodone-acetaminophen (NORCO) 10-325 MG tablet, Take 1 tablet by mouth every 6 (six) hours as needed for moderate pain., Disp: 30 tablet, Rfl: 0   ibuprofen (ADVIL) 800 MG tablet, Take 1 tablet (800 mg total) by mouth every 8 (eight) hours as needed., Disp: 90 tablet, Rfl: 1   Insulin Glargine (BASAGLAR KWIKPEN) 100 UNIT/ML, Inject 15 Units into the skin at bedtime as needed (blood sugar over 150)., Disp: , Rfl:    isosorbide mononitrate (IMDUR) 30 MG 24 hr tablet, Take 1 tablet (30 mg total) by mouth daily., Disp: 90 tablet, Rfl: 3   lidocaine (LIDODERM) 5 %, Place 2 patches onto the skin daily. Remove & Discard patch within 12 hours or as directed by MD (Patient taking differently: Place 2 patches onto the skin daily as needed (pain). Remove & Discard patch within 12 hours or as directed by MD), Disp: 60 patch, Rfl: 1   losartan (COZAAR) 100 MG tablet, Take 1 tablet (100 mg total) by mouth daily., Disp: 90 tablet, Rfl: 3   magnesium oxide (MAG-OX) 400 (240 Mg) MG tablet, Take 1 tablet (400 mg total) by mouth 2 (two) times daily for 5 days, THEN 1  tablet (400 mg total) daily., Disp: 95 tablet, Rfl: 1   metFORMIN (GLUCOPHAGE) 500 MG tablet, Take 500 mg by mouth 2 (two) times daily., Disp: , Rfl:    metoprolol succinate (TOPROL-XL) 50 MG 24 hr tablet, Take 1 tablet (50 mg total) by mouth daily., Disp: 90 tablet, Rfl: 3   Multiple Vitamin (MULTIVITAMIN WITH MINERALS) TABS tablet, Take 1 tablet by mouth daily., Disp: , Rfl:    nitroGLYCERIN (NITROSTAT) 0.4 MG SL tablet, Place 0.4 mg under the tongue every 5 (five) minutes as needed for chest pain., Disp: , Rfl:    potassium chloride SA (KLOR-CON M) 20 MEQ tablet, Take 1 tablet (20 mEq total) by mouth daily., Disp: 90 tablet, Rfl: 3   rosuvastatin (CRESTOR) 10 MG tablet, Take 1 tablet (10 mg total) by mouth at bedtime., Disp: 90 tablet, Rfl: 3   triamcinolone ointment (KENALOG) 0.1 %, Apply 1 Application topically 2 (two) times daily. Apply sparingly twice daily to clean skin as needed, Disp: 30 g, Rfl: 0   TURMERIC PO, Take 600 mg by mouth daily., Disp: , Rfl:    oxyCODONE (ROXICODONE) 5 MG immediate release tablet, Take 1 tablet (5 mg total) by mouth 2 (two) times daily as needed for severe pain or breakthrough pain., Disp: 45 tablet, Rfl: 0   Allergies: Allergies  Allergen Reactions   Other Swelling    [place] lipstick    REVIEW OF SYSTEMS:   Review of Systems  Constitutional:  Negative for chills, fatigue and fever.  HENT:   Negative for lump/mass, mouth sores, nosebleeds, sore throat and trouble swallowing.   Eyes:  Negative for eye problems.  Respiratory:  Negative for cough and shortness of breath.   Cardiovascular:  Negative for chest pain, leg swelling and palpitations.  Gastrointestinal:  Negative for abdominal pain, constipation, diarrhea, nausea and vomiting.  Genitourinary:  Negative for bladder incontinence, difficulty urinating, dysuria, frequency, hematuria and nocturia.   Musculoskeletal:  Negative for arthralgias, back pain, flank pain, myalgias and neck pain.  Skin:   Negative for itching and rash.  Neurological:  Negative for dizziness, headaches and numbness.  Hematological:  Does not bruise/bleed easily.  Psychiatric/Behavioral:  Negative for depression, sleep disturbance and suicidal ideas. The patient is not nervous/anxious.   All other systems reviewed and are negative.    VITALS:   Blood pressure 127/88, pulse 83, temperature 98.3 F (36.8 C), temperature source Oral, resp. rate 18, weight 218 lb (98.9 kg), SpO2 100 %.  Wt Readings from Last 3 Encounters:  06/20/22 218 lb (98.9 kg)  02/17/22 226 lb (102.5 kg)  11/16/21 220 lb 3.8 oz (99.9 kg)    Body mass index is 36.28 kg/m.  Performance status (ECOG): 1 - Symptomatic but completely ambulatory  PHYSICAL EXAM:   Physical Exam Vitals and nursing note reviewed. Exam conducted with a chaperone present.  Constitutional:      Appearance: Normal appearance.  Cardiovascular:     Rate and Rhythm: Normal rate and regular rhythm.     Pulses: Normal pulses.     Heart sounds: Normal heart sounds.  Pulmonary:     Effort: Pulmonary effort is normal.     Breath sounds: Normal breath sounds.  Abdominal:     Palpations: Abdomen is soft. There is no hepatomegaly, splenomegaly or mass.     Tenderness: There is no abdominal tenderness.  Musculoskeletal:     Right lower leg: No edema.     Left lower leg: No edema.  Lymphadenopathy:     Cervical: No cervical adenopathy.     Right cervical: No superficial, deep or posterior cervical adenopathy.    Left cervical: No superficial, deep or posterior cervical adenopathy.     Upper Body:     Right upper body: No supraclavicular or axillary adenopathy.     Left upper body: No supraclavicular or axillary adenopathy.  Neurological:     General: No focal deficit present.     Mental Status: She is alert and oriented to person, place, and time.  Psychiatric:        Mood and Affect: Mood normal.        Behavior: Behavior normal.     LABS:      Latest  Ref Rng & Units 06/13/2022    9:10 AM 11/09/2021    8:12 AM 06/29/2021    9:30 AM  CBC  WBC 4.0 - 10.5 K/uL 3.9  2.9  3.1   Hemoglobin 12.0 - 15.0 g/dL 11.7  11.8  11.9   Hematocrit 36.0 - 46.0 % 35.1  36.5  35.6   Platelets 150 - 400 K/uL 308  279  319       Latest Ref Rng & Units 06/13/2022    9:10 AM 02/17/2022   12:25 PM 11/09/2021    8:12 AM  CMP  Glucose 70 - 99 mg/dL 206  307  105   BUN 8 - 23 mg/dL '21  22  15   '$ Creatinine 0.44 - 1.00 mg/dL 0.99  0.95  0.87   Sodium 135 - 145 mmol/L 137  135  140   Potassium 3.5 - 5.1  mmol/L 2.8  3.2  3.2   Chloride 98 - 111 mmol/L 101  95  103   CO2 22 - 32 mmol/L '24  29  30   '$ Calcium 8.9 - 10.3 mg/dL 9.1  9.4  9.4   Total Protein 6.5 - 8.1 g/dL 7.8   8.1   Total Bilirubin 0.3 - 1.2 mg/dL 0.7   0.4   Alkaline Phos 38 - 126 U/L 61   59   AST 15 - 41 U/L 34   27   ALT 0 - 44 U/L 41   34      No results found for: "CEA1", "CEA" / No results found for: "CEA1", "CEA" No results found for: "PSA1" No results found for: "[id number]" No results found for: "CAN125"  Lab Results  Component Value Date   TOTALPROTELP 7.5 06/13/2022   TOTALPROTELP 7.6 06/13/2022   ALBUMINELP 4.1 06/13/2022   A1GS 0.2 06/13/2022   A2GS 0.6 06/13/2022   BETS 1.0 06/13/2022   GAMS 1.6 06/13/2022   MSPIKE Not Observed 06/13/2022   SPEI Comment 06/13/2022   Lab Results  Component Value Date   FERRITIN 346 (H) 11/13/2019   Lab Results  Component Value Date   LDH 158 11/09/2021   LDH 159 05/03/2021   LDH 144 01/27/2021     STUDIES:   No results found.

## 2022-06-20 ENCOUNTER — Inpatient Hospital Stay: Payer: Managed Care, Other (non HMO) | Attending: Hematology | Admitting: Hematology

## 2022-06-20 ENCOUNTER — Other Ambulatory Visit: Payer: Self-pay

## 2022-06-20 ENCOUNTER — Encounter: Payer: Self-pay | Admitting: Hematology

## 2022-06-20 VITALS — BP 127/88 | HR 83 | Temp 98.3°F | Resp 18 | Wt 218.0 lb

## 2022-06-20 DIAGNOSIS — Z90710 Acquired absence of both cervix and uterus: Secondary | ICD-10-CM | POA: Diagnosis not present

## 2022-06-20 DIAGNOSIS — D241 Benign neoplasm of right breast: Secondary | ICD-10-CM | POA: Insufficient documentation

## 2022-06-20 DIAGNOSIS — E876 Hypokalemia: Secondary | ICD-10-CM | POA: Diagnosis not present

## 2022-06-20 DIAGNOSIS — C9030 Solitary plasmacytoma not having achieved remission: Secondary | ICD-10-CM | POA: Diagnosis not present

## 2022-06-20 DIAGNOSIS — I110 Hypertensive heart disease with heart failure: Secondary | ICD-10-CM | POA: Diagnosis not present

## 2022-06-20 DIAGNOSIS — Z794 Long term (current) use of insulin: Secondary | ICD-10-CM | POA: Diagnosis not present

## 2022-06-20 DIAGNOSIS — I5032 Chronic diastolic (congestive) heart failure: Secondary | ICD-10-CM | POA: Insufficient documentation

## 2022-06-20 DIAGNOSIS — Z7984 Long term (current) use of oral hypoglycemic drugs: Secondary | ICD-10-CM | POA: Insufficient documentation

## 2022-06-20 DIAGNOSIS — M545 Low back pain, unspecified: Secondary | ICD-10-CM | POA: Insufficient documentation

## 2022-06-20 DIAGNOSIS — E119 Type 2 diabetes mellitus without complications: Secondary | ICD-10-CM | POA: Insufficient documentation

## 2022-06-20 DIAGNOSIS — D472 Monoclonal gammopathy: Secondary | ICD-10-CM | POA: Diagnosis not present

## 2022-06-20 DIAGNOSIS — Z79899 Other long term (current) drug therapy: Secondary | ICD-10-CM | POA: Insufficient documentation

## 2022-06-20 LAB — IMMUNOFIXATION ELECTROPHORESIS
IgA: 262 mg/dL (ref 87–352)
IgG (Immunoglobin G), Serum: 1747 mg/dL — ABNORMAL HIGH (ref 586–1602)
IgM (Immunoglobulin M), Srm: 138 mg/dL (ref 26–217)
Total Protein ELP: 7.5 g/dL (ref 6.0–8.5)

## 2022-06-20 MED ORDER — OXYCODONE HCL 5 MG PO TABS: 5.0000 mg | ORAL_TABLET | Freq: Two times a day (BID) | ORAL | 0 refills | Status: DC | PRN

## 2022-06-20 NOTE — Patient Instructions (Addendum)
[facility]  Discharge Instructions  You were seen and examined today by Dr. Delton Coombes.  Your labs are stable.  If your pain gets worse, please call the [facility].  Follow-up in 6 months with labs and the skeletal survey (bone X-rays for your whole body).  Thank you for choosing [facility] to provide your oncology and hematology care.   To afford each patient quality time with our provider, please arrive at least 15 minutes before your scheduled appointment time. You may need to reschedule your appointment if you arrive late (10 or more minutes). Arriving late affects you and other patients whose appointments are after yours.  Also, if you miss three or more appointments without notifying the office, you may be dismissed from the clinic at the provider's discretion.    Again, thank you for choosing [facility].  Our hope is that these requests will decrease the amount of time that you wait before being seen by our physicians.   If you have a lab appointment with the [facility] please come in thru the Main Entrance and check in at the main information desk.           _____________________________________________________________  Should you have questions after your visit to [facility], please contact our office at [phone] and follow the prompts.  Our office hours are 8:00 a.m. to 4:30 p.m. Monday - Thursday and 8:00 a.m. to 2:30 p.m. Friday.  Please note that voicemails left after 4:00 p.m. may not be returned until the following business day.  We are closed weekends and all major holidays.  You do have access to a nurse 24-7, just call the main number to the clinic [phone] and do not press any options, hold on the line and a nurse will answer the phone.    For prescription refill requests, have your pharmacy contact our office and allow 72 hours.    Masks are optional in the cancer  centers. If you would like for your care team to wear a mask while they are taking care of you, please let them know. You may have one support person who is at least 68 years old accompany you for your appointments.

## 2022-06-27 ENCOUNTER — Other Ambulatory Visit: Payer: Self-pay | Admitting: Family Medicine

## 2022-06-27 DIAGNOSIS — C9030 Solitary plasmacytoma not having achieved remission: Secondary | ICD-10-CM

## 2022-06-27 DIAGNOSIS — M5432 Sciatica, left side: Secondary | ICD-10-CM

## 2022-08-04 ENCOUNTER — Other Ambulatory Visit: Payer: Self-pay | Admitting: *Deleted

## 2022-08-04 DIAGNOSIS — G8918 Other acute postprocedural pain: Secondary | ICD-10-CM

## 2022-08-04 MED ORDER — HYDROCODONE-ACETAMINOPHEN 10-325 MG PO TABS: 1.0000 | ORAL_TABLET | Freq: Four times a day (QID) | ORAL | 0 refills | Status: DC | PRN

## 2022-08-05 ENCOUNTER — Other Ambulatory Visit (HOSPITAL_COMMUNITY)
Admission: RE | Admit: 2022-08-05 | Discharge: 2022-08-05 | Disposition: A | Discharge status: Home / Self Care | Payer: BC Managed Care – PPO | Source: Ambulatory Visit | Attending: Family Medicine | Admitting: Family Medicine

## 2022-08-05 DIAGNOSIS — D509 Iron deficiency anemia, unspecified: Secondary | ICD-10-CM | POA: Insufficient documentation

## 2022-08-05 DIAGNOSIS — E1159 Type 2 diabetes mellitus with other circulatory complications: Secondary | ICD-10-CM | POA: Diagnosis not present

## 2022-08-05 DIAGNOSIS — E7849 Other hyperlipidemia: Secondary | ICD-10-CM | POA: Diagnosis not present

## 2022-08-05 DIAGNOSIS — R5383 Other fatigue: Secondary | ICD-10-CM | POA: Diagnosis not present

## 2022-08-05 LAB — CBC WITH DIFFERENTIAL/PLATELET
Abs Immature Granulocytes: 0.01 10*3/uL (ref 0.00–0.07)
Basophils Absolute: 0 10*3/uL (ref 0.0–0.1)
Basophils Relative: 0 %
Eosinophils Absolute: 0.1 10*3/uL (ref 0.0–0.5)
Eosinophils Relative: 3 %
HCT: 38.3 % (ref 36.0–46.0)
Hemoglobin: 13 g/dL (ref 12.0–15.0)
Immature Granulocytes: 0 %
Lymphocytes Relative: 32 %
Lymphs Abs: 1.1 10*3/uL (ref 0.7–4.0)
MCH: 29.5 pg (ref 26.0–34.0)
MCHC: 33.9 g/dL (ref 30.0–36.0)
MCV: 87 fL (ref 80.0–100.0)
Monocytes Absolute: 0.4 10*3/uL (ref 0.1–1.0)
Monocytes Relative: 11 %
Neutro Abs: 1.8 10*3/uL (ref 1.7–7.7)
Neutrophils Relative %: 54 %
Platelets: 295 10*3/uL (ref 150–400)
RBC: 4.4 MIL/uL (ref 3.87–5.11)
RDW: 13.5 % (ref 11.5–15.5)
WBC: 3.5 10*3/uL — ABNORMAL LOW (ref 4.0–10.5)
nRBC: 0 % (ref 0.0–0.2)

## 2022-08-05 LAB — COMPREHENSIVE METABOLIC PANEL
ALT: 45 U/L — ABNORMAL HIGH (ref 0–44)
AST: 37 U/L (ref 15–41)
Albumin: 4.5 g/dL (ref 3.5–5.0)
Alkaline Phosphatase: 70 U/L (ref 38–126)
Anion gap: 10 (ref 5–15)
BUN: 18 mg/dL (ref 8–23)
CO2: 31 mmol/L (ref 22–32)
Calcium: 9.7 mg/dL (ref 8.9–10.3)
Chloride: 95 mmol/L — ABNORMAL LOW (ref 98–111)
Creatinine, Ser: 0.97 mg/dL (ref 0.44–1.00)
GFR, Estimated: >60 mL/min (ref >60)
Glucose, Bld: 118 mg/dL — ABNORMAL HIGH (ref 70–99)
Potassium: 2.9 mmol/L — ABNORMAL LOW (ref 3.5–5.1)
Sodium: 136 mmol/L (ref 135–145)
Total Bilirubin: 1 mg/dL (ref 0.3–1.2)
Total Protein: 8.7 g/dL — ABNORMAL HIGH (ref 6.5–8.1)

## 2022-08-05 LAB — LIPID PANEL
Cholesterol: 181 mg/dL (ref 0–200)
HDL: 43 mg/dL (ref >40)
LDL Cholesterol: 104 mg/dL — ABNORMAL HIGH (ref 0–99)
Total CHOL/HDL Ratio: 4.2 RATIO
Triglycerides: 170 mg/dL — ABNORMAL HIGH (ref <150)
VLDL: 34 mg/dL (ref 0–40)

## 2022-08-05 LAB — VITAMIN D 25 HYDROXY (VIT D DEFICIENCY, FRACTURES): Vit D, 25-Hydroxy: 29.64 ng/mL — ABNORMAL LOW (ref 30–100)

## 2022-08-05 LAB — TSH: TSH: 2.571 u[IU]/mL (ref 0.350–4.500)

## 2022-08-06 LAB — MICROALBUMIN / CREATININE URINE RATIO
Creatinine, Urine: 135.4 mg/dL
Microalb Creat Ratio: 62 mg/g{creat} — ABNORMAL HIGH (ref 0–29)
Microalb, Ur: 84.4 ug/mL — ABNORMAL HIGH

## 2022-08-11 ENCOUNTER — Telehealth: Payer: Self-pay | Admitting: Cardiology

## 2022-08-11 MED ORDER — LOSARTAN POTASSIUM 100 MG PO TABS: 100.0000 mg | ORAL_TABLET | Freq: Every day | ORAL | 1 refills | Status: DC

## 2022-08-11 NOTE — Telephone Encounter (Signed)
*  STAT* If patient is at the pharmacy, call can be transferred to refill team.   1. Which medications need to be refilled? (please list name of each medication and dose if known) losartan (COZAAR) 100 MG tablet   2. Which pharmacy/location (including street and city if local pharmacy) is medication to be sent to?  [facility] - [place], [place] - [address]    3. Do they need a 30 day or 90 day supply? 100

## 2022-08-12 ENCOUNTER — Encounter: Payer: Self-pay | Admitting: Cardiology

## 2022-08-12 ENCOUNTER — Other Ambulatory Visit (HOSPITAL_COMMUNITY)
Admission: RE | Admit: 2022-08-12 | Discharge: 2022-08-12 | Disposition: A | Discharge status: Home / Self Care | Payer: No Typology Code available for payment source | Source: Ambulatory Visit | Attending: Family Medicine | Admitting: Family Medicine

## 2022-08-12 ENCOUNTER — Other Ambulatory Visit (HOSPITAL_COMMUNITY)
Admission: RE | Admit: 2022-08-12 | Payer: No Typology Code available for payment source | Source: Ambulatory Visit | Admitting: Cardiology

## 2022-08-12 ENCOUNTER — Ambulatory Visit: Payer: No Typology Code available for payment source | Attending: Cardiology | Admitting: Cardiology

## 2022-08-12 VITALS — BP 122/88 | HR 58 | Ht 65.0 in | Wt 215.6 lb

## 2022-08-12 DIAGNOSIS — Z79899 Other long term (current) drug therapy: Secondary | ICD-10-CM

## 2022-08-12 DIAGNOSIS — E782 Mixed hyperlipidemia: Secondary | ICD-10-CM | POA: Diagnosis not present

## 2022-08-12 DIAGNOSIS — R002 Palpitations: Secondary | ICD-10-CM | POA: Diagnosis not present

## 2022-08-12 DIAGNOSIS — E876 Hypokalemia: Secondary | ICD-10-CM | POA: Insufficient documentation

## 2022-08-12 DIAGNOSIS — I5032 Chronic diastolic (congestive) heart failure: Secondary | ICD-10-CM | POA: Diagnosis not present

## 2022-08-12 DIAGNOSIS — I10 Essential (primary) hypertension: Secondary | ICD-10-CM

## 2022-08-12 LAB — COMPREHENSIVE METABOLIC PANEL
ALT: 47 U/L — ABNORMAL HIGH (ref 0–44)
AST: 41 U/L (ref 15–41)
Albumin: 4.1 g/dL (ref 3.5–5.0)
Alkaline Phosphatase: 63 U/L (ref 38–126)
Anion gap: 9 (ref 5–15)
BUN: 14 mg/dL (ref 8–23)
CO2: 30 mmol/L (ref 22–32)
Calcium: 9.3 mg/dL (ref 8.9–10.3)
Chloride: 100 mmol/L (ref 98–111)
Creatinine, Ser: 0.85 mg/dL (ref 0.44–1.00)
GFR, Estimated: >60 mL/min (ref >60)
Glucose, Bld: 121 mg/dL — ABNORMAL HIGH (ref 70–99)
Potassium: 3.6 mmol/L (ref 3.5–5.1)
Sodium: 139 mmol/L (ref 135–145)
Total Bilirubin: 0.7 mg/dL (ref 0.3–1.2)
Total Protein: 7.7 g/dL (ref 6.5–8.1)

## 2022-08-12 MED ORDER — ROSUVASTATIN CALCIUM 20 MG PO TABS: 20.0000 mg | ORAL_TABLET | Freq: Every day | ORAL | 6 refills | Status: DC

## 2022-08-12 MED ORDER — POTASSIUM CHLORIDE CRYS ER 20 MEQ PO TBCR: 40.0000 meq | EXTENDED_RELEASE_TABLET | Freq: Two times a day (BID) | ORAL | 0 refills | Status: DC

## 2022-08-12 NOTE — Progress Notes (Signed)
Clinical Summary Ms. Kanaan is a 67 y.o.female seen today for follow up of the following medical problems.      1. Palpitations - 2018 monitor PAC and PVCs - has been on diltiazem and lopressor   - no recent symptoms - compliant with meds     2. HTN - compliant with meds - home bp's 120s/80s   3. Chest pain - long history of atypical chest pain - cath 2014 with patent coronaries. -  admit 12/2014 with chest pain, negative evaluation for ACS.   - 06/2020 nuclear stresss no clear ischemia.    - denies any chest pain.    4. COPD - noted on 12/2013 PFTs    6. Chronic diastolic heart failure - echo 04/6107 LVEF 55-60%, abnormal diastolic function.   01/2020 echo :VEF 65-70%, indet diastolic fux   - no recent edema   6. Dyspnea - had CPX 06/2014, showed mild to mod reduced functoinal capacity       7. IgG kappa monoclonal gammopathy and bony lesion - both followed by heme/onc   8. Hypokalemia - chronic issue likely secondary to diuretic    9.Hyperlipidemia 07/2022 TC 181 TG 170 HDL 43 LDL 104   10. DM2 - glycemic control per pcp   SH: works at [employer] for kids. She has 3 adopted kids [phone]). Great grand baby is 16 year old   Father just passed away 07/11/22Past Medical History:  Diagnosis Date   Arthritis    Atypical chest pain    chronic   Cancer (HCC)    bone cancer   Chronic back pain    Chronic diastolic CHF (congestive heart failure) (HCC)    COPD (chronic obstructive pulmonary disease) (HCC)    DDD (degenerative disc disease), cervical    Diabetes mellitus without complication (HCC)    History of radiation therapy 07/16/19-08/22/19   L4 spine ; Dr. Antony Blackbird   Hyperlipidemia    Hypertension    Lumbar radiculopathy    Meningitis    Normal coronary arteries 2014   Palpitations    Premature atrial contractions    PVC's (premature ventricular contractions)      Allergies  Allergen Reactions   Other Swelling     Avon lipstick     Current Outpatient Medications  Medication Sig Dispense Refill   albuterol (PROVENTIL) (2.5 MG/3ML) 0.083% nebulizer solution Take 2.5 mg by nebulization every 6 (six) hours as needed for wheezing or shortness of breath.     albuterol (VENTOLIN HFA) 108 (90 Base) MCG/ACT inhaler Inhale 1-2 puffs into the lungs every 4 (four) hours as needed for wheezing or shortness of breath. 18 g 0   cetirizine (ZYRTEC) 10 MG tablet Take 1 tablet (10 mg total) by mouth daily. 30 tablet 0   chlorthalidone (HYGROTON) 50 MG tablet Take 1 tablet (50 mg total) by mouth daily. 90 tablet 3   diltiazem (CARDIZEM) 60 MG tablet Take 1 tablet (60 mg total) by mouth 2 (two) times daily. (MAY TAKE AN ADDITIONAL TAB AS NEEDED FOR PALPITATIONS) 180 tablet 3   famotidine (PEPCID) 20 MG tablet Take 1 tablet (20 mg total) by mouth 2 (two) times daily for 7 days. 14 tablet 0   furosemide (LASIX) 20 MG tablet Take 1 tablet daily as needed may take 2 tablets prn 90 tablet 3   HYDROcodone-acetaminophen (NORCO) 10-325 MG tablet Take 1 tablet by mouth every 6 (six) hours as needed for moderate pain. 30  tablet 0   ibuprofen (ADVIL) 800 MG tablet Take 1 tablet (800 mg total) by mouth every 8 (eight) hours as needed. 90 tablet 1   Insulin Glargine (BASAGLAR KWIKPEN) 100 UNIT/ML Inject 15 Units into the skin at bedtime as needed (blood sugar over 150).     isosorbide mononitrate (IMDUR) 30 MG 24 hr tablet Take 1 tablet (30 mg total) by mouth daily. 90 tablet 3   lidocaine (LIDODERM) 5 % Place 2 patches onto the skin daily. Remove & Discard patch within 12 hours or as directed by MD (Patient taking differently: Place 2 patches onto the skin daily as needed (pain). Remove & Discard patch within 12 hours or as directed by MD) 60 patch 1   losartan (COZAAR) 100 MG tablet Take 1 tablet (100 mg total) by mouth daily. 90 tablet 1   magnesium oxide (MAG-OX) 400 (240 Mg) MG tablet Take 1 tablet (400 mg total) by mouth 2 (two)  times daily for 5 days, THEN 1 tablet (400 mg total) daily. 95 tablet 1   metFORMIN (GLUCOPHAGE) 500 MG tablet Take 500 mg by mouth 2 (two) times daily.     metoprolol succinate (TOPROL-XL) 50 MG 24 hr tablet Take 1 tablet (50 mg total) by mouth daily. 90 tablet 3   Multiple Vitamin (MULTIVITAMIN WITH MINERALS) TABS tablet Take 1 tablet by mouth daily.     nitroGLYCERIN (NITROSTAT) 0.4 MG SL tablet Place 0.4 mg under the tongue every 5 (five) minutes as needed for chest pain.     oxyCODONE (ROXICODONE) 5 MG immediate release tablet Take 1 tablet (5 mg total) by mouth 2 (two) times daily as needed for severe pain or breakthrough pain. 45 tablet 0   potassium chloride SA (KLOR-CON M) 20 MEQ tablet Take 1 tablet (20 mEq total) by mouth daily. 90 tablet 3   rosuvastatin (CRESTOR) 10 MG tablet Take 1 tablet (10 mg total) by mouth at bedtime. 90 tablet 3   triamcinolone ointment (KENALOG) 0.1 % Apply 1 Application topically 2 (two) times daily. Apply sparingly twice daily to clean skin as needed 30 g 0   TURMERIC PO Take 600 mg by mouth daily.     No current facility-administered medications for this visit.     Past Surgical History:  Procedure Laterality Date   ABDOMINAL HYSTERECTOMY     BREAST LUMPECTOMY WITH RADIOFREQUENCY TAG IDENTIFICATION Right 01/25/2021   Procedure: BREAST LUMPECTOMY WITH RADIOFREQUENCY TAG IDENTIFICATION;  Surgeon: Lucretia Roers, MD;  Location: [facility];  Service: General;  Laterality: Right;   EXCISION OF BREAST BIOPSY Right 01/25/2021   Procedure: EXCISION OF BREAST BIOPSY;  Surgeon: Lucretia Roers, MD;  Location: [facility];  Service: General;  Laterality: Right;   IR FLUORO GUIDED NEEDLE PLC ASPIRATION/INJECTION LOC  06/19/2019   LEFT HEART CATHETERIZATION WITH CORONARY ANGIOGRAM N/A 09/05/2012   Procedure: LEFT HEART CATHETERIZATION WITH CORONARY ANGIOGRAM;  Surgeon: Iran Ouch, MD;  Location: [facility];  Service: Cardiovascular;  Laterality: N/A;   RESECTION  DISTAL CLAVICAL Left 07/03/2020   Procedure: RESECTION DISTAL CLAVICAL;  Surgeon: Vickki Hearing, MD;  Location: [facility];  Service: Orthopedics;  Laterality: Left;   SHOULDER OPEN ROTATOR CUFF REPAIR Left 07/03/2020   Procedure: ROTATOR CUFF REPAIR SHOULDER OPEN WITH CHROMEOPLASTY;  Surgeon: Vickki Hearing, MD;  Location: [facility];  Service: Orthopedics;  Laterality: Left;   SHOULDER OPEN ROTATOR CUFF REPAIR Left 07/02/2021   Procedure: ROTATOR CUFF REPAIR SHOULDER OPEN with Patch Graft;  Surgeon: Vickki Hearing, MD;  Location: [facility];  Service: Orthopedics;  Laterality: Left;   THYROID SURGERY  2002     Allergies  Allergen Reactions   Other Swelling    Avon lipstick      Family History  Problem Relation Age of Onset   Heart attack Mother 75   Heart attack Sister 27   Breast cancer Maternal Grandmother    Breast cancer Cousin      Social History Ms. Mule reports that she has never smoked. She has never been exposed to tobacco smoke. She has never used smokeless tobacco. Ms. Collinsworth reports no history of alcohol use.   Review of Systems CONSTITUTIONAL: No weight loss, fever, chills, weakness or fatigue.  HEENT: Eyes: No visual loss, blurred vision, double vision or yellow sclerae.No hearing loss, sneezing, congestion, runny nose or sore throat.  SKIN: No rash or itching.  CARDIOVASCULAR: per hpi RESPIRATORY: No shortness of breath, cough or sputum.  GASTROINTESTINAL: No anorexia, nausea, vomiting or diarrhea. No abdominal pain or blood.  GENITOURINARY: No burning on urination, no polyuria NEUROLOGICAL: No headache, dizziness, syncope, paralysis, ataxia, numbness or tingling in the extremities. No change in bowel or bladder control.  MUSCULOSKELETAL: No muscle, back pain, joint pain or stiffness.  LYMPHATICS: No enlarged nodes. No history of splenectomy.  PSYCHIATRIC: No history of depression or anxiety.  ENDOCRINOLOGIC: No reports of sweating, cold or heat  intolerance. No polyuria or polydipsia.  Marland Kitchen   Physical Examination Today's Vitals   08/12/22 0828  BP: 122/88  Pulse: (!) 58  SpO2: 97%  Weight: 215 lb 9.6 oz (97.8 kg)  Height: 5\' 5"  (1.651 m)   Body mass index is 35.88 kg/m.  Gen: resting comfortably, no acute distress HEENT: no scleral icterus, pupils equal round and reactive, no palptable cervical adenopathy,  CV: RRR, no mrg, no jvd Resp: Clear to auscultation bilaterally GI: abdomen is soft, non-tender, non-distended, normal bowel sounds, no hepatosplenomegaly MSK: extremities are warm, no edema.  Skin: warm, no rash Neuro:  no focal deficits Psych: appropriate affect   Diagnostic Studies 08/2012 cath Hemodynamics: AO:  164/82   mmHg LV:  169/14    mmHg LVEDP: 24  mmHg   Coronary angiography: Coronary dominance: Right    Left Main:  Normal Left Anterior Descending (LAD):  Normal in size with no significant disease. 1st diagonal (D1):  Small in size with minor irregularities. 2nd diagonal (D2):  Normal in size with no significant disease. 3rd diagonal (D3):  Normal in size with no significant disease. Circumflex (LCx):  Normal in size and nondominant. The vessel has no significant disease. 1st obtuse marginal:  Small in size with no significant disease. 2nd obtuse marginal:  Small in size with no significant disease.  3rd obtuse marginal:  Large in size with no significant disease.   Right Coronary Artery: Normal in size and dominant. The vessel has no significant disease. posterior descending artery: Normal in size with no significant disease. posterior lateral branchs:  Normal in size with no significant disease.   Left ventriculography: Left ventricular systolic function is normal , LVEF is estimated at 60-65% %, there is no significant mitral regurgitation    Final Conclusions:    1. Normal coronary arteries. 2. Normal LV systolic function. 3. Moderately elevated left ventricular end-diastolic pressure  likely due to diastolic heart failure.     06/2013 echo Study Conclusions  - Procedure narrative: Transthoracic echocardiography. Image   quality was suboptimal. The study was technically  difficult, as a result of poor sound wave transmission and   restricted patient mobility. - Left ventricle: The cavity size was normal. Wall thickness   was increased in a pattern of mild to moderate LVH.   Diastolic dysfunction noted, grade indeterminant. Systolic   function was normal. The estimated ejection fraction was   in the range of 55% to 60%. Wall motion was normal; there   were no regional wall motion abnormalities.     Jan 2017 Event monitor: no arrhythmias   06/2014 CPX Conclusion: Exercise testing with gas exchange demonstrates a mild-moderately reduced functional capacity when compared to matched sedentary norms. There was an in-adequate HR response to the exercise response and flat O2 pulse. This high HR reserve was likely high due to pulmonary limitations based on restrictive spirometry and RR 45 at peak exercise and reaching maximum ventilatory limits. However, it could have easily been due to poor effort. The sub-maximal effort is clouding interpretation of the test. Nevertheless, correlation of resting spirometry test  Showing restriction with outpatient pulmonary function test is warranted     05/2016 heart monitor Telemetry tracings predominately show sinus rhythm Reported symptoms correlate with occasional PVCs and PACs       02/2016 echo Study Conclusions   - Left ventricle: The cavity size was normal. Systolic function was   vigorous. The estimated ejection fraction was in the range of 65%   to 70%. Wall motion was normal; there were no regional wall   motion abnormalities. There was an increased relative   contribution of atrial contraction to ventricular filling.   Doppler parameters are consistent with abnormal left ventricular   relaxation (grade 1  diastolic dysfunction). - Pulmonic valve: There was trivial regurgitation. - Pulmonary arteries: Systolic pressure could not be accurately   estimated.          Assessment and Plan   1. Palpitations - doing well without significant symptoms, continue current meds     2.HTN - she is at goal, continue current meds   3. Chronic diastolic HF - euvolemic without symptoms, continue current diuretic   4. Hypokalemia - low K, increase KCl to bid x 2 days then daily. - recheck bmet/mg 1 week   5 Hyperlipidemia - LDL above goal, increase crestor to 20mg  daily  Antoine Poche, M.D.

## 2022-08-12 NOTE — Patient Instructions (Signed)
Medication Instructions:   Increase Crestor to 20mg  daily  Increase Potassium to twice a day x 2 days, then back to previous daily dosing  Continue all other medications.     Labwork:  BMET, Mg - orders given  Please do in 1 week  Office will contact with results via phone, letter or mychart.     Testing/Procedures:  none  Follow-Up:  6 months   Any Other Special Instructions Will Be Listed Below (If Applicable).   If you need a refill on your cardiac medications before your next appointment, please call your pharmacy.

## 2022-09-07 ENCOUNTER — Other Ambulatory Visit (HOSPITAL_COMMUNITY)
Admission: RE | Admit: 2022-09-07 | Discharge: 2022-09-07 | Disposition: A | Discharge status: Home / Self Care | Payer: No Typology Code available for payment source | Source: Ambulatory Visit | Attending: Cardiology | Admitting: Cardiology

## 2022-09-07 DIAGNOSIS — Z79899 Other long term (current) drug therapy: Secondary | ICD-10-CM | POA: Diagnosis present

## 2022-09-07 DIAGNOSIS — I10 Essential (primary) hypertension: Secondary | ICD-10-CM | POA: Insufficient documentation

## 2022-09-07 DIAGNOSIS — I5032 Chronic diastolic (congestive) heart failure: Secondary | ICD-10-CM | POA: Insufficient documentation

## 2022-09-07 LAB — BASIC METABOLIC PANEL
Anion gap: 9 (ref 5–15)
BUN: 16 mg/dL (ref 8–23)
CO2: 27 mmol/L (ref 22–32)
Calcium: 9.1 mg/dL (ref 8.9–10.3)
Chloride: 104 mmol/L (ref 98–111)
Creatinine, Ser: 0.73 mg/dL (ref 0.44–1.00)
GFR, Estimated: >60 mL/min (ref >60)
Glucose, Bld: 119 mg/dL — ABNORMAL HIGH (ref 70–99)
Potassium: 4 mmol/L (ref 3.5–5.1)
Sodium: 140 mmol/L (ref 135–145)

## 2022-09-07 LAB — MAGNESIUM: Magnesium: 1.5 mg/dL — ABNORMAL LOW (ref 1.7–2.4)

## 2022-09-15 ENCOUNTER — Ambulatory Visit
Admission: RE | Admit: 2022-09-15 | Discharge: 2022-09-15 | Disposition: A | Discharge status: Home / Self Care | Payer: No Typology Code available for payment source | Source: Ambulatory Visit | Attending: Family Medicine | Admitting: Family Medicine

## 2022-09-15 DIAGNOSIS — M545 Low back pain, unspecified: Secondary | ICD-10-CM | POA: Diagnosis not present

## 2022-09-15 DIAGNOSIS — M5432 Sciatica, left side: Secondary | ICD-10-CM

## 2022-09-15 DIAGNOSIS — Z8579 Personal history of other malignant neoplasms of lymphoid, hematopoietic and related tissues: Secondary | ICD-10-CM | POA: Diagnosis not present

## 2022-09-15 DIAGNOSIS — C9030 Solitary plasmacytoma not having achieved remission: Secondary | ICD-10-CM

## 2022-09-15 MED ORDER — GADOPICLENOL 0.5 MMOL/ML IV SOLN
9.0000 mL | Freq: Once | INTRAVENOUS | Status: AC | PRN
  Administered 2022-09-15: 9 mL via INTRAVENOUS

## 2022-09-20 ENCOUNTER — Other Ambulatory Visit: Payer: Self-pay | Admitting: *Deleted

## 2022-09-20 DIAGNOSIS — G8918 Other acute postprocedural pain: Secondary | ICD-10-CM

## 2022-09-21 MED ORDER — HYDROCODONE-ACETAMINOPHEN 10-325 MG PO TABS: 1.0000 | ORAL_TABLET | Freq: Four times a day (QID) | ORAL | 0 refills | Status: DC | PRN

## 2022-09-26 ENCOUNTER — Encounter: Payer: Self-pay | Admitting: *Deleted

## 2022-10-05 IMAGING — MR MR SHOULDER*L* W/O CM
7 series · 40 of 40 positions shown · non-contrast
Comparison: None.

CLINICAL DATA: Left shoulder pain and reduced range of motion.

EXAM:
MRI OF THE LEFT SHOULDER WITHOUT CONTRAST
TECHNIQUE: Multiplanar, multisequence MR imaging of the shoulder was performed.
No intravenous contrast was administered.

[Series 2: T2 fat-sat · axial · left · 4.0mm · 0.49mm/px · z∈[-44,+45]mm · 7 of 20 slices shown (1 of 4)]
[im 1/20]
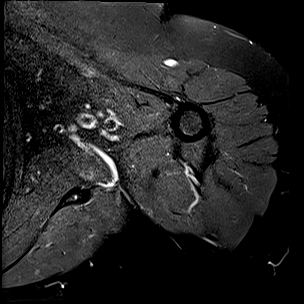
[im 4/20]
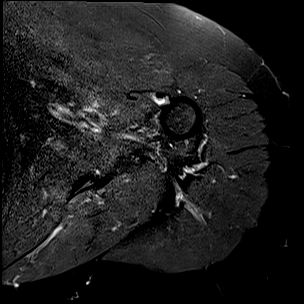
[im 7/20]
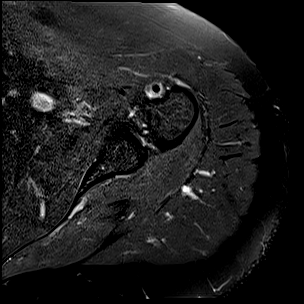
[im 10/20]
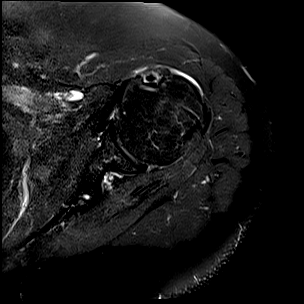
[im 13/20]
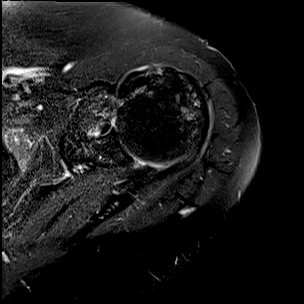
[im 16/20]
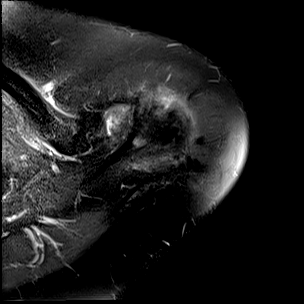
[im 20/20]
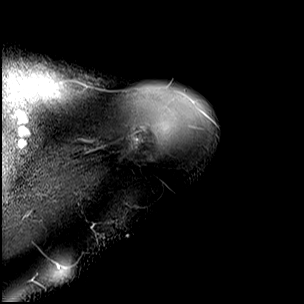

[Series 3: T1 · oblique · left · 4.0mm · 0.41mm/px · 7 of 19 slices shown]
[im 1/19]
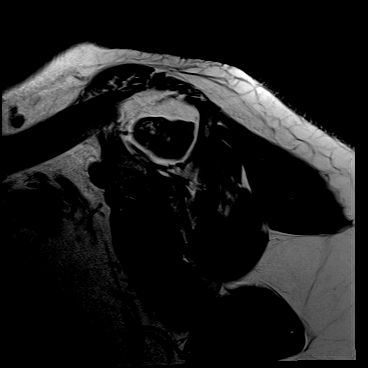
[im 4/19]
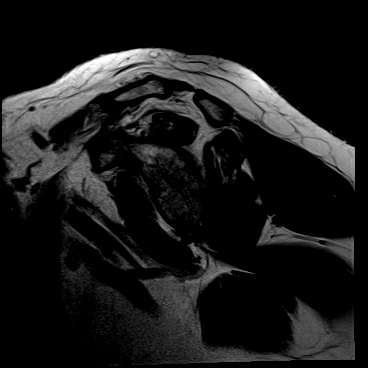
[im 7/19]
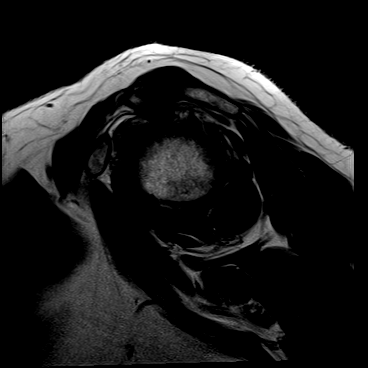
[im 10/19]
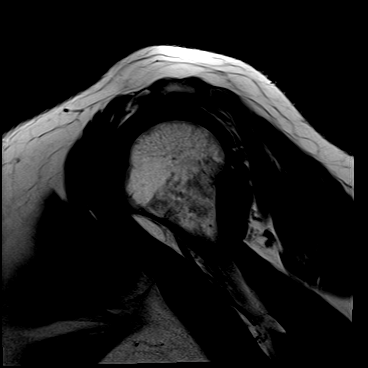
[im 13/19]
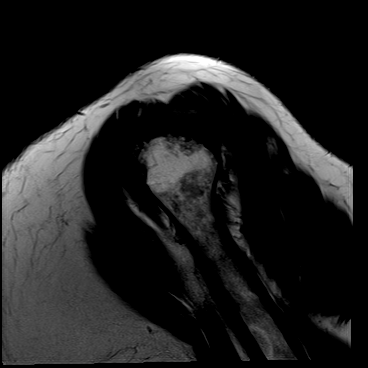
[im 16/19]
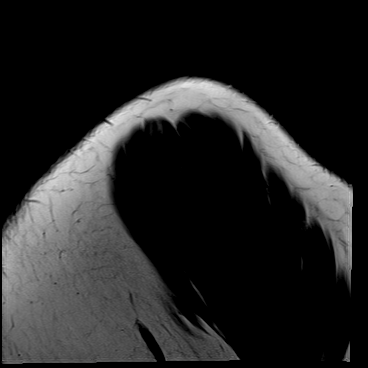
[im 19/19]
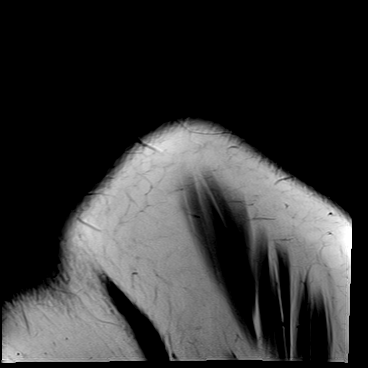

[Series 4: T2 fat-sat · oblique · left · 4.0mm · 0.47mm/px · 6 of 19 slices shown (2 of 4)]
[im 1/19]
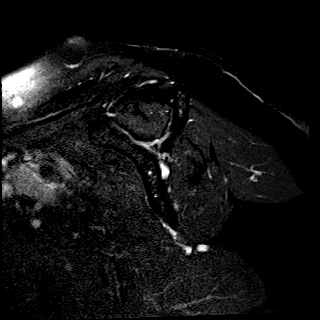
[im 4/19]
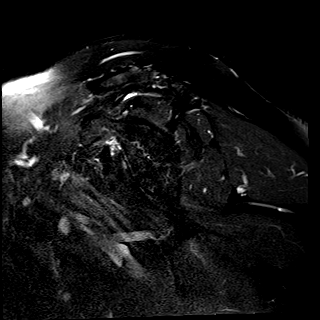
[im 8/19]
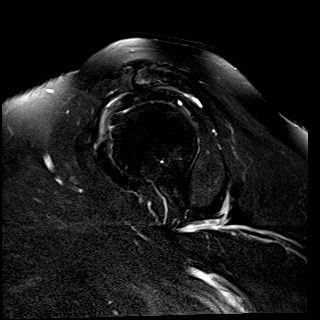
[im 11/19]
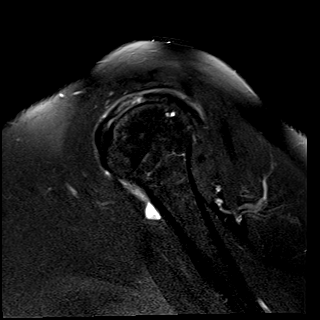
[im 15/19]
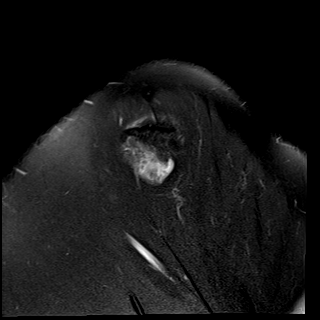
[im 19/19]
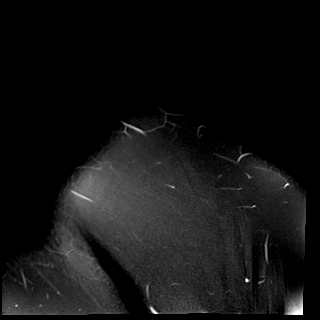

[Series 5: T2 fat-sat · oblique · left · 4.0mm · 0.47mm/px · 5 of 17 slices shown (3 of 4)]
[im 1/17]
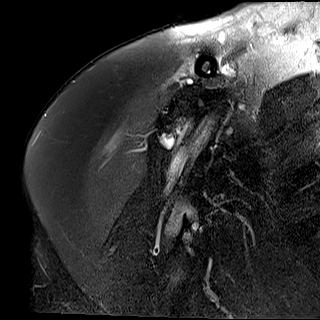
[im 5/17]
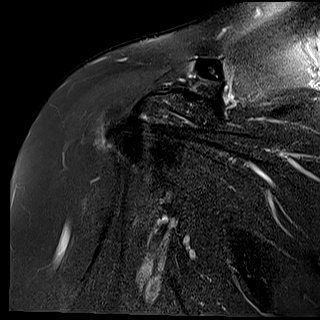
[im 9/17]
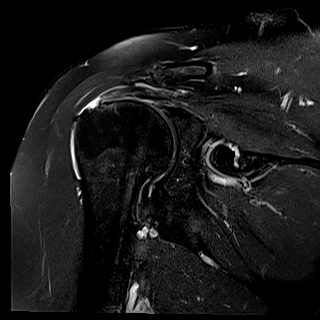
[im 13/17]
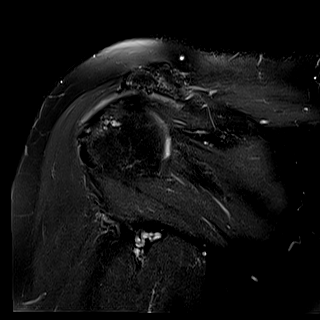
[im 17/17]
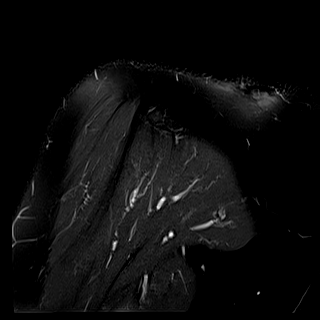

[Series 6: PD · oblique · left · 4.0mm · 0.43mm/px · 5 of 17 slices shown (1 of 2)]
[im 1/17]
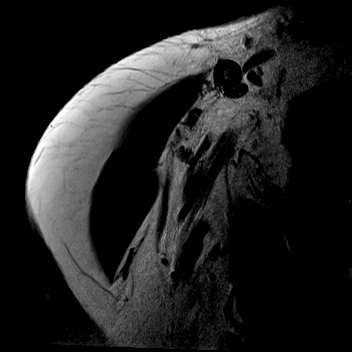
[im 5/17]
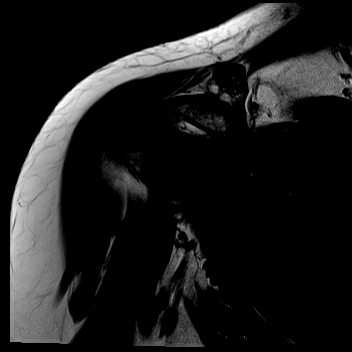
[im 9/17]
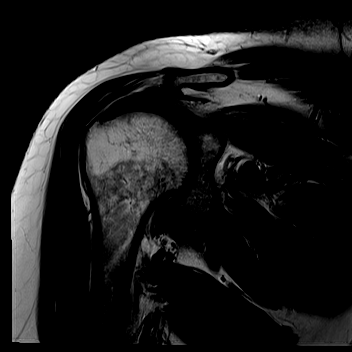
[im 13/17]
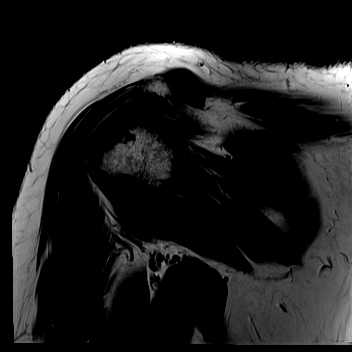
[im 17/17]
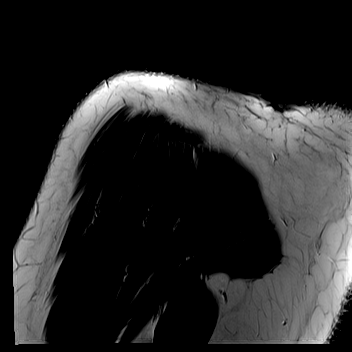

[Series 1008: T2 fat-sat · oblique · left · 4.0mm · 0.47mm/px · 5 of 17 slices shown (4 of 4)]
[im 1/17]
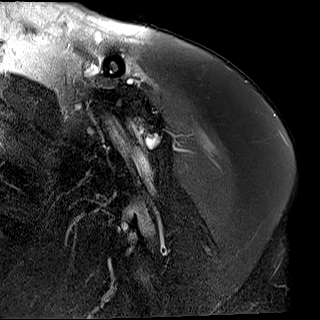
[im 5/17]
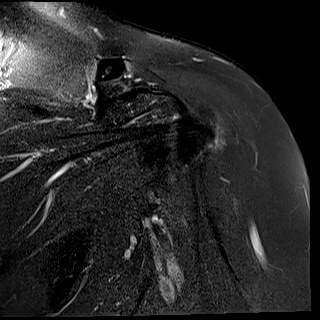
[im 9/17]
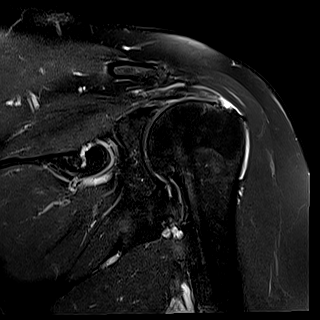
[im 13/17]
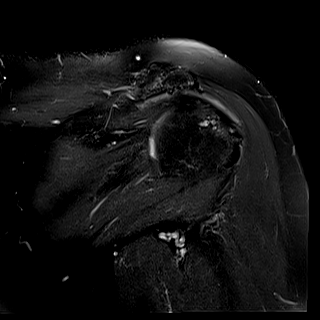
[im 17/17]
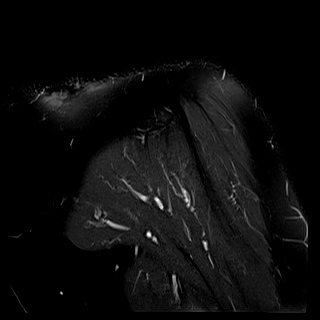

[Series 1012: PD · oblique · left · 4.0mm · 0.43mm/px · 5 of 17 slices shown (2 of 2)]
[im 1/17]
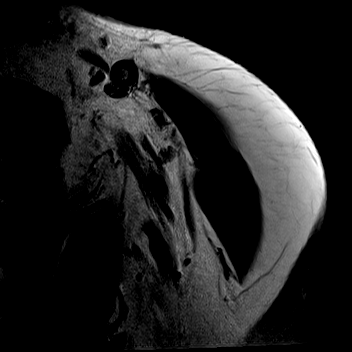
[im 5/17]
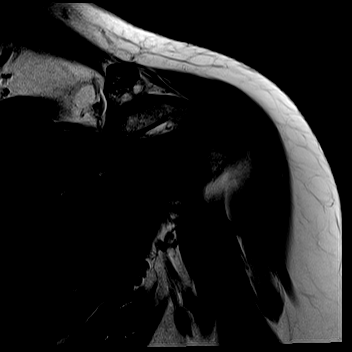
[im 9/17]
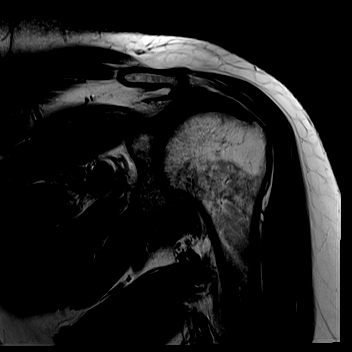
[im 13/17]
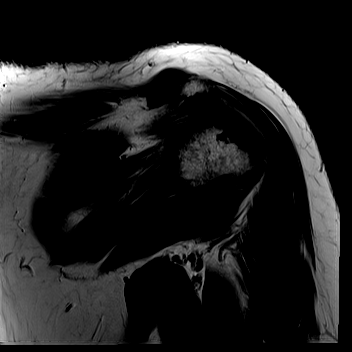
[im 17/17]
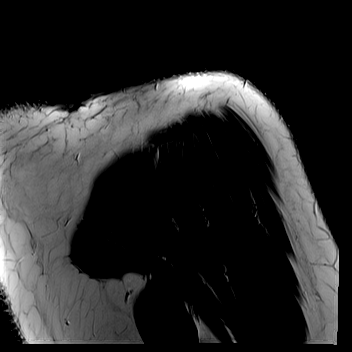

[40 of 40 positions shown; findings below may reference images not displayed]

FINDINGS: Rotator cuff: Full-thickness partial width tear of the anterior
supraspinatus distally, with 1.1 cm retraction and with the width of
the tear about 1.1 cm as well. Moderate supraspinatus tendinopathy
with mild infraspinatus and subscapularis tendinopathy. Mild
fissuring along the infraspinatus myotendinous junction.

Muscles:  No significant muscular edema or atrophy.

Biceps long head:  Unremarkable

Acromioclavicular Joint: Moderate spurring and moderate subcortical
marrow edema along the AC joints. Type III acromion. As expected,
there is fluid in the subacromial subdeltoid bursa.

Glenohumeral Joint: Moderate degenerative chondral thinning.

Labrum: Potential tear of the posterior labrum on images 9-12 of
series 2, although a similar appearance can sometimes be caused by
and invagination of the capsule adjacent to the labrum simulating a
tear.

Bones: No significant extra-articular osseous abnormalities
identified.

Other: No supplemental non-categorized findings.
IMPRESSION: 1. Full-thickness partial width tear of the anterior supraspinatus
tendon distally, with 1.1 cm retraction.
2. Moderate supraspinatus tendinopathy with mild infraspinatus and
subscapularis tendinopathy.
3. Moderate degenerative chondral thinning in the glenohumeral
joint. Moderate degenerative AC joint arthropathy with unfavorable
subacromial morphology.
4. Potential tear of the posterior labrum, although a similar
appearance can be caused by and invagination of the capsule adjacent
to the labrum simulating a tear.

## 2022-10-06 DIAGNOSIS — H524 Presbyopia: Secondary | ICD-10-CM | POA: Diagnosis not present

## 2022-12-16 ENCOUNTER — Inpatient Hospital Stay: Payer: BC Managed Care – PPO | Attending: Hematology

## 2022-12-16 ENCOUNTER — Other Ambulatory Visit: Payer: Self-pay

## 2022-12-16 DIAGNOSIS — C9030 Solitary plasmacytoma not having achieved remission: Secondary | ICD-10-CM | POA: Diagnosis present

## 2022-12-16 DIAGNOSIS — G8918 Other acute postprocedural pain: Secondary | ICD-10-CM

## 2022-12-16 DIAGNOSIS — D472 Monoclonal gammopathy: Secondary | ICD-10-CM

## 2022-12-16 LAB — CBC WITH DIFFERENTIAL/PLATELET
Abs Immature Granulocytes: 0 10*3/uL (ref 0.00–0.07)
Basophils Absolute: 0 10*3/uL (ref 0.0–0.1)
Basophils Relative: 1 %
Eosinophils Absolute: 0.2 10*3/uL (ref 0.0–0.5)
Eosinophils Relative: 6 %
HCT: 34.5 % — ABNORMAL LOW (ref 36.0–46.0)
Hemoglobin: 11.5 g/dL — ABNORMAL LOW (ref 12.0–15.0)
Immature Granulocytes: 0 %
Lymphocytes Relative: 35 %
Lymphs Abs: 1.2 10*3/uL (ref 0.7–4.0)
MCH: 29.8 pg (ref 26.0–34.0)
MCHC: 33.3 g/dL (ref 30.0–36.0)
MCV: 89.4 fL (ref 80.0–100.0)
Monocytes Absolute: 0.3 10*3/uL (ref 0.1–1.0)
Monocytes Relative: 10 %
Neutro Abs: 1.7 10*3/uL (ref 1.7–7.7)
Neutrophils Relative %: 48 %
Platelets: 253 10*3/uL (ref 150–400)
RBC: 3.86 MIL/uL — ABNORMAL LOW (ref 3.87–5.11)
RDW: 14.5 % (ref 11.5–15.5)
WBC: 3.4 10*3/uL — ABNORMAL LOW (ref 4.0–10.5)
nRBC: 0.6 % — ABNORMAL HIGH (ref 0.0–0.2)

## 2022-12-16 LAB — COMPREHENSIVE METABOLIC PANEL
ALT: 32 U/L (ref 0–44)
AST: 25 U/L (ref 15–41)
Albumin: 4.1 g/dL (ref 3.5–5.0)
Alkaline Phosphatase: 67 U/L (ref 38–126)
Anion gap: 8 (ref 5–15)
BUN: 15 mg/dL (ref 8–23)
CO2: 29 mmol/L (ref 22–32)
Calcium: 9.2 mg/dL (ref 8.9–10.3)
Chloride: 102 mmol/L (ref 98–111)
Creatinine, Ser: 0.89 mg/dL (ref 0.44–1.00)
GFR, Estimated: >60 mL/min (ref >60)
Glucose, Bld: 125 mg/dL — ABNORMAL HIGH (ref 70–99)
Potassium: 3.3 mmol/L — ABNORMAL LOW (ref 3.5–5.1)
Sodium: 139 mmol/L (ref 135–145)
Total Bilirubin: 0.6 mg/dL (ref 0.3–1.2)
Total Protein: 7.6 g/dL (ref 6.5–8.1)

## 2022-12-16 MED ORDER — HYDROCODONE-ACETAMINOPHEN 10-325 MG PO TABS: 1.0000 | ORAL_TABLET | Freq: Four times a day (QID) | ORAL | 0 refills | Status: DC | PRN

## 2022-12-19 LAB — IMMUNOFIXATION ELECTROPHORESIS
IgA: 222 mg/dL (ref 87–352)
IgG (Immunoglobin G), Serum: 1637 mg/dL — ABNORMAL HIGH (ref 586–1602)
IgM (Immunoglobulin M), Srm: 126 mg/dL (ref 26–217)
Total Protein ELP: 7.1 g/dL (ref 6.0–8.5)

## 2022-12-20 LAB — PROTEIN ELECTROPHORESIS, SERUM
A/G Ratio: 1.1 (ref 0.7–1.7)
Albumin ELP: 3.8 g/dL (ref 2.9–4.4)
Alpha-1-Globulin: 0.2 g/dL (ref 0.0–0.4)
Alpha-2-Globulin: 0.6 g/dL (ref 0.4–1.0)
Beta Globulin: 1 g/dL (ref 0.7–1.3)
Gamma Globulin: 1.6 g/dL (ref 0.4–1.8)
Globulin, Total: 3.4 g/dL (ref 2.2–3.9)
Total Protein ELP: 7.2 g/dL (ref 6.0–8.5)

## 2022-12-20 LAB — KAPPA/LAMBDA LIGHT CHAINS
Kappa free light chain: 36.2 mg/L — ABNORMAL HIGH (ref 3.3–19.4)
Kappa, lambda light chain ratio: 1.44 (ref 0.26–1.65)
Lambda free light chains: 25.1 mg/L (ref 5.7–26.3)

## 2022-12-22 ENCOUNTER — Inpatient Hospital Stay: Payer: No Typology Code available for payment source | Admitting: Hematology

## 2022-12-22 NOTE — Progress Notes (Incomplete)
[facility] [address] [place] [address]    Clinic Day:  12/22/2022  Referring physician: Avis Epley, PA*  Patient Care Team: Ladon Applebaum as PCP - General (Family Medicine) Wyline Mood Dorothe Pea, MD as PCP - Cardiology (Cardiology) Jodelle Gross, NP as Nurse Practitioner (Nurse Practitioner) Mickie Bail, RN as Oncology Nurse Navigator (Oncology) Doreatha Massed, MD as Medical Oncologist (Oncology)   ASSESSMENT & PLAN:   Assessment: 1.  Plasmacytoma of L4 vertebral body: -MRI lumbar spine shows L4 lesion. -PET scan on 06/10/2019 shows mildly increased FDG uptake associated with mixed lytic/sclerotic lesion involving L4 vertebral body SUV 4.4. -Serum immunofixation shows IgG kappa monoclonal protein.  SPEP-no M spike.  Kappa light chains elevated at 25.3.  Ratio 1.12.  Lambda light chains 21.9.  Beta-2 microglobulin 2.2. -24-hour urine was negative for UPEP and immunofixation.  Protein was 130 mg. -L4 needle biopsy on 06/19/2019 showed minute segments of the bone and soft tissue, limited cellularity.  IHC for CD138 highlights scattering plasma cells.  Cytokeratin is negative.  Few kappa positive plasma cells present.  Overall material is very limited and essentially nondiagnostic. -Clinically this is consistent with plasmacytoma.  We reviewed bone marrow biopsy results which showed normocellular marrow.  Cytogenetics are normal. -XRT to L4 vertebral body from 07/16/2019 through 08/22/2019. -MRI of the lumbar spine on 12/03/2019 showed unchanged size of the L4 vertebral body lesion, slightly decreased contrast-enhancement with no new lesions.   2.  Low back pain: -She is taking half tablet of hydrocodone 10/325 every 6 hours as needed which is helping. -Likely this will improve upon completion of radiation.  3.  Right breast intraductal papilloma: - Biopsy on 12/01/2020 at 3 o'clock position of the right breast with intraductal  papilloma with florid usual ductal hyperplasia and sclerosing fibrosis. - Right breast lumpectomy on 01/25/2021 with intraductal papilloma with florid UDH and calcifications 1.2 cm.  Margins negative.  Plan: 1.  Plasmacytoma of L4 vertebral body: - She does not have any new onset pains. - Reviewed labs from 06/13/2022: M spike is not observed.  Kappa light chains are elevated and stable.  Immunofixation shows polyclonal increase.  Free light chain ratio is normal at 1.64.  Hemoglobin is 11.7 and normal calcium and creatinine. - She does not have any "crab" features. - RTC 6 months for follow-up with repeat labs and skeletal survey.   2.  Low back pain: - She is taking oxycodone 5 mg daily as needed.  Sometimes she needs twice daily.  I will increase the prescription number to 45 tablets.   3.  Severe hypokalemia: - Continue potassium supplements as directed.  Last potassium was 2.8.  She has taken additional supplements.  4.  Right breast intraductal papilloma and florid UDH: - Continue yearly mammograms.  No orders of the defined types were placed in this encounter.     Alben Deeds Teague,acting as a Neurosurgeon for Doreatha Massed, MD.,have documented all relevant documentation on the behalf of Doreatha Massed, MD,as directed by  Doreatha Massed, MD while in the presence of Doreatha Massed, MD.  ***   [place] R Teague   9/5/20249:03 AM  CHIEF COMPLAINT:   Diagnosis: L4 plasmacytoma   Cancer Staging  No matching staging information was found for the patient.    Prior Therapy: XRT in 27 fractions from 07/17/2019 to 08/22/2019   Current Therapy:  surveillance   HISTORY OF PRESENT ILLNESS:   Oncology History  No history exists.     INTERVAL HISTORY:   Hannah Cooper is a 67 y.o. female presenting to clinic today for follow up of L4 plasmacytoma. She was last seen by me on 06/20/22.  Today, she states that she is doing well overall. Her appetite level is at ***%. Her  energy level is at ***%.     PAST MEDICAL HISTORY:   Past Medical History: Past Medical History:  Diagnosis Date   Arthritis    Atypical chest pain    chronic   Cancer (HCC)    bone cancer   Chronic back pain    Chronic diastolic CHF (congestive heart failure) (HCC)    COPD (chronic obstructive pulmonary disease) (HCC)    DDD (degenerative disc disease), cervical    Diabetes mellitus without complication (HCC)    History of radiation therapy 07/16/19-08/22/19   L4 spine ; Dr. Antony Blackbird   Hyperlipidemia    Hypertension    Lumbar radiculopathy    Meningitis    Normal coronary arteries 2014   Palpitations    Premature atrial contractions    PVC's (premature ventricular contractions)     Surgical History: Past Surgical History:  Procedure Laterality Date   ABDOMINAL HYSTERECTOMY     BREAST LUMPECTOMY WITH RADIOFREQUENCY TAG IDENTIFICATION Right 01/25/2021   Procedure: BREAST LUMPECTOMY WITH RADIOFREQUENCY TAG IDENTIFICATION;  Surgeon: Lucretia Roers, MD;  Location: [facility];  Service: General;  Laterality: Right;   EXCISION OF BREAST BIOPSY Right 01/25/2021   Procedure: EXCISION OF BREAST BIOPSY;  Surgeon: Lucretia Roers, MD;  Location: [facility];  Service: General;  Laterality: Right;   IR FLUORO GUIDED NEEDLE PLC ASPIRATION/INJECTION LOC  06/19/2019   LEFT HEART CATHETERIZATION WITH CORONARY ANGIOGRAM N/A 09/05/2012   Procedure: LEFT HEART CATHETERIZATION WITH CORONARY ANGIOGRAM;  Surgeon: Iran Ouch, MD;  Location: [facility];  Service: Cardiovascular;  Laterality: N/A;   RESECTION DISTAL CLAVICAL Left 07/03/2020   Procedure: RESECTION DISTAL CLAVICAL;  Surgeon: Vickki Hearing, MD;  Location: [facility];  Service: Orthopedics;  Laterality: Left;   SHOULDER OPEN ROTATOR CUFF REPAIR Left 07/03/2020   Procedure: ROTATOR CUFF REPAIR SHOULDER OPEN WITH CHROMEOPLASTY;  Surgeon: Vickki Hearing, MD;  Location: [facility];  Service: Orthopedics;  Laterality: Left;    SHOULDER OPEN ROTATOR CUFF REPAIR Left 07/02/2021   Procedure: ROTATOR CUFF REPAIR SHOULDER OPEN with Patch Graft;  Surgeon: Vickki Hearing, MD;  Location: [facility];  Service: Orthopedics;  Laterality: Left;   THYROID SURGERY  2002    Social History: Social History   Socioeconomic History   Marital status: Married    Spouse name: Not on file   Number of children: Not on file   Years of education: Not on file   Highest education level: Not on file  Occupational History   Not on file  Tobacco Use   Smoking status: Never    Passive exposure: Never   Smokeless tobacco: Never  Vaping Use   Vaping status: Never Used  Substance and Sexual Activity   Alcohol use: No    Alcohol/week: 0.0 standard drinks of alcohol   Drug use: No   Sexual activity: Yes    Partners: Male  Other Topics Concern   Not on file  Social History Narrative   Not on file   Social Determinants of Health   Financial Resource Strain: Low Risk  (05/28/2019)   Overall Financial Resource Strain (CARDIA)    Difficulty of Paying Living Expenses: Not hard  at all  Food Insecurity: No Food Insecurity (05/28/2019)   Hunger Vital Sign    Worried About Running Out of Food in the Last Year: Never true    Ran Out of Food in the Last Year: Never true  Transportation Needs: No Transportation Needs (05/28/2019)   PRAPARE - Administrator, Civil Service (Medical): No    Lack of Transportation (Non-Medical): No  Physical Activity: Inactive (05/28/2019)   Exercise Vital Sign    Days of Exercise per Week: 0 days    Minutes of Exercise per Session: 0 min  Stress: Stress Concern Present (05/28/2019)   [employer] of Occupational Health - Occupational Stress Questionnaire    Feeling of Stress : To some extent  Social Connections: Unknown (08/31/2021)   Received from [facility], [facility]   Social Network    Social Network: Not on file  Intimate Partner Violence: Unknown (07/23/2021)   Received from [facility], [facility]   HITS    Physically Hurt: Not on file    Insult or Talk Down To: Not on file    Threaten Physical Harm: Not on file    Scream or Curse: Not on file    Family History: Family History  Problem Relation Age of Onset   Heart attack Mother 60   Heart attack Sister 92   Breast cancer Maternal Grandmother    Breast cancer Cousin     Current Medications:  Current Outpatient Medications:    albuterol (PROVENTIL) (2.5 MG/3ML) 0.083% nebulizer solution, Take 2.5 mg by nebulization every 6 (six) hours as needed for wheezing or shortness of breath., Disp: , Rfl:    albuterol (VENTOLIN HFA) 108 (90 Base) MCG/ACT inhaler, Inhale 1-2 puffs into the lungs every 4 (four) hours as needed for wheezing or shortness of breath., Disp: 18 g, Rfl: 0   cetirizine (ZYRTEC) 10 MG tablet, Take 1 tablet (10 mg total) by mouth daily., Disp: 30 tablet, Rfl: 0   chlorthalidone (HYGROTON) 50 MG tablet, Take 1 tablet (50 mg total) by mouth daily., Disp: 90 tablet, Rfl: 3   diltiazem (CARDIZEM) 60 MG tablet, Take 1 tablet (60 mg total) by mouth 2 (two) times daily. (MAY TAKE AN ADDITIONAL TAB AS NEEDED FOR PALPITATIONS), Disp: 180 tablet, Rfl: 3   furosemide (LASIX) 20 MG tablet, Take 1 tablet daily as needed may take 2 tablets prn, Disp: 90 tablet, Rfl: 3   HYDROcodone-acetaminophen (NORCO) 10-325 MG tablet, Take 1 tablet by mouth every 6 (six) hours as needed for moderate pain., Disp: 30 tablet, Rfl: 0   ibuprofen (ADVIL) 800 MG tablet, Take 1 tablet (800 mg total) by mouth every 8 (eight) hours as needed., Disp: 90 tablet, Rfl: 1   Insulin Glargine (BASAGLAR KWIKPEN) 100 UNIT/ML, Inject 15 Units into the skin at bedtime as needed (blood sugar over 150). (Patient not taking: Reported on 08/12/2022), Disp: , Rfl:    isosorbide mononitrate (IMDUR) 30 MG 24 hr tablet, Take 1 tablet (30 mg total) by mouth daily., Disp: 90 tablet, Rfl: 3   JARDIANCE 10 MG TABS tablet, Take 10 mg by mouth daily., Disp: ,  Rfl:    lidocaine (LIDODERM) 5 %, Place 2 patches onto the skin daily. Remove & Discard patch within 12 hours or as directed by MD (Patient taking differently: Place 2 patches onto the skin daily as needed (pain). Remove & Discard patch within 12 hours or as directed by MD), Disp: 60 patch, Rfl: 1   losartan (  COZAAR) 100 MG tablet, Take 1 tablet (100 mg total) by mouth daily., Disp: 90 tablet, Rfl: 1   magnesium oxide (MAG-OX) 400 (240 Mg) MG tablet, Take 1 tablet (400 mg total) by mouth 2 (two) times daily for 5 days, THEN 1 tablet (400 mg total) daily., Disp: 95 tablet, Rfl: 1   metFORMIN (GLUCOPHAGE) 500 MG tablet, Take 500 mg by mouth 2 (two) times daily. (Patient not taking: Reported on 08/12/2022), Disp: , Rfl:    metoprolol succinate (TOPROL-XL) 50 MG 24 hr tablet, Take 1 tablet (50 mg total) by mouth daily., Disp: 90 tablet, Rfl: 3   Multiple Vitamin (MULTIVITAMIN WITH MINERALS) TABS tablet, Take 1 tablet by mouth daily., Disp: , Rfl:    nitroGLYCERIN (NITROSTAT) 0.4 MG SL tablet, Place 0.4 mg under the tongue every 5 (five) minutes as needed for chest pain., Disp: , Rfl:    oxyCODONE (ROXICODONE) 5 MG immediate release tablet, Take 1 tablet (5 mg total) by mouth 2 (two) times daily as needed for severe pain or breakthrough pain., Disp: 45 tablet, Rfl: 0   potassium chloride SA (KLOR-CON M) 20 MEQ tablet, Take 1 tablet (20 mEq total) by mouth daily., Disp: 90 tablet, Rfl: 3   potassium chloride SA (KLOR-CON M) 20 MEQ tablet, Take 2 tablets (40 mEq total) by mouth 2 (two) times daily for 2 days., Disp: 8 tablet, Rfl: 0   rosuvastatin (CRESTOR) 20 MG tablet, Take 1 tablet (20 mg total) by mouth daily., Disp: 30 tablet, Rfl: 6   TURMERIC PO, Take 600 mg by mouth daily., Disp: , Rfl:    Allergies: Allergies  Allergen Reactions   Other Swelling    [place] lipstick    REVIEW OF SYSTEMS:   Review of Systems  Constitutional:  Negative for chills, fatigue and fever.  HENT:   Negative for  lump/mass, mouth sores, nosebleeds, sore throat and trouble swallowing.   Eyes:  Negative for eye problems.  Respiratory:  Negative for cough and shortness of breath.   Cardiovascular:  Negative for chest pain, leg swelling and palpitations.  Gastrointestinal:  Negative for abdominal pain, constipation, diarrhea, nausea and vomiting.  Genitourinary:  Negative for bladder incontinence, difficulty urinating, dysuria, frequency, hematuria and nocturia.   Musculoskeletal:  Negative for arthralgias, back pain, flank pain, myalgias and neck pain.  Skin:  Negative for itching and rash.  Neurological:  Negative for dizziness, headaches and numbness.  Hematological:  Does not bruise/bleed easily.  Psychiatric/Behavioral:  Negative for depression, sleep disturbance and suicidal ideas. The patient is not nervous/anxious.   All other systems reviewed and are negative.    VITALS:   There were no vitals taken for this visit.  Wt Readings from Last 3 Encounters:  08/12/22 215 lb 9.6 oz (97.8 kg)  06/20/22 218 lb (98.9 kg)  02/17/22 226 lb (102.5 kg)    There is no height or weight on file to calculate BMI.  Performance status (ECOG): 1 - Symptomatic but completely ambulatory  PHYSICAL EXAM:   Physical Exam Vitals and nursing note reviewed. Exam conducted with a chaperone present.  Constitutional:      Appearance: Normal appearance.  Cardiovascular:     Rate and Rhythm: Normal rate and regular rhythm.     Pulses: Normal pulses.     Heart sounds: Normal heart sounds.  Pulmonary:     Effort: Pulmonary effort is normal.     Breath sounds: Normal breath sounds.  Abdominal:     Palpations: Abdomen is soft. There  is no hepatomegaly, splenomegaly or mass.     Tenderness: There is no abdominal tenderness.  Musculoskeletal:     Right lower leg: No edema.     Left lower leg: No edema.  Lymphadenopathy:     Cervical: No cervical adenopathy.     Right cervical: No superficial, deep or posterior  cervical adenopathy.    Left cervical: No superficial, deep or posterior cervical adenopathy.     Upper Body:     Right upper body: No supraclavicular or axillary adenopathy.     Left upper body: No supraclavicular or axillary adenopathy.  Neurological:     General: No focal deficit present.     Mental Status: She is alert and oriented to person, place, and time.  Psychiatric:        Mood and Affect: Mood normal.        Behavior: Behavior normal.     LABS:      Latest Ref Rng & Units 12/16/2022   12:52 PM 08/05/2022   12:22 PM 06/13/2022    9:10 AM  CBC  WBC 4.0 - 10.5 K/uL 3.4  3.5  3.9   Hemoglobin 12.0 - 15.0 g/dL 11.9  14.7  82.9   Hematocrit 36.0 - 46.0 % 34.5  38.3  35.1   Platelets 150 - 400 K/uL 253  295  308       Latest Ref Rng & Units 12/16/2022   12:52 PM 09/07/2022    9:51 AM 08/12/2022   10:09 AM  CMP  Glucose 70 - 99 mg/dL 562  130  865   BUN 8 - 23 mg/dL 15  16  14    Creatinine 0.44 - 1.00 mg/dL 7.84  6.96  2.95   Sodium 135 - 145 mmol/L 139  140  139   Potassium 3.5 - 5.1 mmol/L 3.3  4.0  3.6   Chloride 98 - 111 mmol/L 102  104  100   CO2 22 - 32 mmol/L 29  27  30    Calcium 8.9 - 10.3 mg/dL 9.2  9.1  9.3   Total Protein 6.5 - 8.1 g/dL 7.6   7.7   Total Bilirubin 0.3 - 1.2 mg/dL 0.6   0.7   Alkaline Phos 38 - 126 U/L 67   63   AST 15 - 41 U/L 25   41   ALT 0 - 44 U/L 32   47      No results found for: "CEA1", "CEA" / No results found for: "CEA1", "CEA" No results found for: "PSA1" No results found for: "CAN199" No results found for: "CAN125"  Lab Results  Component Value Date   TOTALPROTELP 7.2 12/16/2022   ALBUMINELP 3.8 12/16/2022   A1GS 0.2 12/16/2022   A2GS 0.6 12/16/2022   BETS 1.0 12/16/2022   GAMS 1.6 12/16/2022   MSPIKE Not Observed 12/16/2022   SPEI Comment 12/16/2022   Lab Results  Component Value Date   FERRITIN 346 (H) 11/13/2019   Lab Results  Component Value Date   LDH 158 11/09/2021   LDH 159 05/03/2021   LDH 144  01/27/2021     STUDIES:   No results found.

## 2023-01-19 ENCOUNTER — Inpatient Hospital Stay: Payer: No Typology Code available for payment source | Admitting: Hematology

## 2023-01-23 ENCOUNTER — Ambulatory Visit: Payer: No Typology Code available for payment source

## 2023-01-23 ENCOUNTER — Ambulatory Visit
Admission: EM | Admit: 2023-01-23 | Discharge: 2023-01-23 | Disposition: A | Discharge status: Home / Self Care | Payer: No Typology Code available for payment source | Attending: Nurse Practitioner | Admitting: Nurse Practitioner

## 2023-01-23 DIAGNOSIS — J020 Streptococcal pharyngitis: Secondary | ICD-10-CM

## 2023-01-23 LAB — POCT RAPID STREP A (OFFICE): Rapid Strep A Screen: POSITIVE — AB

## 2023-01-23 MED ORDER — AMOXICILLIN 500 MG PO CAPS: 500.0000 mg | ORAL_CAPSULE | Freq: Two times a day (BID) | ORAL | 0 refills | Status: AC

## 2023-01-23 NOTE — Discharge Instructions (Addendum)
The rapid strep test was positive. Take medication as prescribed. Increase fluids and allow for plenty of rest. Recommend over-the-counter Tylenol or ibuprofen as needed for pain, fever, or general discomfort. Warm salt water gargles 3-4 times daily to help with throat pain or discomfort. Recommend a diet with soft foods to include soups, broths, puddings, yogurt, Jell-O's, or popsicles until symptoms improve. Change toothbrush after 3 days. If symptoms do not improve with this treatment, please follow-up with your primary care physician or in this clinic for further evaluation. Follow-up as needed.

## 2023-01-23 NOTE — ED Triage Notes (Signed)
Sore throat, fatigue, body aches that started Wednesday. Pt states her daughter was positive for strep throat last week.

## 2023-01-23 NOTE — ED Provider Notes (Signed)
[facility]    CSN: [id number] Arrival date & time: 01/23/23  1125      History   Chief Complaint Chief Complaint  Patient presents with   Sore Throat    HPI Hannah Cooper is a 67 y.o. female.   The history is provided by the patient.   Patient presents for complaints of strep throat that has been present for the past 5 days.  Patient also complains of bodyaches, fatigue, and "feeling hot" at home.  She denies headache, ear pain, ear drainage, cough, chest pain, abdominal pain, nausea, vomiting, or diarrhea.  Reports she has been taking ibuprofen for her symptoms.  Patient reports that her daughter tested positive for strep throat last week.  Past Medical History:  Diagnosis Date   Arthritis    Atypical chest pain    chronic   Cancer (HCC)    bone cancer   Chronic back pain    Chronic diastolic CHF (congestive heart failure) (HCC)    COPD (chronic obstructive pulmonary disease) (HCC)    DDD (degenerative disc disease), cervical    Diabetes mellitus without complication (HCC)    History of radiation therapy 07/16/19-08/22/19   L4 spine ; Dr. 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   Plasma cell disorder 06/10/2019   Lesion of bone of lumbosacral spine 05/28/2019   Left-sided weakness 03/13/2016   Cerebrovascular accident (CVA) (HCC)    Palpitations    COPD (chronic obstructive pulmonary disease) (HCC) 08/20/2014   Dyspnea 12/19/2013   Chronic  diastolic CHF (congestive heart failure) (HCC) 07/05/2013   Lower extremity edema 07/05/2013   Hyperlipidemia    Chest pain 07/04/2013   Hypokalemia 09/04/2012   Precordial pain 09/04/2012   Viral meningitis 01/22/2011   PNEUMONIA, LEFT LOWER LOBE 10/10/2006   DISEASE, ACUTE BRONCHOSPASM 10/10/2006   Essential hypertension 05/23/2006   OSTEOARTHRITIS 05/23/2006    Past Surgical History:  Procedure Laterality Date   ABDOMINAL HYSTERECTOMY     BREAST LUMPECTOMY WITH RADIOFREQUENCY TAG IDENTIFICATION Right 01/25/2021   Procedure: BREAST LUMPECTOMY WITH RADIOFREQUENCY TAG IDENTIFICATION;  Surgeon: Lucretia Roers, MD;  Location: [facility];  Service: General;  Laterality: Right;   EXCISION OF BREAST BIOPSY Right 01/25/2021   Procedure: EXCISION OF BREAST BIOPSY;  Surgeon: Lucretia Roers, MD;  Location: [facility];  Service: General;  Laterality: Right;   IR FLUORO GUIDED NEEDLE PLC ASPIRATION/INJECTION LOC  06/19/2019   LEFT HEART CATHETERIZATION WITH CORONARY ANGIOGRAM N/A 09/05/2012   Procedure: LEFT HEART CATHETERIZATION WITH CORONARY ANGIOGRAM;  Surgeon: Iran Ouch, MD;  Location: [facility];  Service: Cardiovascular;  Laterality: N/A;   RESECTION DISTAL CLAVICAL Left 07/03/2020   Procedure: RESECTION DISTAL CLAVICAL;  Surgeon: Vickki Hearing, MD;  Location: [facility];  Service: Orthopedics;  Laterality: Left;   SHOULDER OPEN ROTATOR CUFF REPAIR Left 07/03/2020   Procedure: ROTATOR CUFF REPAIR SHOULDER OPEN WITH CHROMEOPLASTY;  Surgeon: Vickki Hearing,  MD;  Location: [facility];  Service: Orthopedics;  Laterality: Left;   SHOULDER OPEN ROTATOR CUFF REPAIR Left 07/02/2021   Procedure: ROTATOR CUFF REPAIR SHOULDER OPEN with Patch Graft;  Surgeon: Vickki Hearing, MD;  Location: [facility];  Service: Orthopedics;  Laterality: Left;   THYROID SURGERY  2002    OB History     Gravida      Para      Term      Preterm      AB      Living  0      SAB      IAB       Ectopic      Multiple      Live Births               Home Medications    Prior to Admission medications   Medication Sig Start Date End Date Taking? Authorizing Provider  amoxicillin (AMOXIL) 500 MG capsule Take 1 capsule (500 mg total) by mouth 2 (two) times daily for 10 days. 01/23/23 02/02/23 Yes Kimon Loewen-Warren, Sadie Haber, NP  chlorthalidone (HYGROTON) 50 MG tablet Take 1 tablet (50 mg total) by mouth daily. 02/17/22  Yes Branch, Dorothe Pea, MD  diltiazem (CARDIZEM) 60 MG tablet Take 1 tablet (60 mg total) by mouth 2 (two) times daily. (MAY TAKE AN ADDITIONAL TAB AS NEEDED FOR PALPITATIONS) 02/17/22  Yes Branch, Dorothe Pea, MD  HYDROcodone-acetaminophen (NORCO) 10-325 MG tablet Take 1 tablet by mouth every 6 (six) hours as needed for moderate pain. 12/16/22  Yes Doreatha Massed, MD  ibuprofen (ADVIL) 800 MG tablet Take 1 tablet (800 mg total) by mouth every 8 (eight) hours as needed. 01/04/21  Yes Vickki Hearing, MD  isosorbide mononitrate (IMDUR) 30 MG 24 hr tablet Take 1 tablet (30 mg total) by mouth daily. 02/17/22  Yes Branch, Dorothe Pea, MD  JARDIANCE 10 MG TABS tablet Take 10 mg by mouth daily. 07/27/22  Yes [provider]  losartan (COZAAR) 100 MG tablet Take 1 tablet (100 mg total) by mouth daily. 08/11/22  Yes BranchDorothe Pea, MD  metoprolol succinate (TOPROL-XL) 50 MG 24 hr tablet Take 1 tablet (50 mg total) by mouth daily. 02/17/22  Yes Branch, Dorothe Pea, MD  potassium chloride SA (KLOR-CON M) 20 MEQ tablet Take 1 tablet (20 mEq total) by mouth daily. 03/08/22  Yes BranchDorothe Pea, MD  rosuvastatin (CRESTOR) 20 MG tablet Take 1 tablet (20 mg total) by mouth daily. 08/12/22  Yes Antoine Poche, MD  TURMERIC PO Take 600 mg by mouth daily.   Yes [provider]  albuterol (PROVENTIL) (2.5 MG/3ML) 0.083% nebulizer solution Take 2.5 mg by nebulization every 6 (six) hours as needed for wheezing or shortness of breath.    [provider]   albuterol (VENTOLIN HFA) 108 (90 Base) MCG/ACT inhaler Inhale 1-2 puffs into the lungs every 4 (four) hours as needed for wheezing or shortness of breath. 11/13/19   Derwood Kaplan, MD  cetirizine (ZYRTEC) 10 MG tablet Take 1 tablet (10 mg total) by mouth daily. 05/16/22   Valentino Nose, NP  furosemide (LASIX) 20 MG tablet Take 1 tablet daily as needed may take 2 tablets prn 02/17/22   Antoine Poche, MD  Insulin Glargine [facility]) 100 UNIT/ML Inject 15 Units into the skin at bedtime as needed (blood sugar over 150). Patient not taking: Reported on 08/12/2022 10/04/19   [provider]  lidocaine (LIDODERM) 5 % Place  2 patches onto the skin daily. Remove & Discard patch within 12 hours or as directed by MD Patient taking differently: Place 2 patches onto the skin daily as needed (pain). Remove & Discard patch within 12 hours or as directed by MD 04/23/20   Raulkar, Drema Pry, MD  magnesium oxide (MAG-OX) 400 (240 Mg) MG tablet Take 1 tablet (400 mg total) by mouth 2 (two) times daily for 5 days, THEN 1 tablet (400 mg total) daily. 03/01/22 03/06/23  Antoine Poche, MD  metFORMIN (GLUCOPHAGE) 500 MG tablet Take 500 mg by mouth 2 (two) times daily. Patient not taking: Reported on 08/12/2022 12/10/21   [provider]  Multiple Vitamin (MULTIVITAMIN WITH MINERALS) TABS tablet Take 1 tablet by mouth daily.    [provider]  nitroGLYCERIN (NITROSTAT) 0.4 MG SL tablet Place 0.4 mg under the tongue every 5 (five) minutes as needed for chest pain. 04/10/21   [provider]  oxyCODONE (ROXICODONE) 5 MG immediate release tablet Take 1 tablet (5 mg total) by mouth 2 (two) times daily as needed for severe pain or breakthrough pain. 06/20/22 06/20/23  Doreatha Massed, MD  potassium chloride SA (KLOR-CON M) 20 MEQ tablet Take 2 tablets (40 mEq total) by mouth 2 (two) times daily for 2 days. 08/12/22 08/14/22  Antoine Poche, MD    Family History Family  History  Problem Relation Age of Onset   Heart attack Mother 93   Heart attack Sister 79   Breast cancer Maternal Grandmother    Breast cancer Cousin     Social History Social History   Tobacco Use   Smoking status: Never    Passive exposure: Never   Smokeless tobacco: Never  Vaping Use   Vaping status: Never Used  Substance Use Topics   Alcohol use: No    Alcohol/week: 0.0 standard drinks of alcohol   Drug use: No     Allergies   Other   Review of Systems Review of Systems Per HPI  Physical Exam Triage Vital Signs ED Triage Vitals  Encounter Vitals Group     BP 01/23/23 1343 (!) 140/74     Systolic BP Percentile --      Diastolic BP Percentile --      Pulse Rate 01/23/23 1343 (!) 57     Resp 01/23/23 1343 16     Temp 01/23/23 1343 98.3 F (36.8 C)     Temp Source 01/23/23 1343 Oral     SpO2 01/23/23 1343 96 %     Weight --      Height --      Head Circumference --      Peak Flow --      Pain Score 01/23/23 1347 9     Pain Loc --      Pain Education --      Exclude from Growth Chart --    No data found.  Updated Vital Signs BP (!) 140/74 (BP Location: Right Arm)   Pulse (!) 57   Temp 98.3 F (36.8 C) (Oral)   Resp 16   SpO2 96%   Visual Acuity Right Eye Distance:   Left Eye Distance:   Bilateral Distance:    Right Eye Near:   Left Eye Near:    Bilateral Near:     Physical Exam Vitals and nursing note reviewed.  Constitutional:      General: She is not in acute distress.    Appearance: She is well-developed.  HENT:  Head: Normocephalic.     Right Ear: Tympanic membrane and ear canal normal.     Left Ear: Tympanic membrane and ear canal normal.     Nose: Congestion present.     Mouth/Throat:     Mouth: Mucous membranes are moist.     Pharynx: Pharyngeal swelling and posterior oropharyngeal erythema present.     Tonsils: No tonsillar exudate. 1+ on the right. 1+ on the left.  Eyes:     Conjunctiva/sclera: Conjunctivae normal.      Pupils: Pupils are equal, round, and reactive to light.  Cardiovascular:     Rate and Rhythm: Normal rate and regular rhythm.     Heart sounds: Normal heart sounds.  Pulmonary:     Effort: Pulmonary effort is normal. No respiratory distress.     Breath sounds: Normal breath sounds. No stridor. No wheezing, rhonchi or rales.  Abdominal:     General: Bowel sounds are normal.     Palpations: Abdomen is soft.     Tenderness: There is no abdominal tenderness.  Musculoskeletal:     Cervical back: Normal range of motion.  Lymphadenopathy:     Cervical: No cervical adenopathy.  Skin:    General: Skin is warm and dry.  Neurological:     General: No focal deficit present.     Mental Status: She is alert and oriented to person, place, and time.  Psychiatric:        Mood and Affect: Mood normal.        Behavior: Behavior normal.      UC Treatments / Results  Labs (all labs ordered are listed, but only abnormal results are displayed) Labs Reviewed  POCT RAPID STREP A (OFFICE) - Abnormal; Notable for the following components:      Result Value   Rapid Strep A Screen Positive (*)    All other components within normal limits    EKG   Radiology No results found.  Procedures Procedures (including critical care time)  Medications Ordered in UC Medications - No data to display  Initial Impression / Assessment and Plan / UC Course  I have reviewed the triage vital signs and the nursing notes.  Pertinent labs & imaging results that were available during my care of the patient were reviewed by me and considered in my medical decision making (see chart for details).  The patient is well-appearing, she is in no acute distress, vital signs are stable.  Rapid strep test is positive.  Will treat with amoxicillin 500 mg twice daily for the next 10 days.  Supportive care recommendations were provided and discussed with the patient to include over-the-counter analgesics, warm salt water  gargles, and a soft diet.  Patient was advised to follow-up in this clinic or with her primary care physician if symptoms fail to improve.  Patient is in agreement with this plan of care and verbalizes understanding.  All questions were answered.  Patient stable for discharge.   Final Clinical Impressions(s) / UC Diagnoses   Final diagnoses:  Streptococcal sore throat     Discharge Instructions      The rapid strep test was positive. Take medication as prescribed. Increase fluids and allow for plenty of rest. Recommend over-the-counter Tylenol or ibuprofen as needed for pain, fever, or general discomfort. Warm salt water gargles 3-4 times daily to help with throat pain or discomfort. Recommend a diet with soft foods to include soups, broths, puddings, yogurt, Jell-O's, or popsicles until symptoms improve. Change toothbrush after  3 days. If symptoms do not improve with this treatment, please follow-up with your primary care physician or in this clinic for further evaluation. Follow-up as needed.    ED Prescriptions     Medication Sig Dispense Auth. Provider   amoxicillin (AMOXIL) 500 MG capsule Take 1 capsule (500 mg total) by mouth 2 (two) times daily for 10 days. 20 capsule Domnique Vanegas-Warren, Sadie Haber, NP      PDMP not reviewed this encounter.   Abran Cantor, NP 01/23/23 1415

## 2023-01-25 ENCOUNTER — Inpatient Hospital Stay: Payer: No Typology Code available for payment source | Admitting: Hematology

## 2023-01-27 ENCOUNTER — Encounter: Payer: Self-pay | Admitting: Cardiology

## 2023-01-27 ENCOUNTER — Ambulatory Visit: Payer: No Typology Code available for payment source | Attending: Cardiology | Admitting: Cardiology

## 2023-01-27 VITALS — BP 130/80 | HR 46 | Ht 65.0 in | Wt 215.0 lb

## 2023-01-27 DIAGNOSIS — I10 Essential (primary) hypertension: Secondary | ICD-10-CM | POA: Diagnosis not present

## 2023-01-27 DIAGNOSIS — I5032 Chronic diastolic (congestive) heart failure: Secondary | ICD-10-CM

## 2023-01-27 DIAGNOSIS — R002 Palpitations: Secondary | ICD-10-CM | POA: Diagnosis not present

## 2023-01-27 DIAGNOSIS — Z79899 Other long term (current) drug therapy: Secondary | ICD-10-CM | POA: Diagnosis not present

## 2023-01-27 DIAGNOSIS — E782 Mixed hyperlipidemia: Secondary | ICD-10-CM | POA: Diagnosis not present

## 2023-01-27 MED ORDER — POTASSIUM CHLORIDE CRYS ER 20 MEQ PO TBCR: 20.0000 meq | EXTENDED_RELEASE_TABLET | Freq: Every day | ORAL | 3 refills | Status: AC

## 2023-01-27 MED ORDER — DILTIAZEM HCL 30 MG PO TABS: 30.0000 mg | ORAL_TABLET | Freq: Two times a day (BID) | ORAL | 1 refills | Status: AC

## 2023-01-27 MED ORDER — NITROGLYCERIN 0.4 MG SL SUBL: 0.4000 mg | SUBLINGUAL_TABLET | SUBLINGUAL | 2 refills | Status: AC | PRN

## 2023-01-27 NOTE — Progress Notes (Signed)
Clinical Summary Hannah Cooper is a 67 y.o.female seen today for follow up of the following medical problems.      1. Palpitations - 2018 monitor PAC and PVCs - has been on diltiazem and lopressor     EKG today sinus brady at 49 - some palpitations related to stress, husband became ill and just recently passed 12/2022.      2. HTN - she is compliant with meds - home bp's 120s/90s   3. Chest pain - long history of atypical chest pain - cath 2014 with patent coronaries. -  admit 12/2014 with chest pain, negative evaluation for ACS.   - 06/2020 nuclear stresss no clear ischemia.    - no recent symptoms.    4. COPD - noted on 12/2013 PFTs      6. Chronic diastolic heart failure - echo 04/6107 LVEF 55-60%, abnormal diastolic function.   01/2020 echo :VEF 65-70%, indet diastolic fux    - no recent SOB/DOE, no recent edema - has prn lasix but has not needed   6. Dyspnea - had CPX 06/2014, showed mild to mod reduced functoinal capacity       7. IgG kappa monoclonal gammopathy and bony lesion - both followed by heme/onc     8.Hyperlipidemia 07/2022 TC 181 TG 170 HDL 43 LDL 104     9. DM2 - glycemic control per pcp   10.Strep throat diagnosed 01/23/23      SH: works at [employer] for kids. She has 3 adopted kids [phone]). 3 great grand babies  Father just passed away 2022-08-03Husband just recently passed 12/2022.  2 great grand children live with her   Past Medical History:  Diagnosis Date   Arthritis    Atypical chest pain    chronic   Cancer (HCC)    bone cancer   Chronic back pain    Chronic diastolic CHF (congestive heart failure) (HCC)    COPD (chronic obstructive pulmonary disease) (HCC)    DDD (degenerative disc disease), cervical    Diabetes mellitus without complication (HCC)    History of radiation therapy 07/16/19-08/22/19   L4 spine ; Dr. Antony Blackbird   Hyperlipidemia    Hypertension    Lumbar radiculopathy    Meningitis     Normal coronary arteries 2014   Palpitations    Premature atrial contractions    PVC's (premature ventricular contractions)      Allergies  Allergen Reactions   Other Swelling    Avon lipstick     Current Outpatient Medications  Medication Sig Dispense Refill   albuterol (PROVENTIL) (2.5 MG/3ML) 0.083% nebulizer solution Take 2.5 mg by nebulization every 6 (six) hours as needed for wheezing or shortness of breath.     albuterol (VENTOLIN HFA) 108 (90 Base) MCG/ACT inhaler Inhale 1-2 puffs into the lungs every 4 (four) hours as needed for wheezing or shortness of breath. 18 g 0   amoxicillin (AMOXIL) 500 MG capsule Take 1 capsule (500 mg total) by mouth 2 (two) times daily for 10 days. 20 capsule 0   cetirizine (ZYRTEC) 10 MG tablet Take 1 tablet (10 mg total) by mouth daily. 30 tablet 0   chlorthalidone (HYGROTON) 50 MG tablet Take 1 tablet (50 mg total) by mouth daily. 90 tablet 3   diltiazem (CARDIZEM) 60 MG tablet Take 1 tablet (60 mg total) by mouth 2 (two) times daily. (MAY TAKE AN ADDITIONAL TAB AS NEEDED FOR PALPITATIONS) 180 tablet 3  furosemide (LASIX) 20 MG tablet Take 1 tablet daily as needed may take 2 tablets prn 90 tablet 3   HYDROcodone-acetaminophen (NORCO) 10-325 MG tablet Take 1 tablet by mouth every 6 (six) hours as needed for moderate pain. 30 tablet 0   ibuprofen (ADVIL) 800 MG tablet Take 1 tablet (800 mg total) by mouth every 8 (eight) hours as needed. 90 tablet 1   Insulin Glargine (BASAGLAR KWIKPEN) 100 UNIT/ML Inject 15 Units into the skin at bedtime as needed (blood sugar over 150). (Patient not taking: Reported on 08/12/2022)     isosorbide mononitrate (IMDUR) 30 MG 24 hr tablet Take 1 tablet (30 mg total) by mouth daily. 90 tablet 3   JARDIANCE 10 MG TABS tablet Take 10 mg by mouth daily.     lidocaine (LIDODERM) 5 % Place 2 patches onto the skin daily. Remove & Discard patch within 12 hours or as directed by MD (Patient taking differently: Place 2 patches  onto the skin daily as needed (pain). Remove & Discard patch within 12 hours or as directed by MD) 60 patch 1   losartan (COZAAR) 100 MG tablet Take 1 tablet (100 mg total) by mouth daily. 90 tablet 1   magnesium oxide (MAG-OX) 400 (240 Mg) MG tablet Take 1 tablet (400 mg total) by mouth 2 (two) times daily for 5 days, THEN 1 tablet (400 mg total) daily. 95 tablet 1   metFORMIN (GLUCOPHAGE) 500 MG tablet Take 500 mg by mouth 2 (two) times daily. (Patient not taking: Reported on 08/12/2022)     metoprolol succinate (TOPROL-XL) 50 MG 24 hr tablet Take 1 tablet (50 mg total) by mouth daily. 90 tablet 3   Multiple Vitamin (MULTIVITAMIN WITH MINERALS) TABS tablet Take 1 tablet by mouth daily.     nitroGLYCERIN (NITROSTAT) 0.4 MG SL tablet Place 0.4 mg under the tongue every 5 (five) minutes as needed for chest pain.     oxyCODONE (ROXICODONE) 5 MG immediate release tablet Take 1 tablet (5 mg total) by mouth 2 (two) times daily as needed for severe pain or breakthrough pain. 45 tablet 0   potassium chloride SA (KLOR-CON M) 20 MEQ tablet Take 1 tablet (20 mEq total) by mouth daily. 90 tablet 3   potassium chloride SA (KLOR-CON M) 20 MEQ tablet Take 2 tablets (40 mEq total) by mouth 2 (two) times daily for 2 days. 8 tablet 0   rosuvastatin (CRESTOR) 20 MG tablet Take 1 tablet (20 mg total) by mouth daily. 30 tablet 6   TURMERIC PO Take 600 mg by mouth daily.     No current facility-administered medications for this visit.     Past Surgical History:  Procedure Laterality Date   ABDOMINAL HYSTERECTOMY     BREAST LUMPECTOMY WITH RADIOFREQUENCY TAG IDENTIFICATION Right 01/25/2021   Procedure: BREAST LUMPECTOMY WITH RADIOFREQUENCY TAG IDENTIFICATION;  Surgeon: Lucretia Roers, MD;  Location: [facility];  Service: General;  Laterality: Right;   EXCISION OF BREAST BIOPSY Right 01/25/2021   Procedure: EXCISION OF BREAST BIOPSY;  Surgeon: Lucretia Roers, MD;  Location: [facility];  Service: General;  Laterality:  Right;   IR FLUORO GUIDED NEEDLE PLC ASPIRATION/INJECTION LOC  06/19/2019   LEFT HEART CATHETERIZATION WITH CORONARY ANGIOGRAM N/A 09/05/2012   Procedure: LEFT HEART CATHETERIZATION WITH CORONARY ANGIOGRAM;  Surgeon: Iran Ouch, MD;  Location: [facility];  Service: Cardiovascular;  Laterality: N/A;   RESECTION DISTAL CLAVICAL Left 07/03/2020   Procedure: RESECTION DISTAL CLAVICAL;  Surgeon: Fuller Canada  E, MD;  Location: [facility];  Service: Orthopedics;  Laterality: Left;   SHOULDER OPEN ROTATOR CUFF REPAIR Left 07/03/2020   Procedure: ROTATOR CUFF REPAIR SHOULDER OPEN WITH CHROMEOPLASTY;  Surgeon: Vickki Hearing, MD;  Location: [facility];  Service: Orthopedics;  Laterality: Left;   SHOULDER OPEN ROTATOR CUFF REPAIR Left 07/02/2021   Procedure: ROTATOR CUFF REPAIR SHOULDER OPEN with Patch Graft;  Surgeon: Vickki Hearing, MD;  Location: [facility];  Service: Orthopedics;  Laterality: Left;   THYROID SURGERY  2002     Allergies  Allergen Reactions   Other Swelling    Avon lipstick      Family History  Problem Relation Age of Onset   Heart attack Mother 79   Heart attack Sister 10   Breast cancer Maternal Grandmother    Breast cancer Cousin      Social History Hannah Cooper reports that she has never smoked. She has never been exposed to tobacco smoke. She has never used smokeless tobacco. Hannah Cooper reports no history of alcohol use.   Review of Systems CONSTITUTIONAL: No weight loss, fever, chills, weakness or fatigue.  HEENT: Eyes: No visual loss, blurred vision, double vision or yellow sclerae.No hearing loss, sneezing, congestion, runny nose or sore throat.  SKIN: No rash or itching.  CARDIOVASCULAR: per hpi RESPIRATORY: No shortness of breath, cough or sputum.  GASTROINTESTINAL: No anorexia, nausea, vomiting or diarrhea. No abdominal pain or blood.  GENITOURINARY: No burning on urination, no polyuria NEUROLOGICAL: No headache, dizziness, syncope, paralysis, ataxia,  numbness or tingling in the extremities. No change in bowel or bladder control.  MUSCULOSKELETAL: No muscle, back pain, joint pain or stiffness.  LYMPHATICS: No enlarged nodes. No history of splenectomy.  PSYCHIATRIC: No history of depression or anxiety.  ENDOCRINOLOGIC: No reports of sweating, cold or heat intolerance. No polyuria or polydipsia.  Marland Kitchen   Physical Examination Today's Vitals   01/27/23 0847  BP: 130/80  Pulse: (!) 46  SpO2: 94%  Weight: 215 lb (97.5 kg)  Height: 5\' 5"  (1.651 m)   Body mass index is 35.78 kg/m.  Gen: resting comfortably, no acute distress HEENT: no scleral icterus, pupils equal round and reactive, no palptable cervical adenopathy,  CV: regular, brady 48, no m/rg, no jvd Resp: Clear to auscultation bilaterally GI: abdomen is soft, non-tender, non-distended, normal bowel sounds, no hepatosplenomegaly MSK: extremities are warm, no edema.  Skin: warm, no rash Neuro:  no focal deficits Psych: appropriate affect   Diagnostic Studies  08/2012 cath Hemodynamics: AO:  164/82   mmHg LV:  169/14    mmHg LVEDP: 24  mmHg   Coronary angiography: Coronary dominance: Right    Left Main:  Normal Left Anterior Descending (LAD):  Normal in size with no significant disease. 1st diagonal (D1):  Small in size with minor irregularities. 2nd diagonal (D2):  Normal in size with no significant disease. 3rd diagonal (D3):  Normal in size with no significant disease. Circumflex (LCx):  Normal in size and nondominant. The vessel has no significant disease. 1st obtuse marginal:  Small in size with no significant disease. 2nd obtuse marginal:  Small in size with no significant disease.  3rd obtuse marginal:  Large in size with no significant disease.   Right Coronary Artery: Normal in size and dominant. The vessel has no significant disease. posterior descending artery: Normal in size with no significant disease. posterior lateral branchs:  Normal in size with no  significant disease.   Left ventriculography: Left ventricular systolic function  is normal , LVEF is estimated at 60-65% %, there is no significant mitral regurgitation    Final Conclusions:    1. Normal coronary arteries. 2. Normal LV systolic function. 3. Moderately elevated left ventricular end-diastolic pressure likely due to diastolic heart failure.     06/2013 echo Study Conclusions  - Procedure narrative: Transthoracic echocardiography. Image   quality was suboptimal. The study was technically   difficult, as a result of poor sound wave transmission and   restricted patient mobility. - Left ventricle: The cavity size was normal. Wall thickness   was increased in a pattern of mild to moderate LVH.   Diastolic dysfunction noted, grade indeterminant. Systolic   function was normal. The estimated ejection fraction was   in the range of 55% to 60%. Wall motion was normal; there   were no regional wall motion abnormalities.     Jan 2017 Event monitor: no arrhythmias   06/2014 CPX Conclusion: Exercise testing with gas exchange demonstrates a mild-moderately reduced functional capacity when compared to matched sedentary norms. There was an in-adequate HR response to the exercise response and flat O2 pulse. This high HR reserve was likely high due to pulmonary limitations based on restrictive spirometry and RR 45 at peak exercise and reaching maximum ventilatory limits. However, it could have easily been due to poor effort. The sub-maximal effort is clouding interpretation of the test. Nevertheless, correlation of resting spirometry test  Showing restriction with outpatient pulmonary function test is warranted     05/2016 heart monitor Telemetry tracings predominately show sinus rhythm Reported symptoms correlate with occasional PVCs and PACs       02/2016 echo Study Conclusions   - Left ventricle: The cavity size was normal. Systolic function was   vigorous. The  estimated ejection fraction was in the range of 65%   to 70%. Wall motion was normal; there were no regional wall   motion abnormalities. There was an increased relative   contribution of atrial contraction to ventricular filling.   Doppler parameters are consistent with abnormal left ventricular   relaxation (grade 1 diastolic dysfunction). - Pulmonic valve: There was trivial regurgitation. - Pulmonary arteries: Systolic pressure could not be accurately   estimated.     Assessment and Plan   1. Palpitations - mild symptoms with recent stress of her husband passing, starting to resolved - low HRs today, will lower diltiazem to 30mg  bid may take additional 30mg  prn - EKG today shows sinus bradycardia at 49     2.HTN - bp at goal, continue current meds   3. Chronic diastolic HF -euvoelmic today - low K on last labs, will restart her KCl daily. Check bmet/mg 1 week    4 Hyperlipidemia - above goal last visit, we at that time started crestor 20mg  daily - repeat lipid panel     Antoine Poche, M.D.

## 2023-01-27 NOTE — Patient Instructions (Signed)
Medication Instructions:  Your physician has recommended you make the following change in your medication:  Decrease your Diltiazem to 30 mg twice daily, may take additional 30 mg as needed for palpitations  Continue taking all other medications as prescribed  Labwork: In one week Fasting Lipids, BMET and Magnesium to be done at [facility]  Testing/Procedures: None  Follow-Up: Your physician recommends that you schedule a follow-up appointment in: 6 months  Any Other Special Instructions Will Be Listed Below (If Applicable).  If you need a refill on your cardiac medications before your next appointment, please call your pharmacy.

## 2023-02-06 DIAGNOSIS — I509 Heart failure, unspecified: Secondary | ICD-10-CM | POA: Diagnosis not present

## 2023-02-06 DIAGNOSIS — Z6835 Body mass index (BMI) 35.0-35.9, adult: Secondary | ICD-10-CM | POA: Diagnosis not present

## 2023-02-06 DIAGNOSIS — Z008 Encounter for other general examination: Secondary | ICD-10-CM | POA: Diagnosis not present

## 2023-02-06 DIAGNOSIS — E261 Secondary hyperaldosteronism: Secondary | ICD-10-CM | POA: Diagnosis not present

## 2023-02-06 DIAGNOSIS — E785 Hyperlipidemia, unspecified: Secondary | ICD-10-CM | POA: Diagnosis not present

## 2023-02-06 DIAGNOSIS — I209 Angina pectoris, unspecified: Secondary | ICD-10-CM | POA: Diagnosis not present

## 2023-02-06 DIAGNOSIS — E1169 Type 2 diabetes mellitus with other specified complication: Secondary | ICD-10-CM | POA: Diagnosis not present

## 2023-02-06 DIAGNOSIS — C768 Malignant neoplasm of other specified ill-defined sites: Secondary | ICD-10-CM | POA: Diagnosis not present

## 2023-02-06 DIAGNOSIS — E1142 Type 2 diabetes mellitus with diabetic polyneuropathy: Secondary | ICD-10-CM | POA: Diagnosis not present

## 2023-02-06 DIAGNOSIS — J449 Chronic obstructive pulmonary disease, unspecified: Secondary | ICD-10-CM | POA: Diagnosis not present

## 2023-02-06 DIAGNOSIS — I110 Hypertensive heart disease with heart failure: Secondary | ICD-10-CM | POA: Diagnosis not present

## 2023-02-07 ENCOUNTER — Other Ambulatory Visit (HOSPITAL_COMMUNITY)
Admission: RE | Admit: 2023-02-07 | Discharge: 2023-02-07 | Disposition: A | Discharge status: Home / Self Care | Payer: No Typology Code available for payment source | Source: Ambulatory Visit | Attending: Cardiology | Admitting: Cardiology

## 2023-02-07 DIAGNOSIS — E782 Mixed hyperlipidemia: Secondary | ICD-10-CM | POA: Diagnosis not present

## 2023-02-07 DIAGNOSIS — Z79899 Other long term (current) drug therapy: Secondary | ICD-10-CM | POA: Diagnosis not present

## 2023-02-07 DIAGNOSIS — I5032 Chronic diastolic (congestive) heart failure: Secondary | ICD-10-CM | POA: Diagnosis not present

## 2023-02-07 LAB — BASIC METABOLIC PANEL
Anion gap: 8 (ref 5–15)
BUN: 21 mg/dL (ref 8–23)
CO2: 29 mmol/L (ref 22–32)
Calcium: 9.4 mg/dL (ref 8.9–10.3)
Chloride: 101 mmol/L (ref 98–111)
Creatinine, Ser: 0.97 mg/dL (ref 0.44–1.00)
GFR, Estimated: >60 mL/min (ref >60)
Glucose, Bld: 131 mg/dL — ABNORMAL HIGH (ref 70–99)
Potassium: 4.2 mmol/L (ref 3.5–5.1)
Sodium: 138 mmol/L (ref 135–145)

## 2023-02-07 LAB — LIPID PANEL
Cholesterol: 90 mg/dL (ref 0–200)
HDL: 42 mg/dL (ref >40)
LDL Cholesterol: 37 mg/dL (ref 0–99)
Total CHOL/HDL Ratio: 2.1 {ratio}
Triglycerides: 55 mg/dL (ref <150)
VLDL: 11 mg/dL (ref 0–40)

## 2023-02-07 LAB — MAGNESIUM: Magnesium: 2 mg/dL (ref 1.7–2.4)

## 2023-02-14 ENCOUNTER — Ambulatory Visit (HOSPITAL_COMMUNITY)
Admission: RE | Admit: 2023-02-14 | Discharge: 2023-02-14 | Disposition: A | Discharge status: Home / Self Care | Payer: No Typology Code available for payment source | Source: Ambulatory Visit | Attending: Hematology | Admitting: Hematology

## 2023-02-14 DIAGNOSIS — D472 Monoclonal gammopathy: Secondary | ICD-10-CM | POA: Insufficient documentation

## 2023-02-14 DIAGNOSIS — R937 Abnormal findings on diagnostic imaging of other parts of musculoskeletal system: Secondary | ICD-10-CM | POA: Diagnosis not present

## 2023-02-14 DIAGNOSIS — M19019 Primary osteoarthritis, unspecified shoulder: Secondary | ICD-10-CM | POA: Diagnosis not present

## 2023-02-14 DIAGNOSIS — C9030 Solitary plasmacytoma not having achieved remission: Secondary | ICD-10-CM | POA: Insufficient documentation

## 2023-02-15 NOTE — Progress Notes (Signed)
[facility] [address] [place] [address]    Clinic Day:  02/16/2023  Referring physician: Avis Epley, PA*  Patient Care Team: Ladon Applebaum as PCP - General (Family Medicine) Wyline Mood Dorothe Pea, MD as PCP - Cardiology (Cardiology) Jodelle Gross, NP as Nurse Practitioner (Nurse Practitioner) Mickie Bail, RN as Oncology Nurse Navigator (Oncology) Doreatha Massed, MD as Medical Oncologist (Oncology)   ASSESSMENT & PLAN:   Assessment: 1.  Plasmacytoma of L4 vertebral body: -MRI lumbar spine shows L4 lesion. -PET scan on 06/10/2019 shows mildly increased FDG uptake associated with mixed lytic/sclerotic lesion involving L4 vertebral body SUV 4.4. -Serum immunofixation shows IgG kappa monoclonal protein.  SPEP-no M spike.  Kappa light chains elevated at 25.3.  Ratio 1.12.  Lambda light chains 21.9.  Beta-2 microglobulin 2.2. -24-hour urine was negative for UPEP and immunofixation.  Protein was 130 mg. -L4 needle biopsy on 06/19/2019 showed minute segments of the bone and soft tissue, limited cellularity.  IHC for CD138 highlights scattering plasma cells.  Cytokeratin is negative.  Few kappa positive plasma cells present.  Overall material is very limited and essentially nondiagnostic. -Clinically this is consistent with plasmacytoma.  We reviewed bone marrow biopsy results which showed normocellular marrow.  Cytogenetics are normal. -XRT to L4 vertebral body from 07/16/2019 through 08/22/2019. -MRI of the lumbar spine on 12/03/2019 showed unchanged size of the L4 vertebral body lesion, slightly decreased contrast-enhancement with no new lesions.   2.  Low back pain: -She is taking half tablet of hydrocodone 10/325 every 6 hours as needed which is helping. -Likely this will improve upon completion of radiation.  3.  Right breast intraductal papilloma: - Biopsy on 12/01/2020 at 3 o'clock position of the right breast with intraductal  papilloma with florid usual ductal hyperplasia and sclerosing fibrosis. - Right breast lumpectomy on 01/25/2021 with intraductal papilloma with florid UDH and calcifications 1.2 cm.  Margins negative.  Plan: 1.  Plasmacytoma of L4 vertebral body: - Her back pain is stable. - Reviewed labs from 12/16/2022: Normal LFTs.  CBC shows mild leukopenia and mild anemia stable.  SPEP did not show M spike.  Free light chain ratio is normal with mildly elevated kappa light chains stable at 36.  Immunofixation shows polyclonal gammopathy. - Skeletal survey from today is pending. - RTC 6 months for follow-up with repeat labs.   2.  Low back pain: - Continue oxycodone 5 mg daily as needed.  I have given prescription refill today #60.   3.  Right breast intraductal papilloma and florid UDH: - She has not had recent mammograms.  I have recommended that she have a mammogram done.  Orders Placed This Encounter  Procedures   CBC with Differential    Standing Status:   Future    Standing Expiration Date:   02/16/2024   Comprehensive metabolic panel    Standing Status:   Future    Standing Expiration Date:   02/16/2024   Kappa/lambda light chains    Standing Status:   Future    Standing Expiration Date:   02/16/2024   Immunofixation electrophoresis    Standing Status:   Future    Standing Expiration Date:   02/16/2024   Protein electrophoresis, serum    Standing Status:   Future    Standing Expiration Date:   02/16/2024      Alben Deeds Teague,acting as a scribe for Doreatha Massed, MD.,have documented all relevant documentation on the behalf  of Doreatha Massed, MD,as directed by  Doreatha Massed, MD while in the presence of Doreatha Massed, MD.  I, Doreatha Massed MD, have reviewed the above documentation for accuracy and completeness, and I agree with the above.    Doreatha Massed, MD   10/31/20246:17 PM  CHIEF COMPLAINT:   Diagnosis: L4 plasmacytoma   Cancer  Staging  No matching staging information was found for the patient.    Prior Therapy: XRT in 27 fractions from 07/17/2019 to 08/22/2019   Current Therapy:  surveillance   HISTORY OF PRESENT ILLNESS:   Oncology History   No history exists.     INTERVAL HISTORY:   Hannah Cooper is a 67 y.o. female presenting to clinic today for follow up of L4 plasmacytoma. She was last seen by me on 06/20/22.  Since her last visit, she presented to the ED on 01/23/23 for Streptococcal sore throat, treated with amoxicillin 500 mg BID x 10 days. She also had a DEXA scan done on 02/14/23.   Today, she states that she is doing well overall. Her appetite level is at 75%. Her energy level is at 60%.   Back and leg pain is stable to mildly worsened from last visit. Pain will occasionally feel like pins and needles. She attributes this to sciatic nerve pain and will discuss this with Dr. Doreatha Martin. She denies any new pain or infections in the last 6 months. She is taking oxycodone prn. She reports intermittent skin rashes and is unsure if this is due to certain medications. She had a mammogram done 2 years ago at a mobile clinic. Her husband recently passed on Jan 15, 2023.   PAST MEDICAL HISTORY:   Past Medical History: Past Medical History:  Diagnosis Date   Arthritis    Atypical chest pain    chronic   Cancer (HCC)    bone cancer   Chronic back pain    Chronic diastolic CHF (congestive heart failure) (HCC)    COPD (chronic obstructive pulmonary disease) (HCC)    DDD (degenerative disc disease), cervical    Diabetes mellitus without complication (HCC)    History of radiation therapy 07/16/19-08/22/19   L4 spine ; Dr. Antony Blackbird   Hyperlipidemia    Hypertension    Lumbar radiculopathy    Meningitis    Normal coronary arteries 2014   Palpitations    Premature atrial contractions    PVC's (premature ventricular contractions)     Surgical History: Past Surgical History:  Procedure Laterality Date   ABDOMINAL  HYSTERECTOMY     BREAST LUMPECTOMY WITH RADIOFREQUENCY TAG IDENTIFICATION Right 01/25/2021   Procedure: BREAST LUMPECTOMY WITH RADIOFREQUENCY TAG IDENTIFICATION;  Surgeon: Lucretia Roers, MD;  Location: [facility];  Service: General;  Laterality: Right;   EXCISION OF BREAST BIOPSY Right 01/25/2021   Procedure: EXCISION OF BREAST BIOPSY;  Surgeon: Lucretia Roers, MD;  Location: [facility];  Service: General;  Laterality: Right;   IR FLUORO GUIDED NEEDLE PLC ASPIRATION/INJECTION LOC  06/19/2019   LEFT HEART CATHETERIZATION WITH CORONARY ANGIOGRAM N/A 09/05/2012   Procedure: LEFT HEART CATHETERIZATION WITH CORONARY ANGIOGRAM;  Surgeon: Iran Ouch, MD;  Location: [facility];  Service: Cardiovascular;  Laterality: N/A;   RESECTION DISTAL CLAVICAL Left 07/03/2020   Procedure: RESECTION DISTAL CLAVICAL;  Surgeon: Vickki Hearing, MD;  Location: [facility];  Service: Orthopedics;  Laterality: Left;   SHOULDER OPEN ROTATOR CUFF REPAIR Left 07/03/2020   Procedure: ROTATOR CUFF REPAIR SHOULDER OPEN WITH CHROMEOPLASTY;  Surgeon: Vickki Hearing, MD;  Location: [facility];  Service: Orthopedics;  Laterality: Left;   SHOULDER OPEN ROTATOR CUFF REPAIR Left 07/02/2021   Procedure: ROTATOR CUFF REPAIR SHOULDER OPEN with Patch Graft;  Surgeon: Vickki Hearing, MD;  Location: [facility];  Service: Orthopedics;  Laterality: Left;   THYROID SURGERY  2002    Social History: Social History   Socioeconomic History   Marital status: Married    Spouse name: Not on file   Number of children: Not on file   Years of education: Not on file   Highest education level: Not on file  Occupational History   Not on file  Tobacco Use   Smoking status: Never    Passive exposure: Never   Smokeless tobacco: Never  Vaping Use   Vaping status: Never Used  Substance and Sexual Activity   Alcohol use: No    Alcohol/week: 0.0 standard drinks of alcohol   Drug use: No   Sexual activity: Yes    Partners: Male  Other Topics  Concern   Not on file  Social History Narrative   Not on file   Social Determinants of Health   Financial Resource Strain: Low Risk  (05/28/2019)   Overall Financial Resource Strain (CARDIA)    Difficulty of Paying Living Expenses: Not hard at all  Food Insecurity: No Food Insecurity (05/28/2019)   Hunger Vital Sign    Worried About Running Out of Food in the Last Year: Never true    Ran Out of Food in the Last Year: Never true  Transportation Needs: No Transportation Needs (05/28/2019)   PRAPARE - Administrator, Civil Service (Medical): No    Lack of Transportation (Non-Medical): No  Physical Activity: Inactive (05/28/2019)   Exercise Vital Sign    Days of Exercise per Week: 0 days    Minutes of Exercise per Session: 0 min  Stress: Stress Concern Present (05/28/2019)   [employer] of Occupational Health - Occupational Stress Questionnaire    Feeling of Stress : To some extent  Social Connections: Unknown (08/31/2021)   Received from [facility], [facility]   Social Network    Social Network: Not on file  Intimate Partner Violence: Unknown (07/23/2021)   Received from [facility], [facility]   HITS    Physically Hurt: Not on file    Insult or Talk Down To: Not on file    Threaten Physical Harm: Not on file    Scream or Curse: Not on file    Family History: Family History  Problem Relation Age of Onset   Heart attack Mother 95   Heart attack Sister 23   Breast cancer Maternal Grandmother    Breast cancer Cousin     Current Medications:  Current Outpatient Medications:    albuterol (PROVENTIL) (2.5 MG/3ML) 0.083% nebulizer solution, Take 2.5 mg by nebulization every 6 (six) hours as needed for wheezing or shortness of breath., Disp: , Rfl:    albuterol (VENTOLIN HFA) 108 (90 Base) MCG/ACT inhaler, Inhale 2 puffs into the lungs every 4 (four) hours as needed for wheezing or shortness of breath., Disp: , Rfl:    cetirizine (ZYRTEC) 10 MG tablet, Take 10  mg by mouth daily as needed for allergies., Disp: , Rfl:    chlorthalidone (HYGROTON) 50 MG tablet, Take 1 tablet (50 mg total) by mouth daily., Disp: 90 tablet, Rfl: 3   Continuous Glucose Sensor (FREESTYLE LIBRE 2 SENSOR) MISC, APPLY 1 SENSOR EVERY 14 DAYS, Disp: , Rfl:  diltiazem (CARDIZEM) 30 MG tablet, Take 1 tablet (30 mg total) by mouth 2 (two) times daily. May take additional 30 mg as needed for palpitations, Disp: 180 tablet, Rfl: 1   furosemide (LASIX) 20 MG tablet, Take 1 tablet daily as needed may take 2 tablets prn, Disp: 90 tablet, Rfl: 3   HYDROcodone-acetaminophen (NORCO) 10-325 MG tablet, Take 1 tablet by mouth every 6 (six) hours as needed for moderate pain., Disp: 30 tablet, Rfl: 0   ibuprofen (ADVIL) 800 MG tablet, Take 1 tablet (800 mg total) by mouth every 8 (eight) hours as needed., Disp: 90 tablet, Rfl: 1   isosorbide mononitrate (IMDUR) 30 MG 24 hr tablet, Take 1 tablet (30 mg total) by mouth daily., Disp: 90 tablet, Rfl: 3   JARDIANCE 10 MG TABS tablet, Take 10 mg by mouth daily., Disp: , Rfl:    lidocaine (LIDODERM) 5 %, Place 2 patches onto the skin daily as needed. Remove & Discard patch within 12 hours or as directed by MD, Disp: , Rfl:    losartan (COZAAR) 100 MG tablet, Take 1 tablet (100 mg total) by mouth daily., Disp: 90 tablet, Rfl: 1   metoprolol succinate (TOPROL-XL) 50 MG 24 hr tablet, Take 1 tablet (50 mg total) by mouth daily., Disp: 90 tablet, Rfl: 3   Multiple Vitamin (MULTIVITAMIN WITH MINERALS) TABS tablet, Take 1 tablet by mouth daily., Disp: , Rfl:    nitroGLYCERIN (NITROSTAT) 0.4 MG SL tablet, Place 1 tablet (0.4 mg total) under the tongue every 5 (five) minutes x 3 doses as needed for chest pain (if no relief after 2nd dose, proceed to the ED or call 911)., Disp: 25 tablet, Rfl: 2   potassium chloride SA (KLOR-CON M) 20 MEQ tablet, Take 1 tablet (20 mEq total) by mouth daily., Disp: 90 tablet, Rfl: 3   rosuvastatin (CRESTOR) 20 MG tablet, Take 1  tablet (20 mg total) by mouth daily., Disp: 30 tablet, Rfl: 6   TURMERIC PO, Take 600 mg by mouth daily., Disp: , Rfl:    oxyCODONE (ROXICODONE) 5 MG immediate release tablet, Take 1 tablet (5 mg total) by mouth 2 (two) times daily as needed for severe pain (pain score 7-10) or breakthrough pain., Disp: 60 tablet, Rfl: 0   Allergies: Allergies  Allergen Reactions   Other Swelling    [place]on lipstick    REVIEW OF SYSTEMS:   Review of Systems  Constitutional:  Negative for chills, fatigue and fever.  HENT:   Negative for lump/mass, mouth sores, nosebleeds, sore throat and trouble swallowing.   Eyes:  Negative for eye problems.  Respiratory:  Negative for cough and shortness of breath.   Cardiovascular:  Positive for chest pain. Negative for leg swelling and palpitations.  Gastrointestinal:  Negative for abdominal pain, constipation, diarrhea, nausea and vomiting.  Genitourinary:  Negative for bladder incontinence, difficulty urinating, dysuria, frequency, hematuria and nocturia.   Musculoskeletal:  Positive for back pain (8/10 severity). Negative for arthralgias, flank pain, myalgias and neck pain.       +left leg pain, 8/10 severity  Skin:  Negative for itching and rash.  Neurological:  Negative for dizziness, headaches and numbness.  Hematological:  Does not bruise/bleed easily.  Psychiatric/Behavioral:  Negative for depression, sleep disturbance and suicidal ideas. The patient is not nervous/anxious.   All other systems reviewed and are negative.    VITALS:   Blood pressure 98/68, pulse (!) 55, temperature 98.1 F (36.7 C), temperature source Oral, resp. rate 16, weight 208 lb  12.8 oz (94.7 kg), SpO2 99%.  Wt Readings from Last 3 Encounters:  02/16/23 208 lb 12.8 oz (94.7 kg)  01/27/23 215 lb (97.5 kg)  08/12/22 215 lb 9.6 oz (97.8 kg)    Body mass index is 34.75 kg/m.  Performance status (ECOG): 1 - Symptomatic but completely ambulatory  PHYSICAL EXAM:   Physical  Exam Vitals and nursing note reviewed. Exam conducted with a chaperone present.  Constitutional:      Appearance: Normal appearance.  Cardiovascular:     Rate and Rhythm: Normal rate and regular rhythm.     Pulses: Normal pulses.     Heart sounds: Normal heart sounds.  Pulmonary:     Effort: Pulmonary effort is normal.     Breath sounds: Normal breath sounds.  Abdominal:     Palpations: Abdomen is soft. There is no hepatomegaly, splenomegaly or mass.     Tenderness: There is no abdominal tenderness.  Musculoskeletal:     Right lower leg: No edema.     Left lower leg: No edema.  Lymphadenopathy:     Cervical: No cervical adenopathy.     Right cervical: No superficial, deep or posterior cervical adenopathy.    Left cervical: No superficial, deep or posterior cervical adenopathy.     Upper Body:     Right upper body: No supraclavicular or axillary adenopathy.     Left upper body: No supraclavicular or axillary adenopathy.  Neurological:     General: No focal deficit present.     Mental Status: She is alert and oriented to person, place, and time.  Psychiatric:        Mood and Affect: Mood normal.        Behavior: Behavior normal.     LABS:      Latest Ref Rng & Units 12/16/2022   12:52 PM 08/05/2022   12:22 PM 06/13/2022    9:10 AM  CBC  WBC 4.0 - 10.5 K/uL 3.4  3.5  3.9   Hemoglobin 12.0 - 15.0 g/dL 52.8  41.3  24.4   Hematocrit 36.0 - 46.0 % 34.5  38.3  35.1   Platelets 150 - 400 K/uL 253  295  308       Latest Ref Rng & Units 02/07/2023    9:02 AM 12/16/2022   12:52 PM 09/07/2022    9:51 AM  CMP  Glucose 70 - 99 mg/dL 010  272  536   BUN 8 - 23 mg/dL 21  15  16    Creatinine 0.44 - 1.00 mg/dL 6.44  0.34  7.42   Sodium 135 - 145 mmol/L 138  139  140   Potassium 3.5 - 5.1 mmol/L 4.2  3.3  4.0   Chloride 98 - 111 mmol/L 101  102  104   CO2 22 - 32 mmol/L 29  29  27    Calcium 8.9 - 10.3 mg/dL 9.4  9.2  9.1   Total Protein 6.5 - 8.1 g/dL  7.6    Total Bilirubin 0.3 -  1.2 mg/dL  0.6    Alkaline Phos 38 - 126 U/L  67    AST 15 - 41 U/L  25    ALT 0 - 44 U/L  32       No results found for: "CEA1", "CEA" / No results found for: "CEA1", "CEA" No results found for: "PSA1" No results found for: "VZD638" No results found for: "VFI433"  Lab Results  Component Value Date   TOTALPROTELP 7.2 12/16/2022  ALBUMINELP 3.8 12/16/2022   A1GS 0.2 12/16/2022   A2GS 0.6 12/16/2022   BETS 1.0 12/16/2022   GAMS 1.6 12/16/2022   MSPIKE Not Observed 12/16/2022   SPEI Comment 12/16/2022   Lab Results  Component Value Date   FERRITIN 346 (H) 11/13/2019   Lab Results  Component Value Date   LDH 158 11/09/2021   LDH 159 05/03/2021   LDH 144 01/27/2021     STUDIES:   No results found.

## 2023-02-16 ENCOUNTER — Inpatient Hospital Stay: Payer: No Typology Code available for payment source | Attending: Hematology | Admitting: Hematology

## 2023-02-16 ENCOUNTER — Other Ambulatory Visit: Payer: Self-pay | Admitting: *Deleted

## 2023-02-16 VITALS — BP 98/68 | HR 55 | Temp 98.1°F | Resp 16 | Wt 208.8 lb

## 2023-02-16 DIAGNOSIS — D241 Benign neoplasm of right breast: Secondary | ICD-10-CM | POA: Diagnosis not present

## 2023-02-16 DIAGNOSIS — Z79899 Other long term (current) drug therapy: Secondary | ICD-10-CM | POA: Diagnosis not present

## 2023-02-16 DIAGNOSIS — C9030 Solitary plasmacytoma not having achieved remission: Secondary | ICD-10-CM | POA: Insufficient documentation

## 2023-02-16 DIAGNOSIS — D472 Monoclonal gammopathy: Secondary | ICD-10-CM | POA: Diagnosis not present

## 2023-02-16 MED ORDER — OXYCODONE HCL 5 MG PO TABS: 5.0000 mg | ORAL_TABLET | Freq: Two times a day (BID) | ORAL | 0 refills | Status: DC | PRN

## 2023-02-16 NOTE — Patient Instructions (Addendum)
[facility] at [facility] Discharge Instructions   You were seen and examined today by Dr. Ellin Saba.  He reviewed the results of your lab work which are normal/stable.   The results of your skeletal survey are pending.  We will see you back in 6 months. We will repeat lab work prior to this visit.   Return as scheduled.    Thank you for choosing [facility] at [facility] to provide your oncology and hematology care.  To afford each patient quality time with our provider, please arrive at least 15 minutes before your scheduled appointment time.   If you have a lab appointment with the Cancer Center please come in thru the Main Entrance and check in at the main information desk.  You need to re-schedule your appointment should you arrive 10 or more minutes late.  We strive to give you quality time with our providers, and arriving late affects you and other patients whose appointments are after yours.  Also, if you no show three or more times for appointments you may be dismissed from the clinic at the providers discretion.     Again, thank you for choosing [facility].  Our hope is that these requests will decrease the amount of time that you wait before being seen by our physicians.       _____________________________________________________________  Should you have questions after your visit to [facility], please contact our office at [phone] and follow the prompts.  Our office hours are 8:00 a.m. and 4:30 p.m. Monday - Friday.  Please note that voicemails left after 4:00 p.m. may not be returned until the following business day.  We are closed weekends and major holidays.  You do have access to a nurse 24-7, just call the main number to the clinic [phone] and do not press any options, hold on the line and a nurse will answer the phone.    For prescription refill requests, have your pharmacy contact our  office and allow 72 hours.    Due to Covid, you will need to wear a mask upon entering the hospital. If you do not have a mask, a mask will be given to you at the Main Entrance upon arrival. For doctor visits, patients may have 1 support person age 41 or older with them. For treatment visits, patients can not have anyone with them due to social distancing guidelines and our immunocompromised population.

## 2023-02-23 ENCOUNTER — Other Ambulatory Visit: Payer: Self-pay | Admitting: Cardiology

## 2023-03-14 ENCOUNTER — Encounter: Payer: Self-pay | Admitting: *Deleted

## 2023-05-13 ENCOUNTER — Other Ambulatory Visit: Payer: Self-pay | Admitting: Cardiology

## 2023-06-02 DIAGNOSIS — E782 Mixed hyperlipidemia: Secondary | ICD-10-CM | POA: Diagnosis not present

## 2023-06-02 DIAGNOSIS — Z206 Contact with and (suspected) exposure to human immunodeficiency virus [HIV]: Secondary | ICD-10-CM | POA: Diagnosis not present

## 2023-06-02 DIAGNOSIS — E11649 Type 2 diabetes mellitus with hypoglycemia without coma: Secondary | ICD-10-CM | POA: Diagnosis not present

## 2023-06-02 DIAGNOSIS — E6609 Other obesity due to excess calories: Secondary | ICD-10-CM | POA: Diagnosis not present

## 2023-06-02 DIAGNOSIS — I10 Essential (primary) hypertension: Secondary | ICD-10-CM | POA: Diagnosis not present

## 2023-06-02 DIAGNOSIS — R21 Rash and other nonspecific skin eruption: Secondary | ICD-10-CM | POA: Diagnosis not present

## 2023-06-02 DIAGNOSIS — F5102 Adjustment insomnia: Secondary | ICD-10-CM | POA: Diagnosis not present

## 2023-06-02 DIAGNOSIS — E1159 Type 2 diabetes mellitus with other circulatory complications: Secondary | ICD-10-CM | POA: Diagnosis not present

## 2023-06-02 DIAGNOSIS — Z6837 Body mass index (BMI) 37.0-37.9, adult: Secondary | ICD-10-CM | POA: Diagnosis not present

## 2023-06-15 ENCOUNTER — Other Ambulatory Visit: Payer: Self-pay | Admitting: *Deleted

## 2023-06-15 DIAGNOSIS — G8918 Other acute postprocedural pain: Secondary | ICD-10-CM

## 2023-06-16 ENCOUNTER — Other Ambulatory Visit: Payer: Self-pay

## 2023-06-16 DIAGNOSIS — C9030 Solitary plasmacytoma not having achieved remission: Secondary | ICD-10-CM

## 2023-06-17 ENCOUNTER — Other Ambulatory Visit: Payer: Self-pay | Admitting: Hematology

## 2023-06-17 DIAGNOSIS — C9030 Solitary plasmacytoma not having achieved remission: Secondary | ICD-10-CM

## 2023-06-17 MED ORDER — OXYCODONE HCL 5 MG PO TABS: 5.0000 mg | ORAL_TABLET | Freq: Two times a day (BID) | ORAL | 0 refills | Status: DC | PRN

## 2023-06-19 ENCOUNTER — Other Ambulatory Visit: Payer: Self-pay | Admitting: *Deleted

## 2023-06-19 ENCOUNTER — Other Ambulatory Visit: Payer: Self-pay | Admitting: Cardiology

## 2023-06-19 ENCOUNTER — Other Ambulatory Visit: Payer: Self-pay

## 2023-06-19 DIAGNOSIS — C9030 Solitary plasmacytoma not having achieved remission: Secondary | ICD-10-CM

## 2023-06-19 NOTE — Telephone Encounter (Signed)
 Please refuse this.  You filled on 3/1

## 2023-06-29 ENCOUNTER — Other Ambulatory Visit (HOSPITAL_COMMUNITY)
Admission: RE | Admit: 2023-06-29 | Discharge: 2023-06-29 | Disposition: A | Discharge status: Home / Self Care | Source: Ambulatory Visit | Attending: Family Medicine | Admitting: Family Medicine

## 2023-06-29 DIAGNOSIS — E11649 Type 2 diabetes mellitus with hypoglycemia without coma: Secondary | ICD-10-CM | POA: Insufficient documentation

## 2023-06-29 LAB — CBC WITH DIFFERENTIAL/PLATELET
Abs Immature Granulocytes: 0.01 10*3/uL (ref 0.00–0.07)
Basophils Absolute: 0 10*3/uL (ref 0.0–0.1)
Basophils Relative: 1 %
Eosinophils Absolute: 0.2 10*3/uL (ref 0.0–0.5)
Eosinophils Relative: 4 %
HCT: 37.6 % (ref 36.0–46.0)
Hemoglobin: 12.6 g/dL (ref 12.0–15.0)
Immature Granulocytes: 0 %
Lymphocytes Relative: 41 %
Lymphs Abs: 1.7 10*3/uL (ref 0.7–4.0)
MCH: 29.4 pg (ref 26.0–34.0)
MCHC: 33.5 g/dL (ref 30.0–36.0)
MCV: 87.9 fL (ref 80.0–100.0)
Monocytes Absolute: 0.5 10*3/uL (ref 0.1–1.0)
Monocytes Relative: 11 %
Neutro Abs: 1.8 10*3/uL (ref 1.7–7.7)
Neutrophils Relative %: 43 %
Platelets: 282 10*3/uL (ref 150–400)
RBC: 4.28 MIL/uL (ref 3.87–5.11)
RDW: 14.5 % (ref 11.5–15.5)
WBC: 4.1 10*3/uL (ref 4.0–10.5)
nRBC: 0 % (ref 0.0–0.2)

## 2023-06-29 LAB — COMPREHENSIVE METABOLIC PANEL
ALT: 28 U/L (ref 0–44)
AST: 26 U/L (ref 15–41)
Albumin: 4.2 g/dL (ref 3.5–5.0)
Alkaline Phosphatase: 65 U/L (ref 38–126)
Anion gap: 11 (ref 5–15)
BUN: 17 mg/dL (ref 8–23)
CO2: 28 mmol/L (ref 22–32)
Calcium: 9.7 mg/dL (ref 8.9–10.3)
Chloride: 99 mmol/L (ref 98–111)
Creatinine, Ser: 0.77 mg/dL (ref 0.44–1.00)
GFR, Estimated: >60 mL/min (ref >60)
Glucose, Bld: 170 mg/dL — ABNORMAL HIGH (ref 70–99)
Potassium: 3.7 mmol/L (ref 3.5–5.1)
Sodium: 138 mmol/L (ref 135–145)
Total Bilirubin: 0.6 mg/dL (ref 0.0–1.2)
Total Protein: 8.2 g/dL — ABNORMAL HIGH (ref 6.5–8.1)

## 2023-06-29 LAB — LIPID PANEL
Cholesterol: 96 mg/dL (ref 0–200)
HDL: 46 mg/dL (ref >40)
LDL Cholesterol: 37 mg/dL (ref 0–99)
Total CHOL/HDL Ratio: 2.1 ratio
Triglycerides: 64 mg/dL (ref <150)
VLDL: 13 mg/dL (ref 0–40)

## 2023-06-29 LAB — HIV ANTIBODY (ROUTINE TESTING W REFLEX): HIV Screen 4th Generation wRfx: NONREACTIVE

## 2023-07-04 ENCOUNTER — Encounter: Payer: Self-pay | Admitting: Cardiology

## 2023-07-04 ENCOUNTER — Ambulatory Visit: Payer: PRIVATE HEALTH INSURANCE | Attending: Cardiology | Admitting: Cardiology

## 2023-07-04 VITALS — BP 122/76 | HR 59 | Ht 65.0 in | Wt 220.6 lb

## 2023-07-04 DIAGNOSIS — E782 Mixed hyperlipidemia: Secondary | ICD-10-CM

## 2023-07-04 DIAGNOSIS — I5032 Chronic diastolic (congestive) heart failure: Secondary | ICD-10-CM | POA: Diagnosis not present

## 2023-07-04 DIAGNOSIS — I10 Essential (primary) hypertension: Secondary | ICD-10-CM

## 2023-07-04 DIAGNOSIS — R002 Palpitations: Secondary | ICD-10-CM

## 2023-07-04 NOTE — Patient Instructions (Signed)
 Medication Instructions:  Your physician recommends that you continue on your current medications as directed. Please refer to the Current Medication list given to you today.  *If you need a refill on your cardiac medications before your next appointment, please call your pharmacy*   Lab Work: None If you have labs (blood work) drawn today and your tests are completely normal, you will receive your results only by: MyChart Message (if you have MyChart) OR A paper copy in the mail If you have any lab test that is abnormal or we need to change your treatment, we will call you to review the results.   Testing/Procedures: None   Follow-Up: At [facility], you and your health needs are our priority.  As part of our continuing mission to provide you with exceptional heart care, we have created designated Provider Care Teams.  These Care Teams include your primary Cardiologist (physician) and Advanced Practice Providers (APPs -  Physician Assistants and Nurse Practitioners) who all work together to provide you with the care you need, when you need it.  We recommend signing up for the patient portal called "MyChart".  Sign up information is provided on this After Visit Summary.  MyChart is used to connect with patients for Virtual Visits (Telemedicine).  Patients are able to view lab/test results, encounter notes, upcoming appointments, etc.  Non-urgent messages can be sent to your provider as well.   To learn more about what you can do with MyChart, go to [email or web address].    Your next appointment:   1 year(s)  Provider:   You may see Dina Rich, MD or one of the following Advanced Practice Providers on your designated Care Team:   Randall An, PA-C  Jacolyn Reedy, [place]     Other Instructions

## 2023-07-04 NOTE — Progress Notes (Signed)
 Clinical Summary Ms. Hebel is a 68 y.o.female seen today for follow up of the following medical problems.       1. Palpitations - 2018 monitor PAC and PVCs - has been on diltiazem and lopressor  -last visit we lowered dilt to 30mg  bid due to low HRs - no recent palpitations   2. HTN - compliant with meds - her home bp's typically 120s/70s   3. Chest pain - long history of atypical chest pain - cath 2014 with patent coronaries. -  admit 12/2014 with chest pain, negative evaluation for ACS.   - 06/2020 nuclear stresss no clear ischemia.    -no recent symptoms   4. COPD - noted on 12/2013 PFTs      6. Chronic diastolic heart failure - echo 12/5282 LVEF 55-60%, abnormal diastolic function.   01/2020 echo :VEF 65-70%, indet diastolic fux    - seldomly needs her prn lasix   6. Dyspnea - had CPX 06/2014, showed mild to mod reduced functoinal capacity       7. IgG kappa monoclonal gammopathy and bony lesion/Plasmacytoma - both followed by heme/onc     8.Hyperlipidemia 07/2022 TC 181 TG 170 HDL 43 LDL 104 - 06/2023 TC 96 TG 64 HDL 46 LD 37     9. DM2 - glycemic control per pcp     10.Strep throat diagnosed 01/23/23         SH: works at [employer] for kids. She has 3 adopted kids [phone]). 3 great grand babies  Father just passed away 08/06/22Husband just recently passed 12/2022.  2 great grand children live with her  2 grandchildren, 2 great grands Past Medical History:  Diagnosis Date   Arthritis    Atypical chest pain    chronic   Cancer (HCC)    bone cancer   Chronic back pain    Chronic diastolic CHF (congestive heart failure) (HCC)    COPD (chronic obstructive pulmonary disease) (HCC)    DDD (degenerative disc disease), cervical    Diabetes mellitus without complication (HCC)    History of radiation therapy 07/16/19-08/22/19   L4 spine ; Dr. Antony Blackbird   Hyperlipidemia    Hypertension    Lumbar radiculopathy    Meningitis     Normal coronary arteries 2014   Palpitations    Premature atrial contractions    PVC's (premature ventricular contractions)      Allergies  Allergen Reactions   Other Swelling    Avon lipstick     Current Outpatient Medications  Medication Sig Dispense Refill   albuterol (PROVENTIL) (2.5 MG/3ML) 0.083% nebulizer solution Take 2.5 mg by nebulization every 6 (six) hours as needed for wheezing or shortness of breath.     albuterol (VENTOLIN HFA) 108 (90 Base) MCG/ACT inhaler Inhale 2 puffs into the lungs every 4 (four) hours as needed for wheezing or shortness of breath.     cetirizine (ZYRTEC) 10 MG tablet Take 10 mg by mouth daily as needed for allergies.     chlorthalidone (HYGROTON) 50 MG tablet TAKE ONE TABLET BY MOUTH ONCE DAILY. 90 tablet 1   Continuous Glucose Sensor (FREESTYLE LIBRE 2 SENSOR) MISC APPLY 1 SENSOR EVERY 14 DAYS     diltiazem (CARDIZEM) 30 MG tablet Take 1 tablet (30 mg total) by mouth 2 (two) times daily. May take additional 30 mg as needed for palpitations 180 tablet 1   furosemide (LASIX) 20 MG tablet Take 1 tablet daily as  needed may take 2 tablets prn 90 tablet 3   HYDROcodone-acetaminophen (NORCO) 10-325 MG tablet Take 1 tablet by mouth every 6 (six) hours as needed for moderate pain. 30 tablet 0   ibuprofen (ADVIL) 800 MG tablet Take 1 tablet (800 mg total) by mouth every 8 (eight) hours as needed. 90 tablet 1   isosorbide mononitrate (IMDUR) 30 MG 24 hr tablet Take 1 tablet (30 mg total) by mouth daily. 90 tablet 3   JARDIANCE 10 MG TABS tablet Take 10 mg by mouth daily.     lidocaine (LIDODERM) 5 % Place 2 patches onto the skin daily as needed. Remove & Discard patch within 12 hours or as directed by MD     losartan (COZAAR) 100 MG tablet TAKE 1 TABLET BY MOUTH DAILY. 90 tablet 1   metoprolol succinate (TOPROL-XL) 50 MG 24 hr tablet TAKE 1 TABLET BY MOUTH DAILY. 90 tablet 1   Multiple Vitamin (MULTIVITAMIN WITH MINERALS) TABS tablet Take 1 tablet by mouth  daily.     nitroGLYCERIN (NITROSTAT) 0.4 MG SL tablet Place 1 tablet (0.4 mg total) under the tongue every 5 (five) minutes x 3 doses as needed for chest pain (if no relief after 2nd dose, proceed to the ED or call 911). 25 tablet 2   oxyCODONE (OXY IR/ROXICODONE) 5 MG immediate release tablet Take 1 tablet (5 mg total) by mouth 2 (two) times daily as needed for severe pain (pain score 7-10) or breakthrough pain. 60 tablet 0   oxyCODONE (ROXICODONE) 5 MG immediate release tablet Take 1 tablet (5 mg total) by mouth 2 (two) times daily as needed for severe pain (pain score 7-10) or breakthrough pain. 60 tablet 0   potassium chloride SA (KLOR-CON M) 20 MEQ tablet Take 1 tablet (20 mEq total) by mouth daily. 90 tablet 3   rosuvastatin (CRESTOR) 20 MG tablet Take 1 tablet (20 mg total) by mouth daily. 100 tablet 1   TURMERIC PO Take 600 mg by mouth daily.     No current facility-administered medications for this visit.     Past Surgical History:  Procedure Laterality Date   ABDOMINAL HYSTERECTOMY     BREAST LUMPECTOMY WITH RADIOFREQUENCY TAG IDENTIFICATION Right 01/25/2021   Procedure: BREAST LUMPECTOMY WITH RADIOFREQUENCY TAG IDENTIFICATION;  Surgeon: Lucretia Roers, MD;  Location: [facility];  Service: General;  Laterality: Right;   EXCISION OF BREAST BIOPSY Right 01/25/2021   Procedure: EXCISION OF BREAST BIOPSY;  Surgeon: Lucretia Roers, MD;  Location: [facility];  Service: General;  Laterality: Right;   IR FLUORO GUIDED NEEDLE PLC ASPIRATION/INJECTION LOC  06/19/2019   LEFT HEART CATHETERIZATION WITH CORONARY ANGIOGRAM N/A 09/05/2012   Procedure: LEFT HEART CATHETERIZATION WITH CORONARY ANGIOGRAM;  Surgeon: Iran Ouch, MD;  Location: [facility];  Service: Cardiovascular;  Laterality: N/A;   RESECTION DISTAL CLAVICAL Left 07/03/2020   Procedure: RESECTION DISTAL CLAVICAL;  Surgeon: Vickki Hearing, MD;  Location: [facility];  Service: Orthopedics;  Laterality: Left;   SHOULDER OPEN ROTATOR  CUFF REPAIR Left 07/03/2020   Procedure: ROTATOR CUFF REPAIR SHOULDER OPEN WITH CHROMEOPLASTY;  Surgeon: Vickki Hearing, MD;  Location: [facility];  Service: Orthopedics;  Laterality: Left;   SHOULDER OPEN ROTATOR CUFF REPAIR Left 07/02/2021   Procedure: ROTATOR CUFF REPAIR SHOULDER OPEN with Patch Graft;  Surgeon: Vickki Hearing, MD;  Location: [facility];  Service: Orthopedics;  Laterality: Left;   THYROID SURGERY  2002     Allergies  Allergen Reactions  Other Swelling    Avon lipstick      Family History  Problem Relation Age of Onset   Heart attack Mother 35   Heart attack Sister 68   Breast cancer Maternal Grandmother    Breast cancer Cousin      Social History Ms. Mones reports that she has never smoked. She has never been exposed to tobacco smoke. She has never used smokeless tobacco. Ms. Langenberg reports no history of alcohol use.   Review of Systems CONSTITUTIONAL: No weight loss, fever, chills, weakness or fatigue.  HEENT: Eyes: No visual loss, blurred vision, double vision or yellow sclerae.No hearing loss, sneezing, congestion, runny nose or sore throat.  SKIN: No rash or itching.  CARDIOVASCULAR: per hpi RESPIRATORY: No shortness of breath, cough or sputum.  GASTROINTESTINAL: No anorexia, nausea, vomiting or diarrhea. No abdominal pain or blood.  GENITOURINARY: No burning on urination, no polyuria NEUROLOGICAL: No headache, dizziness, syncope, paralysis, ataxia, numbness or tingling in the extremities. No change in bowel or bladder control.  MUSCULOSKELETAL: No muscle, back pain, joint pain or stiffness.  LYMPHATICS: No enlarged nodes. No history of splenectomy.  PSYCHIATRIC: No history of depression or anxiety.  ENDOCRINOLOGIC: No reports of sweating, cold or heat intolerance. No polyuria or polydipsia.  Marland Kitchen   Physical Examination Today's Vitals   07/04/23 0830  BP: 122/76  Pulse: (!) 59  SpO2: 97%  Weight: 220 lb 9.6 oz (100.1 kg)  Height: 5\' 5"   (1.651 m)   Body mass index is 36.71 kg/m.  Gen: resting comfortably, no acute distress HEENT: no scleral icterus, pupils equal round and reactive, no palptable cervical adenopathy,  CV: RRR, no mrg, no jvd Resp: Clear to auscultation bilaterally GI: abdomen is soft, non-tender, non-distended, normal bowel sounds, no hepatosplenomegaly MSK: extremities are warm, no edema.  Skin: warm, no rash Neuro:  no focal deficits Psych: appropriate affect   Diagnostic Studies  08/2012 cath Hemodynamics: AO:  164/82   mmHg LV:  169/14    mmHg LVEDP: 24  mmHg   Coronary angiography: Coronary dominance: Right    Left Main:  Normal Left Anterior Descending (LAD):  Normal in size with no significant disease. 1st diagonal (D1):  Small in size with minor irregularities. 2nd diagonal (D2):  Normal in size with no significant disease. 3rd diagonal (D3):  Normal in size with no significant disease. Circumflex (LCx):  Normal in size and nondominant. The vessel has no significant disease. 1st obtuse marginal:  Small in size with no significant disease. 2nd obtuse marginal:  Small in size with no significant disease.  3rd obtuse marginal:  Large in size with no significant disease.   Right Coronary Artery: Normal in size and dominant. The vessel has no significant disease. posterior descending artery: Normal in size with no significant disease. posterior lateral branchs:  Normal in size with no significant disease.   Left ventriculography: Left ventricular systolic function is normal , LVEF is estimated at 60-65% %, there is no significant mitral regurgitation    Final Conclusions:    1. Normal coronary arteries. 2. Normal LV systolic function. 3. Moderately elevated left ventricular end-diastolic pressure likely due to diastolic heart failure.     06/2013 echo Study Conclusions  - Procedure narrative: Transthoracic echocardiography. Image   quality was suboptimal. The study was  technically   difficult, as a result of poor sound wave transmission and   restricted patient mobility. - Left ventricle: The cavity size was normal. Wall thickness   was  increased in a pattern of mild to moderate LVH.   Diastolic dysfunction noted, grade indeterminant. Systolic   function was normal. The estimated ejection fraction was   in the range of 55% to 60%. Wall motion was normal; there   were no regional wall motion abnormalities.     Jan 2017 Event monitor: no arrhythmias   06/2014 CPX Conclusion: Exercise testing with gas exchange demonstrates a mild-moderately reduced functional capacity when compared to matched sedentary norms. There was an in-adequate HR response to the exercise response and flat O2 pulse. This high HR reserve was likely high due to pulmonary limitations based on restrictive spirometry and RR 45 at peak exercise and reaching maximum ventilatory limits. However, it could have easily been due to poor effort. The sub-maximal effort is clouding interpretation of the test. Nevertheless, correlation of resting spirometry test  Showing restriction with outpatient pulmonary function test is warranted     05/2016 heart monitor Telemetry tracings predominately show sinus rhythm Reported symptoms correlate with occasional PVCs and PACs       02/2016 echo Study Conclusions   - Left ventricle: The cavity size was normal. Systolic function was   vigorous. The estimated ejection fraction was in the range of 65%   to 70%. Wall motion was normal; there were no regional wall   motion abnormalities. There was an increased relative   contribution of atrial contraction to ventricular filling.   Doppler parameters are consistent with abnormal left ventricular   relaxation (grade 1 diastolic dysfunction). - Pulmonic valve: There was trivial regurgitation. - Pulmonary arteries: Systolic pressure could not be accurately   estimated.   Assessment and Plan    1.  Palpitations - no recent symptoms, continue current meds     2.HTN - well controlled, continue current meds   3. Chronic diastolic HF -she is euvolemic, continue current prn lasix dosing   4 Hyperlipidemia - very well cotnrolled, continue current meds  F/u 6 months      Antoine Poche, M.D.

## 2023-08-14 ENCOUNTER — Inpatient Hospital Stay: Attending: Hematology

## 2023-08-14 DIAGNOSIS — C9030 Solitary plasmacytoma not having achieved remission: Secondary | ICD-10-CM | POA: Insufficient documentation

## 2023-08-14 DIAGNOSIS — D472 Monoclonal gammopathy: Secondary | ICD-10-CM

## 2023-08-14 LAB — CBC WITH DIFFERENTIAL/PLATELET
Abs Immature Granulocytes: 0 10*3/uL (ref 0.00–0.07)
Basophils Absolute: 0 10*3/uL (ref 0.0–0.1)
Basophils Relative: 0 %
Eosinophils Absolute: 0.1 10*3/uL (ref 0.0–0.5)
Eosinophils Relative: 5 %
HCT: 35.1 % — ABNORMAL LOW (ref 36.0–46.0)
Hemoglobin: 11.5 g/dL — ABNORMAL LOW (ref 12.0–15.0)
Immature Granulocytes: 0 %
Lymphocytes Relative: 35 %
Lymphs Abs: 1.1 10*3/uL (ref 0.7–4.0)
MCH: 29.3 pg (ref 26.0–34.0)
MCHC: 32.8 g/dL (ref 30.0–36.0)
MCV: 89.3 fL (ref 80.0–100.0)
Monocytes Absolute: 0.3 10*3/uL (ref 0.1–1.0)
Monocytes Relative: 11 %
Neutro Abs: 1.5 10*3/uL — ABNORMAL LOW (ref 1.7–7.7)
Neutrophils Relative %: 49 %
Platelets: 248 10*3/uL (ref 150–400)
RBC: 3.93 MIL/uL (ref 3.87–5.11)
RDW: 14.6 % (ref 11.5–15.5)
WBC: 3 10*3/uL — ABNORMAL LOW (ref 4.0–10.5)
nRBC: 0 % (ref 0.0–0.2)

## 2023-08-14 LAB — COMPREHENSIVE METABOLIC PANEL WITH GFR
ALT: 33 U/L (ref 0–44)
AST: 30 U/L (ref 15–41)
Albumin: 3.8 g/dL (ref 3.5–5.0)
Alkaline Phosphatase: 68 U/L (ref 38–126)
Anion gap: 10 (ref 5–15)
BUN: 15 mg/dL (ref 8–23)
CO2: 26 mmol/L (ref 22–32)
Calcium: 9.4 mg/dL (ref 8.9–10.3)
Chloride: 103 mmol/L (ref 98–111)
Creatinine, Ser: 0.88 mg/dL (ref 0.44–1.00)
GFR, Estimated: >60 mL/min (ref >60)
Glucose, Bld: 127 mg/dL — ABNORMAL HIGH (ref 70–99)
Potassium: 3.3 mmol/L — ABNORMAL LOW (ref 3.5–5.1)
Sodium: 139 mmol/L (ref 135–145)
Total Bilirubin: 0.6 mg/dL (ref 0.0–1.2)
Total Protein: 7.6 g/dL (ref 6.5–8.1)

## 2023-08-15 LAB — KAPPA/LAMBDA LIGHT CHAINS
Kappa free light chain: 33.4 mg/L — ABNORMAL HIGH (ref 3.3–19.4)
Kappa, lambda light chain ratio: 1.43 (ref 0.26–1.65)
Lambda free light chains: 23.3 mg/L (ref 5.7–26.3)

## 2023-08-16 ENCOUNTER — Inpatient Hospital Stay: Payer: PRIVATE HEALTH INSURANCE

## 2023-08-16 LAB — PROTEIN ELECTROPHORESIS, SERUM
A/G Ratio: 1.1 (ref 0.7–1.7)
Albumin ELP: 3.8 g/dL (ref 2.9–4.4)
Alpha-1-Globulin: 0.2 g/dL (ref 0.0–0.4)
Alpha-2-Globulin: 0.6 g/dL (ref 0.4–1.0)
Beta Globulin: 1 g/dL (ref 0.7–1.3)
Gamma Globulin: 1.7 g/dL (ref 0.4–1.8)
Globulin, Total: 3.4 g/dL (ref 2.2–3.9)
Total Protein ELP: 7.2 g/dL (ref 6.0–8.5)

## 2023-08-17 LAB — IMMUNOFIXATION ELECTROPHORESIS
IgA: 243 mg/dL (ref 87–352)
IgG (Immunoglobin G), Serum: 1705 mg/dL — ABNORMAL HIGH (ref 586–1602)
IgM (Immunoglobulin M), Srm: 127 mg/dL (ref 26–217)
Total Protein ELP: 7.1 g/dL (ref 6.0–8.5)

## 2023-08-22 NOTE — Progress Notes (Signed)
 [facility] [address], [place] [address]    Clinic Day:  08/23/2023  Referring physician: Roxene Cora, PA*  Patient Care Team: Earlene Gleason as PCP - General (Family Medicine) Amanda Jungling Joyceann No, MD as PCP - Cardiology (Cardiology) Tania Familia, NP as Nurse Practitioner (Nurse Practitioner) Wilson, Diane G, RN as Oncology Nurse Navigator (Oncology) Paulett Boros, MD as Medical Oncologist (Oncology)   ASSESSMENT & PLAN:   Assessment: 1.  Plasmacytoma of L4 vertebral body: -MRI lumbar spine shows L4 lesion. -PET scan on 06/10/2019 shows mildly increased FDG uptake associated with mixed lytic/sclerotic lesion involving L4 vertebral body SUV 4.4. -Serum immunofixation shows IgG kappa monoclonal protein.  SPEP-no M spike.  Kappa light chains elevated at 25.3.  Ratio 1.12.  Lambda light chains 21.9.  Beta-2  microglobulin 2.2. -24-hour urine was negative for UPEP and immunofixation.  Protein was 130 mg. -L4 needle biopsy on 06/19/2019 showed minute segments of the bone and soft tissue, limited cellularity.  IHC for CD138 highlights scattering plasma cells.  Cytokeratin is negative.  Few kappa positive plasma cells present.  Overall material is very limited and essentially nondiagnostic. -Clinically this is consistent with plasmacytoma.  We reviewed bone marrow biopsy results which showed normocellular marrow.  Cytogenetics are normal. -XRT to L4 vertebral body from 07/16/2019 through 08/22/2019. -MRI of the lumbar spine on 12/03/2019 showed unchanged size of the L4 vertebral body lesion, slightly decreased contrast-enhancement with no new lesions.   2.  Low back pain: -She is taking half tablet of hydrocodone  10/325 every 6 hours as needed which is helping. -Likely this will improve upon completion of radiation.  3.  Right breast intraductal papilloma: - Biopsy on 12/01/2020 at 3 o'clock position of the right breast with intraductal  papilloma with florid usual ductal hyperplasia and sclerosing fibrosis. - Right breast lumpectomy on 01/25/2021 with intraductal papilloma with florid UDH and calcifications 1.2 cm.  Margins negative.    Plan: 1.  Plasmacytoma of L4 vertebral body: - Her back pain is stable.  No new pains reported.  She lost her husband late last year and is slightly depressed. - Reviewed labs from 08/04/2023: Creatinine and calcium  are normal.  M spike is not detected.  CBC grossly normal.  Serum free light chain ratio is normal at 1.43.  Kappa light chains are minimally elevated at 33.4 and stable.  Immunofixation was unremarkable. - No "crab" features at this time.  Will continue to monitor at 6 monthly intervals.  Will obtain skeletal survey prior to next visit.   2.  Low back pain: - This pain is stable.  Continue oxycodone  5 mg twice daily as needed.  Will give a refill.   3.  Right breast intraductal papilloma and florid UDH: - She has not had any recent mammogram since 2022.  I have strongly recommended screening mammogram which we will order.    Orders Placed This Encounter  Procedures   MM 3D SCREENING MAMMOGRAM BILATERAL BREAST    Standing Status:   Future    Expected Date:   09/06/2023    Expiration Date:   08/22/2024    Reason for Exam (SYMPTOM  OR DIAGNOSIS REQUIRED):   breast cancer screening    Preferred imaging location?:   [facility]   DG Bone Survey Met    Standing Status:   Future    Expected Date:   02/23/2024    Expiration Date:   08/22/2024    Reason  for Exam (SYMPTOM  OR DIAGNOSIS REQUIRED):   MGUS    Preferred imaging location?:   [facility]   CBC with Differential    Standing Status:   Future    Expected Date:   02/19/2024    Expiration Date:   08/22/2024   Comprehensive metabolic panel    Standing Status:   Future    Expected Date:   02/19/2024    Expiration Date:   08/22/2024   Kappa/lambda light chains    Standing Status:   Future    Expected Date:    02/19/2024    Expiration Date:   08/22/2024   Immunofixation electrophoresis    Standing Status:   Future    Expected Date:   02/19/2024    Expiration Date:   08/22/2024   Protein electrophoresis, serum    Standing Status:   Future    Expected Date:   02/19/2024    Expiration Date:   08/22/2024      I,Katie Daubenspeck,acting as a scribe for Paulett Boros, MD.,have documented all relevant documentation on the behalf of Paulett Boros, MD,as directed by  Paulett Boros, MD while in the presence of Paulett Boros, MD.   I, Paulett Boros MD, have reviewed the above documentation for accuracy and completeness, and I agree with the above.   Paulett Boros, MD   5/7/20259:22 AM  CHIEF COMPLAINT:   Diagnosis: L4 plasmacytoma    Cancer Staging  No matching staging information was found for the patient.    Prior Therapy: XRT in 27 fractions from 07/17/2019 to 08/22/2019   Current Therapy:  surveillance    HISTORY OF PRESENT ILLNESS:   Oncology History   No history exists.     INTERVAL HISTORY:   Hannah Cooper is a 68 y.o. female presenting to clinic today for follow up of L4 plasmacytoma. She was last seen by me on 02/16/23.  Today, she states that she is doing well overall. Her appetite level is at 100%. Her energy level is at 50%.  PAST MEDICAL HISTORY:   Past Medical History: Past Medical History:  Diagnosis Date   Arthritis    Atypical chest pain    chronic   Cancer (HCC)    bone cancer   Chronic back pain    Chronic diastolic CHF (congestive heart failure) (HCC)    COPD (chronic obstructive pulmonary disease) (HCC)    DDD (degenerative disc disease), cervical    Diabetes mellitus without complication (HCC)    History of radiation therapy 07/16/19-08/22/19   L4 spine ; Dr. Retta Caster   Hyperlipidemia    Hypertension    Lumbar radiculopathy    Meningitis    Normal coronary arteries 2014   Palpitations    Premature atrial contractions     PVC's (premature ventricular contractions)     Surgical History: Past Surgical History:  Procedure Laterality Date   ABDOMINAL HYSTERECTOMY     BREAST LUMPECTOMY WITH RADIOFREQUENCY TAG IDENTIFICATION Right 01/25/2021   Procedure: BREAST LUMPECTOMY WITH RADIOFREQUENCY TAG IDENTIFICATION;  Surgeon: Awilda Bogus, MD;  Location: [facility];  Service: General;  Laterality: Right;   EXCISION OF BREAST BIOPSY Right 01/25/2021   Procedure: EXCISION OF BREAST BIOPSY;  Surgeon: Awilda Bogus, MD;  Location: [facility];  Service: General;  Laterality: Right;   IR FLUORO GUIDED NEEDLE PLC ASPIRATION/INJECTION LOC  06/19/2019   LEFT HEART CATHETERIZATION WITH CORONARY ANGIOGRAM N/A 09/05/2012   Procedure: LEFT HEART CATHETERIZATION WITH CORONARY ANGIOGRAM;  Surgeon: Wenona Hamilton,  MD;  Location: [facility];  Service: Cardiovascular;  Laterality: N/A;   RESECTION DISTAL CLAVICAL Left 07/03/2020   Procedure: RESECTION DISTAL CLAVICAL;  Surgeon: Darrin Emerald, MD;  Location: [facility];  Service: Orthopedics;  Laterality: Left;   SHOULDER OPEN ROTATOR CUFF REPAIR Left 07/03/2020   Procedure: ROTATOR CUFF REPAIR SHOULDER OPEN WITH CHROMEOPLASTY;  Surgeon: Darrin Emerald, MD;  Location: [facility];  Service: Orthopedics;  Laterality: Left;   SHOULDER OPEN ROTATOR CUFF REPAIR Left 07/02/2021   Procedure: ROTATOR CUFF REPAIR SHOULDER OPEN with Patch Graft;  Surgeon: Darrin Emerald, MD;  Location: [facility];  Service: Orthopedics;  Laterality: Left;   THYROID  SURGERY  2002    Social History: Social History   Socioeconomic History   Marital status: Married    Spouse name: Not on file   Number of children: Not on file   Years of education: Not on file   Highest education level: Not on file  Occupational History   Not on file  Tobacco Use   Smoking status: Never    Passive exposure: Never   Smokeless tobacco: Never  Vaping Use   Vaping status: Never Used  Substance and Sexual Activity    Alcohol use: No    Alcohol/week: 0.0 standard drinks of alcohol   Drug use: No   Sexual activity: Yes    Partners: Male  Other Topics Concern   Not on file  Social History Narrative   Not on file   Social Drivers of Health   Financial Resource Strain: Low Risk  (05/28/2019)   Overall Financial Resource Strain (CARDIA)    Difficulty of Paying Living Expenses: Not hard at all  Food Insecurity: No Food Insecurity (05/28/2019)   Hunger Vital Sign    Worried About Running Out of Food in the Last Year: Never true    Ran Out of Food in the Last Year: Never true  Transportation Needs: No Transportation Needs (05/28/2019)   PRAPARE - Administrator, Civil Service (Medical): No    Lack of Transportation (Non-Medical): No  Physical Activity: Inactive (05/28/2019)   Exercise Vital Sign    Days of Exercise per Week: 0 days    Minutes of Exercise per Session: 0 min  Stress: Stress Concern Present (05/28/2019)   [employer] of Occupational Health - Occupational Stress Questionnaire    Feeling of Stress : To some extent  Social Connections: Unknown (08/31/2021)   Received from [facility]   Social Network    Social Network: Not on file  Intimate Partner Violence: Unknown (07/23/2021)   Received from [facility]   HITS    Physically Hurt: Not on file    Insult or Talk Down To: Not on file    Threaten Physical Harm: Not on file    Scream or Curse: Not on file    Family History: Family History  Problem Relation Age of Onset   Heart attack Mother 23   Heart attack Sister 72   Breast cancer Maternal Grandmother    Breast cancer Cousin     Current Medications:  Current Outpatient Medications:    albuterol  (PROVENTIL ) (2.5 MG/3ML) 0.083% nebulizer solution, Take 2.5 mg by nebulization every 6 (six) hours as needed for wheezing or shortness of breath., Disp: , Rfl:    albuterol  (VENTOLIN  HFA) 108 (90 Base) MCG/ACT inhaler, Inhale 2 puffs into the  lungs every 4 (four) hours as needed for wheezing or shortness of breath.,  Disp: , Rfl:    cetirizine  (ZYRTEC ) 10 MG tablet, Take 10 mg by mouth daily as needed for allergies., Disp: , Rfl:    chlorthalidone  (HYGROTON ) 50 MG tablet, TAKE ONE TABLET BY MOUTH ONCE DAILY., Disp: 90 tablet, Rfl: 1   Continuous Glucose Sensor (FREESTYLE LIBRE 2 SENSOR) MISC, APPLY 1 SENSOR EVERY 14 DAYS, Disp: , Rfl:    diltiazem  (CARDIZEM ) 30 MG tablet, Take 1 tablet (30 mg total) by mouth 2 (two) times daily. May take additional 30 mg as needed for palpitations, Disp: 180 tablet, Rfl: 1   furosemide  (LASIX ) 20 MG tablet, Take 1 tablet daily as needed may take 2 tablets prn, Disp: 90 tablet, Rfl: 3   HYDROcodone -acetaminophen  (NORCO) 10-325 MG tablet, Take 1 tablet by mouth every 6 (six) hours as needed for moderate pain., Disp: 30 tablet, Rfl: 0   ibuprofen  (ADVIL ) 800 MG tablet, Take 1 tablet (800 mg total) by mouth every 8 (eight) hours as needed., Disp: 90 tablet, Rfl: 1   isosorbide  mononitrate (IMDUR ) 30 MG 24 hr tablet, Take 1 tablet (30 mg total) by mouth daily., Disp: 90 tablet, Rfl: 3   JARDIANCE 10 MG TABS tablet, Take 10 mg by mouth daily., Disp: , Rfl:    lidocaine  (LIDODERM ) 5 %, Place 2 patches onto the skin daily as needed. Remove & Discard patch within 12 hours or as directed by MD, Disp: , Rfl:    losartan  (COZAAR ) 100 MG tablet, TAKE 1 TABLET BY MOUTH DAILY., Disp: 90 tablet, Rfl: 1   metoprolol  succinate (TOPROL -XL) 50 MG 24 hr tablet, TAKE 1 TABLET BY MOUTH DAILY., Disp: 90 tablet, Rfl: 1   Multiple Vitamin (MULTIVITAMIN WITH MINERALS) TABS tablet, Take 1 tablet by mouth daily., Disp: , Rfl:    nitroGLYCERIN  (NITROSTAT ) 0.4 MG SL tablet, Place 1 tablet (0.4 mg total) under the tongue every 5 (five) minutes x 3 doses as needed for chest pain (if no relief after 2nd dose, proceed to the ED or call 911)., Disp: 25 tablet, Rfl: 2   oxyCODONE  (OXY IR/ROXICODONE ) 5 MG immediate release tablet, Take 1 tablet  (5 mg total) by mouth 2 (two) times daily as needed for severe pain (pain score 7-10) or breakthrough pain., Disp: 60 tablet, Rfl: 0   potassium chloride  SA (KLOR-CON  M) 20 MEQ tablet, Take 1 tablet (20 mEq total) by mouth daily., Disp: 90 tablet, Rfl: 3   rosuvastatin  (CRESTOR ) 20 MG tablet, Take 1 tablet (20 mg total) by mouth daily., Disp: 100 tablet, Rfl: 1   TURMERIC PO, Take 600 mg by mouth daily., Disp: , Rfl:    Allergies: Allergies  Allergen Reactions   Other Swelling    [place] lipstick    REVIEW OF SYSTEMS:   Review of Systems  Constitutional:  Negative for chills, fatigue and fever.  HENT:   Negative for lump/mass, mouth sores, nosebleeds, sore throat and trouble swallowing.   Eyes:  Negative for eye problems.  Respiratory:  Negative for cough and shortness of breath.   Cardiovascular:  Positive for palpitations. Negative for chest pain and leg swelling.  Gastrointestinal:  Negative for abdominal pain, constipation, diarrhea, nausea and vomiting.  Genitourinary:  Negative for bladder incontinence, difficulty urinating, dysuria, frequency, hematuria and nocturia.   Musculoskeletal:  Negative for arthralgias, back pain, flank pain, myalgias and neck pain.  Skin:  Negative for itching and rash.  Neurological:  Negative for dizziness, headaches and numbness.  Hematological:  Does not bruise/bleed easily.  Psychiatric/Behavioral:  Negative for  depression, sleep disturbance and suicidal ideas. The patient is not nervous/anxious.   All other systems reviewed and are negative.    VITALS:   Blood pressure (!) 142/88, pulse (!) 57, temperature 98.3 F (36.8 C), resp. rate 18, SpO2 98%.  Wt Readings from Last 3 Encounters:  07/04/23 220 lb 9.6 oz (100.1 kg)  02/16/23 208 lb 12.8 oz (94.7 kg)  01/27/23 215 lb (97.5 kg)    There is no height or weight on file to calculate BMI.  Performance status (ECOG): 1 - Symptomatic but completely ambulatory  PHYSICAL EXAM:   Physical  Exam Vitals and nursing note reviewed. Exam conducted with a chaperone present.  Constitutional:      Appearance: Normal appearance.  Cardiovascular:     Rate and Rhythm: Normal rate and regular rhythm.     Pulses: Normal pulses.     Heart sounds: Normal heart sounds.  Pulmonary:     Effort: Pulmonary effort is normal.     Breath sounds: Normal breath sounds.  Abdominal:     Palpations: Abdomen is soft. There is no hepatomegaly, splenomegaly or mass.     Tenderness: There is no abdominal tenderness.  Musculoskeletal:     Right lower leg: No edema.     Left lower leg: No edema.  Lymphadenopathy:     Cervical: No cervical adenopathy.     Right cervical: No superficial, deep or posterior cervical adenopathy.    Left cervical: No superficial, deep or posterior cervical adenopathy.     Upper Body:     Right upper body: No supraclavicular or axillary adenopathy.     Left upper body: No supraclavicular or axillary adenopathy.  Neurological:     General: No focal deficit present.     Mental Status: She is alert and oriented to person, place, and time.  Psychiatric:        Mood and Affect: Mood normal.        Behavior: Behavior normal.     LABS:   CBC     Component Value Date/Time   WBC 3.0 (L) 08/14/2023 1213   RBC 3.93 08/14/2023 1213   HGB 11.5 (L) 08/14/2023 1213   HCT 35.1 (L) 08/14/2023 1213   PLT 248 08/14/2023 1213   MCV 89.3 08/14/2023 1213   MCH 29.3 08/14/2023 1213   MCHC 32.8 08/14/2023 1213   RDW 14.6 08/14/2023 1213   LYMPHSABS 1.1 08/14/2023 1213   MONOABS 0.3 08/14/2023 1213   EOSABS 0.1 08/14/2023 1213   BASOSABS 0.0 08/14/2023 1213    CMP      Component Value Date/Time   NA 139 08/14/2023 1213   K 3.3 (L) 08/14/2023 1213   CL 103 08/14/2023 1213   CO2 26 08/14/2023 1213   GLUCOSE 127 (H) 08/14/2023 1213   BUN 15 08/14/2023 1213   CREATININE 0.88 08/14/2023 1213   CREATININE 0.86 03/18/2020 1114   CALCIUM  9.4 08/14/2023 1213   PROT 7.6  08/14/2023 1213   ALBUMIN 3.8 08/14/2023 1213   AST 30 08/14/2023 1213   ALT 33 08/14/2023 1213   ALKPHOS 68 08/14/2023 1213   BILITOT 0.6 08/14/2023 1213   GFRNONAA >60 08/14/2023 1213   GFRAA >60 11/13/2019 1355     No results found for: "CEA1", "CEA" / No results found for: "CEA1", "CEA" No results found for: "PSA1" No results found for: "QMV784" No results found for: "CAN125"  Lab Results  Component Value Date   TOTALPROTELP 7.2 08/14/2023   TOTALPROTELP 7.1 08/14/2023  ALBUMINELP 3.8 08/14/2023   A1GS 0.2 08/14/2023   A2GS 0.6 08/14/2023   BETS 1.0 08/14/2023   GAMS 1.7 08/14/2023   MSPIKE Not Observed 08/14/2023   SPEI Comment 08/14/2023   Lab Results  Component Value Date   FERRITIN 346 (H) 11/13/2019   Lab Results  Component Value Date   LDH 158 11/09/2021   LDH 159 05/03/2021   LDH 144 01/27/2021     STUDIES:   No results found.

## 2023-08-23 ENCOUNTER — Inpatient Hospital Stay: Payer: PRIVATE HEALTH INSURANCE | Attending: Hematology | Admitting: Hematology

## 2023-08-23 ENCOUNTER — Other Ambulatory Visit: Payer: Self-pay | Admitting: *Deleted

## 2023-08-23 VITALS — BP 142/88 | HR 57 | Temp 98.3°F | Resp 18

## 2023-08-23 DIAGNOSIS — M545 Low back pain, unspecified: Secondary | ICD-10-CM | POA: Insufficient documentation

## 2023-08-23 DIAGNOSIS — C9030 Solitary plasmacytoma not having achieved remission: Secondary | ICD-10-CM | POA: Insufficient documentation

## 2023-08-23 DIAGNOSIS — Z1231 Encounter for screening mammogram for malignant neoplasm of breast: Secondary | ICD-10-CM | POA: Diagnosis not present

## 2023-08-23 DIAGNOSIS — D472 Monoclonal gammopathy: Secondary | ICD-10-CM

## 2023-08-23 DIAGNOSIS — D241 Benign neoplasm of right breast: Secondary | ICD-10-CM | POA: Insufficient documentation

## 2023-08-23 DIAGNOSIS — Z79899 Other long term (current) drug therapy: Secondary | ICD-10-CM | POA: Diagnosis not present

## 2023-08-23 MED ORDER — OXYCODONE HCL 5 MG PO TABS
5.0000 mg | ORAL_TABLET | Freq: Two times a day (BID) | ORAL | 0 refills | Status: DC | PRN
Start: 2023-08-23 — End: 2024-02-07

## 2023-08-23 NOTE — Addendum Note (Signed)
 Addended by: Luismanuel Corman on: 08/23/2023 09:23 AM   Modules accepted: Orders

## 2023-08-23 NOTE — Patient Instructions (Signed)

## 2023-09-26 ENCOUNTER — Ambulatory Visit: Payer: Self-pay | Admitting: Nurse Practitioner

## 2023-10-16 NOTE — Patient Instructions (Signed)
 Hypoglycemia Hypoglycemia is when the amount of sugar, or glucose, in your blood is too low. Low blood sugar can happen if you have diabetes or if you don't have diabetes. It may be an emergency. What are the causes? Low blood sugar happens most often in people who have diabetes. It may be caused by: Diabetes medicine. Not eating enough, or not eating often enough. Being more active than normal. If you don't have diabetes, you may still get low blood sugar if: There's a tumor in your pancreas. A tumor is a growth of cells that isn't normal. You don't eat enough, or you fast. Fasting is when you don't eat for long periods at a time. You have a bad infection or illness. You have problems after weight loss surgery. You have kidney or liver problems. You take certain medicines. What increases the risk? You're more likely to have low blood sugar if: You have diabetes and take medicine for it. You drink a lot of alcohol. You get sick. What are the signs or symptoms? Mild Hunger or feeling like you may vomit. Sweating and feeling cold to the touch. Feeling dizzy or light-headed. Being sleepy or having trouble sleeping. A headache. Blurry vision. Mood changes. These include feeling worried, nervous, or easily annoyed. Moderate Feeling confused. Changes in the way you act. Weakness. An uneven heartbeat. Very bad Having very low blood sugar is an emergency. It can cause: Fainting. Seizures. A coma. Death. How is this diagnosed?  Low blood sugar can be found with a blood test. This test tells you how much sugar is in your blood. It's done while you're having symptoms. Your health care provider may also do an exam and look at your medical history. How is this treated? Treating low blood sugar If you have low blood sugar, eat or drink something with sugar in it right away. The food or drink should have 15 grams of a fast-acting carbohydrate (carb). Options include: 4 oz (120 mL) of  fruit juice. 4 oz (120 mL) of soda (not diet soda). A few pieces of hard candy. Check food labels to see how many pieces to eat. 1 Tbsp (15 mL) of sugar or honey. 4 glucose tablets. 1 tube of glucose gel. Treating low blood sugar if you have diabetes Talk with your provider about how much carb you should take. If you're alert and can swallow safely, you may follow the 15:15 rule: Take 15 grams of a fast-acting carb. Check your blood sugar 15 minutes after you take the carb. If your blood sugar is still at or below 70 mg/dL (3.9 mmol/L), take 15 grams of a carb again. If your blood sugar doesn't go above 70 mg/dL (3.9 mmol/L) after 3 tries, get help right away. After your blood sugar goes back to normal, eat a meal or a snack within 1 hour. Always keep 15 grams of a fast-acting carb with you. This could be: 4 glucose tablets. A few pieces of hard candy. 1 Tbsp (15 mL) of honey or sugar. 1 tube of glucose gel. Treating very low blood sugar If your blood sugar is less than 54 mg/dL (3 mmol/L), it's an emergency. Get help right away. If you can't eat or drink, you will need to be given glucagon. A family member or friend should learn how to check your blood sugar and give you glucagon. Ask your provider if you should keep a glucagon kit at home. You may also need to be treated in a hospital. Follow  these instructions at home: If you have diabetes: Always keep a fast-acting carb (15 grams) with you. Follow your diabetes care plan. Make sure you: Know the symptoms of low blood sugar. Check your blood sugar as often as told. Always check it before and after you exercise. Always check your blood sugar before you drive. Take your medicines as told. Eat on time. Do not skip meals. Share your diabetes care plan with: Your work or school. The people you live with. Wear an alert bracelet or carry a card that says you have diabetes. General instructions If you drink alcohol: Limit how much you  have to: 0-1 drink a day if you're female. 0-2 drinks a day if you're female. Know how much alcohol is in your drink. In the [place] one drink is one 12 oz bottle of beer (355 mL), one 5 oz glass of wine (148 mL), or one 1 oz glass of hard liquor (44 mL). Be sure to eat food when you drink alcohol. Be sure to check your blood sugar after you drink. Alcohol may lead to low blood sugar later. Where to find more information American Diabetes Association (ADA): diabetes.org Contact a health care provider if: You have low blood sugar often. You have diabetes and are having trouble keeping your blood sugar in the right range. Get help right away if: You can't get your blood sugar above 70 mg/dL (3.9 mmol/L) after 3 tries. Your blood sugar is below 54 mg/dL (3 mmol/L). You have a seizure. You faint. These symptoms may be an emergency. Call 911 right away. Do not wait to see if the symptoms will go away. Do not drive yourself to the hospital. This information is not intended to replace advice given to you by your health care provider. Make sure you discuss any questions you have with your health care provider. Document Revised: 01/05/2023 Document Reviewed: 06/22/2022 Elsevier Patient Education  2024 [employer].

## 2023-10-19 ENCOUNTER — Encounter: Payer: Self-pay | Admitting: Nurse Practitioner

## 2023-10-19 ENCOUNTER — Ambulatory Visit (INDEPENDENT_AMBULATORY_CARE_PROVIDER_SITE_OTHER): Payer: Self-pay | Admitting: Nurse Practitioner

## 2023-10-19 VITALS — BP 120/76 | HR 61 | Ht 65.0 in | Wt 216.0 lb

## 2023-10-19 DIAGNOSIS — E782 Mixed hyperlipidemia: Secondary | ICD-10-CM

## 2023-10-19 DIAGNOSIS — E11649 Type 2 diabetes mellitus with hypoglycemia without coma: Secondary | ICD-10-CM | POA: Diagnosis not present

## 2023-10-19 DIAGNOSIS — I10 Essential (primary) hypertension: Secondary | ICD-10-CM | POA: Diagnosis not present

## 2023-10-19 LAB — POCT GLYCOSYLATED HEMOGLOBIN (HGB A1C): Hemoglobin A1C: 6.6 % — AB (ref 4.0–5.6)

## 2023-10-19 NOTE — Progress Notes (Signed)
 Endocrinology Consult Note       10/19/2023, 11:21 AM   Subjective:    Patient ID: Hannah Cooper, female    DOB: Nov 19, 1955.  Hannah Cooper is being seen in consultation for management of currently uncontrolled symptomatic diabetes requested by  Leonce Lucie PARAS, PA-C.   Past Medical History:  Diagnosis Date   Arthritis    Atypical chest pain    chronic   Cancer (HCC)    bone cancer   Chronic back pain    Chronic diastolic CHF (congestive heart failure) (HCC)    COPD (chronic obstructive pulmonary disease) (HCC)    DDD (degenerative disc disease), cervical    Diabetes mellitus without complication (HCC)    History of radiation therapy 07/16/19-08/22/19   L4 spine ; Dr. Lynwood Nasuti   Hyperlipidemia    Hypertension    Lumbar radiculopathy    Meningitis    Normal coronary arteries 2014   Palpitations    Premature atrial contractions    PVC's (premature ventricular contractions)     Past Surgical History:  Procedure Laterality Date   ABDOMINAL HYSTERECTOMY     BREAST LUMPECTOMY WITH RADIOFREQUENCY TAG IDENTIFICATION Right 01/25/2021   Procedure: BREAST LUMPECTOMY WITH RADIOFREQUENCY TAG IDENTIFICATION;  Surgeon: Kallie Manuelita BROCKS, MD;  Location: [facility];  Service: General;  Laterality: Right;   EXCISION OF BREAST BIOPSY Right 01/25/2021   Procedure: EXCISION OF BREAST BIOPSY;  Surgeon: Kallie Manuelita BROCKS, MD;  Location: [facility];  Service: General;  Laterality: Right;   IR FLUORO GUIDED NEEDLE PLC ASPIRATION/INJECTION LOC  06/19/2019   LEFT HEART CATHETERIZATION WITH CORONARY ANGIOGRAM N/A 09/05/2012   Procedure: LEFT HEART CATHETERIZATION WITH CORONARY ANGIOGRAM;  Surgeon: Deatrice DELENA Cage, MD;  Location: [facility];  Service: Cardiovascular;  Laterality: N/A;   RESECTION DISTAL CLAVICAL Left 07/03/2020   Procedure: RESECTION DISTAL CLAVICAL;  Surgeon: Margrette Taft BRAVO, MD;  Location: [facility];   Service: Orthopedics;  Laterality: Left;   SHOULDER OPEN ROTATOR CUFF REPAIR Left 07/03/2020   Procedure: ROTATOR CUFF REPAIR SHOULDER OPEN WITH CHROMEOPLASTY;  Surgeon: Margrette Taft BRAVO, MD;  Location: [facility];  Service: Orthopedics;  Laterality: Left;   SHOULDER OPEN ROTATOR CUFF REPAIR Left 07/02/2021   Procedure: ROTATOR CUFF REPAIR SHOULDER OPEN with Patch Graft;  Surgeon: Margrette Taft BRAVO, MD;  Location: [facility];  Service: Orthopedics;  Laterality: Left;   THYROID  SURGERY  2002    Social History   Socioeconomic History   Marital status: Widowed    Spouse name: Not on file   Number of children: Not on file   Years of education: Not on file   Highest education level: Not on file  Occupational History   Not on file  Tobacco Use   Smoking status: Never    Passive exposure: Never   Smokeless tobacco: Never  Vaping Use   Vaping status: Never Used  Substance and Sexual Activity   Alcohol use: No    Alcohol/week: 0.0 standard drinks of alcohol   Drug use: No   Sexual activity: Yes    Partners: Male  Other Topics Concern   Not on file  Social History Narrative   Not on  file   Social Drivers of Health   Financial Resource Strain: Low Risk  (05/28/2019)   Overall Financial Resource Strain (CARDIA)    Difficulty of Paying Living Expenses: Not hard at all  Food Insecurity: No Food Insecurity (05/28/2019)   Hunger Vital Sign    Worried About Running Out of Food in the Last Year: Never true    Ran Out of Food in the Last Year: Never true  Transportation Needs: No Transportation Needs (05/28/2019)   PRAPARE - Administrator, Civil Service (Medical): No    Lack of Transportation (Non-Medical): No  Physical Activity: Inactive (05/28/2019)   Exercise Vital Sign    Days of Exercise per Week: 0 days    Minutes of Exercise per Session: 0 min  Stress: Stress Concern Present (05/28/2019)   [employer] of Occupational Health - Occupational Stress Questionnaire    Feeling  of Stress : To some extent  Social Connections: Unknown (08/31/2021)   [facility]   Social Network    Social Network: Not on file    Family History  Problem Relation Age of Onset   Heart attack Mother 29   Heart attack Sister 74   Breast cancer Maternal Grandmother    Breast cancer Cousin     Outpatient Encounter Medications as of 10/19/2023  Medication Sig   albuterol  (PROVENTIL ) (2.5 MG/3ML) 0.083% nebulizer solution Take 2.5 mg by nebulization every 6 (six) hours as needed for wheezing or shortness of breath.   albuterol  (VENTOLIN  HFA) 108 (90 Base) MCG/ACT inhaler Inhale 2 puffs into the lungs every 4 (four) hours as needed for wheezing or shortness of breath.   cetirizine  (ZYRTEC ) 10 MG tablet Take 10 mg by mouth daily as needed for allergies.   chlorthalidone  (HYGROTON ) 50 MG tablet TAKE ONE TABLET BY MOUTH ONCE DAILY.   Continuous Glucose Sensor (FREESTYLE LIBRE 2 SENSOR) MISC APPLY 1 SENSOR EVERY 14 DAYS   diltiazem  (CARDIZEM ) 30 MG tablet Take 1 tablet (30 mg total) by mouth 2 (two) times daily. May take additional 30 mg as needed for palpitations   furosemide  (LASIX ) 20 MG tablet Take 1 tablet daily as needed may take 2 tablets prn (Patient taking differently: as needed. Take 1 tablet daily as needed may take 2 tablets prn)   ibuprofen  (ADVIL ) 800 MG tablet Take 1 tablet (800 mg total) by mouth every 8 (eight) hours as needed.   isosorbide  mononitrate (IMDUR ) 30 MG 24 hr tablet Take 1 tablet (30 mg total) by mouth daily.   lidocaine  (LIDODERM ) 5 % Place 2 patches onto the skin daily as needed. Remove & Discard patch within 12 hours or as directed by MD   losartan  (COZAAR ) 100 MG tablet TAKE 1 TABLET BY MOUTH DAILY.   metoprolol  succinate (TOPROL -XL) 50 MG 24 hr tablet TAKE 1 TABLET BY MOUTH DAILY.   Multiple Vitamin (MULTIVITAMIN WITH MINERALS) TABS tablet Take 1 tablet by mouth daily.   nitroGLYCERIN  (NITROSTAT ) 0.4 MG SL tablet Place 1 tablet (0.4 mg total)  under the tongue every 5 (five) minutes x 3 doses as needed for chest pain (if no relief after 2nd dose, proceed to the ED or call 911).   oxyCODONE  (OXY IR/ROXICODONE ) 5 MG immediate release tablet Take 1 tablet (5 mg total) by mouth 2 (two) times daily as needed for severe pain (pain score 7-10) or breakthrough pain.   potassium chloride  SA (KLOR-CON  M) 20 MEQ tablet Take 1 tablet (20 mEq total) by mouth  daily. (Patient taking differently: Take 20 mEq by mouth as needed.)   rosuvastatin  (CRESTOR ) 20 MG tablet Take 1 tablet (20 mg total) by mouth daily.   TURMERIC PO Take 600 mg by mouth daily. (Patient not taking: Reported on 10/19/2023)   [DISCONTINUED] JARDIANCE 10 MG TABS tablet Take 10 mg by mouth daily. (Patient not taking: Reported on 10/19/2023)   No facility-administered encounter medications on file as of 10/19/2023.    ALLERGIES: Allergies  Allergen Reactions   Other Swelling    Avon lipstick    VACCINATION STATUS: Immunization History  Administered Date(s) Administered   Influenza Whole 01/18/2008   Influenza,inj,Quad PF,6+ Mos 01/05/2015   Pneumococcal Polysaccharide-23 01/05/2015    Diabetes She presents for her initial diabetic visit. She has type 2 diabetes mellitus. Onset time: diagnosed at approx ge of 63. Her disease course has been stable. Hypoglycemia symptoms include nervousness/anxiousness, sweats and tremors. There are no diabetic associated symptoms. Hypoglycemia complications include nocturnal hypoglycemia. Symptoms are stable. Diabetic complications include heart disease. Risk factors for coronary artery disease include diabetes mellitus, obesity, hypertension, family history, post-menopausal and sedentary lifestyle. Current diabetic treatment includes diet. Her weight is fluctuating minimally. She is following a generally unhealthy diet. When asked about meal planning, she reported none. She has not had a previous visit with a dietitian. She rarely participates in  exercise. (She presents today for her consultation with her CGM showing at target glycemic profile overall.  Her POCT A1c today is 6.6%, improving from last A1c of 7.1%.  She notes she stopped her Jardiance on her own a while ago given her tendency to drop in glucose.  She drinks water, french vanilla coffee with sugar, and sips on a soda throughout the day.  She eats 2 meals per day on average and snacks in between at times (skips lunch most days).  She does not engage in routine physical activity due to back pain (from cancer).  She is UTD on eye exam, has never seen podiatry in the past.  Analysis of her CGM shows TIR 99%, TAR 0%, TBR 1%.) An ACE inhibitor/angiotensin II receptor blocker is being taken. She does not see a podiatrist.Eye exam is current.     Review of systems  Constitutional: + Minimally fluctuating body weight, current Body mass index is 35.94 kg/m., no fatigue, no subjective hyperthermia, no subjective hypothermia Eyes: no blurry vision, no xerophthalmia ENT: no sore throat, no nodules palpated in throat, no dysphagia/odynophagia, no hoarseness Cardiovascular: no chest pain, no shortness of breath, no palpitations, no leg swelling Respiratory: no cough, no shortness of breath Gastrointestinal: no nausea/vomiting/diarrhea Musculoskeletal: + chronic back pain Skin: no rashes, no hyperemia Neurological: no tremors, no numbness, no tingling, no dizziness Psychiatric: no depression, no anxiety  Objective:     BP 120/76 (BP Location: Left Arm, Patient Position: Sitting, Cuff Size: Large)   Pulse 61   Ht 5' 5 (1.651 m)   Wt 216 lb (98 kg)   BMI 35.94 kg/m   Wt Readings from Last 3 Encounters:  10/19/23 216 lb (98 kg)  07/04/23 220 lb 9.6 oz (100.1 kg)  02/16/23 208 lb 12.8 oz (94.7 kg)     BP Readings from Last 3 Encounters:  10/19/23 120/76  08/23/23 (!) 142/88  07/04/23 122/76     Physical Exam- Limited  Constitutional:  Body mass index is 35.94 kg/m. , not  in acute distress, normal state of mind Eyes:  EOMI, no exophthalmos Neck: Supple Cardiovascular: mildly bradycardic upon auscultation,  no murmurs, rubs, or gallops, no edema Respiratory: Adequate breathing efforts, no crackles, rales, rhonchi, or wheezing Musculoskeletal: no gross deformities, strength intact in all four extremities, no gross restriction of joint movements Skin:  no rashes, no hyperemia Neurological: no tremor with outstretched hands   Diabetic Foot Exam - Simple   No data filed      CMP ( most recent) CMP     Component Value Date/Time   NA 139 08/14/2023 1213   K 3.3 (L) 08/14/2023 1213   CL 103 08/14/2023 1213   CO2 26 08/14/2023 1213   GLUCOSE 127 (H) 08/14/2023 1213   BUN 15 08/14/2023 1213   CREATININE 0.88 08/14/2023 1213   CREATININE 0.86 03/18/2020 1114   CALCIUM  9.4 08/14/2023 1213   PROT 7.6 08/14/2023 1213   ALBUMIN 3.8 08/14/2023 1213   AST 30 08/14/2023 1213   ALT 33 08/14/2023 1213   ALKPHOS 68 08/14/2023 1213   BILITOT 0.6 08/14/2023 1213   GFRNONAA >60 08/14/2023 1213     Diabetic Labs (most recent): Lab Results  Component Value Date   HGBA1C 6.6 (A) 10/19/2023   HGBA1C 6.6 (H) 06/29/2021   HGBA1C 6.8 (H) 01/20/2021   MICROALBUR 84.4 (H) 08/05/2022     Lipid Panel ( most recent) Lipid Panel     Component Value Date/Time   CHOL 96 06/29/2023 1002   TRIG 64 06/29/2023 1002   HDL 46 06/29/2023 1002   CHOLHDL 2.1 06/29/2023 1002   VLDL 13 06/29/2023 1002   LDLCALC 37 06/29/2023 1002      Lab Results  Component Value Date   TSH 2.571 08/05/2022   TSH 2.355 05/01/2020   TSH 4.268 10/07/2017   TSH 2.150 05/18/2016   TSH 2.764 01/05/2015   TSH 4.995 (H) 07/05/2013   TSH 2.973 09/20/2012   TSH 3.909 09/04/2012   TSH 4.329 01/18/2008   TSH 4.275 06/29/2007   FREET4 0.97 01/18/2008   FREET4 0.80 (L) 06/29/2007           Assessment & Plan:   1) Type 2 diabetes mellitus with hypoglycemia without coma, without  long-term current use of insulin  (HCC) (Primary)  She presents today for her consultation with her CGM showing at target glycemic profile overall.  Her POCT A1c today is 6.6%, improving from last A1c of 7.1%.  She notes she stopped her Jardiance on her own a while ago given her tendency to drop in glucose.  She drinks water, french vanilla coffee with sugar, and sips on a soda throughout the day.  She eats 2 meals per day on average and snacks in between at times (skips lunch most days).  She does not engage in routine physical activity due to back pain (from cancer).  She is UTD on eye exam, has never seen podiatry in the past.  Analysis of her CGM shows TIR 99%, TAR 0%, TBR 1%.  - Jamelle N Frances has currently uncontrolled symptomatic type 2 DM since 68 years of age, with most recent A1c of 6.6 %.   -Recent labs reviewed.  - I had a long discussion with her about the progressive nature of diabetes and the pathology behind its complications. -her diabetes is complicated by CVA, CHF and she remains at a high risk for more acute and chronic complications which include CAD, CVA, CKD, retinopathy, and neuropathy. These are all discussed in detail with her.  The following Lifestyle Medicine recommendations according to American College of Lifestyle [employer] [facility]) were discussed and offered  to patient and she agrees to start the journey:  A. Whole Foods, Plant-based plate comprising of fruits and vegetables, plant-based proteins, whole-grain carbohydrates was discussed in detail with the patient.   A list for source of those nutrients were also provided to the patient.  Patient will use only water or unsweetened tea for hydration. B.  The need to stay away from risky substances including alcohol, smoking; obtaining 7 to 9 hours of restorative sleep, at least 150 minutes of moderate intensity exercise weekly, the importance of healthy social connections,  and stress reduction techniques were discussed. C.   A full color page of  Calorie density of various food groups per pound showing examples of each food groups was provided to the patient.  - I have counseled her on diet and weight management by adopting a carbohydrate restricted/protein rich diet. Patient is encouraged to switch to unprocessed or minimally processed complex starch and increased protein intake (animal or plant source), fruits, and vegetables. -  she is advised to stick to a routine mealtimes to eat 3 meals a day and avoid unnecessary snacks (to snack only to correct hypoglycemia).   - she acknowledges that there is a room for improvement in her food and drink choices. - Suggestion is made for her to avoid simple carbohydrates from her diet including Cakes, Sweet Desserts, Ice Cream, Soda (diet and regular), Sweet Tea, Candies, Chips, Cookies, Store Bought Juices, Alcohol in Excess of 1-2 drinks a day, Artificial Sweeteners, Coffee Creamer, and Sugar-free Products. This will help patient to have more stable blood glucose profile and potentially avoid unintended weight gain.  - I have approached her with the following individualized plan to manage her diabetes and patient agrees:   -She can stay off her medications at this time.  Will work on lifestyle modifications to manage glucose and avoid hypoglycemia moving forward.   -she is encouraged to start/continue monitoring glucose 4 times daily (using her CGM), before meals and before bed, and to call the clinic if she has readings less than 70 or above 300 for 3 tests in a row.  - Adjustment parameters are given to her for hypo and hyperglycemia in writing.  - she will be considered for incretin therapy as appropriate next visit.  - Specific targets for  A1c; LDL, HDL, and Triglycerides were discussed with the patient.  2) Blood Pressure /Hypertension:  her blood pressure is controlled to target.   she is advised to continue her current medications as prescribed by PCP.  3)  Lipids/Hyperlipidemia:    Review of her recent lipid panel from 06/29/23 showed controlled LDL at 37 .  she is advised to continue Crestor  20 mg daily at bedtime.  Side effects and precautions discussed with her.  4)  Weight/Diet:  her Body mass index is 35.94 kg/m.  -  clearly complicating her diabetes care.   she is  a candidate for weight loss. I discussed with her the fact that loss of 5 - 10% of her  current body weight will have the most impact on her diabetes management.  Exercise, and detailed carbohydrates information provided  -  detailed on discharge instructions.  5) Chronic Care/Health Maintenance: -she is on ACEI/ARB and Statin medications and is encouraged to initiate and continue to follow up with Ophthalmology, Dentist, Podiatrist at least yearly or according to recommendations, and advised to stay away from smoking. I have recommended yearly flu vaccine and pneumonia vaccine at least every 5 years; moderate intensity exercise  for up to 150 minutes weekly; and sleep for at least 7 hours a day.  - she is advised to maintain close follow up with Leonce Lucie PARAS, PA-C for primary care needs, as well as her other providers for optimal and coordinated care.   - Time spent in this patient care: 60 min, which was spent in counseling her about her diabetes and the rest reviewing her blood glucose logs, discussing her hypoglycemia and hyperglycemia episodes, reviewing her current and previous labs/studies (including abstraction from other facilities) and medications doses and developing a long term treatment plan based on the latest standards of care/guidelines; and documenting her care.    Please refer to Patient Instructions for Blood Glucose Monitoring and Insulin /Medications Dosing Guide in media tab for additional information. Please also refer to Patient Self Inventory in the Media tab for reviewed elements of pertinent patient history.  Keirstin LOISE Cooper participated in the  discussions, expressed understanding, and voiced agreement with the above plans.  All questions were answered to her satisfaction. she is encouraged to contact clinic should she have any questions or concerns prior to her return visit.     Follow up plan: - Return in about 4 months (around 02/19/2024) for Diabetes F/U with A1c in office, No previsit labs.    Benton Rio, [facility] [address] [place], [place] [address] Phone: [phone] Fax: [phone]  10/19/2023, 11:21 AM

## 2023-11-26 ENCOUNTER — Ambulatory Visit
Admission: EM | Admit: 2023-11-26 | Discharge: 2023-11-26 | Disposition: A | Discharge status: Home / Self Care | Attending: Family Medicine | Admitting: Family Medicine

## 2023-11-26 DIAGNOSIS — J069 Acute upper respiratory infection, unspecified: Secondary | ICD-10-CM

## 2023-11-26 DIAGNOSIS — J449 Chronic obstructive pulmonary disease, unspecified: Secondary | ICD-10-CM | POA: Diagnosis not present

## 2023-11-26 DIAGNOSIS — U071 COVID-19: Secondary | ICD-10-CM | POA: Diagnosis not present

## 2023-11-26 DIAGNOSIS — R030 Elevated blood-pressure reading, without diagnosis of hypertension: Secondary | ICD-10-CM

## 2023-11-26 LAB — POC SOFIA SARS ANTIGEN FIA: SARS Coronavirus 2 Ag: POSITIVE — AB

## 2023-11-26 MED ORDER — AZELASTINE HCL 0.1 % NA SOLN: 1.0000 | Freq: Two times a day (BID) | NASAL | 0 refills | Status: AC

## 2023-11-26 MED ORDER — BENZONATATE 200 MG PO CAPS: 200.0000 mg | ORAL_CAPSULE | Freq: Three times a day (TID) | ORAL | 0 refills | Status: AC | PRN

## 2023-11-26 NOTE — ED Triage Notes (Signed)
 Pt presents to UC for c/o headache, body aches, and feeling like I had a low grade temperature starting yesterday. Pt took tylenol  cold and flu

## 2023-11-26 NOTE — Discharge Instructions (Addendum)
 You tested positive for COVID-19 today.  This is a virus and is treated much like all other respiratory viruses with good supportive home care such as drinking plenty of fluids, getting plenty of rest.  You may also take Coricidin HBP which is a medication that is found over-the-counter for cold and congestion that is safe for high blood pressure patients, and you may use saline sinus rinses in addition to the cough medicine and nasal spray that I have sent over.  Use your albuterol  inhaler if needed if having any chest tightness, wheezing or worsening cough given your history of COPD.  Follow-up for significantly worsening symptoms.

## 2023-11-26 NOTE — ED Provider Notes (Signed)
 [facility]    CSN: [id number] Arrival date & time: 11/26/23  9180      History   Chief Complaint No chief complaint on file.   HPI Hannah Cooper is a 68 y.o. female.   Presenting today with 1 day history of headache, body aches, congestion, cough.  Denies fever, chest pain, shortness of breath, abdominal pain, vomiting, diarrhea.  Trying Tylenol  cold and flu with minimal relief.  Niece sick with similar symptoms.  History of COPD, has prescription for albuterol  but states she does not use it.  She states she had a positive home COVID test last night but thinks that she did it wrong.    Past Medical History:  Diagnosis Date   Arthritis    Atypical chest pain    chronic   Cancer (HCC)    bone cancer   Chronic back pain    Chronic diastolic CHF (congestive heart failure) (HCC)    COPD (chronic obstructive pulmonary disease) (HCC)    DDD (degenerative disc disease), cervical    Diabetes mellitus without complication (HCC)    History of radiation therapy 07/16/19-08/22/19   L4 spine ; Dr. 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   Plasma cell disorder 06/10/2019   Lesion of bone of lumbosacral spine 05/28/2019   Left-sided weakness 03/13/2016   Cerebrovascular accident (CVA) (HCC)    Palpitations    COPD (chronic obstructive pulmonary disease) (HCC) 08/20/2014   Dyspnea 12/19/2013   Chronic diastolic CHF (congestive heart failure) (HCC) 07/05/2013    Lower extremity edema 07/05/2013   Hyperlipidemia    Chest pain 07/04/2013   Hypokalemia 09/04/2012   Precordial pain 09/04/2012   Viral meningitis 01/22/2011   PNEUMONIA, LEFT LOWER LOBE 10/10/2006   DISEASE, ACUTE BRONCHOSPASM 10/10/2006   Essential hypertension 05/23/2006   OSTEOARTHRITIS 05/23/2006    Past Surgical History:  Procedure Laterality Date   ABDOMINAL HYSTERECTOMY     BREAST LUMPECTOMY WITH RADIOFREQUENCY TAG IDENTIFICATION Right 01/25/2021   Procedure: BREAST LUMPECTOMY WITH RADIOFREQUENCY TAG IDENTIFICATION;  Surgeon: Kallie Manuelita BROCKS, MD;  Location: [facility];  Service: General;  Laterality: Right;   EXCISION OF BREAST BIOPSY Right 01/25/2021   Procedure: EXCISION OF BREAST BIOPSY;  Surgeon: Kallie Manuelita BROCKS, MD;  Location: [facility];  Service: General;  Laterality: Right;   IR FLUORO GUIDED NEEDLE PLC ASPIRATION/INJECTION LOC  06/19/2019   LEFT HEART CATHETERIZATION WITH CORONARY ANGIOGRAM N/A 09/05/2012   Procedure: LEFT HEART CATHETERIZATION WITH CORONARY ANGIOGRAM;  Surgeon: Deatrice DELENA Cage, MD;  Location: [facility];  Service: Cardiovascular;  Laterality: N/A;   RESECTION DISTAL CLAVICAL Left 07/03/2020   Procedure: RESECTION DISTAL CLAVICAL;  Surgeon: Margrette Taft BRAVO, MD;  Location: [facility];  Service: Orthopedics;  Laterality: Left;   SHOULDER OPEN ROTATOR CUFF REPAIR Left 07/03/2020   Procedure: ROTATOR CUFF REPAIR SHOULDER OPEN WITH CHROMEOPLASTY;  Surgeon: Margrette Taft BRAVO, MD;  Location: [facility];  Service: Orthopedics;  Laterality: Left;   SHOULDER OPEN ROTATOR CUFF REPAIR Left 07/02/2021   Procedure: ROTATOR CUFF REPAIR SHOULDER OPEN with Patch Graft;  Surgeon: Margrette Taft BRAVO, MD;  Location: [facility];  Service: Orthopedics;  Laterality: Left;   THYROID  SURGERY  2002    OB History     Gravida      Para      Term      Preterm      AB      Living  0      SAB      IAB      Ectopic      Multiple      Live Births                Home Medications    Prior to Admission medications   Medication Sig Start Date End Date Taking? Authorizing Provider  azelastine  (ASTELIN ) 0.1 % nasal spray Place 1 spray into both nostrils 2 (two) times daily. Use in each nostril as directed 11/26/23  Yes Stuart Vernell Norris, PA-C  benzonatate  (TESSALON ) 200 MG capsule Take 1 capsule (200 mg total) by mouth 3 (three) times daily as needed for cough. 11/26/23  Yes Stuart Vernell Norris, PA-C  albuterol  (PROVENTIL ) (2.5 MG/3ML) 0.083% nebulizer solution Take 2.5 mg by nebulization every 6 (six) hours as needed for wheezing or shortness of breath.    [provider]  albuterol  (VENTOLIN  HFA) 108 (90 Base) MCG/ACT inhaler Inhale 2 puffs into the lungs every 4 (four) hours as needed for wheezing or shortness of breath.    [provider]  cetirizine  (ZYRTEC ) 10 MG tablet Take 10 mg by mouth daily as needed for allergies.    [provider]  chlorthalidone  (HYGROTON ) 50 MG tablet TAKE ONE TABLET BY MOUTH ONCE DAILY. 02/23/23   Alvan Dorn FALCON, MD  Continuous Glucose Sensor (FREESTYLE LIBRE 2 SENSOR) MISC APPLY 1 SENSOR EVERY 14 DAYS 01/25/23   [provider]  diltiazem  (CARDIZEM ) 30 MG tablet Take 1 tablet (30 mg total) by mouth 2 (two) times daily. May take additional 30 mg as needed for palpitations 01/27/23   Alvan Dorn FALCON, MD  furosemide  (LASIX ) 20 MG tablet Take 1 tablet daily as needed may take 2 tablets prn Patient taking differently: as needed. Take 1 tablet daily as needed may take 2 tablets prn 02/17/22   Alvan Dorn FALCON, MD  ibuprofen  (ADVIL ) 800 MG tablet Take 1 tablet (800 mg total) by mouth every 8 (eight) hours as needed. 01/04/21   Margrette Taft BRAVO, MD  isosorbide  mononitrate (IMDUR ) 30 MG 24 hr tablet Take 1 tablet (30 mg total) by mouth daily. 02/17/22   Alvan Dorn FALCON, MD  lidocaine  (LIDODERM ) 5 % Place 2 patches onto the skin daily as needed. Remove & Discard patch within 12  hours or as directed by MD    [provider]  losartan  (COZAAR ) 100 MG tablet TAKE 1 TABLET BY MOUTH DAILY. 02/23/23   Alvan Dorn FALCON, MD  metoprolol  succinate (TOPROL -XL) 50 MG 24 hr tablet TAKE 1 TABLET BY MOUTH DAILY. 02/23/23   Alvan Dorn FALCON, MD  Multiple Vitamin (MULTIVITAMIN WITH MINERALS) TABS tablet Take 1 tablet by mouth daily.    [provider]  nitroGLYCERIN  (NITROSTAT ) 0.4 MG SL tablet Place 1 tablet (0.4 mg total) under the tongue every 5 (five) minutes x 3 doses as needed for chest pain (if no relief after 2nd dose, proceed to the ED or call 911). 01/27/23  Alvan Dorn FALCON, MD  oxyCODONE  (OXY IR/ROXICODONE ) 5 MG immediate release tablet Take 1 tablet (5 mg total) by mouth 2 (two) times daily as needed for severe pain (pain score 7-10) or breakthrough pain. 08/23/23 08/22/24  Rogers Hai, MD  potassium chloride  SA (KLOR-CON  M) 20 MEQ tablet Take 1 tablet (20 mEq total) by mouth daily. Patient taking differently: Take 20 mEq by mouth as needed. 01/27/23   Alvan Dorn FALCON, MD  rosuvastatin  (CRESTOR ) 20 MG tablet Take 1 tablet (20 mg total) by mouth daily. 06/19/23   Alvan Dorn FALCON, MD  TURMERIC PO Take 600 mg by mouth daily. Patient not taking: Reported on 10/19/2023    [provider]    Family History Family History  Problem Relation Age of Onset   Heart attack Mother 85   Heart attack Sister 36   Breast cancer Maternal Grandmother    Breast cancer Cousin     Social History Social History   Tobacco Use   Smoking status: Never    Passive exposure: Never   Smokeless tobacco: Never  Vaping Use   Vaping status: Never Used  Substance Use Topics   Alcohol use: No    Alcohol/week: 0.0 standard drinks of alcohol   Drug use: No     Allergies   Other   Review of Systems Review of Systems Per HPI  Physical Exam Triage Vital Signs ED Triage Vitals  Encounter Vitals Group     BP 11/26/23 0833 (!) 146/93     Girls  Systolic BP Percentile --      Girls Diastolic BP Percentile --      Boys Systolic BP Percentile --      Boys Diastolic BP Percentile --      Pulse Rate 11/26/23 0833 62     Resp 11/26/23 0833 16     Temp 11/26/23 0833 98.7 F (37.1 C)     Temp Source 11/26/23 0833 Oral     SpO2 11/26/23 0833 96 %     Weight --      Height --      Head Circumference --      Peak Flow --      Pain Score 11/26/23 0834 0     Pain Loc --      Pain Education --      Exclude from Growth Chart --    No data found.  Updated Vital Signs BP (!) 146/93 (BP Location: Right Arm)   Pulse 62   Temp 98.7 F (37.1 C) (Oral)   Resp 16   SpO2 96%   Visual Acuity Right Eye Distance:   Left Eye Distance:   Bilateral Distance:    Right Eye Near:   Left Eye Near:    Bilateral Near:     Physical Exam Vitals and nursing note reviewed.  Constitutional:      Appearance: Normal appearance.  HENT:     Head: Atraumatic.     Right Ear: Tympanic membrane and external ear normal.     Left Ear: Tympanic membrane and external ear normal.     Nose: Rhinorrhea present.     Mouth/Throat:     Mouth: Mucous membranes are moist.     Pharynx: Posterior oropharyngeal erythema present.  Eyes:     Extraocular Movements: Extraocular movements intact.     Conjunctiva/sclera: Conjunctivae normal.  Cardiovascular:     Rate and Rhythm: Normal rate and regular rhythm.     Heart sounds: Normal heart  sounds.  Pulmonary:     Effort: Pulmonary effort is normal.     Breath sounds: Normal breath sounds. No wheezing or rales.  Musculoskeletal:        General: Normal range of motion.     Cervical back: Normal range of motion and neck supple.  Skin:    General: Skin is warm and dry.  Neurological:     Mental Status: She is alert and oriented to person, place, and time.  Psychiatric:        Mood and Affect: Mood normal.        Thought Content: Thought content normal.      UC Treatments / Results  Labs (all labs ordered  are listed, but only abnormal results are displayed) Labs Reviewed  POC SOFIA SARS ANTIGEN FIA - Abnormal; Notable for the following components:      Result Value   SARS Coronavirus 2 Ag Positive (*)    All other components within normal limits    EKG   Radiology No results found.  Procedures Procedures (including critical care time)  Medications Ordered in UC Medications - No data to display  Initial Impression / Assessment and Plan / UC Course  I have reviewed the triage vital signs and the nursing notes.  Pertinent labs & imaging results that were available during my care of the patient were reviewed by me and considered in my medical decision making (see chart for details).     Mildly hypertensive in triage, otherwise vital signs reassuring.  Rapid COVID positive.  Reviewed safe over-the-counter medications that will not elevate blood pressure to treat symptoms at this course and HBP, saline sinus rinses and will prescribe Tessalon , Astelin .  Discussed supportive home care and return precautions.  Use albuterol  as needed for COPD  Final Clinical Impressions(s) / UC Diagnoses   Final diagnoses:  Viral URI with cough  COVID-19  Elevated blood pressure reading  Chronic obstructive pulmonary disease, unspecified COPD type (HCC)     Discharge Instructions      You tested positive for COVID-19 today.  This is a virus and is treated much like all other respiratory viruses with good supportive home care such as drinking plenty of fluids, getting plenty of rest.  You may also take Coricidin HBP which is a medication that is found over-the-counter for cold and congestion that is safe for high blood pressure patients, and you may use saline sinus rinses in addition to the cough medicine and nasal spray that I have sent over.  Use your albuterol  inhaler if needed if having any chest tightness, wheezing or worsening cough given your history of COPD.  Follow-up for significantly  worsening symptoms.    ED Prescriptions     Medication Sig Dispense Auth. Provider   azelastine  (ASTELIN ) 0.1 % nasal spray Place 1 spray into both nostrils 2 (two) times daily. Use in each nostril as directed 30 mL Stuart Vernell Norris, PA-C   benzonatate  (TESSALON ) 200 MG capsule Take 1 capsule (200 mg total) by mouth 3 (three) times daily as needed for cough. 20 capsule Stuart Vernell Norris, [place]      PDMP not reviewed this encounter.   Stuart Vernell [place], [place] 11/26/23 [phone]

## 2023-11-30 ENCOUNTER — Encounter (INDEPENDENT_AMBULATORY_CARE_PROVIDER_SITE_OTHER): Payer: Self-pay | Admitting: *Deleted

## 2023-11-30 DIAGNOSIS — R002 Palpitations: Secondary | ICD-10-CM | POA: Diagnosis not present

## 2023-11-30 DIAGNOSIS — E785 Hyperlipidemia, unspecified: Secondary | ICD-10-CM | POA: Diagnosis not present

## 2023-11-30 DIAGNOSIS — J449 Chronic obstructive pulmonary disease, unspecified: Secondary | ICD-10-CM | POA: Diagnosis not present

## 2023-11-30 DIAGNOSIS — I209 Angina pectoris, unspecified: Secondary | ICD-10-CM | POA: Diagnosis not present

## 2023-11-30 DIAGNOSIS — R269 Unspecified abnormalities of gait and mobility: Secondary | ICD-10-CM | POA: Diagnosis not present

## 2023-11-30 DIAGNOSIS — Z008 Encounter for other general examination: Secondary | ICD-10-CM | POA: Diagnosis not present

## 2023-11-30 DIAGNOSIS — Z6838 Body mass index (BMI) 38.0-38.9, adult: Secondary | ICD-10-CM | POA: Diagnosis not present

## 2023-12-04 DIAGNOSIS — J449 Chronic obstructive pulmonary disease, unspecified: Secondary | ICD-10-CM | POA: Diagnosis not present

## 2023-12-07 ENCOUNTER — Encounter (HOSPITAL_COMMUNITY): Payer: Self-pay

## 2023-12-07 ENCOUNTER — Ambulatory Visit (HOSPITAL_COMMUNITY)
Admission: RE | Admit: 2023-12-07 | Discharge: 2023-12-07 | Disposition: A | Discharge status: Home / Self Care | Source: Ambulatory Visit | Attending: Hematology | Admitting: Hematology

## 2023-12-07 DIAGNOSIS — Z1231 Encounter for screening mammogram for malignant neoplasm of breast: Secondary | ICD-10-CM | POA: Diagnosis not present

## 2024-01-04 DIAGNOSIS — J449 Chronic obstructive pulmonary disease, unspecified: Secondary | ICD-10-CM | POA: Diagnosis not present

## 2024-01-17 ENCOUNTER — Other Ambulatory Visit: Payer: Self-pay | Admitting: Cardiology

## 2024-02-03 DIAGNOSIS — J449 Chronic obstructive pulmonary disease, unspecified: Secondary | ICD-10-CM | POA: Diagnosis not present

## 2024-02-07 ENCOUNTER — Other Ambulatory Visit: Payer: Self-pay | Admitting: *Deleted

## 2024-02-07 DIAGNOSIS — C9030 Solitary plasmacytoma not having achieved remission: Secondary | ICD-10-CM

## 2024-02-07 MED ORDER — OXYCODONE HCL 5 MG PO TABS: 5.0000 mg | ORAL_TABLET | Freq: Two times a day (BID) | ORAL | 0 refills | Status: DC | PRN

## 2024-02-21 ENCOUNTER — Encounter: Payer: Self-pay | Admitting: Nurse Practitioner

## 2024-02-21 ENCOUNTER — Ambulatory Visit (INDEPENDENT_AMBULATORY_CARE_PROVIDER_SITE_OTHER): Admitting: Nurse Practitioner

## 2024-02-21 VITALS — BP 102/68 | HR 60 | Ht 65.0 in | Wt 215.0 lb

## 2024-02-21 DIAGNOSIS — I10 Essential (primary) hypertension: Secondary | ICD-10-CM

## 2024-02-21 DIAGNOSIS — E782 Mixed hyperlipidemia: Secondary | ICD-10-CM

## 2024-02-21 DIAGNOSIS — E11649 Type 2 diabetes mellitus with hypoglycemia without coma: Secondary | ICD-10-CM

## 2024-02-21 LAB — POCT GLYCOSYLATED HEMOGLOBIN (HGB A1C): Hemoglobin A1C: 6.8 % — AB (ref 4.0–5.6)

## 2024-02-21 NOTE — Progress Notes (Signed)
 Endocrinology Follow Up Note       02/21/2024, 10:11 AM   Subjective:    Patient ID: Hannah Cooper, female    DOB: 1956/04/05.  Hannah Cooper is being seen in follow up after being seen in consultation for management of currently uncontrolled symptomatic diabetes requested by  Leonce Lucie PARAS, PA-C.   Past Medical History:  Diagnosis Date   Arthritis    Atypical chest pain    chronic   Cancer (HCC)    bone cancer   Chronic back pain    Chronic diastolic CHF (congestive heart failure) (HCC)    COPD (chronic obstructive pulmonary disease) (HCC)    DDD (degenerative disc disease), cervical    Diabetes mellitus without complication (HCC)    History of radiation therapy 07/16/19-08/22/19   L4 spine ; Dr. Lynwood Nasuti   Hyperlipidemia    Hypertension    Lumbar radiculopathy    Meningitis    Normal coronary arteries 2014   Palpitations    Premature atrial contractions    PVC's (premature ventricular contractions)     Past Surgical History:  Procedure Laterality Date   ABDOMINAL HYSTERECTOMY     BREAST BIOPSY Right 2022   Intraductal papilloma   BREAST LUMPECTOMY WITH RADIOFREQUENCY TAG IDENTIFICATION Right 01/25/2021   Procedure: BREAST LUMPECTOMY WITH RADIOFREQUENCY TAG IDENTIFICATION;  Surgeon: Kallie Manuelita BROCKS, MD;  Location: [facility];  Service: General;  Laterality: Right;   EXCISION OF BREAST BIOPSY Right 01/25/2021   Procedure: EXCISION OF BREAST BIOPSY;  Surgeon: Kallie Manuelita BROCKS, MD;  Location: [facility];  Service: General;  Laterality: Right;   IR FLUORO GUIDED NEEDLE PLC ASPIRATION/INJECTION LOC  06/19/2019   LEFT HEART CATHETERIZATION WITH CORONARY ANGIOGRAM N/A 09/05/2012   Procedure: LEFT HEART CATHETERIZATION WITH CORONARY ANGIOGRAM;  Surgeon: Deatrice DELENA Cage, MD;  Location: [facility];  Service: Cardiovascular;  Laterality: N/A;   RESECTION DISTAL CLAVICAL Left 07/03/2020    Procedure: RESECTION DISTAL CLAVICAL;  Surgeon: Margrette Taft BRAVO, MD;  Location: [facility];  Service: Orthopedics;  Laterality: Left;   SHOULDER OPEN ROTATOR CUFF REPAIR Left 07/03/2020   Procedure: ROTATOR CUFF REPAIR SHOULDER OPEN WITH CHROMEOPLASTY;  Surgeon: Margrette Taft BRAVO, MD;  Location: [facility];  Service: Orthopedics;  Laterality: Left;   SHOULDER OPEN ROTATOR CUFF REPAIR Left 07/02/2021   Procedure: ROTATOR CUFF REPAIR SHOULDER OPEN with Patch Graft;  Surgeon: Margrette Taft BRAVO, MD;  Location: [facility];  Service: Orthopedics;  Laterality: Left;   THYROID  SURGERY  04/18/2000    Social History   Socioeconomic History   Marital status: Widowed    Spouse name: Not on file   Number of children: Not on file   Years of education: Not on file   Highest education level: Not on file  Occupational History   Not on file  Tobacco Use   Smoking status: Never    Passive exposure: Never   Smokeless tobacco: Never  Vaping Use   Vaping status: Never Used  Substance and Sexual Activity   Alcohol use: No    Alcohol/week: 0.0 standard drinks of alcohol   Drug use: No   Sexual activity: Yes    Partners: Male  Other Topics Concern   Not on file  Social History Narrative   Not on file   Social Drivers of Health   Financial Resource Strain: Low Risk  (05/28/2019)   Overall Financial Resource Strain (CARDIA)    Difficulty of Paying Living Expenses: Not hard at all  Food Insecurity: No Food Insecurity (05/28/2019)   Hunger Vital Sign    Worried About Running Out of Food in the Last Year: Never true    Ran Out of Food in the Last Year: Never true  Transportation Needs: No Transportation Needs (05/28/2019)   PRAPARE - Administrator, Civil Service (Medical): No    Lack of Transportation (Non-Medical): No  Physical Activity: Inactive (05/28/2019)   Exercise Vital Sign    Days of Exercise per Week: 0 days    Minutes of Exercise per Session: 0 min  Stress: Stress Concern Present  (05/28/2019)   [employer] Occup[employer] - Occupational Stress Questionnaire    Feeling of Stress : To some extent  Social Connections: Unknown (08/31/2021)   [facility]   Social Network    Social Network: Not on file    Family History  Problem Relation Age of Onset   Heart attack Mother 29   Heart attack Sister 54   Breast cancer Maternal Grandmother    Breast cancer Cousin     Outpatient Encounter Medications as of 02/21/2024  Medication Sig   albuterol  (PROVENTIL ) (2.5 MG/3ML) 0.083% nebulizer solution Take 2.5 mg by nebulization every 6 (six) hours as needed for wheezing or shortness of breath.   albuterol  (VENTOLIN  HFA) 108 (90 Base) MCG/ACT inhaler Inhale 2 puffs into the lungs every 4 (four) hours as needed for wheezing or shortness of breath.   azelastine  (ASTELIN ) 0.1 % nasal spray Place 1 spray into both nostrils 2 (two) times daily. Use in each nostril as directed (Patient taking differently: Place 1 spray into both nostrils 2 (two) times daily. Use in each nostril as directed- patient uses as needed)   benzonatate  (TESSALON ) 200 MG capsule Take 1 capsule (200 mg total) by mouth 3 (three) times daily as needed for cough.   cetirizine  (ZYRTEC ) 10 MG tablet Take 10 mg by mouth daily as needed for allergies.   chlorthalidone  (HYGROTON ) 50 MG tablet TAKE ONE TABLET BY MOUTH ONCE DAILY.   Continuous Glucose Sensor (FREESTYLE LIBRE 2 SENSOR) MISC APPLY 1 SENSOR EVERY 14 DAYS   diltiazem  (CARDIZEM ) 30 MG tablet Take 1 tablet (30 mg total) by mouth 2 (two) times daily. May take additional 30 mg as needed for palpitations   furosemide  (LASIX ) 20 MG tablet Take 1 tablet daily as needed may take 2 tablets prn   ibuprofen  (ADVIL ) 800 MG tablet Take 1 tablet (800 mg total) by mouth every 8 (eight) hours as needed.   isosorbide  mononitrate (IMDUR ) 30 MG 24 hr tablet TAKE (1) TABLET BY MOUTH EACH MORNING.   lidocaine  (LIDODERM ) 5 % Place 2 patches onto the skin  daily as needed. Remove & Discard patch within 12 hours or as directed by MD   losartan  (COZAAR ) 100 MG tablet TAKE 1 TABLET BY MOUTH DAILY.   metoprolol  succinate (TOPROL -XL) 50 MG 24 hr tablet TAKE 1 TABLET BY MOUTH DAILY.   nitroGLYCERIN  (NITROSTAT ) 0.4 MG SL tablet Place 1 tablet (0.4 mg total) under the tongue every 5 (five) minutes x 3 doses as needed for chest pain (if no relief after 2nd dose, proceed to the ED or  call 911).   oxyCODONE  (OXY IR/ROXICODONE ) 5 MG immediate release tablet Take 1 tablet (5 mg total) by mouth 2 (two) times daily as needed for severe pain (pain score 7-10) or breakthrough pain.   potassium chloride  SA (KLOR-CON  M) 20 MEQ tablet Take 1 tablet (20 mEq total) by mouth daily. (Patient taking differently: Take 20 mEq by mouth daily. As needed)   rosuvastatin  (CRESTOR ) 20 MG tablet Take 1 tablet (20 mg total) by mouth daily.   [DISCONTINUED] Multiple Vitamin (MULTIVITAMIN WITH MINERALS) TABS tablet Take 1 tablet by mouth daily.   [DISCONTINUED] TURMERIC PO Take 600 mg by mouth daily. (Patient not taking: Reported on 10/19/2023)   No facility-administered encounter medications on file as of 02/21/2024.    ALLERGIES: Allergies  Allergen Reactions   Other Swelling    Avon lipstick    VACCINATION STATUS: Immunization History  Administered Date(s) Administered   Influenza Whole 01/18/2008   Influenza,inj,Quad PF,6+ Mos 01/05/2015   Pneumococcal Polysaccharide-23 01/05/2015    Diabetes She presents for her follow-up diabetic visit. She has type 2 diabetes mellitus. Onset time: diagnosed at approx ge of 63. Her disease course has been stable. Hypoglycemia symptoms include nervousness/anxiousness, sweats and tremors. There are no diabetic associated symptoms. Hypoglycemia complications include nocturnal hypoglycemia. Symptoms are stable. Diabetic complications include heart disease. Risk factors for coronary artery disease include diabetes mellitus, obesity,  hypertension, family history, post-menopausal and sedentary lifestyle. Current diabetic treatment includes diet. Her weight is decreasing steadily. She is following a generally healthy diet. When asked about meal planning, she reported none. She has not had a previous visit with a dietitian. She rarely participates in exercise. (She presents today with her CGM showing mostly at goal glycemic profile.  Her POCT A1c today is 6.8%, increasing slightly from last visit of 6.6%.  She has been over-indulging in foods at times, but overall has been trying hard not to eat processed sweets.  Analysis of her CGM shows TIR 98%, TAR 0%, TBR 2% with active time of 14% (notes she only scans when she feels funny).) An ACE inhibitor/angiotensin II receptor blocker is being taken. She does not see a podiatrist.Eye exam is current.    Review of systems  Constitutional: + Minimally fluctuating body weight,  current Body mass index is 35.78 kg/m. , no fatigue, no subjective hyperthermia, no subjective hypothermia Eyes: no blurry vision, no xerophthalmia ENT: no sore throat, no nodules palpated in throat, no dysphagia/odynophagia, no hoarseness Cardiovascular: no chest pain, no shortness of breath, no palpitations, no leg swelling Respiratory: no cough, no shortness of breath Gastrointestinal: no nausea/vomiting/diarrhea Musculoskeletal: + chronic back pain Skin: no rashes, no hyperemia Neurological: no tremors, no numbness, no tingling, no dizziness Psychiatric: no depression, no anxiety  Objective:     BP 102/68 (BP Location: Left Arm, Patient Position: Sitting, Cuff Size: Large)   Pulse 60   Ht 5' 5 (1.651 m)   Wt 215 lb (97.5 kg)   BMI 35.78 kg/m   Wt Readings from Last 3 Encounters:  02/21/24 215 lb (97.5 kg)  10/19/23 216 lb (98 kg)  07/04/23 220 lb 9.6 oz (100.1 kg)     BP Readings from Last 3 Encounters:  02/21/24 102/68  11/26/23 (!) 146/93  10/19/23 120/76      Physical Exam-  Limited  Constitutional:  Body mass index is 35.78 kg/m. , not in acute distress, normal state of mind Eyes:  EOMI, no exophthalmos Musculoskeletal: no gross deformities, strength intact in all four extremities,  no gross restriction of joint movements Skin:  no rashes, no hyperemia Neurological: no tremor with outstretched hands   Diabetic Foot Exam - Simple   No data filed      CMP ( most recent) CMP     Component Value Date/Time   NA 139 08/14/2023 1213   K 3.3 (L) 08/14/2023 1213   CL 103 08/14/2023 1213   CO2 26 08/14/2023 1213   GLUCOSE 127 (H) 08/14/2023 1213   BUN 15 08/14/2023 1213   CREATININE 0.88 08/14/2023 1213   CREATININE 0.86 03/18/2020 1114   CALCIUM  9.4 08/14/2023 1213   PROT 7.6 08/14/2023 1213   ALBUMIN 3.8 08/14/2023 1213   AST 30 08/14/2023 1213   ALT 33 08/14/2023 1213   ALKPHOS 68 08/14/2023 1213   BILITOT 0.6 08/14/2023 1213   GFRNONAA >60 08/14/2023 1213     Diabetic Labs (most recent): Lab Results  Component Value Date   HGBA1C 6.8 (A) 02/21/2024   HGBA1C 6.6 (A) 10/19/2023   HGBA1C 6.6 (H) 06/29/2021   MICROALBUR 84.4 (H) 08/05/2022     Lipid Panel ( most recent) Lipid Panel     Component Value Date/Time   CHOL 96 06/29/2023 1002   TRIG 64 06/29/2023 1002   HDL 46 06/29/2023 1002   CHOLHDL 2.1 06/29/2023 1002   VLDL 13 06/29/2023 1002   LDLCALC 37 06/29/2023 1002      Lab Results  Component Value Date   TSH 2.571 08/05/2022   TSH 2.355 05/01/2020   TSH 4.268 10/07/2017   TSH 2.150 05/18/2016   TSH 2.764 01/05/2015   TSH 4.995 (H) 07/05/2013   TSH 2.973 09/20/2012   TSH 3.909 09/04/2012   TSH 4.329 01/18/2008   TSH 4.275 06/29/2007   FREET4 0.97 01/18/2008   FREET4 0.80 (L) 06/29/2007           Assessment & Plan:   1) Type 2 diabetes mellitus with hypoglycemia without coma, without long-term current use of insulin  (HCC) (Primary)  She presents today with her CGM showing mostly at goal glycemic profile.  Her  POCT A1c today is 6.8%, increasing slightly from last visit of 6.6%.  She has been over-indulging in foods at times, but overall has been trying hard not to eat processed sweets.  Analysis of her CGM shows TIR 98%, TAR 0%, TBR 2% with active time of 14% (notes she only scans when she feels funny).  - Hannah Cooper has currently uncontrolled symptomatic type 2 DM since 68 years of age.   -Recent labs reviewed.  - I had a long discussion with her about the progressive nature of diabetes and the pathology behind its complications. -her diabetes is complicated by CVA, CHF and she remains at a high risk for more acute and chronic complications which include CAD, CVA, CKD, retinopathy, and neuropathy. These are all discussed in detail with her.  The following Lifestyle Medicine recommendations according to American College of Lifestyle Medicine [facility]) were discussed and offered to patient and she agrees to start the journey:  A. Whole Foods, Plant-based plate comprising of fruits and vegetables, plant-based proteins, whole-grain carbohydrates was discussed in detail with the patient.   A list for source of those nutrients were also provided to the patient.  Patient will use only water or unsweetened tea for hydration. B.  The need to stay away from risky substances including alcohol, smoking; obtaining 7 to 9 hours of restorative sleep, at least 150 minutes of moderate intensity exercise weekly, the  importance of healthy social connections,  and stress reduction techniques were discussed. C.  A full color page of  Calorie density of various food groups per pound showing examples of each food groups was provided to the patient.  - Nutritional counseling repeated/built upon at each appointment.  - The patient admits there is a room for improvement in their diet and drink choices. -  Suggestion is made for the patient to avoid simple carbohydrates from their diet including Cakes, Sweet Desserts /  Pastries, Ice Cream, Soda (diet and regular), Sweet Tea, Candies, Chips, Cookies, Sweet Pastries, Store Bought Juices, Alcohol in Excess of 1-2 drinks a day, Artificial Sweeteners, Coffee Creamer, and Sugar-free Products. This will help patient to have stable blood glucose profile and potentially avoid unintended weight gain.   - I encouraged the patient to switch to unprocessed or minimally processed complex starch and increased protein intake (animal or plant source), fruits, and vegetables.   - Patient is advised to stick to a routine mealtimes to eat 3 meals a day and avoid unnecessary snacks (to snack only to correct hypoglycemia).  - I have approached her with the following individualized plan to manage her diabetes and patient agrees:   -She can stay off her medications at this time. I did give more suggestions to help keep her off medications.  She admitted to taking a Glipizide at night if glucose was high from eating too much at a church function.  I advised her against that.  -she is encouraged to start/continue monitoring glucose 4 times daily (using her CGM), before meals and before bed, and to call the clinic if she has readings less than 70 or above 300 for 3 tests in a row.  I encouraged her to scan her CGM as often as she can so she can get more data about the foods she is eating and what is happening to her glucose.  - Adjustment parameters are given to her for hypo and hyperglycemia in writing.  - she will be considered for incretin therapy as appropriate next visit.  - Specific targets for  A1c; LDL, HDL, and Triglycerides were discussed with the patient.  2) Blood Pressure /Hypertension:  her blood pressure is controlled to target.   she is advised to continue her current medications as prescribed by PCP.  3) Lipids/Hyperlipidemia:    Review of her recent lipid panel from 06/29/23 showed controlled LDL at 37 .  she is advised to continue Crestor  20 mg daily at bedtime.   Side effects and precautions discussed with her.  Will recheck lipid panel prior to next visit.  4)  Weight/Diet:  her Body mass index is 35.78 kg/m.  -  clearly complicating her diabetes care.   she is  a candidate for weight loss. I discussed with her the fact that loss of 5 - 10% of her  current body weight will have the most impact on her diabetes management.  Exercise, and detailed carbohydrates information provided  -  detailed on discharge instructions.  5) Chronic Care/Health Maintenance: -she is on ACEI/ARB and Statin medications and is encouraged to initiate and continue to follow up with Ophthalmology, Dentist, Podiatrist at least yearly or according to recommendations, and advised to stay away from smoking. I have recommended yearly flu vaccine and pneumonia vaccine at least every 5 years; moderate intensity exercise for up to 150 minutes weekly; and sleep for at least 7 hours a day.  - she is advised to maintain close follow  up with Leonce Lucie PARAS, PA-C for primary care needs, as well as her other providers for optimal and coordinated care.     I spent  48  minutes in the care of the patient today including review of labs from CMP, Lipids, Thyroid  Function, Hematology (current and previous including abstractions from other facilities); face-to-face time discussing  her blood glucose readings/logs, discussing hypoglycemia and hyperglycemia episodes and symptoms, medications doses, her options of short and long term treatment based on the latest standards of care / guidelines;  discussion about incorporating lifestyle medicine;  and documenting the encounter. Risk reduction counseling performed per USPSTF guidelines to reduce obesity and cardiovascular risk factors.     Please refer to Patient Instructions for Blood Glucose Monitoring and Insulin /Medications Dosing Guide  in media tab for additional information. Please  also refer to  Patient Self Inventory in the Media  tab for  reviewed elements of pertinent patient history.  Hannah Cooper participated in the discussions, expressed understanding, and voiced agreement with the above plans.  All questions were answered to her satisfaction. she is encouraged to contact clinic should she have any questions or concerns prior to her return visit.     Follow up plan: - Return in about 4 months (around 06/20/2024) for Diabetes F/U with A1c in office, Previsit labs.    Benton Rio, [facility] [address] [place], [place] [address] Phone: [phone] Fax: [phone]  02/21/2024, 10:11 AM

## 2024-02-22 ENCOUNTER — Inpatient Hospital Stay: Attending: Family Medicine

## 2024-02-29 ENCOUNTER — Inpatient Hospital Stay: Admitting: Oncology

## 2024-03-18 LAB — LAB REPORT - SCANNED
A1c: 7.1
EGFR: 84

## 2024-03-19 ENCOUNTER — Other Ambulatory Visit: Payer: Self-pay | Admitting: *Deleted

## 2024-03-19 DIAGNOSIS — C9030 Solitary plasmacytoma not having achieved remission: Secondary | ICD-10-CM

## 2024-03-19 MED ORDER — OXYCODONE HCL 5 MG PO TABS: 5.0000 mg | ORAL_TABLET | Freq: Two times a day (BID) | ORAL | 0 refills | Status: DC | PRN

## 2024-03-20 ENCOUNTER — Telehealth: Payer: Self-pay | Admitting: *Deleted

## 2024-03-20 NOTE — Telephone Encounter (Signed)
 Received CBCD from PCP for review.  Patient states that she is asymptomatic and is feeling well in general. I advised that she call us  before her follow up in January if she should develop any issues. Verbalized understanding and Delon Hope, NP aware.

## 2024-04-15 ENCOUNTER — Encounter: Payer: Self-pay | Admitting: *Deleted

## 2024-05-08 ENCOUNTER — Inpatient Hospital Stay: Attending: Family Medicine

## 2024-05-15 ENCOUNTER — Inpatient Hospital Stay: Admitting: Oncology

## 2024-05-22 ENCOUNTER — Ambulatory Visit (HOSPITAL_COMMUNITY)
Admission: RE | Admit: 2024-05-22 | Discharge: 2024-05-22 | Disposition: A | Source: Ambulatory Visit | Attending: Hematology | Admitting: Hematology

## 2024-05-22 ENCOUNTER — Inpatient Hospital Stay: Attending: Family Medicine

## 2024-05-22 ENCOUNTER — Other Ambulatory Visit: Payer: Self-pay | Admitting: *Deleted

## 2024-05-22 DIAGNOSIS — D472 Monoclonal gammopathy: Secondary | ICD-10-CM

## 2024-05-22 DIAGNOSIS — C9030 Solitary plasmacytoma not having achieved remission: Secondary | ICD-10-CM

## 2024-05-22 LAB — CBC WITH DIFFERENTIAL/PLATELET
Abs Immature Granulocytes: 0 10*3/uL (ref 0.00–0.07)
Basophils Absolute: 0 10*3/uL (ref 0.0–0.1)
Basophils Relative: 1 %
Eosinophils Absolute: 0.1 10*3/uL (ref 0.0–0.5)
Eosinophils Relative: 4 %
HCT: 36.8 % (ref 36.0–46.0)
Hemoglobin: 12.1 g/dL (ref 12.0–15.0)
Immature Granulocytes: 0 %
Lymphocytes Relative: 37 %
Lymphs Abs: 1.2 10*3/uL (ref 0.7–4.0)
MCH: 29.5 pg (ref 26.0–34.0)
MCHC: 32.9 g/dL (ref 30.0–36.0)
MCV: 89.8 fL (ref 80.0–100.0)
Monocytes Absolute: 0.3 10*3/uL (ref 0.1–1.0)
Monocytes Relative: 10 %
Neutro Abs: 1.5 10*3/uL — ABNORMAL LOW (ref 1.7–7.7)
Neutrophils Relative %: 48 %
Platelets: 291 10*3/uL (ref 150–400)
RBC: 4.1 MIL/uL (ref 3.87–5.11)
RDW: 14.2 % (ref 11.5–15.5)
WBC: 3.2 10*3/uL — ABNORMAL LOW (ref 4.0–10.5)
nRBC: 0 % (ref 0.0–0.2)

## 2024-05-22 LAB — COMPREHENSIVE METABOLIC PANEL WITH GFR
ALT: 24 U/L (ref 0–44)
AST: 24 U/L (ref 15–41)
Albumin: 4.2 g/dL (ref 3.5–5.0)
Alkaline Phosphatase: 86 U/L (ref 38–126)
Anion gap: 12 (ref 5–15)
BUN: 13 mg/dL (ref 8–23)
CO2: 28 mmol/L (ref 22–32)
Calcium: 9.6 mg/dL (ref 8.9–10.3)
Chloride: 105 mmol/L (ref 98–111)
Creatinine, Ser: 0.8 mg/dL (ref 0.44–1.00)
GFR, Estimated: >60 mL/min
Glucose, Bld: 110 mg/dL — ABNORMAL HIGH (ref 70–99)
Potassium: 3.8 mmol/L (ref 3.5–5.1)
Sodium: 145 mmol/L (ref 135–145)
Total Bilirubin: 0.4 mg/dL (ref 0.0–1.2)
Total Protein: 7.7 g/dL (ref 6.5–8.1)

## 2024-05-22 MED ORDER — OXYCODONE HCL 5 MG PO TABS: 5.0000 mg | ORAL_TABLET | Freq: Two times a day (BID) | ORAL | 0 refills | Status: AC | PRN

## 2024-05-23 LAB — KAPPA/LAMBDA LIGHT CHAINS
Kappa free light chain: 42 mg/L — ABNORMAL HIGH (ref 3.3–19.4)
Kappa, lambda light chain ratio: 0.74 (ref 0.26–1.65)
Lambda free light chains: 56.4 mg/L — ABNORMAL HIGH (ref 5.7–26.3)

## 2024-05-24 LAB — IMMUNOFIXATION ELECTROPHORESIS
IgA: 260 mg/dL (ref 87–352)
IgG (Immunoglobin G), Serum: 1847 mg/dL — ABNORMAL HIGH (ref 586–1602)
IgM (Immunoglobulin M), Srm: 151 mg/dL (ref 26–217)
Total Protein ELP: 7.3 g/dL (ref 6.0–8.5)

## 2024-05-29 ENCOUNTER — Inpatient Hospital Stay: Admitting: Oncology

## 2024-06-20 ENCOUNTER — Ambulatory Visit: Admitting: Nurse Practitioner
